# Patient Record
Sex: Female | Born: 1975 | Race: White | Hispanic: No | Smoking: Former smoker
Health system: Southern US, Community
[De-identification: ages and names within clinical notes are randomized; demographics above are authoritative.]

## PROBLEM LIST (undated history)

## (undated) DIAGNOSIS — J45909 Unspecified asthma, uncomplicated: Secondary | ICD-10-CM

## (undated) DIAGNOSIS — R011 Cardiac murmur, unspecified: Secondary | ICD-10-CM

## (undated) DIAGNOSIS — Z87442 Personal history of urinary calculi: Secondary | ICD-10-CM

## (undated) DIAGNOSIS — F32A Depression, unspecified: Secondary | ICD-10-CM

## (undated) DIAGNOSIS — Z8719 Personal history of other diseases of the digestive system: Secondary | ICD-10-CM

## (undated) DIAGNOSIS — R0600 Dyspnea, unspecified: Secondary | ICD-10-CM

## (undated) DIAGNOSIS — M1990 Unspecified osteoarthritis, unspecified site: Secondary | ICD-10-CM

## (undated) DIAGNOSIS — R519 Headache, unspecified: Secondary | ICD-10-CM

## (undated) DIAGNOSIS — C801 Malignant (primary) neoplasm, unspecified: Secondary | ICD-10-CM

## (undated) DIAGNOSIS — I209 Angina pectoris, unspecified: Secondary | ICD-10-CM

## (undated) DIAGNOSIS — D649 Anemia, unspecified: Secondary | ICD-10-CM

## (undated) DIAGNOSIS — K219 Gastro-esophageal reflux disease without esophagitis: Secondary | ICD-10-CM

## (undated) DIAGNOSIS — F4310 Post-traumatic stress disorder, unspecified: Secondary | ICD-10-CM

## (undated) DIAGNOSIS — M797 Fibromyalgia: Secondary | ICD-10-CM

## (undated) DIAGNOSIS — J189 Pneumonia, unspecified organism: Secondary | ICD-10-CM

## (undated) DIAGNOSIS — IMO0002 Reserved for concepts with insufficient information to code with codable children: Secondary | ICD-10-CM

## (undated) DIAGNOSIS — F419 Anxiety disorder, unspecified: Secondary | ICD-10-CM

## (undated) DIAGNOSIS — I82409 Acute embolism and thrombosis of unspecified deep veins of unspecified lower extremity: Secondary | ICD-10-CM

## (undated) DIAGNOSIS — M329 Systemic lupus erythematosus, unspecified: Secondary | ICD-10-CM

## (undated) HISTORY — DX: Anemia, unspecified: D64.9

## (undated) HISTORY — PX: TONSILLECTOMY: SUR1361

## (undated) HISTORY — DX: Malignant (primary) neoplasm, unspecified: C80.1

## (undated) HISTORY — PX: TUBAL LIGATION: SHX77

---

## 2006-06-03 ENCOUNTER — Emergency Department: Payer: Self-pay | Admitting: Internal Medicine

## 2006-06-24 ENCOUNTER — Emergency Department: Payer: Self-pay | Admitting: Emergency Medicine

## 2006-06-29 ENCOUNTER — Emergency Department: Payer: Self-pay | Admitting: Emergency Medicine

## 2006-10-06 ENCOUNTER — Emergency Department: Payer: Self-pay | Admitting: Emergency Medicine

## 2006-10-11 ENCOUNTER — Emergency Department: Payer: Self-pay | Admitting: Emergency Medicine

## 2006-12-19 ENCOUNTER — Observation Stay: Payer: Self-pay | Admitting: Obstetrics and Gynecology

## 2007-01-23 ENCOUNTER — Observation Stay: Payer: Self-pay | Admitting: Obstetrics and Gynecology

## 2007-02-10 ENCOUNTER — Observation Stay: Payer: Self-pay

## 2007-05-24 ENCOUNTER — Emergency Department: Payer: Self-pay | Admitting: Emergency Medicine

## 2008-01-19 ENCOUNTER — Emergency Department: Payer: Self-pay | Admitting: Emergency Medicine

## 2008-03-26 ENCOUNTER — Emergency Department: Payer: Self-pay | Admitting: Emergency Medicine

## 2008-07-04 ENCOUNTER — Emergency Department: Payer: Self-pay | Admitting: Emergency Medicine

## 2009-12-09 ENCOUNTER — Emergency Department: Payer: Self-pay | Admitting: Emergency Medicine

## 2010-08-15 ENCOUNTER — Emergency Department: Payer: Self-pay | Admitting: Emergency Medicine

## 2011-06-24 ENCOUNTER — Emergency Department: Payer: Self-pay | Admitting: Emergency Medicine

## 2011-12-17 ENCOUNTER — Emergency Department: Payer: Self-pay | Admitting: Emergency Medicine

## 2012-01-18 ENCOUNTER — Emergency Department: Payer: Self-pay

## 2012-01-18 LAB — PREGNANCY, URINE: Pregnancy Test, Urine: NEGATIVE m[IU]/mL

## 2012-01-18 LAB — COMPREHENSIVE METABOLIC PANEL
Albumin: 3.6 g/dL (ref 3.4–5.0)
Alkaline Phosphatase: 49 U/L — ABNORMAL LOW (ref 50–136)
Anion Gap: 13 (ref 7–16)
BUN: 8 mg/dL (ref 7–18)
Bilirubin,Total: 0.5 mg/dL (ref 0.2–1.0)
Calcium, Total: 9.1 mg/dL (ref 8.5–10.1)
Chloride: 107 mmol/L (ref 98–107)
Co2: 24 mmol/L (ref 21–32)
Creatinine: 0.94 mg/dL (ref 0.60–1.30)
EGFR (African American): >60
EGFR (Non-African Amer.): >60
Glucose: 78 mg/dL (ref 65–99)
Osmolality: 284 (ref 275–301)
Potassium: 3.9 mmol/L (ref 3.5–5.1)
SGOT(AST): 21 U/L (ref 15–37)
SGPT (ALT): 22 U/L
Sodium: 144 mmol/L (ref 136–145)
Total Protein: 7 g/dL (ref 6.4–8.2)

## 2012-01-18 LAB — URINALYSIS, COMPLETE
Bilirubin,UR: NEGATIVE
Glucose,UR: NEGATIVE mg/dL (ref 0–75)
Ketone: NEGATIVE
Nitrite: NEGATIVE
Ph: 5 (ref 4.5–8.0)
Protein: NEGATIVE
RBC,UR: 12 /HPF (ref 0–5)
Specific Gravity: 1.017 (ref 1.003–1.030)
Squamous Epithelial: 5
WBC UR: 6 /HPF (ref 0–5)

## 2012-01-18 LAB — TROPONIN I: Troponin-I: <0.02 ng/mL

## 2012-01-18 LAB — CBC
HCT: 42.3 % (ref 35.0–47.0)
HGB: 14.1 g/dL (ref 12.0–16.0)
MCH: 29.9 pg (ref 26.0–34.0)
MCHC: 33.4 g/dL (ref 32.0–36.0)
MCV: 90 fL (ref 80–100)
Platelet: 291 10*3/uL (ref 150–440)
RBC: 4.71 10*6/uL (ref 3.80–5.20)
RDW: 13.9 % (ref 11.5–14.5)
WBC: 9.3 10*3/uL (ref 3.6–11.0)

## 2012-01-18 LAB — TSH: Thyroid Stimulating Horm: 0.927 u[IU]/mL

## 2012-02-02 ENCOUNTER — Emergency Department: Payer: Self-pay | Admitting: Emergency Medicine

## 2012-03-26 ENCOUNTER — Emergency Department: Payer: Self-pay | Admitting: *Deleted

## 2012-07-30 ENCOUNTER — Emergency Department: Payer: Self-pay | Admitting: *Deleted

## 2013-10-03 ENCOUNTER — Emergency Department: Payer: Self-pay | Admitting: Internal Medicine

## 2014-03-25 ENCOUNTER — Emergency Department: Payer: Self-pay | Admitting: Emergency Medicine

## 2014-03-25 LAB — COMPREHENSIVE METABOLIC PANEL
Albumin: 3.7 g/dL (ref 3.4–5.0)
Alkaline Phosphatase: 58 U/L
Anion Gap: 10 (ref 7–16)
BUN: 11 mg/dL (ref 7–18)
Bilirubin,Total: 0.9 mg/dL (ref 0.2–1.0)
Calcium, Total: 9 mg/dL (ref 8.5–10.1)
Chloride: 104 mmol/L (ref 98–107)
Co2: 26 mmol/L (ref 21–32)
Creatinine: 1.01 mg/dL (ref 0.60–1.30)
EGFR (African American): >60
EGFR (Non-African Amer.): >60
Glucose: 89 mg/dL (ref 65–99)
Osmolality: 278 (ref 275–301)
Potassium: 3.2 mmol/L — ABNORMAL LOW (ref 3.5–5.1)
SGOT(AST): 13 U/L — ABNORMAL LOW (ref 15–37)
SGPT (ALT): 13 U/L (ref 12–78)
Sodium: 140 mmol/L (ref 136–145)
Total Protein: 7.5 g/dL (ref 6.4–8.2)

## 2014-03-25 LAB — URINALYSIS, COMPLETE
Bacteria: NONE SEEN
Bilirubin,UR: NEGATIVE
Glucose,UR: NEGATIVE mg/dL (ref 0–75)
Nitrite: NEGATIVE
Ph: 6 (ref 4.5–8.0)
Protein: 30
RBC,UR: 149 /HPF (ref 0–5)
Specific Gravity: 1.019 (ref 1.003–1.030)
Squamous Epithelial: 1
WBC UR: 3 /HPF (ref 0–5)

## 2014-03-25 LAB — CBC
HCT: 44.3 % (ref 35.0–47.0)
HGB: 14.7 g/dL (ref 12.0–16.0)
MCV: 92 fL (ref 80–100)
Platelet: 287 10*3/uL (ref 150–440)
RBC: 4.82 10*6/uL (ref 3.80–5.20)
WBC: 14.7 10*3/uL — ABNORMAL HIGH (ref 3.6–11.0)

## 2014-04-08 ENCOUNTER — Emergency Department: Payer: Self-pay | Admitting: Emergency Medicine

## 2014-04-08 LAB — CBC
HCT: 42.8 % (ref 35.0–47.0)
HGB: 14.3 g/dL (ref 12.0–16.0)
MCH: 30.7 pg (ref 26.0–34.0)
MCHC: 33.3 g/dL (ref 32.0–36.0)
MCV: 92 fL (ref 80–100)
Platelet: 330 10*3/uL (ref 150–440)
RBC: 4.65 10*6/uL (ref 3.80–5.20)
RDW: 14 % (ref 11.5–14.5)
WBC: 8.2 10*3/uL (ref 3.6–11.0)

## 2014-04-08 LAB — URINALYSIS, COMPLETE
Bilirubin,UR: NEGATIVE
Glucose,UR: NEGATIVE mg/dL (ref 0–75)
Ketone: NEGATIVE
Nitrite: NEGATIVE
Ph: 5 (ref 4.5–8.0)
Protein: NEGATIVE
RBC,UR: 179 /HPF (ref 0–5)
Specific Gravity: 1.021 (ref 1.003–1.030)
Squamous Epithelial: 21
WBC UR: 13 /HPF (ref 0–5)

## 2014-04-08 LAB — BASIC METABOLIC PANEL
Anion Gap: 4 — ABNORMAL LOW (ref 7–16)
BUN: 9 mg/dL (ref 7–18)
Calcium, Total: 8.9 mg/dL (ref 8.5–10.1)
Chloride: 107 mmol/L (ref 98–107)
Co2: 29 mmol/L (ref 21–32)
Creatinine: 1.14 mg/dL (ref 0.60–1.30)
EGFR (African American): >60
EGFR (Non-African Amer.): >60
Glucose: 105 mg/dL — ABNORMAL HIGH (ref 65–99)
Osmolality: 278 (ref 275–301)
Potassium: 3.4 mmol/L — ABNORMAL LOW (ref 3.5–5.1)
Sodium: 140 mmol/L (ref 136–145)

## 2014-04-08 LAB — PREGNANCY, URINE: Pregnancy Test, Urine: NEGATIVE m[IU]/mL

## 2014-10-05 ENCOUNTER — Emergency Department: Payer: Self-pay | Admitting: Emergency Medicine

## 2015-02-11 ENCOUNTER — Emergency Department
Admit: 2015-02-11 | Disposition: A | Discharge status: Home / Self Care | Payer: Self-pay | Admitting: Emergency Medicine

## 2015-02-11 LAB — COMPREHENSIVE METABOLIC PANEL
Albumin: 3.9 g/dL
Alkaline Phosphatase: 40 U/L
Anion Gap: 7 (ref 7–16)
BUN: 8 mg/dL
Bilirubin,Total: 1 mg/dL
Calcium, Total: 8.9 mg/dL
Chloride: 105 mmol/L
Co2: 27 mmol/L
Creatinine: 0.79 mg/dL
EGFR (African American): >60
EGFR (Non-African Amer.): >60
Glucose: 96 mg/dL
Potassium: 3.3 mmol/L — ABNORMAL LOW
SGOT(AST): 17 U/L
SGPT (ALT): 10 U/L — ABNORMAL LOW
Sodium: 139 mmol/L
Total Protein: 7 g/dL

## 2015-02-11 LAB — URINALYSIS, COMPLETE
Bacteria: NONE SEEN
Bilirubin,UR: NEGATIVE
Blood: NEGATIVE
Glucose,UR: NEGATIVE mg/dL (ref 0–75)
Ketone: NEGATIVE
Leukocyte Esterase: NEGATIVE
Nitrite: NEGATIVE
Ph: 6 (ref 4.5–8.0)
Protein: NEGATIVE
RBC,UR: 1 /HPF (ref 0–5)
Specific Gravity: 1.011 (ref 1.003–1.030)
Squamous Epithelial: 4
WBC UR: 5 /HPF (ref 0–5)

## 2015-02-11 LAB — CBC WITH DIFFERENTIAL/PLATELET
Basophil #: 0.1 10*3/uL (ref 0.0–0.1)
Basophil %: 0.8 %
Eosinophil #: 0.1 10*3/uL (ref 0.0–0.7)
Eosinophil %: 0.7 %
HCT: 41.6 % (ref 35.0–47.0)
HGB: 14 g/dL (ref 12.0–16.0)
Lymphocyte #: 2 10*3/uL (ref 1.0–3.6)
Lymphocyte %: 23 %
MCH: 30.1 pg (ref 26.0–34.0)
MCHC: 33.5 g/dL (ref 32.0–36.0)
MCV: 90 fL (ref 80–100)
Monocyte #: 0.7 x10 3/mm (ref 0.2–0.9)
Monocyte %: 7.6 %
Neutrophil #: 5.8 10*3/uL (ref 1.4–6.5)
Neutrophil %: 67.9 %
Platelet: 310 10*3/uL (ref 150–440)
RBC: 4.63 10*6/uL (ref 3.80–5.20)
RDW: 13.9 % (ref 11.5–14.5)
WBC: 8.6 10*3/uL (ref 3.6–11.0)

## 2015-02-11 LAB — PREGNANCY, URINE: Pregnancy Test, Urine: NEGATIVE m[IU]/mL

## 2015-03-06 ENCOUNTER — Emergency Department
Admit: 2015-03-06 | Disposition: A | Discharge status: Home / Self Care | Payer: Self-pay | Admitting: Emergency Medicine

## 2015-03-06 LAB — CBC WITH DIFFERENTIAL/PLATELET
Basophil #: 0.1 10*3/uL (ref 0.0–0.1)
Basophil %: 0.9 %
Eosinophil #: 0 10*3/uL (ref 0.0–0.7)
Eosinophil %: 0.5 %
HCT: 42.4 % (ref 35.0–47.0)
HGB: 14.1 g/dL (ref 12.0–16.0)
Lymphocyte #: 0.5 10*3/uL — ABNORMAL LOW (ref 1.0–3.6)
Lymphocyte %: 9.3 %
MCH: 29.7 pg (ref 26.0–34.0)
MCHC: 33.1 g/dL (ref 32.0–36.0)
MCV: 90 fL (ref 80–100)
Monocyte #: 0.4 x10 3/mm (ref 0.2–0.9)
Monocyte %: 7.6 %
Neutrophil #: 4.8 10*3/uL (ref 1.4–6.5)
Neutrophil %: 81.7 %
Platelet: 252 10*3/uL (ref 150–440)
RBC: 4.73 10*6/uL (ref 3.80–5.20)
RDW: 14.1 % (ref 11.5–14.5)
WBC: 5.8 10*3/uL (ref 3.6–11.0)

## 2015-03-06 LAB — COMPREHENSIVE METABOLIC PANEL
Albumin: 4.2 g/dL
Alkaline Phosphatase: 46 U/L
Anion Gap: 9 (ref 7–16)
BUN: 5 mg/dL — ABNORMAL LOW
Bilirubin,Total: 0.8 mg/dL
Calcium, Total: 8.8 mg/dL — ABNORMAL LOW
Chloride: 107 mmol/L
Co2: 25 mmol/L
Creatinine: 0.89 mg/dL
EGFR (African American): >60
EGFR (Non-African Amer.): >60
Glucose: 83 mg/dL
Potassium: 3.2 mmol/L — ABNORMAL LOW
SGOT(AST): 15 U/L
SGPT (ALT): 14 U/L
Sodium: 141 mmol/L
Total Protein: 7.4 g/dL

## 2015-03-06 LAB — URINALYSIS, COMPLETE
Bacteria: NONE SEEN
Bilirubin,UR: NEGATIVE
Blood: NEGATIVE
Glucose,UR: NEGATIVE mg/dL (ref 0–75)
Ketone: NEGATIVE
Leukocyte Esterase: NEGATIVE
Nitrite: NEGATIVE
Ph: 7 (ref 4.5–8.0)
Protein: NEGATIVE
Specific Gravity: 1.012 (ref 1.003–1.030)

## 2015-03-06 LAB — ED INFLUENZA
Influenza A By PCR: POSITIVE
Influenza B By PCR: NEGATIVE

## 2015-05-03 ENCOUNTER — Emergency Department: Payer: Self-pay

## 2015-05-03 ENCOUNTER — Encounter: Payer: Self-pay | Admitting: Emergency Medicine

## 2015-05-03 ENCOUNTER — Emergency Department
Admission: EM | Admit: 2015-05-03 | Discharge: 2015-05-04 | Disposition: A | Discharge status: Home / Self Care | Payer: Self-pay | Attending: Emergency Medicine | Admitting: Emergency Medicine

## 2015-05-03 DIAGNOSIS — Y9389 Activity, other specified: Secondary | ICD-10-CM | POA: Insufficient documentation

## 2015-05-03 DIAGNOSIS — S66911A Strain of unspecified muscle, fascia and tendon at wrist and hand level, right hand, initial encounter: Secondary | ICD-10-CM | POA: Insufficient documentation

## 2015-05-03 DIAGNOSIS — S0012XA Contusion of left eyelid and periocular area, initial encounter: Secondary | ICD-10-CM | POA: Insufficient documentation

## 2015-05-03 DIAGNOSIS — W51XXXA Accidental striking against or bumped into by another person, initial encounter: Secondary | ICD-10-CM | POA: Insufficient documentation

## 2015-05-03 DIAGNOSIS — S0083XA Contusion of other part of head, initial encounter: Secondary | ICD-10-CM | POA: Insufficient documentation

## 2015-05-03 DIAGNOSIS — Y998 Other external cause status: Secondary | ICD-10-CM | POA: Insufficient documentation

## 2015-05-03 DIAGNOSIS — S2231XA Fracture of one rib, right side, initial encounter for closed fracture: Secondary | ICD-10-CM | POA: Insufficient documentation

## 2015-05-03 DIAGNOSIS — Y9289 Other specified places as the place of occurrence of the external cause: Secondary | ICD-10-CM | POA: Insufficient documentation

## 2015-05-03 DIAGNOSIS — S6991XA Unspecified injury of right wrist, hand and finger(s), initial encounter: Secondary | ICD-10-CM | POA: Insufficient documentation

## 2015-05-03 HISTORY — DX: Reserved for concepts with insufficient information to code with codable children: IMO0002

## 2015-05-03 HISTORY — DX: Unspecified asthma, uncomplicated: J45.909

## 2015-05-03 HISTORY — DX: Systemic lupus erythematosus, unspecified: M32.9

## 2015-05-03 MED ORDER — OXYCODONE-ACETAMINOPHEN 5-325 MG PO TABS
ORAL_TABLET | ORAL | Status: AC
  Administered 2015-05-03: 1 via ORAL
  Filled 2015-05-03: qty 1

## 2015-05-03 MED ORDER — OXYCODONE-ACETAMINOPHEN 5-325 MG PO TABS
1.0000 | ORAL_TABLET | Freq: Once | ORAL | Status: AC
Start: 2015-05-03 — End: 2015-05-03
  Administered 2015-05-03: 1 via ORAL

## 2015-05-03 NOTE — ED Notes (Signed)
Returned to room.
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
## 2015-05-03 NOTE — ED Notes (Signed)
Patient transported to CT 
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
## 2015-05-04 MED ORDER — OXYCODONE-ACETAMINOPHEN 5-325 MG PO TABS: 1.0000 | ORAL_TABLET | Freq: Four times a day (QID) | ORAL | Status: DC | PRN

## 2015-05-04 NOTE — Discharge Instructions (Signed)
You have nondisplaced rib fractures on the right. Your wrist x-ray and your facial CT are okay . No fractures.  Rib Fracture A rib fracture is a break or crack in one of the bones of the ribs. The ribs are a group of long, curved bones that wrap around your chest and attach to your spine. They protect your lungs and other organs in the chest cavity. A broken or cracked rib is often painful, but most do not cause other problems. Most rib fractures heal on their own over time. However, rib fractures can be more serious if multiple ribs are broken or if broken ribs move out of place and push against other structures. CAUSES   A direct blow to the chest. For example, this could happen during contact sports, a car accident, or a fall against a hard object.  Repetitive movements with high force, such as pitching a baseball or having severe coughing spells. SYMPTOMS   Pain when you breathe in or cough.  Pain when someone presses on the injured area. DIAGNOSIS  Your caregiver will perform a physical exam. Various imaging tests may be ordered to confirm the diagnosis and to look for related injuries. These tests may include a chest X-ray, computed tomography (CT), magnetic resonance imaging (MRI), or a bone scan. TREATMENT  Rib fractures usually heal on their own in 1-3 months. The longer healing period is often associated with a continued cough or other aggravating activities. During the healing period, pain control is very important. Medication is usually given to control pain. Hospitalization or surgery may be needed for more severe injuries, such as those in which multiple ribs are broken or the ribs have moved out of place.  HOME CARE INSTRUCTIONS   Avoid strenuous activity and any activities or movements that cause pain. Be careful during activities and avoid bumping the injured rib.  Gradually increase activity as directed by your caregiver.  Only take over-the-counter or prescription  medications as directed by your caregiver. Do not take other medications without asking your caregiver first.  Apply ice to the injured area for the first 1-2 days after you have been treated or as directed by your caregiver. Applying ice helps to reduce inflammation and pain.  Put ice in a plastic bag.  Place a towel between your skin and the bag.   Leave the ice on for 15-20 minutes at a time, every 2 hours while you are awake.  Perform deep breathing as directed by your caregiver. This will help prevent pneumonia, which is a common complication of a broken rib. Your caregiver may instruct you to:  Take deep breaths several times a day.  Try to cough several times a day, holding a pillow against the injured area.  Use a device called an incentive spirometer to practice deep breathing several times a day.  Drink enough fluids to keep your urine clear or pale yellow. This will help you avoid constipation.   Do not wear a rib belt or binder. These restrict breathing, which can lead to pneumonia.  SEEK IMMEDIATE MEDICAL CARE IF:   You have a fever.   You have difficulty breathing or shortness of breath.   You develop a continual cough, or you cough up thick or bloody sputum.  You feel sick to your stomach (nausea), throw up (vomit), or have abdominal pain.   You have worsening pain not controlled with medications.  MAKE SURE YOU:  Understand these instructions.  Will watch your condition.  Will  get help right away if you are not doing well or get worse. Document Released: 10/31/2005 Document Revised: 07/03/2013 Document Reviewed: 01/02/2013 [facility] Patient Information 2015 [place], [place]. This information is not intended to replace advice given to you by your health care provider. Make sure you discuss any questions you have with your health care provider.

## 2015-05-05 ENCOUNTER — Emergency Department: Payer: Self-pay

## 2015-05-05 ENCOUNTER — Encounter: Payer: Self-pay | Admitting: Emergency Medicine

## 2015-05-05 ENCOUNTER — Emergency Department
Admission: EM | Admit: 2015-05-05 | Discharge: 2015-05-05 | Disposition: A | Discharge status: Home / Self Care | Payer: Self-pay | Attending: Emergency Medicine | Admitting: Emergency Medicine

## 2015-05-05 DIAGNOSIS — Y9289 Other specified places as the place of occurrence of the external cause: Secondary | ICD-10-CM | POA: Insufficient documentation

## 2015-05-05 DIAGNOSIS — S2231XD Fracture of one rib, right side, subsequent encounter for fracture with routine healing: Secondary | ICD-10-CM | POA: Insufficient documentation

## 2015-05-05 DIAGNOSIS — Y9389 Activity, other specified: Secondary | ICD-10-CM | POA: Insufficient documentation

## 2015-05-05 DIAGNOSIS — H1132 Conjunctival hemorrhage, left eye: Secondary | ICD-10-CM | POA: Insufficient documentation

## 2015-05-05 DIAGNOSIS — R52 Pain, unspecified: Secondary | ICD-10-CM

## 2015-05-05 DIAGNOSIS — S20212A Contusion of left front wall of thorax, initial encounter: Secondary | ICD-10-CM | POA: Insufficient documentation

## 2015-05-05 DIAGNOSIS — Y998 Other external cause status: Secondary | ICD-10-CM | POA: Insufficient documentation

## 2015-05-05 DIAGNOSIS — S0012XA Contusion of left eyelid and periocular area, initial encounter: Secondary | ICD-10-CM | POA: Insufficient documentation

## 2015-05-05 MED ORDER — IBUPROFEN 800 MG PO TABS: 800.0000 mg | ORAL_TABLET | Freq: Three times a day (TID) | ORAL | Status: DC | PRN

## 2015-05-05 MED ORDER — KETOROLAC TROMETHAMINE 60 MG/2ML IM SOLN
60.0000 mg | Freq: Once | INTRAMUSCULAR | Status: AC
  Administered 2015-05-05: 60 mg via INTRAMUSCULAR

## 2015-05-05 MED ORDER — OXYCODONE-ACETAMINOPHEN 5-325 MG PO TABS: 1.0000 | ORAL_TABLET | ORAL | Status: DC | PRN

## 2015-05-05 MED ORDER — KETOROLAC TROMETHAMINE 60 MG/2ML IM SOLN
INTRAMUSCULAR | Status: AC
  Administered 2015-05-05: 60 mg via INTRAMUSCULAR
  Filled 2015-05-05: qty 2
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
## 2015-05-05 NOTE — Discharge Instructions (Signed)
Rib Contusion °A rib contusion (bruise) can occur by a blow to the chest or by a fall against a hard object. Usually these will be much better in a couple weeks. If X-rays were taken today and there are no broken bones (fractures), the diagnosis of bruising is made. However, broken ribs may not show up for several days, or may be discovered later on a routine X-ray when signs of healing show up. If this happens to you, it does not mean that something was missed on the X-ray, but simply that it did not show up on the first X-rays. Earlier diagnosis will not usually change the treatment. °HOME CARE INSTRUCTIONS  °· Avoid strenuous activity. Be careful during activities and avoid bumping the injured ribs. Activities that pull on the injured ribs and cause pain should be avoided, if possible. °· For the first day or two, an ice pack used every 20 minutes while awake may be helpful. Put ice in a plastic bag and put a towel between the bag and the skin. °· Eat a normal, well-balanced diet. Drink plenty of fluids to avoid constipation. °· Take deep breaths several times a day to keep lungs free of infection. Try to cough several times a day. Splint the injured area with a pillow while coughing to ease pain. Coughing can help prevent pneumonia. °· Wear a rib belt or binder only if told to do so by your caregiver. If you are wearing a rib belt or binder, you must do the breathing exercises as directed by your caregiver. If not used properly, rib belts or binders restrict breathing which can lead to pneumonia. °· Only take over-the-counter or prescription medicines for pain, discomfort, or fever as directed by your caregiver. °SEEK MEDICAL CARE IF:  °· You or your child has an oral temperature above 102° F (38.9° C). °· Your baby is older than 3 months with a rectal temperature of 100.5° F (38.1° C) or higher for more than 1 day. °· You develop a cough, with thick or bloody sputum. °SEEK IMMEDIATE MEDICAL CARE IF:  °· You  have difficulty breathing. °· You feel sick to your stomach (nausea), have vomiting or belly (abdominal) pain. °· You have worsening pain, not controlled with medications, or there is a change in the location of the pain. °· You develop sweating or radiation of the pain into the arms, jaw or shoulders, or become light headed or faint. °· You or your child has an oral temperature above 102° F (38.9° C), not controlled by medicine. °· Your or your baby is older than 3 months with a rectal temperature of 102° F (38.9° C) or higher. °· Your baby is 3 months old or younger with a rectal temperature of 100.4° F (38° C) or higher. °MAKE SURE YOU:  °· Understand these instructions. °· Will watch your condition. °· Will get help right away if you are not doing well or get worse. °Document Released: 07/26/2001 Document Revised: 02/25/2013 Document Reviewed: 06/18/2008 °ExitCare® Patient Information ©2015 [employer] LLC. This information is not intended to replace advice given to you by your health care provider. Make sure you discuss any questions you have with your health care provider. ° °

## 2015-06-09 ENCOUNTER — Emergency Department
Admission: EM | Admit: 2015-06-09 | Discharge: 2015-06-10 | Disposition: A | Discharge status: Home / Self Care | Payer: Self-pay | Attending: Emergency Medicine | Admitting: Emergency Medicine

## 2015-06-09 ENCOUNTER — Encounter: Payer: Self-pay | Admitting: Emergency Medicine

## 2015-06-09 DIAGNOSIS — Y9289 Other specified places as the place of occurrence of the external cause: Secondary | ICD-10-CM | POA: Insufficient documentation

## 2015-06-09 DIAGNOSIS — Y9389 Activity, other specified: Secondary | ICD-10-CM | POA: Insufficient documentation

## 2015-06-09 DIAGNOSIS — Y998 Other external cause status: Secondary | ICD-10-CM | POA: Insufficient documentation

## 2015-06-09 DIAGNOSIS — W231XXA Caught, crushed, jammed, or pinched between stationary objects, initial encounter: Secondary | ICD-10-CM | POA: Insufficient documentation

## 2015-06-09 DIAGNOSIS — S60221A Contusion of right hand, initial encounter: Secondary | ICD-10-CM | POA: Insufficient documentation
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
## 2015-06-10 ENCOUNTER — Other Ambulatory Visit: Payer: Self-pay

## 2015-06-10 ENCOUNTER — Emergency Department: Payer: Self-pay

## 2015-06-10 MED ORDER — IBUPROFEN 800 MG PO TABS
800.0000 mg | ORAL_TABLET | Freq: Once | ORAL | Status: AC
  Administered 2015-06-10: 800 mg via ORAL

## 2015-06-10 MED ORDER — OXYCODONE-ACETAMINOPHEN 5-325 MG PO TABS
ORAL_TABLET | ORAL | Status: AC
  Administered 2015-06-10: 1 via ORAL
  Filled 2015-06-10: qty 1

## 2015-06-10 MED ORDER — IBUPROFEN 800 MG PO TABS
ORAL_TABLET | ORAL | Status: AC
  Administered 2015-06-10: 800 mg via ORAL
  Filled 2015-06-10: qty 1

## 2015-06-10 MED ORDER — OXYCODONE-ACETAMINOPHEN 5-325 MG PO TABS
1.0000 | ORAL_TABLET | Freq: Once | ORAL | Status: AC
  Administered 2015-06-10: 1 via ORAL

## 2015-06-10 NOTE — Discharge Instructions (Signed)
Contusion °A contusion is a deep bruise. Contusions are the result of an injury that caused bleeding under the skin. The contusion may turn blue, purple, or yellow. Minor injuries will give you a painless contusion, but more severe contusions may stay painful and swollen for a few weeks.  °CAUSES  °A contusion is usually caused by a blow, trauma, or direct force to an area of the body. °SYMPTOMS  °· Swelling and redness of the injured area. °· Bruising of the injured area. °· Tenderness and soreness of the injured area. °· Pain. °DIAGNOSIS  °The diagnosis can be made by taking a history and physical exam. An X-ray, CT scan, or MRI may be needed to determine if there were any associated injuries, such as fractures. °TREATMENT  °Specific treatment will depend on what area of the body was injured. In general, the best treatment for a contusion is resting, icing, elevating, and applying cold compresses to the injured area. Over-the-counter medicines may also be recommended for pain control. Ask your caregiver what the best treatment is for your contusion. °HOME CARE INSTRUCTIONS  °· Put ice on the injured area. °¨ Put ice in a plastic bag. °¨ Place a towel between your skin and the bag. °¨ Leave the ice on for 15-20 minutes, 3-4 times a day, or as directed by your health care provider. °· Only take over-the-counter or prescription medicines for pain, discomfort, or fever as directed by your caregiver. Your caregiver may recommend avoiding anti-inflammatory medicines (aspirin, ibuprofen, and naproxen) for 48 hours because these medicines may increase bruising. °· Rest the injured area. °· If possible, elevate the injured area to reduce swelling. °SEEK IMMEDIATE MEDICAL CARE IF:  °· You have increased bruising or swelling. °· You have pain that is getting worse. °· Your swelling or pain is not relieved with medicines. °MAKE SURE YOU:  °· Understand these instructions. °· Will watch your condition. °· Will get help right  away if you are not doing well or get worse. °Document Released: 08/10/2005 Document Revised: 11/05/2013 Document Reviewed: 09/05/2011 °ExitCare® Patient Information ©2015 Exit[employer], LLC. This information is not intended to replace advice given to you by your health care provider. Make sure you discuss any questions you have with your health care provider. ° °

## 2015-06-10 NOTE — ED Provider Notes (Signed)
[facility] Emergency Department Provider Note  ____________________________________________  Time seen: 12:05 AM  I have reviewed the triage vital signs and the nursing notes.   HISTORY  Chief Complaint Hand Pain      HPI Monica Gonzalez is a 39 y.o. female presents with history of accidentally shutting car door on her right hand today. Patient admits to 7 out of 10 pain at the site with swelling and bruising.     Past Medical History  Diagnosis Date  . Asthma   . Lupus     There are no active problems to display for this patient.   Past Surgical History  Procedure Laterality Date  . Tubal ligation    . Tonsillectomy      Current Outpatient Rx  Name  Route  Sig  Dispense  Refill  . ibuprofen (ADVIL,MOTRIN) 800 MG tablet   Oral   Take 1 tablet (800 mg total) by mouth every 8 (eight) hours as needed.   30 tablet   0   . oxyCODONE-acetaminophen (ROXICET) 5-325 MG per tablet   Oral   Take 1-2 tablets by mouth every 4 (four) hours as needed for severe pain.   15 tablet   0     Allergies Prednisone  No family history on file.  Social History History  Substance Use Topics  . Smoking status: Never Smoker   . Smokeless tobacco: Not on file  . Alcohol Use: Yes    Review of Systems  Constitutional: Negative for fever. Eyes: Negative for visual changes. ENT: Negative for sore throat. Cardiovascular: Negative for chest pain. Respiratory: Negative for shortness of breath. Gastrointestinal: Negative for abdominal pain, vomiting and diarrhea. Genitourinary: Negative for dysuria. Musculoskeletal: Negative for back pain. Positive for right hand pain Skin: Negative for rash. Neurological: Negative for headaches, focal weakness or numbness.   10-point ROS otherwise negative.  ____________________________________________   PHYSICAL EXAM:  VITAL SIGNS: ED Triage Vitals  Enc Vitals Group     BP 06/09/15 2333 110/75 mmHg   Pulse Rate 06/09/15 2333 72     Resp 06/09/15 2333 20     Temp 06/09/15 2333 98 F (36.7 C)     Temp Source 06/09/15 2333 Oral     SpO2 06/09/15 2333 100 %     Weight 06/09/15 2333 147 lb (66.679 kg)     Height 06/09/15 2333 4\' 11"  (1.499 m)     Head Cir --      Peak Flow --      Pain Score 06/09/15 2334 4     Pain Loc --      Pain Edu? --      Excl. in GC? --     Constitutional: Alert and oriented. Well appearing and in no distress. Eyes: Conjunctivae are normal. PERRL. Normal extraocular movements. ENT   Head: Normocephalic and atraumatic.   Nose: No congestion/rhinnorhea.   Mouth/Throat: Mucous membranes are moist.   Neck: No stridor. Cardiovascular: Normal rate, regular rhythm. Normal and symmetric distal pulses are present in all extremities. No murmurs, rubs, or gallops. Respiratory: Normal respiratory effort without tachypnea nor retractions. Breath sounds are clear and equal bilaterally. No wheezes/rales/rhonchi. Gastrointestinal: Soft and nontender. No distention. There is no CVA tenderness. Genitourinary: deferred Musculoskeletal: Positive pain and swelling with ecchymosis to the dorsal aspect of the right hand Neurologic:  Normal speech and language. No gross focal neurologic deficits are appreciated. Speech is normal.  Skin:  Skin is warm, dry and intact. No  rash noted. Psychiatric: Mood and affect are normal. Speech and behavior are normal. Patient exhibits appropriate insight and judgment.     EKG      RADIOLOGY  Right hand x-ray revealed: DG Hand Complete Right (Final result) Result time: 06/10/15 01:09:45   Final result by Rad Results In Interface (06/10/15 01:09:45)   Narrative:   CLINICAL DATA: Pain after injury. Shut hand in car door, now with pain. Pain in the region of the metacarpal phalangeal joint.  EXAM: RIGHT HAND - COMPLETE 3+ VIEW  COMPARISON: Wrist radiographs 05/03/2015  FINDINGS: Comminuted fracture of the distal  ulnar metaphysis. Question early surrounding callus formation. Remote fracture of the ulnar styloid unchanged. Chronic deformity of the distal radius is unchanged. No definite additional fracture of the hand. Artifact noted about the index and middle finger, not repeated per request.  IMPRESSION: 1. Comminuted fracture of the distal ulnar metaphysis. Is question overlies surrounding callus formation, suggesting this may be subacute. 2. No additional acute fracture of the hand.   Electronically Signed By: Rubye Oaks M.D. On: 06/10/2015 01:09        INITIAL IMPRESSION / ASSESSMENT AND PLAN / ED COURSE  Pertinent labs & imaging results that were available during my care of the patient were reviewed by me and considered in my medical decision making (see chart for details).    ____________________________________________   FINAL CLINICAL IMPRESSION(S) / ED DIAGNOSES  Final diagnoses:  Hand contusion, right, initial encounter      Darci Current, MD 06/10/15 [phone]

## 2015-08-15 ENCOUNTER — Encounter: Payer: Self-pay | Admitting: Emergency Medicine

## 2015-08-15 ENCOUNTER — Emergency Department
Admission: EM | Admit: 2015-08-15 | Discharge: 2015-08-15 | Disposition: A | Discharge status: Home / Self Care | Payer: Self-pay | Attending: Emergency Medicine | Admitting: Emergency Medicine

## 2015-08-15 DIAGNOSIS — Z3202 Encounter for pregnancy test, result negative: Secondary | ICD-10-CM | POA: Insufficient documentation

## 2015-08-15 DIAGNOSIS — R51 Headache: Secondary | ICD-10-CM | POA: Insufficient documentation

## 2015-08-15 DIAGNOSIS — R197 Diarrhea, unspecified: Secondary | ICD-10-CM | POA: Insufficient documentation

## 2015-08-15 DIAGNOSIS — R112 Nausea with vomiting, unspecified: Secondary | ICD-10-CM | POA: Insufficient documentation

## 2015-08-15 DIAGNOSIS — H53149 Visual discomfort, unspecified: Secondary | ICD-10-CM | POA: Insufficient documentation

## 2015-08-15 DIAGNOSIS — R519 Headache, unspecified: Secondary | ICD-10-CM

## 2015-08-15 LAB — COMPREHENSIVE METABOLIC PANEL
ALT: 13 U/L — ABNORMAL LOW (ref 14–54)
AST: 20 U/L (ref 15–41)
Albumin: 3.5 g/dL (ref 3.5–5.0)
Alkaline Phosphatase: 38 U/L (ref 38–126)
Anion gap: 3 — ABNORMAL LOW (ref 5–15)
BUN: 8 mg/dL (ref 6–20)
CO2: 28 mmol/L (ref 22–32)
Calcium: 8.4 mg/dL — ABNORMAL LOW (ref 8.9–10.3)
Chloride: 110 mmol/L (ref 101–111)
Creatinine, Ser: 0.94 mg/dL (ref 0.44–1.00)
GFR calc Af Amer: >60 mL/min (ref >60)
GFR calc non Af Amer: >60 mL/min (ref >60)
Glucose, Bld: 92 mg/dL (ref 65–99)
Potassium: 3.8 mmol/L (ref 3.5–5.1)
Sodium: 141 mmol/L (ref 135–145)
Total Bilirubin: 0.7 mg/dL (ref 0.3–1.2)
Total Protein: 6.1 g/dL — ABNORMAL LOW (ref 6.5–8.1)

## 2015-08-15 LAB — URINALYSIS COMPLETE WITH MICROSCOPIC (ARMC ONLY)
Bacteria, UA: NONE SEEN
Bilirubin Urine: NEGATIVE
Glucose, UA: NEGATIVE mg/dL
Ketones, ur: NEGATIVE mg/dL
Leukocytes, UA: NEGATIVE
Nitrite: NEGATIVE
Protein, ur: NEGATIVE mg/dL
Specific Gravity, Urine: 1.023 (ref 1.005–1.030)
pH: 6 (ref 5.0–8.0)

## 2015-08-15 LAB — CBC
HCT: 40.3 % (ref 35.0–47.0)
Hemoglobin: 13.1 g/dL (ref 12.0–16.0)
MCH: 28.8 pg (ref 26.0–34.0)
MCHC: 32.6 g/dL (ref 32.0–36.0)
MCV: 88.3 fL (ref 80.0–100.0)
Platelets: 306 10*3/uL (ref 150–440)
RBC: 4.56 MIL/uL (ref 3.80–5.20)
RDW: 13.7 % (ref 11.5–14.5)
WBC: 7.1 10*3/uL (ref 3.6–11.0)

## 2015-08-15 LAB — LIPASE, BLOOD: Lipase: 26 U/L (ref 22–51)

## 2015-08-15 LAB — PREGNANCY, URINE: Preg Test, Ur: NEGATIVE

## 2015-08-15 MED ORDER — PROCHLORPERAZINE EDISYLATE 5 MG/ML IJ SOLN
10.0000 mg | Freq: Once | INTRAMUSCULAR | Status: AC
  Administered 2015-08-15: 10 mg via INTRAVENOUS
  Filled 2015-08-15: qty 2

## 2015-08-15 MED ORDER — KETOROLAC TROMETHAMINE 30 MG/ML IJ SOLN
30.0000 mg | Freq: Once | INTRAMUSCULAR | Status: AC
  Administered 2015-08-15: 30 mg via INTRAVENOUS
  Filled 2015-08-15: qty 1

## 2015-08-15 MED ORDER — METOCLOPRAMIDE HCL 10 MG PO TABS: 10.0000 mg | ORAL_TABLET | Freq: Four times a day (QID) | ORAL | Status: DC | PRN

## 2015-08-15 MED ORDER — SODIUM CHLORIDE 0.9 % IV BOLUS (SEPSIS)
1000.0000 mL | Freq: Once | INTRAVENOUS | Status: AC
  Administered 2015-08-15: 1000 mL via INTRAVENOUS

## 2015-08-15 MED ORDER — BUTALBITAL-APAP-CAFFEINE 50-325-40 MG PO TABS: 1.0000 | ORAL_TABLET | Freq: Four times a day (QID) | ORAL | Status: AC | PRN

## 2015-08-15 NOTE — ED Notes (Signed)
Pt to ed with c/o headache, vomiting and diarrhea that started about 6 am today.  Pt alert and oriented, reports she has vomited x 7 today, diarrhea x 4 today.  Pt skin warm and dry.  Pt drinking soda at triage.

## 2015-08-15 NOTE — ED Notes (Signed)
Pt c/o Nausea and vomiting with onset of headache with mild sensitivity to light. States headache is unchanged with tylenol and motrin. Constant and throbbing.

## 2015-08-15 NOTE — ED Provider Notes (Signed)
[facility] Emergency Department Provider Note  ____________________________________________  Time seen: Approximately 7 PM  I have reviewed the triage vital signs and the nursing notes.   HISTORY  Chief Complaint Emesis; Diarrhea; and Headache    HPI Monica Gonzalez is a 39 y.o. female with a history of lupus who is presenting today with nausea, vomiting diarrhea and a headache. She says that she has vomited about 7 times and 4 episodes of diarrhea. She denies any blood in her vomit or diarrhea. Says that she is also having a left-sided headache which she describes as throbbing and tight. She describes photophobia with this. Says that the headache started small this morning and is increased to a 5- 6 out of 10.Said she has tried Tylenol as well as ibuprofen earlier today for this without any relief. Says that she has multiple sick contacts at work. Denies any abdominal pain. No pain with urination.   Past Medical History  Diagnosis Date  . Asthma   . Lupus (HCC)     There are no active problems to display for this patient.   Past Surgical History  Procedure Laterality Date  . Tubal ligation    . Tonsillectomy      Current Outpatient Rx  Name  Route  Sig  Dispense  Refill  . ibuprofen (ADVIL,MOTRIN) 800 MG tablet   Oral   Take 1 tablet (800 mg total) by mouth every 8 (eight) hours as needed.   30 tablet   0   . oxyCODONE-acetaminophen (ROXICET) 5-325 MG per tablet   Oral   Take 1-2 tablets by mouth every 4 (four) hours as needed for severe pain.   15 tablet   0     Allergies Prednisone  Family History  Problem Relation Age of Onset  . Diabetes Mother   . Congestive Heart Failure Mother     Social History Social History  Substance Use Topics  . Smoking status: Never Smoker   . Smokeless tobacco: None  . Alcohol Use: Yes    Review of Systems Constitutional: No fever/chills Eyes: No visual changes. ENT: No sore  throat. Cardiovascular: Denies chest pain. Respiratory: Denies shortness of breath. Gastrointestinal: No abdominal pain.   No constipation. Genitourinary: Negative for dysuria. Musculoskeletal: Negative for back pain. Skin: Negative for rash. Neurological: Negative for headaches, focal weakness or numbness.  10-point ROS otherwise negative.  ____________________________________________   PHYSICAL EXAM:  VITAL SIGNS: ED Triage Vitals  Enc Vitals Group     BP 08/15/15 1457 117/58 mmHg     Pulse Rate 08/15/15 1457 66     Resp 08/15/15 1457 20     Temp 08/15/15 1457 98.5 F (36.9 C)     Temp Source 08/15/15 1457 Oral     SpO2 08/15/15 1457 99 %     Weight 08/15/15 1457 146 lb (66.225 kg)     Height 08/15/15 1457  (1.499 m)     Head Cir --      Peak Flow --      Pain Score 08/15/15 1458 5     Pain Loc --      Pain Edu? --      Excl. in GC? --     Constitutional: Alert and oriented. Well appearing and in no acute distress. Eyes: Conjunctivae are normal. PERRL. EOMI. Head: Atraumatic. Nose: No congestion/rhinnorhea. Mouth/Throat: Mucous membranes are moist.  Oropharynx non-erythematous. Neck: No stridor.   Cardiovascular: Normal rate, regular rhythm. Grossly normal heart sounds.  Good peripheral circulation. Respiratory: Normal respiratory effort.  No retractions. Lungs CTAB. Gastrointestinal: Soft and nontender. No distention. No abdominal bruits. No CVA tenderness. Musculoskeletal: No lower extremity tenderness nor edema.  No joint effusions. Neurologic:  Normal speech and language. No gross focal neurologic deficits are appreciated. No gait instability. Skin:  Skin is warm, dry and intact. No rash noted. Psychiatric: Mood and affect are normal. Speech and behavior are normal.  ____________________________________________   LABS (all labs ordered are listed, but only abnormal results are displayed)  Labs Reviewed  COMPREHENSIVE METABOLIC PANEL - Abnormal;  Notable for the following:    Calcium 8.4 (*)    Total Protein 6.1 (*)    ALT 13 (*)    Anion gap 3 (*)    All other components within normal limits  URINALYSIS COMPLETEWITH MICROSCOPIC ([facility] ONLY) - Abnormal; Notable for the following:    Color, Urine YELLOW (*)    APPearance CLEAR (*)    Hgb urine dipstick 2+ (*)    Squamous Epithelial / LPF 0-5 (*)    All other components within normal limits  LIPASE, BLOOD  CBC  PREGNANCY, URINE   ____________________________________________  EKG   ____________________________________________  RADIOLOGY   ____________________________________________   PROCEDURES    ____________________________________________   INITIAL IMPRESSION / ASSESSMENT AND PLAN / ED COURSE  Pertinent labs & imaging results that were available during my care of the patient were reviewed by me and considered in my medical decision making (see chart for details).  ----------------------------------------- 9:10 PM on 08/15/2015 -----------------------------------------  Patient is resting comfortably at this time and says that her headache pain is down to 2 out of 10. She says she is feeling well enough to go home at this time. Has not had any nausea or vomiting during her stay in the emergency department exam room. We'll discharge to home with Reglan as well as Fioricet. I feel that the etiology of her symptoms is viral. Will likely resolve over the next several days. Requesting work note. We'll give working. Unlikely to be intracranial hemorrhage. The patient had gradually increasing symptoms that did not start as a sudden onset, thunderclap quality. Does not of any meningismus on exam. No neurologic deficits. ____________________________________________   FINAL CLINICAL IMPRESSION(S) / ED DIAGNOSES  Acute nausea and vomiting. Acute headache. Initial visit.    Myrna Blazer, MD 08/15/15 2111

## 2015-08-15 NOTE — Discharge Instructions (Signed)
Nausea and Vomiting Nausea means you feel sick to your stomach. Throwing up (vomiting) is a reflex where stomach contents come out of your mouth. HOME CARE   Take medicine as told by your doctor.  Do not force yourself to eat. However, you do need to drink fluids.  If you feel like eating, eat a normal diet as told by your doctor.  Eat rice, wheat, potatoes, bread, lean meats, yogurt, fruits, and vegetables.  Avoid high-fat foods.  Drink enough fluids to keep your pee (urine) clear or pale yellow.  Ask your doctor how to replace body fluid losses (rehydrate). Signs of body fluid loss (dehydration) include:  Feeling very thirsty.  Dry lips and mouth.  Feeling dizzy.  Dark pee.  Peeing less than normal.  Feeling confused.  Fast breathing or heart rate. GET HELP RIGHT AWAY IF:   You have blood in your throw up.  You have black or bloody poop (stool).  You have a bad headache or stiff neck.  You feel confused.  You have bad belly (abdominal) pain.  You have chest pain or trouble breathing.  You do not pee at least once every 8 hours.  You have cold, clammy skin.  You keep throwing up after 24 to 48 hours.  You have a fever. MAKE SURE YOU:   Understand these instructions.  Will watch your condition.  Will get help right away if you are not doing well or get worse. Document Released: 04/18/2008 Document Revised: 01/23/2012 Document Reviewed: 04/01/2011 [facility] Patient Information 2015 [place], [place]. This information is not intended to replace advice given to you by your health care provider. Make sure you discuss any questions you have with your health care provider.  Headaches, Frequently Asked Questions MIGRAINE HEADACHES Q: What is migraine? What causes it? How can I treat it? A: Generally, migraine headaches begin as a dull ache. Then they develop into a constant, throbbing, and pulsating pain. You may experience pain at the temples. You may  experience pain at the front or back of one or both sides of the head. The pain is usually accompanied by a combination of:  Nausea.  Vomiting.  Sensitivity to light and noise. Some people (about 15%) experience an aura (see below) before an attack. The cause of migraine is believed to be chemical reactions in the brain. Treatment for migraine may include over-the-counter or prescription medications. It may also include self-help techniques. These include relaxation training and biofeedback.  Q: What is an aura? A: About 15% of people with migraine get an "aura". This is a sign of neurological symptoms that occur before a migraine headache. You may see wavy or jagged lines, dots, or flashing lights. You might experience tunnel vision or blind spots in one or both eyes. The aura can include visual or auditory hallucinations (something imagined). It may include disruptions in smell (such as strange odors), taste or touch. Other symptoms include:  Numbness.  A "pins and needles" sensation.  Difficulty in recalling or speaking the correct word. These neurological events may last as long as 60 minutes. These symptoms will fade as the headache begins. Q: What is a trigger? A: Certain physical or environmental factors can lead to or "trigger" a migraine. These include:  Foods.  Hormonal changes.  Weather.  Stress. It is important to remember that triggers are different for everyone. To help prevent migraine attacks, you need to figure out which triggers affect you. Keep a headache diary. This is a good way  to track triggers. The diary will help you talk to your healthcare professional about your condition. Q: Does weather affect migraines? A: Bright sunshine, hot, humid conditions, and drastic changes in barometric pressure may lead to, or "trigger," a migraine attack in some people. But studies have shown that weather does not act as a trigger for everyone with migraines. Q: What is the link  between migraine and hormones? A: Hormones start and regulate many of your body's functions. Hormones keep your body in balance within a constantly changing environment. The levels of hormones in your body are unbalanced at times. Examples are during menstruation, pregnancy, or menopause. That can lead to a migraine attack. In fact, about three quarters of all women with migraine report that their attacks are related to the menstrual cycle.  Q: Is there an increased risk of stroke for migraine sufferers? A: The likelihood of a migraine attack causing a stroke is very remote. That is not to say that migraine sufferers cannot have a stroke associated with their migraines. In persons under age 21, the most common associated factor for stroke is migraine headache. But over the course of a person's normal life span, the occurrence of migraine headache may actually be associated with a reduced risk of dying from cerebrovascular disease due to stroke.  Q: What are acute medications for migraine? A: Acute medications are used to treat the pain of the headache after it has started. Examples over-the-counter medications, NSAIDs, ergots, and triptans.  Q: What are the triptans? A: Triptans are the newest class of abortive medications. They are specifically targeted to treat migraine. Triptans are vasoconstrictors. They moderate some chemical reactions in the brain. The triptans work on receptors in your brain. Triptans help to restore the balance of a neurotransmitter called serotonin. Fluctuations in levels of serotonin are thought to be a main cause of migraine.  Q: Are over-the-counter medications for migraine effective? A: Over-the-counter, or "OTC," medications may be effective in relieving mild to moderate pain and associated symptoms of migraine. But you should see your caregiver before beginning any treatment regimen for migraine.  Q: What are preventive medications for migraine? A: Preventive medications  for migraine are sometimes referred to as "prophylactic" treatments. They are used to reduce the frequency, severity, and length of migraine attacks. Examples of preventive medications include antiepileptic medications, antidepressants, beta-blockers, calcium channel blockers, and NSAIDs (nonsteroidal anti-inflammatory drugs). Q: Why are anticonvulsants used to treat migraine? A: During the past few years, there has been an increased interest in antiepileptic drugs for the prevention of migraine. They are sometimes referred to as "anticonvulsants". Both epilepsy and migraine may be caused by similar reactions in the brain.  Q: Why are antidepressants used to treat migraine? A: Antidepressants are typically used to treat people with depression. They may reduce migraine frequency by regulating chemical levels, such as serotonin, in the brain.  Q: What alternative therapies are used to treat migraine? A: The term "alternative therapies" is often used to describe treatments considered outside the scope of conventional Western medicine. Examples of alternative therapy include acupuncture, acupressure, and yoga. Another common alternative treatment is herbal therapy. Some herbs are believed to relieve headache pain. Always discuss alternative therapies with your caregiver before proceeding. Some herbal products contain arsenic and other toxins. TENSION HEADACHES Q: What is a tension-type headache? What causes it? How can I treat it? A: Tension-type headaches occur randomly. They are often the result of temporary stress, anxiety, fatigue, or anger. Symptoms include soreness in  your temples, a tightening band-like sensation around your head (a "vice-like" ache). Symptoms can also include a pulling feeling, pressure sensations, and contracting head and neck muscles. The headache begins in your forehead, temples, or the back of your head and neck. Treatment for tension-type headache may include over-the-counter or  prescription medications. Treatment may also include self-help techniques such as relaxation training and biofeedback. CLUSTER HEADACHES Q: What is a cluster headache? What causes it? How can I treat it? A: Cluster headache gets its name because the attacks come in groups. The pain arrives with little, if any, warning. It is usually on one side of the head. A tearing or bloodshot eye and a runny nose on the same side of the headache may also accompany the pain. Cluster headaches are believed to be caused by chemical reactions in the brain. They have been described as the most severe and intense of any headache type. Treatment for cluster headache includes prescription medication and oxygen. SINUS HEADACHES Q: What is a sinus headache? What causes it? How can I treat it? A: When a cavity in the bones of the face and skull (a sinus) becomes inflamed, the inflammation will cause localized pain. This condition is usually the result of an allergic reaction, a tumor, or an infection. If your headache is caused by a sinus blockage, such as an infection, you will probably have a fever. An x-ray will confirm a sinus blockage. Your caregiver's treatment might include antibiotics for the infection, as well as antihistamines or decongestants.  REBOUND HEADACHES Q: What is a rebound headache? What causes it? How can I treat it? A: A pattern of taking acute headache medications too often can lead to a condition known as "rebound headache." A pattern of taking too much headache medication includes taking it more than 2 days per week or in excessive amounts. That means more than the label or a caregiver advises. With rebound headaches, your medications not only stop relieving pain, they actually begin to cause headaches. Doctors treat rebound headache by tapering the medication that is being overused. Sometimes your caregiver will gradually substitute a different type of treatment or medication. Stopping may be a  challenge. Regularly overusing a medication increases the potential for serious side effects. Consult a caregiver if you regularly use headache medications more than 2 days per week or more than the label advises. ADDITIONAL QUESTIONS AND ANSWERS Q: What is biofeedback? A: Biofeedback is a self-help treatment. Biofeedback uses special equipment to monitor your body's involuntary physical responses. Biofeedback monitors:  Breathing.  Pulse.  Heart rate.  Temperature.  Muscle tension.  Brain activity. Biofeedback helps you refine and perfect your relaxation exercises. You learn to control the physical responses that are related to stress. Once the technique has been mastered, you do not need the equipment any more. Q: Are headaches hereditary? A: Four out of five (80%) of people that suffer report a family history of migraine. Scientists are not sure if this is genetic or a family predisposition. Despite the uncertainty, a child has a 50% chance of having migraine if one parent suffers. The child has a 75% chance if both parents suffer.  Q: Can children get headaches? A: By the time they reach high school, most young people have experienced some type of headache. Many safe and effective approaches or medications can prevent a headache from occurring or stop it after it has begun.  Q: What type of doctor should I see to diagnose and treat my headache?  A: Start with your primary caregiver. Discuss his or her experience and approach to headaches. Discuss methods of classification, diagnosis, and treatment. Your caregiver may decide to recommend you to a headache specialist, depending upon your symptoms or other physical conditions. Having diabetes, allergies, etc., may require a more comprehensive and inclusive approach to your headache. The National Head[employer] Foundation will provide, upon request, a list of [facility] physician members in your state. Document Released: 01/21/2004 Document Revised:  01/23/2012 Document Reviewed: 06/30/2008 [facility] Patient Information 2015 [place], [place]. This information is not intended to replace advice given to you by your health care provider. Make sure you discuss any questions you have with your health care provider.

## 2016-04-12 ENCOUNTER — Emergency Department: Payer: PRIVATE HEALTH INSURANCE

## 2016-04-12 ENCOUNTER — Emergency Department
Admission: EM | Admit: 2016-04-12 | Discharge: 2016-04-12 | Disposition: A | Discharge status: Home / Self Care | Payer: PRIVATE HEALTH INSURANCE | Attending: Emergency Medicine | Admitting: Emergency Medicine

## 2016-04-12 DIAGNOSIS — S0993XA Unspecified injury of face, initial encounter: Secondary | ICD-10-CM | POA: Diagnosis present

## 2016-04-12 DIAGNOSIS — Y939 Activity, unspecified: Secondary | ICD-10-CM | POA: Diagnosis not present

## 2016-04-12 DIAGNOSIS — J45909 Unspecified asthma, uncomplicated: Secondary | ICD-10-CM | POA: Diagnosis not present

## 2016-04-12 DIAGNOSIS — L0201 Cutaneous abscess of face: Secondary | ICD-10-CM | POA: Diagnosis not present

## 2016-04-12 DIAGNOSIS — S0083XA Contusion of other part of head, initial encounter: Secondary | ICD-10-CM | POA: Diagnosis not present

## 2016-04-12 DIAGNOSIS — R220 Localized swelling, mass and lump, head: Secondary | ICD-10-CM | POA: Diagnosis not present

## 2016-04-12 DIAGNOSIS — W010XXA Fall on same level from slipping, tripping and stumbling without subsequent striking against object, initial encounter: Secondary | ICD-10-CM | POA: Insufficient documentation

## 2016-04-12 DIAGNOSIS — Y999 Unspecified external cause status: Secondary | ICD-10-CM | POA: Insufficient documentation

## 2016-04-12 DIAGNOSIS — Y929 Unspecified place or not applicable: Secondary | ICD-10-CM | POA: Diagnosis not present

## 2016-04-12 DIAGNOSIS — W19XXXA Unspecified fall, initial encounter: Secondary | ICD-10-CM

## 2016-04-12 MED ORDER — CLINDAMYCIN HCL 150 MG PO CAPS
300.0000 mg | ORAL_CAPSULE | Freq: Once | ORAL | Status: AC
  Administered 2016-04-12: 300 mg via ORAL
  Filled 2016-04-12: qty 2

## 2016-04-12 MED ORDER — CLINDAMYCIN HCL 300 MG PO CAPS: 300.0000 mg | ORAL_CAPSULE | Freq: Three times a day (TID) | ORAL | Status: DC

## 2016-04-12 MED ORDER — TRAMADOL HCL 50 MG PO TABS: 50.0000 mg | ORAL_TABLET | Freq: Four times a day (QID) | ORAL | Status: DC | PRN

## 2016-04-12 MED ORDER — KETOROLAC TROMETHAMINE 60 MG/2ML IM SOLN
60.0000 mg | Freq: Once | INTRAMUSCULAR | Status: AC
  Administered 2016-04-12: 60 mg via INTRAMUSCULAR
  Filled 2016-04-12: qty 2

## 2016-04-12 NOTE — Discharge Instructions (Signed)
Contusion A contusion is a deep bruise. Contusions are the result of a blunt injury to tissues and muscle fibers under the skin. The injury causes bleeding under the skin. The skin overlying the contusion may turn blue, purple, or yellow. Minor injuries will give you a painless contusion, but more severe contusions may stay painful and swollen for a few weeks.  CAUSES  This condition is usually caused by a blow, trauma, or direct force to an area of the body. SYMPTOMS  Symptoms of this condition include:  Swelling of the injured area.  Pain and tenderness in the injured area.  Discoloration. The area may have redness and then turn blue, purple, or yellow. DIAGNOSIS  This condition is diagnosed based on a physical exam and medical history. An X-ray, CT scan, or MRI may be needed to determine if there are any associated injuries, such as broken bones (fractures). TREATMENT  Specific treatment for this condition depends on what area of the body was injured. In general, the best treatment for a contusion is resting, icing, applying pressure to (compression), and elevating the injured area. This is often called the RICE strategy. Over-the-counter anti-inflammatory medicines may also be recommended for pain control.  HOME CARE INSTRUCTIONS   Rest the injured area.  If directed, apply ice to the injured area:  Put ice in a plastic bag.  Place a towel between your skin and the bag.  Leave the ice on for 20 minutes, 2-3 times per day.  If directed, apply light compression to the injured area using an elastic bandage. Make sure the bandage is not wrapped too tightly. Remove and reapply the bandage as directed by your health care provider.  If possible, raise (elevate) the injured area above the level of your heart while you are sitting or lying down.  Take over-the-counter and prescription medicines only as told by your health care provider. SEEK MEDICAL CARE IF:  Your symptoms do not  improve after several days of treatment.  Your symptoms get worse.  You have difficulty moving the injured area. SEEK IMMEDIATE MEDICAL CARE IF:   You have severe pain.  You have numbness in a hand or foot.  Your hand or foot turns pale or cold.   This information is not intended to replace advice given to you by your health care provider. Make sure you discuss any questions you have with your health care provider.   Document Released: 08/10/2005 Document Revised: 07/22/2015 Document Reviewed: 03/18/2015 Elsevier Interactive Patient Education 2016 [employer]  Abscess An abscess is an infected area that contains a collection of pus and debris.It can occur in almost any part of the body. An abscess is also known as a furuncle or boil. CAUSES  An abscess occurs when tissue gets infected. This can occur from blockage of oil or sweat glands, infection of hair follicles, or a minor injury to the skin. As the body tries to fight the infection, pus collects in the area and creates pressure under the skin. This pressure causes pain. People with weakened immune systems have difficulty fighting infections and get certain abscesses more often.  SYMPTOMS Usually an abscess develops on the skin and becomes a painful mass that is red, warm, and tender. If the abscess forms under the skin, you may feel a moveable soft area under the skin. Some abscesses break open (rupture) on their own, but most will continue to get worse without care. The infection can spread deeper into the body and eventually into  the bloodstream, causing you to feel ill.  DIAGNOSIS  Your caregiver will take your medical history and perform a physical exam. A sample of fluid may also be taken from the abscess to determine what is causing your infection. TREATMENT  Your caregiver may prescribe antibiotic medicines to fight the infection. However, taking antibiotics alone usually does not cure an abscess. Your caregiver may need to  make a small cut (incision) in the abscess to drain the pus. In some cases, gauze is packed into the abscess to reduce pain and to continue draining the area. HOME CARE INSTRUCTIONS   Only take over-the-counter or prescription medicines for pain, discomfort, or fever as directed by your caregiver.  If you were prescribed antibiotics, take them as directed. Finish them even if you start to feel better.  If gauze is used, follow your caregiver's directions for changing the gauze.  To avoid spreading the infection:  Keep your draining abscess covered with a bandage.  Wash your hands well.  Do not share personal care items, towels, or whirlpools with others.  Avoid skin contact with others.  Keep your skin and clothes clean around the abscess.  Keep all follow-up appointments as directed by your caregiver. SEEK MEDICAL CARE IF:   You have increased pain, swelling, redness, fluid drainage, or bleeding.  You have muscle aches, chills, or a general ill feeling.  You have a fever. MAKE SURE YOU:   Understand these instructions.  Will watch your condition.  Will get help right away if you are not doing well or get worse.   This information is not intended to replace advice given to you by your health care provider. Make sure you discuss any questions you have with your health care provider.   Document Released: 08/10/2005 Document Revised: 05/01/2012 Document Reviewed: 01/13/2012 Elsevier Interactive Patient Education 2016 [employer]  Facial or Scalp Contusion A facial or scalp contusion is a deep bruise on the face or head. Injuries to the face and head generally cause a lot of swelling, especially around the eyes. Contusions are the result of an injury that caused bleeding under the skin. The contusion may turn blue, purple, or yellow. Minor injuries will give you a painless contusion, but more severe contusions may stay painful and swollen for a few weeks.  CAUSES  A facial  or scalp contusion is caused by a blunt injury or trauma to the face or head area.  SIGNS AND SYMPTOMS   Swelling of the injured area.   Discoloration of the injured area.   Tenderness, soreness, or pain in the injured area.  DIAGNOSIS  The diagnosis can be made by taking a medical history and doing a physical exam. An X-ray exam, CT scan, or MRI may be needed to determine if there are any associated injuries, such as broken bones (fractures). TREATMENT  Often, the best treatment for a facial or scalp contusion is applying cold compresses to the injured area. Over-the-counter medicines may also be recommended for pain control.  HOME CARE INSTRUCTIONS   Only take over-the-counter or prescription medicines as directed by your health care provider.   Apply ice to the injured area.   Put ice in a plastic bag.   Place a towel between your skin and the bag.   Leave the ice on for 20 minutes, 2-3 times a day.  SEEK MEDICAL CARE IF:  You have bite problems.   You have pain with chewing.   You are concerned about facial defects.  SEEK IMMEDIATE MEDICAL CARE IF:  You have severe pain or a headache that is not relieved by medicine.   You have unusual sleepiness, confusion, or personality changes.   You throw up (vomit).   You have a persistent nosebleed.   You have double vision or blurred vision.   You have fluid drainage from your nose or ear.   You have difficulty walking or using your arms or legs.  MAKE SURE YOU:   Understand these instructions.  Will watch your condition.  Will get help right away if you are not doing well or get worse.   This information is not intended to replace advice given to you by your health care provider. Make sure you discuss any questions you have with your health care provider.   Document Released: 12/08/2004 Document Revised: 11/21/2014 Document Reviewed: 06/13/2013 Elsevier Interactive Patient Education [employer].

## 2016-04-12 NOTE — ED Provider Notes (Signed)
[facility] Emergency Department Provider Note   ____________________________________________  Time seen: Approximately 249 AM  I have reviewed the triage vital signs and the nursing notes.   HISTORY  Chief Complaint Fall and Facial Injury    HPI Monica Gonzalez is a 40 y.o. female who comes to the hospital today with some left-sided facial swelling. The patient reports that she fell 2 days ago and has had some swelling to the left side of her face. She reports it has not been getting better. The patient was wearing flip flops and tripped over them. She's been taking Tylenol and ibuprofen for pain but the pain is also not better. She is having some neck pain and dizzy spells with standing. The patient is been trying to eat trying to drink but she has not been eating and drinking as normal. She reports that she also has a strange taste in her mouth from the left side of her face. She is been putting ice on her face and he are negative but again that has not been helping. The patient's pain is currently a 9 out of 10 in intensity. The patient drove here for evaluation.   Past Medical History  Diagnosis Date  . Asthma   . Lupus (HCC)     There are no active problems to display for this patient.   Past Surgical History  Procedure Laterality Date  . Tubal ligation    . Tonsillectomy      Current Outpatient Rx  Name  Route  Sig  Dispense  Refill  . butalbital-acetaminophen-caffeine (FIORICET) 50-325-40 MG tablet   Oral   Take 1-2 tablets by mouth every 6 (six) hours as needed for headache.   20 tablet   0   . clindamycin (CLEOCIN) 300 MG capsule   Oral   Take 1 capsule (300 mg total) by mouth 3 (three) times daily.   30 capsule   0   . ibuprofen (ADVIL,MOTRIN) 800 MG tablet   Oral   Take 1 tablet (800 mg total) by mouth every 8 (eight) hours as needed.   30 tablet   0   . metoCLOPramide (REGLAN) 10 MG tablet   Oral   Take 1 tablet (10 mg  total) by mouth every 6 (six) hours as needed for nausea or vomiting.   12 tablet   0   . oxyCODONE-acetaminophen (ROXICET) 5-325 MG per tablet   Oral   Take 1-2 tablets by mouth every 4 (four) hours as needed for severe pain.   15 tablet   0   . traMADol (ULTRAM) 50 MG tablet   Oral   Take 1 tablet (50 mg total) by mouth every 6 (six) hours as needed.   12 tablet   0     Allergies Prednisone  Family History  Problem Relation Age of Onset  . Diabetes Mother   . Congestive Heart Failure Mother     Social History Social History  Substance Use Topics  . Smoking status: Never Smoker   . Smokeless tobacco: None  . Alcohol Use: Yes    Review of Systems Constitutional: No fever/chills Eyes: No visual changes. ENT: Left-sided facial swelling Cardiovascular: Denies chest pain. Respiratory: Denies shortness of breath. Gastrointestinal: No abdominal pain.  No nausea, no vomiting.  No diarrhea.  No constipation. Genitourinary: Negative for dysuria. Musculoskeletal: Neck pain Skin: Negative for rash. Neurological: Negative for headaches, focal weakness or numbness.  10-point ROS otherwise negative.  ____________________________________________  PHYSICAL EXAM:  VITAL SIGNS: ED Triage Vitals  Enc Vitals Group     BP 04/12/16 0015 127/71 mmHg     Pulse Rate 04/12/16 0015 72     Resp 04/12/16 0015 20     Temp 04/12/16 0015 98.5 F (36.9 C)     Temp Source 04/12/16 0015 Oral     SpO2 04/12/16 0015 100 %     Weight 04/12/16 0015 170 lb (77.111 kg)     Height 04/12/16 0015  (1.499 m)     Head Cir --      Peak Flow --      Pain Score 04/12/16 0017 9     Pain Loc --      Pain Edu? --      Excl. in GC? --     Constitutional: Alert and oriented. Well appearing and in Moderate distress. Eyes: Conjunctivae are normal. PERRL. EOMI. Head: Bruising around the left eye with some swelling around the patient's left mouth. Nose: No  congestion/rhinnorhea. Mouth/Throat: Soft tissue swelling to the left mouth with a small healing laceration on the internal mucosa and some purulent appearing drainage. Neck: cervical spine tenderness to palpation. Cardiovascular: Normal rate, regular rhythm. Grossly normal heart sounds.  Good peripheral circulation. Respiratory: Normal respiratory effort.  No retractions. Lungs CTAB. Gastrointestinal: Soft and nontender. No distention. Positive bowel sounds Musculoskeletal: No lower extremity tenderness nor edema.   Neurologic:  Normal speech and language. Skin:  Skin is warm, dry and intact.  Psychiatric: Mood and affect are normal.   ____________________________________________   LABS (all labs ordered are listed, but only abnormal results are displayed)  Labs Reviewed - No data to display ____________________________________________  EKG  ED ECG REPORT I, Rebecka Apley, the attending physician, personally viewed and interpreted this ECG.   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  This is a 40 year old female who comes into the hospital today with some left-sided facial swelling  after a fall 2 days ago. The patient did have a knot to the left side of her face. When I looked inside her mouth it does appear she has a laceration that is having some purulent drainage. I did attempt to drain the remaining fluid from the area until no more returned. I'll gave the patient a dose of clindamycin as well as a shot of Toradol. The patient will be discharged to home to follow-up with the acute care clinic. The patient should return with any worsening symptoms, fever, vomiting. ____________________________________________   FINAL CLINICAL IMPRESSION(S) / ED DIAGNOSES  Final diagnoses:  Facial contusion, initial encounter  Facial abscess      NEW MEDICATIONS STARTED DURING THIS VISIT:  Discharge Medication List as of 04/12/2016  4:19 AM    START taking these medications   Details  clindamycin (CLEOCIN) 300 MG capsule Take  1 capsule (300 mg total) by mouth 3 (three) times daily., Starting 04/12/2016, Until Discontinued, Print    traMADol (ULTRAM) 50 MG tablet Take 1 tablet (50 mg total) by mouth every 6 (six) hours as needed., Starting 04/12/2016, Until Discontinued, Print         Note:  This document was prepared using Dragon voice recognition software and may include unintentional dictation errors.    Rebecka ApleyAllison P Webster, MD 04/12/16 [phone]

## 2016-04-12 NOTE — ED Notes (Signed)
Fell on Saturday after tripping on her flip flops. Presents with left sided facial swelling and left arm pain. OTC pain meds not helping.

## 2016-04-12 NOTE — ED Notes (Signed)
MD at bedside at this time.

## 2016-04-13 ENCOUNTER — Emergency Department: Payer: PRIVATE HEALTH INSURANCE

## 2016-04-13 ENCOUNTER — Emergency Department
Admission: EM | Admit: 2016-04-13 | Discharge: 2016-04-13 | Disposition: A | Discharge status: Home / Self Care | Payer: PRIVATE HEALTH INSURANCE | Attending: Emergency Medicine | Admitting: Emergency Medicine

## 2016-04-13 ENCOUNTER — Encounter: Payer: Self-pay | Admitting: Emergency Medicine

## 2016-04-13 DIAGNOSIS — Z79899 Other long term (current) drug therapy: Secondary | ICD-10-CM | POA: Insufficient documentation

## 2016-04-13 DIAGNOSIS — J45909 Unspecified asthma, uncomplicated: Secondary | ICD-10-CM | POA: Diagnosis not present

## 2016-04-13 DIAGNOSIS — K122 Cellulitis and abscess of mouth: Secondary | ICD-10-CM | POA: Insufficient documentation

## 2016-04-13 DIAGNOSIS — M62838 Other muscle spasm: Secondary | ICD-10-CM | POA: Diagnosis not present

## 2016-04-13 DIAGNOSIS — M25512 Pain in left shoulder: Secondary | ICD-10-CM | POA: Diagnosis present

## 2016-04-13 MED ORDER — MAGIC MOUTHWASH: 5.0000 mL | Freq: Four times a day (QID) | ORAL | Status: DC | PRN

## 2016-04-13 MED ORDER — CYCLOBENZAPRINE HCL 10 MG PO TABS: 10.0000 mg | ORAL_TABLET | Freq: Three times a day (TID) | ORAL | Status: DC | PRN

## 2016-04-13 NOTE — ED Notes (Signed)
Pt reports falling on Friday, was seen for left shoulder/ arm pain and infection in mouth. Pt was placed on tramadol and antibiotics; pt reports she can still taste the drainage in her mouth.

## 2016-04-13 NOTE — Discharge Instructions (Signed)
Abscess An abscess is an infected area that contains a collection of pus and debris.It can occur in almost any part of the body. An abscess is also known as a furuncle or boil. CAUSES  An abscess occurs when tissue gets infected. This can occur from blockage of oil or sweat glands, infection of hair follicles, or a minor injury to the skin. As the body tries to fight the infection, pus collects in the area and creates pressure under the skin. This pressure causes pain. People with weakened immune systems have difficulty fighting infections and get certain abscesses more often.  SYMPTOMS Usually an abscess develops on the skin and becomes a painful mass that is red, warm, and tender. If the abscess forms under the skin, you may feel a moveable soft area under the skin. Some abscesses break open (rupture) on their own, but most will continue to get worse without care. The infection can spread deeper into the body and eventually into the bloodstream, causing you to feel ill.  DIAGNOSIS  Your caregiver will take your medical history and perform a physical exam. A sample of fluid may also be taken from the abscess to determine what is causing your infection. TREATMENT  Your caregiver may prescribe antibiotic medicines to fight the infection. However, taking antibiotics alone usually does not cure an abscess. Your caregiver may need to make a small cut (incision) in the abscess to drain the pus. In some cases, gauze is packed into the abscess to reduce pain and to continue draining the area. HOME CARE INSTRUCTIONS   Only take over-the-counter or prescription medicines for pain, discomfort, or fever as directed by your caregiver.  If you were prescribed antibiotics, take them as directed. Finish them even if you start to feel better.  If gauze is used, follow your caregiver's directions for changing the gauze.  To avoid spreading the infection:  Keep your draining abscess covered with a  bandage.  Wash your hands well.  Do not share personal care items, towels, or whirlpools with others.  Avoid skin contact with others.  Keep your skin and clothes clean around the abscess.  Keep all follow-up appointments as directed by your caregiver. SEEK MEDICAL CARE IF:   You have increased pain, swelling, redness, fluid drainage, or bleeding.  You have muscle aches, chills, or a general ill feeling.  You have a fever. MAKE SURE YOU:   Understand these instructions.  Will watch your condition.  Will get help right away if you are not doing well or get worse.   This information is not intended to replace advice given to you by your health care provider. Make sure you discuss any questions you have with your health care provider.   Document Released: 08/10/2005 Document Revised: 05/01/2012 Document Reviewed: 01/13/2012 Elsevier Interactive Patient Education 2016 Elsevier Inc.  Cryotherapy Cryotherapy is when you put ice on your injury. Ice helps lessen pain and puffiness (swelling) after an injury. Ice works the best when you start using it in the first 24 to 48 hours after an injury. HOME CARE  Put a dry or damp towel between the ice pack and your skin.  You may press gently on the ice pack.  Leave the ice on for no more than 10 to 20 minutes at a time.  Check your skin after 5 minutes to make sure your skin is okay.  Rest at least 20 minutes between ice pack uses.  Stop using ice when your skin loses feeling (numbness).  Do not use ice on someone who cannot tell you when it hurts. This includes small children and people with memory problems (dementia). GET HELP RIGHT AWAY IF:  You have white spots on your skin.  Your skin turns blue or pale.  Your skin feels waxy or hard.  Your puffiness gets worse. MAKE SURE YOU:   Understand these instructions.  Will watch your condition.  Will get help right away if you are not doing well or get worse.   This  information is not intended to replace advice given to you by your health care provider. Make sure you discuss any questions you have with your health care provider.   Document Released: 04/18/2008 Document Revised: 01/23/2012 Document Reviewed: 06/23/2011 Elsevier Interactive Patient Education 2016 Elsevier Inc.  Foot LockerHeat Therapy Heat therapy can help ease sore, stiff, injured, and tight muscles and joints. Heat relaxes your muscles, which may help ease your pain. Heat therapy should only be used on old, pre-existing, or long-lasting (chronic) injuries. Do not use heat therapy unless told by your doctor. HOW TO USE HEAT THERAPY There are several different kinds of heat therapy, including:  Moist heat pack.  Warm water bath.  Hot water bottle.  Electric heating pad.  Heated gel pack.  Heated wrap.  Electric heating pad. GENERAL HEAT THERAPY RECOMMENDATIONS   Do not sleep while using heat therapy. Only use heat therapy while you are awake.  Your skin may turn pink while using heat therapy. Do not use heat therapy if your skin turns red.  Do not use heat therapy if you have new pain.  High heat or long exposure to heat can cause burns. Be careful when using heat therapy to avoid burning your skin.  Do not use heat therapy on areas of your skin that are already irritated, such as with a rash or sunburn. GET HELP IF:   You have blisters, redness, swelling (puffiness), or numbness.  You have new pain.  Your pain is worse. MAKE SURE YOU:  Understand these instructions.  Will watch your condition.  Will get help right away if you are not doing well or get worse.   This information is not intended to replace advice given to you by your health care provider. Make sure you discuss any questions you have with your health care provider.   Document Released: 01/23/2012 Document Revised: 11/21/2014 Document Reviewed: 12/24/2013 Elsevier Interactive Patient Education 2016 Elsevier  Inc.  Joint Pain Joint pain, which is also called arthralgia, can be caused by many things. Joint pain often goes away when you follow your health care provider's instructions for relieving pain at home. However, joint pain can also be caused by conditions that require further treatment. Common causes of joint pain include:  Bruising in the area of the joint.  Overuse of the joint.  Wear and tear on the joints that occur with aging (osteoarthritis).  Various other forms of arthritis.  A buildup of a crystal form of uric acid in the joint (gout).  Infections of the joint (septic arthritis) or of the bone (osteomyelitis). Your health care provider may recommend medicine to help with the pain. If your joint pain continues, additional tests may be needed to diagnose your condition. HOME CARE INSTRUCTIONS Watch your condition for any changes. Follow these instructions as directed to lessen the pain that you are feeling.  Take medicines only as directed by your health care provider.  Rest the affected area for as long as your health care  provider says that you should. If directed to do so, raise the painful joint above the level of your heart while you are sitting or lying down.  Do not do things that cause or worsen pain.  If directed, apply ice to the painful area:  Put ice in a plastic bag.  Place a towel between your skin and the bag.  Leave the ice on for 20 minutes, 2-3 times per day.  Wear an elastic bandage, splint, or sling as directed by your health care provider. Loosen the elastic bandage or splint if your fingers or toes become numb and tingle, or if they turn cold and blue.  Begin exercising or stretching the affected area as directed by your health care provider. Ask your health care provider what types of exercise are safe for you.  Keep all follow-up visits as directed by your health care provider. This is important. SEEK MEDICAL CARE IF:  Your pain increases, and  medicine does not help.  Your joint pain does not improve within 3 days.  You have increased bruising or swelling.  You have a fever.  You lose 10 lb (4.5 kg) or more without trying. SEEK IMMEDIATE MEDICAL CARE IF:  You are not able to move the joint.  Your fingers or toes become numb or they turn cold and blue.   This information is not intended to replace advice given to you by your health care provider. Make sure you discuss any questions you have with your health care provider.   Document Released: 10/31/2005 Document Revised: 11/21/2014 Document Reviewed: 08/12/2014 Elsevier Interactive Patient Education 2016 Elsevier Inc.  Muscle Cramps and Spasms Muscle cramps and spasms are when muscles tighten by themselves. They usually get better within minutes. Muscle cramps are painful. They are usually stronger and last longer than muscle spasms. Muscle spasms may or may not be painful. They can last a few seconds or much longer. HOME CARE  Drink enough fluid to keep your pee (urine) clear or pale yellow.  Massage, stretch, and relax the muscle.  Use a warm towel, heating pad, or warm shower water on tight muscles.  Place ice on the muscle if it is tender or in pain.  Put ice in a plastic bag.  Place a towel between your skin and the bag.  Leave the ice on for 15-20 minutes, 03-04 times a day.  Only take medicine as told by your doctor. GET HELP RIGHT AWAY IF:  Your cramps or spasms get worse, happen more often, or do not get better with time. MAKE SURE YOU:  Understand these instructions.  Will watch your condition.  Will get help right away if you are not doing well or get worse.   This information is not intended to replace advice given to you by your health care provider. Make sure you discuss any questions you have with your health care provider.   Document Released: 10/13/2008 Document Revised: 02/25/2013 Document Reviewed: 10/17/2012 Elsevier Interactive  Patient Education [employer]

## 2016-04-13 NOTE — ED Provider Notes (Signed)
[facility] Emergency Department Provider Note  ____________________________________________  Time seen: Approximately 11:15 AM  I have reviewed the triage vital signs and the nursing notes.   HISTORY  Chief Complaint Shoulder Pain and Mouth Lesions    HPI Monica Gonzalez is a 40 y.o. female , NAD, presents to the ED with continued left shoulder pain and oral abscess. She was seen in this emergency department night before last after a fall that had occurred two days prior. At that time, she had negative head, maxillofacial and neck CTs. She was noted to have a laceration inside the left cheek that seems to have been oozing. Attending physician at that time attempted to release the drainage from the area and started the patient on clindamycin. Patient notes that she had left shoulder pain at the time of her initial visit but it was more concerned in regards to her head injury and notes that her left shoulder pain has been increasing over the last 36 hours. Patient has a she still has oozing about the lesion in her mouth but notes the pain has decreased since being on antibiotics.Her pain medication and was given at her last visit is not helping with her shoulder pain.. States it feels slightly tight about the left side of the neck. Has not had full range of motion of the left shoulder over the last 24 hours but has been able to use it without any decrease in grip strength. Denies any numbness, weakness, tingling. Has not had any further falls or injuries since her last visit. Denies any bruising, redness, swelling, skin sores, rash about the left shoulder or neck. Denies fevers, chills, body aches. Has not had any abdominal pain, nausea, vomiting.   Past Medical History  Diagnosis Date  . Asthma   . Lupus (HCC)     There are no active problems to display for this patient.   Past Surgical History  Procedure Laterality Date  . Tubal ligation    . Tonsillectomy       Current Outpatient Rx  Name  Route  Sig  Dispense  Refill  . butalbital-acetaminophen-caffeine (FIORICET) 50-325-40 MG tablet   Oral   Take 1-2 tablets by mouth every 6 (six) hours as needed for headache.   20 tablet   0   . clindamycin (CLEOCIN) 300 MG capsule   Oral   Take 1 capsule (300 mg total) by mouth 3 (three) times daily.   30 capsule   0   . cyclobenzaprine (FLEXERIL) 10 MG tablet   Oral   Take 1 tablet (10 mg total) by mouth 3 (three) times daily as needed for muscle spasms.   21 tablet   0   . ibuprofen (ADVIL,MOTRIN) 800 MG tablet   Oral   Take 1 tablet (800 mg total) by mouth every 8 (eight) hours as needed.   30 tablet   0   . magic mouthwash SOLN   Oral   Take 5 mLs by mouth 4 (four) times daily as needed for mouth pain. Please mix 60mL of each ingredient totaling   180 mL   0   . metoCLOPramide (REGLAN) 10 MG tablet   Oral   Take 1 tablet (10 mg total) by mouth every 6 (six) hours as needed for nausea or vomiting.   12 tablet   0   . oxyCODONE-acetaminophen (ROXICET) 5-325 MG per tablet   Oral   Take 1-2 tablets by mouth every 4 (four) hours as  needed for severe pain.   15 tablet   0   . traMADol (ULTRAM) 50 MG tablet   Oral   Take 1 tablet (50 mg total) by mouth every 6 (six) hours as needed.   12 tablet   0     Allergies Prednisone  Family History  Problem Relation Age of Onset  . Diabetes Mother   . Congestive Heart Failure Mother     Social History Social History  Substance Use Topics  . Smoking status: Never Smoker   . Smokeless tobacco: None  . Alcohol Use: Yes     Review of Systems  Constitutional: No fever/chills, fatigue Eyes: No visual changes.  ENT: Positive oral abscess. No sore throat. Cardiovascular: No chest pain. Respiratory: No cough. No shortness of breath. No wheezing.  Gastrointestinal: No abdominal pain.  No nausea, vomiting.   Musculoskeletal: Positive for left shoulder and neck pain.  Negative for back pain.  Skin: Negative for rash, bruising, redness, swelling. Neurological: Negative for headaches, focal weakness or numbness. No tingling. 10-point ROS otherwise negative.  ____________________________________________   PHYSICAL EXAM:  VITAL SIGNS: ED Triage Vitals  Enc Vitals Group     BP 04/13/16 1050 119/66 mmHg     Pulse Rate 04/13/16 1050 66     Resp 04/13/16 1050 16     Temp 04/13/16 1050 98.1 F (36.7 C)     Temp Source 04/13/16 1050 Oral     SpO2 04/13/16 1050 97 %     Weight 04/13/16 1050 170 lb (77.111 kg)     Height 04/13/16 1050 4\' 11"  (1.499 m)     Head Cir --      Peak Flow --      Pain Score 04/13/16 1050 10     Pain Loc --      Pain Edu? --      Excl. in GC? --      Constitutional: Alert and oriented. Well appearing and in no acute distress. Eyes: Conjunctivae are normal.  Head: Atraumatic. ENT:            Nose: No congestion/rhinnorhea.      Mouth/Throat: 1 cm annular open abscess noted about the left buccal mucosa that is actively draining. 1 mm erythematous ring surrounding the area. Area is easily expressed without significant pain to the patient. Mucous membranes are moist.  Neck: No stridor. No cervical spine tenderness to palpation. Supple with full range of motion. Pain to palpation about the left trapezius with mild muscle spasm noted. Hematological/Lymphatic/Immunilogical: No cervical lymphadenopathy. Cardiovascular: Normal rate, regular rhythm. Normal S1 and S2.  Good peripheral circulation with 2+ pulses noted in bilateral upper extremities. Respiratory: Normal respiratory effort without tachypnea or retractions. Lungs CTAB with breath sounds noted in all lung fields Musculoskeletal: Full range of motion of the right upper extremity. Left shoulder can be abducted to approximately 90 prior to onset of pain. No tenderness to palpation about the left clavicle, glenohumeral joint, scapular area. No AC joint tenderness. No lower  extremity tenderness nor edema.  No joint effusions. Neurologic:  Normal speech and language. No gross focal neurologic deficits are appreciated. Full grip strength of bilateral upper extremities are equal. Skin:  Skin is warm, dry and intact. No rash, bruising, redness, swelling noted. Psychiatric: Mood and affect are normal. Speech and behavior are normal. Patient exhibits appropriate insight and judgement.   ____________________________________________   LABS  None ____________________________________________  EKG  None ____________________________________________  RADIOLOGY I have personally viewed and  evaluated these images (plain radiographs) as part of my medical decision making, as well as reviewing the written report by the radiologist.  Dg Shoulder Left  04/13/2016  CLINICAL DATA:  Left shoulder pain following fall on stairs, initial encounter EXAM: LEFT SHOULDER - 2+ VIEW COMPARISON:  None. FINDINGS: There is no evidence of fracture or dislocation. There is no evidence of arthropathy or other focal bone abnormality. Soft tissues are unremarkable. IMPRESSION: No acute abnormality noted. Electronically Signed   By: Alcide CleverMark  Lukens M.D.   On: 04/13/2016 11:45    ____________________________________________    PROCEDURES  Procedure(s) performed: None    Medications - No data to display   ____________________________________________   INITIAL IMPRESSION / ASSESSMENT AND PLAN / ED COURSE  Pertinent imaging results that were available during my care of the patient were reviewed by me and considered in my medical decision making (see chart for details).  Patient's diagnosis is consistent with left shoulder arthralgia with left trapezial muscle spasm and abscess of buccal space of mouth. Patient will be discharged home with prescriptions for Magic mouthwash and Flexeril to take as directed. Patient is to continue clindamycin as previously directed and take to completion.  [facility]. Patient is given ED precautions to return to the ED for any worsening or new symptoms.      ____________________________________________  FINAL CLINICAL IMPRESSION(S) / ED DIAGNOSES  Final diagnoses:  Shoulder arthralgia, left  Muscle spasm  Abscess of buccal space of mouth      NEW MEDICATIONS STARTED DURING THIS VISIT:  Discharge Medication List as of 04/13/2016 12:12 PM    START taking these medications   Details  cyclobenzaprine (FLEXERIL) 10 MG tablet Take 1 tablet (10 mg total) by mouth 3 (three) times daily as needed for muscle spasms., Starting 04/13/2016, Until Discontinued, Print    magic mouthwash SOLN Take 5 mLs by mouth 4 (four) times daily as needed for mouth pain. Please mix 60mL of each ingredient totaling 180mL, Starting 04/13/2016, Until Discontinued, Print             Hope PigeonJami L Edrick Whitehorn, PA-C 04/13/16 1305  Sharyn CreamerMark Quale, MD 04/13/16 [phone]

## 2016-04-13 NOTE — ED Notes (Signed)
See triage by this RN

## 2016-04-24 IMAGING — CR DG WRIST COMPLETE 3+V*R*
1 series · 4 of 4 positions shown · non-contrast
Comparison: None.

CLINICAL DATA: Fall last night on to right wrist with wrist
deformity. Initial encounter.

EXAM:
RIGHT WRIST - COMPLETE 3+ VIEW

[Series 1: x wrist pa right · 0.14mm/px · 4 of 4 slices shown]
[im 1/4]
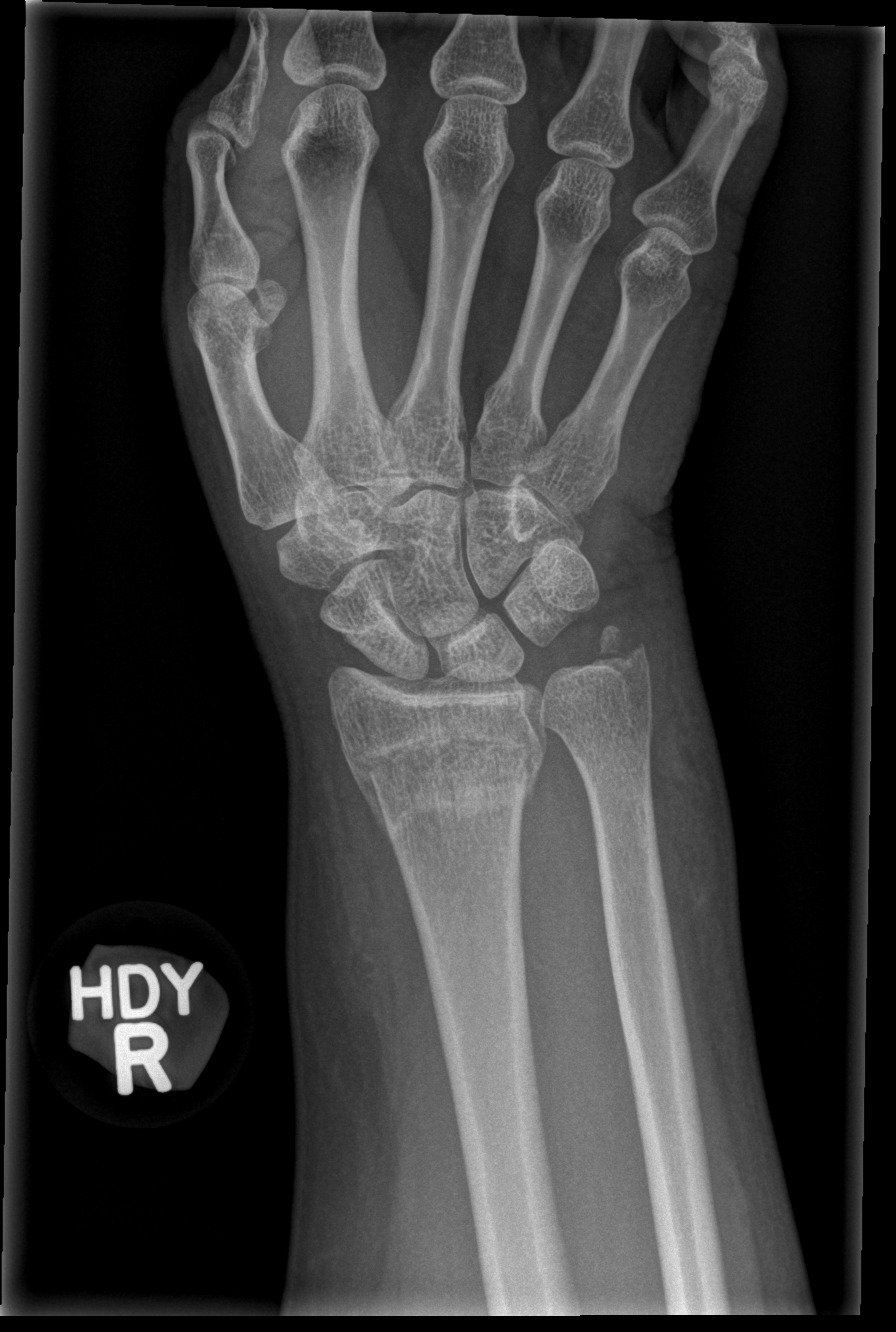
[im 2/4]
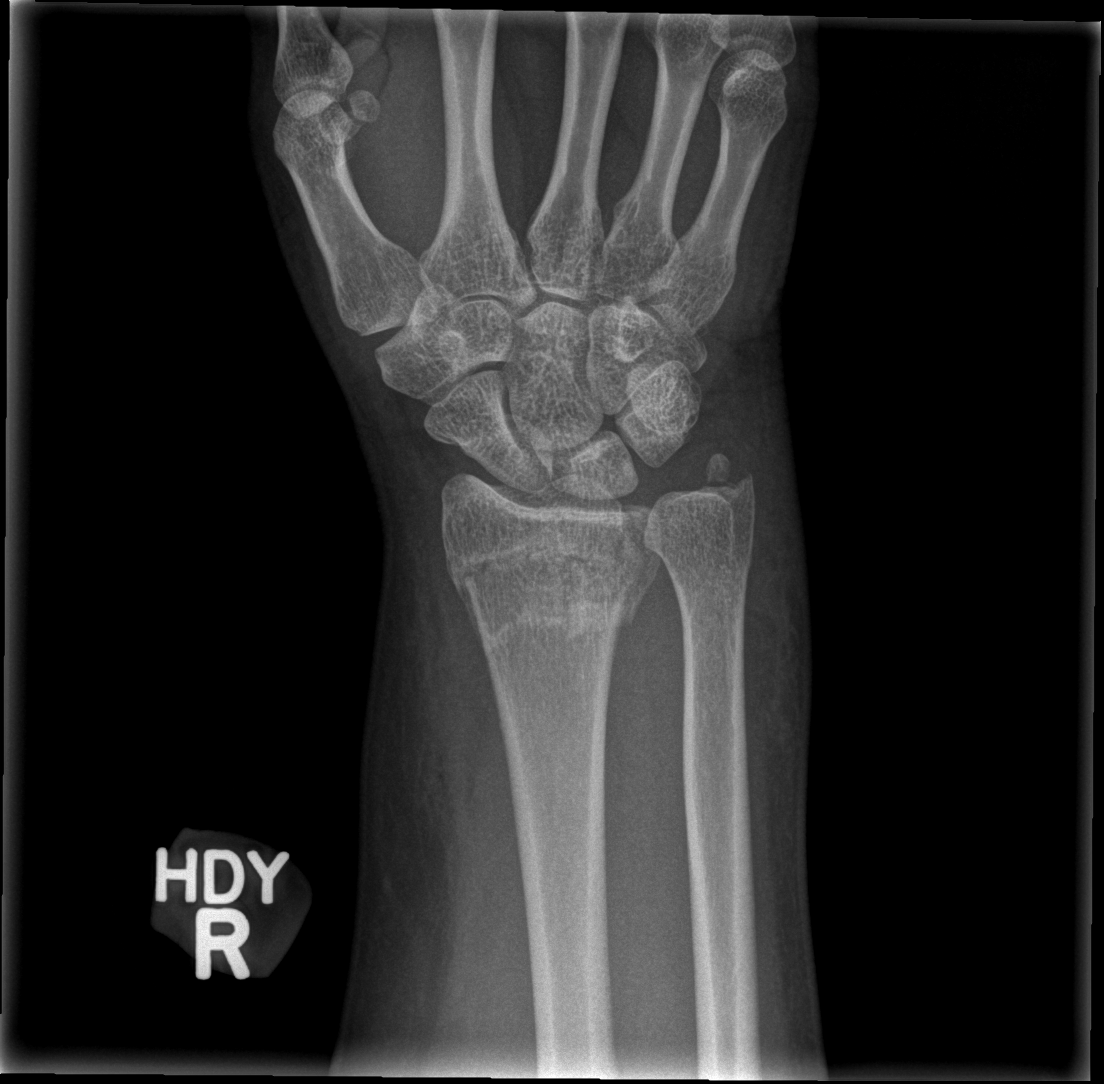
[im 3/4]
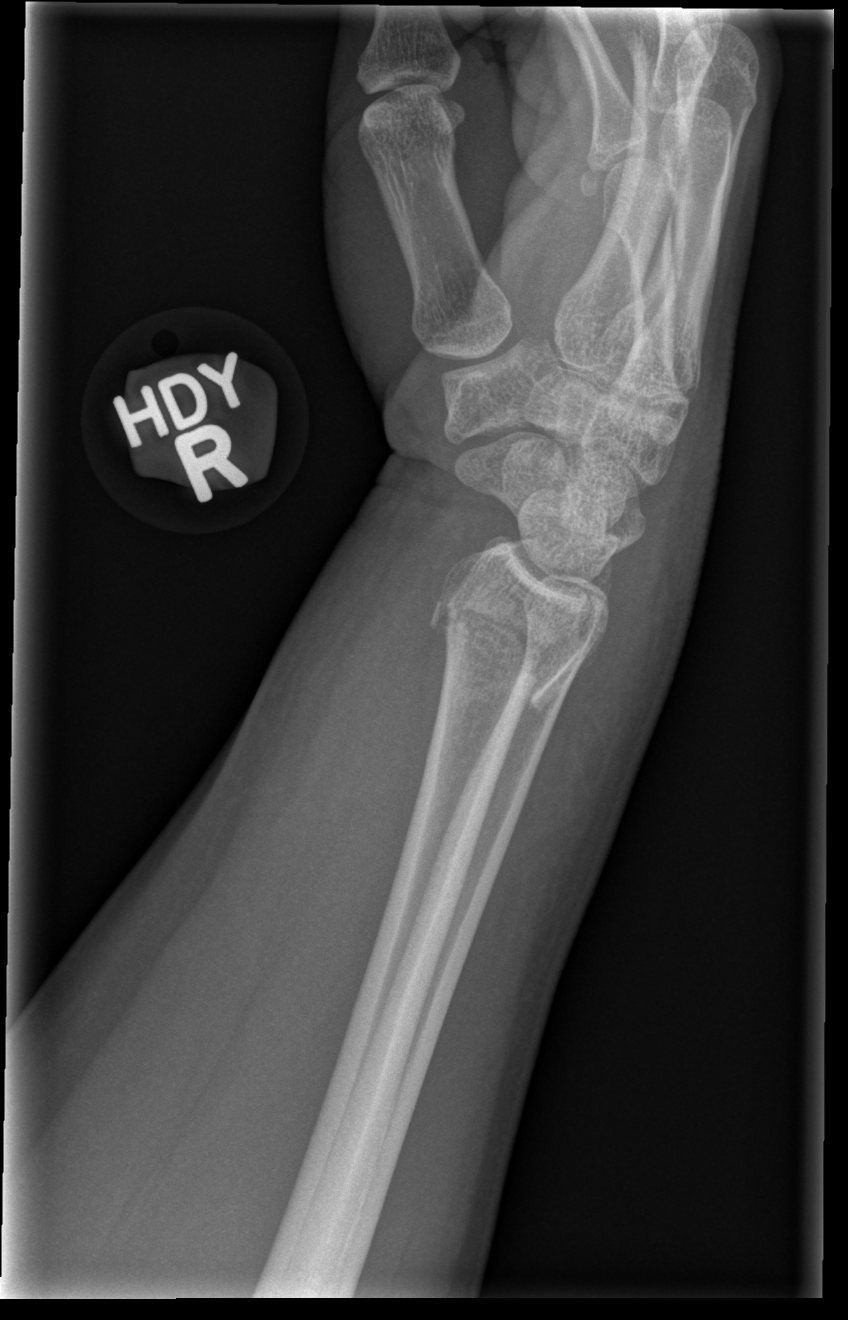
[im 4/4]
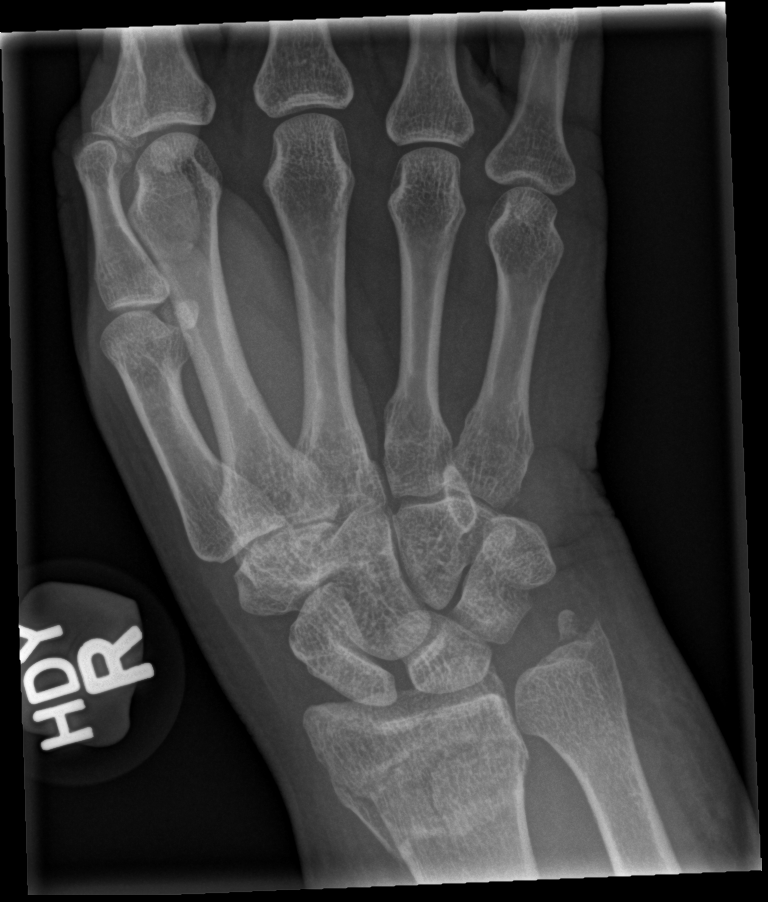

[4 of 4 positions shown; findings below may reference images not displayed]

FINDINGS: Comminuted fracture of the distal radial metaphysis present with
mild dorsal angulation and impaction present. There is associated
ulnar styloid avulsion with minimal displacement. There also is
suggested mild increase in scapholunate distance of 3-4 mm which may
be consistent with ligamentous injury. No carpal bone fracture is
identified. Surrounding soft tissues show soft tissue swelling.
IMPRESSION: Comminuted fracture of the distal radial metaphysis demonstrating
mild dorsal angulation and impaction. Associated ulnar styloid
avulsion. Associated increase in scapholunate distance to 3-4 mm may
be consistent with ligamentous injury.

## 2016-09-22 ENCOUNTER — Emergency Department
Admission: EM | Admit: 2016-09-22 | Discharge: 2016-09-22 | Disposition: A | Discharge status: Home / Self Care | Payer: PRIVATE HEALTH INSURANCE | Attending: Emergency Medicine | Admitting: Emergency Medicine

## 2016-09-22 ENCOUNTER — Emergency Department: Payer: PRIVATE HEALTH INSURANCE

## 2016-09-22 ENCOUNTER — Encounter: Payer: Self-pay | Admitting: Emergency Medicine

## 2016-09-22 DIAGNOSIS — Z79899 Other long term (current) drug therapy: Secondary | ICD-10-CM | POA: Diagnosis not present

## 2016-09-22 DIAGNOSIS — R1030 Lower abdominal pain, unspecified: Secondary | ICD-10-CM | POA: Diagnosis present

## 2016-09-22 DIAGNOSIS — R102 Pelvic and perineal pain: Secondary | ICD-10-CM | POA: Insufficient documentation

## 2016-09-22 DIAGNOSIS — J45909 Unspecified asthma, uncomplicated: Secondary | ICD-10-CM | POA: Diagnosis not present

## 2016-09-22 DIAGNOSIS — R52 Pain, unspecified: Secondary | ICD-10-CM

## 2016-09-22 LAB — BASIC METABOLIC PANEL
Anion gap: 5 (ref 5–15)
BUN: 12 mg/dL (ref 6–20)
CO2: 27 mmol/L (ref 22–32)
Calcium: 8.6 mg/dL — ABNORMAL LOW (ref 8.9–10.3)
Chloride: 107 mmol/L (ref 101–111)
Creatinine, Ser: 0.89 mg/dL (ref 0.44–1.00)
GFR calc Af Amer: >60 mL/min (ref >60)
GFR calc non Af Amer: >60 mL/min (ref >60)
Glucose, Bld: 93 mg/dL (ref 65–99)
Potassium: 3.8 mmol/L (ref 3.5–5.1)
Sodium: 139 mmol/L (ref 135–145)

## 2016-09-22 LAB — CBC WITH DIFFERENTIAL/PLATELET
Basophils Absolute: 0.1 10*3/uL (ref 0–0.1)
Basophils Relative: 1 %
Eosinophils Absolute: 0.3 10*3/uL (ref 0–0.7)
Eosinophils Relative: 3 %
HCT: 41.7 % (ref 35.0–47.0)
Hemoglobin: 13.7 g/dL (ref 12.0–16.0)
Lymphocytes Relative: 31 %
Lymphs Abs: 3 10*3/uL (ref 1.0–3.6)
MCH: 28.7 pg (ref 26.0–34.0)
MCHC: 33 g/dL (ref 32.0–36.0)
MCV: 87.1 fL (ref 80.0–100.0)
Monocytes Absolute: 0.8 10*3/uL (ref 0.2–0.9)
Monocytes Relative: 8 %
Neutro Abs: 5.6 10*3/uL (ref 1.4–6.5)
Neutrophils Relative %: 57 %
Platelets: 329 10*3/uL (ref 150–440)
RBC: 4.78 MIL/uL (ref 3.80–5.20)
RDW: 14.1 % (ref 11.5–14.5)
WBC: 9.8 10*3/uL (ref 3.6–11.0)

## 2016-09-22 LAB — URINALYSIS COMPLETE WITH MICROSCOPIC (ARMC ONLY)
Bacteria, UA: NONE SEEN
Bilirubin Urine: NEGATIVE
Glucose, UA: NEGATIVE mg/dL
Ketones, ur: NEGATIVE mg/dL
Leukocytes, UA: NEGATIVE
Nitrite: NEGATIVE
Protein, ur: NEGATIVE mg/dL
Specific Gravity, Urine: 1.003 — ABNORMAL LOW (ref 1.005–1.030)
WBC, UA: NONE SEEN WBC/hpf (ref 0–5)
pH: 6 (ref 5.0–8.0)

## 2016-09-22 LAB — WET PREP, GENITAL
Clue Cells Wet Prep HPF POC: NONE SEEN
Sperm: NONE SEEN
Trich, Wet Prep: NONE SEEN
WBC, Wet Prep HPF POC: NONE SEEN
Yeast Wet Prep HPF POC: NONE SEEN

## 2016-09-22 LAB — CHLAMYDIA/NGC RT PCR (ARMC ONLY)
Chlamydia Tr: NOT DETECTED
N gonorrhoeae: NOT DETECTED

## 2016-09-22 LAB — POCT PREGNANCY, URINE: Preg Test, Ur: NEGATIVE

## 2016-09-22 MED ORDER — KETOROLAC TROMETHAMINE 30 MG/ML IJ SOLN: 60.0000 mg | Freq: Once | INTRAMUSCULAR | Status: DC

## 2016-09-22 MED ORDER — IBUPROFEN 800 MG PO TABS: 800.0000 mg | ORAL_TABLET | Freq: Three times a day (TID) | ORAL | 0 refills | Status: DC | PRN

## 2016-09-22 MED ORDER — HYDROCODONE-ACETAMINOPHEN 5-325 MG PO TABS: 1.0000 | ORAL_TABLET | Freq: Four times a day (QID) | ORAL | 0 refills | Status: DC | PRN

## 2016-09-22 MED ORDER — KETOROLAC TROMETHAMINE 30 MG/ML IJ SOLN
10.0000 mg | Freq: Once | INTRAMUSCULAR | Status: AC
  Administered 2016-09-22: 9.9 mg via INTRAVENOUS
  Filled 2016-09-22: qty 1

## 2016-09-22 NOTE — ED Notes (Signed)
Discharge instructions reviewed with patient. Patient verbalized understanding. Patient ambulated to lobby without difficulty.   

## 2016-09-22 NOTE — ED Notes (Signed)
Patient transported to Ultrasound 

## 2016-09-22 NOTE — Discharge Instructions (Signed)
1. You may take pain medicines as needed (Motrin/Norco). °2. Return to the ER for worsening symptoms, persistent vomiting, difficulty breathing or other concerns. °

## 2016-09-22 NOTE — ED Provider Notes (Signed)
[facility] Emergency Department Provider Note   ____________________________________________   First MD Initiated Contact with Patient 09/22/16 [phone]     (approximate)  I have reviewed the triage vital signs and the nursing notes.   HISTORY  Chief Complaint Abdominal Pain    HPI Dalyce Renne Rutkowski is a 40 y.o. female who presents to the ED from home with a chief complaint of lower abdominal pain. Patient reports a 5 day history of lower abdominal pain, now left greater than right. Reports an abnormally heavier period last week with passing clots. Last sexual intercourse 2 days ago. Denies associated fever, chills, chest pain, shortness of breath, dysuria, nausea, vomiting, diarrhea. Denies recent travel or trauma. Nothing makes her symptoms better or worse.   Past Medical History:  Diagnosis Date  . Asthma   . Lupus     There are no active problems to display for this patient.   Past Surgical History:  Procedure Laterality Date  . TONSILLECTOMY    . TUBAL LIGATION      Prior to Admission medications   Medication Sig Start Date End Date Taking? Authorizing Provider  clindamycin (CLEOCIN) 300 MG capsule Take 1 capsule (300 mg total) by mouth 3 (three) times daily. 04/12/16   Rebecka Apley, MD  cyclobenzaprine (FLEXERIL) 10 MG tablet Take 1 tablet (10 mg total) by mouth 3 (three) times daily as needed for muscle spasms. 04/13/16   Jami L Hagler, PA-C  HYDROcodone-acetaminophen (NORCO) 5-325 MG tablet Take 1 tablet by mouth every 6 (six) hours as needed for moderate pain. 09/22/16   Irean Hong, MD  ibuprofen (ADVIL,MOTRIN) 800 MG tablet Take 1 tablet (800 mg total) by mouth every 8 (eight) hours as needed for moderate pain. 09/22/16   Irean Hong, MD  magic mouthwash SOLN Take 5 mLs by mouth 4 (four) times daily as needed for mouth pain. Please mix 60mL of each ingredient totaling 04/13/16   Jami L Hagler, PA-C  metoCLOPramide (REGLAN) 10 MG tablet  Take 1 tablet (10 mg total) by mouth every 6 (six) hours as needed for nausea or vomiting. 08/15/15   Myrna Blazer, MD  oxyCODONE-acetaminophen (ROXICET) 5-325 MG per tablet Take 1-2 tablets by mouth every 4 (four) hours as needed for severe pain. 05/05/15   Evangeline Dakin, PA-C  traMADol (ULTRAM) 50 MG tablet Take 1 tablet (50 mg total) by mouth every 6 (six) hours as needed. 04/12/16   Rebecka Apley, MD    Allergies Prednisone  Family History  Problem Relation Age of Onset  . Diabetes Mother   . Congestive Heart Failure Mother     Social History Social History  Substance Use Topics  . Smoking status: Never Smoker  . Smokeless tobacco: Not on file  . Alcohol use Yes    Review of Systems  Constitutional: No fever/chills. Eyes: No visual changes. ENT: No sore throat. Cardiovascular: Denies chest pain. Respiratory: Denies shortness of breath. Gastrointestinal: No abdominal pain.  No nausea, no vomiting.  No diarrhea.  No constipation. Genitourinary: Positive for pelvic pain. Negative for dysuria. Musculoskeletal: Negative for back pain. Skin: Negative for rash. Neurological: Negative for headaches, focal weakness or numbness.  10-point ROS otherwise negative.  ____________________________________________   PHYSICAL EXAM:  VITAL SIGNS: ED Triage Vitals [09/22/16 0028]  Enc Vitals Group     BP 105/60     Pulse Rate 60     Resp 18     Temp 97.7 F (  36.5 C)     Temp Source Oral     SpO2 100 %     Weight 174 lb (78.9 kg)     Height 4\' 11"  (1.499 m)     Head Circumference      Peak Flow      Pain Score 5     Pain Loc      Pain Edu?      Excl. in GC?     Constitutional: Alert and oriented. Well appearing and in no acute distress. Eyes: Conjunctivae are normal. PERRL. EOMI. Head: Atraumatic. Nose: No congestion/rhinnorhea. Mouth/Throat: Mucous membranes are moist.  Oropharynx non-erythematous. Neck: No stridor.   Cardiovascular: Normal rate,  regular rhythm. Grossly normal heart sounds.  Good peripheral circulation. Respiratory: Normal respiratory effort.  No retractions. Lungs CTAB. Gastrointestinal: Soft and mildly tender to palpation left pelvis without rebound or guarding. No distention. No abdominal bruits. No CVA tenderness. Musculoskeletal: No lower extremity tenderness nor edema.  No joint effusions. Neurologic:  Normal speech and language. No gross focal neurologic deficits are appreciated. No gait instability. Skin:  Skin is warm, dry and intact. No rash noted. Psychiatric: Mood and affect are normal. Speech and behavior are normal.  ____________________________________________   LABS (all labs ordered are listed, but only abnormal results are displayed)  Labs Reviewed  BASIC METABOLIC PANEL - Abnormal; Notable for the following:       Result Value   Calcium 8.6 (*)    All other components within normal limits  URINALYSIS COMPLETEWITH MICROSCOPIC ([facility] ONLY) - Abnormal; Notable for the following:    Color, Urine COLORLESS (*)    APPearance CLEAR (*)    Specific Gravity, Urine 1.003 (*)    Hgb urine dipstick 1+ (*)    Squamous Epithelial / LPF 0-5 (*)    All other components within normal limits  CHLAMYDIA/NGC RT PCR ([facility] ONLY)  WET PREP, GENITAL  CBC WITH DIFFERENTIAL/PLATELET  POC URINE PREG, ED  POCT PREGNANCY, URINE   ____________________________________________  EKG  None ____________________________________________  RADIOLOGY  Ultrasound pelvis interpreted per Dr. Andria MeuseStevens: Normal ultrasound appearance of the uterus and ovaries. No evidence  of abnormal ovarian mass or torsion.   ____________________________________________   PROCEDURES  Procedure(s) performed:   Pelvic exam: External exam WNL without rashes, lesions or vesicles. Speculum exam reveal slight discharge. Bimanual exam WNL.  Procedures  Critical Care performed:  No  ____________________________________________   INITIAL IMPRESSION / ASSESSMENT AND PLAN / ED COURSE  Pertinent labs & imaging results that were available during my care of the patient were reviewed by me and considered in my medical decision making (see chart for details).  40 year old female who presents with left pelvic discomfort and heavier period last week. Will obtain screening lab work, urinalysis and proceed with pelvic ultrasound.  Clinical Course as of Sep 22 532  Thu Sep 22, 2016  [id number] Improve after Toradol. Updated patient of results of lab work, urinalysis, swabs and ultrasound. Strict return precautions given. Patient verbalizes understanding and agrees with plan of care.  [JS]    Clinical Course User Index [JS] Irean HongJade J Sung, MD     ____________________________________________   FINAL CLINICAL IMPRESSION(S) / ED DIAGNOSES  Final diagnoses:  Pain  Pelvic pain in female      NEW MEDICATIONS STARTED DURING THIS VISIT:  Discharge Medication List as of 09/22/2016  3:53 AM    START taking these medications   Details  HYDROcodone-acetaminophen (NORCO) 5-325 MG tablet Take 1 tablet by  mouth every 6 (six) hours as needed for moderate pain., Starting Thu 09/22/2016, Print         Note:  This document was prepared using Dragon voice recognition software and may include unintentional dictation errors.    Irean HongJade J Sung, MD 09/22/16 [phone]

## 2016-09-22 NOTE — ED Triage Notes (Signed)
Pt in with co lower abd pain x 5 days denies any dysuria or hx of the same. States had an abnormal period last week states was heavier and was passing clots.

## 2016-12-02 ENCOUNTER — Emergency Department
Admission: EM | Admit: 2016-12-02 | Discharge: 2016-12-02 | Disposition: A | Discharge status: Home / Self Care | Payer: PRIVATE HEALTH INSURANCE | Attending: Emergency Medicine | Admitting: Emergency Medicine

## 2016-12-02 ENCOUNTER — Encounter: Payer: Self-pay | Admitting: Emergency Medicine

## 2016-12-02 DIAGNOSIS — J45909 Unspecified asthma, uncomplicated: Secondary | ICD-10-CM | POA: Insufficient documentation

## 2016-12-02 DIAGNOSIS — K047 Periapical abscess without sinus: Secondary | ICD-10-CM

## 2016-12-02 DIAGNOSIS — Z79899 Other long term (current) drug therapy: Secondary | ICD-10-CM | POA: Insufficient documentation

## 2016-12-02 MED ORDER — KETOROLAC TROMETHAMINE 30 MG/ML IJ SOLN
30.0000 mg | Freq: Once | INTRAMUSCULAR | Status: AC
  Administered 2016-12-02: 30 mg via INTRAMUSCULAR
  Filled 2016-12-02: qty 1

## 2016-12-02 MED ORDER — IBUPROFEN 800 MG PO TABS: 800.0000 mg | ORAL_TABLET | Freq: Three times a day (TID) | ORAL | 0 refills | Status: DC | PRN

## 2016-12-02 MED ORDER — AMOXICILLIN 500 MG PO TABS: 500.0000 mg | ORAL_TABLET | Freq: Three times a day (TID) | ORAL | 0 refills | Status: DC

## 2016-12-02 NOTE — ED Triage Notes (Signed)
Pt states she woke up with right side facial swelling today. Pt with broken tooth and may be abscessed. Denies any resp distress. Vss.

## 2016-12-02 NOTE — ED Provider Notes (Signed)
[facility] Emergency Department Provider Note  ____________________________________________  Time seen: Approximately 3:22 PM  I have reviewed the triage vital signs and the nursing notes.   HISTORY  Chief Complaint Oral Swelling    HPI Monica Gonzalez is a 41 y.o. female , NAD, presents to the emergency family several hour history of right cheek swelling. States that she has intermittent right-sided upper dental pain for several weeks. Had some pain yesterday and woke this morning with swelling. Denies any abnormal redness or warmth has not noted any skin sores. Denies any injury or trauma to the face. Has had no swelling about the lips/tongue/throat. No itching or numbness of hives. Denies chest pain, shortness breath, wheezing, abdominal pain, nausea or vomiting. Has had no visual changes or lightheadedness.   Past Medical History:  Diagnosis Date  . Asthma   . Lupus     There are no active problems to display for this patient.   Past Surgical History:  Procedure Laterality Date  . TONSILLECTOMY    . TUBAL LIGATION      Prior to Admission medications   Medication Sig Start Date End Date Taking? Authorizing Provider  amoxicillin (AMOXIL) 500 MG tablet Take 1 tablet (500 mg total) by mouth 3 (three) times daily with meals. 12/02/16   Laylee Schooley L Jonna Dittrich, PA-C  clindamycin (CLEOCIN) 300 MG capsule Take 1 capsule (300 mg total) by mouth 3 (three) times daily. 04/12/16   Rebecka ApleyAllison P Webster, MD  cyclobenzaprine (FLEXERIL) 10 MG tablet Take 1 tablet (10 mg total) by mouth 3 (three) times daily as needed for muscle spasms. 04/13/16   Aavya Shafer L Alitza Cowman, PA-C  HYDROcodone-acetaminophen (NORCO) 5-325 MG tablet Take 1 tablet by mouth every 6 (six) hours as needed for moderate pain. 09/22/16   Irean HongJade J Sung, MD  ibuprofen (ADVIL,MOTRIN) 800 MG tablet Take 1 tablet (800 mg total) by mouth every 8 (eight) hours as needed (pain). 12/02/16   Krishawn Vanderweele L Zohra Clavel, PA-C  magic mouthwash SOLN  Take 5 mLs by mouth 4 (four) times daily as needed for mouth pain. Please mix 60mL of each ingredient totaling 180mL 04/13/16   Bj Morlock L Judit Awad, PA-C  metoCLOPramide (REGLAN) 10 MG tablet Take 1 tablet (10 mg total) by mouth every 6 (six) hours as needed for nausea or vomiting. 08/15/15   Myrna Blazeravid Matthew Schaevitz, MD  oxyCODONE-acetaminophen (ROXICET) 5-325 MG per tablet Take 1-2 tablets by mouth every 4 (four) hours as needed for severe pain. 05/05/15   Evangeline Dakinharles M Beers, PA-C  traMADol (ULTRAM) 50 MG tablet Take 1 tablet (50 mg total) by mouth every 6 (six) hours as needed. 04/12/16   Rebecka ApleyAllison P Webster, MD    Allergies Prednisone  Family History  Problem Relation Age of Onset  . Diabetes Mother   . Congestive Heart Failure Mother     Social History Social History  Substance Use Topics  . Smoking status: Never Smoker  . Smokeless tobacco: Never Used  . Alcohol use Yes     Review of Systems  Constitutional: No fever/chills Eyes: No visual changes.  ENT: Positive dental pain. No Swelling about the lips/tongue/throat. Cardiovascular: No chest pain. Respiratory:  No shortness of breath. No wheezing.  Gastrointestinal: No abdominal pain.  No nausea, vomiting.   Musculoskeletal: Negative for Neck pain, general myalgias.  Skin: Positive swelling right cheek. Negative for rash, itching, hives, skin sores. Neurological: Negative for headaches, focal weakness or numbness.No tingling. No lightheadedness, dizziness. 10-point ROS otherwise negative.  ____________________________________________  PHYSICAL EXAM:  VITAL SIGNS: ED Triage Vitals  Enc Vitals Group     BP 12/02/16 1337 111/62     Pulse Rate 12/02/16 1337 80     Resp 12/02/16 1337 20     Temp 12/02/16 1337 98.1 F (36.7 C)     Temp Source 12/02/16 1337 Oral     SpO2 12/02/16 1337 98 %     Weight 12/02/16 1338 169 lb (76.7 kg)     Height 12/02/16 1338 4\' 11"  (1.499 m)     Head Circumference --      Peak Flow --      Pain  Score --      Pain Loc --      Pain Edu? --      Excl. in GC? --      Constitutional: Alert and oriented. Well appearing and in no acute distress. Eyes: Conjunctivae are normal.  Head: Mild swelling is noted about the right cheek that correlates with the right upper gum line. ENT:      Nose: No congestion/rhinnorhea.      Mouth/Throat: Mucous membranes are moist. Pharynx without erythema, swelling, exudate. Uvula is midline. Airway is patent. No swelling about the lips/tongue. Mild swelling about the right, posterior upper gum line without erythema or oozing. Tenderness to palpation of the gumline in this area. Poor dentition throughout with significant decay and cavities. Neck: No stridor. Supple with full range of motion. Hematological/Lymphatic/Immunilogical: No cervical lymphadenopathy. Cardiovascular: Good peripheral circulation. Respiratory: Normal respiratory effort without tachypnea or retractions.  Neurologic:  Normal speech and language. No gross focal neurologic deficits are appreciated.  Skin:  Skin is warm, dry and intact. No rash noted. Psychiatric: Mood and affect are normal. Speech and behavior are normal. Patient exhibits appropriate insight and judgement.   ____________________________________________   LABS  None ____________________________________________  EKG  None ____________________________________________  RADIOLOGY  None ____________________________________________    PROCEDURES  Procedure(s) performed: None   Procedures   Medications  ketorolac (TORADOL) 30 MG/ML injection 30 mg (30 mg Intramuscular Given 12/02/16 1541)     ____________________________________________   INITIAL IMPRESSION / ASSESSMENT AND PLAN / ED COURSE  Pertinent labs & imaging results that were available during my care of the patient were reviewed by me and considered in my medical decision making (see chart for details).    Patient's diagnosis is  consistent with Dental abscess. Patient was given an injection of Toradol while in the emergency department and tolerated well without immediate side effects. Patient will be discharged home with prescriptions for amoxicillin and ibuprofen to take as directed. Patient is to follow up with a local dentist in 1 week if symptoms persist past this treatment course. Patient is given ED precautions to return to the ED for any worsening or new symptoms.   ____________________________________________  FINAL CLINICAL IMPRESSION(S) / ED DIAGNOSES  Final diagnoses:  Dental abscess      NEW MEDICATIONS STARTED DURING THIS VISIT:  New Prescriptions   AMOXICILLIN (AMOXIL) 500 MG TABLET    Take 1 tablet (500 mg total) by mouth 3 (three) times daily with meals.   IBUPROFEN (ADVIL,MOTRIN) 800 MG TABLET    Take 1 tablet (800 mg total) by mouth every 8 (eight) hours as needed (pain).         Hope Pigeon, PA-C 12/02/16 1607    Governor Rooks, MD 12/02/16 [phone]

## 2017-01-03 ENCOUNTER — Emergency Department: Payer: Self-pay

## 2017-01-03 ENCOUNTER — Encounter: Payer: Self-pay | Admitting: *Deleted

## 2017-01-03 ENCOUNTER — Emergency Department
Admission: EM | Admit: 2017-01-03 | Discharge: 2017-01-03 | Disposition: A | Discharge status: Home / Self Care | Payer: Self-pay | Attending: Emergency Medicine | Admitting: Emergency Medicine

## 2017-01-03 DIAGNOSIS — S60221A Contusion of right hand, initial encounter: Secondary | ICD-10-CM | POA: Insufficient documentation

## 2017-01-03 DIAGNOSIS — S0083XA Contusion of other part of head, initial encounter: Secondary | ICD-10-CM | POA: Insufficient documentation

## 2017-01-03 DIAGNOSIS — Y999 Unspecified external cause status: Secondary | ICD-10-CM | POA: Insufficient documentation

## 2017-01-03 DIAGNOSIS — J45909 Unspecified asthma, uncomplicated: Secondary | ICD-10-CM | POA: Insufficient documentation

## 2017-01-03 DIAGNOSIS — S60222A Contusion of left hand, initial encounter: Secondary | ICD-10-CM | POA: Insufficient documentation

## 2017-01-03 DIAGNOSIS — Y939 Activity, unspecified: Secondary | ICD-10-CM | POA: Insufficient documentation

## 2017-01-03 DIAGNOSIS — Y929 Unspecified place or not applicable: Secondary | ICD-10-CM | POA: Insufficient documentation

## 2017-01-03 MED ORDER — IBUPROFEN 800 MG PO TABS: 800.0000 mg | ORAL_TABLET | Freq: Three times a day (TID) | ORAL | 0 refills | Status: DC | PRN
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
## 2017-01-03 NOTE — ED Notes (Signed)
Ambulated to room 54
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
## 2017-04-28 ENCOUNTER — Encounter: Payer: Self-pay | Admitting: Emergency Medicine

## 2017-04-28 ENCOUNTER — Emergency Department
Admission: EM | Admit: 2017-04-28 | Discharge: 2017-04-28 | Disposition: A | Discharge status: Home / Self Care | Payer: PRIVATE HEALTH INSURANCE | Attending: Emergency Medicine | Admitting: Emergency Medicine

## 2017-04-28 DIAGNOSIS — J45909 Unspecified asthma, uncomplicated: Secondary | ICD-10-CM | POA: Insufficient documentation

## 2017-04-28 DIAGNOSIS — K029 Dental caries, unspecified: Secondary | ICD-10-CM

## 2017-04-28 DIAGNOSIS — Z79899 Other long term (current) drug therapy: Secondary | ICD-10-CM | POA: Insufficient documentation

## 2017-04-28 DIAGNOSIS — R6884 Jaw pain: Secondary | ICD-10-CM

## 2017-04-28 MED ORDER — IBUPROFEN 800 MG PO TABS: 800.0000 mg | ORAL_TABLET | Freq: Three times a day (TID) | ORAL | 0 refills | Status: DC | PRN

## 2017-04-28 MED ORDER — LIDOCAINE VISCOUS 2 % MT SOLN: 10.0000 mL | OROMUCOSAL | 0 refills | Status: DC | PRN

## 2017-04-28 MED ORDER — AMOXICILLIN 500 MG PO CAPS: 500.0000 mg | ORAL_CAPSULE | Freq: Three times a day (TID) | ORAL | 0 refills | Status: DC

## 2017-04-28 NOTE — ED Provider Notes (Signed)
[facility] Emergency Department Provider Note  ____________________________________________  Time seen: Approximately 3:24 PM  I have reviewed the triage vital signs and the nursing notes.   HISTORY  Chief Complaint Jaw Pain    HPI Monica Gonzalez is a 41 y.o. female that presents to emergency department with low jaw pain that radiates to her right ear and lower right tooth pain for 2 days. She is aware that she has several cavities. She is not having any difficulty opening or closing her mouth. Patient states that she has not seen a dentist in many years. No trauma to jaw. No swelling.She denies fever, shortness of breath, chest pain, nausea, vomiting, abdominal pain.   Past Medical History:  Diagnosis Date  . Asthma   . Lupus     There are no active problems to display for this patient.   Past Surgical History:  Procedure Laterality Date  . TONSILLECTOMY    . TUBAL LIGATION      Prior to Admission medications   Medication Sig Start Date End Date Taking? Authorizing Provider  amoxicillin (AMOXIL) 500 MG capsule Take 1 capsule (500 mg total) by mouth 3 (three) times daily. 04/28/17   Enid DerryWagner, Chaquita Basques, PA-C  clindamycin (CLEOCIN) 300 MG capsule Take 1 capsule (300 mg total) by mouth 3 (three) times daily. 04/12/16   Rebecka ApleyWebster, Allison P, MD  cyclobenzaprine (FLEXERIL) 10 MG tablet Take 1 tablet (10 mg total) by mouth 3 (three) times daily as needed for muscle spasms. 04/13/16   Hagler, Jami L, PA-C  HYDROcodone-acetaminophen (NORCO) 5-325 MG tablet Take 1 tablet by mouth every 6 (six) hours as needed for moderate pain. 09/22/16   Irean HongSung, Jade J, MD  ibuprofen (ADVIL,MOTRIN) 800 MG tablet Take 1 tablet (800 mg total) by mouth every 8 (eight) hours as needed. 04/28/17   Enid DerryWagner, Aaradhya Kysar, PA-C  lidocaine (XYLOCAINE) 2 % solution Use as directed 10 mLs in the mouth or throat as needed for mouth pain. 04/28/17   Enid DerryWagner, Kwanza Cancelliere, PA-C  magic mouthwash SOLN Take 5 mLs by  mouth 4 (four) times daily as needed for mouth pain. Please mix 60mL of each ingredient totaling 180mL 04/13/16   Hagler, Jami L, PA-C  metoCLOPramide (REGLAN) 10 MG tablet Take 1 tablet (10 mg total) by mouth every 6 (six) hours as needed for nausea or vomiting. 08/15/15   Schaevitz, Myra Rudeavid Matthew, MD  oxyCODONE-acetaminophen (ROXICET) 5-325 MG per tablet Take 1-2 tablets by mouth every 4 (four) hours as needed for severe pain. 05/05/15   Beers, Charmayne Sheerharles M, PA-C  traMADol (ULTRAM) 50 MG tablet Take 1 tablet (50 mg total) by mouth every 6 (six) hours as needed. 04/12/16   Rebecka ApleyWebster, Allison P, MD    Allergies Prednisone  Family History  Problem Relation Age of Onset  . Diabetes Mother   . Congestive Heart Failure Mother     Social History Social History  Substance Use Topics  . Smoking status: Never Smoker  . Smokeless tobacco: Never Used  . Alcohol use Yes     Review of Systems  Constitutional: No fever/chills Cardiovascular: No chest pain. Respiratory: No SOB. Gastrointestinal: No abdominal pain.  No nausea, no vomiting.  Musculoskeletal: Positive for jaw pain. Skin: Negative for rash, abrasions, lacerations, ecchymosis. Neurological: Negative for headaches, numbness or tingling   ____________________________________________   PHYSICAL EXAM:  VITAL SIGNS: ED Triage Vitals  Enc Vitals Group     BP 04/28/17 1416 103/66     Pulse Rate 04/28/17 1416 60  Resp 04/28/17 1416 18     Temp 04/28/17 1416 98.2 F (36.8 C)     Temp Source 04/28/17 1416 Oral     SpO2 04/28/17 1416 98 %     Weight 04/28/17 1417 170 lb (77.1 kg)     Height 04/28/17 1417 4\' 11"  (1.499 m)     Head Circumference --      Peak Flow --      Pain Score 04/28/17 1416 7     Pain Loc --      Pain Edu? --      Excl. in GC? --      Constitutional: Alert and oriented. Well appearing and in no acute distress. Eyes: Conjunctivae are normal. PERRL. EOMI. Head: Atraumatic. ENT:      Ears:      Nose: No  congestion/rhinnorhea.      Mouth/Throat: Mucous membranes are moist. Tenderness to palpation over lower right molars with visible dental decay. Poor dentition overall. No swelling. No drainage from mouth. No TMJ pain.  Neck: No stridor.  Cardiovascular: Normal rate, regular rhythm.  Good peripheral circulation. Respiratory: Normal respiratory effort without tachypnea or retractions. Lungs CTAB. Good air entry to the bases with no decreased or absent breath sounds. Musculoskeletal: Full range of motion to all extremities. No gross deformities appreciated. Neurologic:  Normal speech and language. No gross focal neurologic deficits are appreciated.  Skin:  Skin is warm, dry and intact. No rash noted.   ____________________________________________   LABS (all labs ordered are listed, but only abnormal results are displayed)  Labs Reviewed - No data to display ____________________________________________  EKG   ____________________________________________  RADIOLOGY  No results found.  ____________________________________________    PROCEDURES  Procedure(s) performed:    Procedures    Medications - No data to display   ____________________________________________   INITIAL IMPRESSION / ASSESSMENT AND PLAN / ED COURSE  Pertinent labs & imaging results that were available during my care of the patient were reviewed by me and considered in my medical decision making (see chart for details).  Review of the [place] CSRS was performed in accordance of the [facility] prior to dispensing any controlled drugs.   Patient's diagnosis is consistent with dental cavities. Vital signs and exam are reassuring. Patient will be discharged home with prescriptions for amoxicillin and viscous lidocaine. Patient is to follow up with dentist as directed. Patient is given ED precautions to return to the ED for any worsening or new symptoms.   ____________________________________________  FINAL  CLINICAL IMPRESSION(S) / ED DIAGNOSES  Final diagnoses:  Dental cavities  Jaw pain      NEW MEDICATIONS STARTED DURING THIS VISIT:  Discharge Medication List as of 04/28/2017  3:52 PM    START taking these medications   Details  amoxicillin (AMOXIL) 500 MG capsule Take 1 capsule (500 mg total) by mouth 3 (three) times daily., Starting Fri 04/28/2017, Print    lidocaine (XYLOCAINE) 2 % solution Use as directed 10 mLs in the mouth or throat as needed for mouth pain., Starting Fri 04/28/2017, Print            This chart was dictated using voice recognition software/Dragon. Despite best efforts to proofread, errors can occur which can change the meaning. Any change was purely unintentional.    Enid Derry, PA-C 04/28/17 1625    Don Perking, [place], MD 04/28/17 [phone]

## 2017-04-28 NOTE — Discharge Instructions (Signed)
OPTIONS FOR DENTAL FOLLOW UP CARE ° °[place] Department of Health and Human Services - Local Safety Net Dental Clinics °http://www.ncdhhs.gov/dph/oralhealth/services/safetynetclinics.htm °  °[facility] (336-562-3123) ° °[facility] [place] ([phone]) ° °[facility] ([phone] ext [phone]) ° °[facility] ([phone]) ° °[facility] ([phone]) °This clinic caters to the indigent population and is on a lottery system. °Location: °[facility], [address], [place] °Clinic Hours: °Wednesdays from 6pm - 9pm, patients seen by a lottery system. °For dates, call or go to [email or web address].edu/[facility]/patients/Dental-[facility] °Services: °Cleanings, fillings and simple extractions. °Payment Options: °DENTAL WORK IS FREE OF CHARGE. Bring proof of income or support. °Best way to get seen: °Arrive at 5:15 pm - this is a lottery, NOT first come/first serve, so arriving earlier will not increase your chances of being seen. °  °  °[facility] °[phone] °Select option 1 for emergencies °  °Location: °[facility], [address], [place] °Clinic Hours: °No walk-ins accepted - call the day before to schedule an appointment. °Check in times are 9:30 am and 1:30 pm. °Services: °Simple extractions, temporary fillings, pulpectomy/pulp debridement, uncomplicated abscess drainage. °Payment Options: °PAYMENT IS DUE AT THE TIME OF SERVICE.  Fee is usually $100-200, additional surgical procedures (e.g. abscess drainage) may be extra. °Cash, checks, Visa/MasterCard accepted.  Can file Medicaid if patient is covered for dental - patient should call case worker to check. °No discount for [facility] patients. °Best way to get seen: °MUST call the day before and get onto the schedule. Can usually be seen the next 1-2 days. No walk-ins accepted. °  °  °[facility] °[phone] °   °Location: °[facility], [address], [place] °Clinic Hours: °M, W, Th, F 8am or 1:30pm, Tues 9a or 1:30 - first come/first served. °Services: °Simple extractions, temporary fillings, uncomplicated abscess drainage.  You do not need to be an [place] resident. °Payment Options: °PAYMENT IS DUE AT THE TIME OF SERVICE. °Dental insurance, otherwise sliding scale - bring proof of income or support. °Depending on income and treatment needed, cost is usually $50-200. °Best way to get seen: °Arrive early as it is first come/first served. °  °  °[facility] °[phone] °  °Location: °[address] °Clinic Hours: °Mon-Thu 8a-5p °Services: °Most basic dental services including extractions and fillings. °Payment Options: °PAYMENT IS DUE AT THE TIME OF SERVICE. °Sliding scale, up to 50% off - bring proof if income or support. °Medicaid with dental option accepted. °Best way to get seen: °Call to schedule an appointment, can usually be seen within 2 weeks OR they will try to see walk-ins - show up at 8a or 2p (you may have to wait). °  °  °[facility] °[phone] °[place] RESIDENTS ONLY °  °Location: °[facility], [address], [place],  [address] °Clinic Hours: By appointment only. °Monday - Thursday 8am-5pm, Friday 8am-12pm °Services: Cleanings, fillings, extractions. °Payment Options: °PAYMENT IS DUE AT THE TIME OF SERVICE. °Cash, Visa or MasterCard. Sliding scale - $30 minimum per service. °Best way to get seen: °Come in to office, complete packet and make an appointment - need proof of income °or support monies for each household member and proof of [place] residence. °Usually takes about a month to get in. °  °  °[facility] °[phone] °  °Location: °[address]   [place] °Clinic Hours: Walk-in Urgent Care Dental Services are offered Monday-Friday  mornings only. °The numbers of emergencies accepted daily is limited to the number of °providers available. °Maximum 15 - Mondays, Wednesdays & Thursdays °Maximum 10 - Tuesdays & Fridays °Services: °You do not need to be a [place] resident to be seen for a dental emergency. °Emergencies are defined as pain, swelling, abnormal bleeding, or dental trauma. Walkins will receive x-rays if needed. °NOTE: Dental cleaning is not an emergency. °Payment Options: °PAYMENT IS DUE AT THE TIME OF SERVICE. °Minimum co-pay is $40.00 for uninsured patients. °Minimum co-pay is $3.00 for Medicaid with dental coverage. °Dental Insurance is accepted and must be presented at time of visit. °Medicare does not cover dental. °Forms of payment: Cash, credit card, checks. °Best way to get seen: °If not previously registered with the clinic, walk-in dental registration begins at 7:15 am and is on a first come/first serve basis. °If previously registered with the clinic, call to make an appointment. °  °  °[facility] °[phone] °[place] RESIDENTS ONLY °  °Location: °[address], [place], [place] °Clinic Hours: °Mon-Thu 10a-2p °Services: Extractions only! °Payment Options: °FREE (donations accepted) - bring proof of income or support °Best way to get seen: °Call and schedule an appointment OR come at 8am on the 1st Monday of every month (except for holidays) when it is first come/first served. °  °  °[facility] °[phone] °  °Location: °[address], [place] °Clinic Hours: °Friday mornings °Services, Payment Options, Best way to get seen: °Call for info °

## 2017-04-28 NOTE — ED Triage Notes (Signed)
Pt reports right side jaw pain for two weeks.

## 2018-07-17 ENCOUNTER — Encounter: Payer: Self-pay | Admitting: Emergency Medicine

## 2018-07-17 ENCOUNTER — Other Ambulatory Visit: Payer: Self-pay

## 2018-07-17 DIAGNOSIS — R1012 Left upper quadrant pain: Secondary | ICD-10-CM | POA: Diagnosis present

## 2018-07-17 DIAGNOSIS — K219 Gastro-esophageal reflux disease without esophagitis: Secondary | ICD-10-CM | POA: Insufficient documentation

## 2018-07-17 DIAGNOSIS — J45909 Unspecified asthma, uncomplicated: Secondary | ICD-10-CM | POA: Insufficient documentation

## 2018-07-17 LAB — URINALYSIS, COMPLETE (UACMP) WITH MICROSCOPIC
Bacteria, UA: NONE SEEN
Bilirubin Urine: NEGATIVE
Glucose, UA: NEGATIVE mg/dL
Hgb urine dipstick: NEGATIVE
Ketones, ur: NEGATIVE mg/dL
Leukocytes, UA: NEGATIVE
Nitrite: NEGATIVE
Protein, ur: NEGATIVE mg/dL
Specific Gravity, Urine: 1.005 (ref 1.005–1.030)
WBC, UA: NONE SEEN WBC/hpf (ref 0–5)
pH: 6 (ref 5.0–8.0)

## 2018-07-17 LAB — COMPREHENSIVE METABOLIC PANEL
ALT: 12 U/L (ref 0–44)
AST: 21 U/L (ref 15–41)
Albumin: 3.8 g/dL (ref 3.5–5.0)
Alkaline Phosphatase: 45 U/L (ref 38–126)
Anion gap: 6 (ref 5–15)
BUN: 7 mg/dL (ref 6–20)
CO2: 26 mmol/L (ref 22–32)
Calcium: 8.6 mg/dL — ABNORMAL LOW (ref 8.9–10.3)
Chloride: 107 mmol/L (ref 98–111)
Creatinine, Ser: 0.77 mg/dL (ref 0.44–1.00)
GFR calc Af Amer: >60 mL/min (ref >60)
GFR calc non Af Amer: >60 mL/min (ref >60)
Glucose, Bld: 86 mg/dL (ref 70–99)
Potassium: 3.4 mmol/L — ABNORMAL LOW (ref 3.5–5.1)
Sodium: 139 mmol/L (ref 135–145)
Total Bilirubin: 0.8 mg/dL (ref 0.3–1.2)
Total Protein: 6.8 g/dL (ref 6.5–8.1)

## 2018-07-17 LAB — CBC
HCT: 38.1 % (ref 35.0–47.0)
Hemoglobin: 12.3 g/dL (ref 12.0–16.0)
MCH: 26.4 pg (ref 26.0–34.0)
MCHC: 32.4 g/dL (ref 32.0–36.0)
MCV: 81.6 fL (ref 80.0–100.0)
Platelets: 357 10*3/uL (ref 150–440)
RBC: 4.67 MIL/uL (ref 3.80–5.20)
RDW: 15.6 % — ABNORMAL HIGH (ref 11.5–14.5)
WBC: 7.6 10*3/uL (ref 3.6–11.0)

## 2018-07-17 LAB — POCT PREGNANCY, URINE: Preg Test, Ur: NEGATIVE

## 2018-07-17 LAB — TROPONIN I: Troponin I: <0.03 ng/mL (ref <0.03)

## 2018-07-17 LAB — LIPASE, BLOOD: Lipase: 30 U/L (ref 11–51)

## 2018-07-17 NOTE — ED Triage Notes (Signed)
Patient ambulatory to triage with steady gait, without difficulty or distress noted; pt reports left upper abd pain accomp by nausea esp after eating

## 2018-07-18 ENCOUNTER — Emergency Department
Admission: EM | Admit: 2018-07-18 | Discharge: 2018-07-18 | Disposition: A | Discharge status: Home / Self Care | Payer: PRIVATE HEALTH INSURANCE | Attending: Emergency Medicine | Admitting: Emergency Medicine

## 2018-07-18 ENCOUNTER — Emergency Department: Payer: PRIVATE HEALTH INSURANCE

## 2018-07-18 DIAGNOSIS — R1010 Upper abdominal pain, unspecified: Secondary | ICD-10-CM

## 2018-07-18 DIAGNOSIS — K219 Gastro-esophageal reflux disease without esophagitis: Secondary | ICD-10-CM

## 2018-07-18 DIAGNOSIS — R110 Nausea: Secondary | ICD-10-CM

## 2018-07-18 MED ORDER — OMEPRAZOLE MAGNESIUM 20 MG PO TBEC: 20.0000 mg | DELAYED_RELEASE_TABLET | Freq: Every day | ORAL | 1 refills | Status: DC

## 2018-07-18 MED ORDER — ONDANSETRON 4 MG PO TBDP: ORAL_TABLET | ORAL | 0 refills | Status: DC

## 2018-07-18 MED ORDER — SUCRALFATE 1 G PO TABS: 1.0000 g | ORAL_TABLET | Freq: Four times a day (QID) | ORAL | 1 refills | Status: DC | PRN

## 2018-07-18 NOTE — ED Notes (Signed)

## 2018-07-18 NOTE — ED Notes (Signed)
Pt. Returned to tx. room in stable condition with no acute changes since departure from unit for scans. Patient is resting comfortably at this time with no signs of distress present. Equal, unlabored rise and fall of chest noted within normal rate. VS stable. Will continue to monitor.

## 2018-07-18 NOTE — Discharge Instructions (Signed)
We believe your symptoms are a result of GERD (acid reflux).  Please read through the included information and follow up with your regular doctor.  In the meantime, we encourage you to try an over-the-counter medication such as Prilosec OTC.  Give it at least a week at see if your symptoms improve. ° °Return to the Emergency Department with new or worsening symptoms that concern you. °

## 2018-07-18 NOTE — ED Provider Notes (Addendum)
[facility] Emergency Department Provider Note  ____________________________________________   First MD Initiated Contact with Patient 07/18/18 0017     (approximate)  I have reviewed the triage vital signs and the nursing notes.   HISTORY  Chief Complaint Abdominal Pain    HPI Monica Gonzalez is a 42 y.o. female with medical history as listed below who presents for evaluation of at least 4 days of upper abdominal pain that has been constant, waxes and wanes from mild to severe, and gets worse after eating.  She has had nausea but no vomiting.  It is also worse when she lies down flat.  It feels aching and dull but occasionally sharp.  Some burning.  She denies fever/chills, chest pain or shortness of breath, lower abdominal pain, diarrhea, and dysuria.  She has no known history of gallbladder disease.  History of tubal ligation but no other abdominal surgeries.  Past Medical History:  Diagnosis Date  . Asthma   . Lupus (HCC)     There are no active problems to display for this patient.   Past Surgical History:  Procedure Laterality Date  . TONSILLECTOMY    . TUBAL LIGATION      Prior to Admission medications   Medication Sig Start Date End Date Taking? Authorizing Provider  amoxicillin (AMOXIL) 500 MG capsule Take 1 capsule (500 mg total) by mouth 3 (three) times daily. 04/28/17   Enid Derry, PA-C  clindamycin (CLEOCIN) 300 MG capsule Take 1 capsule (300 mg total) by mouth 3 (three) times daily. 04/12/16   Rebecka Apley, MD  cyclobenzaprine (FLEXERIL) 10 MG tablet Take 1 tablet (10 mg total) by mouth 3 (three) times daily as needed for muscle spasms. 04/13/16   Hagler, Jami L, PA-C  HYDROcodone-acetaminophen (NORCO) 5-325 MG tablet Take 1 tablet by mouth every 6 (six) hours as needed for moderate pain. 09/22/16   Irean Hong, MD  ibuprofen (ADVIL,MOTRIN) 800 MG tablet Take 1 tablet (800 mg total) by mouth every 8 (eight) hours as needed. 04/28/17    Enid Derry, PA-C  lidocaine (XYLOCAINE) 2 % solution Use as directed 10 mLs in the mouth or throat as needed for mouth pain. 04/28/17   Enid Derry, PA-C  magic mouthwash SOLN Take 5 mLs by mouth 4 (four) times daily as needed for mouth pain. Please mix 60mL of each ingredient totaling 04/13/16   Hagler, Jami L, PA-C  metoCLOPramide (REGLAN) 10 MG tablet Take 1 tablet (10 mg total) by mouth every 6 (six) hours as needed for nausea or vomiting. 08/15/15   Schaevitz, Myra Rude, MD  omeprazole (PRILOSEC OTC) 20 MG tablet Take 1 tablet (20 mg total) by mouth daily. 07/18/18 07/18/19  Loleta Rose, MD  ondansetron (ZOFRAN ODT) 4 MG disintegrating tablet Allow 1-2 tablets to dissolve in your mouth every 8 hours as needed for nausea/vomiting 07/18/18   Loleta Rose, MD  oxyCODONE-acetaminophen (ROXICET) 5-325 MG per tablet Take 1-2 tablets by mouth every 4 (four) hours as needed for severe pain. 05/05/15   Beers, Charmayne Sheer, PA-C  sucralfate (CARAFATE) 1 g tablet Take 1 tablet (1 g total) by mouth 4 (four) times daily as needed (for abdominal discomfort, nausea, and/or vomiting). 07/18/18   Loleta Rose, MD  traMADol (ULTRAM) 50 MG tablet Take 1 tablet (50 mg total) by mouth every 6 (six) hours as needed. 04/12/16   Rebecka Apley, MD    Allergies Prednisone  Family History  Problem Relation Age of  Onset  . Diabetes Mother   . Congestive Heart Failure Mother     Social History Social History   Tobacco Use  . Smoking status: Never Smoker  . Smokeless tobacco: Never Used  Substance Use Topics  . Alcohol use: Yes  . Drug use: No    Review of Systems Constitutional: No fever/chills Eyes: No visual changes. ENT: No sore throat. Cardiovascular: Denies chest pain. Respiratory: Denies shortness of breath. Gastrointestinal: Upper abdominal pain and nausea as described above with no vomiting and no diarrhea Genitourinary: Negative for dysuria. Musculoskeletal: Negative for neck  pain.  Negative for back pain. Integumentary: Negative for rash. Neurological: Negative for headaches, focal weakness or numbness.   ____________________________________________   PHYSICAL EXAM:  VITAL SIGNS: ED Triage Vitals  Enc Vitals Group     BP 07/17/18 2105 107/77     Pulse Rate 07/17/18 2105 63     Resp 07/17/18 2105 18     Temp 07/17/18 2105 98.3 F (36.8 C)     Temp Source 07/17/18 2105 Oral     SpO2 07/17/18 2105 100 %     Weight 07/17/18 2107 78 kg (172 lb)     Height 07/17/18 2107 1.499 m (4\' 11" )     Head Circumference --      Peak Flow --      Pain Score 07/17/18 2110 5     Pain Loc --      Pain Edu? --      Excl. in GC? --     Constitutional: Alert and oriented.  No acute distress Eyes: Conjunctivae are normal.  Head: Atraumatic. Nose: No congestion/rhinnorhea. Mouth/Throat: Mucous membranes are moist.  Almost completely edentulous Neck: No stridor.  No meningeal signs.   Cardiovascular: Normal rate, regular rhythm. Good peripheral circulation. Grossly normal heart sounds. Respiratory: Normal respiratory effort.  No retractions. Lungs CTAB. Gastrointestinal: Soft and nondistended with tenderness to palpation of the epigastrium and bilateral upper quadrants.  Equivocal Murphy sign. Musculoskeletal: No lower extremity tenderness nor edema. No gross deformities of extremities. Neurologic:  Normal speech and language. No gross focal neurologic deficits are appreciated.  Skin:  Skin is warm, dry and intact. No rash noted. Psychiatric: Mood and affect are normal. Speech and behavior are normal.  ____________________________________________   LABS (all labs ordered are listed, but only abnormal results are displayed)  Labs Reviewed  COMPREHENSIVE METABOLIC PANEL - Abnormal; Notable for the following components:      Result Value   Potassium 3.4 (*)    Calcium 8.6 (*)    All other components within normal limits  CBC - Abnormal; Notable for the following  components:   RDW 15.6 (*)    All other components within normal limits  URINALYSIS, COMPLETE (UACMP) WITH MICROSCOPIC - Abnormal; Notable for the following components:   Color, Urine STRAW (*)    APPearance CLEAR (*)    All other components within normal limits  LIPASE, BLOOD  TROPONIN I  POC URINE PREG, ED  POCT PREGNANCY, URINE   ____________________________________________  EKG  ED ECG REPORT I, Loleta Rose, the attending physician, personally viewed and interpreted this ECG.  Date: 07/317/2019 EKG Time: 21:16 Rate: 62 Rhythm: normal sinus rhythm QRS Axis: normal Intervals: normal ST/T Wave abnormalities: Non-specific ST segment / T-wave changes, but no evidence of acute ischemia. Narrative Interpretation: no evidence of acute ischemia   ____________________________________________  RADIOLOGY   ED MD interpretation: No indication of acute abnormalities on right upper quadrant ultrasound  Official  radiology report(s): US Abdomen Limited Ruq  Result Date: 07/18/2018 CLINICAL DATA:  Epigastric and upper abdominal pain, worse after eating. Nausea. Constant pain for several days. EXAM: ULTRASOUND ABDOMEN LIMITED RIGHT UPPER QUADRANT COMPARISON:  CT abdomen and pelvis 03/25/2014 FINDINGS: Gallbladder: No gallstones or wall thickening visualized. No sonographic Murphy sign noted by sonographer. Common bile duct: Diameter: 5.2 mm, normal Liver: Multiple circumscribed hyperechoic lesions demonstrated in the liver. Largest is in the posterior right lobe and measures about 2.7 cm maximal diameter. Based on appearance, these are most likely hemangiomas. They were not identified on the previous CT but may be obscured on noncontrast image. Portal vein is patent on color Doppler imaging with normal direction of blood flow towards the liver. IMPRESSION: 1. No evidence of cholelithiasis or cholecystitis. 2. Multiple hyperechoic circumscribed lesions in the liver, likely hemangiomas.  Electronically Signed   By: Burman Nieves M.D.   On: 07/18/2018 02:33    ____________________________________________   PROCEDURES  Critical Care performed: No   Procedure(s) performed:   Procedures   ____________________________________________   INITIAL IMPRESSION / ASSESSMENT AND PLAN / ED COURSE  As part of my medical decision making, I reviewed the following data within the electronic MEDICAL RECORD NUMBER Nursing notes reviewed and incorporated, Labs reviewed  and Old chart reviewed    Differential diagnosis includes, but is not limited to, biliary colic, acid reflux/GERD, less likely ulcers.  The patient is well-appearing in no acute distress with completely normal vital signs.  Lab work is within normal limits other than a slightly decreased potassium of 3.4.  Her liver enzymes are normal, normal lipase, no leukocytosis, negative urine pregnancy test, and normal urinalysis.  My strong suspicion is very colic and I will evaluate with an ultrasound.  I explained to her that if the ultrasound is normal, we will treat with Carafate and a PPI and recommend outpatient follow-up.  No indication for CT scan at this time.  She understands and agrees with the plan.  Clinical Course as of Jul 18 440  Wed Jul 18, 2018  0237 No acute abnormalities on U/S, multiple areas that likely represent hepatic hemangiomas.  Normal gallbladder.  Will explain to patient and discharge with treatment for GERD as described previously.a  US ABDOMEN LIMITED RUQ [CF]    Clinical Course User Index [CF] Loleta Rose, MD    ____________________________________________  FINAL CLINICAL IMPRESSION(S) / ED DIAGNOSES  Final diagnoses:  Pain of upper abdomen  Nausea  Gastroesophageal reflux disease, esophagitis presence not specified     MEDICATIONS GIVEN DURING THIS VISIT:  Medications - No data to display   ED Discharge Orders         Ordered    sucralfate (CARAFATE) 1 g tablet  4 times daily  PRN     07/18/18 0239    omeprazole (PRILOSEC OTC) 20 MG tablet  Daily,   Status:  Discontinued     07/18/18 0239    ondansetron (ZOFRAN ODT) 4 MG disintegrating tablet     07/18/18 0239    omeprazole (PRILOSEC OTC) 20 MG tablet  Daily     07/18/18 0239           Note:  This document was prepared using Dragon voice recognition software and may include unintentional dictation errors.    Loleta Rose, MD 07/18/18 4270    Loleta Rose, MD 08/01/18 1100

## 2018-07-18 NOTE — ED Notes (Signed)
Pt states reflux medications without relief, denies hx gallbladder. Pain worse after eating and laying down

## 2018-07-18 NOTE — ED Notes (Signed)
ED Provider at bedside. 

## 2018-07-18 NOTE — ED Notes (Signed)
Patient transported to Ultrasound 

## 2018-08-29 ENCOUNTER — Encounter: Payer: Self-pay | Admitting: Gastroenterology

## 2018-08-29 ENCOUNTER — Ambulatory Visit: Payer: PRIVATE HEALTH INSURANCE | Admitting: Gastroenterology

## 2018-08-29 DIAGNOSIS — R110 Nausea: Secondary | ICD-10-CM

## 2018-12-24 ENCOUNTER — Emergency Department: Payer: No Typology Code available for payment source

## 2018-12-24 ENCOUNTER — Emergency Department
Admission: EM | Admit: 2018-12-24 | Discharge: 2018-12-24 | Disposition: A | Discharge status: Home / Self Care | Payer: No Typology Code available for payment source | Attending: Emergency Medicine | Admitting: Emergency Medicine

## 2018-12-24 ENCOUNTER — Other Ambulatory Visit: Payer: Self-pay

## 2018-12-24 DIAGNOSIS — K2900 Acute gastritis without bleeding: Secondary | ICD-10-CM | POA: Insufficient documentation

## 2018-12-24 DIAGNOSIS — R1013 Epigastric pain: Secondary | ICD-10-CM | POA: Diagnosis present

## 2018-12-24 DIAGNOSIS — J45909 Unspecified asthma, uncomplicated: Secondary | ICD-10-CM | POA: Insufficient documentation

## 2018-12-24 DIAGNOSIS — Z79899 Other long term (current) drug therapy: Secondary | ICD-10-CM | POA: Insufficient documentation

## 2018-12-24 LAB — URINALYSIS, COMPLETE (UACMP) WITH MICROSCOPIC
Bilirubin Urine: NEGATIVE
Glucose, UA: NEGATIVE mg/dL
Hgb urine dipstick: NEGATIVE
Ketones, ur: NEGATIVE mg/dL
Leukocytes, UA: NEGATIVE
Nitrite: NEGATIVE
Protein, ur: 30 mg/dL — AB
Specific Gravity, Urine: 1.033 — ABNORMAL HIGH (ref 1.005–1.030)
pH: 6 (ref 5.0–8.0)

## 2018-12-24 LAB — COMPREHENSIVE METABOLIC PANEL
ALT: 17 U/L (ref 0–44)
AST: 31 U/L (ref 15–41)
Albumin: 3.6 g/dL (ref 3.5–5.0)
Alkaline Phosphatase: 40 U/L (ref 38–126)
Anion gap: 6 (ref 5–15)
BUN: 12 mg/dL (ref 6–20)
CO2: 26 mmol/L (ref 22–32)
Calcium: 8.3 mg/dL — ABNORMAL LOW (ref 8.9–10.3)
Chloride: 107 mmol/L (ref 98–111)
Creatinine, Ser: 0.82 mg/dL (ref 0.44–1.00)
GFR calc Af Amer: >60 mL/min (ref >60)
GFR calc non Af Amer: >60 mL/min (ref >60)
Glucose, Bld: 90 mg/dL (ref 70–99)
Potassium: 4.7 mmol/L (ref 3.5–5.1)
Sodium: 139 mmol/L (ref 135–145)
Total Bilirubin: 0.5 mg/dL (ref 0.3–1.2)
Total Protein: 6.5 g/dL (ref 6.5–8.1)

## 2018-12-24 LAB — CBC WITH DIFFERENTIAL/PLATELET
Abs Immature Granulocytes: 0.03 10*3/uL (ref 0.00–0.07)
Basophils Absolute: 0.1 10*3/uL (ref 0.0–0.1)
Basophils Relative: 1 %
Eosinophils Absolute: 0.3 10*3/uL (ref 0.0–0.5)
Eosinophils Relative: 3 %
HCT: 37 % (ref 36.0–46.0)
Hemoglobin: 10.8 g/dL — ABNORMAL LOW (ref 12.0–15.0)
Immature Granulocytes: 0 %
Lymphocytes Relative: 33 %
Lymphs Abs: 2.8 10*3/uL (ref 0.7–4.0)
MCH: 24 pg — ABNORMAL LOW (ref 26.0–34.0)
MCHC: 29.2 g/dL — ABNORMAL LOW (ref 30.0–36.0)
MCV: 82.2 fL (ref 80.0–100.0)
Monocytes Absolute: 0.7 10*3/uL (ref 0.1–1.0)
Monocytes Relative: 8 %
Neutro Abs: 4.7 10*3/uL (ref 1.7–7.7)
Neutrophils Relative %: 55 %
Platelets: 418 10*3/uL — ABNORMAL HIGH (ref 150–400)
RBC: 4.5 MIL/uL (ref 3.87–5.11)
RDW: 13.8 % (ref 11.5–15.5)
WBC: 8.6 10*3/uL (ref 4.0–10.5)
nRBC: 0 % (ref 0.0–0.2)

## 2018-12-24 LAB — POCT PREGNANCY, URINE: Preg Test, Ur: NEGATIVE

## 2018-12-24 LAB — LIPASE, BLOOD: Lipase: 31 U/L (ref 11–51)

## 2018-12-24 LAB — TROPONIN I: Troponin I: <0.03 ng/mL (ref <0.03)

## 2018-12-24 MED ORDER — LIDOCAINE VISCOUS HCL 2 % MT SOLN
15.0000 mL | Freq: Once | OROMUCOSAL | Status: AC
  Administered 2018-12-24: 15 mL via ORAL
  Filled 2018-12-24: qty 15

## 2018-12-24 MED ORDER — FAMOTIDINE 40 MG PO TABS: 40.0000 mg | ORAL_TABLET | Freq: Every day | ORAL | 0 refills | Status: DC

## 2018-12-24 MED ORDER — ALUM & MAG HYDROXIDE-SIMETH 200-200-20 MG/5ML PO SUSP
30.0000 mL | Freq: Once | ORAL | Status: AC
  Administered 2018-12-24: 30 mL via ORAL
  Filled 2018-12-24: qty 30

## 2018-12-24 MED ORDER — ALUM & MAG HYDROXIDE-SIMETH 400-400-40 MG/5ML PO SUSP: 5.0000 mL | Freq: Four times a day (QID) | ORAL | 0 refills | Status: DC | PRN

## 2018-12-24 MED ORDER — ONDANSETRON HCL 4 MG/2ML IJ SOLN
4.0000 mg | Freq: Once | INTRAMUSCULAR | Status: AC
  Administered 2018-12-24: 4 mg via INTRAVENOUS
  Filled 2018-12-24: qty 2

## 2018-12-24 MED ORDER — SODIUM CHLORIDE 0.9 % IV BOLUS
1000.0000 mL | Freq: Once | INTRAVENOUS | Status: AC
  Administered 2018-12-24: 1000 mL via INTRAVENOUS

## 2018-12-24 MED ORDER — FAMOTIDINE IN NACL 20-0.9 MG/50ML-% IV SOLN
20.0000 mg | Freq: Once | INTRAVENOUS | Status: AC
  Administered 2018-12-24: 20 mg via INTRAVENOUS
  Filled 2018-12-24: qty 50

## 2018-12-24 MED ORDER — ONDANSETRON 4 MG PO TBDP: 4.0000 mg | ORAL_TABLET | Freq: Three times a day (TID) | ORAL | 0 refills | Status: DC | PRN

## 2018-12-24 NOTE — ED Triage Notes (Signed)
Pt in with co epigatric pain since Saturday, hx of hiatal hernia. Has no had any n.v.d.

## 2018-12-24 NOTE — ED Notes (Signed)
Sherilyn Cooter RN called the lab to inform them that the label maker does not work. Lab stated that white chart labels will be to put on the samples.

## 2018-12-24 NOTE — ED Notes (Signed)
Pt went to X-Ray.  

## 2018-12-24 NOTE — ED Provider Notes (Signed)
[facility] Emergency Department Provider Note  ____________________________________________  Time seen: Approximately 9:00 PM  I have reviewed the triage vital signs and the nursing notes.   HISTORY  Chief Complaint Abdominal Cramping   HPI Monica Gonzalez is a 43 y.o. female with a history of hiatal hernia and asthma who presents for evaluation of abdominal pain.  She reports 2 days of dull achy epigastric abdominal pain that is constant and associated with nausea.  The pain is 6 out of 10.  Patient reports that she has been taking a lot of over-the-counter Tylenol and ibuprofen for the last 2 days for dental pain.  She is seeing a Patent examiner.  No fever or chills, no vomiting, no diarrhea or constipation.  Patient has had tubal ligation but no other abdominal surgeries.  She denies chest pain or shortness of breath.  Past Medical History:  Diagnosis Date  . Asthma   . Lupus [facility])      Past Surgical History:  Procedure Laterality Date  . TONSILLECTOMY    . TUBAL LIGATION      Prior to Admission medications   Medication Sig Start Date End Date Taking? Authorizing Provider  alum & mag hydroxide-simeth (MAALOX MAX) 400-400-40 MG/5ML suspension Take 5 mLs by mouth every 6 (six) hours as needed for indigestion. 12/24/18   Nita Sickle, MD  amoxicillin (AMOXIL) 500 MG capsule Take 1 capsule (500 mg total) by mouth 3 (three) times daily. 04/28/17   Enid Derry, PA-C  clindamycin (CLEOCIN) 300 MG capsule Take 1 capsule (300 mg total) by mouth 3 (three) times daily. 04/12/16   Rebecka Apley, MD  cyclobenzaprine (FLEXERIL) 10 MG tablet Take 1 tablet (10 mg total) by mouth 3 (three) times daily as needed for muscle spasms. 04/13/16   Hagler, Jami L, PA-C  famotidine (PEPCID) 40 MG tablet Take 1 tablet (40 mg total) by mouth daily. 12/24/18 12/24/19  Nita Sickle, MD  HYDROcodone-acetaminophen [facility]) 5-325 MG tablet Take 1 tablet by mouth every 6  (six) hours as needed for moderate pain. 09/22/16   Irean Hong, MD  ibuprofen (ADVIL,MOTRIN) 800 MG tablet Take 1 tablet (800 mg total) by mouth every 8 (eight) hours as needed. 04/28/17   Enid Derry, PA-C  lidocaine (XYLOCAINE) 2 % solution Use as directed 10 mLs in the mouth or throat as needed for mouth pain. 04/28/17   Enid Derry, PA-C  magic mouthwash SOLN Take 5 mLs by mouth 4 (four) times daily as needed for mouth pain. Please mix 80mL of each ingredient totaling 04/13/16   Hagler, Jami L, PA-C  metoCLOPramide (REGLAN) 10 MG tablet Take 1 tablet (10 mg total) by mouth every 6 (six) hours as needed for nausea or vomiting. 08/15/15   Schaevitz, Myra Rude, MD  omeprazole (PRILOSEC OTC) 20 MG tablet Take 1 tablet (20 mg total) by mouth daily. 07/18/18 07/18/19  Loleta Rose, MD  ondansetron (ZOFRAN ODT) 4 MG disintegrating tablet Take 1 tablet (4 mg total) by mouth every 8 (eight) hours as needed. 12/24/18   Nita Sickle, MD  oxyCODONE-acetaminophen (ROXICET) 5-325 MG per tablet Take 1-2 tablets by mouth every 4 (four) hours as needed for severe pain. 05/05/15   Beers, Charmayne Sheer, PA-C  sucralfate (CARAFATE) 1 g tablet Take 1 tablet (1 g total) by mouth 4 (four) times daily as needed (for abdominal discomfort, nausea, and/or vomiting). 07/18/18   Loleta Rose, MD  traMADol (ULTRAM) 50 MG tablet Take 1 tablet (50  mg total) by mouth every 6 (six) hours as needed. 04/12/16   Rebecka Apley, MD    Allergies Prednisone  Family History  Problem Relation Age of Onset  . Diabetes Mother   . Congestive Heart Failure Mother     Social History Social History   Tobacco Use  . Smoking status: Never Smoker  . Smokeless tobacco: Never Used  Substance Use Topics  . Alcohol use: Yes  . Drug use: No    Review of Systems  Constitutional: Negative for fever. Eyes: Negative for visual changes. ENT: Negative for sore throat. Neck: No neck pain  Cardiovascular: Negative for chest  pain. Respiratory: Negative for shortness of breath. Gastrointestinal: + epigastric abdominal pain and nausea. No vomiting or diarrhea. Genitourinary: Negative for dysuria. Musculoskeletal: Negative for back pain. Skin: Negative for rash. Neurological: Negative for headaches, weakness or numbness. Psych: No SI or HI  ____________________________________________   PHYSICAL EXAM:  VITAL SIGNS: ED Triage Vitals  Enc Vitals Group     BP 12/24/18 2030 (!) 112/51     Pulse Rate 12/24/18 2030 62     Resp 12/24/18 2030 16     Temp 12/24/18 2030 98 F (36.7 C)     Temp Source 12/24/18 2030 Oral     SpO2 12/24/18 2030 100 %     Weight 12/24/18 2030 172 lb (78 kg)     Height 12/24/18 2030 4\' 11"  (1.499 m)     Head Circumference --      Peak Flow --      Pain Score 12/24/18 2033 6     Pain Loc --      Pain Edu? --      Excl. in GC? --     Constitutional: Alert and oriented. Well appearing and in no apparent distress. HEENT:      Head: Normocephalic and atraumatic.         Eyes: Conjunctivae are normal. Sclera is non-icteric.       Mouth/Throat: Mucous membranes are moist.       Neck: Supple with no signs of meningismus. Cardiovascular: Regular rate and rhythm. No murmurs, gallops, or rubs. 2+ symmetrical distal pulses are present in all extremities. No JVD. Respiratory: Normal respiratory effort. Lungs are clear to auscultation bilaterally. No wheezes, crackles, or rhonchi.  Gastrointestinal: Soft, ttp over the epigastrium,, and non distended with positive bowel sounds. No rebound or guarding. Musculoskeletal: Nontender with normal range of motion in all extremities. No edema, cyanosis, or erythema of extremities. Neurologic: Normal speech and language. Face is symmetric. Moving all extremities. No gross focal neurologic deficits are appreciated. Skin: Skin is warm, dry and intact. No rash noted. Psychiatric: Mood and affect are normal. Speech and behavior are  normal.  ____________________________________________   LABS (all labs ordered are listed, but only abnormal results are displayed)  Labs Reviewed  COMPREHENSIVE METABOLIC PANEL - Abnormal; Notable for the following components:      Result Value   Calcium 8.3 (*)    All other components within normal limits  URINALYSIS, COMPLETE (UACMP) WITH MICROSCOPIC - Abnormal; Notable for the following components:   Color, Urine YELLOW (*)    APPearance CLOUDY (*)    Specific Gravity, Urine 1.033 (*)    Protein, ur 30 (*)    Bacteria, UA RARE (*)    All other components within normal limits  CBC WITH DIFFERENTIAL/PLATELET - Abnormal; Notable for the following components:   Hemoglobin 10.8 (*)    MCH 24.0 (*)  MCHC 29.2 (*)    Platelets 418 (*)    All other components within normal limits  LIPASE, BLOOD  TROPONIN I  POCT PREGNANCY, URINE   ____________________________________________  EKG  ED ECG REPORT I, Nita Sicklearolina Sajid Ruppert, the attending physician, personally viewed and interpreted this ECG.  Normal sinus rhythm, rate of 69, normal intervals, normal axis, no ST elevations or depressions.  Normal EKG. ____________________________________________  RADIOLOGY  I have personally reviewed the images performed during this visit and I agree with the Radiologist's read.   Interpretation by Radiologist:  Dg Abdomen 1 View  Result Date: 12/24/2018 CLINICAL DATA:  Abdominal pain for 2 days EXAM: ABDOMEN - 1 VIEW COMPARISON:  None. FINDINGS: Scattered large and small bowel gas is noted. Mild retained fecal material is seen. No obstructive changes are seen. No free air is noted. No bony abnormality is noted. IMPRESSION: No acute abnormality seen. Electronically Signed   By: Alcide CleverMark  Lukens M.D.   On: 12/24/2018 22:42      ____________________________________________   PROCEDURES  Procedure(s) performed: None Procedures Critical Care performed:   None ____________________________________________   INITIAL IMPRESSION / ASSESSMENT AND PLAN / ED COURSE  43 y.o. female with a history of hiatal hernia and asthma who presents for evaluation of epigastric abdominal pain and nausea.  The symptoms started after patient started taking several daily doses of Motrin and Tylenol for teeth pain.  She is well-appearing and in no distress with normal vital signs, abdomen is soft with mild epigastric tenderness, no right lower quadrant or right upper quadrant tenderness, negative Murphy sign.  Patient was seen in the emergency room for similar symptoms 5 months ago and at that time had a right upper quadrant ultrasound that was negative for cholelithiasis.  At this time her presentation is concerning for gastritis versus peptic ulcer disease versus pancreatitis.  No clinical signs of SBO.  Patient has no right lower quadrant tenderness therefore less likely to be appendicitis.  Will treat with Pepcid and Zofran.  Will check basic labs.    _________________________ 11:13 PM on 12/24/2018 -----------------------------------------  Labs and x-rays with no acute findings.  Patient is improved after Zofran and Pepcid.  Discharge home, recommended staying away from NSAIDs, taking Pepcid at home and follow-up with primary care doctor.  Discussed and return precautions.   As part of my medical decision making, I reviewed the following data within the electronic MEDICAL RECORD NUMBER Nursing notes reviewed and incorporated, Labs reviewed , EKG interpreted , Old chart reviewed, Radiograph reviewed , Notes from prior ED visits and [place] Controlled Substance Database    Pertinent labs & imaging results that were available during my care of the patient were reviewed by me and considered in my medical decision making (see chart for details).    ____________________________________________   FINAL CLINICAL IMPRESSION(S) / ED DIAGNOSES  Final diagnoses:  Acute gastritis  without hemorrhage, unspecified gastritis type      NEW MEDICATIONS STARTED DURING THIS VISIT:  ED Discharge Orders         Ordered    famotidine (PEPCID) 40 MG tablet  Daily     12/24/18 2254    alum & mag hydroxide-simeth (MAALOX MAX) 400-400-40 MG/5ML suspension  Every 6 hours PRN     12/24/18 2254    ondansetron (ZOFRAN ODT) 4 MG disintegrating tablet  Every 8 hours PRN     12/24/18 2254           Note:  This document was prepared using  [employer] engine[employer]oice recognition software and may include unintentional dictation errors.    Don PerkingVeronese, [place], MD 12/24/18 [phone]

## 2019-01-09 ENCOUNTER — Encounter: Payer: Self-pay | Admitting: *Deleted

## 2019-01-09 ENCOUNTER — Emergency Department
Admission: EM | Admit: 2019-01-09 | Discharge: 2019-01-09 | Disposition: A | Discharge status: Home / Self Care | Payer: No Typology Code available for payment source | Attending: Emergency Medicine | Admitting: Emergency Medicine

## 2019-01-09 ENCOUNTER — Other Ambulatory Visit: Payer: Self-pay

## 2019-01-09 DIAGNOSIS — J45909 Unspecified asthma, uncomplicated: Secondary | ICD-10-CM | POA: Insufficient documentation

## 2019-01-09 DIAGNOSIS — J101 Influenza due to other identified influenza virus with other respiratory manifestations: Secondary | ICD-10-CM | POA: Diagnosis not present

## 2019-01-09 DIAGNOSIS — R509 Fever, unspecified: Secondary | ICD-10-CM | POA: Diagnosis present

## 2019-01-09 LAB — CBC
HCT: 36.3 % (ref 36.0–46.0)
Hemoglobin: 10.5 g/dL — ABNORMAL LOW (ref 12.0–15.0)
MCH: 24 pg — ABNORMAL LOW (ref 26.0–34.0)
MCHC: 28.9 g/dL — ABNORMAL LOW (ref 30.0–36.0)
MCV: 82.9 fL (ref 80.0–100.0)
Platelets: 365 10*3/uL (ref 150–400)
RBC: 4.38 MIL/uL (ref 3.87–5.11)
RDW: 14.6 % (ref 11.5–15.5)
WBC: 5.8 10*3/uL (ref 4.0–10.5)
nRBC: 0 % (ref 0.0–0.2)

## 2019-01-09 LAB — COMPREHENSIVE METABOLIC PANEL
ALT: 19 U/L (ref 0–44)
AST: 26 U/L (ref 15–41)
Albumin: 3.7 g/dL (ref 3.5–5.0)
Alkaline Phosphatase: 52 U/L (ref 38–126)
Anion gap: 9 (ref 5–15)
BUN: 8 mg/dL (ref 6–20)
CO2: 23 mmol/L (ref 22–32)
Calcium: 8.7 mg/dL — ABNORMAL LOW (ref 8.9–10.3)
Chloride: 107 mmol/L (ref 98–111)
Creatinine, Ser: 0.96 mg/dL (ref 0.44–1.00)
GFR calc Af Amer: >60 mL/min (ref >60)
GFR calc non Af Amer: >60 mL/min (ref >60)
Glucose, Bld: 103 mg/dL — ABNORMAL HIGH (ref 70–99)
Potassium: 3.8 mmol/L (ref 3.5–5.1)
Sodium: 139 mmol/L (ref 135–145)
Total Bilirubin: 0.3 mg/dL (ref 0.3–1.2)
Total Protein: 7.1 g/dL (ref 6.5–8.1)

## 2019-01-09 LAB — LIPASE, BLOOD: Lipase: 36 U/L (ref 11–51)

## 2019-01-09 MED ORDER — ONDANSETRON 4 MG PO TBDP
4.0000 mg | ORAL_TABLET | Freq: Once | ORAL | Status: AC
  Administered 2019-01-09: 4 mg via ORAL
  Filled 2019-01-09: qty 1

## 2019-01-09 MED ORDER — PROMETHAZINE HCL 25 MG PO TABS: 25.0000 mg | ORAL_TABLET | Freq: Four times a day (QID) | ORAL | 0 refills | Status: DC | PRN

## 2019-01-09 MED ORDER — HYDROCOD POLST-CPM POLST ER 10-8 MG/5ML PO SUER
5.0000 mL | Freq: Once | ORAL | Status: AC
  Administered 2019-01-09: 5 mL via ORAL
  Filled 2019-01-09: qty 5

## 2019-01-09 MED ORDER — HYDROCOD POLST-CPM POLST ER 10-8 MG/5ML PO SUER: 5.0000 mL | Freq: Two times a day (BID) | ORAL | 0 refills | Status: DC

## 2019-01-09 NOTE — ED Triage Notes (Signed)
Pt reports she was seen recently and dx with a gastric ulcer and pt reports she has been getting worse ever since. Pt reports continued abd pain that has not changed but continues to hurt when she eats or drinks. Pt has developed sore throat, nasal drainage and cough with generalized body aches. No fevers or vomiting but pt reports nausea and diarrhea.

## 2019-01-09 NOTE — ED Notes (Signed)
Pt placed in room, VS updated, assessment completed. Awaits MD at this time. Call bell within reach.

## 2019-01-09 NOTE — ED Provider Notes (Signed)
[facility] Emergency Department Provider Note       Time seen: ----------------------------------------- 8:14 PM on 01/09/2019 -----------------------------------------   I have reviewed the triage vital signs and the nursing notes.  HISTORY   Chief Complaint Abdominal Pain    HPI Monica Gonzalez is a 43 y.o. female with a history of asthma, lupus who presents to the ED for fevers, chills, body aches, cough with persistent vomiting and diarrhea.  Patient states she was here recently for possible gastric ulcer.  Patient reports flulike symptoms starting about 3 to 4 days ago.  She was sent here from work for evaluation.  Past Medical History:  Diagnosis Date  . Asthma   . Lupus (HCC)     There are no active problems to display for this patient.   Past Surgical History:  Procedure Laterality Date  . TONSILLECTOMY    . TUBAL LIGATION      Allergies Prednisone  Social History Social History   Tobacco Use  . Smoking status: Never Smoker  . Smokeless tobacco: Never Used  Substance Use Topics  . Alcohol use: Yes  . Drug use: No   Review of Systems Constitutional: Positive for fevers, chills, body aches Cardiovascular: Negative for chest pain. Respiratory: Negative for shortness of breath. Gastrointestinal: positive for vomiting and diarrhea Musculoskeletal: Positive for myalgias Skin: Negative for rash. Neurological: Negative for headaches, focal weakness or numbness.  All systems negative/normal/unremarkable except as stated in the HPI  ____________________________________________   PHYSICAL EXAM:  VITAL SIGNS: ED Triage Vitals  Enc Vitals Group     BP 01/09/19 1725 112/66     Pulse Rate 01/09/19 1725 91     Resp 01/09/19 1725 18     Temp 01/09/19 1725 98.3 F (36.8 C)     Temp Source 01/09/19 1725 Oral     SpO2 01/09/19 1725 99 %     Weight 01/09/19 1726 172 lb (78 kg)     Height 01/09/19 1726 4\' 11"  (1.499 m)     Head  Circumference --      Peak Flow --      Pain Score 01/09/19 1732 6     Pain Loc --      Pain Edu? --      Excl. in GC? --    Constitutional: Alert and oriented. Well appearing and in no distress. Eyes: Conjunctivae are normal. Normal extraocular movements. ENT      Head: Normocephalic and atraumatic.      Nose: No congestion/rhinnorhea.      Mouth/Throat: Mucous membranes are moist.      Neck: No stridor. Cardiovascular: Normal rate, regular rhythm. No murmurs, rubs, or gallops. Respiratory: Normal respiratory effort without tachypnea nor retractions. Breath sounds are clear and equal bilaterally. No wheezes/rales/rhonchi. Gastrointestinal: Soft and nontender. Normal bowel sounds Musculoskeletal: Nontender with normal range of motion in extremities. No lower extremity tenderness nor edema. Neurologic:  Normal speech and language. No gross focal neurologic deficits are appreciated.  Skin:  Skin is warm, dry and intact. No rash noted. Psychiatric: Mood and affect are normal. Speech and behavior are normal.  ____________________________________________  ED COURSE:  As part of my medical decision making, I reviewed the following data within the electronic MEDICAL RECORD NUMBER History obtained from family if available, nursing notes, old chart and ekg, as well as notes from prior ED visits. Patient presented for influenza, we will assess with labs and provide symptomatic treatment   Procedures ____________________________________________   LABS (  pertinent positives/negatives)  Labs Reviewed  COMPREHENSIVE METABOLIC PANEL - Abnormal; Notable for the following components:      Result Value   Glucose, Bld 103 (*)    Calcium 8.7 (*)    All other components within normal limits  CBC - Abnormal; Notable for the following components:   Hemoglobin 10.5 (*)    MCH 24.0 (*)    MCHC 28.9 (*)    All other components within normal limits  LIPASE, BLOOD    ___________________________________________   DIFFERENTIAL DIAGNOSIS   Influenza, dehydration, electrolyte abnormality, GERD, gastric ulcer  FINAL ASSESSMENT AND PLAN  Influenza   Plan: The patient had presented for classic H1 N1 symptoms. Patient's labs were reassuring.  Patient was given cough medicine as well as further antiemetics.  Labs did not indicate significant dehydration.  She will be encouraged to stay out of work for 3-4 more days.  She is cleared for outpatient follow-up.   Ulice Dash, MD    Note: This note was generated in part or whole with voice recognition software. Voice recognition is usually quite accurate but there are transcription errors that can and very often do occur. I apologize for any typographical errors that were not detected and corrected.     Emily Filbert, MD 01/09/19 2024

## 2019-03-10 ENCOUNTER — Other Ambulatory Visit: Payer: Self-pay

## 2019-03-10 ENCOUNTER — Encounter: Payer: Self-pay | Admitting: Emergency Medicine

## 2019-03-10 ENCOUNTER — Emergency Department: Payer: No Typology Code available for payment source

## 2019-03-10 ENCOUNTER — Emergency Department
Admission: EM | Admit: 2019-03-10 | Discharge: 2019-03-10 | Disposition: A | Discharge status: Home / Self Care | Payer: No Typology Code available for payment source | Attending: Emergency Medicine | Admitting: Emergency Medicine

## 2019-03-10 DIAGNOSIS — Z20828 Contact with and (suspected) exposure to other viral communicable diseases: Secondary | ICD-10-CM | POA: Insufficient documentation

## 2019-03-10 DIAGNOSIS — Z79899 Other long term (current) drug therapy: Secondary | ICD-10-CM | POA: Diagnosis not present

## 2019-03-10 DIAGNOSIS — R0602 Shortness of breath: Secondary | ICD-10-CM | POA: Diagnosis present

## 2019-03-10 DIAGNOSIS — Z7189 Other specified counseling: Secondary | ICD-10-CM

## 2019-03-10 DIAGNOSIS — J4521 Mild intermittent asthma with (acute) exacerbation: Secondary | ICD-10-CM

## 2019-03-10 MED ORDER — AMOXICILLIN-POT CLAVULANATE 875-125 MG PO TABS: 1.0000 | ORAL_TABLET | Freq: Two times a day (BID) | ORAL | 0 refills | Status: DC

## 2019-03-10 MED ORDER — ALBUTEROL SULFATE HFA 108 (90 BASE) MCG/ACT IN AERS: 2.0000 | INHALATION_SPRAY | RESPIRATORY_TRACT | 1 refills | Status: DC | PRN

## 2019-03-10 MED ORDER — AMOXICILLIN-POT CLAVULANATE 875-125 MG PO TABS
1.0000 | ORAL_TABLET | Freq: Once | ORAL | Status: AC
  Administered 2019-03-10: 21:00:00 1 via ORAL
  Filled 2019-03-10: qty 1

## 2019-03-10 MED ORDER — BENZONATATE 100 MG PO CAPS: 100.0000 mg | ORAL_CAPSULE | Freq: Three times a day (TID) | ORAL | 0 refills | Status: DC | PRN

## 2019-03-10 NOTE — Discharge Instructions (Signed)
You should remain on self-quarantine at home until your Coronavirus result is available, which should take 2-3 days.  If negative and you are feeling better, you may return to work at that time.      Person Under Monitoring Name: Monica Gonzalez  Location: [address] [place] [address]   Infection Prevention Recommendations for Individuals Confirmed to have, or Being Evaluated for, 2019 Novel Coronavirus (COVID-19) Infection Who Receive Care at Home  Individuals who are confirmed to have, or are being evaluated for, COVID-19 should follow the prevention steps below until a healthcare provider or local or state health department says they can return to normal activities.  Stay home except to get medical care You should restrict activities outside your home, except for getting medical care. Do not go to work, school, or public areas, and do not use public transportation or taxis.  Call ahead before visiting your doctor Before your medical appointment, call the healthcare provider and tell them that you have, or are being evaluated for, COVID-19 infection. This will help the healthcare providers office take steps to keep other people from getting infected. Ask your healthcare provider to call the local or state health department.  Monitor your symptoms Seek prompt medical attention if your illness is worsening (e.g., difficulty breathing). Before going to your medical appointment, call the healthcare provider and tell them that you have, or are being evaluated for, COVID-19 infection. Ask your healthcare provider to call the local or state health department.  Wear a facemask You should wear a facemask that covers your nose and mouth when you are in the same room with other people and when you visit a healthcare provider. People who live with or visit you should also wear a facemask while they are in the same room with you.  Separate yourself from other people in  your home As much as possible, you should stay in a different room from other people in your home. Also, you should use a separate bathroom, if available.  Avoid sharing household items You should not share dishes, drinking glasses, cups, eating utensils, towels, bedding, or other items with other people in your home. After using these items, you should wash them thoroughly with soap and water.  Cover your coughs and sneezes Cover your mouth and nose with a tissue when you cough or sneeze, or you can cough or sneeze into your sleeve. Throw used tissues in a lined trash can, and immediately wash your hands with soap and water for at least 20 seconds or use an alcohol-based hand rub.  Wash your [employer] your hands often and thoroughly with soap and water for at least 20 seconds. You can use an alcohol-based hand sanitizer if soap and water are not available and if your hands are not visibly dirty. Avoid touching your eyes, nose, and mouth with unwashed hands.   Prevention Steps for Caregivers and Household Members of Individuals Confirmed to have, or Being Evaluated for, COVID-19 Infection Being Cared for in the Home  If you live with, or provide care at home for, a person confirmed to have, or being evaluated for, COVID-19 infection please follow these guidelines to prevent infection:  Follow healthcare providers instructions Make sure that you understand and can help the patient follow any healthcare provider instructions for all care.  Provide for the patients basic needs You should help the patient with basic needs in the home and provide support for getting groceries, prescriptions, and  other personal needs.  Monitor the patients symptoms If they are getting sicker, call his or her medical provider and tell them that the patient has, or is being evaluated for, COVID-19 infection. This will help the healthcare providers office take steps to keep other people from getting  infected. Ask the healthcare provider to call the local or state health department.  Limit the number of people who have contact with the patient If possible, have only one caregiver for the patient. Other household members should stay in another home or place of residence. If this is not possible, they should stay in another room, or be separated from the patient as much as possible. Use a separate bathroom, if available. Restrict visitors who do not have an essential need to be in the home.  Keep older adults, very young children, and other sick people away from the patient Keep older adults, very young children, and those who have compromised immune systems or chronic health conditions away from the patient. This includes people with chronic heart, lung, or kidney conditions, diabetes, and cancer.  Ensure good ventilation Make sure that shared spaces in the home have good air flow, such as from an air conditioner or an opened window, weather permitting.  Wash your hands often Wash your hands often and thoroughly with soap and water for at least 20 seconds. You can use an alcohol based hand sanitizer if soap and water are not available and if your hands are not visibly dirty. Avoid touching your eyes, nose, and mouth with unwashed hands. Use disposable paper towels to dry your hands. If not available, use dedicated cloth towels and replace them when they become wet.  Wear a facemask and gloves Wear a disposable facemask at all times in the room and gloves when you touch or have contact with the patients blood, body fluids, and/or secretions or excretions, such as sweat, saliva, sputum, nasal mucus, vomit, urine, or feces.  Ensure the mask fits over your nose and mouth tightly, and do not touch it during use. Throw out disposable facemasks and gloves after using them. Do not reuse. Wash your hands immediately after removing your facemask and gloves. If your personal clothing becomes  contaminated, carefully remove clothing and launder. Wash your hands after handling contaminated clothing. Place all used disposable facemasks, gloves, and other waste in a lined container before disposing them with other household waste. Remove gloves and wash your hands immediately after handling these items.  Do not share dishes, glasses, or other household items with the patient Avoid sharing household items. You should not share dishes, drinking glasses, cups, eating utensils, towels, bedding, or other items with a patient who is confirmed to have, or being evaluated for, COVID-19 infection. After the person uses these items, you should wash them thoroughly with soap and water.  Wash laundry thoroughly Immediately remove and wash clothes or bedding that have blood, body fluids, and/or secretions or excretions, such as sweat, saliva, sputum, nasal mucus, vomit, urine, or feces, on them. Wear gloves when handling laundry from the patient. Read and follow directions on labels of laundry or clothing items and detergent. In general, wash and dry with the warmest temperatures recommended on the label.  Clean all areas the individual has used often Clean all touchable surfaces, such as counters, tabletops, doorknobs, bathroom fixtures, toilets, phones, keyboards, tablets, and bedside tables, every day. Also, clean any surfaces that may have blood, body fluids, and/or secretions or excretions on them. Wear gloves when cleaning  surfaces the patient has come in contact with. Use a diluted bleach solution (e.g., dilute bleach with 1 part bleach and 10 parts water) or a household disinfectant with a label that says EPA-registered for coronaviruses. To make a bleach solution at home, add 1 tablespoon of bleach to 1 quart (4 cups) of water. For a larger supply, add  cup of bleach to 1 gallon (16 cups) of water. Read labels of cleaning products and follow recommendations provided on product labels. Labels  contain instructions for safe and effective use of the cleaning product including precautions you should take when applying the product, such as wearing gloves or eye protection and making sure you have good ventilation during use of the product. Remove gloves and wash hands immediately after cleaning.  Monitor yourself for signs and symptoms of illness Caregivers and household members are considered close contacts, should monitor their health, and will be asked to limit movement outside of the home to the extent possible. Follow the monitoring steps for close contacts listed on the symptom monitoring form.   ? If you have additional questions, contact your local health department or call the epidemiologist on call at [phone] (available 24/7). ? This guidance is subject to change. For the most up-to-date guidance from [facility], please refer to their website: [email or web address]

## 2019-03-10 NOTE — ED Notes (Signed)
Waiting on lab to send swabs at this time.

## 2019-03-10 NOTE — ED Provider Notes (Signed)
[facility] Emergency Department Provider Note  ____________________________________________  Time seen: Approximately 8:45 PM  I have reviewed the triage vital signs and the nursing notes.   HISTORY  Chief Complaint Shortness of Breath    HPI Monica Gonzalez is a 43 y.o. female with a history of asthma and lupus who comes to the ED complaining of gradual onset of shortness of breath for the past week associated with nonproductive cough.  No fevers chills or sweats.  No sore throat rhinorrhea abdominal pain nausea vomiting or diarrhea.  No loss of sense of taste or smell.  No body aches.  She does report pain in bilateral lower ribs with coughing.  Symptoms are waxing and waning, cough is provoked by deep breathing.  Took her albuterol inhaler at home which is several years old, without relief of symptoms.  No recent travel trauma hospitalization or surgery.  No history of DVT or PE.  Not taking any medications including exogenous hormones.  Patient works as a Scientist, clinical (histocompatibility and immunogenetics) at [employer], SNF   Past Medical History:  Diagnosis Date  . Asthma   . Lupus (HCC)      There are no active problems to display for this patient.    Past Surgical History:  Procedure Laterality Date  . TONSILLECTOMY    . TUBAL LIGATION       Prior to Admission medications   Medication Sig Start Date End Date Taking? Authorizing Provider  albuterol (PROVENTIL HFA) 108 (90 Base) MCG/ACT inhaler Inhale 2 puffs into the lungs every 4 (four) hours as needed for wheezing or shortness of breath. 03/10/19   Sharman Cheek, MD  alum & mag hydroxide-simeth (MAALOX MAX) 400-400-40 MG/5ML suspension Take 5 mLs by mouth every 6 (six) hours as needed for indigestion. 12/24/18   Nita Sickle, MD  amoxicillin (AMOXIL) 500 MG capsule Take 1 capsule (500 mg total) by mouth 3 (three) times daily. 04/28/17   Enid Derry, PA-C  amoxicillin-clavulanate (AUGMENTIN) 875-125 MG tablet  Take 1 tablet by mouth 2 (two) times daily. 03/10/19   Sharman Cheek, MD  benzonatate (TESSALON PERLES) 100 MG capsule Take 1 capsule (100 mg total) by mouth 3 (three) times daily as needed for cough. 03/10/19 03/09/20  Sharman Cheek, MD  chlorpheniramine-HYDROcodone [facility]) 10-8 MG/5ML SUER Take 5 mLs by mouth 2 (two) times daily. 01/09/19   Emily Filbert, MD  clindamycin (CLEOCIN) 300 MG capsule Take 1 capsule (300 mg total) by mouth 3 (three) times daily. 04/12/16   Rebecka Apley, MD  cyclobenzaprine (FLEXERIL) 10 MG tablet Take 1 tablet (10 mg total) by mouth 3 (three) times daily as needed for muscle spasms. 04/13/16   Hagler, Jami L, PA-C  famotidine (PEPCID) 40 MG tablet Take 1 tablet (40 mg total) by mouth daily. 12/24/18 12/24/19  Nita Sickle, MD  HYDROcodone-acetaminophen [facility]) 5-325 MG tablet Take 1 tablet by mouth every 6 (six) hours as needed for moderate pain. 09/22/16   Irean Hong, MD  ibuprofen (ADVIL,MOTRIN) 800 MG tablet Take 1 tablet (800 mg total) by mouth every 8 (eight) hours as needed. 04/28/17   Enid Derry, PA-C  lidocaine (XYLOCAINE) 2 % solution Use as directed 10 mLs in the mouth or throat as needed for mouth pain. 04/28/17   Enid Derry, PA-C  magic mouthwash SOLN Take 5 mLs by mouth 4 (four) times daily as needed for mouth pain. Please mix 60mL of each ingredient totaling 04/13/16   Hagler, Jami L,  PA-C  metoCLOPramide (REGLAN) 10 MG tablet Take 1 tablet (10 mg total) by mouth every 6 (six) hours as needed for nausea or vomiting. 08/15/15   Schaevitz, Myra Rude, MD  omeprazole (PRILOSEC OTC) 20 MG tablet Take 1 tablet (20 mg total) by mouth daily. 07/18/18 07/18/19  Loleta Rose, MD  ondansetron (ZOFRAN ODT) 4 MG disintegrating tablet Take 1 tablet (4 mg total) by mouth every 8 (eight) hours as needed. 12/24/18   Nita Sickle, MD  oxyCODONE-acetaminophen (ROXICET) 5-325 MG per tablet Take 1-2 tablets by mouth every 4  (four) hours as needed for severe pain. 05/05/15   Beers, Charmayne Sheer, PA-C  promethazine (PHENERGAN) 25 MG tablet Take 1 tablet (25 mg total) by mouth every 6 (six) hours as needed for nausea or vomiting. 01/09/19   Emily Filbert, MD  sucralfate (CARAFATE) 1 g tablet Take 1 tablet (1 g total) by mouth 4 (four) times daily as needed (for abdominal discomfort, nausea, and/or vomiting). 07/18/18   Loleta Rose, MD  traMADol (ULTRAM) 50 MG tablet Take 1 tablet (50 mg total) by mouth every 6 (six) hours as needed. 04/12/16   Rebecka Apley, MD     Allergies Prednisone   Family History  Problem Relation Age of Onset  . Diabetes Mother   . Congestive Heart Failure Mother     Social History Social History   Tobacco Use  . Smoking status: Never Smoker  . Smokeless tobacco: Never Used  Substance Use Topics  . Alcohol use: Yes  . Drug use: No    Review of Systems  Constitutional:   No fever or chills.  ENT:   No sore throat. No rhinorrhea. Cardiovascular: Positive as above chest pain without syncope. Respiratory: Positive shortness of breath and nonproductive cough. Gastrointestinal:   Negative for abdominal pain, vomiting and diarrhea.  Musculoskeletal:   Negative for focal pain or swelling All other systems reviewed and are negative except as documented above in ROS and HPI.  ____________________________________________   PHYSICAL EXAM:  VITAL SIGNS: ED Triage Vitals  Enc Vitals Group     BP 03/10/19 1950 114/61     Pulse Rate 03/10/19 1950 82     Resp 03/10/19 1950 19     Temp 03/10/19 1950 98.1 F (36.7 C)     Temp Source 03/10/19 1950 Oral     SpO2 03/10/19 1950 100 %     Weight 03/10/19 1947 172 lb (78 kg)     Height 03/10/19 1947  (1.499 m)     Head Circumference --      Peak Flow --      Pain Score 03/10/19 1947 5     Pain Loc --      Pain Edu? --      Excl. in GC? --     Vital signs reviewed, nursing assessments reviewed.   Constitutional:    Alert and oriented. Non-toxic appearance. Eyes:   Conjunctivae are normal. EOMI. PERRL. ENT      Head:   Normocephalic and atraumatic.      Nose:   No congestion/rhinnorhea.       Mouth/Throat:   MMM, no pharyngeal erythema. No peritonsillar mass.       Neck:   No meningismus. Full ROM. Hematological/Lymphatic/Immunilogical:   No cervical lymphadenopathy. Cardiovascular:   RRR. Symmetric bilateral radial and DP pulses.  No murmurs. Cap refill less than 2 seconds. Respiratory:   Normal respiratory effort without tachypnea/retractions. Breath sounds are clear and equal  bilaterally. No wheezes/rales/rhonchi with normal breathing.  FEV1 maneuver provokes a coughing fit and expiratory wheezing. Gastrointestinal:   Soft and nontender. Non distended. There is no CVA tenderness.  No rebound, rigidity, or guarding. Musculoskeletal:   Normal range of motion in all extremities. No joint effusions.  No lower extremity tenderness.  No edema. Neurologic:   Normal speech and language.  Motor grossly intact. No acute focal neurologic deficits are appreciated.  Skin:    Skin is warm, dry and intact. No rash noted.  No petechiae, purpura, or bullae.  ____________________________________________    LABS (pertinent positives/negatives) (all labs ordered are listed, but only abnormal results are displayed) Labs Reviewed  NOVEL CORONAVIRUS, NAA (HOSPITAL ORDER, SEND-OUT TO REF LAB)   ____________________________________________   EKG  Interpreted by me Normal sinus rhythm rate of 82, normal axis intervals QRS ST segments and T waves  ____________________________________________    RADIOLOGY  No results found.  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    CLINICAL IMPRESSION / ASSESSMENT AND PLAN / ED COURSE  Medications ordered in the ED: Medications  amoxicillin-clavulanate (AUGMENTIN) 875-125 MG per tablet 1 tablet (has no administration in time range)    Pertinent labs & imaging results that were available during my care of the patient were reviewed by me and considered in my medical decision making (see chart for details).  Mathis DadBrandy M Urich was evaluated in Emergency Department on 03/10/2019 for the symptoms described in the history of present illness. She was evaluated in the context of the global COVID-19 pandemic, which necessitated consideration that the patient might be at risk for infection with the SARS-CoV-2 virus that causes COVID-19. Institutional protocols and algorithms that pertain to the evaluation of patients at risk for COVID-19 are in a state of rapid change based on information released by regulatory bodies including the CDC and federal and state organizations. These policies and algorithms were followed during the patient's care in the ED.   Patient presents with shortness of breath cough wheezing on exam.  Breath sounds are otherwise symmetric and clear.  Likely uncomplicated asthma exacerbation.  Due to her allergy to prednisone in the past and unclear what steroids might be safe for her we will need to forego these.  Her vital signs are unremarkable, not tachypneic or increased work of breathing or hypoxia so symptomatic management with albuterol, antibiotic coverage with Augmentin, and outpatient coronavirus testing is sufficient without steroids.  Return precautions for any worsening symptoms including shortness of breath severe fatigue or syncope.      ____________________________________________   FINAL CLINICAL IMPRESSION(S) / ED  DIAGNOSES    Final diagnoses:  Mild intermittent asthma with exacerbation  Advice Given About Covid-19 Virus Infection     ED Discharge Orders         Ordered    amoxicillin-clavulanate (AUGMENTIN) 875-125 MG tablet  2 times daily     03/10/19 2042    benzonatate (TESSALON PERLES) 100 MG capsule  3 times daily PRN     03/10/19 2042    albuterol (PROVENTIL HFA) 108 (90 Base) MCG/ACT inhaler  Every 4 hours PRN     03/10/19 2042          Portions of this  note were generated with dragon dictation software. Dictation errors may occur despite best attempts at proofreading.   Sharman Cheek, MD 03/10/19 2049

## 2019-03-10 NOTE — ED Triage Notes (Signed)
Pt arrives POV and ambulatory to triage with c/o SOB x 1 week. Pt states that she has a cough as well that is sometimes productive. Pt has clear lung sounds throughout all fields at this time. Pt also reports using her asthma inhaler without relief at home. Pt is able to talk in complete sentences at this time and is in NAD.

## 2019-03-12 LAB — NOVEL CORONAVIRUS, NAA (HOSP ORDER, SEND-OUT TO REF LAB; TAT 18-24 HRS): SARS-CoV-2, NAA: NOT DETECTED

## 2019-05-20 ENCOUNTER — Emergency Department
Admission: EM | Admit: 2019-05-20 | Discharge: 2019-05-20 | Disposition: A | Discharge status: Home / Self Care | Payer: No Typology Code available for payment source | Attending: Emergency Medicine | Admitting: Emergency Medicine

## 2019-05-20 ENCOUNTER — Emergency Department: Payer: No Typology Code available for payment source

## 2019-05-20 ENCOUNTER — Other Ambulatory Visit: Payer: Self-pay

## 2019-05-20 DIAGNOSIS — Z20828 Contact with and (suspected) exposure to other viral communicable diseases: Secondary | ICD-10-CM | POA: Insufficient documentation

## 2019-05-20 DIAGNOSIS — J45909 Unspecified asthma, uncomplicated: Secondary | ICD-10-CM | POA: Insufficient documentation

## 2019-05-20 DIAGNOSIS — Z79899 Other long term (current) drug therapy: Secondary | ICD-10-CM | POA: Insufficient documentation

## 2019-05-20 DIAGNOSIS — R05 Cough: Secondary | ICD-10-CM | POA: Diagnosis present

## 2019-05-20 DIAGNOSIS — J069 Acute upper respiratory infection, unspecified: Secondary | ICD-10-CM

## 2019-05-20 MED ORDER — BENZONATATE 100 MG PO CAPS: 100.0000 mg | ORAL_CAPSULE | Freq: Three times a day (TID) | ORAL | 0 refills | Status: AC | PRN

## 2019-05-20 MED ORDER — OXYMETAZOLINE HCL 0.05 % NA SOLN: 2.0000 | Freq: Two times a day (BID) | NASAL | 0 refills | Status: AC

## 2019-05-20 NOTE — ED Triage Notes (Signed)
Sore throat, cough, body aches x 1 week. Pt alert and oriented X4, active, cooperative, pt in NAD. RR even and unlabored, color WNL.

## 2019-05-20 NOTE — ED Notes (Signed)
See triage note  Presents with cough low grade temp and sore throat   States sx's started about 3 days ago.. has tried inhalers and allergy meds w/o relief

## 2019-05-20 NOTE — Discharge Instructions (Addendum)
Your COVID test is pending.  These self isolate.  Your chest x-ray is reassuring.  You can take Tylenol for low-grade fever.

## 2019-05-20 NOTE — ED Provider Notes (Signed)
[facility] Emergency Department Provider Note  ____________________________________________  Time seen: Approximately 8:02 PM  I have reviewed the triage vital signs and the nursing notes.   HISTORY  Chief Complaint Sore Throat and Cough    HPI Monica Gonzalez is a 43 y.o. female that presents to the emergency department for evaluation of low-grade fever, nasal congestion, sore throat, nonproductive cough, shortness of breath with coughing, body aches for 3 days.  Patient had some diarrhea on Friday, none since.  Son also has nasal congestion.  Patient works at [employer].  No known contacts with coated 19.  Patient has used her asthma inhalers and allergy medications.   Past Medical History:  Diagnosis Date  . Asthma   . Lupus (HCC)     There are no active problems to display for this patient.   Past Surgical History:  Procedure Laterality Date  . TONSILLECTOMY    . TUBAL LIGATION      Prior to Admission medications   Medication Sig Start Date End Date Taking? Authorizing Provider  albuterol (PROVENTIL HFA) 108 (90 Base) MCG/ACT inhaler Inhale 2 puffs into the lungs every 4 (four) hours as needed for wheezing or shortness of breath. 03/10/19   Sharman CheekStafford, Phillip, MD  alum & mag hydroxide-simeth (MAALOX MAX) 400-400-40 MG/5ML suspension Take 5 mLs by mouth every 6 (six) hours as needed for indigestion. 12/24/18   Nita SickleVeronese, [place], MD  amoxicillin (AMOXIL) 500 MG capsule Take 1 capsule (500 mg total) by mouth 3 (three) times daily. 04/28/17   Enid DerryWagner, Jasiri Hanawalt, PA-C  benzonatate (TESSALON PERLES) 100 MG capsule Take 1 capsule (100 mg total) by mouth 3 (three) times daily as needed. 05/20/19 05/19/20  Enid DerryWagner, Mischele Detter, PA-C  famotidine (PEPCID) 40 MG tablet Take 1 tablet (40 mg total) by mouth daily. 12/24/18 12/24/19  Nita SickleVeronese, [place], MD  ibuprofen (ADVIL,MOTRIN) 800 MG tablet Take 1 tablet (800 mg total) by mouth every 8 (eight) hours as needed. 04/28/17    Enid DerryWagner, Renard Caperton, PA-C  lidocaine (XYLOCAINE) 2 % solution Use as directed 10 mLs in the mouth or throat as needed for mouth pain. 04/28/17   Enid DerryWagner, Tiahna Cure, PA-C  oxymetazoline (AFRIN) 0.05 % nasal spray Place 2 sprays into both nostrils 2 (two) times daily for 3 days. 05/20/19 05/23/19  Enid DerryWagner, Samara Stankowski, PA-C    Allergies Prednisone  Family History  Problem Relation Age of Onset  . Diabetes Mother   . Congestive Heart Failure Mother     Social History Social History   Tobacco Use  . Smoking status: Never Smoker  . Smokeless tobacco: Never Used  Substance Use Topics  . Alcohol use: Yes  . Drug use: No     Review of Systems  Constitutional: Positive for low-grade fever. Eyes: No visual changes. No discharge. ENT: Positive for congestion and rhinorrhea. Cardiovascular: No chest pain. Respiratory: Positive for cough.  Gastrointestinal: No abdominal pain.  No nausea, no vomiting.  Positive for diarrhea x1.  No constipation. Musculoskeletal: Positive for body aches. Skin: Negative for rash, abrasions, lacerations, ecchymosis. Neurological: Negative for headaches.   ____________________________________________   PHYSICAL EXAM:  VITAL SIGNS: ED Triage Vitals  Enc Vitals Group     BP 05/20/19 1739 117/88     Pulse Rate 05/20/19 1739 100     Resp 05/20/19 1739 18     Temp 05/20/19 1739 99.6 F (37.6 C)     Temp Source 05/20/19 1739 Oral     SpO2 05/20/19 1739 99 %  Weight 05/20/19 1740 185 lb (83.9 kg)     Height 05/20/19 1740 4\' 11"  (1.499 m)     Head Circumference --      Peak Flow --      Pain Score 05/20/19 1740 6     Pain Loc --      Pain Edu? --      Excl. in [place]? --      Constitutional: Alert and oriented. Well appearing and in no acute distress. Eyes: Conjunctivae are normal. PERRL. EOMI. No discharge. Head: Atraumatic. ENT: No frontal and maxillary sinus tenderness.      Ears: Tympanic membranes pearly gray with good landmarks. No discharge.      Nose:  Mild congestion/rhinnorhea.      Mouth/Throat: Mucous membranes are moist. Oropharynx non-erythematous. Tonsils absent. No exudates.  Neck: No stridor.   Hematological/Lymphatic/Immunilogical: No cervical lymphadenopathy. Cardiovascular: Normal rate, regular rhythm.  Good peripheral circulation. Respiratory: Normal respiratory effort without tachypnea or retractions. Lungs CTAB. Good air entry to the bases with no decreased or absent breath sounds. Gastrointestinal: Bowel sounds 4 quadrants. Soft and nontender to palpation. No guarding or rigidity. No palpable masses. No distention. Musculoskeletal: Full range of motion to all extremities. No gross deformities appreciated. Neurologic:  Normal speech and language. No gross focal neurologic deficits are appreciated.  Skin:  Skin is warm, dry and intact. No rash noted. Psychiatric: Mood and affect are normal. Speech and behavior are normal. Patient exhibits appropriate insight and judgement.   ____________________________________________   LABS (all labs ordered are listed, but only abnormal results are displayed)  Labs Reviewed  NOVEL CORONAVIRUS, NAA (HOSPITAL ORDER, SEND-OUT TO REF LAB)   ____________________________________________  EKG   ____________________________________________  RADIOLOGY Lexine BatonI, Birgitta Uhlir, personally viewed and evaluated these images (plain radiographs) as part of my medical decision making, as well as reviewing the written report by the radiologist.  Dg Chest Portable 1 View  Result Date: 05/20/2019 CLINICAL DATA:  Sore throat cough and body aches for 1 week. EXAM: PORTABLE CHEST 1 VIEW COMPARISON:  03/10/2019 FINDINGS: Cardiomediastinal silhouette is normal. Mediastinal contours appear intact. There is no evidence of focal airspace consolidation, pleural effusion or pneumothorax. Osseous structures are without acute abnormality. Soft tissues are grossly normal. IMPRESSION: No active disease. Electronically  Signed   By: Ted Mcalpineobrinka  Dimitrova M.D.   On: 05/20/2019 19:16    ____________________________________________    PROCEDURES  Procedure(s) performed:    Procedures    Medications - No data to display   ____________________________________________   INITIAL IMPRESSION / ASSESSMENT AND PLAN / ED COURSE  Pertinent labs & imaging results that were available during my care of the patient were reviewed by me and considered in my medical decision making (see chart for details).  Review of the [place] CSRS was performed in accordance of the [facility] prior to dispensing any controlled drugs.   Patient's diagnosis is consistent with viral URI. Vital signs and exam are reassuring.  Chest x-ray is negative for acute cardiopulmonary processes.  COVID test is pending.  Patient will self isolate.  Patient feels comfortable going home. Patient will be discharged home with prescriptions for Tessalon Perles and Afrin.  Patient is to follow up with primary care and health department as needed or otherwise directed. Patient is given ED precautions to return to the ED for any worsening or new symptoms.  Monica DadBrandy M Frazer was evaluated in Emergency Department on 05/20/2019 for the symptoms described in the history of present illness. She was evaluated  in the context of the global COVID-19 pandemic, which necessitated consideration that the patient might be at risk for infection with the SARS-CoV-2 virus that causes COVID-19. Institutional protocols and algorithms that pertain to the evaluation of patients at risk for COVID-19 are in a state of rapid change based on information released by regulatory bodies including the CDC and federal and state organizations. These policies and algorithms were followed during the patient's care in the ED.   ____________________________________________  FINAL CLINICAL IMPRESSION(S) / ED DIAGNOSES  Final diagnoses:  Viral URI with cough      NEW MEDICATIONS STARTED DURING THIS  VISIT:  ED Discharge Orders         Ordered    benzonatate (TESSALON PERLES) 100 MG capsule  3 times daily PRN     05/20/19 2011    oxymetazoline (AFRIN) 0.05 % nasal spray  2 times daily     05/20/19 2011              This chart was dictated using voice recognition software/Dragon. Despite best efforts to proofread, errors can occur which can change the meaning. Any change was purely unintentional.    Laban Emperor, PA-C 05/20/19 2116    Earleen Newport, MD 05/20/19 2214

## 2019-05-22 LAB — NOVEL CORONAVIRUS, NAA (HOSP ORDER, SEND-OUT TO REF LAB; TAT 18-24 HRS): SARS-CoV-2, NAA: NOT DETECTED

## 2019-06-13 DIAGNOSIS — M329 Systemic lupus erythematosus, unspecified: Secondary | ICD-10-CM | POA: Insufficient documentation

## 2019-06-13 DIAGNOSIS — K449 Diaphragmatic hernia without obstruction or gangrene: Secondary | ICD-10-CM | POA: Insufficient documentation

## 2019-06-13 DIAGNOSIS — J45909 Unspecified asthma, uncomplicated: Secondary | ICD-10-CM | POA: Insufficient documentation

## 2019-06-13 DIAGNOSIS — Z8679 Personal history of other diseases of the circulatory system: Secondary | ICD-10-CM | POA: Insufficient documentation

## 2019-06-27 ENCOUNTER — Other Ambulatory Visit: Payer: Self-pay

## 2019-06-27 DIAGNOSIS — Z20822 Contact with and (suspected) exposure to covid-19: Secondary | ICD-10-CM

## 2019-06-29 LAB — NOVEL CORONAVIRUS, NAA: SARS-CoV-2, NAA: NOT DETECTED

## 2019-08-28 ENCOUNTER — Other Ambulatory Visit: Payer: Self-pay

## 2019-08-28 DIAGNOSIS — Z20822 Contact with and (suspected) exposure to covid-19: Secondary | ICD-10-CM

## 2019-08-29 LAB — NOVEL CORONAVIRUS, NAA: SARS-CoV-2, NAA: NOT DETECTED

## 2019-09-23 ENCOUNTER — Other Ambulatory Visit: Payer: Self-pay

## 2019-09-23 DIAGNOSIS — Z20822 Contact with and (suspected) exposure to covid-19: Secondary | ICD-10-CM

## 2019-09-24 LAB — NOVEL CORONAVIRUS, NAA: SARS-CoV-2, NAA: NOT DETECTED

## 2020-02-18 ENCOUNTER — Emergency Department: Payer: No Typology Code available for payment source

## 2020-02-18 ENCOUNTER — Emergency Department
Admission: EM | Admit: 2020-02-18 | Discharge: 2020-02-18 | Disposition: A | Discharge status: Home / Self Care | Payer: No Typology Code available for payment source | Attending: Student in an Organized Health Care Education/Training Program | Admitting: Student in an Organized Health Care Education/Training Program

## 2020-02-18 ENCOUNTER — Encounter: Payer: Self-pay | Admitting: Emergency Medicine

## 2020-02-18 DIAGNOSIS — S7002XA Contusion of left hip, initial encounter: Secondary | ICD-10-CM | POA: Diagnosis not present

## 2020-02-18 DIAGNOSIS — Y929 Unspecified place or not applicable: Secondary | ICD-10-CM | POA: Insufficient documentation

## 2020-02-18 DIAGNOSIS — Y998 Other external cause status: Secondary | ICD-10-CM | POA: Insufficient documentation

## 2020-02-18 DIAGNOSIS — J45909 Unspecified asthma, uncomplicated: Secondary | ICD-10-CM | POA: Insufficient documentation

## 2020-02-18 DIAGNOSIS — Y9389 Activity, other specified: Secondary | ICD-10-CM | POA: Insufficient documentation

## 2020-02-18 DIAGNOSIS — S8002XA Contusion of left knee, initial encounter: Secondary | ICD-10-CM

## 2020-02-18 DIAGNOSIS — M7062 Trochanteric bursitis, left hip: Secondary | ICD-10-CM

## 2020-02-18 DIAGNOSIS — S8992XA Unspecified injury of left lower leg, initial encounter: Secondary | ICD-10-CM | POA: Diagnosis present

## 2020-02-18 DIAGNOSIS — X58XXXA Exposure to other specified factors, initial encounter: Secondary | ICD-10-CM | POA: Insufficient documentation

## 2020-02-18 DIAGNOSIS — M25562 Pain in left knee: Secondary | ICD-10-CM

## 2020-02-18 LAB — POCT PREGNANCY, URINE: Preg Test, Ur: NEGATIVE

## 2020-02-18 MED ORDER — HYDROCODONE-ACETAMINOPHEN 5-325 MG PO TABS
1.0000 | ORAL_TABLET | Freq: Once | ORAL | Status: AC
  Administered 2020-02-18: 1 via ORAL
  Filled 2020-02-18: qty 1

## 2020-02-18 MED ORDER — TRAMADOL HCL 50 MG PO TABS: 50.0000 mg | ORAL_TABLET | Freq: Four times a day (QID) | ORAL | 0 refills | Status: DC | PRN

## 2020-02-18 MED ORDER — MELOXICAM 15 MG PO TABS: 15.0000 mg | ORAL_TABLET | Freq: Every day | ORAL | 0 refills | Status: DC

## 2020-02-18 MED ORDER — KETOROLAC TROMETHAMINE 30 MG/ML IJ SOLN
30.0000 mg | Freq: Once | INTRAMUSCULAR | Status: AC
  Administered 2020-02-18: 23:00:00 30 mg via INTRAMUSCULAR
  Filled 2020-02-18: qty 1

## 2020-02-18 NOTE — ED Notes (Signed)
Pt ambulatory to room with a limp; states she fell while walking the dog.  Pt reports pain is 6/10; in no acute distress at this time.

## 2020-02-18 NOTE — ED Provider Notes (Signed)
[facility] Emergency Department Provider Note  ____________________________________________  Time seen: Approximately 9:18 PM  I have reviewed the triage vital signs and the nursing notes.   HISTORY  Chief Complaint Fall    HPI Monica Gonzalez is a 44 y.o. female who presents to the emergency department with complaint of left knee and left hip pain.  Patient states for the past several days she has been having problems with her knee and her hip.  She is a Airline pilot, on her feet a lot was having pain behind her patella extending into the lateral and medial aspect of the knee.  Patient has also began to experience some lateral hip pain.  She was unsure whether she had twisted her knee or was suffering from lupus.  Tonight, she was going up the stairs, her dog ran in front of her tripping her causing her to fall landing on her knee and the left side of her hip exacerbating her pain.  Patient presents the emergency department for evaluation to ensure line, no trauma as well as to, no chronic issue that needs to be dealt with.         Past Medical History:  Diagnosis Date  . Asthma   . Lupus (HCC)     There are no problems to display for this patient.   Past Surgical History:  Procedure Laterality Date  . TONSILLECTOMY    . TUBAL LIGATION      Prior to Admission medications   Medication Sig Start Date End Date Taking? Authorizing Provider  albuterol (PROVENTIL HFA) 108 (90 Base) MCG/ACT inhaler Inhale 2 puffs into the lungs every 4 (four) hours as needed for wheezing or shortness of breath. 03/10/19   Sharman Cheek, MD  alum & mag hydroxide-simeth (MAALOX MAX) 400-400-40 MG/5ML suspension Take 5 mLs by mouth every 6 (six) hours as needed for indigestion. 12/24/18   Nita Sickle, MD  amoxicillin (AMOXIL) 500 MG capsule Take 1 capsule (500 mg total) by mouth 3 (three) times daily. 04/28/17   Enid Derry, PA-C  benzonatate (TESSALON PERLES) 100 MG  capsule Take 1 capsule (100 mg total) by mouth 3 (three) times daily as needed. 05/20/19 05/19/20  Enid Derry, PA-C  famotidine (PEPCID) 40 MG tablet Take 1 tablet (40 mg total) by mouth daily. 12/24/18 12/24/19  Nita Sickle, MD  ibuprofen (ADVIL,MOTRIN) 800 MG tablet Take 1 tablet (800 mg total) by mouth every 8 (eight) hours as needed. 04/28/17   Enid Derry, PA-C  lidocaine (XYLOCAINE) 2 % solution Use as directed 10 mLs in the mouth or throat as needed for mouth pain. 04/28/17   Enid Derry, PA-C  meloxicam (MOBIC) 15 MG tablet Take 1 tablet (15 mg total) by mouth daily. 02/18/20   Tarl Cephas, Delorise Royals, PA-C  traMADol (ULTRAM) 50 MG tablet Take 1 tablet (50 mg total) by mouth every 6 (six) hours as needed. 02/18/20   Lakita Sahlin, Delorise Royals, PA-C    Allergies Prednisone  Family History  Problem Relation Age of Onset  . Diabetes Mother   . Congestive Heart Failure Mother     Social History Social History   Tobacco Use  . Smoking status: Never Smoker  . Smokeless tobacco: Never Used  Substance Use Topics  . Alcohol use: Yes  . Drug use: No     Review of Systems  Constitutional: No fever/chills Eyes: No visual changes. No discharge ENT: No upper respiratory complaints. Cardiovascular: no chest pain. Respiratory: no cough. No SOB. Gastrointestinal: No  abdominal pain.  No nausea, no vomiting. Musculoskeletal: Positive for left knee and hip pain Skin: Negative for rash, abrasions, lacerations, ecchymosis. Neurological: Negative for headaches, focal weakness or numbness. 10-point ROS otherwise negative.  ____________________________________________   PHYSICAL EXAM:  VITAL SIGNS: ED Triage Vitals [02/18/20 2043]  Enc Vitals Group     BP (!) 117/59     Pulse Rate 76     Resp 17     Temp 98.7 F (37.1 C)     Temp Source Oral     SpO2 99 %     Weight      Height      Head Circumference      Peak Flow      Pain Score      Pain Loc      Pain Edu?      Excl. in  [place]?      Constitutional: Alert and oriented. Well appearing and in no acute distress. Eyes: Conjunctivae are normal. PERRL. EOMI. Head: Atraumatic  Neck: No stridor.    Cardiovascular: Normal rate, regular rhythm. Normal S1 and S2.  Good peripheral circulation. Respiratory: Normal respiratory effort without tachypnea or retractions. Lungs CTAB. Good air entry to the bases with no decreased or absent breath sounds. Musculoskeletal: Full range of motion to all extremities. No gross deformities appreciated.  Visualization of the left knee and left hip reveals no visible signs of trauma.  No gross edema.  No warmth to palpation.  No ballottement on physical exam.  Patient is tender to palpation of the patella extending in the bilateral joint lines.  No palpable abnormality or deficit.  Examination of the hip reveals no deformity.  No shortening or rotation of the left lower extremity.  Patient is tender to palpation over the trochanter region.  No palpable abnormality.  Examination of the lumbar spine is unremarkable.  Dorsalis pedis pulses sensation intact distally. Neurologic:  Normal speech and language. No gross focal neurologic deficits are appreciated.  Skin:  Skin is warm, dry and intact. No rash noted. Psychiatric: Mood and affect are normal. Speech and behavior are normal. Patient exhibits appropriate insight and judgement.   ____________________________________________   LABS (all labs ordered are listed, but only abnormal results are displayed)  Labs Reviewed  POCT PREGNANCY, URINE   ____________________________________________  EKG   ____________________________________________  RADIOLOGY I personally viewed and evaluated these images as part of my medical decision making, as well as reviewing the written report by the radiologist.  DG Knee Complete 4 Views Left  Result Date: 02/18/2020 CLINICAL DATA:  Tripped while walking dog today leading to fall. Left hip and knee pain.  EXAM: LEFT KNEE - COMPLETE 4+ VIEW COMPARISON:  None. FINDINGS: No evidence of fracture, dislocation, or joint effusion. No evidence of arthropathy or other focal bone abnormality. Soft tissues are unremarkable. IMPRESSION: Negative radiographs of the left knee. Electronically Signed   By: Keith Rake M.D.   On: 02/18/2020 21:41   DG Hip Unilat W or Wo Pelvis 2-3 Views Left  Result Date: 02/18/2020 CLINICAL DATA:  Tripped while walking dog today leading to fall. Left hip and knee pain. EXAM: DG HIP (WITH OR WITHOUT PELVIS) 2-3V LEFT COMPARISON:  None. FINDINGS: The cortical margins of the bony pelvis and left hip are intact. No fracture. Pubic symphysis and sacroiliac joints are congruent. Pubic rami are intact. Both femoral heads are well-seated in the respective acetabula. IMPRESSION: Negative radiographs of the pelvis and left hip. Electronically Signed  By: Narda Rutherford M.D.   On: 02/18/2020 21:40    ____________________________________________    PROCEDURES  Procedure(s) performed:    Procedures    Medications  ketorolac (TORADOL) 30 MG/ML injection 30 mg (has no administration in time range)  HYDROcodone-acetaminophen (NORCO/VICODIN) 5-325 MG per tablet 1 tablet (has no administration in time range)     ____________________________________________   INITIAL IMPRESSION / ASSESSMENT AND PLAN / ED COURSE  Pertinent labs & imaging results that were available during my care of the patient were reviewed by me and considered in my medical decision making (see chart for details).  Review of the [place] CSRS was performed in accordance of the [facility] prior to dispensing any controlled drugs.           Patient's diagnosis is consistent with knee and hip contusion.  Patient presented to emergency department complaining of increased knee and hip pain after a fall tonight.  Patient has been having difficulty with pain behind her kneecap, left lateral hip.  Patient story appeared  consistent with patellofemoral syndrome with bursitis of the left hip.  Patient did sustain a fall landing on this knee and hip.  Imaging was reassuring.  I will place the patient on meloxicam, Ultram for symptom relief.  Patient was given Norco and Toradol here in the emergency department.  Follow-up with orthopedics as needed..  Patient is given ED precautions to return to the ED for any worsening or new symptoms.     ____________________________________________  FINAL CLINICAL IMPRESSION(S) / ED DIAGNOSES  Final diagnoses:  Contusion of left knee, initial encounter  Contusion of left hip, initial encounter  Pain of left patellofemoral joint  Trochanteric bursitis of left hip      NEW MEDICATIONS STARTED DURING THIS VISIT:  ED Discharge Orders         Ordered    meloxicam (MOBIC) 15 MG tablet  Daily     02/18/20 2224    traMADol (ULTRAM) 50 MG tablet  Every 6 hours PRN     02/18/20 2224              This chart was dictated using voice recognition software/Dragon. Despite best efforts to proofread, errors can occur which can change the meaning. Any change was purely unintentional.    Racheal Patches, PA-C 02/18/20 2224    Willy Eddy, MD 02/18/20 2233

## 2020-02-18 NOTE — ED Triage Notes (Signed)
Pt reports she was tripped while walking dog today and is now having left knee and hip pain. Pt able to ambulate but not without pain.

## 2020-05-18 ENCOUNTER — Other Ambulatory Visit: Payer: Self-pay

## 2020-05-18 ENCOUNTER — Emergency Department: Payer: No Typology Code available for payment source

## 2020-05-18 DIAGNOSIS — R110 Nausea: Secondary | ICD-10-CM | POA: Insufficient documentation

## 2020-05-18 DIAGNOSIS — J45909 Unspecified asthma, uncomplicated: Secondary | ICD-10-CM | POA: Insufficient documentation

## 2020-05-18 DIAGNOSIS — Z79899 Other long term (current) drug therapy: Secondary | ICD-10-CM | POA: Insufficient documentation

## 2020-05-18 DIAGNOSIS — R002 Palpitations: Secondary | ICD-10-CM | POA: Insufficient documentation

## 2020-05-18 DIAGNOSIS — R197 Diarrhea, unspecified: Secondary | ICD-10-CM | POA: Insufficient documentation

## 2020-05-18 LAB — BASIC METABOLIC PANEL
Anion gap: 10 (ref 5–15)
BUN: 10 mg/dL (ref 6–20)
CO2: 24 mmol/L (ref 22–32)
Calcium: 9.1 mg/dL (ref 8.9–10.3)
Chloride: 107 mmol/L (ref 98–111)
Creatinine, Ser: 0.98 mg/dL (ref 0.44–1.00)
GFR calc Af Amer: >60 mL/min (ref >60)
GFR calc non Af Amer: >60 mL/min (ref >60)
Glucose, Bld: 96 mg/dL (ref 70–99)
Potassium: 3.3 mmol/L — ABNORMAL LOW (ref 3.5–5.1)
Sodium: 141 mmol/L (ref 135–145)

## 2020-05-18 LAB — POCT PREGNANCY, URINE: Preg Test, Ur: NEGATIVE

## 2020-05-18 LAB — CBC
HCT: 42.3 % (ref 36.0–46.0)
Hemoglobin: 12.1 g/dL (ref 12.0–15.0)
MCH: 21.8 pg — ABNORMAL LOW (ref 26.0–34.0)
MCHC: 28.6 g/dL — ABNORMAL LOW (ref 30.0–36.0)
MCV: 76.4 fL — ABNORMAL LOW (ref 80.0–100.0)
Platelets: 514 10*3/uL — ABNORMAL HIGH (ref 150–400)
RBC: 5.54 MIL/uL — ABNORMAL HIGH (ref 3.87–5.11)
RDW: 15.3 % (ref 11.5–15.5)
WBC: 9.4 10*3/uL (ref 4.0–10.5)
nRBC: 0 % (ref 0.0–0.2)

## 2020-05-18 LAB — TROPONIN I (HIGH SENSITIVITY)
Troponin I (High Sensitivity): 10 ng/L (ref <18)
Troponin I (High Sensitivity): 7 ng/L (ref <18)

## 2020-05-18 NOTE — ED Triage Notes (Signed)
Pt arrives to ED via POV from home with c/o tachycardia, palpitations, and nausea since this morning. Pt reports laying down "all day" to fell better, without effect. Pt denies pain, but reports sensation of "heart beating really fast" and "was clammy this morning when I got up". No emesis; no SHOB. No recent fever or illnesses. Pt is &AO, in NAD; RR even,. Regular, and unlabored.

## 2020-05-19 ENCOUNTER — Emergency Department
Admission: EM | Admit: 2020-05-19 | Discharge: 2020-05-19 | Disposition: A | Discharge status: Home / Self Care | Payer: No Typology Code available for payment source | Attending: Student in an Organized Health Care Education/Training Program | Admitting: Student in an Organized Health Care Education/Training Program

## 2020-05-19 DIAGNOSIS — R197 Diarrhea, unspecified: Secondary | ICD-10-CM

## 2020-05-19 DIAGNOSIS — R002 Palpitations: Secondary | ICD-10-CM

## 2020-05-19 DIAGNOSIS — R110 Nausea: Secondary | ICD-10-CM

## 2020-05-19 LAB — HEPATIC FUNCTION PANEL
ALT: 11 U/L (ref 0–44)
AST: 19 U/L (ref 15–41)
Albumin: 3.9 g/dL (ref 3.5–5.0)
Alkaline Phosphatase: 47 U/L (ref 38–126)
Bilirubin, Direct: 0.2 mg/dL (ref 0.0–0.2)
Indirect Bilirubin: 0.6 mg/dL (ref 0.3–0.9)
Total Bilirubin: 0.8 mg/dL (ref 0.3–1.2)
Total Protein: 7.3 g/dL (ref 6.5–8.1)

## 2020-05-19 LAB — TSH: TSH: 1.091 u[IU]/mL (ref 0.350–4.500)

## 2020-05-19 LAB — LIPASE, BLOOD: Lipase: 27 U/L (ref 11–51)

## 2020-05-19 MED ORDER — ONDANSETRON 4 MG PO TBDP
4.0000 mg | ORAL_TABLET | Freq: Once | ORAL | Status: AC
  Administered 2020-05-19: 4 mg via ORAL
  Filled 2020-05-19: qty 1

## 2020-05-19 MED ORDER — ONDANSETRON 4 MG PO TBDP
4.0000 mg | ORAL_TABLET | Freq: Three times a day (TID) | ORAL | 0 refills | Status: DC | PRN
Start: 2020-05-19 — End: 2020-07-23

## 2020-05-19 NOTE — ED Provider Notes (Signed)
[facility] Emergency Department Provider Note    First MD Initiated Contact with Patient 05/19/20 0021     (approximate)  I have reviewed the triage vital signs and the nursing notes.   HISTORY  Chief Complaint Palpitations and Nausea    HPI Monica Gonzalez is a 44 y.o. female close past medical history presents to the ER for feeling heart palpitations nausea multiple episodes of diarrhea for the past 24 hours.  States diarrhea is brown and soft liquid.  No blood no melena.  Denies any measured fevers.  Denies any chest pain.  No abdominal pain.  Is having some nausea still has not take anything for that.  States that she gets these palpitations when she starts feeling dehydrated.  Denies any recent sick contacts.  Was on antibiotics several weeks ago for a dental infection which got better.    Past Medical History:  Diagnosis Date  . Asthma   . Lupus (HCC)    Family History  Problem Relation Age of Onset  . Diabetes Mother   . Congestive Heart Failure Mother    Past Surgical History:  Procedure Laterality Date  . TONSILLECTOMY    . TUBAL LIGATION     There are no problems to display for this patient.     Prior to Admission medications   Medication Sig Start Date End Date Taking? Authorizing Provider  albuterol (PROVENTIL HFA) 108 (90 Base) MCG/ACT inhaler Inhale 2 puffs into the lungs every 4 (four) hours as needed for wheezing or shortness of breath. 03/10/19   Sharman Cheek, MD  alum & mag hydroxide-simeth (MAALOX MAX) 400-400-40 MG/5ML suspension Take 5 mLs by mouth every 6 (six) hours as needed for indigestion. 12/24/18   Nita Sickle, MD  amoxicillin (AMOXIL) 500 MG capsule Take 1 capsule (500 mg total) by mouth 3 (three) times daily. 04/28/17   Enid Derry, PA-C  benzonatate (TESSALON PERLES) 100 MG capsule Take 1 capsule (100 mg total) by mouth 3 (three) times daily as needed. 05/20/19 05/19/20  Enid Derry, PA-C  famotidine  (PEPCID) 40 MG tablet Take 1 tablet (40 mg total) by mouth daily. 12/24/18 12/24/19  Nita Sickle, MD  ibuprofen (ADVIL,MOTRIN) 800 MG tablet Take 1 tablet (800 mg total) by mouth every 8 (eight) hours as needed. 04/28/17   Enid Derry, PA-C  lidocaine (XYLOCAINE) 2 % solution Use as directed 10 mLs in the mouth or throat as needed for mouth pain. 04/28/17   Enid Derry, PA-C  meloxicam (MOBIC) 15 MG tablet Take 1 tablet (15 mg total) by mouth daily. 02/18/20   Cuthriell, Delorise Royals, PA-C  ondansetron (ZOFRAN ODT) 4 MG disintegrating tablet Take 1 tablet (4 mg total) by mouth every 8 (eight) hours as needed for nausea or vomiting. 05/19/20   Willy Eddy, MD  traMADol (ULTRAM) 50 MG tablet Take 1 tablet (50 mg total) by mouth every 6 (six) hours as needed. 02/18/20   Cuthriell, Delorise Royals, PA-C    Allergies Prednisone    Social History Social History   Tobacco Use  . Smoking status: Never Smoker  . Smokeless tobacco: Never Used  Substance Use Topics  . Alcohol use: Yes  . Drug use: No    Review of Systems Patient denies headaches, rhinorrhea, blurry vision, numbness, shortness of breath, chest pain, edema, cough, abdominal pain, nausea, vomiting, diarrhea, dysuria, fevers, rashes or hallucinations unless otherwise stated above in HPI. ____________________________________________   PHYSICAL EXAM:  VITAL SIGNS: Vitals:   05/19/20  0030 05/19/20 0056  BP: 114/69   Pulse: 78 (!) 56  Resp: 18 13  Temp:    SpO2: 99% 100%    Constitutional: Alert and oriented.  Eyes: Conjunctivae are normal.  Head: Atraumatic. Nose: No congestion/rhinnorhea. Mouth/Throat: Mucous membranes are moist.   Neck: No stridor. Painless ROM.  Cardiovascular: Normal rate, regular rhythm. Grossly normal heart sounds.  Good peripheral circulation. Respiratory: Normal respiratory effort.  No retractions. Lungs CTAB. Gastrointestinal: Soft and nontender. No distention. No abdominal bruits. No CVA  tenderness. Genitourinary:  Musculoskeletal: No lower extremity tenderness nor edema.  No joint effusions. Neurologic:  Normal speech and language. No gross focal neurologic deficits are appreciated. No facial droop Skin:  Skin is warm, dry and intact. No rash noted. Psychiatric: Mood and affect are normal. Speech and behavior are normal.  ____________________________________________   LABS (all labs ordered are listed, but only abnormal results are displayed)  Results for orders placed or performed during the hospital encounter of 05/19/20 (from the past 24 hour(s))  Basic metabolic panel     Status: Abnormal   Collection Time: 05/18/20  7:17 PM  Result Value Ref Range   Sodium 141 135 - 145 mmol/L   Potassium 3.3 (L) 3.5 - 5.1 mmol/L   Chloride 107 98 - 111 mmol/L   CO2 24 22 - 32 mmol/L   Glucose, Bld 96 70 - 99 mg/dL   BUN 10 6 - 20 mg/dL   Creatinine, Ser 4.090.98 0.44 - 1.00 mg/dL   Calcium 9.1 8.9 - 81.110.3 mg/dL   GFR calc non Af Amer >60 >60 mL/min   GFR calc Af Amer >60 >60 mL/min   Anion gap 10 5 - 15  CBC     Status: Abnormal   Collection Time: 05/18/20  7:17 PM  Result Value Ref Range   WBC 9.4 4.0 - 10.5 K/uL   RBC 5.54 (H) 3.87 - 5.11 MIL/uL   Hemoglobin 12.1 12.0 - 15.0 g/dL   HCT 91.442.3 36 - 46 %   MCV 76.4 (L) 80.0 - 100.0 fL   MCH 21.8 (L) 26.0 - 34.0 pg   MCHC 28.6 (L) 30.0 - 36.0 g/dL   RDW 78.215.3 95.611.5 - 21.315.5 %   Platelets 514 (H) 150 - 400 K/uL   nRBC 0.0 0.0 - 0.2 %  Troponin I (High Sensitivity)     Status: None   Collection Time: 05/18/20  7:17 PM  Result Value Ref Range   Troponin I (High Sensitivity) 10 <18 ng/L  TSH     Status: None   Collection Time: 05/18/20  7:17 PM  Result Value Ref Range   TSH 1.091 0.350 - 4.500 uIU/mL  Pregnancy, urine POC     Status: None   Collection Time: 05/18/20  7:27 PM  Result Value Ref Range   Preg Test, Ur NEGATIVE NEGATIVE  Troponin I (High Sensitivity)     Status: None   Collection Time: 05/18/20  9:15 PM    Result Value Ref Range   Troponin I (High Sensitivity) 7 <18 ng/L  Hepatic function panel     Status: None   Collection Time: 05/18/20  9:15 PM  Result Value Ref Range   Total Protein 7.3 6.5 - 8.1 g/dL   Albumin 3.9 3.5 - 5.0 g/dL   AST 19 15 - 41 U/L   ALT 11 0 - 44 U/L   Alkaline Phosphatase 47 38 - 126 U/L   Total Bilirubin 0.8 0.3 - 1.2 mg/dL  Bilirubin, Direct 0.2 0.0 - 0.2 mg/dL   Indirect Bilirubin 0.6 0.3 - 0.9 mg/dL  Lipase, blood     Status: None   Collection Time: 05/18/20  9:15 PM  Result Value Ref Range   Lipase 27 11 - 51 U/L   ____________________________________________  EKG My review and personal interpretation at Time: 19:05   Indication: palpitations  Rate: 75  Rhythm: sinus Axis: normal Other: normal intervals, no stemi, no wpw or brugada ____________________________________________  RADIOLOGY  I personally reviewed all radiographic images ordered to evaluate for the above acute complaints and reviewed radiology reports and findings.  These findings were personally discussed with the patient.  Please see medical record for radiology report.  ____________________________________________   PROCEDURES  Procedure(s) performed:  Procedures    Critical Care performed: no ____________________________________________   INITIAL IMPRESSION / ASSESSMENT AND PLAN / ED COURSE  Pertinent labs & imaging results that were available during my care of the patient were reviewed by me and considered in my medical decision making (see chart for details).   DDX: Enteritis, gastritis, ACS, dysrhythmia, pancreatitis, cholelithiasis, dehydration, electrolyte abnormality  Bren Borys Dennard is a 44 y.o. who presents to the ED with symptoms as described above.  Patient very well-appearing and nontoxic.  Her EKG is nonischemic and no signs of preexcitation syndrome.  Her troponins are also nonischemic.  This does not seem consistent with ACS or acute cardiac pathology.   Discussed need for follow-up for Holter monitor to evaluate for palpitations.  Blood work is reassuring but will add on LFTs as well as lipase and TSH.  Stated that would check stool for infectious studies given her recent antibiotics if she is able to provide stool sample but she feels like the symptoms are waning which is reassuring.  Will give Zofran and p.o. challenge.  Clinical Course as of May 19 120  Tue May 19, 2020  0117 Patient reassessed.  Blood work is reassuring.  She feels significantly improved after Zofran.  She is tolerating p.o.  Not having any worsening or persistent diarrhea therefore I will lower suspicion for C. difficile or infectious colitis.  Discussed signs symptoms which he should return to the ER and discussed outpatient follow-up for possible Holter monitor given her palpitations.  Have discussed with the patient and available family all diagnostics and treatments performed thus far and all questions were answered to the best of my ability. The patient demonstrates understanding and agreement with plan.    [PR]    Clinical Course User Index [PR] Willy Eddy, MD    The patient was evaluated in Emergency Department today for the symptoms described in the history of present illness. He/she was evaluated in the context of the global COVID-19 pandemic, which necessitated consideration that the patient might be at risk for infection with the SARS-CoV-2 virus that causes COVID-19. Institutional protocols and algorithms that pertain to the evaluation of patients at risk for COVID-19 are in a state of rapid change based on information released by regulatory bodies including the CDC and federal and state organizations. These policies and algorithms were followed during the patient's care in the ED.  As part of my medical decision making, I reviewed the following data within the electronic MEDICAL RECORD NUMBER Nursing notes reviewed and incorporated, Labs reviewed, notes from prior  ED visits and [place] Controlled Substance Database   ____________________________________________   FINAL CLINICAL IMPRESSION(S) / ED DIAGNOSES  Final diagnoses:  Nausea  Diarrhea, unspecified type  Palpitations  NEW MEDICATIONS STARTED DURING THIS VISIT:  New Prescriptions   ONDANSETRON (ZOFRAN ODT) 4 MG DISINTEGRATING TABLET    Take 1 tablet (4 mg total) by mouth every 8 (eight) hours as needed for nausea or vomiting.     Note:  This document was prepared using Dragon voice recognition software and may include unintentional dictation errors.    Willy Eddy, MD 05/19/20 [phone]

## 2020-06-16 ENCOUNTER — Emergency Department: Payer: No Typology Code available for payment source

## 2020-06-16 ENCOUNTER — Other Ambulatory Visit: Payer: Self-pay

## 2020-06-16 ENCOUNTER — Encounter: Payer: Self-pay | Admitting: Emergency Medicine

## 2020-06-16 ENCOUNTER — Emergency Department
Admission: EM | Admit: 2020-06-16 | Discharge: 2020-06-16 | Disposition: A | Discharge status: Home / Self Care | Payer: No Typology Code available for payment source | Attending: Emergency Medicine | Admitting: Emergency Medicine

## 2020-06-16 DIAGNOSIS — Z79899 Other long term (current) drug therapy: Secondary | ICD-10-CM | POA: Diagnosis not present

## 2020-06-16 DIAGNOSIS — E876 Hypokalemia: Secondary | ICD-10-CM | POA: Diagnosis not present

## 2020-06-16 DIAGNOSIS — M79662 Pain in left lower leg: Secondary | ICD-10-CM | POA: Insufficient documentation

## 2020-06-16 DIAGNOSIS — N390 Urinary tract infection, site not specified: Secondary | ICD-10-CM | POA: Diagnosis not present

## 2020-06-16 DIAGNOSIS — D649 Anemia, unspecified: Secondary | ICD-10-CM

## 2020-06-16 DIAGNOSIS — J45909 Unspecified asthma, uncomplicated: Secondary | ICD-10-CM | POA: Insufficient documentation

## 2020-06-16 DIAGNOSIS — M79661 Pain in right lower leg: Secondary | ICD-10-CM | POA: Diagnosis not present

## 2020-06-16 DIAGNOSIS — M545 Low back pain, unspecified: Secondary | ICD-10-CM

## 2020-06-16 LAB — URINALYSIS, COMPLETE (UACMP) WITH MICROSCOPIC
Bilirubin Urine: NEGATIVE
Glucose, UA: NEGATIVE mg/dL
Hgb urine dipstick: NEGATIVE
Ketones, ur: NEGATIVE mg/dL
Nitrite: NEGATIVE
Protein, ur: NEGATIVE mg/dL
Specific Gravity, Urine: 1.013 (ref 1.005–1.030)
pH: 6 (ref 5.0–8.0)

## 2020-06-16 LAB — CBC
HCT: 35 % — ABNORMAL LOW (ref 36.0–46.0)
Hemoglobin: 10.1 g/dL — ABNORMAL LOW (ref 12.0–15.0)
MCH: 22.3 pg — ABNORMAL LOW (ref 26.0–34.0)
MCHC: 28.9 g/dL — ABNORMAL LOW (ref 30.0–36.0)
MCV: 77.3 fL — ABNORMAL LOW (ref 80.0–100.0)
Platelets: 387 10*3/uL (ref 150–400)
RBC: 4.53 MIL/uL (ref 3.87–5.11)
RDW: 16 % — ABNORMAL HIGH (ref 11.5–15.5)
WBC: 9.4 10*3/uL (ref 4.0–10.5)
nRBC: 0 % (ref 0.0–0.2)

## 2020-06-16 LAB — BASIC METABOLIC PANEL
Anion gap: 10 (ref 5–15)
BUN: 9 mg/dL (ref 6–20)
CO2: 23 mmol/L (ref 22–32)
Calcium: 8.6 mg/dL — ABNORMAL LOW (ref 8.9–10.3)
Chloride: 106 mmol/L (ref 98–111)
Creatinine, Ser: 0.88 mg/dL (ref 0.44–1.00)
GFR calc Af Amer: >60 mL/min (ref >60)
GFR calc non Af Amer: >60 mL/min (ref >60)
Glucose, Bld: 91 mg/dL (ref 70–99)
Potassium: 3.4 mmol/L — ABNORMAL LOW (ref 3.5–5.1)
Sodium: 139 mmol/L (ref 135–145)

## 2020-06-16 LAB — PREGNANCY, URINE: Preg Test, Ur: NEGATIVE

## 2020-06-16 MED ORDER — CEPHALEXIN 500 MG PO CAPS
500.0000 mg | ORAL_CAPSULE | Freq: Once | ORAL | Status: AC
  Administered 2020-06-16: 500 mg via ORAL
  Filled 2020-06-16: qty 1

## 2020-06-16 MED ORDER — POTASSIUM CHLORIDE CRYS ER 20 MEQ PO TBCR
40.0000 meq | EXTENDED_RELEASE_TABLET | Freq: Once | ORAL | Status: AC
  Administered 2020-06-16: 40 meq via ORAL
  Filled 2020-06-16: qty 2

## 2020-06-16 MED ORDER — CEPHALEXIN 500 MG PO CAPS: 500.0000 mg | ORAL_CAPSULE | Freq: Three times a day (TID) | ORAL | 0 refills | Status: AC

## 2020-06-16 MED ORDER — IBUPROFEN 600 MG PO TABS: 600.0000 mg | ORAL_TABLET | Freq: Four times a day (QID) | ORAL | 0 refills | Status: DC | PRN

## 2020-06-16 MED ORDER — BACLOFEN 5 MG PO TABS
5.0000 mg | ORAL_TABLET | Freq: Three times a day (TID) | ORAL | 0 refills | Status: DC | PRN
Start: 2020-06-16 — End: 2021-03-21

## 2020-06-16 NOTE — ED Provider Notes (Signed)
[facility] Emergency Department Provider Note  ____________________________________________  Time seen: Approximately 8:48 PM  I have reviewed the triage vital signs and the nursing notes.   HISTORY  Chief Complaint Back Pain and Leg Pain    HPI Monica Gonzalez is a 44 y.o. female that presents to the emergency department for evaluation of low back pain and bilateral calf pain for 2 days.  She can feel a "knot "to the left side of her low back.  Pain to her calves is worse with walking.  Occasionally, her feet have felt numb. Patient did lift up a heavy patient at work 3 days ago and thought this may have aggravated her pain.  No specific trauma.  No bowel or bladder dysfunction or saddle anesthesias.  No fever, nausea, vomiting, abdominal pain, urinary symptoms, hematuria.  Past Medical History:  Diagnosis Date  . Asthma   . Lupus (HCC)     There are no problems to display for this patient.   Past Surgical History:  Procedure Laterality Date  . TONSILLECTOMY    . TUBAL LIGATION      Prior to Admission medications   Medication Sig Start Date End Date Taking? Authorizing Provider  albuterol (PROVENTIL HFA) 108 (90 Base) MCG/ACT inhaler Inhale 2 puffs into the lungs every 4 (four) hours as needed for wheezing or shortness of breath. 03/10/19   Sharman Cheek, MD  alum & mag hydroxide-simeth (MAALOX MAX) 400-400-40 MG/5ML suspension Take 5 mLs by mouth every 6 (six) hours as needed for indigestion. 12/24/18   Nita Sickle, MD  amoxicillin (AMOXIL) 500 MG capsule Take 1 capsule (500 mg total) by mouth 3 (three) times daily. 04/28/17   Enid Derry, PA-C  Baclofen 5 MG TABS Take 5 mg by mouth 3 (three) times daily as needed. 06/16/20   Enid Derry, PA-C  cephALEXin (KEFLEX) 500 MG capsule Take 1 capsule (500 mg total) by mouth 3 (three) times daily for 10 days. 06/16/20 06/26/20  Enid Derry, PA-C  famotidine (PEPCID) 40 MG tablet Take 1 tablet (40 mg  total) by mouth daily. 12/24/18 12/24/19  Nita Sickle, MD  ibuprofen (ADVIL) 600 MG tablet Take 1 tablet (600 mg total) by mouth every 6 (six) hours as needed. 06/16/20   Enid Derry, PA-C  lidocaine (XYLOCAINE) 2 % solution Use as directed 10 mLs in the mouth or throat as needed for mouth pain. 04/28/17   Enid Derry, PA-C  meloxicam (MOBIC) 15 MG tablet Take 1 tablet (15 mg total) by mouth daily. 02/18/20   Cuthriell, Delorise Royals, PA-C  ondansetron (ZOFRAN ODT) 4 MG disintegrating tablet Take 1 tablet (4 mg total) by mouth every 8 (eight) hours as needed for nausea or vomiting. 05/19/20   Willy Eddy, MD  traMADol (ULTRAM) 50 MG tablet Take 1 tablet (50 mg total) by mouth every 6 (six) hours as needed. 02/18/20   Cuthriell, Delorise Royals, PA-C    Allergies Prednisone  Family History  Problem Relation Age of Onset  . Diabetes Mother   . Congestive Heart Failure Mother     Social History Social History   Tobacco Use  . Smoking status: Never Smoker  . Smokeless tobacco: Never Used  Substance Use Topics  . Alcohol use: Yes  . Drug use: No     Review of Systems  Constitutional: No fever/chills Gastrointestinal: No abdominal pain.  No nausea, no vomiting.  Genitourinary: No urinary symptoms. Musculoskeletal: Positive for low back pain and calf pain. Skin: Negative for  rash, abrasions, lacerations, ecchymosis. Neurological: Negative for headaches   ____________________________________________   PHYSICAL EXAM:  VITAL SIGNS: ED Triage Vitals  Enc Vitals Group     BP 06/16/20 1743 116/65     Pulse Rate 06/16/20 1743 64     Resp 06/16/20 1743 18     Temp 06/16/20 1743 99.4 F (37.4 C)     Temp Source 06/16/20 1743 Oral     SpO2 06/16/20 1743 100 %     Weight 06/16/20 1744 180 lb (81.6 kg)     Height 06/16/20 1744 4\' 11"  (1.499 m)     Head Circumference --      Peak Flow --      Pain Score 06/16/20 1749 6     Pain Loc --      Pain Edu? --      Excl. in GC? --       Constitutional: Alert and oriented. Well appearing and in no acute distress. Eyes: Conjunctivae are normal. PERRL. EOMI. Head: Atraumatic. ENT:      Ears:      Nose: No congestion/rhinnorhea.      Mouth/Throat: Mucous membranes are moist.  Neck: No stridor.   Cardiovascular: Normal rate, regular rhythm.  Good peripheral circulation. Respiratory: Normal respiratory effort without tachypnea or retractions. Lungs CTAB. Good air entry to the bases with no decreased or absent breath sounds. Gastrointestinal: Bowel sounds 4 quadrants. Soft and nontender to palpation. No guarding or rigidity. No palpable masses. No distention.  Musculoskeletal: Full range of motion to all extremities. No gross deformities appreciated.  Tenderness to palpation throughout lumbar spine and lumbar paraspinal muscles.  Strength equal in lower extremities bilaterally.  Able to perform resisted flexion and extension of bilateral ankles.  Full range of motion of toes.  Mild calf tenderness to palpation. Neurologic:  Normal speech and language. No gross focal neurologic deficits are appreciated.  Skin:  Skin is warm, dry and intact. No rash noted. Psychiatric: Mood and affect are normal. Speech and behavior are normal. Patient exhibits appropriate insight and judgement.   ____________________________________________   LABS (all labs ordered are listed, but only abnormal results are displayed)  Labs Reviewed  URINALYSIS, COMPLETE (UACMP) WITH MICROSCOPIC - Abnormal; Notable for the following components:      Result Value   Color, Urine YELLOW (*)    APPearance CLOUDY (*)    Leukocytes,Ua LARGE (*)    Bacteria, UA RARE (*)    All other components within normal limits  BASIC METABOLIC PANEL - Abnormal; Notable for the following components:   Potassium 3.4 (*)    Calcium 8.6 (*)    All other components within normal limits  CBC - Abnormal; Notable for the following components:   Hemoglobin 10.1 (*)    HCT  35.0 (*)    MCV 77.3 (*)    MCH 22.3 (*)    MCHC 28.9 (*)    RDW 16.0 (*)    All other components within normal limits  URINE CULTURE  PREGNANCY, URINE   ____________________________________________  EKG   ____________________________________________  RADIOLOGY 08/16/20, personally viewed and evaluated these images (plain radiographs) as part of my medical decision making, as well as reviewing the written report by the radiologist.  DG Lumbar Spine 2-3 Views  Result Date: 06/16/2020 CLINICAL DATA:  Back pain EXAM: LUMBAR SPINE - 2-3 VIEW COMPARISON:  None. FINDINGS: There is no evidence of lumbar spine fracture. Alignment is normal. Intervertebral disc spaces are maintained. No significant  facet hypertrophy. IMPRESSION: Negative. Electronically Signed   By: Guadlupe Spanish M.D.   On: 06/16/2020 20:43   US Venous Img Lower Bilateral  Result Date: 06/16/2020 CLINICAL DATA:  Bilateral calf pain EXAM: Bilateral LOWER EXTREMITY VENOUS DOPPLER ULTRASOUND TECHNIQUE: Gray-scale sonography with compression, as well as color and duplex ultrasound, were performed to evaluate the deep venous system(s) from the level of the common femoral vein through the popliteal and proximal calf veins. COMPARISON:  None. FINDINGS: VENOUS Normal compressibility of the common femoral, superficial femoral, and popliteal veins, as well as the visualized calf veins. Visualized portions of profunda femoral vein and great saphenous vein unremarkable. No filling defects to suggest DVT on grayscale or color Doppler imaging. Doppler waveforms show normal direction of venous flow, normal respiratory plasticity and response to augmentation. OTHER None. Limitations: none IMPRESSION: Negative. Electronically Signed   By: Jasmine Pang M.D.   On: 06/16/2020 21:17    ____________________________________________    PROCEDURES  Procedure(s) performed:    Procedures    Medications  potassium chloride SA (KLOR-CON)  CR tablet 40 mEq (40 mEq Oral Given 06/16/20 2229)  cephALEXin (KEFLEX) capsule 500 mg (500 mg Oral Given 06/16/20 2229)     ____________________________________________   INITIAL IMPRESSION / ASSESSMENT AND PLAN / ED COURSE  Pertinent labs & imaging results that were available during my care of the patient were reviewed by me and considered in my medical decision making (see chart for details).  Review of the [place] CSRS was performed in accordance of the [facility] prior to dispensing any controlled drugs.   Patient presented to the emergency department for evaluation of low back pain.  Vital signs and exam are reassuring.  Lab work is consistent with anemia and mild hypokalemia.  She has a history of anemia, and she was given a dose of potassium for potassium of 3.4. Lumbar xray is negative for acute abnormalities. No DVT on ultrasound.  Urinalysis has leukocytes, WBC, and bacteria. She will be covered for urinary tract infection with keflex. Patient will be discharged home with prescriptions for backlofen, keflex, motrin. Patient is to follow up with PCP as directed. Patient is given ED precautions to return to the ED for any worsening or new symptoms.  Monica Gonzalez was evaluated in Emergency Department on 06/16/2020 for the symptoms described in the history of present illness. She was evaluated in the context of the global COVID-19 pandemic, which necessitated consideration that the patient might be at risk for infection with the SARS-CoV-2 virus that causes COVID-19. Institutional protocols and algorithms that pertain to the evaluation of patients at risk for COVID-19 are in a state of rapid change based on information released by regulatory bodies including the CDC and federal and state organizations. These policies and algorithms were followed during the patient's care in the ED.   ____________________________________________  FINAL CLINICAL IMPRESSION(S) / ED DIAGNOSES  Final diagnoses:  Acute  bilateral low back pain, unspecified whether sciatica present  Urinary tract infection without hematuria, site unspecified  Hypokalemia  Anemia, unspecified type      NEW MEDICATIONS STARTED DURING THIS VISIT:  ED Discharge Orders         Ordered    Baclofen 5 MG TABS  3 times daily PRN     Discontinue  Reprint     06/16/20 2210    cephALEXin (KEFLEX) 500 MG capsule  3 times daily     Discontinue  Reprint     06/16/20 2210  ibuprofen (ADVIL) 600 MG tablet  Every 6 hours PRN     Discontinue  Reprint     06/16/20 2210              This chart was dictated using voice recognition software/Dragon. Despite best efforts to proofread, errors can occur which can change the meaning. Any change was purely unintentional.    Enid DerryWagner, Alvenia Treese, PA-C 06/17/20 2306    Jene EveryKinner, Robert, MD 06/19/20 [phone]

## 2020-06-16 NOTE — ED Triage Notes (Signed)
Pt presents to ED via POV with c/o bilateral calf pain worse with ambulation, pt also c/o lower back pain at this time. Pt states pain has been increasing over the last few days.

## 2020-06-17 LAB — URINE CULTURE

## 2020-06-29 DIAGNOSIS — R0789 Other chest pain: Secondary | ICD-10-CM | POA: Insufficient documentation

## 2020-06-29 DIAGNOSIS — Z8249 Family history of ischemic heart disease and other diseases of the circulatory system: Secondary | ICD-10-CM | POA: Insufficient documentation

## 2020-07-13 ENCOUNTER — Other Ambulatory Visit: Payer: Self-pay | Admitting: Obstetrics & Gynecology

## 2020-07-13 DIAGNOSIS — Z1231 Encounter for screening mammogram for malignant neoplasm of breast: Secondary | ICD-10-CM

## 2020-07-13 IMAGING — CR DG ABDOMEN 1V
1 series · 1 of 1 positions shown · non-contrast
Comparison: None.

CLINICAL DATA: Abdominal pain for 2 days

EXAM:
ABDOMEN - 1 VIEW

[dg abd 1 view]
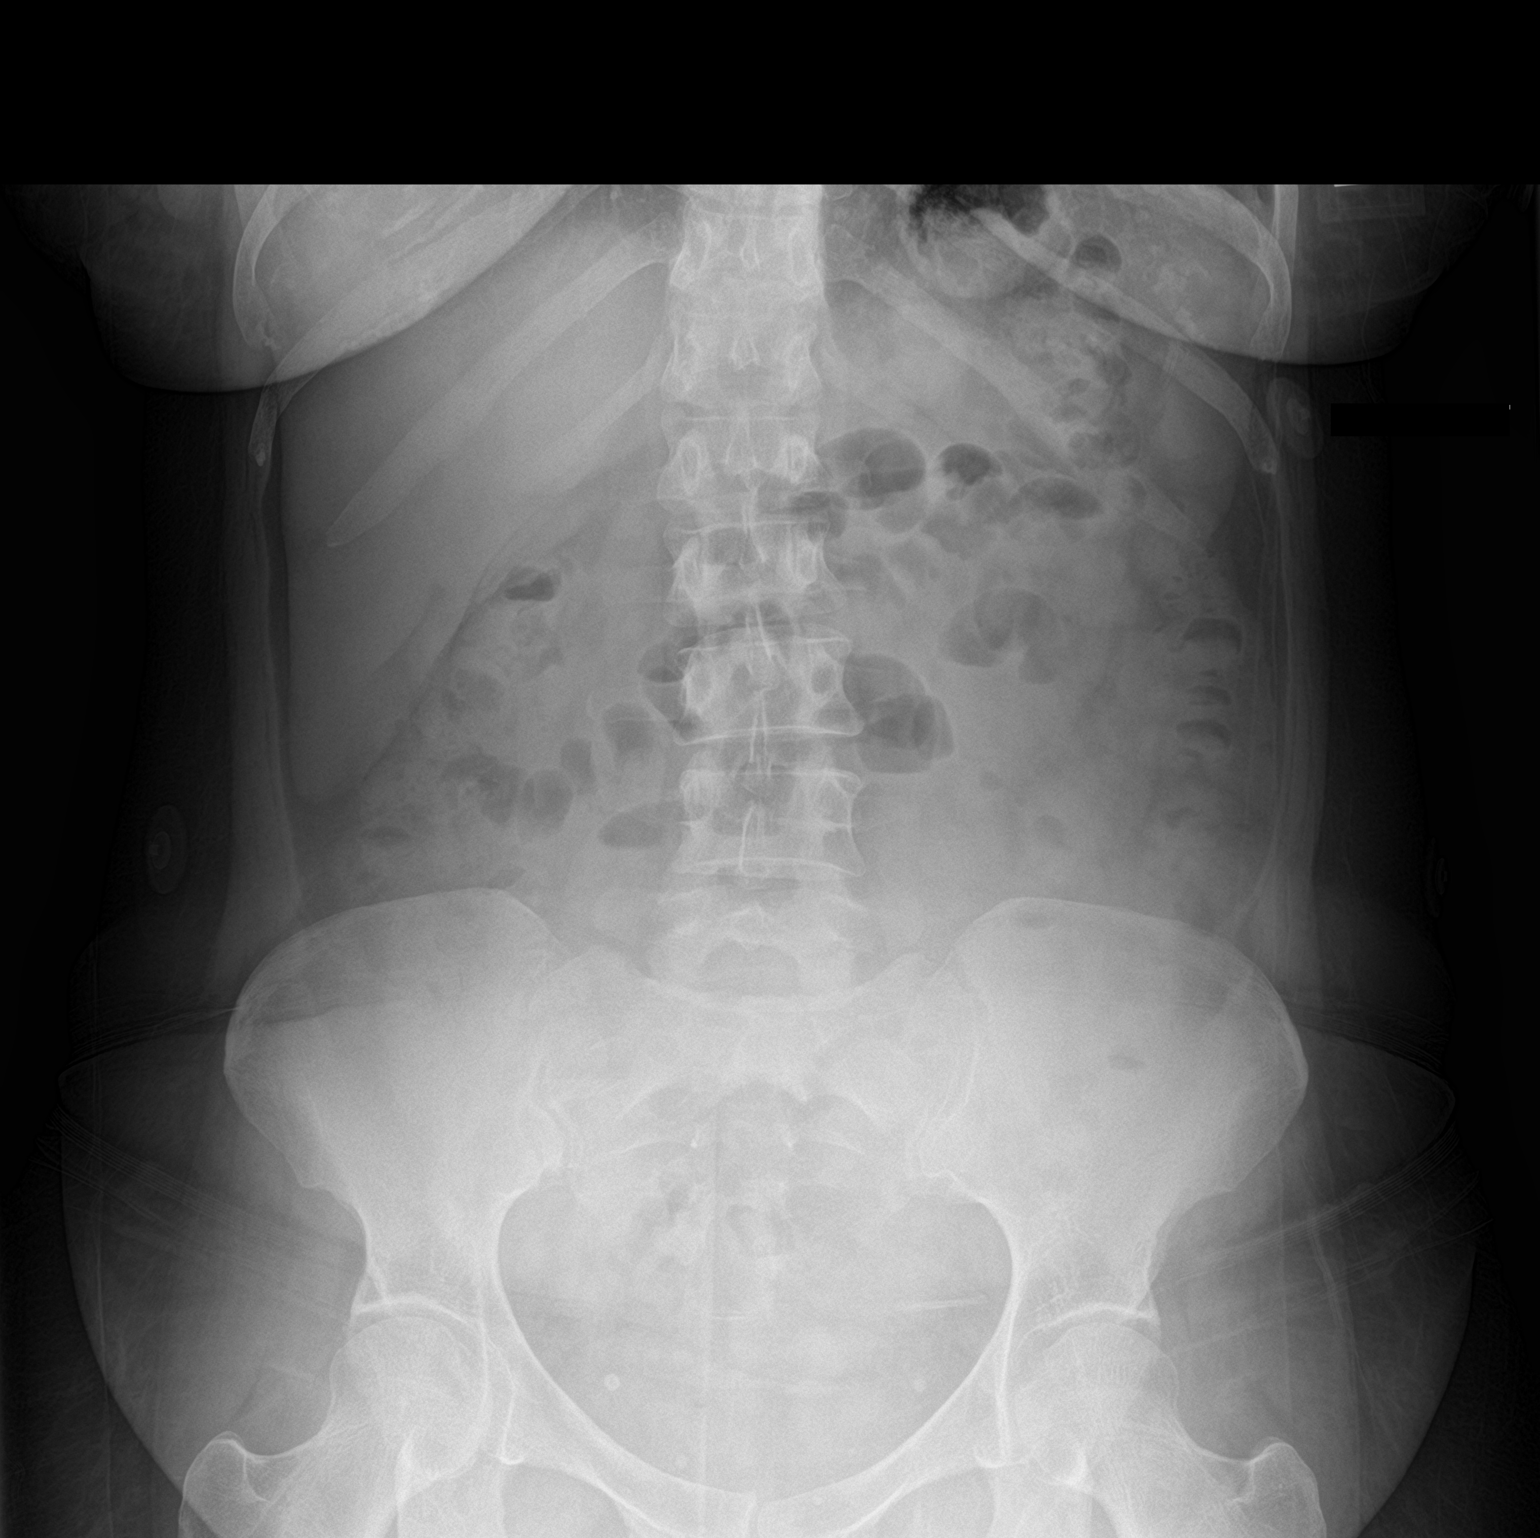

[1 of 1 positions shown; findings below may reference images not displayed]

FINDINGS: Scattered large and small bowel gas is noted. Mild retained fecal
material is seen. No obstructive changes are seen. No free air is
noted. No bony abnormality is noted.
IMPRESSION: No acute abnormality seen.

## 2020-07-15 ENCOUNTER — Other Ambulatory Visit: Payer: Self-pay

## 2020-07-15 ENCOUNTER — Inpatient Hospital Stay: Payer: 59 | Attending: Obstetrics and Gynecology | Admitting: Obstetrics and Gynecology

## 2020-07-15 DIAGNOSIS — D071 Carcinoma in situ of vulva: Secondary | ICD-10-CM | POA: Diagnosis not present

## 2020-07-15 HISTORY — DX: Carcinoma in situ of vulva: D07.1

## 2020-07-15 NOTE — H&P (View-Only) (Signed)
Gynecologic Oncology Consult Visit   Referring Provider: Dr Leeroy Bock Ward  Chief Complaint: VIN3  Subjective:  Monica Gonzalez is a 44 y.o. female who is seen in consultation from Dr. Elesa Massed for VIN III   She was referred to Dr. Elesa Massed from Dr. Sampson Goon for small indurated cystic structure of lower vulva x 2 weeks, painful and raw. On exam, Dr. Elesa Massed noted 1.5 x 1.5 cm superficially ulcerated area extending past labia minor to labia majora in posterior foreschette. Also, on left, area of induration where inferior apex of labia minor ends. No enlarged lymph nodes.   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  Healing well from biopsy.  No history of vulvar warts or other pathology.  Problem List: Patient Active Problem List   Diagnosis Date Noted  . 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 Asthma   . Lupus [facility])     Past Surgical History: Past Surgical History:  Procedure Laterality Date  . TONSILLECTOMY    . TUBAL LIGATION      OB History:  OB History  Gravida Para Term Preterm AB Living  5       2 3   SAB TAB Ectopic Multiple Live Births  1            # Outcome Date GA Lbr Len/2nd Weight Sex Delivery Anes PTL Lv  5 Gravida           4 Gravida           3 Gravida           2 AB           1 SAB             Family History: Family History  Problem Relation Age of Onset  . Diabetes Mother   . Congestive Heart Failure Mother     Social History: Social History   Socioeconomic History  . Marital status: Legally Separated    Spouse name: Not on file  . Number of children: Not on file  . Years of education: Not on file  . Highest education level: Not on file  Occupational History  . Not on file  Tobacco Use  . Smoking status: Never Smoker  . Smokeless tobacco: Never Used  Substance and Sexual  Activity  . Alcohol use: Yes  . Drug use: No  . Sexual activity: Yes  Other Topics Concern  . Not on file  Social History Narrative  . Not on file   Social Determinants of Health   Financial Resource Strain:   . Difficulty of Paying Living Expenses: Not on file  Food Insecurity:   . Worried About in the Last Year: Not on file  . Ran Out of Food in the Last Year: Not on file  Transportation Needs:   . Lack of Transportation (Medical): Not on file  . Lack of Transportation (Non-Medical): Not on file  Physical Activity:   . Days of Exercise per Week: Not on file  . Minutes of Exercise per Session: Not on file  Stress:   . Feeling of Stress : Not on file  Social Connections:   . Frequency of Communication with Friends and Family: Not on file  . Frequency of Social Gatherings with Friends and Family: Not on file  .  Attends Religious Services: Not on file  . Active Member of Clubs or Organizations: Not on file  . Attends Banker Meetings: Not on file  . Marital Status: Not on file  Intimate Partner Violence:   . Fear of Current or Ex-Partner: Not on file  . Emotionally Abused: Not on file  . Physically Abused: Not on file  . Sexually Abused: Not on file    Allergies: Allergies  Allergen Reactions  . Prednisone Swelling    Current Medications: Current Outpatient Medications  Medication Sig Dispense Refill  . albuterol (PROVENTIL HFA) 108 (90 Base) MCG/ACT inhaler Inhale 2 puffs into the lungs every 4 (four) hours as needed for wheezing or shortness of breath. 1 Inhaler 1  . alum & mag hydroxide-simeth (MAALOX MAX) 400-400-40 MG/5ML suspension Take 5 mLs by mouth every 6 (six) hours as needed for indigestion. 355 mL 0  . amoxicillin (AMOXIL) 500 MG capsule Take 1 capsule (500 mg total) by mouth 3 (three) times daily. 30 capsule 0  . Baclofen 5 MG TABS Take 5 mg by mouth 3 (three) times daily as needed. 15 tablet 0  . famotidine (PEPCID) 40  MG tablet Take 1 tablet (40 mg total) by mouth daily. 60 tablet 0  . ibuprofen (ADVIL) 600 MG tablet Take 1 tablet (600 mg total) by mouth every 6 (six) hours as needed. 30 tablet 0  . lidocaine (XYLOCAINE) 2 % solution Use as directed 10 mLs in the mouth or throat as needed for mouth pain. 100 mL 0  . meloxicam (MOBIC) 15 MG tablet Take 1 tablet (15 mg total) by mouth daily. 30 tablet 0  . ondansetron (ZOFRAN ODT) 4 MG disintegrating tablet Take 1 tablet (4 mg total) by mouth every 8 (eight) hours as needed for nausea or vomiting. 20 tablet 0  . traMADol (ULTRAM) 50 MG tablet Take 1 tablet (50 mg total) by mouth every 6 (six) hours as needed. 10 tablet 0   No current facility-administered medications for this visit.    General: negative for fevers, changes in weight or night sweats.  Skin: negative for changes in moles or sores or rash Eyes: negative for changes in vision. Impaired vision HEENT: negative for change in hearing, tinnitus, voice changes Pulmonary: negative for dyspnea, orthopnea, productive cough, wheezing Cardiac: negative for pain. Positive for palpitations Gastrointestinal: negative for nausea, vomiting, constipation, diarrhea, hematemesis, hematochezia Genitourinary/Sexual: negative for dysuria, retention, hematuria, incontinence Musculoskeletal: negative for pain, back pain. Positive for joint pain Hematology: negative for easy bruising, abnormal bleeding Neurologic/Psych: negative for headaches, seizures, paralysis, numbness. Positive for weakness   Objective:  Physical Examination:  BP 108/71   Pulse 71   Temp 97.8 F (36.6 C)   Resp 20   Wt 181 lb 9.6 oz (82.4 kg)   SpO2 100%   BMI 36.68 kg/m     ECOG Performance Status: 0 - Asymptomatic  GENERAL: Patient is a well appearing female in no acute distress HEENT:  PERRL, neck supple with midline trachea. Thyroid without masses.  NODES:  No cervical, supraclavicular, axillary, or inguinal lymphadenopathy  palpated.  LUNGS:  Clear to auscultation bilaterally.  No wheezes or rhonchi. HEART:  Regular rate and rhythm. No murmur appreciated. ABDOMEN:  Soft, nontender.  Positive, normoactive bowel sounds.  MSK:  No focal spinal tenderness to palpation. Full range of motion bilaterally in the upper extremities. EXTREMITIES:  No peripheral edema.   SKIN:  Clear with no obvious rashes or skin changes. No nail dyscrasia.  NEURO:  Nonfocal. Well oriented.  Appropriate affect.  Pelvic: Exam chaperoned by nursing EGBUS: biopsy site at 5:30 near introitus healing well. No gross lesion seen, but some skin tags at introitus. The area where the biopsy was done is a bit indurated. Cervix: no lesions, nontender, mobile Vagina: no lesions, no discharge or bleeding Uterus: normal size, nontender, mobile Adnexa: no palpable masses Rectovaginal: confirmatory  Colposcopy: Examination of the vulva done after soaking with acetic acid.  Small area of acetowhite epithelium remaining at 5:30 on both sides of biopsy site.    Assessment:  Monica Gonzalez is a 44 y.o. 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 No suspicion for invasive cancer, but area a bit indurated post biopsy.   Medical co-morbidities complicating care: Asthma, Lupus.  Plan:   Problem List Items Addressed This Visit      Genitourinary   VIN III (vulvar intraepithelial neoplasia III)     We discussed options for management including wide local excision. The area is a bit to big to biopsy off well in clinic with local anesthesia.  Will plan for WLE in OR on 07/29/20.  The risks of surgery were discussed in detail and she understands these to include infection; wound separation; delayed wound healing; injury to adjacent structures such as vagina, bladder, bowel, urethra, blood vessels, anus and nerves;  failure to remove the entire affected area; recurrence of dysplasia; bleeding which may require blood transfusion; anesthesia  risk; thromboembolic events; possible death; unforeseen complications; possible need for further surgery; medical complications such as heart attack, stroke, pleural effusion and pneumonia.  She voiced a clear understanding.  She had the opportunity to ask questions and written informed consent was obtained today.   The patient's diagnosis, an outline of the further diagnostic and laboratory studies which will be required, the recommendation for surgery, and alternatives were discussed with her and her accompanying family members.  All questions were answered to their satisfaction.  A total of 40 minutes were spent with the patient/family today; 60% was spent in education, counseling and coordination of care for VIN3.    Alinda Dooms, NP  I personally interviewed and examined the patient. Agreed with the above/below plan of care. I have directly contributed to assessment and plan of care of this patient and educated and discussed with patient and family.  Leida Lauth, MD  CC:  Ward, Elenora Fender, MD [address] [place],  [place] [address] [phone]

## 2020-07-15 NOTE — Progress Notes (Signed)
Gynecologic Oncology Consult Visit   Referring Provider: Dr Leeroy Bock Ward  Chief Complaint: VIN3  Subjective:  Monica Gonzalez is a 44 y.o. female who is seen in consultation from Dr. Elesa Massed for VIN III   She was referred to Dr. Elesa Massed from Dr. Sampson Goon for small indurated cystic structure of lower vulva x 2 weeks, painful and raw. On exam, Dr. Elesa Massed noted 1.5 x 1.5 cm superficially ulcerated area extending past labia minor to labia majora in posterior foreschette. Also, on left, area of induration where inferior apex of labia minor ends. No enlarged lymph nodes.   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  Healing well from biopsy.  No history of vulvar warts or other pathology.  Problem List: Patient Active Problem List   Diagnosis Date Noted  . 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 Asthma   . Lupus [facility])     Past Surgical History: Past Surgical History:  Procedure Laterality Date  . TONSILLECTOMY    . TUBAL LIGATION      OB History:  OB History  Gravida Para Term Preterm AB Living  5       2 3   SAB TAB Ectopic Multiple Live Births  1            # Outcome Date GA Lbr Len/2nd Weight Sex Delivery Anes PTL Lv  5 Gravida           4 Gravida           3 Gravida           2 AB           1 SAB             Family History: Family History  Problem Relation Age of Onset  . Diabetes Mother   . Congestive Heart Failure Mother     Social History: Social History   Socioeconomic History  . Marital status: Legally Separated    Spouse name: Not on file  . Number of children: Not on file  . Years of education: Not on file  . Highest education level: Not on file  Occupational History  . Not on file  Tobacco Use  . Smoking status: Never Smoker  . Smokeless tobacco: Never Used  Substance and Sexual  Activity  . Alcohol use: Yes  . Drug use: No  . Sexual activity: Yes  Other Topics Concern  . Not on file  Social History Narrative  . Not on file   Social Determinants of Health   Financial Resource Strain:   . Difficulty of Paying Living Expenses: Not on file  Food Insecurity:   . Worried About in the Last Year: Not on file  . Ran Out of Food in the Last Year: Not on file  Transportation Needs:   . Lack of Transportation (Medical): Not on file  . Lack of Transportation (Non-Medical): Not on file  Physical Activity:   . Days of Exercise per Week: Not on file  . Minutes of Exercise per Session: Not on file  Stress:   . Feeling of Stress : Not on file  Social Connections:   . Frequency of Communication with Friends and Family: Not on file  . Frequency of Social Gatherings with Friends and Family: Not on file  .  Attends Religious Services: Not on file  . Active Member of Clubs or Organizations: Not on file  . Attends Banker Meetings: Not on file  . Marital Status: Not on file  Intimate Partner Violence:   . Fear of Current or Ex-Partner: Not on file  . Emotionally Abused: Not on file  . Physically Abused: Not on file  . Sexually Abused: Not on file    Allergies: Allergies  Allergen Reactions  . Prednisone Swelling    Current Medications: Current Outpatient Medications  Medication Sig Dispense Refill  . albuterol (PROVENTIL HFA) 108 (90 Base) MCG/ACT inhaler Inhale 2 puffs into the lungs every 4 (four) hours as needed for wheezing or shortness of breath. 1 Inhaler 1  . alum & mag hydroxide-simeth (MAALOX MAX) 400-400-40 MG/5ML suspension Take 5 mLs by mouth every 6 (six) hours as needed for indigestion. 355 mL 0  . amoxicillin (AMOXIL) 500 MG capsule Take 1 capsule (500 mg total) by mouth 3 (three) times daily. 30 capsule 0  . Baclofen 5 MG TABS Take 5 mg by mouth 3 (three) times daily as needed. 15 tablet 0  . famotidine (PEPCID) 40  MG tablet Take 1 tablet (40 mg total) by mouth daily. 60 tablet 0  . ibuprofen (ADVIL) 600 MG tablet Take 1 tablet (600 mg total) by mouth every 6 (six) hours as needed. 30 tablet 0  . lidocaine (XYLOCAINE) 2 % solution Use as directed 10 mLs in the mouth or throat as needed for mouth pain. 100 mL 0  . meloxicam (MOBIC) 15 MG tablet Take 1 tablet (15 mg total) by mouth daily. 30 tablet 0  . ondansetron (ZOFRAN ODT) 4 MG disintegrating tablet Take 1 tablet (4 mg total) by mouth every 8 (eight) hours as needed for nausea or vomiting. 20 tablet 0  . traMADol (ULTRAM) 50 MG tablet Take 1 tablet (50 mg total) by mouth every 6 (six) hours as needed. 10 tablet 0   No current facility-administered medications for this visit.    General: negative for fevers, changes in weight or night sweats.  Skin: negative for changes in moles or sores or rash Eyes: negative for changes in vision. Impaired vision HEENT: negative for change in hearing, tinnitus, voice changes Pulmonary: negative for dyspnea, orthopnea, productive cough, wheezing Cardiac: negative for pain. Positive for palpitations Gastrointestinal: negative for nausea, vomiting, constipation, diarrhea, hematemesis, hematochezia Genitourinary/Sexual: negative for dysuria, retention, hematuria, incontinence Musculoskeletal: negative for pain, back pain. Positive for joint pain Hematology: negative for easy bruising, abnormal bleeding Neurologic/Psych: negative for headaches, seizures, paralysis, numbness. Positive for weakness   Objective:  Physical Examination:  BP 108/71   Pulse 71   Temp 97.8 F (36.6 C)   Resp 20   Wt 181 lb 9.6 oz (82.4 kg)   SpO2 100%   BMI 36.68 kg/m     ECOG Performance Status: 0 - Asymptomatic  GENERAL: Patient is a well appearing female in no acute distress HEENT:  PERRL, neck supple with midline trachea. Thyroid without masses.  NODES:  No cervical, supraclavicular, axillary, or inguinal lymphadenopathy  palpated.  LUNGS:  Clear to auscultation bilaterally.  No wheezes or rhonchi. HEART:  Regular rate and rhythm. No murmur appreciated. ABDOMEN:  Soft, nontender.  Positive, normoactive bowel sounds.  MSK:  No focal spinal tenderness to palpation. Full range of motion bilaterally in the upper extremities. EXTREMITIES:  No peripheral edema.   SKIN:  Clear with no obvious rashes or skin changes. No nail dyscrasia.  NEURO:  Nonfocal. Well oriented.  Appropriate affect.  Pelvic: Exam chaperoned by nursing EGBUS: biopsy site at 5:30 near introitus healing well. No gross lesion seen, but some skin tags at introitus. The area where the biopsy was done is a bit indurated. Cervix: no lesions, nontender, mobile Vagina: no lesions, no discharge or bleeding Uterus: normal size, nontender, mobile Adnexa: no palpable masses Rectovaginal: confirmatory  Colposcopy: Examination of the vulva done after soaking with acetic acid.  Small area of acetowhite epithelium remaining at 5:30 on both sides of biopsy site.    Assessment:  Monica Gonzalez is a 44 y.o. 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 No suspicion for invasive cancer, but area a bit indurated post biopsy.   Medical co-morbidities complicating care: Asthma, Lupus.  Plan:   Problem List Items Addressed This Visit      Genitourinary   VIN III (vulvar intraepithelial neoplasia III)     We discussed options for management including wide local excision. The area is a bit to big to biopsy off well in clinic with local anesthesia.  Will plan for WLE in OR on 07/29/20.  The risks of surgery were discussed in detail and she understands these to include infection; wound separation; delayed wound healing; injury to adjacent structures such as vagina, bladder, bowel, urethra, blood vessels, anus and nerves;  failure to remove the entire affected area; recurrence of dysplasia; bleeding which may require blood transfusion; anesthesia  risk; thromboembolic events; possible death; unforeseen complications; possible need for further surgery; medical complications such as heart attack, stroke, pleural effusion and pneumonia.  She voiced a clear understanding.  She had the opportunity to ask questions and written informed consent was obtained today.   The patient's diagnosis, an outline of the further diagnostic and laboratory studies which will be required, the recommendation for surgery, and alternatives were discussed with her and her accompanying family members.  All questions were answered to their satisfaction.  A total of 40 minutes were spent with the patient/family today; 60% was spent in education, counseling and coordination of care for VIN3.    Alinda Dooms, NP  I personally interviewed and examined the patient. Agreed with the above/below plan of care. I have directly contributed to assessment and plan of care of this patient and educated and discussed with patient and family.  Leida Lauth, MD  CC:  Ward, Elenora Fender, MD [address] [place],  [place] [address] [phone]

## 2020-07-15 NOTE — Patient Instructions (Signed)
In Preparation of your upcoming procedure you are having testing for Covid-19 on Monday, 07/27/20, at the [facility]. This is a drive-thru testing site. Please come between 8:00am-1:00pm.  We are asking that you stay at home and avoid visitors from this point until after your procedure is completed to minimize potential for Covid-19 exposure.    Please Wash your hands frequently with soap and water or clean hands with an alcohol-based hand sanitizer often.  Do not touch your eyes, nose, and mouth, especially with unwashed hands.  Should you at any time develop new symptoms of fever, cough, sneezing or runny nose, sore throat, difficulty breathing, or unexplained body aches, we ask that you contact your provider.   It may take up to 48 hours for results of this testing to be made available.   You will not receive notification if test results are negative.  If test results are positive for Covid-19, your provider or his/her representative will notify you by phone, with additional instructions.    GYN Oncology Patients Preparation for Surgery   The following instructions are important to prepare for your surgery. Please follow them carefully   Your surgery is scheduled for 07/29/2020.     You will speak with an anesthesia provider before your surgery. This may be a telephone interview or an in-person appointment. Make sure you are ready to review a complete list of your medications (including vitamins and over-the-counter medications) with the anesthesia team.   Do not eat after midnight the night before your surgery.  You may have clear liquids until 1 hour before your scheduled arrival time.  Clear liquids that you may drink after midnight are water, black tea, black coffee (without sugar, cream, honey, or artificial creamers) or a special drink that has been given to you in the Pre Admission Testing or surgery clinic. Nothing by mouth starting 1 hour prior to arrival at the  hospital. "Nothing by mouth" means no liquids, gum, candy, etc. If you have questions about your upcoming surgery, please contact our office    Vulvectomy, Wide Local Excision, or Laser Ablation: Surgical Information  The vulva is the external female genitalia, outside and around the vagina and pubic bone. It consists of:  The skin on, and in front of, the pubic bone.  The clitoris.  The labia majora (large lips) on the outside of the vagina.  The labia minora (small lips) around the opening of the vagina.  The opening and the skin in and around the vagina.   A vulvectomy or wide local excision of the vulva is the removal of the tissue of the vulva, which sometimes includes removal of the lymph nodes and tissue in the groin areas.  After surgery, a doctor will look at the tissue under a microscope. He or she will check it for abnormal cells.    Laser ablation is a procedure used to treat precancerous skin changes. The top layers of skin are removed with the laser. The wound is then covered with ointment. A scab will form over the area. The wound may take 3 to 6 week s to heal, depending on the size of the area treated. Good wound care may help the scar fade with time.  Most women go home 1 to 4 hours after surgery. You may need to stay overnight if you had lymph nodes removed. You will probably be able to return to your normal routine in 1 or 2 weeks.    Incision Care  after surgery Do not douche or use tampons  Do not have sexual intercourse until your caregiver gives you permission.  After moving your bowels or urinating use a bottle filled with warm water (peri-bottle) to clean the area. Gently wipe from front to back or pat the skin dry. You may use a blow dryer on the cool setting to help dry the skin.  Wear loose cotton underwear. Do not wear tight pants.  A sitz bath will help keep your perineal area clean, reduce swelling, and provide comfort.  You may notice that your urine stream is  at a different angle, and may spray. Using a plastic funnel may help decrease spray  Avoid exercises, such as running or biking for 3 weeks or as directed.  If your doctor has removed lymph nodes from your groin area, there may be an increase in swelling of your legs and feet. You can help prevent swelling by doing the following:    Elevate your legs while sitting or lying down.    Avoid standing in one place for long periods of time.    Avoid salt in your diet. It can cause fluid retention and swelling.    Do not cross your legs, especially when sitting.   Follow-up care is a key part of your treatment and safety. Be sure to make and go to all appointments, and call your doctor if you are having problems. It's also a good idea to know your test results and keep a list of the me dicines you take.

## 2020-07-15 NOTE — Progress Notes (Signed)
Surgery has been scheduled for 07/29/20. Copy of consent faxed to PAT with confirmation of receipt. Original consent sent to medical records for filing.

## 2020-07-16 ENCOUNTER — Telehealth: Payer: Self-pay

## 2020-07-16 NOTE — Telephone Encounter (Signed)
Attempted to call with appointment details for PAT visit. Voicemail full. My Chart message sent.

## 2020-07-23 ENCOUNTER — Encounter
Admission: RE | Admit: 2020-07-23 | Discharge: 2020-07-23 | Disposition: A | Discharge status: Home / Self Care | Payer: 59 | Source: Ambulatory Visit | Attending: Obstetrics and Gynecology | Admitting: Obstetrics and Gynecology

## 2020-07-23 ENCOUNTER — Other Ambulatory Visit: Payer: Self-pay

## 2020-07-23 DIAGNOSIS — R002 Palpitations: Secondary | ICD-10-CM | POA: Insufficient documentation

## 2020-07-23 HISTORY — DX: Cardiac murmur, unspecified: R01.1

## 2020-07-23 HISTORY — DX: Gastro-esophageal reflux disease without esophagitis: K21.9

## 2020-07-23 HISTORY — DX: Personal history of other diseases of the digestive system: Z87.19

## 2020-07-23 NOTE — Patient Instructions (Signed)
Your procedure is scheduled on: 07/29/20 Report to [place]. To find out your arrival time please call [phone] between 1PM - 3PM on 07/28/20.  Remember: Instructions that are not followed completely may result in serious medical risk, up to and including death, or upon the discretion of your surgeon and anesthesiologist your surgery may need to be rescheduled.     _X__ 1. Do not eat food after midnight the night before your procedure.                 No gum chewing or hard candies. You may drink clear liquids up to 2 hours                 before you are scheduled to arrive for your surgery- DO not drink clear                 liquids within 2 hours of the start of your surgery.                 Clear Liquids include:  water, apple juice without pulp, clear carbohydrate                 drink such as Clearfast or Gatorade, Black Coffee or Tea (Do not add                 anything to coffee or tea). Diabetics water only  __X__2.  On the morning of surgery brush your teeth with toothpaste and water, you                 may rinse your mouth with mouthwash if you wish.  Do not swallow any              toothpaste of mouthwash.     _X__ 3.  No Alcohol for 24 hours before or after surgery.   _X__ 4.  Do Not Smoke or use e-cigarettes For 24 Hours Prior to Your Surgery.                 Do not use any chewable tobacco products for at least 6 hours prior to                 surgery.  ____  5.  Bring all medications with you on the day of surgery if instructed.   __X__  6.  Notify your doctor if there is any change in your medical condition      (cold, fever, infections).     Do not wear jewelry, make-up, hairpins, clips or nail polish. Do not wear lotions, powders, or perfumes.  Do not shave 48 hours prior to surgery. Men may shave face and neck. Do not bring valuables to the hospital.    [facility] is not responsible for any belongings or  valuables.  Contacts, dentures/partials or body piercings may not be worn into surgery. Bring a case for your contacts, glasses or hearing aids, a denture cup will be supplied. Leave your suitcase in the car. After surgery it may be brought to your room. For patients admitted to the hospital, discharge time is determined by your treatment team.   Patients discharged the day of surgery will not be allowed to drive home.   Please read over the following fact sheets that you were given:   MRSA Information  __X__ Take these medicines the morning of surgery with A SIP OF WATER:  1. Baclofen 5 MG TABS if needed  2.   3.   4.  5.  6.  ____ Fleet Enema (as directed)   ____ Use CHG Soap/SAGE wipes as directed  __X__ Use inhalers on the day of surgery  ____ Stop metformin/Janumet/Farxiga 2 days prior to surgery    ____ Take 1/2 of usual insulin dose the night before surgery. No insulin the morning          of surgery.   ____ Stop Blood Thinners Coumadin/Plavix/Xarelto/Pleta/Pradaxa/Eliquis/Effient/Aspirin  on   Or contact your Surgeon, Cardiologist or Medical Doctor regarding  ability to stop your blood thinners  __X__ Stop Anti-inflammatories 7 days before surgery such as Advil, Ibuprofen, Motrin,  BC or Goodies Powder, Naprosyn, Naproxen, Aleve, Aspirin    __X__ Stop all herbal supplements, fish oil or vitamin E until after surgery.    ____ Bring C-Pap to the hospital.

## 2020-07-27 ENCOUNTER — Other Ambulatory Visit: Payer: Self-pay

## 2020-07-27 ENCOUNTER — Other Ambulatory Visit
Admission: RE | Admit: 2020-07-27 | Discharge: 2020-07-27 | Disposition: A | Discharge status: Home / Self Care | Payer: 59 | Source: Ambulatory Visit | Attending: Obstetrics and Gynecology | Admitting: Obstetrics and Gynecology

## 2020-07-27 DIAGNOSIS — Z20822 Contact with and (suspected) exposure to covid-19: Secondary | ICD-10-CM | POA: Insufficient documentation

## 2020-07-27 DIAGNOSIS — Z01812 Encounter for preprocedural laboratory examination: Secondary | ICD-10-CM | POA: Diagnosis not present

## 2020-07-27 LAB — COMPREHENSIVE METABOLIC PANEL
ALT: 12 U/L (ref 0–44)
AST: 16 U/L (ref 15–41)
Albumin: 3.7 g/dL (ref 3.5–5.0)
Alkaline Phosphatase: 48 U/L (ref 38–126)
Anion gap: 11 (ref 5–15)
BUN: 9 mg/dL (ref 6–20)
CO2: 23 mmol/L (ref 22–32)
Calcium: 8.8 mg/dL — ABNORMAL LOW (ref 8.9–10.3)
Chloride: 104 mmol/L (ref 98–111)
Creatinine, Ser: 0.89 mg/dL (ref 0.44–1.00)
GFR calc Af Amer: >60 mL/min (ref >60)
GFR calc non Af Amer: >60 mL/min (ref >60)
Glucose, Bld: 83 mg/dL (ref 70–99)
Potassium: 3.4 mmol/L — ABNORMAL LOW (ref 3.5–5.1)
Sodium: 138 mmol/L (ref 135–145)
Total Bilirubin: 0.8 mg/dL (ref 0.3–1.2)
Total Protein: 6.8 g/dL (ref 6.5–8.1)

## 2020-07-27 LAB — CBC WITH DIFFERENTIAL/PLATELET
Abs Immature Granulocytes: 0.03 10*3/uL (ref 0.00–0.07)
Basophils Absolute: 0.1 10*3/uL (ref 0.0–0.1)
Basophils Relative: 1 %
Eosinophils Absolute: 0.1 10*3/uL (ref 0.0–0.5)
Eosinophils Relative: 2 %
HCT: 37.6 % (ref 36.0–46.0)
Hemoglobin: 11.4 g/dL — ABNORMAL LOW (ref 12.0–15.0)
Immature Granulocytes: 0 %
Lymphocytes Relative: 27 %
Lymphs Abs: 2.2 10*3/uL (ref 0.7–4.0)
MCH: 23.7 pg — ABNORMAL LOW (ref 26.0–34.0)
MCHC: 30.3 g/dL (ref 30.0–36.0)
MCV: 78 fL — ABNORMAL LOW (ref 80.0–100.0)
Monocytes Absolute: 0.6 10*3/uL (ref 0.1–1.0)
Monocytes Relative: 8 %
Neutro Abs: 5.2 10*3/uL (ref 1.7–7.7)
Neutrophils Relative %: 62 %
Platelets: 392 10*3/uL (ref 150–400)
RBC: 4.82 MIL/uL (ref 3.87–5.11)
RDW: 18 % — ABNORMAL HIGH (ref 11.5–15.5)
WBC: 8.2 10*3/uL (ref 4.0–10.5)
nRBC: 0 % (ref 0.0–0.2)

## 2020-07-27 LAB — TYPE AND SCREEN
ABO/RH(D): O POS
Antibody Screen: NEGATIVE

## 2020-07-28 LAB — SARS CORONAVIRUS 2 (TAT 6-24 HRS): SARS Coronavirus 2: NEGATIVE

## 2020-07-29 ENCOUNTER — Ambulatory Visit
Admission: RE | Admit: 2020-07-29 | Discharge: 2020-07-29 | Disposition: A | Discharge status: Home / Self Care | Payer: 59 | Attending: Obstetrics and Gynecology | Admitting: Obstetrics and Gynecology

## 2020-07-29 ENCOUNTER — Other Ambulatory Visit: Payer: Self-pay

## 2020-07-29 ENCOUNTER — Encounter
Admission: RE | Disposition: A | Discharge status: Home / Self Care | Payer: Self-pay | Source: Home / Self Care | Attending: Obstetrics and Gynecology

## 2020-07-29 ENCOUNTER — Ambulatory Visit: Payer: 59 | Admitting: Urgent Care

## 2020-07-29 ENCOUNTER — Other Ambulatory Visit: Payer: Self-pay | Admitting: Nurse Practitioner

## 2020-07-29 ENCOUNTER — Encounter: Payer: Self-pay | Admitting: Obstetrics and Gynecology

## 2020-07-29 DIAGNOSIS — D071 Carcinoma in situ of vulva: Secondary | ICD-10-CM | POA: Diagnosis present

## 2020-07-29 HISTORY — PX: VULVECTOMY: SHX1086

## 2020-07-29 LAB — CBC
HCT: 36.7 % (ref 36.0–46.0)
Hemoglobin: 11.3 g/dL — ABNORMAL LOW (ref 12.0–15.0)
MCH: 24.4 pg — ABNORMAL LOW (ref 26.0–34.0)
MCHC: 30.8 g/dL (ref 30.0–36.0)
MCV: 79.1 fL — ABNORMAL LOW (ref 80.0–100.0)
Platelets: 358 10*3/uL (ref 150–400)
RBC: 4.64 MIL/uL (ref 3.87–5.11)
RDW: 18.1 % — ABNORMAL HIGH (ref 11.5–15.5)
WBC: 8.8 10*3/uL (ref 4.0–10.5)
nRBC: 0 % (ref 0.0–0.2)

## 2020-07-29 LAB — ABO/RH: ABO/RH(D): O POS

## 2020-07-29 LAB — POCT PREGNANCY, URINE: Preg Test, Ur: NEGATIVE

## 2020-07-29 SURGERY — WIDE EXCISION VULVECTOMY
Anesthesia: General | Site: Vulva

## 2020-07-29 MED ORDER — FAMOTIDINE 20 MG PO TABS: 20.0000 mg | ORAL_TABLET | Freq: Once | ORAL | Status: AC

## 2020-07-29 MED ORDER — CHLORHEXIDINE GLUCONATE 0.12 % MT SOLN: 15.0000 mL | Freq: Once | OROMUCOSAL | Status: AC

## 2020-07-29 MED ORDER — KETOROLAC TROMETHAMINE 30 MG/ML IJ SOLN
INTRAMUSCULAR | Status: DC | PRN
  Administered 2020-07-29: 30 mg via INTRAVENOUS

## 2020-07-29 MED ORDER — CHLORHEXIDINE GLUCONATE CLOTH 2 % EX PADS
6.0000 | MEDICATED_PAD | Freq: Once | CUTANEOUS | Status: AC
  Administered 2020-07-29: 6 via TOPICAL

## 2020-07-29 MED ORDER — DEXAMETHASONE SODIUM PHOSPHATE 10 MG/ML IJ SOLN
INTRAMUSCULAR | Status: AC
  Filled 2020-07-29: qty 1

## 2020-07-29 MED ORDER — FENTANYL CITRATE (PF) 100 MCG/2ML IJ SOLN
25.0000 ug | INTRAMUSCULAR | Status: DC | PRN
  Administered 2020-07-29 (×2): 25 ug via INTRAVENOUS

## 2020-07-29 MED ORDER — PROMETHAZINE HCL 25 MG/ML IJ SOLN: 6.2500 mg | INTRAMUSCULAR | Status: DC | PRN

## 2020-07-29 MED ORDER — ORAL CARE MOUTH RINSE: 15.0000 mL | Freq: Once | OROMUCOSAL | Status: AC

## 2020-07-29 MED ORDER — FENTANYL CITRATE (PF) 100 MCG/2ML IJ SOLN
INTRAMUSCULAR | Status: AC
  Administered 2020-07-29: 50 ug via INTRAVENOUS
  Filled 2020-07-29: qty 2

## 2020-07-29 MED ORDER — OXYCODONE HCL 5 MG/5ML PO SOLN: 5.0000 mg | Freq: Once | ORAL | Status: AC | PRN

## 2020-07-29 MED ORDER — ONDANSETRON HCL 4 MG/2ML IJ SOLN
INTRAMUSCULAR | Status: DC | PRN
  Administered 2020-07-29: 4 mg via INTRAVENOUS

## 2020-07-29 MED ORDER — MEPERIDINE HCL 50 MG/ML IJ SOLN
INTRAMUSCULAR | Status: AC
  Filled 2020-07-29: qty 1

## 2020-07-29 MED ORDER — LACTATED RINGERS IV SOLN: INTRAVENOUS | Status: DC

## 2020-07-29 MED ORDER — GLYCOPYRROLATE 0.2 MG/ML IJ SOLN
INTRAMUSCULAR | Status: DC | PRN
  Administered 2020-07-29: .2 mg via INTRAVENOUS

## 2020-07-29 MED ORDER — LIDOCAINE HCL (CARDIAC) PF 100 MG/5ML IV SOSY
PREFILLED_SYRINGE | INTRAVENOUS | Status: DC | PRN
  Administered 2020-07-29: 50 mg via INTRAVENOUS

## 2020-07-29 MED ORDER — CHLORHEXIDINE GLUCONATE 0.12 % MT SOLN
OROMUCOSAL | Status: AC
  Administered 2020-07-29: 15 mL via OROMUCOSAL
  Filled 2020-07-29: qty 15

## 2020-07-29 MED ORDER — OXYCODONE HCL 5 MG PO TABS
ORAL_TABLET | ORAL | Status: AC
  Administered 2020-07-29: 5 mg via ORAL
  Filled 2020-07-29: qty 1

## 2020-07-29 MED ORDER — FENTANYL CITRATE (PF) 100 MCG/2ML IJ SOLN
INTRAMUSCULAR | Status: AC
  Filled 2020-07-29: qty 2

## 2020-07-29 MED ORDER — OXYCODONE HCL 5 MG PO TABS: 5.0000 mg | ORAL_TABLET | Freq: Once | ORAL | Status: AC | PRN

## 2020-07-29 MED ORDER — MIDAZOLAM HCL 2 MG/2ML IJ SOLN
INTRAMUSCULAR | Status: DC | PRN
  Administered 2020-07-29: 2 mg via INTRAVENOUS

## 2020-07-29 MED ORDER — FAMOTIDINE 20 MG PO TABS
ORAL_TABLET | ORAL | Status: AC
  Administered 2020-07-29: 20 mg via ORAL
  Filled 2020-07-29: qty 1

## 2020-07-29 MED ORDER — FENTANYL CITRATE (PF) 100 MCG/2ML IJ SOLN
INTRAMUSCULAR | Status: DC | PRN
Start: 2020-07-29 — End: 2020-07-29
  Administered 2020-07-29 (×2): 50 ug via INTRAVENOUS

## 2020-07-29 MED ORDER — OXYCODONE HCL 5 MG PO TABS: 5.0000 mg | ORAL_TABLET | ORAL | 0 refills | Status: DC | PRN

## 2020-07-29 MED ORDER — ONDANSETRON HCL 4 MG/2ML IJ SOLN
INTRAMUSCULAR | Status: AC
  Filled 2020-07-29: qty 2

## 2020-07-29 MED ORDER — GLYCOPYRROLATE 0.2 MG/ML IJ SOLN
INTRAMUSCULAR | Status: AC
  Filled 2020-07-29: qty 1

## 2020-07-29 MED ORDER — LIDOCAINE HCL (PF) 2 % IJ SOLN
INTRAMUSCULAR | Status: AC
  Filled 2020-07-29: qty 5

## 2020-07-29 MED ORDER — LIDOCAINE-EPINEPHRINE 1 %-1:100000 IJ SOLN
INTRAMUSCULAR | Status: AC
  Filled 2020-07-29: qty 1

## 2020-07-29 MED ORDER — PROPOFOL 10 MG/ML IV BOLUS
INTRAVENOUS | Status: DC | PRN
  Administered 2020-07-29: 150 mg via INTRAVENOUS
  Administered 2020-07-29 (×2): 50 mg via INTRAVENOUS

## 2020-07-29 MED ORDER — MEPERIDINE HCL 50 MG/ML IJ SOLN
6.2500 mg | INTRAMUSCULAR | Status: DC | PRN
  Administered 2020-07-29: 12.5 mg via INTRAVENOUS

## 2020-07-29 MED ORDER — PHENYLEPHRINE HCL (PRESSORS) 10 MG/ML IV SOLN
INTRAVENOUS | Status: DC | PRN
  Administered 2020-07-29 (×2): 100 ug via INTRAVENOUS

## 2020-07-29 MED ORDER — KETOROLAC TROMETHAMINE 30 MG/ML IJ SOLN
INTRAMUSCULAR | Status: AC
  Filled 2020-07-29: qty 1

## 2020-07-29 MED ORDER — MIDAZOLAM HCL 2 MG/2ML IJ SOLN
INTRAMUSCULAR | Status: AC
  Filled 2020-07-29: qty 2

## 2020-07-29 SURGICAL SUPPLY — 40 items
BLADE SURG 15 STRL LF DISP TIS (BLADE) ×1 IMPLANT
BLADE SURG 15 STRL SS (BLADE) ×2
BLADE SURG SZ10 CARB STEEL (BLADE) ×2 IMPLANT
CANISTER SUCT 1200ML W/VALVE (MISCELLANEOUS) ×2 IMPLANT
CATH ROBINSON RED A/P 16FR (CATHETERS) ×2 IMPLANT
CNTNR SPEC 2.5X3XGRAD LEK (MISCELLANEOUS) ×1
CONT SPEC 4OZ STER OR WHT (MISCELLANEOUS) ×1
CONT SPEC 4OZ STRL OR WHT (MISCELLANEOUS) ×1
CONTAINER SPEC 2.5X3XGRAD LEK (MISCELLANEOUS) ×1 IMPLANT
COVER WAND RF STERILE (DRAPES) ×2 IMPLANT
DRAPE PERI LITHO V/GYN (MISCELLANEOUS) ×2 IMPLANT
DRAPE UNDER BUTTOCK W/FLU (DRAPES) ×2 IMPLANT
DRSG TELFA 3X8 NADH (GAUZE/BANDAGES/DRESSINGS) ×2 IMPLANT
ELECT CAUTERY BLADE 6.4 (BLADE) ×2 IMPLANT
ELECT CAUTERY NEEDLE TIP 1.0 (MISCELLANEOUS) ×2
ELECT REM PT RETURN 9FT ADLT (ELECTROSURGICAL) ×2
ELECTRODE CAUTERY NEDL TIP 1.0 (MISCELLANEOUS) ×1 IMPLANT
ELECTRODE REM PT RTRN 9FT ADLT (ELECTROSURGICAL) ×1 IMPLANT
GAUZE 4X4 16PLY RFD (DISPOSABLE) ×2 IMPLANT
GLOVE BIO SURGEON STRL SZ8 (GLOVE) ×8 IMPLANT
GLOVE INDICATOR 8.0 STRL GRN (GLOVE) ×8 IMPLANT
GOWN STRL REUS W/ TWL LRG LVL3 (GOWN DISPOSABLE) ×1 IMPLANT
GOWN STRL REUS W/ TWL XL LVL3 (GOWN DISPOSABLE) ×1 IMPLANT
GOWN STRL REUS W/TWL LRG LVL3 (GOWN DISPOSABLE) ×2
GOWN STRL REUS W/TWL XL LVL3 (GOWN DISPOSABLE) ×2
NEEDLE HYPO 22GX1.5 SAFETY (NEEDLE) ×2 IMPLANT
NS IRRIG 500ML POUR BTL (IV SOLUTION) ×2 IMPLANT
PACK BASIN MINOR (MISCELLANEOUS) ×2 IMPLANT
PAD OB MATERNITY 4.3X12.25 (PERSONAL CARE ITEMS) ×2 IMPLANT
PAD PREP 24X41 OB/GYN DISP (PERSONAL CARE ITEMS) ×2 IMPLANT
SOL PREP PVP 2OZ (MISCELLANEOUS) ×2
SOLUTION PREP PVP 2OZ (MISCELLANEOUS) ×1 IMPLANT
SUT MNCRL 4-0 (SUTURE) ×2
SUT MNCRL 4-0 27XMFL (SUTURE) ×1
SUT VIC AB 2-0 SH 27 (SUTURE) ×2
SUT VIC AB 2-0 SH 27XBRD (SUTURE) ×1 IMPLANT
SUT VIC AB 3-0 SH 27 (SUTURE) ×4
SUT VIC AB 3-0 SH 27X BRD (SUTURE) ×2 IMPLANT
SUTURE MNCRL 4-0 27XMF (SUTURE) ×1 IMPLANT
SYR CONTROL 10ML LL (SYRINGE) ×2 IMPLANT

## 2020-07-29 NOTE — Transfer of Care (Signed)
Immediate Anesthesia Transfer of Care Note  Patient: Monica Gonzalez  Procedure(s) Performed: WIDE EXCISION VULVECTOMY (N/A )  Patient Location: PACU  Anesthesia Type:General  Level of Consciousness: drowsy  Airway & Oxygen Therapy: Patient Spontanous Breathing and Patient connected to face mask oxygen  Post-op Assessment: Report given to RN  Post vital signs: stable  Last Vitals:  Vitals Value Taken Time  BP 105/62 07/29/20 1331  Temp 36.2 C 07/29/20 1331  Pulse 84 07/29/20 1334  Resp 11 07/29/20 1334  SpO2 100 % 07/29/20 1334  Vitals shown include unvalidated device data.  Last Pain:  Vitals:   07/29/20 1127  PainSc: 0-No pain         Complications: No complications documented.

## 2020-07-29 NOTE — Anesthesia Postprocedure Evaluation (Signed)
Anesthesia Post Note  Patient: Monica Gonzalez  Procedure(s) Performed: WIDE EXCISION VULVECTOMY (N/A Vulva)  Patient location during evaluation: PACU Anesthesia Type: General Level of consciousness: awake and alert and oriented Pain management: pain level controlled Vital Signs Assessment: post-procedure vital signs reviewed and stable Respiratory status: spontaneous breathing, nonlabored ventilation and respiratory function stable Cardiovascular status: blood pressure returned to baseline and stable Postop Assessment: no signs of nausea or vomiting Anesthetic complications: no   No complications documented.   Last Vitals:  Vitals:   07/29/20 1400 07/29/20 1416  BP: 99/62 99/63  Pulse: 86 79  Resp: 15 14  Temp:    SpO2: 98% 100%    Last Pain:  Vitals:   07/29/20 1416  PainSc: 5                  Knox Cervi

## 2020-07-29 NOTE — Op Note (Signed)
    Operative Note   07/29/2020 1:28 PM  PRE-OP DIAGNOSIS: VIN III    POST-OP DIAGNOSIS: same  SURGEON:     Leida Lauth, MD - Primary  ANESTHESIA: General ET  PROCEDURE: WIDE EXCISION VULVECTOMY   ESTIMATED BLOOD LOSS: 25 ml  TOTAL IV FLUIDS: per anesthesia  SPECIMENS: vulva wide local excision of perineum with stitch at 12 o'clock  COMPLICATIONS: none  DISPOSITION: PACU - hemodynamically stable.  CONDITION: stable  INDICATIONS: VIN3 on vulvar biopsy with ulceration  FINDINGS: small area of ulceration and white epithelium from 5-7 o'clock at posterior vaginal introitus measuring about 3 x 2 cm.  PROCEDURE IN DETAIL: After induction of general anesthesia with endotracheal intubation the patient was prepped and draped in lithotomy position.  Bladder was emptied with straight cath.  The lesion was removed with a few mm margin with scissors and bovie cautery and the specimen marked with a suture at 12 o'clock.  A deep layer of sutures was placed with 2-0 Vicryl to reappropimate the tissues.  A second layer of subdermal sutures was placed with 3-0 Vicryl to reapproximate the skin and then the skin was closed with a running subcuticular suture of 4-0 monocryl.    Counts were correct.  And hemostatsis was excellent.  Patient was awakened and taken to the recovery room in good condition.    No VTE or antibiotic prophylaxis indicated.    Leida Lauth, MD

## 2020-07-29 NOTE — Anesthesia Procedure Notes (Signed)
Procedure Name: LMA Insertion Date/Time: 07/29/2020 12:32 PM Performed by: Jaye Beagle, CRNA Pre-anesthesia Checklist: Patient identified, Emergency Drugs available, Suction available and Patient being monitored Patient Re-evaluated:Patient Re-evaluated prior to induction Oxygen Delivery Method: Circle system utilized Preoxygenation: Pre-oxygenation with 100% oxygen Induction Type: IV induction LMA: LMA inserted LMA Size: 3.5 Number of attempts: 1 Placement Confirmation: positive ETCO2 and breath sounds checked- equal and bilateral Tube secured with: Tape Dental Injury: Teeth and Oropharynx as per pre-operative assessment

## 2020-07-29 NOTE — Anesthesia Preprocedure Evaluation (Addendum)
Anesthesia Evaluation  Patient identified by MRN, date of birth, ID band Patient awake    Reviewed: Allergy & Precautions, NPO status , Patient's Chart, lab work & pertinent test results  History of Anesthesia Complications Negative for: history of anesthetic complications  Airway Mallampati: II  TM Distance: >3 FB Neck ROM: Full    Dental  (+) Poor Dentition, Missing,    Pulmonary asthma (mild intermittent) , neg sleep apnea,    breath sounds clear to auscultation- rhonchi (-) wheezing      Cardiovascular Exercise Tolerance: Good (-) hypertension(-) CAD, (-) Past MI, (-) Cardiac Stents and (-) CABG  Rhythm:Regular Rate:Normal - Systolic murmurs and - Diastolic murmurs    Neuro/Psych neg Seizures negative neurological ROS  negative psych ROS   GI/Hepatic Neg liver ROS, hiatal hernia, GERD  ,  Endo/Other  negative endocrine ROSneg diabetes  Renal/GU negative Renal ROS     Musculoskeletal negative musculoskeletal ROS (+)   Abdominal   Peds  Hematology  (+) anemia ,   Anesthesia Other Findings Past Medical History: No date: Anemia No date: Asthma No date: GERD (gastroesophageal reflux disease) No date: Heart murmur No date: History of hiatal hernia No date: Lupus (HCC)   Reproductive/Obstetrics                             Anesthesia Physical Anesthesia Plan  ASA: II  Anesthesia Plan: General   Post-op Pain Management:    Induction: Intravenous  PONV Risk Score and Plan: 2 and Ondansetron and Midazolam  Airway Management Planned: Oral ETT  Additional Equipment:   Intra-op Plan:   Post-operative Plan: Extubation in OR  Informed Consent: I have reviewed the patients History and Physical, chart, labs and discussed the procedure including the risks, benefits and alternatives for the proposed anesthesia with the patient or authorized representative who has indicated his/her  understanding and acceptance.     Dental advisory given  Plan Discussed with: CRNA and Anesthesiologist  Anesthesia Plan Comments:         Anesthesia Quick Evaluation

## 2020-07-29 NOTE — Discharge Instructions (Signed)
AMBULATORY SURGERY  DISCHARGE INSTRUCTIONS   1) The drugs that you were given will stay in your system until tomorrow so for the next 24 hours you should not:  A) Drive an automobile B) Make any legal decisions C) Drink any alcoholic beverage   2) You may resume regular meals tomorrow.  Today it is better to start with liquids and gradually work up to solid foods.  You may eat anything you prefer, but it is better to start with liquids, then soup and crackers, and gradually work up to solid foods.   3) Please notify your doctor immediately if you have any unusual bleeding, trouble breathing, redness and pain at the surgery site, drainage, fever, or pain not relieved by medication.    4) Additional Instructions:  Call Dr Johnnette Litter if you have a fever, excessive pain, excessive bleeding or drainage from your incision.  Take ibuprofen for pain, take oxycodone for severe pain only.      Please contact your physician with any problems or Same Day Surgery at [phone], Monday through Friday 6 am to 4 pm, or [place] at [facility] number at [phone].

## 2020-07-29 NOTE — Interval H&P Note (Signed)
History and Physical Interval Note:  07/29/2020 11:55 AM  Mathis Dad Bellemare  has presented today for surgery, with the diagnosis of VIN III.  The various methods of treatment have been discussed with the patient and family. After consideration of risks, benefits and other options for treatment, the patient has consented to  Procedure(s): WIDE EXCISION VULVECTOMY (N/A) as a surgical intervention.  The patient's history has been reviewed, patient examined, no change in status, stable for surgery.  I have reviewed the patient's chart and labs.  Questions were answered to the patient's satisfaction.     Leida Lauth

## 2020-07-29 NOTE — Progress Notes (Signed)
Prescription sent on behalf of Dr. Johnnette Litter for patient post WLE. Precautions were provided by Dr. Johnnette Litter including not to drive and to avoid constipation.

## 2020-07-30 ENCOUNTER — Encounter: Payer: Self-pay | Admitting: Obstetrics and Gynecology

## 2020-08-03 LAB — SURGICAL PATHOLOGY

## 2020-08-06 ENCOUNTER — Inpatient Hospital Stay: Admission: RE | Admit: 2020-08-06 | Payer: No Typology Code available for payment source | Source: Ambulatory Visit

## 2020-08-19 ENCOUNTER — Inpatient Hospital Stay: Payer: 59 | Attending: Obstetrics and Gynecology | Admitting: Obstetrics and Gynecology

## 2020-08-19 ENCOUNTER — Other Ambulatory Visit: Payer: Self-pay

## 2020-08-19 VITALS — BP 102/66 | HR 64 | Temp 98.0°F | Resp 20 | Wt 182.6 lb

## 2020-08-19 DIAGNOSIS — D071 Carcinoma in situ of vulva: Secondary | ICD-10-CM

## 2020-08-19 NOTE — Progress Notes (Signed)
Gynecologic Oncology Consult Visit   Referring Provider: Dr Leeroy Bock Ward  Chief Complaint: VIN3  Subjective:  Monica Gonzalez is a 44 y.o. female who is seen in consultation from Dr. Sampson Goon for VIN III   9/15 underwent WLE of posterior vulva and final path showed VIN3 with no invasion and negative margins.  Here for post op check. No complaints.  Gyn History She was referred to Dr. Elesa Massed from Dr. Sampson Goon for small indurated cystic structure of lower vulva x 2 weeks, painful and raw. On exam, Dr. Elesa Massed noted 1.5 x 1.5 cm superficially ulcerated area extending past labia minor to labia majora in posterior foreschette. Also, on left, area of induration where inferior apex of labia minor ends. No enlarged lymph nodes.   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  No history of vulvar warts or other pathology.  Concern for induration under the lesion so decided to excise in OR.   Problem List: Patient Active Problem List   Diagnosis Date Noted  . 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 Anemia   . Asthma   . GERD (gastroesophageal reflux disease)   . Heart murmur   . History of hiatal hernia   . Lupus [facility])     Past Surgical History: Past Surgical History:  Procedure Laterality Date  . TONSILLECTOMY    . TUBAL LIGATION    . VULVECTOMY N/A 07/29/2020   Procedure: WIDE EXCISION VULVECTOMY;  Surgeon: Leida Lauth, MD;  Location: [facility] ORS;  Service: Gynecology;  Laterality: N/A;    OB History:  OB History  Gravida Para Term Preterm AB Living  5       2 3   SAB TAB Ectopic Multiple Live Births  1            # Outcome Date GA Lbr Len/2nd Weight Sex Delivery Anes PTL Lv  5 Gravida           4 Gravida           3 Gravida           2 AB           1 SAB             Family History: Family History   Problem Relation Age of Onset  . Diabetes Mother   . Congestive Heart Failure Mother     Social History: Social History   Socioeconomic History  . Marital status: Legally Separated    Spouse name: Not on file  . Number of children: Not on file  . Years of education: Not on file  . Highest education level: Not on file  Occupational History  . Not on file  Tobacco Use  . Smoking status: Never Smoker  . Smokeless tobacco: Never Used  Vaping Use  . Vaping Use: Never used  Substance and Sexual Activity  . Alcohol use: Yes  . Drug use: No  . Sexual activity: Yes  Other Topics Concern  . Not on file  Social History Narrative  . Not on file   Social Determinants of Health   Financial Resource Strain:   . Difficulty of Paying Living Expenses: Not on file  Food Insecurity:   . Worried About in the Last Year: Not on file  . Ran Out of Food  in the Last Year: Not on file  Transportation Needs:   . Lack of Transportation (Medical): Not on file  . Lack of Transportation (Non-Medical): Not on file  Physical Activity:   . Days of Exercise per Week: Not on file  . Minutes of Exercise per Session: Not on file  Stress:   . Feeling of Stress : Not on file  Social Connections:   . Frequency of Communication with Friends and Family: Not on file  . Frequency of Social Gatherings with Friends and Family: Not on file  . Attends Religious Services: Not on file  . Active Member of Clubs or Organizations: Not on file  . Attends Banker Meetings: Not on file  . Marital Status: Not on file  Intimate Partner Violence:   . Fear of Current or Ex-Partner: Not on file  . Emotionally Abused: Not on file  . Physically Abused: Not on file  . Sexually Abused: Not on file    Allergies: Allergies  Allergen Reactions  . Prednisone Swelling    Current Medications: Current Outpatient Medications  Medication Sig Dispense Refill  . albuterol (PROVENTIL HFA) 108  (90 Base) MCG/ACT inhaler Inhale 2 puffs into the lungs every 4 (four) hours as needed for wheezing or shortness of breath. 1 Inhaler 1  . Ascorbic Acid (VITAMIN C) 500 MG CAPS Take 1 tablet by mouth daily.    . Baclofen 5 MG TABS Take 5 mg by mouth 3 (three) times daily as needed. 15 tablet 0  . ferrous sulfate 325 (65 FE) MG tablet Take 325 mg by mouth daily.     Marland Kitchen ibuprofen (ADVIL) 600 MG tablet Take 1 tablet (600 mg total) by mouth every 6 (six) hours as needed. (Patient taking differently: Take 600 mg by mouth every 6 (six) hours as needed (PAIN.). ) 30 tablet 0  . oxyCODONE (OXY IR/ROXICODONE) 5 MG immediate release tablet Take 1 tablet (5 mg total) by mouth every 4 (four) hours as needed (postoperative pain unrelieved by tylenol). 10 tablet 0   No current facility-administered medications for this visit.   ROS General: negative for fevers, changes in weight or night sweats.     Objective:  Physical Examination:  BP 102/66   Pulse 64   Temp 98 F (36.7 C)   Resp 20   Wt 182 lb 9.6 oz (82.8 kg)   SpO2 100%   BMI 36.88 kg/m     ECOG Performance Status: 0 - Asymptomatic  GENERAL: Patient is a well appearing female in no acute distress HEENT:  PERRL, neck supple with midline trachea. Thyroid without masses.  NODES:  No cervical, supraclavicular, axillary, or inguinal lymphadenopathy palpated.  LUNGS:  Clear to auscultation bilaterally.  No wheezes or rhonchi. HEART:  Regular rate and rhythm. No murmur appreciated. ABDOMEN:  Soft, nontender.  Positive, normoactive bowel sounds.  MSK:  No focal spinal tenderness to palpation. Full range of motion bilaterally in the upper extremities. EXTREMITIES:  No peripheral edema.   SKIN:  Clear with no obvious rashes or skin changes. No nail dyscrasia. NEURO:  Nonfocal. Well oriented.  Appropriate affect.  Pelvic: Exam chaperoned by nursing EGBUS: Surgical site at 5:30 near introitus well healed.  Cervix: no lesions, nontender,  mobile Vagina: no lesions, no discharge or bleeding Uterus: normal size, nontender, mobile Adnexa: no palpable masses Rectovaginal: confirmatory  Assessment:  Monica Gonzalez is a 44 y.o. P56 female diagnosed with vulvar intraepithelial neoplasia (VIN3) s/p WLE with no invasive cancer  and negative margins 9/21. Normal post op exam today.  Medical co-morbidities complicating care: Asthma, Lupus.  Plan:   Problem List Items Addressed This Visit      Genitourinary   VIN III (vulvar intraepithelial neoplasia III) - Primary     Can follow up with Dr Elesa Massed, and we can see her back in the future should the need arise.   Leida Lauth, MD  CC:  Mick Sell, MD [address] [place],  [place] [address] [phone]

## 2020-08-19 NOTE — Patient Instructions (Signed)
Please follow up with Dr. Elesa Massed in approximately 6 months (April 2022) for follow up exam.

## 2020-12-08 ENCOUNTER — Emergency Department
Admission: EM | Admit: 2020-12-08 | Discharge: 2020-12-08 | Disposition: A | Discharge status: Home / Self Care | Payer: 59 | Attending: Emergency Medicine | Admitting: Emergency Medicine

## 2020-12-08 ENCOUNTER — Other Ambulatory Visit: Payer: Self-pay

## 2020-12-08 ENCOUNTER — Emergency Department: Payer: 59

## 2020-12-08 DIAGNOSIS — U071 COVID-19: Secondary | ICD-10-CM | POA: Insufficient documentation

## 2020-12-08 DIAGNOSIS — J45909 Unspecified asthma, uncomplicated: Secondary | ICD-10-CM | POA: Diagnosis not present

## 2020-12-08 DIAGNOSIS — R0602 Shortness of breath: Secondary | ICD-10-CM | POA: Diagnosis present

## 2020-12-08 LAB — CBC
HCT: 43.8 % (ref 36.0–46.0)
Hemoglobin: 13.8 g/dL (ref 12.0–15.0)
MCH: 27.4 pg (ref 26.0–34.0)
MCHC: 31.5 g/dL (ref 30.0–36.0)
MCV: 86.9 fL (ref 80.0–100.0)
Platelets: 389 10*3/uL (ref 150–400)
RBC: 5.04 MIL/uL (ref 3.87–5.11)
RDW: 15.1 % (ref 11.5–15.5)
WBC: 6.8 10*3/uL (ref 4.0–10.5)
nRBC: 0 % (ref 0.0–0.2)

## 2020-12-08 LAB — BASIC METABOLIC PANEL
Anion gap: 10 (ref 5–15)
BUN: 6 mg/dL (ref 6–20)
CO2: 25 mmol/L (ref 22–32)
Calcium: 8.7 mg/dL — ABNORMAL LOW (ref 8.9–10.3)
Chloride: 105 mmol/L (ref 98–111)
Creatinine, Ser: 0.9 mg/dL (ref 0.44–1.00)
GFR, Estimated: >60 mL/min (ref >60)
Glucose, Bld: 87 mg/dL (ref 70–99)
Potassium: 3.7 mmol/L (ref 3.5–5.1)
Sodium: 140 mmol/L (ref 135–145)

## 2020-12-08 MED ORDER — ACETAMINOPHEN 500 MG PO TABS
1000.0000 mg | ORAL_TABLET | Freq: Once | ORAL | Status: AC
  Administered 2020-12-08: 1000 mg via ORAL
  Filled 2020-12-08: qty 2

## 2020-12-08 MED ORDER — DEXAMETHASONE SODIUM PHOSPHATE 10 MG/ML IJ SOLN
8.0000 mg | Freq: Once | INTRAMUSCULAR | Status: AC
  Administered 2020-12-08: 8 mg via INTRAMUSCULAR
  Filled 2020-12-08: qty 1

## 2020-12-08 MED ORDER — GUAIFENESIN-CODEINE 100-10 MG/5ML PO SYRP: 10.0000 mL | ORAL_SOLUTION | Freq: Three times a day (TID) | ORAL | 0 refills | Status: DC | PRN

## 2020-12-08 NOTE — ED Triage Notes (Signed)
Pt comes with c/o increased SOB and tested positive for COVID today. Pt states she has been sick since Friday. Pt states no relief with inhaler. Pt states hx of asthma. Pt states dry cough and pain in left upper rib area

## 2020-12-09 NOTE — ED Provider Notes (Signed)
[facility] Emergency Department Provider Note  ____________________________________________  Time seen: Approximately 12:07 AM  I have reviewed the triage vital signs and the nursing notes.   HISTORY  Chief Complaint COVID+ and Shortness of Breath   HPI Monica Gonzalez is a 45 y.o. female who presents to the emergency department for treatment and evaluation of symptoms associated with COVID. She was diagnosed today. No relief with her albuterol. Symptoms started Friday. Rib pain with cough.   Past Medical History:  Diagnosis Date  . Anemia   . Asthma   . GERD (gastroesophageal reflux disease)   . Heart murmur   . History of hiatal hernia   . Lupus [facility])     Patient Active Problem List   Diagnosis Date Noted  . 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 TONSILLECTOMY    . TUBAL LIGATION    . VULVECTOMY N/A 07/29/2020   Procedure: WIDE EXCISION VULVECTOMY;  Surgeon: Leida Lauth, MD;  Location: [facility] ORS;  Service: Gynecology;  Laterality: N/A;    Prior to Admission medications   Medication Sig Start Date End Date Taking? Authorizing Provider  guaiFENesin-codeine (ROBITUSSIN AC) 100-10 MG/5ML syrup Take 10 mLs by mouth 3 (three) times daily as needed for cough. 12/08/20  Yes Audrina Marten B, FNP  albuterol (PROVENTIL HFA) 108 (90 Base) MCG/ACT inhaler Inhale 2 puffs into the lungs every 4 (four) hours as needed for wheezing or shortness of breath. 03/10/19   Sharman Cheek, MD  Ascorbic Acid (VITAMIN C) 500 MG CAPS Take 1 tablet by mouth daily.    [provider]  Baclofen 5 MG TABS Take 5 mg by mouth 3 (three) times daily as needed. 06/16/20   Enid Derry, PA-C  ferrous sulfate 325 (65 FE) MG tablet Take 325 mg by mouth daily.  07/01/20 07/01/21  [provider]  ibuprofen (ADVIL) 600 MG tablet Take 1 tablet (600 mg total) by mouth every 6 (six) hours as  needed. Patient taking differently: Take 600 mg by mouth every 6 (six) hours as needed (PAIN.).  06/16/20   Enid Derry, PA-C  oxyCODONE (OXY IR/ROXICODONE) 5 MG immediate release tablet Take 1 tablet (5 mg total) by mouth every 4 (four) hours as needed (postoperative pain unrelieved by tylenol). 07/29/20   Alinda Dooms, NP    Allergies Prednisone  Family History  Problem Relation Age of Onset  . Diabetes Mother   . Congestive Heart Failure Mother     Social History Social History   Tobacco Use  . Smoking status: Never Smoker  . Smokeless tobacco: Never Used  Vaping Use  . Vaping Use: Never used  Substance Use Topics  . Alcohol use: Yes  . Drug use: No    Review of Systems Constitutional: Negative for fever/chills. Decreased appetite. ENT: Negative for sore throat. Cardiovascular: Denies chest pain. Respiratory: Negative for shortness of breath. Positive for cough. Negative wheezing.  Gastrointestinal: Negative for nausea,  no vomiting.  no diarrhea.  Musculoskeletal: Positive for body aches Skin: Negative for rash. Neurological: Negative for headaches ____________________________________________   PHYSICAL EXAM:  VITAL SIGNS: ED Triage Vitals  Enc Vitals Group     BP 12/08/20 1630 126/71     Pulse Rate 12/08/20 1630 80     Resp 12/08/20 1630 18     Temp 12/08/20 1630 98.4 F (36.9 C)     Temp src --  SpO2 12/08/20 1630 100 %     Weight 12/08/20 1631 180 lb (81.6 kg)     Height 12/08/20 1631 4\' 11"  (1.499 m)     Head Circumference --      Peak Flow --      Pain Score 12/08/20 1631 5     Pain Loc --      Pain Edu? --      Excl. in GC? --     Constitutional: Alert and oriented. Overall well appearing and in no acute distress. Eyes: Conjunctivae are normal. Ears: Bilateral TM normal Nose: No sinus congestion noted; no rhinnorhea. Mouth/Throat: Mucous membranes are moist.  Oropharynx normal. Tonsils normal. Uvula midline. Neck: No stridor.   Lymphatic: No cervical lymphadenopathy. Cardiovascular: Normal rate, regular rhythm. Good peripheral circulation. Respiratory: Respirations are even and unlabored.  No retractions. Breath sounds clear to auscultation. Gastrointestinal: Soft and nontender.  Musculoskeletal: FROM x 4 extremities.  Neurologic:  Normal speech and language. Skin:  Skin is warm, dry and intact. No rash noted. Psychiatric: Mood and affect are normal. Speech and behavior are normal.  ____________________________________________   LABS (all labs ordered are listed, but only abnormal results are displayed)  Labs Reviewed  BASIC METABOLIC PANEL - Abnormal; Notable for the following components:      Result Value   Calcium 8.7 (*)    All other components within normal limits  CBC   ____________________________________________  EKG  Not indicated. ____________________________________________  RADIOLOGY  Not indicated. ____________________________________________   PROCEDURES  Procedure(s) performed: None  Critical Care performed: No ____________________________________________   INITIAL IMPRESSION / ASSESSMENT AND PLAN / ED COURSE  45 y.o. female who presents to the ER for symptoms as listed in HPI. Plan was initially to give Prednisone, but she states that it causes swelling. She has had Decadron injection in the past without adverse effects, so that will be given prior to discharge. She is to follow up with primary care or return to the ER for symptoms of concern.    Medications  acetaminophen (TYLENOL) tablet 1,000 mg (1,000 mg Oral Given 12/08/20 1900)  dexamethasone (DECADRON) injection 8 mg (8 mg Intramuscular Given 12/08/20 1859)    ED Discharge Orders         Ordered    guaiFENesin-codeine (ROBITUSSIN AC) 100-10 MG/5ML syrup  3 times daily PRN        12/08/20 1852           Pertinent labs & imaging results that were available during my care of the patient were reviewed by me  and considered in my medical decision making (see chart for details).    If controlled substance prescribed during this visit, 12 month history viewed on the [facility] prior to issuing an initial prescription for Schedule II or III opiod. ____________________________________________   FINAL CLINICAL IMPRESSION(S) / ED DIAGNOSES  Final diagnoses:  COVID    Note:  This document was prepared using Dragon voice recognition software and may include unintentional dictation errors.    12/10/20, FNP 12/12/20 1620    12/14/20, MD 12/18/20 [phone]

## 2021-03-21 ENCOUNTER — Other Ambulatory Visit: Payer: Self-pay

## 2021-03-21 ENCOUNTER — Emergency Department: Payer: 59

## 2021-03-21 ENCOUNTER — Emergency Department
Admission: EM | Admit: 2021-03-21 | Discharge: 2021-03-21 | Disposition: A | Discharge status: Home / Self Care | Payer: 59 | Attending: Emergency Medicine | Admitting: Emergency Medicine

## 2021-03-21 DIAGNOSIS — J45909 Unspecified asthma, uncomplicated: Secondary | ICD-10-CM | POA: Insufficient documentation

## 2021-03-21 DIAGNOSIS — M546 Pain in thoracic spine: Secondary | ICD-10-CM | POA: Insufficient documentation

## 2021-03-21 DIAGNOSIS — Z8616 Personal history of COVID-19: Secondary | ICD-10-CM | POA: Insufficient documentation

## 2021-03-21 MED ORDER — CYCLOBENZAPRINE HCL 10 MG PO TABS: 10.0000 mg | ORAL_TABLET | Freq: Three times a day (TID) | ORAL | 0 refills | Status: DC | PRN

## 2021-03-21 MED ORDER — OXYCODONE-ACETAMINOPHEN 5-325 MG PO TABS: 1.0000 | ORAL_TABLET | Freq: Four times a day (QID) | ORAL | 0 refills | Status: AC | PRN

## 2021-03-21 MED ORDER — OXYCODONE-ACETAMINOPHEN 5-325 MG PO TABS
1.0000 | ORAL_TABLET | Freq: Once | ORAL | Status: AC
  Administered 2021-03-21: 1 via ORAL
  Filled 2021-03-21: qty 1

## 2021-03-21 NOTE — Discharge Instructions (Signed)
Please follow up with primary care if not improving over the week.  Return to the ER for symptoms that change or worsen if unable to schedule an appointment. 

## 2021-03-21 NOTE — ED Provider Notes (Signed)
[facility] Emergency Department Provider Note ____________________________________________  Time seen: Approximately 2:38 PM  I have reviewed the triage vital signs and the nursing notes.  HISTORY  Chief Complaint Back Pain   HPI Monica Gonzalez is a 45 y.o. female with a history of Lupus, COVID 19 and other as listed below who presents to the emergency department for evaluation of right side back pain.No relief with OTC medications.   Past Medical History:  Diagnosis Date  . Anemia   . Asthma   . GERD (gastroesophageal reflux disease)   . Heart murmur   . History of hiatal hernia   . Lupus [facility])     Patient Active Problem List   Diagnosis Date Noted  . 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 TONSILLECTOMY    . TUBAL LIGATION    . VULVECTOMY N/A 07/29/2020   Procedure: WIDE EXCISION VULVECTOMY;  Surgeon: Leida Lauth, MD;  Location: [facility] ORS;  Service: Gynecology;  Laterality: N/A;    Prior to Admission medications   Medication Sig Start Date End Date Taking? Authorizing Provider  cyclobenzaprine (FLEXERIL) 10 MG tablet Take 1 tablet (10 mg total) by mouth 3 (three) times daily as needed. 03/21/21  Yes Ashdon Gillson B, FNP  oxyCODONE-acetaminophen (PERCOCET) 5-325 MG tablet Take 1 tablet by mouth every 6 (six) hours as needed. 03/21/21 03/21/22 Yes Antrell Tipler B, FNP  albuterol (PROVENTIL HFA) 108 (90 Base) MCG/ACT inhaler Inhale 2 puffs into the lungs every 4 (four) hours as needed for wheezing or shortness of breath. 03/10/19   Sharman Cheek, MD  Ascorbic Acid (VITAMIN C) 500 MG CAPS Take 1 tablet by mouth daily.    [provider]  ferrous sulfate 325 (65 FE) MG tablet Take 325 mg by mouth daily.  07/01/20 07/01/21  [provider]  guaiFENesin-codeine (ROBITUSSIN AC) 100-10 MG/5ML syrup Take 10 mLs by mouth 3 (three) times daily as needed for cough.  12/08/20   Kevontae Burgoon, Rulon Eisenmenger B, FNP  ibuprofen (ADVIL) 600 MG tablet Take 1 tablet (600 mg total) by mouth every 6 (six) hours as needed. Patient taking differently: Take 600 mg by mouth every 6 (six) hours as needed (PAIN.).  06/16/20   Enid Derry, PA-C    Allergies Prednisone  Family History  Problem Relation Age of Onset  . Diabetes Mother   . Congestive Heart Failure Mother     Social History Social History   Tobacco Use  . Smoking status: Never Smoker  . Smokeless tobacco: Never Used  Vaping Use  . Vaping Use: Never used  Substance Use Topics  . Alcohol use: Yes  . Drug use: No    Review of Systems Constitutional: Well appearing. Respiratory: Negative for dyspnea. Cardiovascular: Negative for change in skin temperature or color. Musculoskeletal:   Negative for chronic steroid use   Negative for trauma in the presence of osteoporosis  Negative for age over 59 and trauma.  Negative for constitutional symptoms, or history of cancer  Negative for pain worse at night. Skin: Negative for rash, lesion, or wound.  Genitourinary: Negative for urinary retention. Rectal: Negative for fecal incontinence or new onset constipation/bowel habit changes. Hematological/Immunilogical: Negative for immunosuppression, IV drug use, or fever Neurological: Negative for burning, tingling, numb, electric, radiating pain in the lower extremities.Marland Kitchen  Negative for saddle anesthesia.                        Negative for focal neurologic deficit, progressive or disabling symptoms             Negative for saddle anesthesia. ____________________________________________   PHYSICAL EXAM:  VITAL SIGNS: ED Triage Vitals  Enc Vitals Group     BP 03/21/21 1412 (!) 101/40     Pulse Rate 03/21/21 1412 64     Resp 03/21/21 1412 16     Temp 03/21/21 1412 98 F (36.7 C)     Temp Source 03/21/21 1412 Oral     SpO2 03/21/21 1412 99 %     Weight 03/21/21 1411 180 lb (81.6 kg)      Height 03/21/21 1411 4\' 11"  (1.499 m)     Head Circumference --      Peak Flow --      Pain Score 03/21/21 1410 8     Pain Loc --      Pain Edu? --      Excl. in GC? --     Constitutional: Alert and oriented. Well appearing and in no acute distress. Eyes: Conjunctivae are clear without discharge or drainage.  Head: Atraumatic. Neck: Full, active range of motion. Respiratory: Respirations even and unlabored. Musculoskeletal: Thoracic tenderness overlying posterior ribs on right side. Full ROM of the back and extremities, Strength 5/5 of the lower extremities as tested. Negative Homan's Sign. Neurologic: Reflexes of the lower extremities are 2+. Negative straight leg raise on the right and left side. Skin: Atraumatic. No lower extremity erythema. Psychiatric: Behavior and affect are normal.  ____________________________________________   LABS (all labs ordered are listed, but only abnormal results are displayed)  Labs Reviewed - No data to display ____________________________________________  RADIOLOGY  Chest x-ray negative for acute concerns. ____________________________________________   PROCEDURES  Procedure(s) performed:  Procedures ____________________________________________   INITIAL IMPRESSION / ASSESSMENT AND PLAN / ED COURSE  Monica Gonzalez is a 45 y.o. female who presents to the emergency department for treatment and evaluation of right side upper back pain.  Symptoms have been present for the past 2 to 3 days.  No relief with over-the-counter medications.  Monica denies ever having had similar pain in the past.  Monica did have COVID-19 with pneumonia in January.  Monica has not had a follow-up chest x-ray to ensure that pneumonia has completely resolved.  Initial plan will be to get a chest x-ray.  Chest x-ray negative for acute concerns. Plan will be to treat as musculoskeletal pain. Monica does have lupus and is awaiting a referral to rheumatology. Monica was advised that if  Monica continues to have pain, Monica needs to follow up with primary care. Monica is to return to the ER for symptoms that change or worsen if unable to schedule an appointment.  Medications  oxyCODONE-acetaminophen (PERCOCET/ROXICET) 5-325 MG per tablet 1 tablet (1 tablet Oral Given 03/21/21 1453)    ED Discharge Orders         Ordered    oxyCODONE-acetaminophen (PERCOCET) 5-325 MG tablet  Every 6 hours PRN        03/21/21 1619    cyclobenzaprine (FLEXERIL) 10 MG tablet  3 times daily PRN        03/21/21 1619           Pertinent labs & imaging results that were available during my care of the patient were reviewed by me  and considered in my medical decision making (see chart for details).  _________________________________________   FINAL CLINICAL IMPRESSION(S) / ED DIAGNOSES  Final diagnoses:  Acute right-sided thoracic back pain     If controlled substance prescribed during this visit, 12 month history viewed on the [facility]CCS[facility] prior to issuing an initial prescription for Schedule II or III opiod.   Chinita Pester, FNP 03/21/21 1622    Merwyn Katos, MD 03/22/21 [phone]

## 2021-03-21 NOTE — ED Triage Notes (Signed)
Pt arrives to ER c/o of R sided back pain. Hx spasms. Denies injury. A&O, ambulatory

## 2021-03-30 ENCOUNTER — Other Ambulatory Visit (HOSPITAL_COMMUNITY): Payer: Self-pay | Admitting: Family Medicine

## 2021-03-30 ENCOUNTER — Other Ambulatory Visit: Payer: Self-pay | Admitting: Family Medicine

## 2021-03-30 DIAGNOSIS — M546 Pain in thoracic spine: Secondary | ICD-10-CM

## 2021-04-03 ENCOUNTER — Ambulatory Visit: Payer: 59

## 2021-04-05 ENCOUNTER — Other Ambulatory Visit: Payer: Self-pay | Admitting: Family Medicine

## 2021-04-05 DIAGNOSIS — M546 Pain in thoracic spine: Secondary | ICD-10-CM

## 2021-07-08 ENCOUNTER — Other Ambulatory Visit: Payer: Self-pay | Admitting: Infectious Diseases

## 2021-07-08 DIAGNOSIS — R61 Generalized hyperhidrosis: Secondary | ICD-10-CM

## 2021-07-08 DIAGNOSIS — R1319 Other dysphagia: Secondary | ICD-10-CM

## 2021-07-16 ENCOUNTER — Ambulatory Visit
Admission: RE | Admit: 2021-07-16 | Discharge: 2021-07-16 | Disposition: A | Discharge status: Home / Self Care | Payer: 59 | Source: Ambulatory Visit | Attending: Infectious Diseases | Admitting: Infectious Diseases

## 2021-07-16 ENCOUNTER — Other Ambulatory Visit: Payer: Self-pay

## 2021-07-16 DIAGNOSIS — R61 Generalized hyperhidrosis: Secondary | ICD-10-CM | POA: Diagnosis present

## 2021-07-16 DIAGNOSIS — R1319 Other dysphagia: Secondary | ICD-10-CM

## 2021-09-19 ENCOUNTER — Other Ambulatory Visit: Payer: 59

## 2021-10-30 ENCOUNTER — Other Ambulatory Visit: Payer: Self-pay

## 2021-10-30 ENCOUNTER — Emergency Department
Admission: EM | Admit: 2021-10-30 | Discharge: 2021-10-30 | Disposition: A | Discharge status: Home / Self Care | Payer: Self-pay | Attending: Emergency Medicine | Admitting: Emergency Medicine

## 2021-10-30 ENCOUNTER — Encounter: Payer: Self-pay | Admitting: Emergency Medicine

## 2021-10-30 DIAGNOSIS — Z20822 Contact with and (suspected) exposure to covid-19: Secondary | ICD-10-CM | POA: Insufficient documentation

## 2021-10-30 DIAGNOSIS — J45909 Unspecified asthma, uncomplicated: Secondary | ICD-10-CM | POA: Insufficient documentation

## 2021-10-30 DIAGNOSIS — J069 Acute upper respiratory infection, unspecified: Secondary | ICD-10-CM | POA: Insufficient documentation

## 2021-10-30 LAB — RESP PANEL BY RT-PCR (FLU A&B, COVID) ARPGX2
Influenza A by PCR: NEGATIVE
Influenza B by PCR: NEGATIVE
SARS Coronavirus 2 by RT PCR: NEGATIVE

## 2021-10-30 MED ORDER — PSEUDOEPH-BROMPHEN-DM 30-2-10 MG/5ML PO SYRP: 5.0000 mL | ORAL_SOLUTION | Freq: Four times a day (QID) | ORAL | 0 refills | Status: DC | PRN

## 2021-10-30 NOTE — Discharge Instructions (Signed)
Follow-up with your primary care provider if any continued problems or concerns.  Increase fluids to stay hydrated.  Tylenol or ibuprofen as needed for body aches, headache or fever.  A prescription for Bromfed-DM was sent to your pharmacy to take as needed for congestion and cough.

## 2021-10-30 NOTE — ED Provider Notes (Signed)
[facility] Emergency Department Provider Note  ____________________________________________   Event Date/Time   First MD Initiated Contact with Patient 10/30/21 1034     (approximate)  I have reviewed the triage vital signs and the nursing notes.   HISTORY  Chief Complaint Sore Throat, Chills, and Nasal Congestion   HPI Monica Gonzalez is a 45 y.o. female presents to the ED with complaint of sore throat, cough, chills and congestion that started yesterday.  Patient denies any known fever and is unaware of any known exposure to COVID or influenza.  She denies any nausea, vomiting or diarrhea.  Rates her pain as 6 out of 10.     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 Authorizing Provider  brompheniramine-pseudoephedrine-DM 30-2-10 MG/5ML syrup Take 5 mLs by mouth 4 (four) times daily as needed. 10/30/21  Yes Tommi Rumps, PA-C  albuterol (PROVENTIL HFA) 108 (90 Base) MCG/ACT inhaler Inhale 2 puffs into the lungs every 4 (four) hours as needed for wheezing or shortness of breath. 03/10/19   Sharman Cheek, MD  Ascorbic Acid (VITAMIN C) 500 MG CAPS Take 1 tablet by mouth daily.    [provider]  cyclobenzaprine (FLEXERIL) 10 MG tablet Take 1 tablet (10 mg total) by mouth 3 (three) times daily as needed. 03/21/21   Triplett, Cari B, FNP  ferrous sulfate 325 (65 FE) MG tablet Take  325 mg by mouth daily.  07/01/20 07/01/21  [provider]  guaiFENesin-codeine (ROBITUSSIN AC) 100-10 MG/5ML syrup Take 10 mLs by mouth 3 (three) times daily as needed for cough. 12/08/20   Triplett, Rulon Eisenmenger B, FNP  ibuprofen (ADVIL) 600 MG tablet Take 1 tablet (600 mg total) by mouth every 6 (six) hours as needed. Patient taking differently: Take 600 mg by mouth every 6 (six) hours as needed (PAIN.).  06/16/20   Enid Derry, PA-C  oxyCODONE-acetaminophen (PERCOCET) 5-325 MG tablet Take 1 tablet by mouth every 6 (six) hours as needed. 03/21/21 03/21/22  Chinita Pester, FNP    Allergies Prednisone  Family History  Problem Relation Age of Onset   Diabetes Mother    Congestive Heart Failure Mother     Social History Social History   Tobacco Use   Smoking status: Never   Smokeless tobacco: Never  Vaping Use   Vaping Use: Never used  Substance Use Topics   Alcohol use: Yes   Drug use: No    Review of Systems Constitutional: No fever/positive chills Eyes: No visual changes. ENT: positive sore throat.  Positive nasal congestion. Cardiovascular: Denies chest pain. Respiratory: Denies shortness of breath.  Positive for cough. Gastrointestinal: No abdominal pain.  No nausea, no vomiting.  No diarrhea.   Genitourinary: Negative for dysuria. Musculoskeletal: Positive for body aches. Skin: Negative for rash. Neurological: Negative for headaches, focal weakness or  numbness.  ____________________________________________   PHYSICAL EXAM:  VITAL SIGNS: ED Triage Vitals  Enc Vitals Group     BP 10/30/21 1037 117/76     Pulse Rate 10/30/21 1037 61     Resp 10/30/21 1037 16     Temp 10/30/21 1037 98.7 F (37.1 C)     Temp Source 10/30/21 1037 Oral     SpO2 10/30/21 1037 100 %     Weight 10/30/21 1017 177 lb (80.3 kg)     Height 10/30/21 1017 4\' 11"  (1.499 m)     Head Circumference --      Peak Flow --      Pain Score 10/30/21 1017 6     Pain Loc --      Pain Edu? --       Excl. in GC? --     Constitutional: Alert and oriented. Well appearing and in no acute distress. Eyes: Conjunctivae are normal.  Head: Atraumatic. Nose: Moderate congestion/rhinnorhea. Mouth/Throat: Mucous membranes are moist.  Oropharynx non-erythematous.  No exudate present. Neck: No stridor.  No cervical lymphadenopathy appreciated. Cardiovascular: Normal rate, regular rhythm. Grossly normal heart sounds.  Good peripheral circulation. Respiratory: Normal respiratory effort.  No retractions. Lungs CTAB. Gastrointestinal: Soft and nontender. No distention.  Musculoskeletal: Able move upper and lower extremities without difficulty normal gait was noted. Neurologic:  Normal speech and language. No gross focal neurologic deficits are appreciated. No gait instability. Skin:  Skin is warm, dry and intact. No rash noted. Psychiatric: Mood and affect are normal. Speech and behavior are normal.  ____________________________________________   LABS (all labs ordered are listed, but only abnormal results are displayed)  Labs Reviewed  RESP PANEL BY RT-PCR (FLU A&B, COVID) [id number]   ____________________________________________  PROCEDURES  Procedure(s) performed (including Critical Care):  Procedures   ____________________________________________   INITIAL IMPRESSION / ASSESSMENT AND PLAN / ED COURSE  As part of my medical decision making, I reviewed the following data within the electronic MEDICAL RECORD NUMBER Notes from prior ED visits and [place] Controlled Substance Database  45 year old female presents to the ED with complaint of cough, congestion, sore throat and chills.  Patient states that she is taken the COVID-vaccine and is not aware of any known COVID exposure.  COVID and influenza were negative and patient was made aware.  She will continue with fluids, Tylenol, ibuprofen and a prescription for Bromfed-DM was sent to the pharmacy.  She will follow-up with her PCP if any continued  problems.    ____________________________________________   FINAL CLINICAL IMPRESSION(S) / ED DIAGNOSES  Final diagnoses:  Viral URI with cough     ED Discharge Orders          Ordered    brompheniramine-pseudoephedrine-DM 30-2-10 MG/5ML syrup  4 times daily PRN        10/30/21 1203             Note:  This document was prepared using Dragon voice recognition software and may include unintentional dictation errors.    11/01/21, PA-C 10/30/21 1420    11/01/21, MD 10/30/21 [phone]

## 2021-10-30 NOTE — ED Triage Notes (Signed)
Pt reports yesterday started with sore throat, chills, cough and congestion. Pt denies recent exposures or fevers.

## 2021-11-20 ENCOUNTER — Emergency Department: Payer: PRIVATE HEALTH INSURANCE

## 2021-11-20 ENCOUNTER — Emergency Department
Admission: EM | Admit: 2021-11-20 | Discharge: 2021-11-20 | Disposition: A | Discharge status: Home / Self Care | Payer: PRIVATE HEALTH INSURANCE | Attending: Emergency Medicine | Admitting: Emergency Medicine

## 2021-11-20 ENCOUNTER — Other Ambulatory Visit: Payer: Self-pay

## 2021-11-20 DIAGNOSIS — L02415 Cutaneous abscess of right lower limb: Secondary | ICD-10-CM | POA: Insufficient documentation

## 2021-11-20 DIAGNOSIS — L0390 Cellulitis, unspecified: Secondary | ICD-10-CM

## 2021-11-20 DIAGNOSIS — B999 Unspecified infectious disease: Secondary | ICD-10-CM

## 2021-11-20 DIAGNOSIS — L03115 Cellulitis of right lower limb: Secondary | ICD-10-CM | POA: Insufficient documentation

## 2021-11-20 DIAGNOSIS — M79604 Pain in right leg: Secondary | ICD-10-CM | POA: Diagnosis present

## 2021-11-20 DIAGNOSIS — L03119 Cellulitis of unspecified part of limb: Secondary | ICD-10-CM

## 2021-11-20 LAB — BASIC METABOLIC PANEL
Anion gap: 6 (ref 5–15)
BUN: 8 mg/dL (ref 6–20)
CO2: 26 mmol/L (ref 22–32)
Calcium: 9.5 mg/dL (ref 8.9–10.3)
Chloride: 105 mmol/L (ref 98–111)
Creatinine, Ser: 0.86 mg/dL (ref 0.44–1.00)
GFR, Estimated: >60 mL/min (ref >60)
Glucose, Bld: 94 mg/dL (ref 70–99)
Potassium: 4 mmol/L (ref 3.5–5.1)
Sodium: 137 mmol/L (ref 135–145)

## 2021-11-20 LAB — CBC
HCT: 42.6 % (ref 36.0–46.0)
Hemoglobin: 13.7 g/dL (ref 12.0–15.0)
MCH: 29 pg (ref 26.0–34.0)
MCHC: 32.2 g/dL (ref 30.0–36.0)
MCV: 90.1 fL (ref 80.0–100.0)
Platelets: 341 10*3/uL (ref 150–400)
RBC: 4.73 MIL/uL (ref 3.87–5.11)
RDW: 14.2 % (ref 11.5–15.5)
WBC: 11 10*3/uL — ABNORMAL HIGH (ref 4.0–10.5)
nRBC: 0 % (ref 0.0–0.2)

## 2021-11-20 MED ORDER — ONDANSETRON HCL 4 MG/2ML IJ SOLN
4.0000 mg | Freq: Once | INTRAMUSCULAR | Status: AC
  Administered 2021-11-20: 4 mg via INTRAVENOUS
  Filled 2021-11-20: qty 2

## 2021-11-20 MED ORDER — CLINDAMYCIN HCL 300 MG PO CAPS: 300.0000 mg | ORAL_CAPSULE | Freq: Four times a day (QID) | ORAL | 0 refills | Status: AC

## 2021-11-20 MED ORDER — MORPHINE SULFATE (PF) 4 MG/ML IV SOLN
4.0000 mg | Freq: Once | INTRAVENOUS | Status: AC
  Administered 2021-11-20: 4 mg via INTRAVENOUS
  Filled 2021-11-20: qty 1

## 2021-11-20 MED ORDER — SODIUM CHLORIDE 0.9 % IV BOLUS
1000.0000 mL | Freq: Once | INTRAVENOUS | Status: AC
  Administered 2021-11-20: 1000 mL via INTRAVENOUS

## 2021-11-20 MED ORDER — CLINDAMYCIN PHOSPHATE 900 MG/50ML IV SOLN
900.0000 mg | Freq: Once | INTRAVENOUS | Status: AC
  Administered 2021-11-20: 900 mg via INTRAVENOUS
  Filled 2021-11-20: qty 50

## 2021-11-20 MED ORDER — HYDROCODONE-ACETAMINOPHEN 5-325 MG PO TABS: 1.0000 | ORAL_TABLET | ORAL | 0 refills | Status: DC | PRN

## 2021-11-20 MED ORDER — OXYCODONE-ACETAMINOPHEN 5-325 MG PO TABS
1.0000 | ORAL_TABLET | Freq: Once | ORAL | Status: AC
  Administered 2021-11-20: 1 via ORAL
  Filled 2021-11-20: qty 1

## 2021-11-20 NOTE — ED Provider Notes (Signed)
[facility] Provider Note  Patient Contact: 7:24 PM (approximate)   History   Abscess   HPI  Monica Gonzalez is a 46 y.o. female who presents to the emergency department for evaluation of 2 possible abscesses to the right thigh.  Patient states that she went to bed 1 night, did not see any lesions, woke up with 2 what appeared to be bug bites.  Patient had been placed on doxycycline as these were erythematous and painful by her primary care provider.  The superior lesion appears to be worsening despite antibiotic usage.  No fevers or chills.  Patient is concerned as her mother had a similar finding and required surgical debridement of an abscess.  Again no systemic complaints of fevers, chills, nausea or vomiting.     Physical Exam   Triage Vital Signs: ED Triage Vitals [11/20/21 1709]  Enc Vitals Group     BP      Pulse      Resp      Temp      Temp src      SpO2      Weight      Height      Head Circumference      Peak Flow      Pain Score 10     Pain Loc      Pain Edu?      Excl. in GC?     Most recent vital signs: Vitals:   11/20/21 2018  BP: 132/64  Pulse: 73  Resp: 17  SpO2: 98%     General: Alert and in no acute distress.   Cardiovascular:  Good peripheral perfusion Respiratory: Normal respiratory effort without tachypnea or retractions. Lungs CTAB.  Musculoskeletal: Visualization of the right thigh reveals 2 erythematous lesions, one measures approximately 1.5 cm in diameter.  The other area which is superior measures approximately 8 cm in total diameter there is a central lesion that measures approximately 1-1/2 cm in diameter.  No drainage at this time.  Very tender to palpation.  Area is indurated but not fluctuant. Neurologic:  No gross focal neurologic deficits are appreciated.  Skin:   No rash noted Other:   ED Results / Procedures / Treatments   Labs (all labs ordered are listed, but only abnormal results are  displayed) Labs Reviewed  CBC - Abnormal; Notable for the following components:      Result Value   WBC 11.0 (*)    All other components within normal limits  BASIC METABOLIC PANEL     EKG     RADIOLOGY  I personally viewed and evaluated these images as part of my medical decision making, as well as reviewing the written report by the radiologist.  ED Provider Interpretation: Soft tissue edema noted on x-ray but no evidence of osteomyelitis.  Ultrasound revealed small area of of heterogenous fluid collection measuring 1.7 x 0.7 cm  [place] RT LOWER EXTREM LTD SOFT TISSUE NON VASCULAR  Result Date: 11/20/2021 CLINICAL DATA:  Worsening right 5 cellulitis. Technologist notes state two thigh abscess. EXAM: ULTRASOUND RIGHT LOWER EXTREMITY LIMITED TECHNIQUE: Ultrasound examination of the lower extremity soft tissues was performed in the area of clinical concern. COMPARISON:  None. FINDINGS: Targeted sonographic evaluation labeled area 1 medial superior thigh. Heterogeneous hypoechoic collection in the subcutaneous tissues measures 1.7 x 0.9 x 0.7 cm. There is peripheral vascularity. Targeted sonographic evaluation labeled area 2 medial distal thigh demonstrates a subcentimeter 8 x 4 x 6  mm hypoechoic focus in the skin surface with internal vascularity. No sonographic findings of radiopaque foreign body or soft tissue gas. IMPRESSION: 1. Heterogeneous collection in the medial superior thyroid measuring 1.7 x 0.9 x 0.7 cm consistent with abscess. 2. Small subcentimeter hypoechoic skin lesion in the medial distal thigh may represent an additional abscess. Electronically Signed   By: Narda Rutherford M.D.   On: 11/20/2021 20:18   DG FEMUR, MIN 2 VIEWS RIGHT  Result Date: 11/20/2021 CLINICAL DATA:  Right thigh abscess. EXAM: RIGHT FEMUR 2 VIEWS COMPARISON:  None. FINDINGS: There is no evidence of fracture or other focal bone lesions. Soft tissue swelling and stranding in the medial right thigh. No  subcutaneous emphysema. IMPRESSION: Soft tissue inflammation in the medial right thigh. No soft tissue gas or acute osseous abnormality. Electronically Signed   By: Obie Dredge M.D.   On: 11/20/2021 18:07    PROCEDURES:  Critical Care performed: No  Procedures   MEDICATIONS ORDERED IN ED: Medications  oxyCODONE-acetaminophen (PERCOCET/ROXICET) 5-325 MG per tablet 1 tablet (has no administration in time range)  sodium chloride 0.9 % bolus 1,000 mL (1,000 mLs Intravenous New Bag/Given 11/20/21 2015)  morphine 4 MG/ML injection 4 mg (4 mg Intravenous Given 11/20/21 2012)  ondansetron (ZOFRAN) injection 4 mg (4 mg Intravenous Given 11/20/21 2010)  clindamycin (CLEOCIN) IVPB 900 mg (900 mg Intravenous New Bag/Given 11/20/21 2015)     IMPRESSION / MDM / ASSESSMENT AND PLAN / ED COURSE  I reviewed the triage vital signs and the nursing notes.                              Differential diagnosis includes, but is not limited to, abscess, cellulitis, insect bite   Patient's diagnosis is consistent with cellulitis and abscess of the right leg.  Patient presents emergency department with 2 erythematous lesions to the right thigh.  No history of recurrent skin infections.  Concern for abscess.  Both areas were indurated but not fluctuant.  Patient was concerned as her mother had a similar presentation that required incision and drainage in the OR.  Labs were reassuring.  Patient did have an ultrasound soft tissue of the right lower extremity which revealed a small heterogenous abscess measuring 1.7 x 0.9 x 8.7 cm.  This is roughly 1-1/2 to 2 cm below the surface of the skin.  We discussed incision and drainage, needle drainage versus aggressive treatment antibiotic.  This time patient would like to treat with antibiotics given the small size of this heterogenous abscess.  I feel this is an appropriate plan.  I have marked the edges of erythema/cellulitis with indelible marker and have instructed the patient  to return if she has worsening pain, systemic complaints or expansion outside the marked margins at this time.  Patient is agreeable with this plan.  Patient will be placed on clindamycin outpatient as she received IV clindamycin here..  While there was concern initially if patient did have an abscess in the muscle and that she would require admission, with reassuring labs, extremely small abscess in the subcutaneous tissue, exam I do not feel that the patient will require admission for IV antibiotics or surgical procedure.  Patient may follow-up with primary care as needed or return to the emergency department if needed.  Patient is given ED precautions to return to the ED for any worsening or new symptoms.        FINAL CLINICAL IMPRESSION(S) /  ED DIAGNOSES   Final diagnoses:  Cellulitis  Cellulitis and abscess of leg     Rx / DC Orders   ED Discharge Orders          Ordered    clindamycin (CLEOCIN) 300 MG capsule  4 times daily        11/20/21 2202    HYDROcodone-acetaminophen (NORCO/VICODIN) 5-325 MG tablet  Every 4 hours PRN        11/20/21 2202             Note:  This document was prepared using Dragon voice recognition software and may include unintentional dictation errors.   Lanette HampshireCuthriell, Annikah Lovins D, PA-C 11/20/21 2202    Sharman CheekStafford, Phillip, MD 11/20/21 2241

## 2021-11-20 NOTE — ED Notes (Signed)
Pt in with 2 abscesses to R upper leg. Pt states upper abscess popped open some yesterday and seeped a little bit of yellow drainage. Site hot, red, and raised. Pt denies fever but states has been "waking up soaked" since noticing the abscesses.

## 2021-11-20 NOTE — ED Provider Triage Note (Signed)
°  Emergency Medicine Provider Triage Evaluation Note  Monica Gonzalez , a 46 y.o.female,  was evaluated in triage.  Pt complains of abscess.  Patient states that she has had 2 abscesses develop on her right upper leg.  Was recently seen for this and prescribed doxycycline.  She is currently on day 3 of doxycycline, but does not feel that antibiotics are working.  Reports waking up sweating.  Denies chest pain, shortness of breath, abdominal pain, back pain, nausea/vomiting, headache, or urinary symptoms   Review of Systems  Positive: Right thigh pain, night sweats Negative: Denies fever, chest pain, vomiting  Physical Exam  There were no vitals filed for this visit. Gen:   Awake, no distress   Resp:  Normal effort  MSK:   Moves extremities without difficulty  Other:  2 erythematous lesions appreciated on the anterior aspect of the right thigh.  Medical Decision Making  Given the patient's initial medical screening exam, the following diagnostic evaluation has been ordered. The patient will be placed in the appropriate treatment space, once one is available, to complete the evaluation and treatment. I have discussed the plan of care with the patient and I have advised the patient that an ED physician or mid-level practitioner will reevaluate their condition after the test results have been received, as the results may give them additional insight into the type of treatment they may need.    Diagnostics: Labs, femur x-ray  Treatments: none immediately   Varney Daily, [place] 11/20/21 1731

## 2021-11-20 NOTE — ED Notes (Signed)
Pt placed on monitor; call bell within reach; stretcher locked low; rail up; given warm blanket.

## 2021-11-20 NOTE — ED Notes (Signed)
Pt stated her vitals including temp were taken during triage but this RN doesn't see a temp chart. Will check orally.

## 2021-11-20 NOTE — ED Triage Notes (Signed)
Patient c/o 2 abscesses to right upper leg. Patient reports she is taking doxycycline, with no relief of symptoms.

## 2021-11-25 DIAGNOSIS — L03115 Cellulitis of right lower limb: Secondary | ICD-10-CM | POA: Insufficient documentation

## 2021-12-26 ENCOUNTER — Emergency Department
Admission: EM | Admit: 2021-12-26 | Discharge: 2021-12-26 | Disposition: A | Discharge status: Home / Self Care | Payer: Self-pay | Attending: Emergency Medicine | Admitting: Emergency Medicine

## 2021-12-26 ENCOUNTER — Emergency Department: Payer: Self-pay

## 2021-12-26 ENCOUNTER — Other Ambulatory Visit: Payer: Self-pay

## 2021-12-26 DIAGNOSIS — M549 Dorsalgia, unspecified: Secondary | ICD-10-CM | POA: Insufficient documentation

## 2021-12-26 DIAGNOSIS — R1012 Left upper quadrant pain: Secondary | ICD-10-CM | POA: Insufficient documentation

## 2021-12-26 DIAGNOSIS — K5900 Constipation, unspecified: Secondary | ICD-10-CM | POA: Insufficient documentation

## 2021-12-26 DIAGNOSIS — M25512 Pain in left shoulder: Secondary | ICD-10-CM | POA: Insufficient documentation

## 2021-12-26 LAB — URINALYSIS, COMPLETE (UACMP) WITH MICROSCOPIC
Bilirubin Urine: NEGATIVE
Glucose, UA: NEGATIVE mg/dL
Hgb urine dipstick: NEGATIVE
Ketones, ur: NEGATIVE mg/dL
Nitrite: NEGATIVE
Protein, ur: NEGATIVE mg/dL
Specific Gravity, Urine: 1.017 (ref 1.005–1.030)
Squamous Epithelial / HPF: >50 — ABNORMAL HIGH (ref 0–5)
pH: 7 (ref 5.0–8.0)

## 2021-12-26 LAB — CBC WITH DIFFERENTIAL/PLATELET
Abs Immature Granulocytes: 0.03 10*3/uL (ref 0.00–0.07)
Basophils Absolute: 0.1 10*3/uL (ref 0.0–0.1)
Basophils Relative: 1 %
Eosinophils Absolute: 0.1 10*3/uL (ref 0.0–0.5)
Eosinophils Relative: 2 %
HCT: 41.1 % (ref 36.0–46.0)
Hemoglobin: 12.9 g/dL (ref 12.0–15.0)
Immature Granulocytes: 0 %
Lymphocytes Relative: 29 %
Lymphs Abs: 2.2 10*3/uL (ref 0.7–4.0)
MCH: 29.3 pg (ref 26.0–34.0)
MCHC: 31.4 g/dL (ref 30.0–36.0)
MCV: 93.4 fL (ref 80.0–100.0)
Monocytes Absolute: 0.7 10*3/uL (ref 0.1–1.0)
Monocytes Relative: 9 %
Neutro Abs: 4.5 10*3/uL (ref 1.7–7.7)
Neutrophils Relative %: 59 %
Platelets: 317 10*3/uL (ref 150–400)
RBC: 4.4 MIL/uL (ref 3.87–5.11)
RDW: 14.4 % (ref 11.5–15.5)
WBC: 7.6 10*3/uL (ref 4.0–10.5)
nRBC: 0 % (ref 0.0–0.2)

## 2021-12-26 LAB — COMPREHENSIVE METABOLIC PANEL
ALT: 14 U/L (ref 0–44)
AST: 20 U/L (ref 15–41)
Albumin: 3.2 g/dL — ABNORMAL LOW (ref 3.5–5.0)
Alkaline Phosphatase: 51 U/L (ref 38–126)
Anion gap: 5 (ref 5–15)
BUN: 6 mg/dL (ref 6–20)
CO2: 28 mmol/L (ref 22–32)
Calcium: 8.6 mg/dL — ABNORMAL LOW (ref 8.9–10.3)
Chloride: 105 mmol/L (ref 98–111)
Creatinine, Ser: 0.85 mg/dL (ref 0.44–1.00)
GFR, Estimated: >60 mL/min (ref >60)
Glucose, Bld: 93 mg/dL (ref 70–99)
Potassium: 4 mmol/L (ref 3.5–5.1)
Sodium: 138 mmol/L (ref 135–145)
Total Bilirubin: 0.7 mg/dL (ref 0.3–1.2)
Total Protein: 6.1 g/dL — ABNORMAL LOW (ref 6.5–8.1)

## 2021-12-26 LAB — LIPASE, BLOOD: Lipase: 29 U/L (ref 11–51)

## 2021-12-26 MED ORDER — POLYETHYLENE GLYCOL 3350 17 G PO PACK: 17.0000 g | PACK | Freq: Every day | ORAL | 0 refills | Status: DC

## 2021-12-26 MED ORDER — DOCUSATE SODIUM 100 MG PO CAPS: 100.0000 mg | ORAL_CAPSULE | Freq: Two times a day (BID) | ORAL | 2 refills | Status: AC

## 2021-12-26 MED ORDER — IOHEXOL 300 MG/ML  SOLN
100.0000 mL | Freq: Once | INTRAMUSCULAR | Status: AC | PRN
  Administered 2021-12-26: 100 mL via INTRAVENOUS
  Filled 2021-12-26: qty 100

## 2021-12-26 MED ORDER — KETOROLAC TROMETHAMINE 30 MG/ML IJ SOLN
30.0000 mg | Freq: Once | INTRAMUSCULAR | Status: AC
  Administered 2021-12-26: 30 mg via INTRAVENOUS
  Filled 2021-12-26: qty 1

## 2021-12-26 NOTE — ED Triage Notes (Signed)
Pt to ED for left arm pain and left mid back pain that started 2 days ago. Denies injuries. States thinks pulled muscle.  Denies urinary sx. Worsening pain with movement.

## 2021-12-26 NOTE — ED Provider Notes (Signed)
[facility] Provider Note    Event Date/Time   First MD Initiated Contact with Patient 12/26/21 1435     (approximate)   History   Arm Pain and Back Pain   HPI  Monica Gonzalez is a 46 y.o. female with a history of GERD, lupus who presents with complaints of left upper quadrant pain.  Patient reports the pain started 1 to 2 days ago.  She has been using a heat pad with little improvement.  She does report some constipation intermittently.  Also has pain in her left shoulder but attributes that to a pinched nerve.  Denies chest pain, no shortness of breath.  No fevers or chills reported.  No vomiting     Physical Exam   Triage Vital Signs: ED Triage Vitals [12/26/21 1427]  Enc Vitals Group     BP 105/66     Pulse Rate 64     Resp 20     Temp 98.1 F (36.7 C)     Temp Source Oral     SpO2 95 %     Weight 85.7 kg (189 lb)     Height 1.499 m (4\' 11" )     Head Circumference      Peak Flow      Pain Score 6     Pain Loc      Pain Edu?      Excl. in GC?     Most recent vital signs: Vitals:   12/26/21 1427 12/26/21 1525  BP: 105/66 (!) 108/54  Pulse: 64 (!) 50  Resp: 20 18  Temp: 98.1 F (36.7 C)   SpO2: 95% 99%     General: Awake, no distress.  CV:  Good peripheral perfusion.  Resp:  Normal effort.  Abd:  No distention.  Mild tenderness left upper quadrant, no CVA tenderness Other:     ED Results / Procedures / Treatments   Labs (all labs ordered are listed, but only abnormal results are displayed) Labs Reviewed  URINALYSIS, COMPLETE (UACMP) WITH MICROSCOPIC - Abnormal; Notable for the following components:      Result Value   Color, Urine YELLOW (*)    APPearance CLOUDY (*)    Leukocytes,Ua MODERATE (*)    Bacteria, UA RARE (*)    Squamous Epithelial / LPF >50 (*)    All other components within normal limits  COMPREHENSIVE METABOLIC PANEL - Abnormal; Notable for the following components:   Calcium 8.6 (*)    Total Protein  6.1 (*)    Albumin 3.2 (*)    All other components within normal limits  CBC WITH DIFFERENTIAL/PLATELET  LIPASE, BLOOD     EKG  ED ECG REPORT I, 02/23/22, the attending physician, personally viewed and interpreted this ECG.  Date: 12/26/2021 EKG Time:  Rate:  Rhythm: normal sinus rhythm QRS Axis: normal Intervals: normal ST/T Wave abnormalities: normal Narrative Interpretation: no evidence of acute ischemia    RADIOLOGY CT abdomen pelvis reviewed by me, no acute abnormalities    PROCEDURES:  Critical Care performed:   Procedures   MEDICATIONS ORDERED IN ED: Medications  ketorolac (TORADOL) 30 MG/ML injection 30 mg (30 mg Intravenous Given 12/26/21 1522)  iohexol (OMNIPAQUE) 300 MG/ML solution 100 mL (100 mLs Intravenous Contrast Given 12/26/21 1601)     IMPRESSION / MDM / ASSESSMENT AND PLAN / ED COURSE  I reviewed the triage vital signs and the nursing notes.  Patient presents with left upper quad abdominal pain as  detailed above, doubt GERD/gastritis given location, suspicious for constipation related pain, less likely diverticulitis.  Will give IV Toradol, check labs, obtain CT abdomen pelvis to rule out diverticulitis   CBC CMP lipase are reassuring, urinalysis is unremarkable.  CT abdomen pelvis without acute abnormality  Suspicion for constipation, will start the patient on MiraLAX and Colace outpatient follow-up as needed, return precautions discussed         FINAL CLINICAL IMPRESSION(S) / ED DIAGNOSES   Final diagnoses:  Left upper quadrant abdominal pain     Rx / DC Orders   ED Discharge Orders          Ordered    polyethylene glycol (MIRALAX) 17 g packet  Daily        12/26/21 1637    docusate sodium (COLACE) 100 MG capsule  2 times daily        12/26/21 1637             Note:  This document was prepared using Dragon voice recognition software and may include unintentional dictation errors.   Jene Every,  MD 12/26/21 [phone]

## 2022-04-15 ENCOUNTER — Other Ambulatory Visit: Payer: Self-pay

## 2022-04-15 ENCOUNTER — Encounter: Payer: Self-pay | Admitting: Emergency Medicine

## 2022-04-15 ENCOUNTER — Emergency Department: Payer: BC Managed Care – PPO

## 2022-04-15 ENCOUNTER — Emergency Department
Admission: EM | Admit: 2022-04-15 | Discharge: 2022-04-15 | Disposition: A | Discharge status: Home / Self Care | Payer: BC Managed Care – PPO | Attending: Emergency Medicine | Admitting: Emergency Medicine

## 2022-04-15 DIAGNOSIS — R9389 Abnormal findings on diagnostic imaging of other specified body structures: Secondary | ICD-10-CM | POA: Insufficient documentation

## 2022-04-15 DIAGNOSIS — N939 Abnormal uterine and vaginal bleeding, unspecified: Secondary | ICD-10-CM | POA: Diagnosis present

## 2022-04-15 DIAGNOSIS — R102 Pelvic and perineal pain: Secondary | ICD-10-CM

## 2022-04-15 DIAGNOSIS — N8003 Adenomyosis of the uterus: Secondary | ICD-10-CM | POA: Diagnosis not present

## 2022-04-15 HISTORY — DX: Fibromyalgia: M79.7

## 2022-04-15 LAB — CBC WITH DIFFERENTIAL/PLATELET
Abs Immature Granulocytes: 0.03 10*3/uL (ref 0.00–0.07)
Basophils Absolute: 0.1 10*3/uL (ref 0.0–0.1)
Basophils Relative: 1 %
Eosinophils Absolute: 0.1 10*3/uL (ref 0.0–0.5)
Eosinophils Relative: 2 %
HCT: 44.6 % (ref 36.0–46.0)
Hemoglobin: 13.8 g/dL (ref 12.0–15.0)
Immature Granulocytes: 1 %
Lymphocytes Relative: 26 %
Lymphs Abs: 1.7 10*3/uL (ref 0.7–4.0)
MCH: 29.1 pg (ref 26.0–34.0)
MCHC: 30.9 g/dL (ref 30.0–36.0)
MCV: 93.9 fL (ref 80.0–100.0)
Monocytes Absolute: 0.4 10*3/uL (ref 0.1–1.0)
Monocytes Relative: 6 %
Neutro Abs: 4.2 10*3/uL (ref 1.7–7.7)
Neutrophils Relative %: 64 %
Platelets: 326 10*3/uL (ref 150–400)
RBC: 4.75 MIL/uL (ref 3.87–5.11)
RDW: 13.5 % (ref 11.5–15.5)
WBC: 6.5 10*3/uL (ref 4.0–10.5)
nRBC: 0 % (ref 0.0–0.2)

## 2022-04-15 LAB — URINALYSIS, ROUTINE W REFLEX MICROSCOPIC
Bacteria, UA: NONE SEEN
Bilirubin Urine: NEGATIVE
Glucose, UA: NEGATIVE mg/dL
Ketones, ur: NEGATIVE mg/dL
Leukocytes,Ua: NEGATIVE
Nitrite: NEGATIVE
Protein, ur: NEGATIVE mg/dL
Specific Gravity, Urine: 1.003 — ABNORMAL LOW (ref 1.005–1.030)
pH: 6 (ref 5.0–8.0)

## 2022-04-15 LAB — BASIC METABOLIC PANEL
Anion gap: 6 (ref 5–15)
BUN: 7 mg/dL (ref 6–20)
CO2: 25 mmol/L (ref 22–32)
Calcium: 8.5 mg/dL — ABNORMAL LOW (ref 8.9–10.3)
Chloride: 109 mmol/L (ref 98–111)
Creatinine, Ser: 0.78 mg/dL (ref 0.44–1.00)
GFR, Estimated: >60 mL/min (ref >60)
Glucose, Bld: 94 mg/dL (ref 70–99)
Potassium: 3.6 mmol/L (ref 3.5–5.1)
Sodium: 140 mmol/L (ref 135–145)

## 2022-04-15 LAB — POC URINE PREG, ED: Preg Test, Ur: NEGATIVE

## 2022-04-15 MED ORDER — KETOROLAC TROMETHAMINE 30 MG/ML IJ SOLN
30.0000 mg | Freq: Once | INTRAMUSCULAR | Status: AC
Start: 2022-04-15 — End: 2022-04-15
  Administered 2022-04-15: 30 mg via INTRAMUSCULAR
  Filled 2022-04-15: qty 1

## 2022-04-15 NOTE — ED Provider Notes (Signed)
[facility] Provider Note    Event Date/Time   First MD Initiated Contact with Patient 04/15/22 1334     (approximate)   History   Vaginal Bleeding and Abdominal Cramping   HPI  Monica Gonzalez is a 46 y.o. female who presents to the ED for evaluation of Vaginal Bleeding and Abdominal Cramping   I reviewed OB/GYN clinic visit from 2021.  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  Patient reports a remote history of ovarian cysts when she was younger, but nothing recently.  Patient presents to the ED for evaluation of right-sided pelvic pain and heavy vaginal bleeding over the past 3 to 4 days.  She reports having a "normal" menstrual period a couple weeks ago and this is her "second period this month."  Denies any fevers or other novel discharge beyond the bleeding.  Denies any upper abdominal pain, emesis, dysuria or diarrhea.  Reports having an OB/GYN appointment scheduled for 1 week from today, next Friday.  Due to an episode of heavy bleeding last night, she reached out to her PCP and the nurse told her that she needed to get checked out in the ED before her OB visit next week.  Physical Exam   Triage Vital Signs: ED Triage Vitals  Enc Vitals Group     BP 04/15/22 1246 111/82     Pulse Rate 04/15/22 1246 63     Resp 04/15/22 1246 16     Temp 04/15/22 1246 98.7 F (37.1 C)     Temp Source 04/15/22 1246 Oral     SpO2 04/15/22 1246 95 %     Weight 04/15/22 1244 192 lb (87.1 kg)     Height 04/15/22 1244 4\' 11"  (1.499 m)     Head Circumference --      Peak Flow --      Pain Score 04/15/22 1244 6     Pain Loc --      Pain Edu? --      Excl. in GC? --     Most recent vital signs: Vitals:   04/15/22 1554 04/15/22 1603  BP: 111/75 110/80  Pulse: (!) 55 60  Resp: 18 16  Temp:  98.8 F (37.1 C)  SpO2: 97% 98%    General: Awake, no distress.  Pleasant and conversational.  Well-appearing.  Obese. CV:  Good peripheral  perfusion.  Resp:  Normal effort.  Abd:  No distention.  Deep RLQ abdominal tenderness without guarding or peritoneal features.  No pain at McBurney's point particular.  Upper abdomen is benign.  Benign LLQ.  Lesser tenderness to suprapubic abdomen. MSK:  No deformity noted.  Neuro:  No focal deficits appreciated. Other:     ED Results / Procedures / Treatments   Labs (all labs ordered are listed, but only abnormal results are displayed) Labs Reviewed  BASIC METABOLIC PANEL - Abnormal; Notable for the following components:      Result Value   Calcium 8.5 (*)    All other components within normal limits  URINALYSIS, ROUTINE W REFLEX MICROSCOPIC - Abnormal; Notable for the following components:   Color, Urine STRAW (*)    APPearance CLEAR (*)    Specific Gravity, Urine 1.003 (*)    Hgb urine dipstick LARGE (*)    All other components within normal limits  CBC WITH DIFFERENTIAL/PLATELET  POC URINE PREG, ED    EKG   RADIOLOGY Pelvic ultrasound interpreted by me  with area of endometrial stripe increased thickness  Official radiology report(s): US PELVIC COMPLETE W TRANSVAGINAL AND TORSION R/O  Result Date: 04/15/2022 CLINICAL DATA:  Pain and vaginal bleeding for 3 days. EXAM: TRANSABDOMINAL AND TRANSVAGINAL ULTRASOUND OF PELVIS DOPPLER ULTRASOUND OF OVARIES TECHNIQUE: Both transabdominal and transvaginal ultrasound examinations of the pelvis were performed. Transabdominal technique was performed for global imaging of the pelvis including uterus, ovaries, adnexal regions, and pelvic cul-de-sac. It was necessary to proceed with endovaginal exam following the transabdominal exam to visualize the uterus and endometrium. Color and duplex Doppler ultrasound was utilized to evaluate blood flow to the ovaries. COMPARISON:  Pelvic sonogram from September 22, 2016. FINDINGS: Uterus Measurements: 6.6 x 5.0 x 5.0 cm = volume: 86.92 mL. No fibroids or other mass visualized. Endometrium Thickness:  Measured at 3.3 but potentially up to 9 mm. Indistinct endometrium with limited assessment based on uterine position. Cystic areas likely within the myometrium adjacent to the endometrium without vascular flow. Largest measuring 0.8 x 0.5 x 0.5 cm Right ovary Measurements: 2.1 x 1.2 x 2.4 cm = volume: 2.95 mL. Normal appearance/no adnexal mass. Left ovary Measurements: 3.4 x 2.1 x 2.3 cm = volume: 8.6 mL. Normal appearance/no adnexal mass. Pulsed Doppler evaluation of both ovaries demonstrates normal low-resistance arterial and venous waveforms. Other findings No abnormal free fluid. IMPRESSION: Cystic changes in the myometrium adjacent to the endometrium. Endometrium which is indistinct measured at 3 mm but potentially up to 9 mm in greatest thickness. Given heterogeneity of the myometrium would consider the possibility of adenomyosis. Given indistinct appearance of the myometrium/endometrial interface and the setting of vaginal bleeding would consider gyn referral with consideration for endometrial biopsy particularly of bleeding is unresponsive to medical therapy. No sonographic evidence of ovarian torsion with normal grayscale appearance of the ovary showing low resistance flow on spectral Doppler. Electronically Signed   By: Donzetta Kohut M.D.   On: 04/15/2022 15:42    PROCEDURES and INTERVENTIONS:  Procedures  Medications  ketorolac (TORADOL) 30 MG/ML injection 30 mg (30 mg Intramuscular Given 04/15/22 1411)     IMPRESSION / MDM / ASSESSMENT AND PLAN / ED COURSE  I reviewed the triage vital signs and the nursing notes.  Differential diagnosis includes, but is not limited to, ovarian torsion, cyst, hemorrhagic cyst, PID, appendicitis, endometrial cancer  {Patient presents with symptoms of an acute illness or injury that is potentially life-threatening.  46 year old female presents to the ED with RLQ/right pelvic pain and vaginal bleeding.  She is not pregnant and ultrasound demonstrates an area  of thickened endometrium.  Questionable adenomyosis and malignancy is a possibility.  No significant bleeding while in the ED.  Her hemoglobin is normal, metabolic panel is normal and urine has no infectious features.  She looks systemically well and I see no barriers to outpatient management with OB/GYN follow-up.  We will discharge with return precautions.  Clinical Course as of 04/15/22 1707  Fri Apr 15, 2022  1559 Reassessed.  Patient ports feeling okay.  We discussed ultrasound results.  We discussed adenomyosis.  We discussed she may need an endometrial biopsy.  We discussed the importance of following up with OB next week, management with NSAIDs at home and return precautions for the ED. [DS]    Clinical Course User Index [DS] Delton Prairie, MD     FINAL CLINICAL IMPRESSION(S) / ED DIAGNOSES   Final diagnoses:  Vaginal bleeding  Adenomyosis of uterus  Thickened endometrium     Rx / DC Orders  ED Discharge Orders     None        Note:  This document was prepared using Dragon voice recognition software and may include unintentional dictation errors.   Delton PrairieSmith, Leigha Olberding, MD 04/15/22 [phone]

## 2022-04-15 NOTE — ED Triage Notes (Signed)
Pt to ED via POV stating that she started her menstrual cycle for a second time this month and that she is having heavy vaginal bleeding and is passing clots. Pt states that she is also having abdominal cramping. Pt is in NAD.

## 2022-04-15 NOTE — Discharge Instructions (Signed)
As we discussed, there is a possibility for something called adenomyosis contributing to your bleeding.  It was difficult to tell from a single ultrasound.  You may need eventually an endometrial biopsy.   Please follow-up with your OB/GYN next week to discuss these results and any next steps that they would recommend.  Please take Tylenol and ibuprofen/Advil for your pain.  It is safe to take them together, or to alternate them every few hours.  Take up to 1000mg  of Tylenol at a time, up to 4 times per day.  Do not take more than 4000 mg of Tylenol in 24 hours.  For ibuprofen, take 400-600 mg, 3 - 4 times per day.

## 2022-04-15 NOTE — ED Notes (Signed)
See triage note. Says this period started 3 days ago with different pain than usual--on right side and then she has been passing clots.

## 2022-05-25 ENCOUNTER — Encounter: Payer: Self-pay | Admitting: Emergency Medicine

## 2022-05-25 ENCOUNTER — Emergency Department
Admission: EM | Admit: 2022-05-25 | Discharge: 2022-05-25 | Disposition: A | Discharge status: Home / Self Care | Payer: BC Managed Care – PPO | Attending: Emergency Medicine | Admitting: Emergency Medicine

## 2022-05-25 ENCOUNTER — Emergency Department: Payer: BC Managed Care – PPO

## 2022-05-25 DIAGNOSIS — M545 Low back pain, unspecified: Secondary | ICD-10-CM | POA: Insufficient documentation

## 2022-05-25 MED ORDER — METHOCARBAMOL 500 MG PO TABS
1000.0000 mg | ORAL_TABLET | Freq: Once | ORAL | Status: AC
  Administered 2022-05-25: 1000 mg via ORAL
  Filled 2022-05-25: qty 2

## 2022-05-25 MED ORDER — ORPHENADRINE CITRATE 30 MG/ML IJ SOLN: 60.0000 mg | Freq: Once | INTRAMUSCULAR | Status: DC

## 2022-05-25 MED ORDER — KETOROLAC TROMETHAMINE 10 MG PO TABS: 10.0000 mg | ORAL_TABLET | Freq: Four times a day (QID) | ORAL | 0 refills | Status: DC | PRN

## 2022-05-25 MED ORDER — KETOROLAC TROMETHAMINE 30 MG/ML IJ SOLN
30.0000 mg | Freq: Once | INTRAMUSCULAR | Status: AC
Start: 2022-05-25 — End: 2022-05-25
  Administered 2022-05-25: 30 mg via INTRAMUSCULAR
  Filled 2022-05-25: qty 1

## 2022-05-25 MED ORDER — METHOCARBAMOL 500 MG PO TABS: 500.0000 mg | ORAL_TABLET | Freq: Four times a day (QID) | ORAL | 0 refills | Status: DC

## 2022-05-25 MED ORDER — ACETAMINOPHEN 325 MG PO TABS
650.0000 mg | ORAL_TABLET | Freq: Once | ORAL | Status: AC
Start: 2022-05-25 — End: 2022-05-25
  Administered 2022-05-25: 650 mg via ORAL
  Filled 2022-05-25: qty 2

## 2022-05-25 NOTE — ED Triage Notes (Signed)
Pt presents via POV with complaints of lower back pain that started 3 days ago. She notes taking her prescribed medications without improvement to her sx. 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  Denies falls, injury, loss of control of bowels, or urinary sx.
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
## 2022-06-11 ENCOUNTER — Encounter (HOSPITAL_COMMUNITY): Payer: Self-pay | Admitting: Urgent Care

## 2022-07-11 ENCOUNTER — Inpatient Hospital Stay: Admission: RE | Admit: 2022-07-11 | Payer: PRIVATE HEALTH INSURANCE | Source: Ambulatory Visit

## 2022-07-23 ENCOUNTER — Emergency Department
Admission: EM | Admit: 2022-07-23 | Discharge: 2022-07-23 | Disposition: A | Discharge status: Home / Self Care | Payer: BLUE CROSS/BLUE SHIELD | Attending: Emergency Medicine | Admitting: Emergency Medicine

## 2022-07-23 ENCOUNTER — Other Ambulatory Visit: Payer: Self-pay

## 2022-07-23 ENCOUNTER — Emergency Department: Payer: BLUE CROSS/BLUE SHIELD

## 2022-07-23 DIAGNOSIS — J45909 Unspecified asthma, uncomplicated: Secondary | ICD-10-CM | POA: Diagnosis not present

## 2022-07-23 DIAGNOSIS — W010XXA Fall on same level from slipping, tripping and stumbling without subsequent striking against object, initial encounter: Secondary | ICD-10-CM | POA: Insufficient documentation

## 2022-07-23 DIAGNOSIS — S4991XA Unspecified injury of right shoulder and upper arm, initial encounter: Secondary | ICD-10-CM | POA: Diagnosis present

## 2022-07-23 DIAGNOSIS — M25511 Pain in right shoulder: Secondary | ICD-10-CM

## 2022-07-23 MED ORDER — OXYCODONE-ACETAMINOPHEN 5-325 MG PO TABS
1.0000 | ORAL_TABLET | Freq: Once | ORAL | Status: AC
  Administered 2022-07-23: 1 via ORAL
  Filled 2022-07-23: qty 1

## 2022-07-23 NOTE — ED Triage Notes (Signed)
Pt states she was chasing her grandson and had mechanical slip and fall and landed on her right arm on Thursday and c/o continued soreness with pressure/ use. Pt has full ROM, CMS intact, no bruising or obvious swelling noted. No relief w/ OTC medication. Pt AOX4, in NAD, denies blood thinners, denies other injury/pain.

## 2022-07-23 NOTE — Discharge Instructions (Signed)
Your x-ray did not show any broken bone.  You may have sprained your shoulder.  You can wear the shoulder immobilizer as needed but you should only do this for a maximum of 3 to 5 days as you want to be moving her shoulder around so does not get frozen.  Please follow-up with your primary care doctor.

## 2022-07-23 NOTE — ED Provider Notes (Signed)
[facility] Provider Note    Event Date/Time   First MD Initiated Contact with Patient 07/23/22 1725     (approximate)   History   Arm Injury   HPI  Monica Gonzalez is a 46 y.o. female with past medical history of fibromyalgia GERD, lupus who presents with right arm pain.  2 days ago patient was running after her grandson when she tripped and fell.  Her elbow was held in flexion and she fell directly onto the forearm.  She endorses pain in the upper arm and shoulder region.  She is able to range it but limited.  Has been taking ibuprofen and gabapentin for pain.  She thinks she may have hit her head but denies headache or neck pain.     Past Medical History:  Diagnosis Date   Anemia    Asthma    Fibromyalgia    GERD (gastroesophageal reflux disease)    Heart murmur    History of hiatal hernia    Lupus (HCC)     Patient Active Problem List   Diagnosis Date Noted   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     Physical Exam  Triage Vital Signs: ED Triage Vitals  Enc Vitals Group     BP 07/23/22 1711 106/76     Pulse Rate 07/23/22 1711 60     Resp 07/23/22 1711 17     Temp 07/23/22 1711 98.3 F (36.8 C)     Temp Source 07/23/22 1711 Oral     SpO2 07/23/22 1711 98 %     Weight 07/23/22 1712 195 lb (88.5 kg)     Height 07/23/22 1712 4\' 11"  (1.499 m)     Head Circumference --      Peak Flow --      Pain Score 07/23/22 1712 7     Pain Loc --      Pain Edu? --      Excl. in GC? --     Most recent vital signs: Vitals:   07/23/22 1711  BP: 106/76  Pulse: 60  Resp: 17  Temp: 98.3 F (36.8 C)  SpO2: 98%     General: Awake, no distress.  CV:  Good peripheral perfusion.  Resp:  Normal effort.  Abd:  No distention.  Neuro:             Awake, Alert, Oriented x 3  Other:  Mild tenderness to palpation over the right anterior shoulder and AC joint, limited flexion secondary to pain, able to abduct, no obvious skin changes  or deformity of the shoulder Mild tenderness to palpation of the right upper arm without deformity No focal bony tenderness of the elbow forearm wrist Superficial abrasions over the right lateral forearm   ED Results / Procedures / Treatments  Labs (all labs ordered are listed, but only abnormal results are displayed) Labs Reviewed - No data to display   EKG     RADIOLOGY   X-ray of the right shoulder reviewed and interpreted by myself is negative for fracture dislocation PROCEDURES:  Critical Care performed: No  Procedures   MEDICATIONS ORDERED IN ED: Medications  oxyCODONE-acetaminophen (PERCOCET/ROXICET) 5-325 MG per tablet 1 tablet (1 tablet Oral Given 07/23/22 1745)     IMPRESSION / MDM / ASSESSMENT AND PLAN / ED COURSE  I reviewed the triage vital signs and the nursing notes.  Patient's presentation is most consistent with acute complicated illness / injury requiring diagnostic workup.  Differential diagnosis includes, but is not limited to, [facility] separation, shoulder sprain, fracture, contusion  The patient is a 46 year old female who presents with pain of the right upper arm and shoulder after a fall.  This happened 2 days ago she was running after her grandson when she tripped and fell falling directly on the forearm which was held in flexion.  Her pain is primarily in the upper arm and shoulder region.  On exam she has some abrasions of the forearm but is not really tender in that region.  She can range the wrist and elbow which are nontender.  She has some pain over the [facility] joint and some limited flexion.  Will obtain x-ray of the shoulder includes the humerus.  X-rays negative.  Patient does have significant discomfort in the shoulder.  Will place in a shoulder immobilizer just for several days.  I recommend that she not keep the shoulder immobilized for more than several days to avoid frozen shoulder.  Primary care follow-up.       FINAL CLINICAL IMPRESSION(S) / ED DIAGNOSES   Final diagnoses:  Pain in joint of right shoulder     Rx / DC Orders   ED Discharge Orders     None        Note:  This document was prepared using Dragon voice recognition software and may include unintentional dictation errors.   Georga Hacking, MD 07/23/22 [phone]

## 2022-07-26 ENCOUNTER — Ambulatory Visit: Payer: BLUE CROSS/BLUE SHIELD | Admitting: Orthopedic Surgery

## 2022-08-24 NOTE — Progress Notes (Deleted)
Referring Physician:  Rada Hay, MD [address] [place],  [place] [address]  Primary Physician:  Leonel Ramsay, MD  History of Present Illness: 08/24/2022 Monica Gonzalez has a history of FM, asthma, and lupus.   History of chronic LBP and she has seen ortho at the [facility] on 04/22/22. She was sent to PT. Was seen in ED on 05/25/22. She was sent home with toradol and robaxin.   She has allergy to prednisone.      Duration: *** Location: *** Quality: *** Severity: ***  Precipitating: aggravated by *** Modifying factors: made better by *** Weakness: none Timing: *** Bowel/Bladder Dysfunction: none  Conservative measures:  Physical therapy: ***  Multimodal medical therapy including regular antiinflammatories: toradol, robaxin  Injections: *** epidural steroid injections  Past Surgery: ***  Monica Gonzalez has ***no symptoms of cervical myelopathy.  The symptoms are causing a significant impact on the patient's life.   Review of Systems:  A 10 point review of systems is negative, except for the pertinent positives and negatives detailed in the HPI.  Past Medical History: Past Medical History:  Diagnosis Date   Anemia    Asthma    Fibromyalgia    GERD (gastroesophageal reflux disease)    Heart murmur    History of hiatal hernia    Lupus ([place])     Past Surgical History: Past Surgical History:  Procedure Laterality Date   TONSILLECTOMY     TUBAL LIGATION     VULVECTOMY N/A 07/29/2020   Procedure: WIDE EXCISION VULVECTOMY;  Surgeon: Mellody Drown, MD;  Location: [facility] ORS;  Service: Gynecology;  Laterality: N/A;    Allergies: Allergies as of 08/26/2022 - Review Complete 07/23/2022  Allergen Reaction Noted   Prednisone Anaphylaxis 05/03/2015    Medications: Outpatient Encounter Medications as of 08/26/2022  Medication Sig   albuterol (PROVENTIL HFA) 108 (90 Base) MCG/ACT inhaler Inhale 2 puffs into the lungs every 4 (four)  hours as needed for wheezing or shortness of breath.   Ascorbic Acid (VITAMIN C) 500 MG CAPS Take 1 tablet by mouth daily.   brompheniramine-pseudoephedrine-DM 30-2-10 MG/5ML syrup Take 5 mLs by mouth 4 (four) times daily as needed.   cyclobenzaprine (FLEXERIL) 10 MG tablet Take 1 tablet (10 mg total) by mouth 3 (three) times daily as needed.   docusate sodium (COLACE) 100 MG capsule Take 1 capsule (100 mg total) by mouth 2 (two) times daily.   ferrous sulfate 325 (65 FE) MG tablet Take 325 mg by mouth daily.    guaiFENesin-codeine (ROBITUSSIN AC) 100-10 MG/5ML syrup Take 10 mLs by mouth 3 (three) times daily as needed for cough.   HYDROcodone-acetaminophen (NORCO/VICODIN) 5-325 MG tablet Take 1 tablet by mouth every 4 (four) hours as needed for moderate pain.   ibuprofen (ADVIL) 600 MG tablet Take 1 tablet (600 mg total) by mouth every 6 (six) hours as needed. (Patient taking differently: Take 600 mg by mouth every 6 (six) hours as needed (PAIN.). )   ketorolac (TORADOL) 10 MG tablet Take 1 tablet (10 mg total) by mouth every 6 (six) hours as needed.   methocarbamol (ROBAXIN) 500 MG tablet Take 1 tablet (500 mg total) by mouth 4 (four) times daily.   polyethylene glycol (MIRALAX) 17 g packet Take 17 g by mouth daily.   No facility-administered encounter medications on file as of 08/26/2022.    Social History: Social History   Tobacco Use   Smoking status: Never   Smokeless tobacco:  Never  Vaping Use   Vaping Use: Never used  Substance Use Topics   Alcohol use: Not Currently   Drug use: No    Family Medical History: Family History  Problem Relation Age of Onset   Diabetes Mother    Congestive Heart Failure Mother     Physical Examination: There were no vitals filed for this visit.  General: Patient is well developed, well nourished, calm, collected, and in no apparent distress. Attention to examination is appropriate.  Respiratory: Patient is breathing without any  difficulty.   NEUROLOGICAL:     Awake, alert, oriented to person, place, and time.  Speech is clear and fluent. Fund of knowledge is appropriate.   Cranial Nerves: Pupils equal round and reactive to light.  Facial tone is symmetric.  Facial sensation is symmetric.  ROM of spine:  *** ROM of cervical spine *** pain *** ROM of lumbar spine *** pain  No abnormal lesions on exposed skin.   Strength: Side Biceps Triceps Deltoid Interossei Grip Wrist Ext. Wrist Flex.  R 5 5 5 5 5 5 5   L 5 5 5 5 5 5 5    Side Iliopsoas Quads Hamstring PF DF EHL  R 5 5 5 5 5 5   L 5 5 5 5 5 5    Reflexes are ***2+ and symmetric at the biceps, triceps, brachioradialis, patella and achilles.   Hoffman's is absent.  Clonus is not present.   Bilateral upper and lower extremity sensation is intact to light touch.    No evidence of dysmetria noted.  Gait is normal.   ***No difficulty with tandem gait.    Medical Decision Making  Imaging: Lumbar spine xrays 05/25/22:  FINDINGS: Normal lumbar lordosis.   No evidence of fracture or dislocation. Vertebral body heights and intervertebral disc spaces are maintained.   Visualized bony pelvis appears intact.   IMPRESSION: Negative.     Electronically Signed   By: Julian Hy M.D.   On: 05/25/2022 22:49  I have personally reviewed the images and agree with the above interpretation.  Assessment and Plan: Monica Gonzalez is a pleasant 46 y.o. female with ***  Above treatment options discussed with patient and following plan made:   - Order for physical therapy for *** spine ***. - Continue on current medications including ***. Reviewed proper dosing along with risks and benefits. Take and NSAIDs with food.      I spent a total of *** minutes in face-to-face and non-face-to-face activities related to this patient's care today.  Thank you for involving me in the care of this patient.   Geronimo Boot PA-C Dept. of Neurosurgery

## 2022-08-26 ENCOUNTER — Ambulatory Visit: Payer: BLUE CROSS/BLUE SHIELD | Admitting: Orthopedic Surgery

## 2022-10-14 ENCOUNTER — Other Ambulatory Visit: Payer: Self-pay | Admitting: Family Medicine

## 2022-10-14 ENCOUNTER — Ambulatory Visit
Admission: RE | Admit: 2022-10-14 | Discharge: 2022-10-14 | Disposition: A | Discharge status: Home / Self Care | Payer: BLUE CROSS/BLUE SHIELD | Source: Ambulatory Visit | Attending: Family Medicine | Admitting: Family Medicine

## 2022-10-14 DIAGNOSIS — R1011 Right upper quadrant pain: Secondary | ICD-10-CM

## 2022-10-14 DIAGNOSIS — R112 Nausea with vomiting, unspecified: Secondary | ICD-10-CM | POA: Diagnosis present

## 2022-10-14 DIAGNOSIS — K449 Diaphragmatic hernia without obstruction or gangrene: Secondary | ICD-10-CM | POA: Insufficient documentation

## 2022-10-14 DIAGNOSIS — Z419 Encounter for procedure for purposes other than remedying health state, unspecified: Secondary | ICD-10-CM | POA: Diagnosis not present

## 2022-10-20 ENCOUNTER — Other Ambulatory Visit: Payer: Self-pay | Admitting: Family Medicine

## 2022-10-20 DIAGNOSIS — K769 Liver disease, unspecified: Secondary | ICD-10-CM

## 2022-11-06 ENCOUNTER — Other Ambulatory Visit: Payer: Self-pay

## 2022-11-06 ENCOUNTER — Emergency Department
Admission: EM | Admit: 2022-11-06 | Discharge: 2022-11-06 | Disposition: A | Discharge status: Home / Self Care | Payer: BLUE CROSS/BLUE SHIELD | Attending: Emergency Medicine | Admitting: Emergency Medicine

## 2022-11-06 ENCOUNTER — Emergency Department: Payer: BLUE CROSS/BLUE SHIELD

## 2022-11-06 DIAGNOSIS — J189 Pneumonia, unspecified organism: Secondary | ICD-10-CM | POA: Diagnosis not present

## 2022-11-06 DIAGNOSIS — J45909 Unspecified asthma, uncomplicated: Secondary | ICD-10-CM | POA: Diagnosis not present

## 2022-11-06 DIAGNOSIS — Z20822 Contact with and (suspected) exposure to covid-19: Secondary | ICD-10-CM | POA: Diagnosis not present

## 2022-11-06 DIAGNOSIS — J101 Influenza due to other identified influenza virus with other respiratory manifestations: Secondary | ICD-10-CM | POA: Diagnosis not present

## 2022-11-06 DIAGNOSIS — R059 Cough, unspecified: Secondary | ICD-10-CM | POA: Diagnosis present

## 2022-11-06 LAB — TROPONIN I (HIGH SENSITIVITY): Troponin I (High Sensitivity): <2 ng/L (ref <18)

## 2022-11-06 LAB — BASIC METABOLIC PANEL
Anion gap: 6 (ref 5–15)
BUN: 6 mg/dL (ref 6–20)
CO2: 24 mmol/L (ref 22–32)
Calcium: 8.7 mg/dL — ABNORMAL LOW (ref 8.9–10.3)
Chloride: 111 mmol/L (ref 98–111)
Creatinine, Ser: 1.01 mg/dL — ABNORMAL HIGH (ref 0.44–1.00)
GFR, Estimated: >60 mL/min (ref >60)
Glucose, Bld: 92 mg/dL (ref 70–99)
Potassium: 4.3 mmol/L (ref 3.5–5.1)
Sodium: 141 mmol/L (ref 135–145)

## 2022-11-06 LAB — RESP PANEL BY RT-PCR (RSV, FLU A&B, COVID)  RVPGX2
Influenza A by PCR: POSITIVE — AB
Influenza B by PCR: NEGATIVE
Resp Syncytial Virus by PCR: NEGATIVE
SARS Coronavirus 2 by RT PCR: NEGATIVE

## 2022-11-06 LAB — CBC
HCT: 43.9 % (ref 36.0–46.0)
Hemoglobin: 13.5 g/dL (ref 12.0–15.0)
MCH: 29.1 pg (ref 26.0–34.0)
MCHC: 30.8 g/dL (ref 30.0–36.0)
MCV: 94.6 fL (ref 80.0–100.0)
Platelets: 296 10*3/uL (ref 150–400)
RBC: 4.64 MIL/uL (ref 3.87–5.11)
RDW: 14.4 % (ref 11.5–15.5)
WBC: 8.1 10*3/uL (ref 4.0–10.5)
nRBC: 0 % (ref 0.0–0.2)

## 2022-11-06 MED ORDER — CEFTRIAXONE SODIUM 1 G IJ SOLR
1.0000 g | Freq: Once | INTRAMUSCULAR | Status: AC
  Administered 2022-11-06: 1 g via INTRAMUSCULAR
  Filled 2022-11-06: qty 10

## 2022-11-06 MED ORDER — AMOXICILLIN-POT CLAVULANATE 875-125 MG PO TABS: 1.0000 | ORAL_TABLET | Freq: Two times a day (BID) | ORAL | 0 refills | Status: DC

## 2022-11-06 MED ORDER — IPRATROPIUM-ALBUTEROL 0.5-2.5 (3) MG/3ML IN SOLN
3.0000 mL | Freq: Once | RESPIRATORY_TRACT | Status: AC
  Administered 2022-11-06: 3 mL via RESPIRATORY_TRACT
  Filled 2022-11-06: qty 3

## 2022-11-06 MED ORDER — BENZONATATE 100 MG PO CAPS: 100.0000 mg | ORAL_CAPSULE | Freq: Three times a day (TID) | ORAL | 0 refills | Status: DC | PRN

## 2022-11-06 MED ORDER — LIDOCAINE HCL (PF) 1 % IJ SOLN
5.0000 mL | Freq: Once | INTRAMUSCULAR | Status: DC
  Filled 2022-11-06: qty 5

## 2022-11-06 MED ORDER — ACETAMINOPHEN 325 MG PO TABS
650.0000 mg | ORAL_TABLET | Freq: Once | ORAL | Status: AC
  Administered 2022-11-06: 650 mg via ORAL
  Filled 2022-11-06: qty 2

## 2022-11-06 MED ORDER — IPRATROPIUM-ALBUTEROL 0.5-2.5 (3) MG/3ML IN SOLN: 3.0000 mL | RESPIRATORY_TRACT | 0 refills | Status: DC | PRN

## 2022-11-06 NOTE — ED Notes (Signed)
PER SUSAN PA - NO NEED TO REPEAT TROPONIN, DO NOT DO POC URINE PREGNANCY, PT HAS HAD TUBAL LIGATION

## 2022-11-06 NOTE — ED Notes (Signed)
Per Darl Pikes PA - DO NOT REPEAT TROP

## 2022-11-06 NOTE — Discharge Instructions (Signed)
Follow-up with your regular doctor if not improving 3 days.  Return emergency department worsening.  Take medications as prescribed 

## 2022-11-06 NOTE — ED Triage Notes (Signed)
Pt arrival POV to ED. Started Monday with chills and SOB. Pt t went to doctors office Friday was tested and all Flu, RSV, Covid tests were negative. Pt has been using inhaler every 4 hours but has not helped. Walking around causes her to get weaker and more SOB when ambulatory. Pt allergic to prednisone.

## 2022-11-06 NOTE — ED Notes (Signed)
pATIENT LEAVES WITH STABLE VITALS AND STEADY GAIT. VERBALIZES UNDERSTANDING OF ALL DISCHARGE INSTRUCTIONS AND HOW TO TAKE NEW PRESCRIPTIONS. ALL BELONGINGS TAKEN WITH PATIENT AT DISCHARGE.

## 2022-11-06 NOTE — ED Provider Notes (Signed)
[facility] Provider Note    Event Date/Time   First MD Initiated Contact with Patient 11/06/22 1811     (approximate)   History   Shortness of Breath and Cough   HPI  Monica Gonzalez is a 46 y.o. female with history of lupus and asthma presents emergency department complaining of chills that started on Monday along with some shortness of breath.  States very tired when she walks.  She went to the doctor on Friday and had a negative flu/COVID test.  States she has been using her inhaler every 4 hours but is not helping.  Cannot take prednisone because it makes her swell.  She states "I think I have pneumonia"      Physical Exam   Triage Vital Signs: ED Triage Vitals  Enc Vitals Group     BP 11/06/22 1504 115/66     Pulse Rate 11/06/22 1504 73     Resp 11/06/22 1504 20     Temp 11/06/22 1504 98.4 F (36.9 C)     Temp Source 11/06/22 1504 Oral     SpO2 11/06/22 1504 95 %     Weight 11/06/22 1504 191 lb (86.6 kg)     Height 11/06/22 1504 4\' 11"  (1.499 m)     Head Circumference --      Peak Flow --      Pain Score 11/06/22 1500 10     Pain Loc --      Pain Edu? --      Excl. in GC? --     Most recent vital signs: Vitals:   11/06/22 1504  BP: 115/66  Pulse: 73  Resp: 20  Temp: 98.4 F (36.9 C)  SpO2: 95%     General: Awake, no distress.   CV:  Good peripheral perfusion. regular rate and  rhythm Resp:  Normal effort. Lungs decreased air movement bilaterally, cough is harsh and dry Abd:  No distention.   Other:      ED Results / Procedures / Treatments   Labs (all labs ordered are listed, but only abnormal results are displayed) Labs Reviewed  RESP PANEL BY RT-PCR (RSV, FLU A&B, COVID)  RVPGX2 - Abnormal; Notable for the following components:      Result Value   Influenza A by PCR POSITIVE (*)    All other components within normal limits  BASIC METABOLIC PANEL - Abnormal; Notable for the following components:   Creatinine, Ser  1.01 (*)    Calcium 8.7 (*)    All other components within normal limits  CBC  POC URINE PREG, ED  TROPONIN I (HIGH SENSITIVITY)  TROPONIN I (HIGH SENSITIVITY)     EKG  EKG   RADIOLOGY Chest x-ray    PROCEDURES:   Procedures   MEDICATIONS ORDERED IN ED: Medications  cefTRIAXone (ROCEPHIN) injection 1 g (has no administration in time range)  lidocaine (PF) (XYLOCAINE) 1 % injection 5 mL (has no administration in time range)  ipratropium-albuterol (DUONEB) 0.5-2.5 (3) MG/3ML nebulizer solution 3 mL (has no administration in time range)  acetaminophen (TYLENOL) tablet 650 mg (has no administration in time range)     IMPRESSION / MDM / ASSESSMENT AND PLAN / ED COURSE  I reviewed the triage vital signs and the nursing notes.                              Differential diagnosis includes, but is not  limited to, COVID, influenza, RSV, CAP, dehydration, MI  Patient's presentation is most consistent with acute complicated illness / injury requiring diagnostic workup.   Patient's labs are reassuring, troponin is normal, respiratory panel is positive for influenza A  Chest x-ray independently reviewed and interpreted by me as having pneumonia in the left lower lung  EKG shows normal sinus rhythm, see physician read  Did explain all findings to the patient.  She was given a DuoNeb while here in the ED, Rocephin 1 g IM as she has influenza and pneumonia along with her lupus I would like to give her a Headstart on the pneumonia.  She can get a prescription filled tomorrow at [facility] on [address].  Will call in antibiotic, DuoNeb nebulizer Nebules and give her prescription for nebulizer DME.  Patient is in agreement with this treatment plan at this time.    The patient was discharged in stable condition.      FINAL CLINICAL IMPRESSION(S) / ED DIAGNOSES   Final diagnoses:  Influenza A  Community acquired pneumonia of left lower lobe of lung     Rx / DC Orders   ED  Discharge Orders          Ordered    amoxicillin-clavulanate (AUGMENTIN) 875-125 MG tablet  2 times daily        11/06/22 1838    ipratropium-albuterol (DUONEB) 0.5-2.5 (3) MG/3ML SOLN  Every 4 hours PRN        11/06/22 1838    benzonatate (TESSALON PERLES) 100 MG capsule  3 times daily PRN        11/06/22 1838    For home use only DME Nebulizer machine        11/06/22 1838             Note:  This document was prepared using Dragon voice recognition software and may include unintentional dictation errors.    Faythe Ghee, PA-C 11/06/22 2143    Minna Antis, MD 11/06/22 2203

## 2022-11-08 ENCOUNTER — Emergency Department
Admission: EM | Admit: 2022-11-08 | Discharge: 2022-11-08 | Disposition: A | Discharge status: Home / Self Care | Payer: BLUE CROSS/BLUE SHIELD | Attending: Student in an Organized Health Care Education/Training Program | Admitting: Student in an Organized Health Care Education/Training Program

## 2022-11-08 ENCOUNTER — Emergency Department: Payer: BLUE CROSS/BLUE SHIELD

## 2022-11-08 ENCOUNTER — Other Ambulatory Visit: Payer: Self-pay

## 2022-11-08 DIAGNOSIS — J181 Lobar pneumonia, unspecified organism: Secondary | ICD-10-CM | POA: Insufficient documentation

## 2022-11-08 DIAGNOSIS — J189 Pneumonia, unspecified organism: Secondary | ICD-10-CM | POA: Diagnosis not present

## 2022-11-08 DIAGNOSIS — J111 Influenza due to unidentified influenza virus with other respiratory manifestations: Secondary | ICD-10-CM

## 2022-11-08 DIAGNOSIS — J101 Influenza due to other identified influenza virus with other respiratory manifestations: Secondary | ICD-10-CM | POA: Insufficient documentation

## 2022-11-08 DIAGNOSIS — J45909 Unspecified asthma, uncomplicated: Secondary | ICD-10-CM | POA: Insufficient documentation

## 2022-11-08 DIAGNOSIS — R059 Cough, unspecified: Secondary | ICD-10-CM | POA: Diagnosis present

## 2022-11-08 MED ORDER — ONDANSETRON 4 MG PO TBDP: 4.0000 mg | ORAL_TABLET | Freq: Three times a day (TID) | ORAL | 0 refills | Status: DC | PRN

## 2022-11-08 MED ORDER — HYDROCOD POLI-CHLORPHE POLI ER 10-8 MG/5ML PO SUER: 5.0000 mL | Freq: Two times a day (BID) | ORAL | 0 refills | Status: DC | PRN

## 2022-11-08 MED ORDER — ONDANSETRON 4 MG PO TBDP
4.0000 mg | ORAL_TABLET | Freq: Once | ORAL | Status: AC
  Administered 2022-11-08: 4 mg via ORAL
  Filled 2022-11-08: qty 1

## 2022-11-08 NOTE — Discharge Instructions (Signed)
Take the prescription meds as directed. Follow-up with your PCP for continued symptoms.

## 2022-11-08 NOTE — ED Triage Notes (Signed)
Reports was here on Sunday dx with flu and pneumonia.  Reports still hurting in ribs and back.  Reports not getting relief from inhaler or antibiotics.

## 2022-11-08 NOTE — ED Provider Triage Note (Signed)
Emergency Medicine Provider Triage Evaluation Note  Monica Gonzalez , a 46 y.o. female  was evaluated in triage.  Pt complains of cough and pain when coughing. Has fevers. Reports that she was diagnosed with flu and pneumonia on 12/24. Discharged on augmentin and nebulizer and cough medicine. Reports symptoms are not improved.  Review of Systems  Positive: Cough, SOB,  Negative: N/v/d  Physical Exam  BP 121/76 (BP Location: Left Arm)   Temp 98.6 F (37 C) (Oral)   Resp 18   Ht 4\' 11"  (1.499 m)   Wt 86.6 kg   LMP 04/13/2022   SpO2 96%   BMI 38.58 kg/m  Gen:   Awake, no distress   Resp:  Normal effort  MSK:   Moves extremities without difficulty Other:    Medical Decision Making  Medically screening exam initiated at 3:31 PM.  Appropriate orders placed.  Monica Gonzalez was informed that the remainder of the evaluation will be completed by another provider, this initial triage assessment does not replace that evaluation, and the importance of remaining in the ED until their evaluation is complete.     Mathis Dad, PA-C 11/08/22 1531

## 2022-11-14 DIAGNOSIS — Z419 Encounter for procedure for purposes other than remedying health state, unspecified: Secondary | ICD-10-CM | POA: Diagnosis not present

## 2022-11-15 ENCOUNTER — Other Ambulatory Visit: Payer: PRIVATE HEALTH INSURANCE

## 2022-11-16 ENCOUNTER — Other Ambulatory Visit: Payer: BLUE CROSS/BLUE SHIELD

## 2022-11-19 NOTE — ED Provider Notes (Signed)
[facility] Emergency Department Provider Note     Event Date/Time   First MD Initiated Contact with Patient 11/08/22 1649     (approximate)   History   Shortness of Breath   HPI  Monica Gonzalez is a 47 y.o. female, with a history of asthma, lupus, GERD, and fibromyalgia, presents with complaints of cough and pain when coughing, as well as fevers. Reports that she was diagnosed with flu and pneumonia on 12/24. Discharged on augmentin and nebulizer and cough medicine. Reports symptoms are not improved.        Physical Exam   Triage Vital Signs: ED Triage Vitals  Enc Vitals Group     BP 11/08/22 1531 121/76     Pulse Rate 11/08/22 1534 71     Resp 11/08/22 1531 18     Temp 11/08/22 1531 98.6 F (37 C)     Temp Source 11/08/22 1531 Oral     SpO2 11/08/22 1531 96 %     Weight 11/08/22 1531 191 lb (86.6 kg)     Height 11/08/22 1531 4\' 11"  (1.499 m)     Head Circumference --      Peak Flow --      Pain Score 11/08/22 1531 8     Pain Loc --      Pain Edu? --      Excl. in GC? --     Most recent vital signs: Vitals:   11/08/22 1531 11/08/22 1534  BP: 121/76   Pulse:  71  Resp: 18   Temp: 98.6 F (37 C)   SpO2: 96%     General Awake, no distress. NAD HEENT NCAT. PERRL. EOMI. No rhinorrhea. Mucous membranes are moist.  CV:  Good peripheral perfusion.  RESP:  Normal effort.  ABD:  No distention.    ED Results / Procedures / Treatments   Labs (all labs ordered are listed, but only abnormal results are displayed) Labs Reviewed - No data to display   EKG   RADIOLOGY  I personally viewed and evaluated these images as part of my medical decision making, as well as reviewing the written report by the radiologist.  ED Provider Interpretation: no acute findings  CXR  IMPRESSION: Minimal left lingular subsegmental atelectasis.   PROCEDURES:  Critical Care performed: No  Procedures   MEDICATIONS ORDERED IN  ED: Medications  ondansetron (ZOFRAN-ODT) disintegrating tablet 4 mg (4 mg Oral Given 11/08/22 1733)     IMPRESSION / MDM / ASSESSMENT AND PLAN / ED COURSE  I reviewed the triage vital signs and the nursing notes.                              Differential diagnosis includes, but is not limited to, COVID, influenza, bronchitis, viral URI  Patient's presentation is most consistent with acute complicated illness / injury requiring diagnostic workup.  Patient's diagnosis is consistent with ongoing symptoms related to her recent diagnosis of COVID and influenza.  Patient with no red flags on exam, presents in no acute distress.  No signs of tachycardia, tachypnea, or dehydration.  No consolidation noted on chest x-ray based on my interpretation.  Patient symptoms likely represent ongoing bronchospasm due to recent upper respiratory infections.  Patient will be discharged home with prescriptions for Zofran and Tussionex. Patient is to follow up with her primary provider or local urgent care as needed or otherwise directed. Patient is given  ED precautions to return to the ED for any worsening or new symptoms.  FINAL CLINICAL IMPRESSION(S) / ED DIAGNOSES   Final diagnoses:  Community acquired pneumonia of left upper lobe of lung  Influenza     Rx / DC Orders   ED Discharge Orders          Ordered    ondansetron (ZOFRAN-ODT) 4 MG disintegrating tablet  Every 8 hours PRN        11/08/22 1728    chlorpheniramine-HYDROcodone (TUSSIONEX) 10-8 MG/5ML  Every 12 hours PRN        11/08/22 1728             Note:  This document was prepared using Dragon voice recognition software and may include unintentional dictation errors.    Melvenia Needles, PA-C 11/19/22 1750    Merlyn Lot, MD 11/19/22 1931

## 2022-11-27 ENCOUNTER — Emergency Department
Admission: EM | Admit: 2022-11-27 | Discharge: 2022-11-27 | Disposition: A | Discharge status: Home / Self Care | Payer: 59 | Attending: Emergency Medicine | Admitting: Emergency Medicine

## 2022-11-27 ENCOUNTER — Other Ambulatory Visit: Payer: Self-pay

## 2022-11-27 ENCOUNTER — Emergency Department: Payer: 59

## 2022-11-27 ENCOUNTER — Encounter: Payer: Self-pay | Admitting: Emergency Medicine

## 2022-11-27 DIAGNOSIS — R109 Unspecified abdominal pain: Secondary | ICD-10-CM | POA: Diagnosis not present

## 2022-11-27 DIAGNOSIS — M25512 Pain in left shoulder: Secondary | ICD-10-CM | POA: Diagnosis not present

## 2022-11-27 DIAGNOSIS — Z20822 Contact with and (suspected) exposure to covid-19: Secondary | ICD-10-CM | POA: Diagnosis not present

## 2022-11-27 DIAGNOSIS — R0602 Shortness of breath: Secondary | ICD-10-CM | POA: Diagnosis present

## 2022-11-27 DIAGNOSIS — J40 Bronchitis, not specified as acute or chronic: Secondary | ICD-10-CM | POA: Insufficient documentation

## 2022-11-27 LAB — RESP PANEL BY RT-PCR (RSV, FLU A&B, COVID)  RVPGX2
Influenza A by PCR: NEGATIVE
Influenza B by PCR: NEGATIVE
Resp Syncytial Virus by PCR: NEGATIVE
SARS Coronavirus 2 by RT PCR: NEGATIVE

## 2022-11-27 LAB — CBC WITH DIFFERENTIAL/PLATELET
Abs Immature Granulocytes: 0.03 10*3/uL (ref 0.00–0.07)
Basophils Absolute: 0.1 10*3/uL (ref 0.0–0.1)
Basophils Relative: 2 %
Eosinophils Absolute: 0.2 10*3/uL (ref 0.0–0.5)
Eosinophils Relative: 2 %
HCT: 40.8 % (ref 36.0–46.0)
Hemoglobin: 12.8 g/dL (ref 12.0–15.0)
Immature Granulocytes: 0 %
Lymphocytes Relative: 36 %
Lymphs Abs: 2.7 10*3/uL (ref 0.7–4.0)
MCH: 29.3 pg (ref 26.0–34.0)
MCHC: 31.4 g/dL (ref 30.0–36.0)
MCV: 93.4 fL (ref 80.0–100.0)
Monocytes Absolute: 0.6 10*3/uL (ref 0.1–1.0)
Monocytes Relative: 9 %
Neutro Abs: 3.7 10*3/uL (ref 1.7–7.7)
Neutrophils Relative %: 51 %
Platelets: 344 10*3/uL (ref 150–400)
RBC: 4.37 MIL/uL (ref 3.87–5.11)
RDW: 14.3 % (ref 11.5–15.5)
WBC: 7.4 10*3/uL (ref 4.0–10.5)
nRBC: 0 % (ref 0.0–0.2)

## 2022-11-27 LAB — TROPONIN I (HIGH SENSITIVITY)
Troponin I (High Sensitivity): <2 ng/L (ref <18)
Troponin I (High Sensitivity): <2 ng/L (ref <18)

## 2022-11-27 LAB — COMPREHENSIVE METABOLIC PANEL
ALT: 24 U/L (ref 0–44)
AST: 28 U/L (ref 15–41)
Albumin: 3.4 g/dL — ABNORMAL LOW (ref 3.5–5.0)
Alkaline Phosphatase: 72 U/L (ref 38–126)
Anion gap: 7 (ref 5–15)
BUN: 8 mg/dL (ref 6–20)
CO2: 24 mmol/L (ref 22–32)
Calcium: 8.4 mg/dL — ABNORMAL LOW (ref 8.9–10.3)
Chloride: 106 mmol/L (ref 98–111)
Creatinine, Ser: 0.9 mg/dL (ref 0.44–1.00)
GFR, Estimated: >60 mL/min (ref >60)
Glucose, Bld: 83 mg/dL (ref 70–99)
Potassium: 3.7 mmol/L (ref 3.5–5.1)
Sodium: 137 mmol/L (ref 135–145)
Total Bilirubin: 1 mg/dL (ref 0.3–1.2)
Total Protein: 6.5 g/dL (ref 6.5–8.1)

## 2022-11-27 LAB — MONONUCLEOSIS SCREEN: Mono Screen: NEGATIVE

## 2022-11-27 LAB — POC URINE PREG, ED: Preg Test, Ur: NEGATIVE

## 2022-11-27 MED ORDER — IOHEXOL 350 MG/ML SOLN
75.0000 mL | Freq: Once | INTRAVENOUS | Status: AC | PRN
  Administered 2022-11-27: 75 mL via INTRAVENOUS

## 2022-11-27 MED ORDER — ALBUTEROL SULFATE HFA 108 (90 BASE) MCG/ACT IN AERS: 2.0000 | INHALATION_SPRAY | Freq: Four times a day (QID) | RESPIRATORY_TRACT | 2 refills | Status: DC | PRN

## 2022-11-27 MED ORDER — PSEUDOEPH-BROMPHEN-DM 30-2-10 MG/5ML PO SYRP: 10.0000 mL | ORAL_SOLUTION | Freq: Four times a day (QID) | ORAL | 0 refills | Status: DC | PRN

## 2022-11-27 MED ORDER — METHYLPREDNISOLONE 32 MG PO TABS: 32.0000 mg | ORAL_TABLET | Freq: Two times a day (BID) | ORAL | 0 refills | Status: AC

## 2022-11-27 MED ORDER — DEXAMETHASONE SODIUM PHOSPHATE 10 MG/ML IJ SOLN
10.0000 mg | Freq: Once | INTRAMUSCULAR | Status: AC
  Administered 2022-11-27: 10 mg via INTRAVENOUS
  Filled 2022-11-27: qty 1

## 2022-11-27 MED ORDER — BENZONATATE 100 MG PO CAPS: 100.0000 mg | ORAL_CAPSULE | Freq: Three times a day (TID) | ORAL | 0 refills | Status: DC | PRN

## 2022-11-27 NOTE — ED Triage Notes (Signed)
Pt was seen here 2.5 weeks ago and diagnosed with flu and pneumonia. Pt has completed all meds and still has SOB, dry hacking cough. Pt states that she is no better. Pt also c/o left neck pain with tingling in hand worse than usual and left flank pain.

## 2022-11-27 NOTE — ED Provider Notes (Signed)
[facility] Provider Note  Patient Contact: 7:03 PM (approximate)   History   Shortness of Breath and Cough   HPI  Monica Gonzalez is a 47 y.o. female who presents the emergency complaining cough, shortness of breath, pleuritic pain, pain in her left shoulder to her left flank.  Patient states that roughly a month ago she was diagnosed with a viral illness, seem to be worsening and return to her primary care was diagnosed with bronchitis.  Patient states that another week she was still worsening and was seen in the emergency department diagnosed with flu and pneumonia.  Patient states that after this she has been another 10 days before coming back to be reassessed for no improvement was diagnosed with bronchitis again.  Patient states that despite multiple rounds of steroids, antibiotics, symptom control medication she is not better.  She is now experiencing pain along the left back from her neck down through her flank.  She thinks that this is likely secondary to coughing.  She does have some pleuritic pain.  No history of bleeding or clotting disorders.  No hemoptysis.  Patient denies any abdominal symptoms to include nausea, vomiting, Donnell pain, urinary symptoms.     Physical Exam   Triage Vital Signs: ED Triage Vitals  Enc Vitals Group     BP 11/27/22 1759 (!) 96/47     Pulse Rate 11/27/22 1759 67     Resp 11/27/22 1759 18     Temp 11/27/22 1759 97.9 F (36.6 C)     Temp Source 11/27/22 1759 Oral     SpO2 11/27/22 1759 99 %     Weight 11/27/22 1800 187 lb (84.8 kg)     Height 11/27/22 1800 4\' 11"  (1.499 m)     Head Circumference --      Peak Flow --      Pain Score 11/27/22 1800 8     Pain Loc --      Pain Edu? --      Excl. in GC? --     Most recent vital signs: Vitals:   11/27/22 1759 11/27/22 2115  BP: (!) 96/47 103/69  Pulse: 67 (!) 55  Resp: 18 18  Temp: 97.9 F (36.6 C) 98 F (36.7 C)  SpO2: 99% 96%     General: Alert and in no  acute distress. Eyes:  PERRL. EOMI. ENT:      Ears:       Nose: No congestion/rhinnorhea.      Mouth/Throat: Mucous membranes are moist. Neck: No stridor. No cervical spine tenderness to palpation.  Cardiovascular:  Good peripheral perfusion Respiratory: Normal respiratory effort without tachypnea or retractions. Lungs with coarse breath sounds bilaterally, some mild expiratory wheezing.  No appreciable rales or rhonchi.2116 air entry to the bases with no decreased or absent breath sounds Gastrointestinal: Bowel sounds 4 quadrants. Soft and nontender to palpation. No guarding or rigidity. No palpable masses. No distention. No CVA tenderness. Musculoskeletal: Full range of motion to all extremities.  Neurologic:  No gross focal neurologic deficits are appreciated.  Skin:   No rash noted Other:   ED Results / Procedures / Treatments   Labs (all labs ordered are listed, but only abnormal results are displayed) Labs Reviewed  COMPREHENSIVE METABOLIC PANEL - Abnormal; Notable for the following components:      Result Value   Calcium 8.4 (*)    Albumin 3.4 (*)    All other components within normal limits  RESP PANEL BY RT-PCR (RSV, FLU A&B, COVID)  RVPGX2  CBC WITH DIFFERENTIAL/PLATELET  MONONUCLEOSIS SCREEN  URINALYSIS, ROUTINE W REFLEX MICROSCOPIC  POC URINE PREG, ED  TROPONIN I (HIGH SENSITIVITY)  TROPONIN I (HIGH SENSITIVITY)     EKG  ED ECG REPORT I, Charline Bills Zuriel Yeaman,  personally viewed and interpreted this ECG.   Date: 11/27/2022  EKG Time: 1957 hrs.  Rate: 56 bpm  Rhythm: unchanged from previous tracings, sinus bradycardia  Axis: Normal axis  Intervals:none  ST&T Change: No ST elevation or depression noted  Sinus bradycardia.  No STEMI.  No ischemic changes.  No significant change from previous EKG from 11/06/2022    RADIOLOGY  I personally viewed, evaluated, and interpreted these images as part of my medical decision making, as well as reviewing the  written report by the radiologist.  ED Provider Interpretation: No acute consolidations in the lung.  No evidence of PE.  CT Angio Chest PE W and/or Wo Contrast  Result Date: 11/27/2022 CLINICAL DATA:  Pulmonary embolism (PE) suspected, high prob EXAM: CT ANGIOGRAPHY CHEST WITH CONTRAST TECHNIQUE: Multidetector CT imaging of the chest was performed using the standard protocol during bolus administration of intravenous contrast. Multiplanar CT image reconstructions and MIPs were obtained to evaluate the vascular anatomy. RADIATION DOSE REDUCTION: This exam was performed according to the departmental dose-optimization program which includes automated exposure control, adjustment of the mA and/or kV according to patient size and/or use of iterative reconstruction technique. CONTRAST:  26mL OMNIPAQUE IOHEXOL 350 MG/ML SOLN COMPARISON:  November 08, 2022 FINDINGS: Cardiovascular: Satisfactory opacification of the pulmonary arteries to the segmental level. No evidence of pulmonary embolism. Normal heart size. No pericardial effusion. Thoracic aorta is normal in course and caliber. Mediastinum/Nodes: The visualized thyroid is unremarkable. No axillary or mediastinal adenopathy. Lungs/Pleura: No pleural effusion or pneumothorax. Trachea is patent. No focal consolidation or suspicious pulmonary mass. Upper Abdomen: Tiny hiatal hernia.  Hepatic steatosis. Musculoskeletal: No chest wall abnormality. No acute or significant osseous findings. Review of the MIP images confirms the above findings. IMPRESSION: 1. No CT evidence of pulmonary embolism. 2. Hepatic steatosis. 3. Tiny hiatal hernia. Electronically Signed   By: Valentino Saxon M.D.   On: 11/27/2022 20:46    PROCEDURES:  Critical Care performed: No  Procedures   MEDICATIONS ORDERED IN ED: Medications  iohexol (OMNIPAQUE) 350 MG/ML injection 75 mL (75 mLs Intravenous Contrast Given 11/27/22 2034)  dexamethasone (DECADRON) injection 10 mg (10 mg  Intravenous Given 11/27/22 2239)     IMPRESSION / MDM / ASSESSMENT AND PLAN / ED COURSE  I reviewed the triage vital signs and the nursing notes.                                 Differential diagnosis includes, but is not limited to, bronchitis, pneumonia, viral illness, PE   Patient's presentation is most consistent with acute presentation with potential threat to life or bodily function.   Patient's diagnosis is consistent with bronchitis.  Patient presents emergency department with ongoing coughing.  Patient had viral illness that turned into bronchitis, then she had the flu, then pneumonia and is still had some ongoing coughing.  Given the length of symptoms patient was evaluated with labs, imaging.  There is no evidence of consolidation consistent with pneumonia.  Patient's symptoms were consistent with bronchitis and given reassuring workup I suspect same.  At this time patient will be treated with steroids,  albuterol, cough medications.  Patient is allergic to prednisone but is done well with Medrol in the past.  She is given dexamethasone here, Medrol at home.  Return precautions discussed with the patient.  Follow-up with primary care as needed..  Patient is given ED precautions to return to the ED for any worsening or new symptoms.     FINAL CLINICAL IMPRESSION(S) / ED DIAGNOSES   Final diagnoses:  Bronchitis     Rx / DC Orders   ED Discharge Orders          Ordered    albuterol (VENTOLIN HFA) 108 (90 Base) MCG/ACT inhaler  Every 6 hours PRN        11/27/22 2237    methylPREDNISolone (MEDROL) 32 MG tablet  2 times daily        11/27/22 2237    benzonatate (TESSALON PERLES) 100 MG capsule  3 times daily PRN        11/27/22 2237    brompheniramine-pseudoephedrine-DM 30-2-10 MG/5ML syrup  4 times daily PRN        11/27/22 2237             Note:  This document was prepared using Dragon voice recognition software and may include unintentional dictation errors.    Brynda Peon 11/27/22 2241    Blake Divine, MD 11/28/22 1520

## 2022-11-27 NOTE — ED Notes (Signed)
Patient transported to CT 

## 2022-11-29 ENCOUNTER — Ambulatory Visit
Admission: RE | Admit: 2022-11-29 | Discharge: 2022-11-29 | Disposition: A | Discharge status: Home / Self Care | Payer: 59 | Source: Ambulatory Visit | Attending: Family Medicine | Admitting: Family Medicine

## 2022-11-29 DIAGNOSIS — K769 Liver disease, unspecified: Secondary | ICD-10-CM

## 2022-11-29 MED ORDER — GADOPICLENOL 0.5 MMOL/ML IV SOLN
10.0000 mL | Freq: Once | INTRAVENOUS | Status: AC | PRN
  Administered 2022-11-29: 10 mL via INTRAVENOUS

## 2022-12-15 DIAGNOSIS — Z419 Encounter for procedure for purposes other than remedying health state, unspecified: Secondary | ICD-10-CM | POA: Diagnosis not present

## 2022-12-29 DIAGNOSIS — S52531S Colles' fracture of right radius, sequela: Secondary | ICD-10-CM | POA: Insufficient documentation

## 2022-12-29 DIAGNOSIS — S52539A Colles' fracture of unspecified radius, initial encounter for closed fracture: Secondary | ICD-10-CM | POA: Insufficient documentation

## 2023-01-12 ENCOUNTER — Other Ambulatory Visit: Payer: Self-pay | Admitting: Orthopedic Surgery

## 2023-01-12 DIAGNOSIS — M4802 Spinal stenosis, cervical region: Secondary | ICD-10-CM

## 2023-01-16 ENCOUNTER — Ambulatory Visit
Admission: RE | Admit: 2023-01-16 | Discharge: 2023-01-16 | Disposition: A | Discharge status: Home / Self Care | Payer: 59 | Source: Ambulatory Visit | Attending: Orthopedic Surgery | Admitting: Orthopedic Surgery

## 2023-01-16 DIAGNOSIS — M4802 Spinal stenosis, cervical region: Secondary | ICD-10-CM

## 2023-01-31 ENCOUNTER — Inpatient Hospital Stay: Admission: RE | Admit: 2023-01-31 | Payer: BLUE CROSS/BLUE SHIELD | Source: Ambulatory Visit

## 2023-02-07 DIAGNOSIS — M797 Fibromyalgia: Secondary | ICD-10-CM | POA: Insufficient documentation

## 2023-02-10 ENCOUNTER — Ambulatory Visit: Admit: 2023-02-10 | Payer: BLUE CROSS/BLUE SHIELD | Admitting: Obstetrics and Gynecology

## 2023-02-10 SURGERY — HYSTERECTOMY, VAGINAL
Anesthesia: General

## 2023-02-16 ENCOUNTER — Encounter: Payer: Self-pay | Admitting: *Deleted

## 2023-02-17 ENCOUNTER — Encounter: Payer: Self-pay | Admitting: *Deleted

## 2023-02-17 ENCOUNTER — Encounter
Admission: RE | Disposition: A | Discharge status: Home / Self Care | Payer: Self-pay | Source: Home / Self Care | Attending: Gastroenterology

## 2023-02-17 ENCOUNTER — Ambulatory Visit: Payer: 59 | Admitting: Certified Registered Nurse Anesthetist

## 2023-02-17 ENCOUNTER — Ambulatory Visit
Admission: RE | Admit: 2023-02-17 | Discharge: 2023-02-17 | Disposition: A | Discharge status: Home / Self Care | Payer: 59 | Attending: Gastroenterology | Admitting: Gastroenterology

## 2023-02-17 DIAGNOSIS — J45909 Unspecified asthma, uncomplicated: Secondary | ICD-10-CM | POA: Insufficient documentation

## 2023-02-17 DIAGNOSIS — Z1211 Encounter for screening for malignant neoplasm of colon: Secondary | ICD-10-CM | POA: Diagnosis present

## 2023-02-17 DIAGNOSIS — K2100 Gastro-esophageal reflux disease with esophagitis, without bleeding: Secondary | ICD-10-CM | POA: Diagnosis not present

## 2023-02-17 DIAGNOSIS — K2950 Unspecified chronic gastritis without bleeding: Secondary | ICD-10-CM | POA: Diagnosis not present

## 2023-02-17 DIAGNOSIS — M797 Fibromyalgia: Secondary | ICD-10-CM | POA: Insufficient documentation

## 2023-02-17 DIAGNOSIS — M329 Systemic lupus erythematosus, unspecified: Secondary | ICD-10-CM | POA: Diagnosis not present

## 2023-02-17 DIAGNOSIS — K449 Diaphragmatic hernia without obstruction or gangrene: Secondary | ICD-10-CM | POA: Diagnosis not present

## 2023-02-17 DIAGNOSIS — K5730 Diverticulosis of large intestine without perforation or abscess without bleeding: Secondary | ICD-10-CM | POA: Diagnosis not present

## 2023-02-17 DIAGNOSIS — K640 First degree hemorrhoids: Secondary | ICD-10-CM | POA: Diagnosis not present

## 2023-02-17 HISTORY — PX: COLONOSCOPY WITH PROPOFOL: SHX5780

## 2023-02-17 HISTORY — PX: ESOPHAGOGASTRODUODENOSCOPY (EGD) WITH PROPOFOL: SHX5813

## 2023-02-17 LAB — POCT PREGNANCY, URINE: Preg Test, Ur: NEGATIVE

## 2023-02-17 SURGERY — COLONOSCOPY WITH PROPOFOL
Anesthesia: General

## 2023-02-17 MED ORDER — DEXMEDETOMIDINE HCL IN NACL 200 MCG/50ML IV SOLN
INTRAVENOUS | Status: DC | PRN
  Administered 2023-02-17: 8 ug via INTRAVENOUS
  Administered 2023-02-17: 4 ug via INTRAVENOUS

## 2023-02-17 MED ORDER — DEXMEDETOMIDINE HCL IN NACL 80 MCG/20ML IV SOLN
INTRAVENOUS | Status: AC
  Filled 2023-02-17: qty 20

## 2023-02-17 MED ORDER — LIDOCAINE HCL (CARDIAC) PF 100 MG/5ML IV SOSY
PREFILLED_SYRINGE | INTRAVENOUS | Status: DC | PRN
  Administered 2023-02-17: 50 mg via INTRAVENOUS

## 2023-02-17 MED ORDER — SODIUM CHLORIDE 0.9 % IV SOLN
INTRAVENOUS | Status: DC
  Administered 2023-02-17: 1000 mL via INTRAVENOUS

## 2023-02-17 MED ORDER — PROPOFOL 10 MG/ML IV BOLUS
INTRAVENOUS | Status: DC | PRN
  Administered 2023-02-17: 80 mg via INTRAVENOUS

## 2023-02-17 MED ORDER — PROPOFOL 500 MG/50ML IV EMUL
INTRAVENOUS | Status: DC | PRN
  Administered 2023-02-17: 120 ug/kg/min via INTRAVENOUS

## 2023-02-17 NOTE — Op Note (Signed)
[facility] Gastroenterology Patient Name: Monica BienenstockBrandy Picado Procedure Date: 02/17/2023 2:22 PM MRN: [id number] Account #: [id number] Date of Birth: 07/26/1976 Admit Type: Outpatient Age: 47 [facility] 3 Gender: Female Note Status: Finalized Instrument Name: Upper Endoscope [id number] Procedure:             Upper GI endoscopy Indications:           Dyspepsia Providers:             Eather Colasameron Yurani Fettes MD, MD Referring MD:          Clydie Braunavid Fitzgerald (Referring MD) Medicines:             Monitored Anesthesia Care Complications:         No immediate complications. Estimated blood loss:                         Minimal. Procedure:             Pre-Anesthesia Assessment:                        - Prior to the procedure, a History and Physical was                         performed, and patient medications and allergies were                         reviewed. The patient is competent. The risks and                         benefits of the procedure and the sedation options and                         risks were discussed with the patient. All questions                         were answered and informed consent was obtained.                         Patient identification and proposed procedure were                         verified by the physician, the nurse, the                         anesthesiologist, the anesthetist and the technician                         in the endoscopy suite. Mental Status Examination:                         alert and oriented. Airway Examination: normal                         oropharyngeal airway and neck mobility. Respiratory                         Examination: clear to auscultation. CV Examination:  normal. Prophylactic Antibiotics: The patient does not                         require prophylactic antibiotics. Prior                         Anticoagulants: The patient has taken no anticoagulant                         or  antiplatelet agents. ASA Grade Assessment: II - A                         patient with mild systemic disease. After reviewing                         the risks and benefits, the patient was deemed in                         satisfactory condition to undergo the procedure. The                         anesthesia plan was to use monitored anesthesia care                         (MAC). Immediately prior to administration of                         medications, the patient was re-assessed for adequacy                         to receive sedatives. The heart rate, respiratory                         rate, oxygen saturations, blood pressure, adequacy of                         pulmonary ventilation, and response to care were                         monitored throughout the procedure. The physical                         status of the patient was re-assessed after the                         procedure.                        After obtaining informed consent, the endoscope was                         passed under direct vision. Throughout the procedure,                         the patient's blood pressure, pulse, and oxygen                         saturations were monitored continuously. The Endoscope  was introduced through the mouth, and advanced to the                         second part of duodenum. The upper GI endoscopy was                         accomplished without difficulty. The patient tolerated                         the procedure well. Findings:      A small hiatal hernia was present.      LA Grade B (one or more mucosal breaks greater than 5 mm, not extending       between the tops of two mucosal folds) esophagitis with no bleeding was       found.      The entire examined stomach was normal. Biopsies were taken with a cold       forceps for Helicobacter pylori testing. Estimated blood loss was       minimal.      The examined duodenum was  normal. Impression:            - Small hiatal hernia.                        - LA Grade B reflux esophagitis with no bleeding.                        - Normal stomach. Biopsied.                        - Normal examined duodenum. Recommendation:        - Await pathology results.                        - Perform a colonoscopy today. Procedure Code(s):     --- Professional ---                        [phone], Esophagogastroduodenoscopy, flexible,                         transoral; with biopsy, single or multiple Diagnosis Code(s):     --- Professional ---                        K44.9, Diaphragmatic hernia without obstruction or                         gangrene                        K21.00, Gastro-esophageal reflux disease with                         esophagitis, without bleeding                        R10.13, Epigastric pain CPT copyright 2022 American Medical Association. All rights reserved. The codes documented in this report are preliminary and upon coder review may  be revised to meet current compliance requirements. Eather Colasameron Kesean Serviss MD, MD 02/17/2023 2:55:10 PM Number of Addenda: 0 Note Initiated On:  02/17/2023 2:22 PM Estimated Blood Loss:  Estimated blood loss was minimal.      [facility]

## 2023-02-17 NOTE — Transfer of Care (Signed)
Immediate Anesthesia Transfer of Care Note  Patient: Monica Gonzalez  Procedure(s) Performed: COLONOSCOPY WITH PROPOFOL ESOPHAGOGASTRODUODENOSCOPY (EGD) WITH PROPOFOL  Patient Location: PACU and Endoscopy Unit  Anesthesia Type:General  Level of Consciousness: drowsy and patient cooperative  Airway & Oxygen Therapy: Patient Spontanous Breathing  Post-op Assessment: Report given to RN and Post -op Vital signs reviewed and stable  Post vital signs: Reviewed and stable  Last Vitals:  Vitals Value Taken Time  BP 102/69 02/17/23 1452  Temp 36.8 C 02/17/23 1450  Pulse 75 02/17/23 1453  Resp 17 02/17/23 1453  SpO2 97 % 02/17/23 1453  Vitals shown include unvalidated device data.  Last Pain:  Vitals:   02/17/23 1450  TempSrc: Temporal  PainSc: 0-No pain         Complications: No notable events documented.

## 2023-02-17 NOTE — H&P (Signed)
Outpatient short stay form Pre-procedure 02/17/2023  Monica Bill, MD  Primary Physician: Monica Sell, MD  Reason for visit:  Dyspepsia/Colon cancer screening  History of present illness:    47 y/o lady with history of fibromyalgia and lupus here for EGD/Colonoscopy for dyspepsia and colon cancer screening. No first degree relatives with GI malignancies. No significant abdominal surgeries. No blood thinners.    Current Facility-Administered Medications:    0.9 %  sodium chloride infusion, , Intravenous, Continuous, Monica Gonzalez, Monica Muskrat, MD  Medications Prior to Admission  Medication Sig Dispense Refill Last Dose   albuterol (VENTOLIN HFA) 108 (90 Base) MCG/ACT inhaler Inhale 2 puffs into the lungs every 6 (six) hours as needed for wheezing or shortness of breath. 8 g 2 Past Week   Ascorbic Acid (VITAMIN C) 500 MG CAPS Take 1 tablet by mouth daily.   Past Month   benzonatate (TESSALON PERLES) 100 MG capsule Take 1 capsule (100 mg total) by mouth 3 (three) times daily as needed for cough. 30 capsule 0 Past Month   brompheniramine-pseudoephedrine-DM 30-2-10 MG/5ML syrup Take 10 mLs by mouth 4 (four) times daily as needed. 200 mL 0 Past Month   chlorpheniramine-HYDROcodone (TUSSIONEX) 10-8 MG/5ML Take 5 mLs by mouth every 12 (twelve) hours as needed for cough. 70 mL 0 Past Month   citalopram (CELEXA) 20 MG tablet Take 20 mg by mouth daily.   02/16/2023   cyclobenzaprine (FLEXERIL) 10 MG tablet Take 1 tablet (10 mg total) by mouth 3 (three) times daily as needed. 30 tablet 0 Past Week   famotidine (PEPCID) 40 MG tablet Take 40 mg by mouth daily.   02/16/2023   felodipine (PLENDIL) 2.5 MG 24 hr tablet Take 2.5 mg by mouth daily.   02/16/2023   ferrous sulfate 325 (65 FE) MG tablet Take 325 mg by mouth daily.    Past Week   gabapentin (NEURONTIN) 300 MG capsule Take 300 mg by mouth 3 (three) times daily.   02/16/2023   HYDROcodone-acetaminophen (NORCO/VICODIN) 5-325 MG tablet Take 1 tablet  by mouth every 6 (six) hours as needed for moderate pain.   Past Week   ibuprofen (ADVIL) 600 MG tablet Take 1 tablet (600 mg total) by mouth every 6 (six) hours as needed. (Patient taking differently: Take 600 mg by mouth every 6 (six) hours as needed (PAIN.).) 30 tablet 0 Past Week   ipratropium-albuterol (DUONEB) 0.5-2.5 (3) MG/3ML SOLN Take 3 mLs by nebulization every 4 (four) hours as needed. 360 mL 0 Past Month   ketorolac (TORADOL) 10 MG tablet Take 1 tablet (10 mg total) by mouth every 6 (six) hours as needed. 20 tablet 0 Past Month   ondansetron (ZOFRAN-ODT) 4 MG disintegrating tablet Take 1 tablet (4 mg total) by mouth every 8 (eight) hours as needed for nausea or vomiting. 15 tablet 0 Past Month   pantoprazole (PROTONIX) 40 MG tablet Take 40 mg by mouth 2 (two) times daily.   02/16/2023   polyethylene glycol (MIRALAX) 17 g packet Take 17 g by mouth daily. 14 each 0 02/16/2023   amoxicillin-clavulanate (AUGMENTIN) 875-125 MG tablet Take 1 tablet by mouth 2 (two) times daily. (Patient not taking: Reported on 02/17/2023) 20 tablet 0 Completed Course   methocarbamol (ROBAXIN) 500 MG tablet Take 1 tablet (500 mg total) by mouth 4 (four) times daily. (Patient not taking: Reported on 02/17/2023) 20 tablet 0 Not Taking     Allergies  Allergen Reactions   Prednisone Anaphylaxis  Past Medical History:  Diagnosis Date   Anemia    Asthma    Fibromyalgia    GERD (gastroesophageal reflux disease)    Heart murmur    History of hiatal hernia    Lupus     Review of systems:  Otherwise negative.    Physical Exam  Gen: Alert, oriented. Appears stated age.  HEENT: PERRLA. Lungs: No respiratory distress CV: RRR Abd: soft, benign, no masses Ext: No edema    Planned procedures: Proceed with EGD/colonoscopy. The patient understands the nature of the planned procedure, indications, risks, alternatives and potential complications including but not limited to bleeding, infection, perforation,  damage to internal organs and possible oversedation/side effects from anesthesia. The patient agrees and gives consent to proceed.  Please refer to procedure notes for findings, recommendations and patient disposition/instructions.     Monica Billameron Monica [name], MD [facility]

## 2023-02-17 NOTE — Anesthesia Preprocedure Evaluation (Signed)
Anesthesia Evaluation  Patient identified by MRN, date of birth, ID band Patient awake    Reviewed: Allergy & Precautions, NPO status , Patient's Chart, lab work & pertinent test results  Airway Mallampati: III  TM Distance: >3 FB Neck ROM: full    Dental  (+) Partial Upper, Poor Dentition   Pulmonary neg pulmonary ROS, asthma    Pulmonary exam normal        Cardiovascular Exercise Tolerance: Good negative cardio ROS Normal cardiovascular exam Rhythm:Regular Rate:Normal     Neuro/Psych negative neurological ROS  negative psych ROS   GI/Hepatic negative GI ROS, Neg liver ROS, hiatal hernia,GERD  Medicated,,  Endo/Other  negative endocrine ROS    Renal/GU negative Renal ROS  negative genitourinary   Musculoskeletal   Abdominal  (+) + obese  Peds negative pediatric ROS (+)  Hematology negative hematology ROS (+) Blood dyscrasia, anemia   Anesthesia Other Findings Past Medical History: No date: Anemia No date: Asthma No date: Fibromyalgia No date: GERD (gastroesophageal reflux disease) No date: Heart murmur No date: History of hiatal hernia No date: Lupus  Past Surgical History: No date: TONSILLECTOMY No date: TUBAL LIGATION 07/29/2020: VULVECTOMY; N/A     Comment:  Procedure: WIDE EXCISION VULVECTOMY;  Surgeon: Leida Lauth, MD;  Location: [facility] ORS;  Service: Gynecology;                Laterality: N/A;     Reproductive/Obstetrics negative OB ROS                             Anesthesia Physical Anesthesia Plan  ASA: 2  Anesthesia Plan: General   Post-op Pain Management:    Induction: Intravenous  PONV Risk Score and Plan: Propofol infusion and TIVA  Airway Management Planned: Natural Airway  Additional Equipment:   Intra-op Plan:   Post-operative Plan:   Informed Consent: I have reviewed the patients History and Physical, chart, labs and discussed  the procedure including the risks, benefits and alternatives for the proposed anesthesia with the patient or authorized representative who has indicated his/her understanding and acceptance.     Dental Advisory Given  Plan Discussed with: CRNA and Surgeon  Anesthesia Plan Comments:        Anesthesia Quick Evaluation

## 2023-02-17 NOTE — Op Note (Signed)
[facility] Gastroenterology Patient Name: Monica Gonzalez Procedure Date: 02/17/2023 2:21 PM MRN: [id number] Account #: [id number] Date of Birth: 08/03/1976 Admit Type: Outpatient Age: [age] Room: [facility] ENDO ROOM 3 Gender: Female Note Status: Finalized Instrument Name: Prentice DockerColonoscope [id number] Procedure:             Colonoscopy Indications:           Screening for colorectal malignant neoplasm Providers:             Eather Colasameron Nyaja [place] MD, MD Referring MD:          Clydie Braunavid Fitzgerald (Referring MD) Medicines:             Monitored Anesthesia Care Complications:         No immediate complications. Procedure:             Pre-Anesthesia Assessment:                        - Prior to the procedure, a History and Physical was                         performed, and patient medications and allergies were                         reviewed. The patient is competent. The risks and                         benefits of the procedure and the sedation options and                         risks were discussed with the patient. All questions                         were answered and informed consent was obtained.                         Patient identification and proposed procedure were                         verified by the physician, the nurse, the                         anesthesiologist, the anesthetist and the technician                         in the endoscopy suite. Mental Status Examination:                         alert and oriented. Airway Examination: normal                         oropharyngeal airway and neck mobility. Respiratory                         Examination: clear to auscultation. CV Examination:                         normal. Prophylactic Antibiotics: The patient does not  require prophylactic antibiotics. Prior                         Anticoagulants: The patient has taken no anticoagulant                         or antiplatelet agents. ASA Grade  Assessment: III - A                         patient with severe systemic disease. After reviewing                         the risks and benefits, the patient was deemed in                         satisfactory condition to undergo the procedure. The                         anesthesia plan was to use monitored anesthesia care                         (MAC). Immediately prior to administration of                         medications, the patient was re-assessed for adequacy                         to receive sedatives. The heart rate, respiratory                         rate, oxygen saturations, blood pressure, adequacy of                         pulmonary ventilation, and response to care were                         monitored throughout the procedure. The physical                         status of the patient was re-assessed after the                         procedure.                        After obtaining informed consent, the colonoscope was                         passed under direct vision. Throughout the procedure,                         the patient's blood pressure, pulse, and oxygen                         saturations were monitored continuously. The                         Colonoscope was introduced through the anus and  advanced to the the terminal ileum. The colonoscopy                         was performed without difficulty. The patient                         tolerated the procedure well. The quality of the bowel                         preparation was good. The terminal ileum, ileocecal                         valve, appendiceal orifice, and rectum were                         photographed. Findings:      The perianal and digital rectal examinations were normal.      The terminal ileum appeared normal.      A few small-mouthed diverticula were found in the sigmoid colon.      Internal hemorrhoids were found during endoscopy. The hemorrhoids were        Grade I (internal hemorrhoids that do not prolapse).      The exam was otherwise normal throughout the examined colon. Impression:            - The examined portion of the ileum was normal.                        - Diverticulosis in the sigmoid colon.                        - Internal hemorrhoids.                        - No specimens collected. Recommendation:        - Discharge patient to home.                        - Resume previous diet.                        - Continue present medications.                        - Repeat colonoscopy in 10 years for screening                         purposes.                        - Return to referring physician as previously                         scheduled. Procedure Code(s):     --- Professional ---                        [id number], Colorectal cancer screening; colonoscopy on                         individual not meeting criteria for high risk Diagnosis Code(s):     --- Professional ---  Z12.11, Encounter for screening for malignant neoplasm                         of colon                        K64.0, First degree hemorrhoids                        K57.30, Diverticulosis of large intestine without                         perforation or abscess without bleeding CPT copyright 2022 American Medical Association. All rights reserved. The codes documented in this report are preliminary and upon coder review may  be revised to meet current compliance requirements. Eather Colas MD, MD 02/17/2023 2:59:34 PM Number of Addenda: 0 Note Initiated On: 02/17/2023 2:21 PM Scope Withdrawal Time: 0 hours 8 minutes 37 seconds  Total Procedure Duration: 0 hours 11 minutes 21 seconds  Estimated Blood Loss:  Estimated blood loss: none.      [facility]

## 2023-02-17 NOTE — Interval H&P Note (Signed)
History and Physical Interval Note:  02/17/2023 2:16 PM  Monica Gonzalez  has presented today for surgery, with the diagnosis of GERD Colon Cancer Screening.  The various methods of treatment have been discussed with the patient and family. After consideration of risks, benefits and other options for treatment, the patient has consented to  Procedure(s): COLONOSCOPY WITH PROPOFOL (N/A) ESOPHAGOGASTRODUODENOSCOPY (EGD) WITH PROPOFOL (N/A) as a surgical intervention.  The patient's history has been reviewed, patient examined, no change in status, stable for surgery.  I have reviewed the patient's chart and labs.  Questions were answered to the patient's satisfaction.     Regis Bill  Ok to proceed with EGD/Colonoscopy

## 2023-02-17 NOTE — Anesthesia Postprocedure Evaluation (Signed)
Anesthesia Post Note  Patient: Monica Gonzalez  Procedure(s) Performed: COLONOSCOPY WITH PROPOFOL ESOPHAGOGASTRODUODENOSCOPY (EGD) WITH PROPOFOL  Patient location during evaluation: PACU Anesthesia Type: General Level of consciousness: awake and alert, oriented and patient cooperative Pain management: pain level controlled Vital Signs Assessment: post-procedure vital signs reviewed and stable Respiratory status: spontaneous breathing, nonlabored ventilation and respiratory function stable Cardiovascular status: blood pressure returned to baseline and stable Postop Assessment: adequate PO intake Anesthetic complications: no   No notable events documented.   Last Vitals:  Vitals:   02/17/23 1403 02/17/23 1450  BP: 122/70 102/69  Pulse: 64   Resp: 18 20  Temp: (!) 35.6 C 36.8 C  SpO2: 100% 98%    Last Pain:  Vitals:   02/17/23 1450  TempSrc: Temporal  PainSc: 0-No pain                 Reed Breech

## 2023-02-20 ENCOUNTER — Encounter: Payer: Self-pay | Admitting: Gastroenterology

## 2023-02-21 LAB — SURGICAL PATHOLOGY

## 2023-03-16 ENCOUNTER — Other Ambulatory Visit: Payer: Self-pay

## 2023-03-16 ENCOUNTER — Inpatient Hospital Stay
Admission: RE | Admit: 2023-03-16 | Discharge: 2023-03-16 | Disposition: A | Discharge status: Home / Self Care | Payer: Self-pay | Source: Ambulatory Visit | Attending: Orthopedic Surgery | Admitting: Orthopedic Surgery

## 2023-03-16 DIAGNOSIS — Z049 Encounter for examination and observation for unspecified reason: Secondary | ICD-10-CM

## 2023-03-21 ENCOUNTER — Encounter: Payer: Self-pay | Admitting: Gastroenterology

## 2023-03-24 NOTE — Progress Notes (Deleted)
Referring Physician:  Kennedy Bucker, MD [address] 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   Rheumatology referred her to pain management for FM.   Has seen PMR Monica Gonzalez) for neck and left arm pain. She has anaphylactic reaction to prednisone, so Dr. Mariah Gonzalez did not recommend cervical ESI. She ordered PT for her cervical spine.       Duration: *** Location: *** Quality: *** Severity: ***  Precipitating: aggravated by *** Modifying factors: made better by *** Weakness: none Timing: *** Bowel/Bladder Dysfunction: none  Quit smoking in 2018.   Conservative measures:  Physical therapy: ordered by Dr. Mariah Gonzalez on 02/01/23***  Multimodal medical therapy including regular antiinflammatories: celebrex, oxycodone, neurontin, motrin  Injections: *** epidural steroid injections  Past Surgery: ***  Monica Gonzalez has ***no symptoms of cervical myelopathy.  The symptoms are causing a significant impact on the patient's life.   Review of Systems:  A 10 point review of systems is negative, except for the pertinent positives and negatives detailed in the HPI.  Past Medical History: Past Medical History:  Diagnosis Date   Anemia    Asthma    Fibromyalgia    GERD (gastroesophageal reflux disease)    Heart murmur    History of hiatal hernia    Lupus (HCC)     Past Surgical History: Past Surgical History:  Procedure Laterality Date   COLONOSCOPY WITH PROPOFOL N/A 02/17/2023   Procedure: COLONOSCOPY WITH PROPOFOL;  Surgeon: Regis Bill, MD;  Location: [facility] ENDOSCOPY;  Service: Endoscopy;  Laterality: N/A;   ESOPHAGOGASTRODUODENOSCOPY (EGD) WITH PROPOFOL N/A 02/17/2023   Procedure: ESOPHAGOGASTRODUODENOSCOPY (EGD) WITH PROPOFOL;  Surgeon: Regis Bill, MD;  Location: [facility] ENDOSCOPY;   Service: Endoscopy;  Laterality: N/A;   TONSILLECTOMY     TUBAL LIGATION     VULVECTOMY N/A 07/29/2020   Procedure: WIDE EXCISION VULVECTOMY;  Surgeon: Leida Lauth, MD;  Location: [facility] ORS;  Service: Gynecology;  Laterality: N/A;    Allergies: Allergies as of 03/29/2023 - Review Complete 02/17/2023  Allergen Reaction Noted   Prednisone Anaphylaxis 05/03/2015    Medications: Outpatient Encounter Medications as of 03/29/2023  Medication Sig   albuterol (VENTOLIN HFA) 108 (90 Base) MCG/ACT inhaler Inhale 2 puffs into the lungs every 6 (six) hours as needed for wheezing or shortness of breath.   amoxicillin-clavulanate (AUGMENTIN) 875-125 MG tablet Take 1 tablet by mouth 2 (two) times daily. (Patient not taking: Reported on 02/17/2023)   Ascorbic Acid (VITAMIN C) 500 MG CAPS Take 1 tablet by mouth daily.   benzonatate (TESSALON PERLES) 100 MG capsule Take 1 capsule (100 mg total) by mouth 3 (three) times daily as needed for cough.   brompheniramine-pseudoephedrine-DM 30-2-10 MG/5ML syrup Take 10 mLs by mouth 4 (four) times daily as needed.   chlorpheniramine-HYDROcodone (TUSSIONEX) 10-8 MG/5ML Take 5 mLs by mouth every 12 (twelve) hours as needed for cough.   citalopram (CELEXA) 20 MG tablet Take 20 mg by mouth daily.   cyclobenzaprine (FLEXERIL) 10 MG tablet Take 1 tablet (10 mg total) by mouth 3 (three) times daily as needed.   famotidine (PEPCID) 40 MG tablet Take 40 mg by mouth daily.   felodipine (PLENDIL) 2.5 MG 24 hr tablet Take 2.5 mg by mouth daily.   ferrous sulfate 325 (65 FE) MG tablet Take 325 mg by mouth  daily.    gabapentin (NEURONTIN) 300 MG capsule Take 300 mg by mouth 3 (three) times daily.   HYDROcodone-acetaminophen (NORCO/VICODIN) 5-325 MG tablet Take 1 tablet by mouth every 6 (six) hours as needed for moderate pain.   ibuprofen (ADVIL) 600 MG tablet Take 1 tablet (600 mg total) by mouth every 6 (six) hours as needed. (Patient taking differently: Take 600 mg by mouth  every 6 (six) hours as needed (PAIN.).)   ipratropium-albuterol (DUONEB) 0.5-2.5 (3) MG/3ML SOLN Take 3 mLs by nebulization every 4 (four) hours as needed.   ketorolac (TORADOL) 10 MG tablet Take 1 tablet (10 mg total) by mouth every 6 (six) hours as needed.   methocarbamol (ROBAXIN) 500 MG tablet Take 1 tablet (500 mg total) by mouth 4 (four) times daily. (Patient not taking: Reported on 02/17/2023)   ondansetron (ZOFRAN-ODT) 4 MG disintegrating tablet Take 1 tablet (4 mg total) by mouth every 8 (eight) hours as needed for nausea or vomiting.   pantoprazole (PROTONIX) 40 MG tablet Take 40 mg by mouth 2 (two) times daily.   polyethylene glycol (MIRALAX) 17 g packet Take 17 g by mouth daily.   No facility-administered encounter medications on file as of 03/29/2023.    Social History: Social History   Tobacco Use   Smoking status: Never   Smokeless tobacco: Never  Vaping Use   Vaping Use: Never used  Substance Use Topics   Alcohol use: Not Currently   Drug use: No    Family Medical History: Family History  Problem Relation Age of Onset   Diabetes Mother    Congestive Heart Failure Mother     Physical Examination: There were no vitals filed for this visit.  General: Patient is well developed, well nourished, calm, collected, and in no apparent distress. Attention to examination is appropriate.  Respiratory: Patient is breathing without any difficulty.   NEUROLOGICAL:     Awake, alert, oriented to person, place, and time.  Speech is clear and fluent. Fund of knowledge is appropriate.   Cranial Nerves: Pupils equal round and reactive to light.  Facial tone is symmetric.    *** ROM of cervical spine *** pain *** posterior cervical tenderness. *** tenderness in bilateral trapezial region.   *** ROM of lumbar spine *** pain *** posterior lumbar tenderness.   No abnormal lesions on exposed skin.   Strength: Side Biceps Triceps Deltoid Interossei Grip Wrist Ext. Wrist Flex.   R 5 5 5 5 5 5 5   L 5 5 5 5 5 5 5    Side Iliopsoas Quads Hamstring PF DF EHL  R 5 5 5 5 5 5   L 5 5 5 5 5 5    Reflexes are ***2+ and symmetric at the biceps, triceps, brachioradialis, patella and achilles.   Hoffman's is absent.  Clonus is not present.   Bilateral upper and lower extremity sensation is intact to light touch.     Gait is normal.   ***No difficulty with tandem gait.    Medical Decision Making  Imaging: MRI of cervical spine dated 01/16/23:  FINDINGS: Alignment: Straightening of the normal cervical lordosis with new trace retrolisthesis at C4-C5 and C5-C6.   Vertebrae: No fracture, evidence of discitis, or bone lesion.   Cord: Normal signal and morphology.   Posterior Fossa, vertebral arteries, paraspinal tissues: Negative.   Disc levels:   C2-C3:  Negative.   C3-C4: Tiny shallow broad-based central disc protrusion with minimal endplate spurring. No stenosis.   C4-C5: Small broad-based posterior  disc osteophyte complex and moderate bilateral uncovertebral hypertrophy. Mild spinal canal stenosis. Severe left and moderate right neuroforaminal stenosis.   C5-C6: Small broad-based posterior disc osteophyte complex eccentric to the left. Moderate left and mild right uncovertebral hypertrophy. Mild-to-moderate spinal canal stenosis. Severe left and moderate right neuroforaminal stenosis.   C6-C7:  Negative.   C7-T1:  Negative disc.  Mild right facet arthropathy.  No stenosis.   IMPRESSION: 1. Multilevel degenerative changes of the cervical spine as described above, worst at C4-C5 and C5-C6, where there is severe left neuroforaminal stenosis.     Electronically Signed   By: Obie Dredge M.D.   On: 01/16/2023 11:44  I have personally reviewed the images and agree with the above interpretation.   Cervical xrays dated 12/30/22:  Mild/moderate cervical spondylosis and DDD C4-C6.   Radiology report not available for above xrays.   Assessment and  Plan: Ms. Dejongh is a pleasant 47 y.o. female has ***  Treatment options discussed with patient and following plan made:   - Order for physical therapy for *** spine ***. Patient to call to schedule appointment. *** - Continue current medications including ***. Reviewed dosing and side effects.  - Prescription for ***. Reviewed dosing and side effects. Take with food.  - Prescription for *** to take prn muscle spasms. Reviewed dosing and side effects. Discussed this can cause drowsiness.  - MRI of *** to further evaluate *** radiculopathy. No improvement time or medications (***).  - Referral to PMR at [facility] to discuss possible *** injections.  - Will schedule phone visit to review MRI results once I get them back.   I spent a total of *** minutes in face-to-face and non-face-to-face activities related to this patient's care today including review of outside records, review of imaging, review of symptoms, physical exam, discussion of differential diagnosis, discussion of treatment options, and documentation.   Thank you for involving me in the care of this patient.   Drake Leach PA-C Dept. of Neurosurgery

## 2023-03-29 ENCOUNTER — Ambulatory Visit: Payer: BLUE CROSS/BLUE SHIELD | Admitting: Orthopedic Surgery

## 2023-04-12 ENCOUNTER — Encounter: Payer: Self-pay | Admitting: Sports Medicine

## 2023-04-12 ENCOUNTER — Other Ambulatory Visit: Payer: Self-pay | Admitting: Infectious Diseases

## 2023-04-12 DIAGNOSIS — R519 Headache, unspecified: Secondary | ICD-10-CM

## 2023-04-12 NOTE — Progress Notes (Deleted)
Referring Physician:  Kennedy Bucker, MD [address] [facility] [place],  [place] [address]  Primary Physician:  Mick Sell, MD  History of Present Illness: 04/12/2023*** Ms. 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   Rheumatology referred her to pain management for FM.   Has seen PMR Mariah Milling) for neck and left arm pain. She has anaphylactic reaction to prednisone, so Dr. Mariah Milling did not recommend cervical ESI. She ordered PT for her cervical spine.       Duration: *** Location: *** Quality: *** Severity: ***  Precipitating: aggravated by *** Modifying factors: made better by *** Weakness: none Timing: *** Bowel/Bladder Dysfunction: none  Quit smoking in 2018.   Conservative measures:  Physical therapy: ordered by Dr. Mariah Milling on 02/01/23***  Multimodal medical therapy including regular antiinflammatories: celebrex, oxycodone, neurontin, motrin  Injections: *** epidural steroid injections  Past Surgery: ***  Evangelyne M Ebeling has ***no symptoms of cervical myelopathy.  The symptoms are causing a significant impact on the patient's life.   Review of Systems:  A 10 point review of systems is negative, except for the pertinent positives and negatives detailed in the HPI.  Past Medical History: Past Medical History:  Diagnosis Date   Anemia    Asthma    Fibromyalgia    GERD (gastroesophageal reflux disease)    Heart murmur    History of hiatal hernia    Lupus (HCC)     Past Surgical History: Past Surgical History:  Procedure Laterality Date   COLONOSCOPY WITH PROPOFOL N/A 02/17/2023   Procedure: COLONOSCOPY WITH PROPOFOL;  Surgeon: Regis Bill, MD;  Location: [facility] ENDOSCOPY;  Service: Endoscopy;  Laterality: N/A;   ESOPHAGOGASTRODUODENOSCOPY (EGD) WITH PROPOFOL N/A 02/17/2023   Procedure: ESOPHAGOGASTRODUODENOSCOPY (EGD) WITH PROPOFOL;  Surgeon: Regis Bill, MD;  Location: [facility] ENDOSCOPY;   Service: Endoscopy;  Laterality: N/A;   TONSILLECTOMY     TUBAL LIGATION     VULVECTOMY N/A 07/29/2020   Procedure: WIDE EXCISION VULVECTOMY;  Surgeon: Leida Lauth, MD;  Location: [facility] ORS;  Service: Gynecology;  Laterality: N/A;    Allergies: Allergies as of 04/24/2023 - Review Complete 02/17/2023  Allergen Reaction Noted   Prednisone Anaphylaxis 05/03/2015    Medications: Outpatient Encounter Medications as of 04/24/2023  Medication Sig   albuterol (VENTOLIN HFA) 108 (90 Base) MCG/ACT inhaler Inhale 2 puffs into the lungs every 6 (six) hours as needed for wheezing or shortness of breath.   amoxicillin-clavulanate (AUGMENTIN) 875-125 MG tablet Take 1 tablet by mouth 2 (two) times daily. (Patient not taking: Reported on 02/17/2023)   Ascorbic Acid (VITAMIN C) 500 MG CAPS Take 1 tablet by mouth daily.   benzonatate (TESSALON PERLES) 100 MG capsule Take 1 capsule (100 mg total) by mouth 3 (three) times daily as needed for cough.   brompheniramine-pseudoephedrine-DM 30-2-10 MG/5ML syrup Take 10 mLs by mouth 4 (four) times daily as needed.   chlorpheniramine-HYDROcodone (TUSSIONEX) 10-8 MG/5ML Take 5 mLs by mouth every 12 (twelve) hours as needed for cough.   citalopram (CELEXA) 20 MG tablet Take 20 mg by mouth daily.   cyclobenzaprine (FLEXERIL) 10 MG tablet Take 1 tablet (10 mg total) by mouth 3 (three) times daily as needed.   famotidine (PEPCID) 40 MG tablet Take 40 mg by mouth daily.   felodipine (PLENDIL) 2.5 MG 24 hr tablet Take 2.5 mg by mouth daily.   ferrous sulfate 325 (65 FE) MG tablet Take 325 mg by mouth  daily.    gabapentin (NEURONTIN) 300 MG capsule Take 300 mg by mouth 3 (three) times daily.   HYDROcodone-acetaminophen (NORCO/VICODIN) 5-325 MG tablet Take 1 tablet by mouth every 6 (six) hours as needed for moderate pain.   ibuprofen (ADVIL) 600 MG tablet Take 1 tablet (600 mg total) by mouth every 6 (six) hours as needed. (Patient taking differently: Take 600 mg by mouth  every 6 (six) hours as needed (PAIN.).)   ipratropium-albuterol (DUONEB) 0.5-2.5 (3) MG/3ML SOLN Take 3 mLs by nebulization every 4 (four) hours as needed.   ketorolac (TORADOL) 10 MG tablet Take 1 tablet (10 mg total) by mouth every 6 (six) hours as needed.   methocarbamol (ROBAXIN) 500 MG tablet Take 1 tablet (500 mg total) by mouth 4 (four) times daily. (Patient not taking: Reported on 02/17/2023)   ondansetron (ZOFRAN-ODT) 4 MG disintegrating tablet Take 1 tablet (4 mg total) by mouth every 8 (eight) hours as needed for nausea or vomiting.   pantoprazole (PROTONIX) 40 MG tablet Take 40 mg by mouth 2 (two) times daily.   polyethylene glycol (MIRALAX) 17 g packet Take 17 g by mouth daily.   No facility-administered encounter medications on file as of 04/24/2023.    Social History: Social History   Tobacco Use   Smoking status: Never   Smokeless tobacco: Never  Vaping Use   Vaping Use: Never used  Substance Use Topics   Alcohol use: Not Currently   Drug use: No    Family Medical History: Family History  Problem Relation Age of Onset   Diabetes Mother    Congestive Heart Failure Mother     Physical Examination: There were no vitals filed for this visit.  General: Patient is well developed, well nourished, calm, collected, and in no apparent distress. Attention to examination is appropriate.  Respiratory: Patient is breathing without any difficulty.   NEUROLOGICAL:     Awake, alert, oriented to person, place, and time.  Speech is clear and fluent. Fund of knowledge is appropriate.   Cranial Nerves: Pupils equal round and reactive to light.  Facial tone is symmetric.    *** ROM of cervical spine *** pain *** posterior cervical tenderness. *** tenderness in bilateral trapezial region.   *** ROM of lumbar spine *** pain *** posterior lumbar tenderness.   No abnormal lesions on exposed skin.   Strength: Side Biceps Triceps Deltoid Interossei Grip Wrist Ext. Wrist Flex.   R 5 5 5 5 5 5 5   L 5 5 5 5 5 5 5    Side Iliopsoas Quads Hamstring PF DF EHL  R 5 5 5 5 5 5   L 5 5 5 5 5 5    Reflexes are ***2+ and symmetric at the biceps, triceps, brachioradialis, patella and achilles.   Hoffman's is absent.  Clonus is not present.   Bilateral upper and lower extremity sensation is intact to light touch.     Gait is normal.   ***No difficulty with tandem gait.    Medical Decision Making  Imaging: MRI of cervical spine dated 01/16/23:  FINDINGS: Alignment: Straightening of the normal cervical lordosis with new trace retrolisthesis at C4-C5 and C5-C6.   Vertebrae: No fracture, evidence of discitis, or bone lesion.   Cord: Normal signal and morphology.   Posterior Fossa, vertebral arteries, paraspinal tissues: Negative.   Disc levels:   C2-C3:  Negative.   C3-C4: Tiny shallow broad-based central disc protrusion with minimal endplate spurring. No stenosis.   C4-C5: Small broad-based posterior  disc osteophyte complex and moderate bilateral uncovertebral hypertrophy. Mild spinal canal stenosis. Severe left and moderate right neuroforaminal stenosis.   C5-C6: Small broad-based posterior disc osteophyte complex eccentric to the left. Moderate left and mild right uncovertebral hypertrophy. Mild-to-moderate spinal canal stenosis. Severe left and moderate right neuroforaminal stenosis.   C6-C7:  Negative.   C7-T1:  Negative disc.  Mild right facet arthropathy.  No stenosis.   IMPRESSION: 1. Multilevel degenerative changes of the cervical spine as described above, worst at C4-C5 and C5-C6, where there is severe left neuroforaminal stenosis.     Electronically Signed   By: Obie Dredge M.D.   On: 01/16/2023 11:44  I have personally reviewed the images and agree with the above interpretation.   Cervical xrays dated 12/30/22:  Mild/moderate cervical spondylosis and DDD C4-C6.   Radiology report not available for above xrays.   Assessment and  Plan: Ms. Gisi is a pleasant 47 y.o. female has ***  Treatment options discussed with patient and following plan made:   - Order for physical therapy for *** spine ***. Patient to call to schedule appointment. *** - Continue current medications including ***. Reviewed dosing and side effects.  - Prescription for ***. Reviewed dosing and side effects. Take with food.  - Prescription for *** to take prn muscle spasms. Reviewed dosing and side effects. Discussed this can cause drowsiness.  - MRI of *** to further evaluate *** radiculopathy. No improvement time or medications (***).  - Referral to [facility] at [facility] to discuss possible *** injections.  - Will schedule phone visit to review MRI results once I get them back.   I spent a total of *** minutes in face-to-face and non-face-to-face activities related to this patient's care today including review of outside records, review of imaging, review of symptoms, physical exam, discussion of differential diagnosis, discussion of treatment options, and documentation.   Thank you for involving me in the care of this patient.   Drake Leach PA-C Dept. of Neurosurgery

## 2023-04-18 ENCOUNTER — Ambulatory Visit
Admission: RE | Admit: 2023-04-18 | Discharge: 2023-04-18 | Disposition: A | Discharge status: Home / Self Care | Payer: 59 | Source: Ambulatory Visit | Attending: Infectious Diseases | Admitting: Infectious Diseases

## 2023-04-18 DIAGNOSIS — R519 Headache, unspecified: Secondary | ICD-10-CM

## 2023-04-24 ENCOUNTER — Ambulatory Visit: Payer: BLUE CROSS/BLUE SHIELD | Admitting: Orthopedic Surgery

## 2023-05-21 DIAGNOSIS — G894 Chronic pain syndrome: Secondary | ICD-10-CM

## 2023-05-21 DIAGNOSIS — Z79899 Other long term (current) drug therapy: Secondary | ICD-10-CM | POA: Insufficient documentation

## 2023-05-21 DIAGNOSIS — Z789 Other specified health status: Secondary | ICD-10-CM | POA: Insufficient documentation

## 2023-05-21 DIAGNOSIS — M899 Disorder of bone, unspecified: Secondary | ICD-10-CM | POA: Insufficient documentation

## 2023-05-21 HISTORY — DX: Chronic pain syndrome: G89.4

## 2023-05-21 NOTE — Progress Notes (Unsigned)
Patient: Monica Gonzalez  Service Category: E/M  Provider: Oswaldo Done, MD  DOB: September 12, 1976  DOS: 05/22/2023  Referring Provider: Defoor, Allen Derry  MRN: [id number]  Setting: Ambulatory outpatient  PCP: Mick Sell, MD  Type: New Patient  Specialty: Interventional Pain Management    Location: Office  Delivery: Face-to-face     Primary Reason(s) for Visit: Encounter for initial evaluation of one or more chronic problems (new to examiner) potentially causing chronic pain, and posing a threat to normal musculoskeletal function. (Level of risk: High) CC: No chief complaint on file.  HPI  Monica Gonzalez is a 47 y.o. year old, female patient, who comes for the first time to our practice referred by Defoor, Dionne Ano, PA-C for our initial evaluation of her chronic pain. She has VIN III (vulvar intraepithelial neoplasia III); Asthma; Atypical chest pain; Cellulitis of right lower extremity; Colles' fracture; Family history of premature CAD; Fibromyalgia; Hiatal hernia; History of heart murmur in childhood; Lupus (systemic lupus erythematosus) (HCC); Palpitations; Chronic pain syndrome; Pharmacologic therapy; Disorder of skeletal system; and Problems influencing health status on their problem list. Today she comes in for evaluation of her No chief complaint on file.  Pain Assessment: Location:     Radiating:   Onset:   Duration:   Quality:   Severity:  /10 (subjective, self-reported pain score)  Effect on ADL:   Timing:   Modifying factors:   BP:    HR:    Onset and Duration: {Hx; Onset and Duration:210120511} Cause of pain: {Hx; Cause:210120521} Severity: {Pain Severity:210120502} Timing: {Symptoms; Timing:210120501} Aggravating Factors: {Causes; Aggravating pain factors:210120507} Alleviating Factors: {Causes; Alleviating Factors:210120500} Associated Problems: {Hx; Associated problems:210120515} Quality of Pain: {Hx; Symptom quality or Descriptor:210120531} Previous Examinations  or Tests: {Hx; Previous examinations or test:210120529} Previous Treatments: {Hx; Previous Treatment:210120503}  Monica Gonzalez is being evaluated for possible interventional pain management therapies for the treatment of her chronic pain.   ***  Monica Gonzalez has been informed that this initial visit was an evaluation only.  On the follow up appointment I will go over the results, including ordered tests and available interventional therapies. At that time she will have the opportunity to decide whether to proceed with offered therapies or not. In the event that Monica Gonzalez prefers avoiding interventional options, this will conclude our involvement in the case.  Medication management recommendations may be provided upon request.  Historic Controlled Substance Pharmacotherapy Review  PMP and historical list of controlled substances: ***  Most recently prescribed opioid analgesics:   *** MME/day: *** mg/day  Historical Monitoring: The patient  reports no history of drug use. List of prior UDS Testing: No results found for: "MDMA", "COCAINSCRNUR", "PCPSCRNUR", "PCPQUANT", "CANNABQUANT", "THCU", "ETH", "CBDTHCR", "D8THCCBX", "D9THCCBX" Historical Background Evaluation: [place] PMP: PDMP reviewed during this encounter. Review of the past 50-months conducted.             PMP NARX Score Report:  Narcotic: 190 Sedative: 221 Stimulant: 000  Department of public safety, offender search: Engineer, mining Information) Non-contributory Risk Assessment Profile: Aberrant behavior: None observed or detected today Risk factors for fatal opioid overdose: None identified today PMP NARX Overdose Risk Score: 310 Fatal overdose hazard ratio (HR): Calculation deferred Non-fatal overdose hazard ratio (HR): Calculation deferred Risk of opioid abuse or dependence: 0.7-3.0% with doses ? 36 MME/day and 6.1-26% with doses ? 120 MME/day. Substance use disorder (SUD) risk level: See below Personal History of Substance Abuse (SUD-Substance  use disorder):  Alcohol:    Illegal Drugs:  Rx Drugs:    ORT Risk Level calculation:    ORT Scoring interpretation table:  Score <3 = Low Risk for SUD  Score between 4-7 = Moderate Risk for SUD  Score >8 = High Risk for Opioid Abuse   PHQ-2 Depression Scale:  Total score:    PHQ-2 Scoring interpretation table: (Score and probability of major depressive disorder)  Score 0 = No depression  Score 1 = 15.4% Probability  Score 2 = 21.1% Probability  Score 3 = 38.4% Probability  Score 4 = 45.5% Probability  Score 5 = 56.4% Probability  Score 6 = 78.6% Probability   PHQ-9 Depression Scale:  Total score:    PHQ-9 Scoring interpretation table:  Score 0-4 = No depression  Score 5-9 = Mild depression  Score 10-14 = Moderate depression  Score 15-19 = Moderately severe depression  Score 20-27 = Severe depression (2.4 times higher risk of SUD and 2.89 times higher risk of overuse)   Pharmacologic Plan: As per protocol, I have not taken over any controlled substance management, pending the results of ordered tests and/or consults.            Initial impression: Pending review of available data and ordered tests.  Meds   Current Outpatient Medications:    albuterol (PROVENTIL) (2.5 MG/3ML) 0.083% nebulizer solution, Inhale into the lungs., Disp: , Rfl:    albuterol (VENTOLIN HFA) 108 (90 Base) MCG/ACT inhaler, Inhale into the lungs., Disp: , Rfl:    celecoxib (CELEBREX) 200 MG capsule, Take by mouth., Disp: , Rfl:    clonazePAM (KLONOPIN) 0.5 MG tablet, Take by mouth., Disp: , Rfl:    famotidine (PEPCID) 40 MG tablet, Take 1 tablet by mouth at bedtime., Disp: , Rfl:    Fluticasone-Umeclidin-Vilant (TRELEGY ELLIPTA) 200-62.5-25 MCG/ACT AEPB, Inhale into the lungs., Disp: , Rfl:    gabapentin (NEURONTIN) 300 MG capsule, Take 2 capsules by mouth 2 (two) times daily., Disp: , Rfl:    ibuprofen (ADVIL) 800 MG tablet, Take by mouth., Disp: , Rfl:    methylPREDNISolone (MEDROL DOSEPAK) 4 MG  TBPK tablet, Take 4 mg by mouth daily., Disp: , Rfl:    pantoprazole (PROTONIX) 40 MG tablet, Take by mouth., Disp: , Rfl:    polyethylene glycol powder (GLYCOLAX/MIRALAX) 17 GM/SCOOP powder, Take by mouth., Disp: , Rfl:    polyethylene glycol-electrolytes (NULYTELY) 420 g solution, See admin instructions., Disp: , Rfl:    tranexamic acid (LYSTEDA) 650 MG TABS tablet, Take by mouth., Disp: , Rfl:    valACYclovir (VALTREX) 1000 MG tablet, Take two tabs twice daily for one day., Disp: , Rfl:    albuterol (VENTOLIN HFA) 108 (90 Base) MCG/ACT inhaler, Inhale 2 puffs into the lungs every 6 (six) hours as needed for wheezing or shortness of breath., Disp: 8 g, Rfl: 2   amoxicillin-clavulanate (AUGMENTIN) 875-125 MG tablet, Take 1 tablet by mouth 2 (two) times daily. (Patient not taking: Reported on 02/17/2023), Disp: 20 tablet, Rfl: 0   Ascorbic Acid (VITAMIN C) 500 MG CAPS, Take 1 tablet by mouth daily., Disp: , Rfl:    benzonatate (TESSALON PERLES) 100 MG capsule, Take 1 capsule (100 mg total) by mouth 3 (three) times daily as needed for cough., Disp: 30 capsule, Rfl: 0   brompheniramine-pseudoephedrine-DM 30-2-10 MG/5ML syrup, Take 10 mLs by mouth 4 (four) times daily as needed., Disp: 200 mL, Rfl: 0   chlorpheniramine-HYDROcodone (TUSSIONEX) 10-8 MG/5ML, Take 5 mLs by mouth every 12 (twelve) hours as needed for cough., Disp: 70  mL, Rfl: 0   citalopram (CELEXA) 20 MG tablet, Take 20 mg by mouth daily., Disp: , Rfl:    cyclobenzaprine (FLEXERIL) 10 MG tablet, Take 1 tablet (10 mg total) by mouth 3 (three) times daily as needed., Disp: 30 tablet, Rfl: 0   famotidine (PEPCID) 40 MG tablet, Take 40 mg by mouth daily., Disp: , Rfl:    felodipine (PLENDIL) 2.5 MG 24 hr tablet, Take 2.5 mg by mouth daily., Disp: , Rfl:    ferrous sulfate 325 (65 FE) MG tablet, Take 325 mg by mouth daily. , Disp: , Rfl:    gabapentin (NEURONTIN) 300 MG capsule, Take 300 mg by mouth 3 (three) times daily., Disp: , Rfl:     HYDROcodone-acetaminophen (NORCO/VICODIN) 5-325 MG tablet, Take 1 tablet by mouth every 6 (six) hours as needed for moderate pain., Disp: , Rfl:    ibuprofen (ADVIL) 600 MG tablet, Take 1 tablet (600 mg total) by mouth every 6 (six) hours as needed. (Patient taking differently: Take 600 mg by mouth every 6 (six) hours as needed (PAIN.).), Disp: 30 tablet, Rfl: 0   ipratropium-albuterol (DUONEB) 0.5-2.5 (3) MG/3ML SOLN, Take 3 mLs by nebulization every 4 (four) hours as needed., Disp: 360 mL, Rfl: 0   ketorolac (TORADOL) 10 MG tablet, Take 1 tablet (10 mg total) by mouth every 6 (six) hours as needed., Disp: 20 tablet, Rfl: 0   methocarbamol (ROBAXIN) 500 MG tablet, Take 1 tablet (500 mg total) by mouth 4 (four) times daily. (Patient not taking: Reported on 02/17/2023), Disp: 20 tablet, Rfl: 0   ondansetron (ZOFRAN-ODT) 4 MG disintegrating tablet, Take 1 tablet (4 mg total) by mouth every 8 (eight) hours as needed for nausea or vomiting., Disp: 15 tablet, Rfl: 0   pantoprazole (PROTONIX) 40 MG tablet, Take 40 mg by mouth 2 (two) times daily., Disp: , Rfl:    polyethylene glycol (MIRALAX) 17 g packet, Take 17 g by mouth daily., Disp: 14 each, Rfl: 0  Imaging Review  Cervical Imaging: Cervical MR wo contrast: Results for orders placed during the hospital encounter of 01/16/23 MR CERVICAL SPINE WO CONTRAST  Narrative CLINICAL DATA:  Neck pain radiating into the left shoulder for the past 6 months. Left arm numbness and weakness. No prior surgery.  EXAM: MRI CERVICAL SPINE WITHOUT CONTRAST  TECHNIQUE: Multiplanar, multisequence MR imaging of the cervical spine was performed. No intravenous contrast was administered.  COMPARISON:  CT cervical spine dated Apr 12, 2016.  FINDINGS: Alignment: Straightening of the normal cervical lordosis with new trace retrolisthesis at C4-C5 and C5-C6.  Vertebrae: No fracture, evidence of discitis, or bone lesion.  Cord: Normal signal and  morphology.  Posterior Fossa, vertebral arteries, paraspinal tissues: Negative.  Disc levels:  C2-C3:  Negative.  C3-C4: Tiny shallow broad-based central disc protrusion with minimal endplate spurring. No stenosis.  C4-C5: Small broad-based posterior disc osteophyte complex and moderate bilateral uncovertebral hypertrophy. Mild spinal canal stenosis. Severe left and moderate right neuroforaminal stenosis.  C5-C6: Small broad-based posterior disc osteophyte complex eccentric to the left. Moderate left and mild right uncovertebral hypertrophy. Mild-to-moderate spinal canal stenosis. Severe left and moderate right neuroforaminal stenosis.  C6-C7:  Negative.  C7-T1:  Negative disc.  Mild right facet arthropathy.  No stenosis.  IMPRESSION: 1. Multilevel degenerative changes of the cervical spine as described above, worst at C4-C5 and C5-C6, where there is severe left neuroforaminal stenosis.   Electronically Signed By: Obie Dredge M.D. On: 01/16/2023 11:44  Cervical CT wo contrast: Results  for orders placed during the hospital encounter of 04/12/16 CT Cervical Spine Wo Contrast  Narrative CLINICAL DATA:  Initial evaluation for recent fall, acute left-sided facial swelling with left arm pain.  EXAM: CT HEAD WITHOUT CONTRAST  CT MAXILLOFACIAL WITHOUT CONTRAST  CT CERVICAL SPINE WITHOUT CONTRAST  TECHNIQUE: Multidetector CT imaging of the head, cervical spine, and maxillofacial structures were performed using the standard protocol without intravenous contrast. Multiplanar CT image reconstructions of the cervical spine and maxillofacial structures were also generated.  COMPARISON:  None.  FINDINGS: CT HEAD FINDINGS  There is no acute intracranial hemorrhage or infarct. No mass lesion or midline shift. Gray-white matter differentiation is well maintained. Ventricles are normal in size without evidence of hydrocephalus. CSF containing spaces are within normal  limits. No extra-axial fluid collection.  The calvarium is intact.  Orbital soft tissues are within normal limits.  The paranasal sinuses and mastoid air cells are well pneumatized and free of fluid.  Scalp soft tissues are unremarkable.  CT MAXILLOFACIAL FINDINGS  Left facial contusion present. No other significant soft tissue swelling within the face.  Globes intact. No retro-orbital hematoma or other pathology. Bony orbits intact without evidence orbital floor fracture.  Zygomatic arches intact. No acute maxillary fracture. Pterygoid plates intact. No acute nasal bone fracture. Nasal septum midline and intact.  Mandible intact. Mandibular condyles normally situated. No acute abnormality about the dentition. Few scattered dental caries noted  Paranasal sinuses are clear.  CT CERVICAL SPINE FINDINGS  Reversal of the normal cervical lordosis, which may related to positioning or muscular spasm. Vertebral body heights are preserved. Normal C1-2 articulations are intact. No prevertebral soft tissue swelling. No acute fracture or listhesis. Osseous excrescence arising from the left posterior arch of C1 noted, likely chronic.  Mild degenerative spondylolysis at C4-5 and C5-6.  Visualized soft tissues of the neck are within normal limits. Visualized lung apices are clear without evidence of apical pneumothorax.  IMPRESSION: CT HEAD:  Negative head CT.  No acute intracranial process identified.  CT MAXILLOFACIAL:  1. No acute maxillofacial fracture. 2. Left facial contusion.  CT CERVICAL SPINE:  1. No acute traumatic injury within the cervical spine. 2. Mild degenerative spondylolysis at C4-5 and C5-6.   Electronically Signed By: Rise Mu M.D. On: 04/12/2016 01:10  Shoulder Imaging: Shoulder-L DG: Results for orders placed during the hospital encounter of 04/13/16 DG Shoulder Left  Narrative CLINICAL DATA:  Left shoulder pain following fall on  stairs, initial encounter  EXAM: LEFT SHOULDER - 2+ VIEW  COMPARISON:  None.  FINDINGS: There is no evidence of fracture or dislocation. There is no evidence of arthropathy or other focal bone abnormality. Soft tissues are unremarkable.  IMPRESSION: No acute abnormality noted.   Electronically Signed By: Alcide Clever M.D. On: 04/13/2016 11:45  Lumbosacral Imaging: Lumbar DG 2-3 views: Results for orders placed during the hospital encounter of 06/16/20 DG Lumbar Spine 2-3 Views  Narrative CLINICAL DATA:  Back pain  EXAM: LUMBAR SPINE - 2-3 VIEW  COMPARISON:  None.  FINDINGS: There is no evidence of lumbar spine fracture. Alignment is normal. Intervertebral disc spaces are maintained. No significant facet hypertrophy.  IMPRESSION: Negative.   Electronically Signed By: Guadlupe Spanish M.D. On: 06/16/2020 20:43  Lumbar DG (Complete) 4+V: Results for orders placed during the hospital encounter of 05/25/22 DG Lumbar Spine Complete  Narrative CLINICAL DATA:  Low back pain x3 days  EXAM: LUMBAR SPINE - COMPLETE 4+ VIEW  COMPARISON:  CT abdomen/pelvis dated 12/26/2021  FINDINGS: Normal lumbar lordosis.  No evidence of fracture or dislocation. Vertebral body heights and intervertebral disc spaces are maintained.  Visualized bony pelvis appears intact.  IMPRESSION: Negative.   Electronically Signed By: Charline Bills M.D. On: 05/25/2022 22:49  Hip Imaging: Hip-L DG 2-3 views: Results for orders placed during the hospital encounter of 02/18/20 DG Hip Unilat W or Wo Pelvis 2-3 Views Left  Narrative CLINICAL DATA:  Tripped while walking dog today leading to fall. Left hip and knee pain.  EXAM: DG HIP (WITH OR WITHOUT PELVIS) 2-3V LEFT  COMPARISON:  None.  FINDINGS: The cortical margins of the bony pelvis and left hip are intact. No fracture. Pubic symphysis and sacroiliac joints are congruent. Pubic rami are intact. Both femoral heads are  well-seated in the respective acetabula.  IMPRESSION: Negative radiographs of the pelvis and left hip.   Electronically Signed By: Narda Rutherford M.D. On: 02/18/2020 21:40  Knee Imaging: Knee-L DG 4 views: Results for orders placed during the hospital encounter of 02/18/20 DG Knee Complete 4 Views Left  Narrative CLINICAL DATA:  Tripped while walking dog today leading to fall. Left hip and knee pain.  EXAM: LEFT KNEE - COMPLETE 4+ VIEW  COMPARISON:  None.  FINDINGS: No evidence of fracture, dislocation, or joint effusion. No evidence of arthropathy or other focal bone abnormality. Soft tissues are unremarkable.  IMPRESSION: Negative radiographs of the left knee.   Electronically Signed By: Narda Rutherford M.D. On: 02/18/2020 21:41  Wrist Imaging: Wrist-R DG Complete: Results for orders placed during the hospital encounter of 05/03/15 DG Wrist Complete Right  Narrative CLINICAL DATA:  Acute onset of right-sided wrist pain, after fall. Initial encounter.  EXAM: RIGHT WRIST - COMPLETE 3+ VIEW  COMPARISON:  Right wrist radiographs performed 10/05/2014  FINDINGS: There is chronic deformity involving the distal radius, reflecting a healed fracture. No new fractures are seen. A chronically displaced ulnar styloid fragment is noted. Positive ulnar variance is noted.  The carpal rows appear grossly intact, and demonstrate normal alignment. Mild soft tissue swelling is noted about the wrist.  IMPRESSION: No evidence of acute fracture or dislocation. Chronic deformity involving the distal radius, reflecting a healed fracture. Chronically displaced ulnar styloid fragment again noted. Positive ulnar variance seen.   Electronically Signed By: Roanna Raider M.D. On: 05/04/2015 00:52  Hand Imaging: Hand-R DG Complete: Results for orders placed during the hospital encounter of 06/09/15 DG Hand Complete Right  Narrative CLINICAL DATA:  Pain after injury.  Shut hand in car door, now with pain. Pain in the region of the metacarpal phalangeal joint.  EXAM: RIGHT HAND - COMPLETE 3+ VIEW  COMPARISON:  Wrist radiographs 05/03/2015  FINDINGS: Comminuted fracture of the distal ulnar metaphysis. Question early surrounding callus formation. Remote fracture of the ulnar styloid unchanged. Chronic deformity of the distal radius is unchanged. No definite additional fracture of the hand. Artifact noted about the index and middle finger, not repeated per request.  IMPRESSION: 1. Comminuted fracture of the distal ulnar metaphysis. Is question overlies surrounding callus formation, suggesting this may be subacute. 2. No additional acute fracture of the hand.   Electronically Signed By: Rubye Oaks M.D. On: 06/10/2015 01:09  Complexity Note: Imaging results reviewed.                         ROS  Cardiovascular: {Hx; Cardiovascular History:210120525} Pulmonary or Respiratory: {Hx; Pumonary and/or Respiratory History:210120523} Neurological: {Hx; Neurological:210120504} Psychological-Psychiatric: {Hx; Psychological-Psychiatric History:210120512}  Gastrointestinal: {Hx; Gastrointestinal:210120527} Genitourinary: {Hx; Genitourinary:210120506} Hematological: {Hx; Hematological:210120510} Endocrine: {Hx; Endocrine history:210120509} Rheumatologic: {Hx; Rheumatological:210120530} Musculoskeletal: {Hx; Musculoskeletal:210120528} Work History: {Hx; Work history:210120514}  Allergies  Monica Gonzalez is allergic to prednisone.  Laboratory Chemistry Profile   Renal Lab Results  Component Value Date   BUN 8 11/27/2022   CREATININE 0.90 11/27/2022   GFRAA >60 07/27/2020   GFRNONAA >60 11/27/2022   PROTEINUR NEGATIVE 04/15/2022     Electrolytes Lab Results  Component Value Date   NA 137 11/27/2022   K 3.7 11/27/2022   CL 106 11/27/2022   CALCIUM 8.4 (L) 11/27/2022     Hepatic Lab Results  Component Value Date   AST 28 11/27/2022    ALT 24 11/27/2022   ALBUMIN 3.4 (L) 11/27/2022   ALKPHOS 72 11/27/2022   LIPASE 29 12/26/2021     ID Lab Results  Component Value Date   SARSCOV2NAA NEGATIVE 11/27/2022   PREGTESTUR NEGATIVE 02/17/2023     Bone No results found for: "VD25OH", "VD125OH2TOT", "ZO1096EA5", "WU9811BJ4", "25OHVITD1", "25OHVITD2", "25OHVITD3", "TESTOFREE", "TESTOSTERONE"   Endocrine Lab Results  Component Value Date   GLUCOSE 83 11/27/2022   GLUCOSEU NEGATIVE 04/15/2022   TSH 1.091 05/18/2020     Neuropathy No results found for: "VITAMINB12", "FOLATE", "HGBA1C", "HIV"   CNS No results found for: "COLORCSF", "APPEARCSF", "RBCCOUNTCSF", "WBCCSF", "POLYSCSF", "LYMPHSCSF", "EOSCSF", "PROTEINCSF", "GLUCCSF", "JCVIRUS", "CSFOLI", "IGGCSF", "LABACHR", "ACETBL"   Inflammation (CRP: Acute  ESR: Chronic) No results found for: "CRP", "ESRSEDRATE", "LATICACIDVEN"   Rheumatology No results found for: "RF", "ANA", "LABURIC", "URICUR", "LYMEIGGIGMAB", "LYMEABIGMQN", "HLAB27"   Coagulation Lab Results  Component Value Date   PLT 344 11/27/2022     Cardiovascular Lab Results  Component Value Date   TROPONINI <0.03 12/24/2018   HGB 12.8 11/27/2022   HCT 40.8 11/27/2022     Screening Lab Results  Component Value Date   SARSCOV2NAA NEGATIVE 11/27/2022   COVIDSOURCE NASOPHARYNGEAL 05/20/2019   PREGTESTUR NEGATIVE 02/17/2023     Cancer No results found for: "CEA", "CA125", "LABCA2"   Allergens No results found for: "ALMOND", "APPLE", "ASPARAGUS", "AVOCADO", "BANANA", "BARLEY", "BASIL", "BAYLEAF", "GREENBEAN", "LIMABEAN", "WHITEBEAN", "BEEFIGE", "REDBEET", "BLUEBERRY", "BROCCOLI", "CABBAGE", "MELON", "CARROT", "CASEIN", "CASHEWNUT", "CAULIFLOWER", "CELERY"     Note: Lab results reviewed.  PFSH  Drug: Monica Gonzalez  reports no history of drug use. Alcohol:  reports that she does not currently use alcohol. Tobacco:  reports that she has never smoked. She has never used smokeless tobacco. Medical:  has a  past medical history of Anemia, Asthma, Fibromyalgia, GERD (gastroesophageal reflux disease), Heart murmur, History of hiatal hernia, and Lupus (HCC). Family: family history includes Congestive Heart Failure in her mother; Diabetes in her mother.  Past Surgical History:  Procedure Laterality Date   COLONOSCOPY WITH PROPOFOL N/A 02/17/2023   Procedure: COLONOSCOPY WITH PROPOFOL;  Surgeon: Regis Bill, MD;  Location: [facility];  Service: Endoscopy;  Laterality: N/A;   ESOPHAGOGASTRODUODENOSCOPY (EGD) WITH PROPOFOL N/A 02/17/2023   Procedure: ESOPHAGOGASTRODUODENOSCOPY (EGD) WITH PROPOFOL;  Surgeon: Regis Bill, MD;  Location: [facility];  Service: Endoscopy;  Laterality: N/A;   TONSILLECTOMY     TUBAL LIGATION     VULVECTOMY N/A 07/29/2020   Procedure: WIDE EXCISION VULVECTOMY;  Surgeon: Leida Lauth, MD;  Location: [facility];  Service: Gynecology;  Laterality: N/A;   Active Ambulatory Problems    Diagnosis Date Noted   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   Asthma 06/13/2019   Atypical chest pain 06/29/2020   Cellulitis of right lower extremity  11/25/2021   Colles' fracture 12/29/2022   Family history of premature CAD 06/29/2020   Fibromyalgia 02/07/2023   Hiatal hernia 06/13/2019   History of heart murmur in childhood 06/13/2019   Lupus (systemic lupus erythematosus) (HCC) 06/13/2019   Palpitations 07/23/2020   Chronic pain syndrome 05/21/2023   Pharmacologic therapy 05/21/2023   Disorder of skeletal system 05/21/2023   Problems influencing health status 05/21/2023   Resolved Ambulatory Problems    Diagnosis Date Noted   No Resolved Ambulatory Problems   Past Medical History:  Diagnosis Date   Anemia    GERD (gastroesophageal reflux disease)    Heart murmur    History of hiatal hernia    Lupus (HCC)    Constitutional Exam  General appearance: Well nourished, well developed, and well hydrated. In no apparent acute distress There  were no vitals filed for this visit. BMI Assessment: Estimated body mass index is 38.78 kg/m as calculated from the following:   Height as of 02/17/23: 4\' 11"  (1.499 m).   Weight as of 02/17/23: 192 lb (87.1 kg).  BMI interpretation table: BMI level Category Range association with higher incidence of chronic pain  <18 kg/m2 Underweight   18.5-24.9 kg/m2 Ideal body weight   25-29.9 kg/m2 Overweight Increased incidence by 20%  30-34.9 kg/m2 Obese (Class I) Increased incidence by 68%  35-39.9 kg/m2 Severe obesity (Class II) Increased incidence by 136%  >40 kg/m2 Extreme obesity (Class III) Increased incidence by 254%   Patient's current BMI Ideal Body weight  There is no height or weight on file to calculate BMI. Patient weight not recorded   BMI Readings from Last 4 Encounters:  02/17/23 38.78 kg/m  11/27/22 37.77 kg/m  11/08/22 38.58 kg/m  11/06/22 38.58 kg/m   Wt Readings from Last 4 Encounters:  02/17/23 192 lb (87.1 kg)  11/27/22 187 lb (84.8 kg)  11/08/22 191 lb (86.6 kg)  11/06/22 191 lb (86.6 kg)    Psych/Mental status: Alert, oriented x 3 (person, place, & time)       Eyes: PERLA Respiratory: No evidence of acute respiratory distress  Assessment  Primary Diagnosis & Pertinent Problem List: The primary encounter diagnosis was Chronic pain syndrome. Diagnoses of Pharmacologic therapy, Disorder of skeletal system, and Problems influencing health status were also pertinent to this visit.  Visit Diagnosis (New problems to examiner): 1. Chronic pain syndrome   2. Pharmacologic therapy   3. Disorder of skeletal system   4. Problems influencing health status    Plan of Care (Initial workup plan)  Note: Monica Gonzalez was reminded that as per protocol, today's visit has been an evaluation only. We have not taken over the patient's controlled substance management.  Problem-specific plan: No problem-specific Assessment & Plan notes found for this encounter.  Lab Orders  No  laboratory test(s) ordered today   Imaging Orders  No imaging studies ordered today   Referral Orders  No referral(s) requested today   Procedure Orders    No procedure(s) ordered today   Pharmacotherapy (current): Medications ordered:  No orders of the defined types were placed in this encounter.  Medications administered during this visit: Monica Gonzalez had no medications administered during this visit.   Analgesic Pharmacotherapy:  Opioid Analgesics: For patients currently taking or requesting to take opioid analgesics, in accordance with [facility] Guidelines, we will assess their risks and indications for the use of these substances. After completing our evaluation, we may offer recommendations, but we no longer take patients  for medication management. The prescribing physician will ultimately decide, based on his/her training and level of comfort whether to adopt any of the recommendations, including whether or not to prescribe such medicines.  Membrane stabilizer: To be determined at a later time  Muscle relaxant: To be determined at a later time  NSAID: To be determined at a later time  Other analgesic(s): To be determined at a later time   Interventional management options: Monica Gonzalez was informed that there is no guarantee that she would be a candidate for interventional therapies. The decision will be based on the results of diagnostic studies, as well as Monica Gonzalez's risk profile.  Procedure(s) under consideration:  Pending results of ordered studies      Interventional Therapies  Risk Factors  Considerations  Medical Comorbidities:     Planned  Pending:      Under consideration:   Pending   Completed:   None at this time   Therapeutic  Palliative (PRN) options:   None established   Completed by other providers:   None reported       Provider-requested follow-up: No follow-ups on file.  Future Appointments  Date Time Provider  Department Center  05/22/2023  2:00 PM Delano Metz, MD [facility] None    Duration of encounter: *** minutes.  Total time on encounter, as per AMA guidelines included both the face-to-face and non-face-to-face time personally spent by the physician and/or other qualified health care professional(s) on the day of the encounter (includes time in activities that require the physician or other qualified health care professional and does not include time in activities normally performed by clinical staff). Physician's time may include the following activities when performed: Preparing to see the patient (e.g., pre-charting review of records, searching for previously ordered imaging, lab work, and nerve conduction tests) Review of prior analgesic pharmacotherapies. Reviewing PMP Interpreting ordered tests (e.g., lab work, imaging, nerve conduction tests) Performing post-procedure evaluations, including interpretation of diagnostic procedures Obtaining and/or reviewing separately obtained history Performing a medically appropriate examination and/or evaluation Counseling and educating the patient/family/caregiver Ordering medications, tests, or procedures Referring and communicating with other health care professionals (when not separately reported) Documenting clinical information in the electronic or other health record Independently interpreting results (not separately reported) and communicating results to the patient/ family/caregiver Care coordination (not separately reported)  Note by: Oswaldo Done, MD (TTS technology used. I apologize for any typographical errors that were not detected and corrected.) Date: 05/22/2023; Time: 2:05 PM

## 2023-05-21 NOTE — Patient Instructions (Signed)
____________________________________________________________________________________________  New Patients  Welcome to [facility].   Initial Visit The first or initial visit consists of an evaluation only.   Interventional pain management.  We offer therapies other than opioid controlled substances to manage chronic pain. These include, but are not limited to, diagnostic, therapeutic, and palliative specialized injection therapies (i.e.: Epidural Steroids, Facet Blocks, etc.). We specialize in a variety of nerve blocks as well as radiofrequency treatments. We offer pain implant evaluations and trials, as well as follow up management. In addition we also provide a variety joint injections, including Viscosupplementation (AKA: Gel Therapy).  Prescription Pain Medication. We specialize in alternatives to opioids. We can provide evaluations and recommendations for/of pharmacologic therapies based on CDC Guidelines.  We no longer take patients for long-term medication management. We will not be taking over your pain medications.  ____________________________________________________________________________________________    ____________________________________________________________________________________________  Patient Information update  To: All of our patients.  Re: Name change.  It has been made official that our current name, "[facility]"   will soon be changed to "[facility]".   The purpose of this change is to eliminate any confusion created by the concept of our practice being a "Medication Management Pain Clinic". In the past this has led to the misconception that we treat pain primarily by the use of prescription medications.  Nothing can be farther from the truth.   Understanding PAIN MANAGEMENT: To  further understand what our practice does, you first have to understand that "Pain Management" is a subspecialty that requires additional training once a physician has completed their specialty training, which can be in either Anesthesia, Neurology, Psychiatry, or Physical Medicine and Rehabilitation (PMR). Each one of these contributes to the final approach taken by each physician to the management of their patient's pain. To be a "Pain Management Specialist" you must have first completed one of the specialty trainings below.  Anesthesiologists - trained in clinical pharmacology and interventional techniques such as nerve blockade and regional as well as central neuroanatomy. They are trained to block pain before, during, and after surgical interventions.  Neurologists - trained in the diagnosis and pharmacological treatment of complex neurological conditions, such as Multiple Sclerosis, Parkinson's, spinal cord injuries, and other systemic conditions that may be associated with symptoms that may include but are not limited to pain. They tend to rely primarily on the treatment of chronic pain using prescription medications.  Psychiatrist - trained in conditions affecting the psychosocial wellbeing of patients including but not limited to depression, anxiety, schizophrenia, personality disorders, addiction, and other substance use disorders that may be associated with chronic pain. They tend to rely primarily on the treatment of chronic pain using prescription medications.   Physical Medicine and Rehabilitation (PMR) physicians, also known as physiatrists - trained to treat a wide variety of medical conditions affecting the brain, spinal cord, nerves, bones, joints, ligaments, muscles, and tendons. Their training is primarily aimed at treating patients that have suffered injuries that have caused severe physical impairment. Their training is primarily aimed at the physical therapy and rehabilitation of those  patients. They may also work alongside orthopedic surgeons or neurosurgeons using their expertise in assisting surgical patients to recover after their surgeries.  INTERVENTIONAL PAIN MANAGEMENT is sub-subspecialty of Pain Management.  Our physicians are Board-certified in Anesthesia, Pain Management, and Interventional Pain Management.  This meaning that not only have they been trained   and Board-certified in their specialty of Anesthesia, and subspecialty of Pain Management, but they have also received further training in the sub-subspecialty of Interventional Pain Management, in order to become Board-certified as INTERVENTIONAL PAIN MANAGEMENT SPECIALIST.    Mission: Our goal is to use our skills in  INTERVENTIONAL PAIN MANAGEMENT as alternatives to the chronic use of prescription opioid medications for the treatment of pain. To make this more clear, we have changed our name to reflect what we do and offer. We will continue to offer medication management assessment and recommendations, but we will not be taking over any patient's medication management.  ____________________________________________________________________________________________     

## 2023-05-22 ENCOUNTER — Encounter: Payer: Self-pay | Admitting: Pain Medicine

## 2023-05-22 ENCOUNTER — Ambulatory Visit: Payer: 59 | Attending: Pain Medicine | Admitting: Pain Medicine

## 2023-05-22 VITALS — BP 124/79 | HR 66 | Temp 97.2°F | Resp 16 | Ht 59.0 in | Wt 192.0 lb

## 2023-05-22 DIAGNOSIS — G8929 Other chronic pain: Secondary | ICD-10-CM | POA: Insufficient documentation

## 2023-05-22 DIAGNOSIS — S6291XS Unspecified fracture of right wrist and hand, sequela: Secondary | ICD-10-CM | POA: Diagnosis present

## 2023-05-22 DIAGNOSIS — R937 Abnormal findings on diagnostic imaging of other parts of musculoskeletal system: Secondary | ICD-10-CM | POA: Diagnosis present

## 2023-05-22 DIAGNOSIS — M47812 Spondylosis without myelopathy or radiculopathy, cervical region: Secondary | ICD-10-CM | POA: Diagnosis present

## 2023-05-22 DIAGNOSIS — M545 Low back pain, unspecified: Secondary | ICD-10-CM | POA: Insufficient documentation

## 2023-05-22 DIAGNOSIS — M5030 Other cervical disc degeneration, unspecified cervical region: Secondary | ICD-10-CM

## 2023-05-22 DIAGNOSIS — M79672 Pain in left foot: Secondary | ICD-10-CM | POA: Diagnosis not present

## 2023-05-22 DIAGNOSIS — S52531S Colles' fracture of right radius, sequela: Secondary | ICD-10-CM | POA: Diagnosis present

## 2023-05-22 DIAGNOSIS — M25512 Pain in left shoulder: Secondary | ICD-10-CM | POA: Diagnosis not present

## 2023-05-22 DIAGNOSIS — Z789 Other specified health status: Secondary | ICD-10-CM | POA: Diagnosis present

## 2023-05-22 DIAGNOSIS — M542 Cervicalgia: Secondary | ICD-10-CM | POA: Insufficient documentation

## 2023-05-22 DIAGNOSIS — M4310 Spondylolisthesis, site unspecified: Secondary | ICD-10-CM | POA: Diagnosis present

## 2023-05-22 DIAGNOSIS — M4802 Spinal stenosis, cervical region: Secondary | ICD-10-CM | POA: Diagnosis present

## 2023-05-22 DIAGNOSIS — M899 Disorder of bone, unspecified: Secondary | ICD-10-CM | POA: Diagnosis present

## 2023-05-22 DIAGNOSIS — G894 Chronic pain syndrome: Secondary | ICD-10-CM | POA: Insufficient documentation

## 2023-05-22 DIAGNOSIS — M79671 Pain in right foot: Secondary | ICD-10-CM | POA: Insufficient documentation

## 2023-05-22 DIAGNOSIS — M4312 Spondylolisthesis, cervical region: Secondary | ICD-10-CM

## 2023-05-22 DIAGNOSIS — Z79899 Other long term (current) drug therapy: Secondary | ICD-10-CM | POA: Diagnosis present

## 2023-05-22 DIAGNOSIS — G4486 Cervicogenic headache: Secondary | ICD-10-CM | POA: Diagnosis not present

## 2023-05-22 HISTORY — DX: Other cervical disc degeneration, unspecified cervical region: M50.30

## 2023-05-22 NOTE — Progress Notes (Signed)
Safety precautions to be maintained throughout the outpatient stay will include: orient to surroundings, keep bed in low position, maintain call bell within reach at all times, provide assistance with transfer out of bed and ambulation.  

## 2023-05-24 ENCOUNTER — Ambulatory Visit
Admission: RE | Admit: 2023-05-24 | Discharge: 2023-05-24 | Disposition: A | Discharge status: Home / Self Care | Payer: BLUE CROSS/BLUE SHIELD | Source: Ambulatory Visit | Attending: Pain Medicine | Admitting: Pain Medicine

## 2023-05-24 ENCOUNTER — Other Ambulatory Visit
Admission: RE | Admit: 2023-05-24 | Discharge: 2023-05-24 | Disposition: A | Discharge status: Home / Self Care | Payer: BLUE CROSS/BLUE SHIELD | Source: Home / Self Care | Attending: Pain Medicine | Admitting: Pain Medicine

## 2023-05-24 ENCOUNTER — Ambulatory Visit
Admission: RE | Admit: 2023-05-24 | Discharge: 2023-05-24 | Disposition: A | Discharge status: Home / Self Care | Payer: BLUE CROSS/BLUE SHIELD | Attending: Pain Medicine | Admitting: Pain Medicine

## 2023-05-24 DIAGNOSIS — G4486 Cervicogenic headache: Secondary | ICD-10-CM | POA: Insufficient documentation

## 2023-05-24 DIAGNOSIS — M79672 Pain in left foot: Secondary | ICD-10-CM | POA: Diagnosis not present

## 2023-05-24 DIAGNOSIS — M899 Disorder of bone, unspecified: Secondary | ICD-10-CM | POA: Insufficient documentation

## 2023-05-24 DIAGNOSIS — M545 Low back pain, unspecified: Secondary | ICD-10-CM | POA: Insufficient documentation

## 2023-05-24 DIAGNOSIS — Z789 Other specified health status: Secondary | ICD-10-CM | POA: Diagnosis not present

## 2023-05-24 DIAGNOSIS — Z79899 Other long term (current) drug therapy: Secondary | ICD-10-CM | POA: Diagnosis not present

## 2023-05-24 DIAGNOSIS — G8929 Other chronic pain: Secondary | ICD-10-CM | POA: Insufficient documentation

## 2023-05-24 DIAGNOSIS — E559 Vitamin D deficiency, unspecified: Secondary | ICD-10-CM | POA: Diagnosis not present

## 2023-05-24 DIAGNOSIS — M542 Cervicalgia: Secondary | ICD-10-CM | POA: Insufficient documentation

## 2023-05-24 DIAGNOSIS — M25512 Pain in left shoulder: Secondary | ICD-10-CM | POA: Diagnosis present

## 2023-05-24 DIAGNOSIS — M79671 Pain in right foot: Secondary | ICD-10-CM | POA: Diagnosis not present

## 2023-05-24 LAB — MAGNESIUM: Magnesium: 2 mg/dL (ref 1.7–2.4)

## 2023-05-24 LAB — COMPLIANCE DRUG ANALYSIS, UR

## 2023-05-24 LAB — SEDIMENTATION RATE: Sed Rate: 6 mm/hr (ref 0–20)

## 2023-05-24 LAB — VITAMIN B12: Vitamin B-12: 227 pg/mL (ref 180–914)

## 2023-05-24 LAB — C-REACTIVE PROTEIN: CRP: 0.7 mg/dL (ref <1.0)

## 2023-05-29 ENCOUNTER — Encounter: Payer: Self-pay | Admitting: Pain Medicine

## 2023-05-29 LAB — 25-HYDROXY VITAMIN D LCMS D2+D3
25-Hydroxy, Vitamin D-2: <1 ng/mL
25-Hydroxy, Vitamin D-3: 26 ng/mL
25-Hydroxy, Vitamin D: 26 ng/mL — ABNORMAL LOW

## 2023-06-06 NOTE — Progress Notes (Unsigned)
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    albuterol (VENTOLIN HFA) 108 (90 Base) MCG/ACT inhaler, Inhale 2 puffs into the lungs every 6 (six) hours as needed for wheezing or shortness of breath., Disp: 8 g, Rfl: 2   Ascorbic Acid (VITAMIN C) 500 MG CAPS, Take 1 tablet by mouth daily., Disp: , Rfl:    celecoxib (CELEBREX) 200 MG capsule, Take by mouth., Disp: , Rfl:    citalopram (CELEXA) 20  MG tablet, Take 20 mg by mouth daily., Disp: , Rfl:    clonazePAM (KLONOPIN) 0.5 MG tablet, Take by mouth., Disp: , Rfl:    cyclobenzaprine (FLEXERIL) 10 MG tablet, Take 1 tablet (10 mg total) by mouth 3 (three) times daily as needed., Disp: 30 tablet, Rfl: 0   famotidine (PEPCID) 40 MG tablet, Take 40 mg by mouth  daily., Disp: , Rfl:    felodipine (PLENDIL) 2.5 MG 24 hr tablet, Take 2.5 mg by mouth daily., Disp: , Rfl:    ferrous sulfate 325 (65 FE) MG tablet, Take 325 mg by mouth daily. , Disp: , Rfl:    Fluticasone-Umeclidin-Vilant (TRELEGY ELLIPTA) 200-62.5-25 MCG/ACT AEPB, Inhale into the lungs., Disp: , Rfl:    gabapentin (NEURONTIN) 300 MG capsule, Take 2 capsules by mouth 2 (two) times daily., Disp: , Rfl:    ibuprofen (ADVIL) 800 MG tablet, Take by mouth., Disp: , Rfl:    ipratropium-albuterol (DUONEB) 0.5-2.5 (3) MG/3ML SOLN, Take 3 mLs by nebulization every 4 (four) hours as needed., Disp: 360 mL, Rfl: 0   pantoprazole (PROTONIX) 40 MG tablet, Take by mouth., Disp: , Rfl:    polyethylene glycol (MIRALAX) 17 g packet, Take 17 g by mouth daily., Disp: 14 each, Rfl: 0   tranexamic acid (LYSTEDA) 650 MG TABS tablet, Take by mouth., Disp: , Rfl:    valACYclovir (VALTREX) 1000 MG tablet, Take two tabs twice daily for one day., Disp: , Rfl:   ROS  Constitutional: Denies any fever or chills Gastrointestinal: No reported hemesis, hematochezia, vomiting, or acute GI distress Musculoskeletal: Denies any acute onset joint swelling, redness, loss of ROM, or weakness Neurological: No reported episodes of acute onset apraxia, aphasia, dysarthria, agnosia, amnesia, paralysis, loss of coordination, or loss of consciousness  Allergies  Monica Gonzalez is allergic to prednisone.  PFSH  Drug: Monica Gonzalez  reports no history of drug use. Alcohol:  reports that she does not currently use alcohol. Tobacco:  reports that she has never smoked. She has never used smokeless tobacco. Medical:  has a past medical history of Anemia, Asthma, Fibromyalgia, GERD (gastroesophageal reflux disease), Heart murmur, History of hiatal hernia, and Lupus (HCC). Surgical: Monica Gonzalez  has a past surgical history that includes Tubal ligation; Tonsillectomy; Vulvectomy (N/A, 07/29/2020); Colonoscopy with propofol (N/A, 02/17/2023); and  Esophagogastroduodenoscopy (egd) with propofol (N/A, 02/17/2023). Family: family history includes Congestive Heart Failure in her mother; Diabetes in her mother.  Constitutional Exam  General appearance: Well nourished, well developed, and well hydrated. In no apparent acute distress There were no vitals filed for this visit. BMI Assessment: Estimated body mass index is 38.78 kg/m as calculated from the following:   Height as of 05/22/23: 4\' 11"  (1.499 m).   Weight as of 05/22/23: 192 lb (87.1 kg).  BMI interpretation table: BMI level Category Range association with higher incidence of chronic pain  <18 kg/m2 Underweight   18.5-24.9 kg/m2 Ideal body weight   25-29.9 kg/m2 Overweight Increased incidence by 20%  30-34.9 kg/m2 Obese (Class I) Increased incidence by 68%  35-39.9 kg/m2 Severe obesity (Class II) Increased incidence by 136%  >40 kg/m2 Extreme obesity (Class III) Increased incidence by 254%   Patient's current BMI Ideal Body weight  There is no height or weight on file to calculate BMI. Patient weight not recorded   BMI Readings from Last 4 Encounters:  05/22/23 38.78 kg/m  02/17/23 38.78 kg/m  11/27/22 37.77 kg/m  11/08/22 38.58 kg/m   Wt Readings from Last 4 Encounters:  05/22/23 192 lb (87.1 kg)  02/17/23 192 lb (87.1 kg)  11/27/22 187 lb (84.8 kg)  11/08/22 191 lb (86.6 kg)    Psych/Mental status: Alert, oriented x 3 (person, place, & time)       Eyes: PERLA Respiratory: No evidence of acute respiratory distress  Assessment & Plan  Primary Diagnosis & Pertinent Problem List: The primary encounter diagnosis was Chronic neck pain (1ry area of Pain) (Bilateral) (L>R). 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  Visit Diagnosis: 1. Chronic neck pain (1ry area of Pain) (Bilateral) (L>R)   2. Chronic shoulder pain (2ry area of Pain) (Left)   3. Chronic low back pain (3ry area of Pain) (Bilateral) (R>L) w/o sciatica   4. Cervicogenic headache (4th area of Pain) (Bilateral)   5. Chronic feet pain (5th area of Pain) (Bilateral)   6. Cervical facet hypertrophy   7. 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 Grade 1 Retrolisthesis of C4/C5 & C5/C6   9. DDD (degenerative disc disease), cervical   10. Abnormal MRI, cervical spine (01/16/2023)    Problems updated and reviewed during this visit: No problems updated.  Plan of Care  Pharmacotherapy (Medications Ordered): No orders of the defined types were placed in this encounter.  Procedure Orders    No procedure(s) ordered today   Lab Orders  No laboratory test(s) ordered today   Imaging Orders  No imaging studies ordered today   Referral Orders  No referral(s) requested today    Pharmacological management:  Opioid Analgesics: I will not be prescribing any opioids at this time Membrane stabilizer: I will not be prescribing any at this time Muscle relaxant: I will not be prescribing any at this time NSAID: I will not be prescribing any at this time Other analgesic(s): I will not be prescribing any at this time      Interventional Therapies  Risk Factors  Considerations  Medical Comorbidities:     Planned  Pending:   Diagnostic left cervical ESI #1    Under consideration:   Diagnostic left cervical ESI #1    Completed:   None at this time   Therapeutic  Palliative (PRN) options:   None established   Completed by other providers:   None reported       Provider-requested follow-up: No follow-ups on file. Recent Visits Date Type  Provider Dept  05/22/23 Office Visit Delano Metz, MD [facility]  Showing recent visits within past 90 days and meeting all other requirements Future Appointments Date Type Provider Dept  06/07/23 Appointment Delano Metz, MD [facility]  Showing future appointments within next 90 days and meeting all other requirements   Primary Care Physician: Mick Sell, MD  Duration of encounter: *** minutes.  Total time on encounter, as per AMA guidelines included both the face-to-face and non-face-to-face time personally spent by the physician and/or other qualified health care professional(s) on the day of the encounter (includes time in activities that require the physician or other qualified health care professional and does not include time in activities normally performed by clinical staff). Physician's time may include  the following activities when performed: Preparing to see the patient (e.g., pre-charting review of records, searching for previously ordered imaging, lab work, and nerve conduction tests) Review of prior analgesic pharmacotherapies. Reviewing PMP Interpreting ordered tests (e.g., lab work, imaging, nerve conduction tests) Performing post-procedure evaluations, including interpretation of diagnostic procedures Obtaining and/or reviewing separately obtained history Performing a medically appropriate examination and/or evaluation Counseling and educating the patient/family/caregiver Ordering medications, tests, or procedures Referring and communicating with other health care professionals (when not separately reported) Documenting clinical information in the electronic or other health record Independently interpreting results (not separately reported) and communicating results to the patient/ family/caregiver Care coordination (not separately reported)  Note by: Oswaldo Done, MD (TTS technology used. I apologize for any typographical  errors that were not detected and corrected.) Date: 06/07/2023; Time: 6:47 PM

## 2023-06-07 ENCOUNTER — Encounter: Payer: Self-pay | Admitting: Pain Medicine

## 2023-06-07 ENCOUNTER — Ambulatory Visit: Payer: 59 | Attending: Pain Medicine | Admitting: Pain Medicine

## 2023-06-07 VITALS — BP 107/64 | HR 57 | Temp 97.2°F | Ht 59.0 in | Wt 192.0 lb

## 2023-06-07 DIAGNOSIS — M79602 Pain in left arm: Secondary | ICD-10-CM | POA: Insufficient documentation

## 2023-06-07 DIAGNOSIS — M4722 Other spondylosis with radiculopathy, cervical region: Secondary | ICD-10-CM

## 2023-06-07 DIAGNOSIS — M25512 Pain in left shoulder: Secondary | ICD-10-CM | POA: Diagnosis not present

## 2023-06-07 DIAGNOSIS — G4486 Cervicogenic headache: Secondary | ICD-10-CM | POA: Insufficient documentation

## 2023-06-07 DIAGNOSIS — R937 Abnormal findings on diagnostic imaging of other parts of musculoskeletal system: Secondary | ICD-10-CM | POA: Diagnosis present

## 2023-06-07 DIAGNOSIS — M4802 Spinal stenosis, cervical region: Secondary | ICD-10-CM | POA: Diagnosis present

## 2023-06-07 DIAGNOSIS — M5030 Other cervical disc degeneration, unspecified cervical region: Secondary | ICD-10-CM | POA: Diagnosis present

## 2023-06-07 DIAGNOSIS — M542 Cervicalgia: Secondary | ICD-10-CM | POA: Diagnosis not present

## 2023-06-07 DIAGNOSIS — M79671 Pain in right foot: Secondary | ICD-10-CM | POA: Diagnosis present

## 2023-06-07 DIAGNOSIS — M4310 Spondylolisthesis, site unspecified: Secondary | ICD-10-CM | POA: Insufficient documentation

## 2023-06-07 DIAGNOSIS — M79672 Pain in left foot: Secondary | ICD-10-CM | POA: Insufficient documentation

## 2023-06-07 DIAGNOSIS — M545 Low back pain, unspecified: Secondary | ICD-10-CM | POA: Insufficient documentation

## 2023-06-07 DIAGNOSIS — M5412 Radiculopathy, cervical region: Secondary | ICD-10-CM | POA: Diagnosis present

## 2023-06-07 DIAGNOSIS — M4312 Spondylolisthesis, cervical region: Secondary | ICD-10-CM

## 2023-06-07 DIAGNOSIS — M47812 Spondylosis without myelopathy or radiculopathy, cervical region: Secondary | ICD-10-CM | POA: Diagnosis present

## 2023-06-07 DIAGNOSIS — G8929 Other chronic pain: Secondary | ICD-10-CM | POA: Diagnosis present

## 2023-06-07 NOTE — Patient Instructions (Signed)

## 2023-06-10 ENCOUNTER — Other Ambulatory Visit: Payer: Self-pay

## 2023-06-10 ENCOUNTER — Emergency Department
Admission: EM | Admit: 2023-06-10 | Discharge: 2023-06-10 | Disposition: A | Discharge status: Home / Self Care | Payer: 59 | Attending: Emergency Medicine | Admitting: Emergency Medicine

## 2023-06-10 ENCOUNTER — Emergency Department: Payer: 59

## 2023-06-10 DIAGNOSIS — M542 Cervicalgia: Secondary | ICD-10-CM | POA: Diagnosis not present

## 2023-06-10 DIAGNOSIS — M25512 Pain in left shoulder: Secondary | ICD-10-CM | POA: Diagnosis not present

## 2023-06-10 DIAGNOSIS — J45909 Unspecified asthma, uncomplicated: Secondary | ICD-10-CM | POA: Diagnosis not present

## 2023-06-10 DIAGNOSIS — Y92410 Unspecified street and highway as the place of occurrence of the external cause: Secondary | ICD-10-CM | POA: Insufficient documentation

## 2023-06-10 DIAGNOSIS — M545 Low back pain, unspecified: Secondary | ICD-10-CM | POA: Insufficient documentation

## 2023-06-10 LAB — POC URINE PREG, ED: Preg Test, Ur: NEGATIVE

## 2023-06-10 MED ORDER — CYCLOBENZAPRINE HCL 10 MG PO TABS
5.0000 mg | ORAL_TABLET | Freq: Once | ORAL | Status: AC
  Administered 2023-06-10: 5 mg via ORAL
  Filled 2023-06-10: qty 1

## 2023-06-10 MED ORDER — HYDROCODONE-ACETAMINOPHEN 5-325 MG PO TABS: 1.0000 | ORAL_TABLET | Freq: Four times a day (QID) | ORAL | 0 refills | Status: AC | PRN

## 2023-06-10 MED ORDER — OXYCODONE-ACETAMINOPHEN 5-325 MG PO TABS
1.0000 | ORAL_TABLET | Freq: Once | ORAL | Status: AC
  Administered 2023-06-10: 1 via ORAL
  Filled 2023-06-10: qty 1

## 2023-06-10 MED ORDER — CYCLOBENZAPRINE HCL 5 MG PO TABS: 5.0000 mg | ORAL_TABLET | Freq: Three times a day (TID) | ORAL | 0 refills | Status: DC | PRN
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
## 2023-06-10 NOTE — ED Notes (Signed)
Pt verbalizes understanding of discharge instructions. Opportunity for questioning and answers were provided. Pt discharged from ED to home with friend.    

## 2023-06-10 NOTE — ED Notes (Signed)
Patient transported to CT 

## 2023-06-21 ENCOUNTER — Encounter: Payer: Self-pay | Admitting: Pain Medicine

## 2023-06-22 ENCOUNTER — Emergency Department: Payer: BLUE CROSS/BLUE SHIELD

## 2023-06-22 ENCOUNTER — Other Ambulatory Visit: Payer: Self-pay

## 2023-06-22 ENCOUNTER — Emergency Department
Admission: EM | Admit: 2023-06-22 | Discharge: 2023-06-22 | Disposition: A | Discharge status: Home / Self Care | Payer: BLUE CROSS/BLUE SHIELD | Attending: Student in an Organized Health Care Education/Training Program | Admitting: Student in an Organized Health Care Education/Training Program

## 2023-06-22 ENCOUNTER — Ambulatory Visit: Payer: 59 | Admitting: Pain Medicine

## 2023-06-22 DIAGNOSIS — Z1152 Encounter for screening for COVID-19: Secondary | ICD-10-CM | POA: Insufficient documentation

## 2023-06-22 DIAGNOSIS — J40 Bronchitis, not specified as acute or chronic: Secondary | ICD-10-CM | POA: Insufficient documentation

## 2023-06-22 DIAGNOSIS — R0789 Other chest pain: Secondary | ICD-10-CM | POA: Diagnosis present

## 2023-06-22 LAB — CBC
HCT: 43.1 % (ref 36.0–46.0)
Hemoglobin: 13.4 g/dL (ref 12.0–15.0)
MCH: 29.5 pg (ref 26.0–34.0)
MCHC: 31.1 g/dL (ref 30.0–36.0)
MCV: 94.7 fL (ref 80.0–100.0)
Platelets: 305 10*3/uL (ref 150–400)
RBC: 4.55 MIL/uL (ref 3.87–5.11)
RDW: 13.4 % (ref 11.5–15.5)
WBC: 5.6 10*3/uL (ref 4.0–10.5)
nRBC: 0 % (ref 0.0–0.2)

## 2023-06-22 LAB — BASIC METABOLIC PANEL
Anion gap: 7 (ref 5–15)
BUN: 11 mg/dL (ref 6–20)
CO2: 23 mmol/L (ref 22–32)
Calcium: 8.3 mg/dL — ABNORMAL LOW (ref 8.9–10.3)
Chloride: 110 mmol/L (ref 98–111)
Creatinine, Ser: 0.82 mg/dL (ref 0.44–1.00)
GFR, Estimated: >60 mL/min (ref >60)
Glucose, Bld: 90 mg/dL (ref 70–99)
Potassium: 3.7 mmol/L (ref 3.5–5.1)
Sodium: 140 mmol/L (ref 135–145)

## 2023-06-22 LAB — SARS CORONAVIRUS 2 BY RT PCR: SARS Coronavirus 2 by RT PCR: NEGATIVE

## 2023-06-22 LAB — TROPONIN I (HIGH SENSITIVITY): Troponin I (High Sensitivity): 2 ng/L (ref <18)

## 2023-06-22 MED ORDER — ALBUTEROL SULFATE HFA 108 (90 BASE) MCG/ACT IN AERS: 2.0000 | INHALATION_SPRAY | Freq: Four times a day (QID) | RESPIRATORY_TRACT | 2 refills | Status: DC | PRN

## 2023-06-22 MED ORDER — AZITHROMYCIN 250 MG PO TABS: ORAL_TABLET | ORAL | 0 refills | Status: AC

## 2023-06-22 MED ORDER — METHYLPREDNISOLONE 4 MG PO TBPK: ORAL_TABLET | ORAL | 1 refills | Status: DC

## 2023-06-22 MED ORDER — HYDROCOD POLI-CHLORPHE POLI ER 10-8 MG/5ML PO SUER: 5.0000 mL | Freq: Every evening | ORAL | 0 refills | Status: DC | PRN
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
## 2023-06-28 ENCOUNTER — Emergency Department: Payer: BLUE CROSS/BLUE SHIELD

## 2023-06-28 ENCOUNTER — Other Ambulatory Visit: Payer: Self-pay

## 2023-06-28 ENCOUNTER — Emergency Department
Admission: EM | Admit: 2023-06-28 | Discharge: 2023-06-28 | Disposition: A | Discharge status: Home / Self Care | Payer: BLUE CROSS/BLUE SHIELD | Attending: Emergency Medicine | Admitting: Emergency Medicine

## 2023-06-28 ENCOUNTER — Encounter: Payer: Self-pay | Admitting: Emergency Medicine

## 2023-06-28 DIAGNOSIS — J40 Bronchitis, not specified as acute or chronic: Secondary | ICD-10-CM | POA: Insufficient documentation

## 2023-06-28 DIAGNOSIS — R0781 Pleurodynia: Secondary | ICD-10-CM | POA: Diagnosis present

## 2023-06-28 DIAGNOSIS — R0789 Other chest pain: Secondary | ICD-10-CM

## 2023-06-28 LAB — CBC
HCT: 45.5 % (ref 36.0–46.0)
Hemoglobin: 14 g/dL (ref 12.0–15.0)
MCH: 29.5 pg (ref 26.0–34.0)
MCHC: 30.8 g/dL (ref 30.0–36.0)
MCV: 95.8 fL (ref 80.0–100.0)
Platelets: 309 10*3/uL (ref 150–400)
RBC: 4.75 MIL/uL (ref 3.87–5.11)
RDW: 13.1 % (ref 11.5–15.5)
WBC: 9.7 10*3/uL (ref 4.0–10.5)
nRBC: 0 % (ref 0.0–0.2)

## 2023-06-28 LAB — BASIC METABOLIC PANEL
Anion gap: 11 (ref 5–15)
BUN: 9 mg/dL (ref 6–20)
CO2: 20 mmol/L — ABNORMAL LOW (ref 22–32)
Calcium: 8.6 mg/dL — ABNORMAL LOW (ref 8.9–10.3)
Chloride: 108 mmol/L (ref 98–111)
Creatinine, Ser: 0.84 mg/dL (ref 0.44–1.00)
GFR, Estimated: >60 mL/min (ref >60)
Glucose, Bld: 84 mg/dL (ref 70–99)
Potassium: 3.7 mmol/L (ref 3.5–5.1)
Sodium: 139 mmol/L (ref 135–145)

## 2023-06-28 LAB — TROPONIN I (HIGH SENSITIVITY): Troponin I (High Sensitivity): 2 ng/L (ref <18)

## 2023-06-28 LAB — POC URINE PREG, ED: Preg Test, Ur: NEGATIVE

## 2023-06-28 MED ORDER — BENZONATATE 100 MG PO CAPS: ORAL_CAPSULE | ORAL | 0 refills | Status: DC

## 2023-06-28 MED ORDER — LIDOCAINE 5 % EX PTCH
1.0000 | MEDICATED_PATCH | Freq: Once | CUTANEOUS | Status: DC
  Administered 2023-06-28: 1 via TRANSDERMAL
  Filled 2023-06-28: qty 1

## 2023-06-28 MED ORDER — IPRATROPIUM-ALBUTEROL 0.5-2.5 (3) MG/3ML IN SOLN
3.0000 mL | Freq: Once | RESPIRATORY_TRACT | Status: AC
  Administered 2023-06-28: 3 mL via RESPIRATORY_TRACT
  Filled 2023-06-28: qty 3

## 2023-06-28 MED ORDER — PSEUDOEPH-BROMPHEN-DM 30-2-10 MG/5ML PO SYRP: 5.0000 mL | ORAL_SOLUTION | Freq: Four times a day (QID) | ORAL | 0 refills | Status: DC | PRN

## 2023-06-28 MED ORDER — LIDOCAINE 5 % EX PTCH: 1.0000 | MEDICATED_PATCH | Freq: Two times a day (BID) | CUTANEOUS | 0 refills | Status: AC | PRN

## 2023-06-28 NOTE — ED Triage Notes (Signed)
Pt via POV from [facility]. States seen here for SOB last Thursday and was dx with bronchitis. States that prescription medication has not been working. States she still having SOB and pain near her L side rib. Endorses dry cough. Pt is A&OX4 and NAD

## 2023-06-28 NOTE — Discharge Instructions (Signed)
Your exam, labs, EKG, and chest x-ray are normal and reassuring at this time.  Low risk and clinical concern for PE/DVT based on your presentation.  Take the prescription meds as directed.  Follow-up with your primary provider or return to the ED if needed.

## 2023-06-28 NOTE — ED Provider Notes (Signed)
[facility] Emergency Department Provider Note     Event Date/Time   First MD Initiated Contact with Patient 06/28/23 1659     (approximate)   History   Shortness of Breath   HPI  Monica Gonzalez is a 47 y.o. female with a history of asthma, lupus, GERD, and fibromyalgia, presents to the ED, from [facility].  Patient was evaluated here in the ED for shortness of breath a week ago, diagnosed with bronchitis.  She denies any significant benefit with the previously prescribed medication.  She would endorse ongoing intermittent shortness of breath and left-sided rib pain.  She would also endorse a dry, nonproductive cough.  Patient denies any nausea, vomiting, hemoptysis, FCS, or NVD.  Physical Exam   Triage Vital Signs: ED Triage Vitals  Encounter Vitals Group     BP 06/28/23 1523 129/77     Systolic BP Percentile --      Diastolic BP Percentile --      Pulse Rate 06/28/23 1523 66     Resp 06/28/23 1523 18     Temp 06/28/23 1523 98 F (36.7 C)     Temp src --      SpO2 06/28/23 1523 100 %     Weight 06/28/23 1522 192 lb (87.1 kg)     Height 06/28/23 1522 4\' 11"  (1.499 m)     Head Circumference --      Peak Flow --      Pain Score 06/28/23 1521 8     Pain Loc --      Pain Education --      Exclude from Growth Chart --     Most recent vital signs: Vitals:   06/28/23 1523  BP: 129/77  Pulse: 66  Resp: 18  Temp: 98 F (36.7 C)  SpO2: 100%    General Awake, no distress. NAD HEENT NCAT. PERRL. EOMI. No rhinorrhea. Mucous membranes are moist.  CV:  Good peripheral perfusion. RRR RESP:  Normal effort. CTA ABD:  No distention.    ED Results / Procedures / Treatments   Labs (all labs ordered are listed, but only abnormal results are displayed) Labs Reviewed  BASIC METABOLIC PANEL - Abnormal; Notable for the following components:      Result Value   CO2 20 (*)    Calcium 8.6 (*)    All other components within normal limits  POC URINE PREG,  ED - Normal  CBC  TROPONIN I (HIGH SENSITIVITY)   EKG  Sinus bradycardia at 59 bpm Normal axes No STEMI  RADIOLOGY  I personally viewed and evaluated these images as part of my medical decision making, as well as reviewing the written report by the radiologist.  ED Provider Interpretation: No acute findings  CXR  IMPRESSION: 1. Low lung volumes. 2. No acute cardiopulmonary disease   PROCEDURES:  Critical Care performed: No  Procedures   MEDICATIONS ORDERED IN ED: Medications  ipratropium-albuterol (DUONEB) 0.5-2.5 (3) MG/3ML nebulizer solution 3 mL (3 mLs Nebulization Given by Other 06/28/23 1729)     IMPRESSION / MDM / ASSESSMENT AND PLAN / ED COURSE  I reviewed the triage vital signs and the nursing notes.                              Differential diagnosis includes, but is not limited to, viral syndrome, bronchitis including COPD exacerbation, pneumonia, reactive airway disease including asthma, CHF including exacerbation  with or without pulmonary/interstitial edema, pneumothorax, ACS, thoracic trauma, and pulmonary embolism.  Patient's presentation is most consistent with acute complicated illness / injury requiring diagnostic workup.  Patient's diagnosis is consistent with bronchitis with chest wall pain.  Patient presents in no acute distress without tachycardia, tachypnea, or hypoxia.  Patient with mild intermittent cough and no reports of intermittent fevers, hemoptysis, lower extremity edema, or tachypnea.  Chest x-ray without evidence of acute intrathoracic process based on my interpretation.  EKG without evidence of malignant arrhythmia.  Labs otherwise normal reassuring including negative troponin.  Patient with a negative Wells score is also PERC negative with a low risk or concern for PE/DVT on this presentation.  Patient reports improvement of her symptoms after DuoNeb provided in the ED.  Patient will be discharged home with prescriptions for Tessalon  Perles, Bromfed syrup, and Lidoderm patches. Patient is to follow up with her primary provider as needed or otherwise directed. Patient is given ED precautions to return to the ED for any worsening or new symptoms.  FINAL CLINICAL IMPRESSION(S) / ED DIAGNOSES   Final diagnoses:  Bronchitis  Chest wall pain     Rx / DC Orders   ED Discharge Orders          Ordered    lidocaine (LIDODERM) 5 %  Every 12 hours PRN        06/28/23 1829    benzonatate (TESSALON PERLES) 100 MG capsule        06/28/23 1829    brompheniramine-pseudoephedrine-DM 30-2-10 MG/5ML syrup  4 times daily PRN        06/28/23 1829             Note:  This document was prepared using Dragon voice recognition software and may include unintentional dictation errors.    Lissa Hoard, PA-C 07/01/23 2343    Janith Lima, MD 07/02/23 [phone]

## 2023-07-03 ENCOUNTER — Encounter: Payer: Self-pay | Admitting: Pain Medicine

## 2023-07-03 NOTE — Patient Instructions (Signed)

## 2023-07-03 NOTE — Progress Notes (Unsigned)
PROVIDER NOTE: Interpretation of information contained herein should be left to medically-trained personnel. Specific patient instructions are provided elsewhere under "Patient Instructions" section of medical record. This document was created in part using STT-dictation technology, any transcriptional errors that may result from this process are unintentional.  Patient: Monica Gonzalez Type: Established DOB: [birth year] MRN: [id number] PCP: Mick Sell, MD  Service: Procedure DOS: 07/04/2023 Setting: Ambulatory Location: Ambulatory outpatient facility Delivery: Face-to-face Provider: Oswaldo Done, MD Specialty: Interventional Pain Management Specialty designation: 09 Location: Outpatient facility Ref. Prov.: Mick Sell, MD       Interventional Therapy   Procedure: Cervical Epidural Steroid injection (CESI) (Interlaminar) #1  Laterality: Left  Level: C7-T1 Imaging: Fluoroscopy-assisted DOS: 07/04/2023  Performed by: Delano Metz, MD Anesthesia: Local anesthesia (1-2% Lidocaine) Anxiolysis: IV Versed         Sedation:                           Purpose: Diagnostic/Therapeutic Indications: Cervicalgia, cervical radicular pain, degenerative disc disease, severe enough to impact quality of life or function. 1. Chronic neck pain (1ry area of Pain) (Bilateral) (L>R)   2. Chronic shoulder pain (2ry area of Pain) (Left)   3. Chronic upper extremity pain (Left)   4. DDD (degenerative disc disease), cervical   5. Grade 1 Retrolisthesis of C4/C5 & C5/C6   6. 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 Cervicogenic headache (4th area of Pain) (Bilateral)    NAS-11 score:   Pre-procedure:  /10   Post-procedure:  /10      Position  Prep  Materials:  Location setting: Procedure suite Position: Prone, on modified reverse trendelenburg to facilitate breathing, with head in head-cradle. Pillows  positioned under chest (below chin-level) with cervical spine flexed. Safety Precautions: Patient was assessed for positional comfort and pressure points before starting the procedure. Prepping solution: DuraPrep (Iodine Povacrylex [0.7% available iodine] and Isopropyl Alcohol, 74% w/w) Prep Area: Entire  cervicothoracic region Approach: percutaneous, paramedial Intended target: Posterior cervical epidural space Materials Procedure:  Tray: Epidural Needle(s): Epidural (Tuohy) Qty: 1 Length: (90mm) 3.5-inch Gauge: 17G   H&P (Pre-op Assessment):  Monica Gonzalez is a 47 y.o. (year old), female patient, seen today for interventional treatment. She  has a past surgical history that includes Tubal ligation; Tonsillectomy; Vulvectomy (N/A, 07/29/2020); Colonoscopy with propofol (N/A, 02/17/2023); and Esophagogastroduodenoscopy (egd) with propofol (N/A, 02/17/2023). Monica Gonzalez has a current medication list which includes the following prescription(s): albuterol, albuterol, albuterol, vitamin c, benzonatate, brompheniramine-pseudoephedrine-dm, celecoxib, citalopram, clonazepam, cyclobenzaprine, famotidine, felodipine, ferrous sulfate, trelegy ellipta, gabapentin, ibuprofen, ipratropium-albuterol, lidocaine, methylprednisolone, pantoprazole, polyethylene glycol, tranexamic acid, and valacyclovir. Her primarily concern today is the No chief complaint on file.  Initial Vital Signs:  Pulse/HCG Rate:    Temp:   Resp:   BP:   SpO2:    BMI: Estimated body mass index is 38.78 kg/m as calculated from the following:   Height as of 06/28/23: 4\' 11"  (1.499 m).   Weight as of 06/28/23: 192 lb (87.1 kg).  Risk Assessment: Allergies: Reviewed. She is allergic to prednisone.  Allergy Precautions: None required Coagulopathies: Reviewed. None identified.  Blood-thinner therapy: None at this time Active Infection(s): Reviewed. None identified. Monica Gonzalez is afebrile  Site Confirmation: Monica Gonzalez was asked to confirm the  procedure and laterality before marking the site Procedure checklist: Completed Consent: Before the procedure and under the influence of  no sedative(s), amnesic(s), or anxiolytics, the patient was informed of the treatment options, risks and possible complications. To fulfill our ethical and legal obligations, as recommended by the American Medical Association's Code of Ethics, I have informed the patient of my clinical impression; the nature and purpose of the treatment or procedure; the risks, benefits, and possible complications of the intervention; the alternatives, including doing nothing; the risk(s) and benefit(s) of the alternative treatment(s) or procedure(s); and the risk(s) and benefit(s) of doing nothing. The patient was provided information about the general risks and possible complications associated with the procedure. These may include, but are not limited to: failure to achieve desired goals, infection, bleeding, organ or nerve damage, allergic reactions, paralysis, and death. In addition, the patient was informed of those risks and complications associated to Spine-related procedures, such as failure to decrease pain; infection (i.e.: Meningitis, epidural or intraspinal abscess); bleeding (i.e.: epidural hematoma, subarachnoid hemorrhage, or any other type of intraspinal or peri-dural bleeding); organ or nerve damage (i.e.: Any type of peripheral nerve, nerve root, or spinal cord injury) with subsequent damage to sensory, motor, and/or autonomic systems, resulting in permanent pain, numbness, and/or weakness of one or several areas of the body; allergic reactions; (i.e.: anaphylactic reaction); and/or death. Furthermore, the patient was informed of those risks and complications associated with the medications. These include, but are not limited to: allergic reactions (i.e.: anaphylactic or anaphylactoid reaction(s)); adrenal axis suppression; blood sugar elevation that in diabetics may result  in ketoacidosis or comma; water retention that in patients with history of congestive heart failure may result in shortness of breath, pulmonary edema, and decompensation with resultant heart failure; weight gain; swelling or edema; medication-induced neural toxicity; particulate matter embolism and blood vessel occlusion with resultant organ, and/or nervous system infarction; and/or aseptic necrosis of one or more joints. Finally, the patient was informed that Medicine is not an exact science; therefore, there is also the possibility of unforeseen or unpredictable risks and/or possible complications that may result in a catastrophic outcome. The patient indicated having understood very clearly. We have given the patient no guarantees and we have made no promises. Enough time was given to the patient to ask questions, all of which were answered to the patient's satisfaction. Ms. Sharif has indicated that she wanted to continue with the procedure. Attestation: I, the ordering provider, attest that I have discussed with the patient the benefits, risks, side-effects, alternatives, likelihood of achieving goals, and potential problems during recovery for the procedure that I have provided informed consent. Date  Time: {CHL [facility]IN TIME CHOICES:[id number]}   Pre-Procedure Preparation:  Monitoring: As per clinic protocol. Respiration, ETCO2, SpO2, BP, heart rate and rhythm monitor placed and checked for adequate function Safety Precautions: Patient was assessed for positional comfort and pressure points before starting the procedure. Time-out: I initiated and conducted the "Time-out" before starting the procedure, as per protocol. The patient was asked to participate by confirming the accuracy of the "Time Out" information. Verification of the correct person, site, and procedure were performed and confirmed by me, the nursing staff, and the patient. "Time-out" conducted as per Joint Commission's Universal Protocol  (UP.01.01.01). Time:   Start Time:   hrs.  Description  Narrative of Procedure:          Rationale (medical necessity): procedure needed and proper for the diagnosis and/or treatment of the patient's medical symptoms and needs. Start Time:   hrs. Safety Precautions: Aspiration looking for blood return was conducted prior to all injections. At  no point did we inject any substances, as a needle was being advanced. No attempts were made at seeking any paresthesias. Safe injection practices and needle disposal techniques used. Medications properly checked for expiration dates. SDV (single dose vial) medications used. Description of procedure: Protocol guidelines were followed. The patient was assisted into a comfortable position. The target area was identified and the area prepped in the usual manner. Skin & deeper tissues infiltrated with local anesthetic. Appropriate amount of time allowed to pass for local anesthetics to take effect. Using fluoroscopic guidance, the epidural needle was introduced through the skin, ipsilateral to the reported pain, and advanced to the target area. Posterior laminar os was contacted and the needle walked caudad, until the lamina was cleared. The ligamentum flavum was engaged and the epidural space identified using "loss-of-resistance technique" with 2-3 ml of PF-NaCl (0.9% NSS), in a 5cc dedicated LOR syringe. (See "Imaging guidance" below for use of contrast details.) Once proper needle placement was secured, and negative aspiration confirmed, the solution was injected in intermittent fashion, asking for systemic symptoms every 0.5cc. The needles were then removed and the area cleansed, making sure to leave some of the prepping solution back to take advantage of its long term bactericidal properties.  There were no vitals filed for this visit.   End Time:   hrs.  Imaging Guidance (Spinal):          Type of Imaging Technique: Fluoroscopy Guidance  (Spinal) Indication(s): Assistance in needle guidance and placement for procedures requiring needle placement in or near specific anatomical locations not easily accessible without such assistance. Exposure Time: Please see nurses notes. Contrast: Before injecting any contrast, we confirmed that the patient did not have an allergy to iodine, shellfish, or radiological contrast. Once satisfactory needle placement was completed at the desired level, radiological contrast was injected. Contrast injected under live fluoroscopy. No contrast complications. See chart for type and volume of contrast used. Fluoroscopic Guidance: I was personally present during the use of fluoroscopy. "Tunnel Vision Technique" used to obtain the best possible view of the target area. Parallax error corrected before commencing the procedure. "Direction-depth-direction" technique used to introduce the needle under continuous pulsed fluoroscopy. Once target was reached, antero-posterior, oblique, and lateral fluoroscopic projection used confirm needle placement in all planes. Images permanently stored in EMR. Interpretation: I personally interpreted the imaging intraoperatively. Adequate needle placement confirmed in multiple planes. Appropriate spread of contrast into desired area was observed. No evidence of afferent or efferent intravascular uptake. No intrathecal or subarachnoid spread observed. Permanent images saved into the patient's record.  Post-operative Assessment:  Post-procedure Vital Signs:  Pulse/HCG Rate:    Temp:   Resp:   BP:   SpO2:    EBL: None  Complications: No immediate post-treatment complications observed by team, or reported by patient.  Note: The patient tolerated the entire procedure well. A repeat set of vitals were taken after the procedure and the patient was kept under observation following institutional policy, for this type of procedure. Post-procedural neurological assessment was performed,  showing return to baseline, prior to discharge. The patient was provided with post-procedure discharge instructions, including a section on how to identify potential problems. Should any problems arise concerning this procedure, the patient was given instructions to immediately contact us, at any time, without hesitation. In any case, we plan to contact the patient by telephone for a follow-up status report regarding this interventional procedure.  Comments:  No additional relevant information.  Plan of Care (POC)  Orders:  No orders of the defined types were placed in this encounter.  Chronic Opioid Analgesic:  No chronic opioid analgesics therapy prescribed by our practice. None MME/day: 0 mg/day   Medications ordered for procedure: No orders of the defined types were placed in this encounter.  Medications administered: Naesha M. Moylan had no medications administered during this visit.  See the medical record for exact dosing, route, and time of administration.  Follow-up plan:   No follow-ups on file.       Interventional Therapies  Risk Factors  Considerations  Medical Comorbidities:  Allergy: Prednisone (swelling and SOB)    Planned  Pending:   Diagnostic left cervical ESI #1    Under consideration:   Diagnostic left cervical ESI #1    Completed:   None at this time   Therapeutic  Palliative (PRN) options:   None established   Completed by other providers:   None reported        Recent Visits Date Type Provider Dept  06/07/23 Office Visit Delano Metz, MD [facility]  05/22/23 Office Visit Delano Metz, MD [facility]  Showing recent visits within past 90 days and meeting all other requirements Future Appointments Date Type Provider Dept  07/04/23 Appointment Delano Metz, MD [facility]  Showing future appointments within next 90 days and meeting all other requirements  Disposition: Discharge home   Discharge (Date  Time): 07/04/2023;   hrs.   Primary Care Physician: Mick Sell, MD Location: [facility] Note by: Oswaldo Done, MD (TTS technology used. I apologize for any typographical errors that were not detected and corrected.) Date: 07/04/2023; Time: 5:49 AM  Disclaimer:  Medicine is not an Visual merchandiser. The only guarantee in medicine is that nothing is guaranteed. It is important to note that the decision to proceed with this intervention was based on the information collected from the patient. The Data and conclusions were drawn from the patient's questionnaire, the interview, and the physical examination. Because the information was provided in large part by the patient, it cannot be guaranteed that it has not been purposely or unconsciously manipulated. Every effort has been made to obtain as much relevant data as possible for this evaluation. It is important to note that the conclusions that lead to this procedure are derived in large part from the available data. Always take into account that the treatment will also be dependent on availability of resources and existing treatment guidelines, considered by other Pain Management Practitioners as being common knowledge and practice, at the time of the intervention. For Medico-Legal purposes, it is also important to point out that variation in procedural techniques and pharmacological choices are the acceptable norm. The indications, contraindications, technique, and results of the above procedure should only be interpreted and judged by a Board-Certified Interventional Pain Specialist with extensive familiarity and expertise in the same exact procedure and technique.

## 2023-07-04 ENCOUNTER — Ambulatory Visit (HOSPITAL_BASED_OUTPATIENT_CLINIC_OR_DEPARTMENT_OTHER): Payer: 59 | Admitting: Pain Medicine

## 2023-07-04 DIAGNOSIS — M5030 Other cervical disc degeneration, unspecified cervical region: Secondary | ICD-10-CM

## 2023-07-04 DIAGNOSIS — M4310 Spondylolisthesis, site unspecified: Secondary | ICD-10-CM

## 2023-07-04 DIAGNOSIS — Z91199 Patient's noncompliance with other medical treatment and regimen due to unspecified reason: Secondary | ICD-10-CM

## 2023-07-04 DIAGNOSIS — Z5189 Encounter for other specified aftercare: Secondary | ICD-10-CM

## 2023-07-04 DIAGNOSIS — T380X5S Adverse effect of glucocorticoids and synthetic analogues, sequela: Secondary | ICD-10-CM | POA: Insufficient documentation

## 2023-07-04 DIAGNOSIS — M4802 Spinal stenosis, cervical region: Secondary | ICD-10-CM

## 2023-07-04 DIAGNOSIS — G8929 Other chronic pain: Secondary | ICD-10-CM

## 2023-07-04 DIAGNOSIS — G4486 Cervicogenic headache: Secondary | ICD-10-CM

## 2023-07-16 IMAGING — CT CT ABD-PELV W/ CM
2 of 5 series · 15 of 46 positions shown, 17 images · IV contrast (APPLIED)
Comparison: 03/25/2014

CLINICAL DATA: Abdominal pain in the left upper quadrant.

EXAM:
CT ABDOMEN AND PELVIS WITH CONTRAST
TECHNIQUE: Multidetector CT imaging of the abdomen and pelvis was performed
using the standard protocol following bolus administration of
intravenous contrast.

[Series 2: routine abd/pel with · axial · 0.81mm/px · z∈[-863,-413]mm · 12 of 102 slices shown, 14 images]
[im 6/102  soft-tissue]
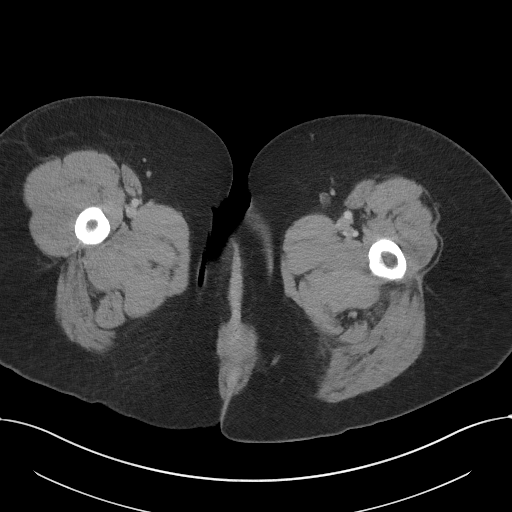
[im 6/102  bone]
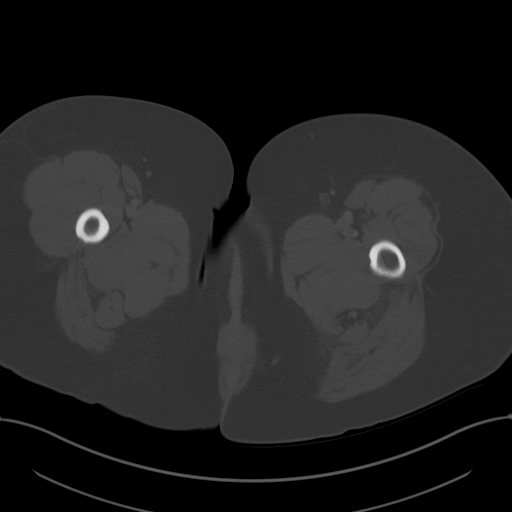
[im 17/102  soft-tissue]
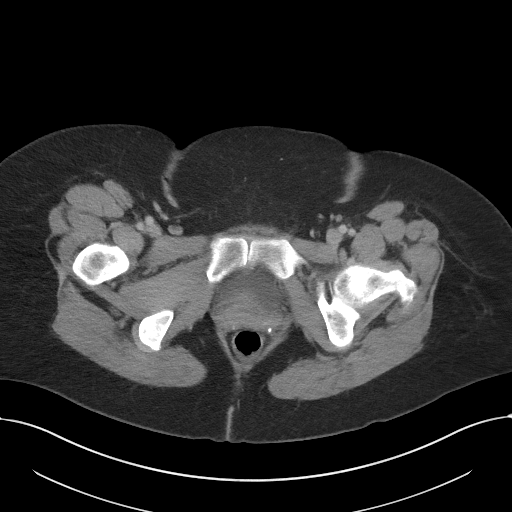
[im 23/102  soft-tissue]
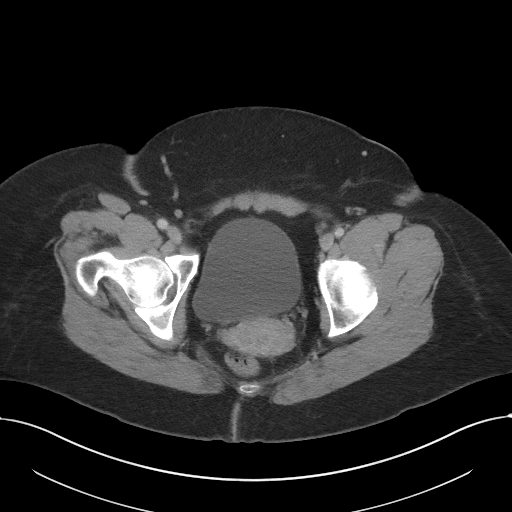
[im 29/102  soft-tissue]
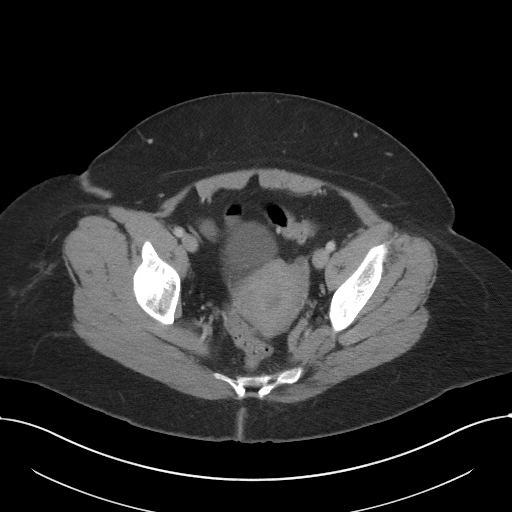
[im 40/102  soft-tissue]
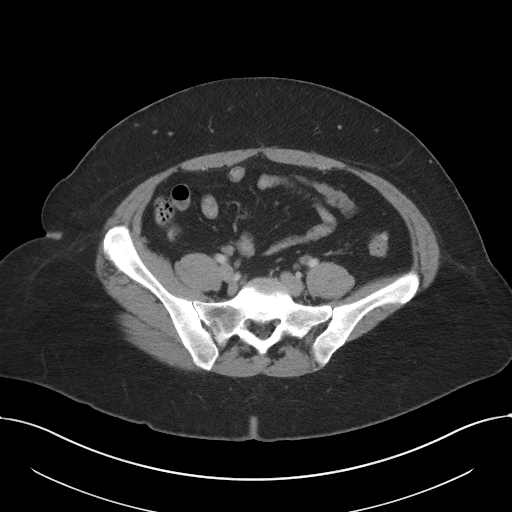
[im 45/102  soft-tissue]
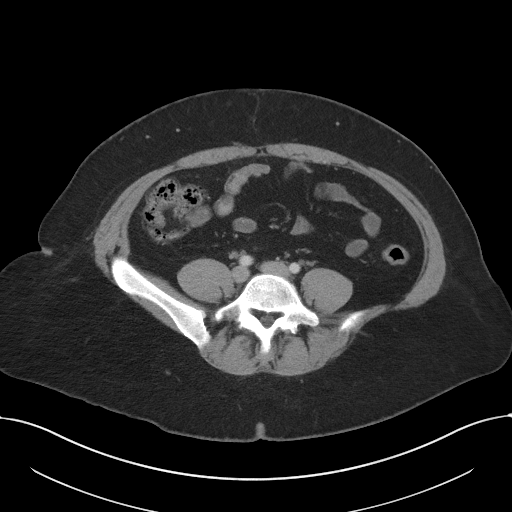
[im 57/102  soft-tissue]
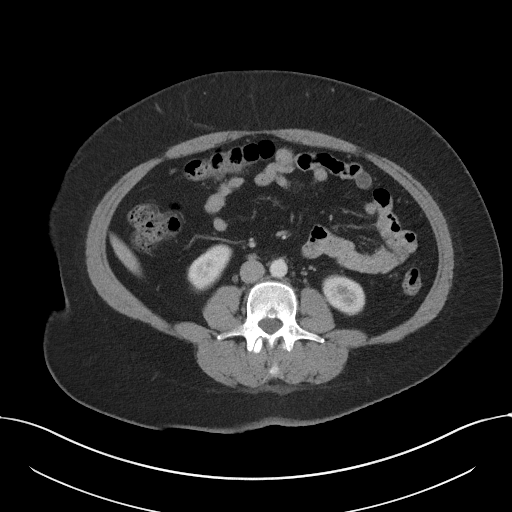
[im 62/102  soft-tissue]
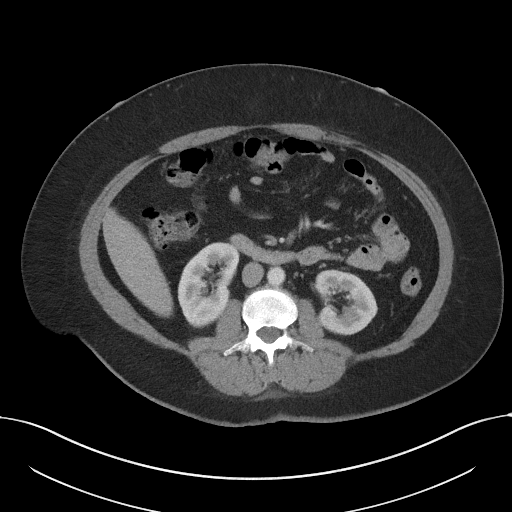
[im 73/102  soft-tissue]
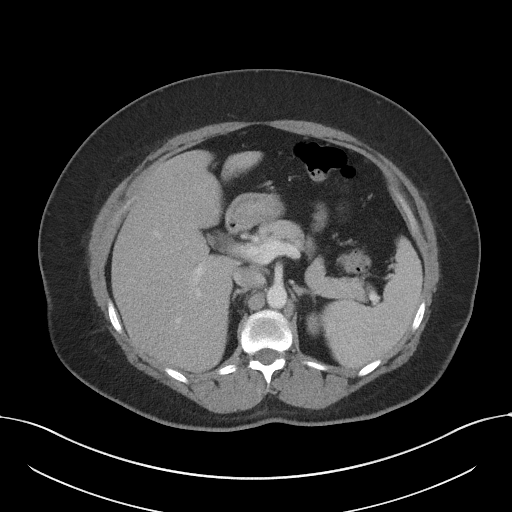
[im 73/102  bone]
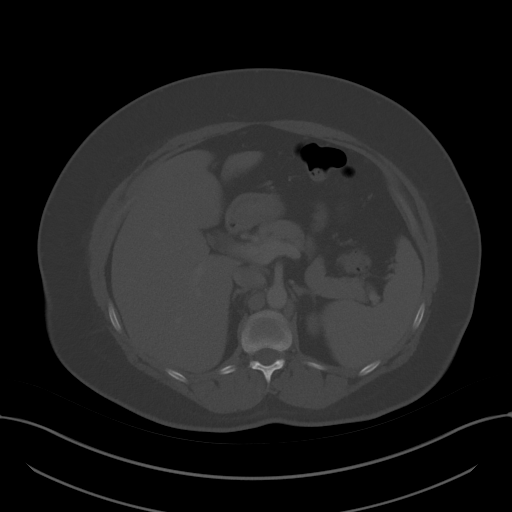
[im 79/102  soft-tissue]
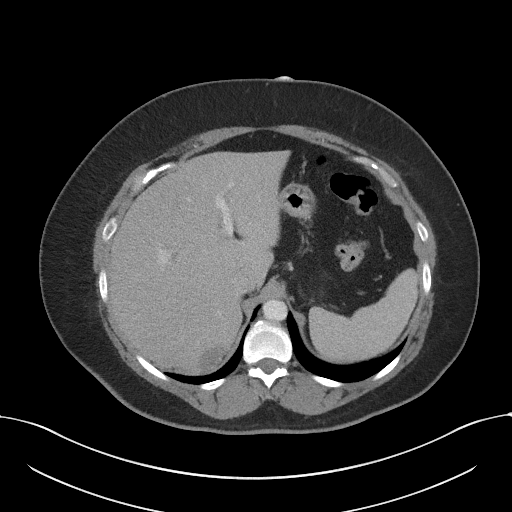
[im 85/102  soft-tissue]
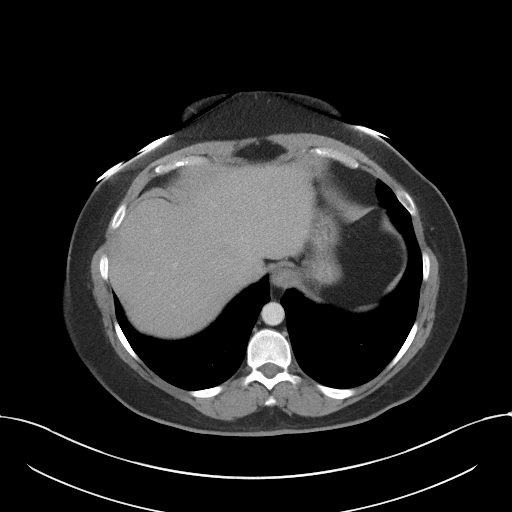
[im 96/102  soft-tissue]
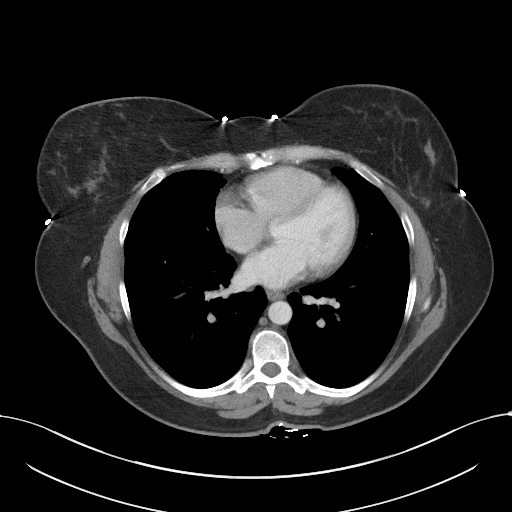

[Series 5: coronal st · coronal · 0.80mm/px · 3 of 104 slices shown]
[im 35/104  soft-tissue]
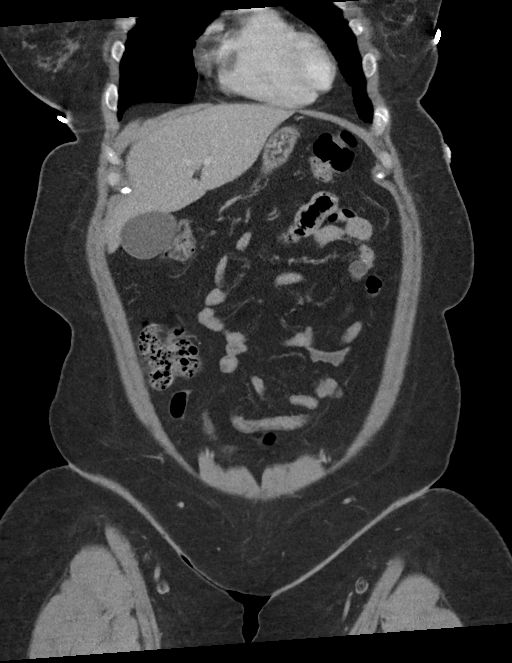
[im 46/104  soft-tissue]
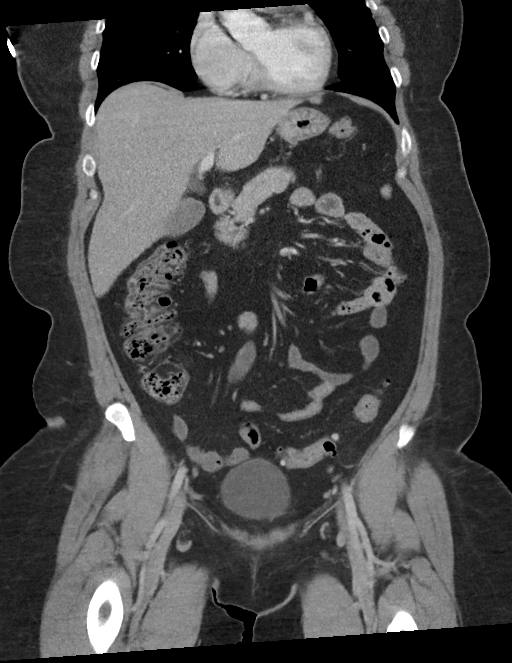
[im 58/104  soft-tissue]
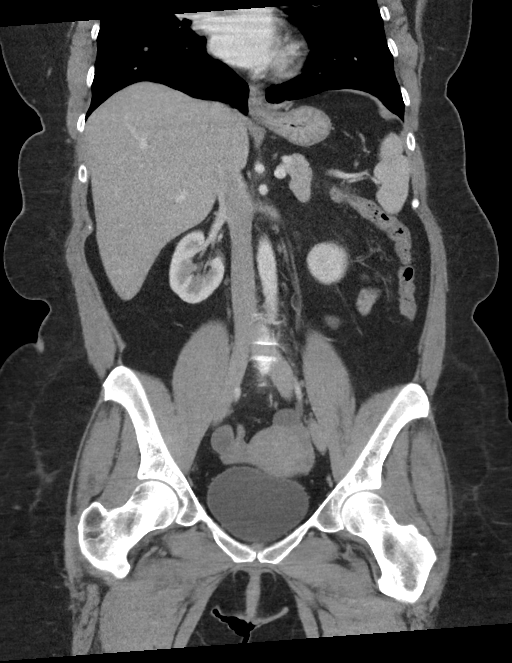

[15 of 46 positions shown; findings below may reference images not displayed]

RADIATION DOSE REDUCTION: This exam was performed according to the
departmental dose-optimization program which includes automated
exposure control, adjustment of the mA and/or kV according to
patient size and/or use of iterative reconstruction technique.

CONTRAST:  100mL OMNIPAQUE IOHEXOL 300 MG/ML  SOLN
FINDINGS: Lower chest: Unremarkable.

Hepatobiliary: 3 cm subcapsular lesion in the posterior right liver
is stable in the long interval since prior study. This appears to
have some subtle peripheral nodular enhancement on image [DATE].
Lesion probably represents cavernous hemangioma. Liver otherwise
unremarkable. Adenoma mitosis noted gallbladder fundus. No
intrahepatic or extrahepatic biliary dilation.

Pancreas: No focal mass lesion. No dilatation of the main duct. No
intraparenchymal cyst. No peripancreatic edema.

Spleen: No splenomegaly. No focal mass lesion.

Adrenals/Urinary Tract: No adrenal nodule or mass. Kidneys
unremarkable. No evidence for hydroureter. The urinary bladder
appears normal for the degree of distention.

Stomach/Bowel: Stomach is unremarkable. No gastric wall thickening.
No evidence of outlet obstruction. Duodenum is normally positioned
as is the ligament of Treitz. No small bowel wall thickening. No
small bowel dilatation. The terminal ileum is normal. The appendix
is normal. No gross colonic mass. No colonic wall thickening.
Diverticular changes are noted in the left colon without evidence of
diverticulitis.

Vascular/Lymphatic: No abdominal aortic aneurysm. No abdominal
aortic atherosclerotic calcification. Insert no abdominal
lymphadenopathy No pelvic sidewall lymphadenopathy.

Reproductive: The uterus is unremarkable.  There is no adnexal mass.

Other: No intraperitoneal free fluid.

Musculoskeletal: No worrisome lytic or sclerotic osseous
abnormality.
IMPRESSION: 1. No acute findings in the abdomen or pelvis. Specifically, no
findings to explain the patient's history of left upper quadrant
pain.
2. 3 cm subcapsular lesion in the posterior right liver is stable in
the long interval since prior study consistent with benign etiology
and probably represents cavernous hemangioma.
3. Left colonic diverticulosis without diverticulitis.

## 2023-07-19 ENCOUNTER — Telehealth: Payer: Self-pay

## 2023-07-19 NOTE — Telephone Encounter (Signed)
Insurance Treatment Denial Note  Date order was entered:  Order entered by: Delano Metz, MD Requested treatment: Cesi Reason for denial: Documentation of pertinent physical exam is missing. Recommended for approval:  see below   The letter states the doctors exam does not show pain, numbness, tingling, and or weakness from the spine, arms or legs in a pattern related to the level of the spine to be treated.

## 2023-07-20 NOTE — Progress Notes (Unsigned)
PROVIDER NOTE: Information contained herein reflects review and annotations entered in association with encounter. Interpretation of such information and data should be left to medically-trained personnel. Information provided to patient can be located elsewhere in the medical record under "Patient Instructions". Document created using STT-dictation technology, any transcriptional errors that may result from process are unintentional.    Patient: Monica Gonzalez  Service Category: E/M  Provider: Oswaldo Done, MD  DOB: [birth year]  DOS: 07/24/2023  Referring Provider: Mick Sell, MD  MRN: [id number]  Specialty: Interventional Pain Management  PCP: Monica Sell, MD  Type: Established Patient  Setting: Ambulatory outpatient    Location: Office  Delivery: Face-to-face     HPI  [name], a 47 y.o. year old female, is here today because of her Cervical spinal stenosis [M48.02]. [name]'s primary complain today is No chief complaint on file.  Pertinent problems: 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 Pain Assessment: Severity of   is reported as a  /10. Location:    / .  Onset:  . Quality:  . Timing:  . Modifying factor(s):  Marland Kitchen Vitals:  vitals were not taken for this visit.  BMI: Estimated body mass index is 38.78 kg/m as calculated from the following:   Height as of 06/28/23: 4\' 11"  (1.499 m).   Weight as of 06/28/23: 192 lb (87.1 kg). Last encounter: 06/07/2023. Last procedure: 07/04/2023.  Reason for encounter: evaluation for possible interventional PM therapy/treatment. ***  Pharmacotherapy Assessment  Analgesic: No chronic opioid analgesics therapy prescribed by our practice. None MME/day: 0 mg/day   Monitoring: [place] PMP: PDMP reviewed during this encounter.       Pharmacotherapy: No side-effects or adverse reactions reported. Compliance: No problems identified. Effectiveness: Clinically acceptable.  No notes on file  No results found for: "CBDTHCR" No results found for: "D8THCCBX" No results found for: "D9THCCBX"  UDS:  Summary  Date Value Ref Range Status  05/22/2023 Note  Final    Comment:    ==================================================================== Compliance Drug Analysis, Ur ==================================================================== Test                             Result       Flag       Units  Drug Present and Declared for Prescription Verification   7-aminoclonazepam              168          EXPECTED   ng/mg creat    7-aminoclonazepam is an expected metabolite of clonazepam. Source of    clonazepam is a scheduled prescription medication.    Citalopram                     PRESENT      EXPECTED  Desmethylcitalopram            PRESENT      EXPECTED    Desmethylcitalopram is an expected metabolite of citalopram or the    enantiomeric form, escitalopram.  Drug Present not Declared for Prescription Verification   Naproxen                       PRESENT      UNEXPECTED  Drug Absent but Declared for Prescription Verification   Hydrocodone                    Not Detected UNEXPECTED ng/mg creat    Ephedrine/Pseudoephedrine      Not Detected UNEXPECTED   Gabapentin                     Not Detected UNEXPECTED   Cyclobenzaprine                Not Detected UNEXPECTED   Methocarbamol                  Not Detected UNEXPECTED   Acetaminophen                  Not Detected UNEXPECTED    Acetaminophen, as indicated in the declared medication list, is not    always detected even when used as directed.    Ibuprofen                      Not Detected UNEXPECTED    Ibuprofen, as indicated in the declared medication list, is not    always detected even when used as directed.    Brompheniramine                Not Detected UNEXPECTED   Chlorpheniramine               Not Detected UNEXPECTED ==================================================================== Test                      Result    Flag   Units      Ref Range   Creatinine              40               mg/dL      >=16 ==================================================================== Declared Medications:  The flagging and interpretation on this report are based on the  following declared medications.  Unexpected results may arise from  inaccuracies in the declared medications.   **Note: The testing scope of this panel includes these medications:   Brompheniramine  Chlorpheniramine (Tussionex)  Citalopram (Celexa)  Clonazepam (Klonopin)  Cyclobenzaprine (Flexeril)  Gabapentin (Neurontin)  Hydrocodone (Tussionex)  Hydrocodone (Norco)  Methocarbamol (Robaxin)  Pseudoephedrine   **Note: The testing scope of this panel does not include small to  moderate amounts of these reported medications:   Acetaminophen (Norco)  Ibuprofen (Advil)   **Note: The testing scope of this panel does not include the  following reported medications:   Albuterol (Ventolin HFA)  Albuterol (Duoneb)  Amoxicillin (Augmentin)  Benzonatate (Tessalon)  Celecoxib (Celebrex)  Clavulanate (Augmentin)  Famotidine (Pepcid)  Felodipine (Plendil)   Fluticasone (Trelegy)  Ipratropium (Duoneb)  Iron  Ketorolac (Toradol)  Methylprednisolone (Medrol)  Ondansetron (Zofran)  Pantoprazole (Protonix)  Polyethylene Glycol (NuLYTELY)  Tranexamic Acid (Lysteda)  Umeclidinium (Trelegy)  Valacyclovir (Valtrex)  Vilanterol (Trelegy)  Vitamin C ==================================================================== For clinical consultation, please call [phone]. ====================================================================       ROS  Constitutional: Denies any fever or chills Gastrointestinal: No reported hemesis, hematochezia, vomiting, or acute GI distress Musculoskeletal: Denies any acute onset joint swelling, redness, loss of ROM, or weakness Neurological: No reported episodes of acute onset apraxia, aphasia, dysarthria, agnosia, amnesia, paralysis, loss of coordination, or loss of consciousness  Medication Review  Fluticasone-Umeclidin-Vilant, Vitamin C, albuterol, benzonatate, brompheniramine-pseudoephedrine-DM, celecoxib, citalopram, clonazePAM, cyclobenzaprine, famotidine, felodipine, ferrous sulfate, gabapentin, ibuprofen, ipratropium-albuterol, methylPREDNISolone, pantoprazole, polyethylene glycol, tranexamic acid, and valACYclovir  History Review  Allergy: Ms. Fretz is allergic to prednisone. Drug: Ms. Gardinier  reports no history of drug use. Alcohol:  reports that she does not currently use alcohol. Tobacco:  reports that she has never smoked. She has never used smokeless tobacco. Social: Ms. Minette  reports that she has never smoked. She has never used smokeless tobacco. She reports that she does not currently use alcohol. She reports that she does not use drugs. Medical:  has a past medical history of Anemia, Asthma, Fibromyalgia, GERD (gastroesophageal reflux disease), Heart murmur, History of hiatal hernia, and Lupus (HCC). Surgical: Ms. Traina  has a past surgical history that includes Tubal ligation; Tonsillectomy;  Vulvectomy (N/A, 07/29/2020); Colonoscopy with propofol (N/A, 02/17/2023); and Esophagogastroduodenoscopy (egd) with propofol (N/A, 02/17/2023). Family: family history includes Congestive Heart Failure in her mother; Diabetes in her mother.  Laboratory Chemistry Profile   Renal Lab Results  Component Value Date   BUN 9 06/28/2023   CREATININE 0.84 06/28/2023   GFRAA >60 07/27/2020   GFRNONAA >60 06/28/2023    Hepatic Lab Results  Component Value Date   AST 28 11/27/2022   ALT 24 11/27/2022   ALBUMIN 3.4 (L) 11/27/2022   ALKPHOS 72 11/27/2022   LIPASE 29 12/26/2021    Electrolytes Lab Results  Component Value Date   NA 139 06/28/2023   K 3.7 06/28/2023   CL 108 06/28/2023   CALCIUM 8.6 (L) 06/28/2023   MG 2.0 05/24/2023    Bone Lab Results  Component Value Date   25OHVITD1 26 (L) 05/24/2023   25OHVITD2 <1.0 05/24/2023   25OHVITD3 26 05/24/2023    Inflammation (CRP: Acute Phase) (ESR: Chronic Phase) Lab Results  Component Value Date   CRP 0.7 05/24/2023   ESRSEDRATE 6 05/24/2023         Note: Above Lab results reviewed.  Recent Imaging Review  DG Chest 2 View CLINICAL DATA:  Chest pain. Recent diagnosis of bronchitis. Persistent shortness of breath despite treatment.  EXAM: CHEST - 2 VIEW  COMPARISON:  Two-view chest x-ray 06/22/2023.  FINDINGS: The heart size is within normal limits. Lung volumes are low. No focal airspace disease is present. No edema or effusion is present. The visualized soft tissues and bony thorax are unremarkable.  IMPRESSION: 1. Low lung volumes. 2. No acute cardiopulmonary disease.  Electronically Signed   By: Marin Roberts M.D.   On: 06/28/2023 16:20 Note: Reviewed        Physical Exam  General appearance: Well nourished, well developed, and well hydrated. In no apparent acute distress Mental status: Alert, oriented x 3 (person, place, & time)       Respiratory: No evidence of acute respiratory distress Eyes:  PERLA Vitals: There were no vitals taken for this visit. BMI: Estimated body mass index is 38.78 kg/m as calculated from the following:   Height as of 06/28/23: 4\' 11"  (1.499 m).   Weight as of 06/28/23: 192 lb (87.1 kg). Ideal: Patient weight not recorded  Assessment   Diagnosis Status  1. 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, C5-6)   2. 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 Cervical sensory radiculopathy (Left)   4. Cervicogenic headache (4th area of Pain) (Bilateral)   5. Chronic neck pain (1ry area of Pain) (Bilateral) (L>R)   6. Chronic shoulder pain (2ry area of Pain) (Left)   7. Chronic upper extremity pain (Left)   8. DDD (degenerative disc disease), cervical   9. Grade 1 Retrolisthesis of C4/C5 & C5/C6   10. History of Prednisone Allergy    Controlled Controlled Controlled   Updated Problems: No problems updated.  Plan of Care  Problem-specific:  No problem-specific Assessment & Plan notes found for this encounter.  Ms. Elycia Rothschild Bruemmer has a current medication list which includes the following long-term medication(s): albuterol, albuterol, albuterol, citalopram, clonazepam, felodipine, ferrous sulfate, gabapentin, ipratropium-albuterol, and pantoprazole.  Pharmacotherapy (Medications Ordered): No orders of the defined types were placed in this encounter.  Orders:  No orders of the defined types were placed in this encounter.  Follow-up plan:   No follow-ups on file.      Interventional Therapies  Risk Factors  Considerations  Medical Comorbidities:  Allergy: Prednisone (swelling and SOB)    Planned  Pending:   Diagnostic left cervical ESI #1    Under consideration:   Diagnostic left cervical ESI #1    Completed:   None at this time   Therapeutic  Palliative (PRN) options:   None established   Completed by other providers:   None reported        Recent Visits Date Type Provider Dept  06/07/23 Office Visit Delano Metz, MD [facility]  05/22/23 Office Visit Delano Metz, MD [facility]  Showing recent visits within past 90 days and meeting all other requirements Future Appointments Date Type Provider Dept  07/24/23 Appointment Delano Metz, MD [facility]  Showing future appointments within next 90 days and meeting all other requirements  I discussed the assessment and treatment plan with the patient. The patient was provided an opportunity to ask questions and all were answered. The patient agreed with the plan and demonstrated an understanding of the instructions.  Patient advised to call back or seek an in-person evaluation if the symptoms or condition worsens.  Duration of encounter: *** minutes.  Total time on encounter, as per AMA guidelines included both the face-to-face and non-face-to-face time personally spent by the physician and/or other qualified health care professional(s) on the day of the encounter (includes time in activities that require the physician or other qualified health care professional and does not include time in activities normally performed by clinical staff). Physician's time may include the following activities when performed: Preparing to see the patient (e.g., pre-charting review of records, searching for previously ordered imaging, lab work, and nerve conduction tests) Review of prior analgesic pharmacotherapies. Reviewing PMP Interpreting ordered tests (e.g., lab work, imaging, nerve conduction tests) Performing post-procedure evaluations, including interpretation of diagnostic procedures Obtaining and/or reviewing separately obtained history Performing a medically appropriate examination and/or evaluation Counseling and educating the patient/family/caregiver Ordering medications, tests, or procedures Referring and communicating with other health care professionals (when not separately reported) Documenting clinical  information in the electronic or other health record Independently interpreting results (not separately reported) and communicating results to the patient/ family/caregiver Care coordination (not separately reported)  Note by: Oswaldo Done, MD Date: 07/24/2023; Time: 2:25 PM

## 2023-07-24 ENCOUNTER — Ambulatory Visit: Payer: 59 | Attending: Pain Medicine | Admitting: Pain Medicine

## 2023-07-24 ENCOUNTER — Encounter: Payer: Self-pay | Admitting: Pain Medicine

## 2023-07-24 VITALS — BP 103/56 | HR 7 | Temp 97.6°F | Resp 16 | Ht 59.0 in | Wt 189.0 lb

## 2023-07-24 DIAGNOSIS — M5030 Other cervical disc degeneration, unspecified cervical region: Secondary | ICD-10-CM | POA: Diagnosis present

## 2023-07-24 DIAGNOSIS — M5382 Other specified dorsopathies, cervical region: Secondary | ICD-10-CM | POA: Diagnosis present

## 2023-07-24 DIAGNOSIS — M79602 Pain in left arm: Secondary | ICD-10-CM

## 2023-07-24 DIAGNOSIS — G8929 Other chronic pain: Secondary | ICD-10-CM | POA: Diagnosis present

## 2023-07-24 DIAGNOSIS — G4486 Cervicogenic headache: Secondary | ICD-10-CM | POA: Diagnosis present

## 2023-07-24 DIAGNOSIS — M4802 Spinal stenosis, cervical region: Secondary | ICD-10-CM

## 2023-07-24 DIAGNOSIS — M5412 Radiculopathy, cervical region: Secondary | ICD-10-CM

## 2023-07-24 DIAGNOSIS — M542 Cervicalgia: Secondary | ICD-10-CM | POA: Diagnosis present

## 2023-07-24 DIAGNOSIS — M25512 Pain in left shoulder: Secondary | ICD-10-CM | POA: Diagnosis present

## 2023-07-24 DIAGNOSIS — T380X5S Adverse effect of glucocorticoids and synthetic analogues, sequela: Secondary | ICD-10-CM | POA: Diagnosis present

## 2023-07-24 DIAGNOSIS — R937 Abnormal findings on diagnostic imaging of other parts of musculoskeletal system: Secondary | ICD-10-CM

## 2023-07-24 DIAGNOSIS — M4310 Spondylolisthesis, site unspecified: Secondary | ICD-10-CM | POA: Diagnosis present

## 2023-07-24 DIAGNOSIS — M4312 Spondylolisthesis, cervical region: Secondary | ICD-10-CM

## 2023-07-24 NOTE — Patient Instructions (Signed)
______________________________________________________________________    OTC Supplements: The following over-the-counter (OTC) supplements may be of some benefits when used in moderation in some chronic pain conditions. Note: Always consult with your primary care provider and/or pharmacist before taking any OTC medications to make sure there are no "drug-to-drug" interactions with the medications you currently take. Ask your physician which may be beneficial for your particular condition.  Supplement Possible benefit May be of benefit in treatment of   Turmeric/curcumin* anti-inflammatory Joint and muscle aches and pain associated with arthritis and inflammation  Glucosamine/chondroitin (triple strength)* may slow loss of articular cartilage Osteoarthritis  Vitamin D-3* may suppress release of chemicals associated with inflammation Joint and muscle aches and pain associated with arthritis and inflammation  Moringa* anti-inflammatory with mild analgesic effects Joint and muscle aches and pain associated with arthritis and inflammation  Melatonin* Helps reset sleep cycle. Insomnia. May also be helpful in neurodegenerative disorders  Vitamin B-12* may help keep nerves and blood cells healthy as well as maintaining function of nervous system Neuropathies. Nerve pain (Burning pain)  Alpha-Lipoic-Acid (ALA)* antioxidant that may help with nerve health, pain, and blocking the activation of some inflammatory chemicals Diabetic neuropathy and metabolic syndrome       (*Always use manufacturer's recommended dosage.)  ______________________________________________________________________      ______________________________________________________________________    Procedure instructions  Stop blood-thinners  Do not eat or drink fluids (other than water) for 6 hours before your procedure  No water for 2 hours before your procedure  Take your blood pressure medicine with a sip of water  Arrive 30  minutes before your appointment  If sedation is planned, bring suitable driver. Pennie Banter, Benedetto Goad, & public transportation are NOT APPROVED)  Carefully read the "Preparing for your procedure" detailed instructions  If you have questions call us at [phone]  ______________________________________________________________________      ______________________________________________________________________    Preparing for your procedure  Appointments: If you think you may not be able to keep your appointment, call 24-48 hours in advance to cancel. We need time to make it available to others.  During your procedure appointment there will be: No Prescription Refills. No disability issues to discussed. No medication changes or discussions.  Instructions: Food intake: Avoid eating anything solid for at least 8 hours prior to your procedure. Clear liquid intake: You may take clear liquids such as water up to 2 hours prior to your procedure. (No carbonated drinks. No soda.) Transportation: Unless otherwise stated by your physician, bring a driver. (Driver cannot be a Market researcher, Pharmacist, community, or any other form of public transportation.) Morning Medicines: Except for blood thinners, take all of your other morning medications with a sip of water. Make sure to take your heart and blood pressure medicines. If your blood pressure's lower number is above 100, the case will be rescheduled. Blood thinners: Make sure to stop your blood thinners as instructed.  If you take a blood thinner, but were not instructed to stop it, call our office [phone] and ask to talk to a nurse. Not stopping a blood thinner prior to certain procedures could lead to serious complications. Diabetics on insulin: Notify the staff so that you can be scheduled 1st case in the morning. If your diabetes requires high dose insulin, take only  of your normal insulin dose the morning of the procedure and notify the staff that you have done  so. Preventing infections: Shower with an antibacterial soap the morning of your procedure.  Build-up your immune system: Take 1000  mg of Vitamin C with every meal (3 times a day) the day prior to your procedure. Antibiotics: Inform the nursing staff if you are taking any antibiotics or if you have any conditions that may require antibiotics prior to procedures. (Example: recent joint implants)   Pregnancy: If you are pregnant make sure to notify the nursing staff. Not doing so may result in injury to the fetus, including death.  Sickness: If you have a cold, fever, or any active infections, call and cancel or reschedule your procedure. Receiving steroids while having an infection may result in complications. Arrival: You must be in the facility at least 30 minutes prior to your scheduled procedure. Tardiness: Your scheduled time is also the cutoff time. If you do not arrive at least 15 minutes prior to your procedure, you will be rescheduled.  Children: Do not bring any children with you. Make arrangements to keep them home. Dress appropriately: There is always a possibility that your clothing may get soiled. Avoid long dresses. Valuables: Do not bring any jewelry or valuables.  Reasons to call and reschedule or cancel your procedure: (Following these recommendations will minimize the risk of a serious complication.) Surgeries: Avoid having procedures within 2 weeks of any surgery. (Avoid for 2 weeks before or after any surgery). Flu Shots: Avoid having procedures within 2 weeks of a flu shots or . (Avoid for 2 weeks before or after immunizations). Barium: Avoid having a procedure within 7-10 days after having had a radiological study involving the use of radiological contrast. (Myelograms, Barium swallow or enema study). Heart attacks: Avoid any elective procedures or surgeries for the initial 6 months after a "Myocardial Infarction" (Heart Attack). Blood thinners: It is imperative that you stop  these medications before procedures. Let us know if you if you take any blood thinner.  Infection: Avoid procedures during or within two weeks of an infection (including chest colds or gastrointestinal problems). Symptoms associated with infections include: Localized redness, fever, chills, night sweats or profuse sweating, burning sensation when voiding, cough, congestion, stuffiness, runny nose, sore throat, diarrhea, nausea, vomiting, cold or Flu symptoms, recent or current infections. It is specially important if the infection is over the area that we intend to treat. Heart and lung problems: Symptoms that may suggest an active cardiopulmonary problem include: cough, chest pain, breathing difficulties or shortness of breath, dizziness, ankle swelling, uncontrolled high or unusually low blood pressure, and/or palpitations. If you are experiencing any of these symptoms, cancel your procedure and contact your primary care physician for an evaluation.  Remember:  Regular Business hours are:  Monday to Thursday 8:00 AM to 4:00 PM  Provider's Schedule: Delano Metz, MD:  Procedure days: Tuesday and Thursday 7:30 AM to 4:00 PM  Edward Jolly, MD:  Procedure days: Monday and Wednesday 7:30 AM to 4:00 PM Last  Updated: 07/04/2023 ______________________________________________________________________      ______________________________________________________________________    General Risks and Possible Complications  Patient Responsibilities: It is important that you read this as it is part of your informed consent. It is our duty to inform you of the risks and possible complications associated with treatments offered to you. It is your responsibility as a patient to read this and to ask questions about anything that is not clear or that you believe was not covered in this document.  Patient's Rights: You have the right to refuse treatment. You also have the right to change your mind, even  after initially having agreed to have the treatment done.  However, under this last option, if you wait until the last second to change your mind, you may be charged for the materials used up to that point.  Introduction: Medicine is not an Visual merchandiser. Everything in Medicine, including the lack of treatment(s), carries the potential for danger, harm, or loss (which is by definition: Risk). In Medicine, a complication is a secondary problem, condition, or disease that can aggravate an already existing one. All treatments carry the risk of possible complications. The fact that a side effects or complications occurs, does not imply that the treatment was conducted incorrectly. It must be clearly understood that these can happen even when everything is done following the highest safety standards.  No treatment: You can choose not to proceed with the proposed treatment alternative. The "PRO(s)" would include: avoiding the risk of complications associated with the therapy. The "CON(s)" would include: not getting any of the treatment benefits. These benefits fall under one of three categories: diagnostic; therapeutic; and/or palliative. Diagnostic benefits include: getting information which can ultimately lead to improvement of the disease or symptom(s). Therapeutic benefits are those associated with the successful treatment of the disease. Finally, palliative benefits are those related to the decrease of the primary symptoms, without necessarily curing the condition (example: decreasing the pain from a flare-up of a chronic condition, such as incurable terminal cancer).  General Risks and Complications: These are associated to most interventional treatments. They can occur alone, or in combination. They fall under one of the following six (6) categories: no benefit or worsening of symptoms; bleeding; infection; nerve damage; allergic reactions; and/or death. No benefits or worsening of symptoms: In Medicine there  are no guarantees, only probabilities. No healthcare provider can ever guarantee that a medical treatment will work, they can only state the probability that it may. Furthermore, there is always the possibility that the condition may worsen, either directly, or indirectly, as a consequence of the treatment. Bleeding: This is more common if the patient is taking a blood thinner, either prescription or over the counter (example: Goody Powders, Fish oil, Aspirin, Garlic, etc.), or if suffering a condition associated with impaired coagulation (example: Hemophilia, cirrhosis of the liver, low platelet counts, etc.). However, even if you do not have one on these, it can still happen. If you have any of these conditions, or take one of these drugs, make sure to notify your treating physician. Infection: This is more common in patients with a compromised immune system, either due to disease (example: diabetes, cancer, human immunodeficiency virus [HIV], etc.), or due to medications or treatments (example: therapies used to treat cancer and rheumatological diseases). However, even if you do not have one on these, it can still happen. If you have any of these conditions, or take one of these drugs, make sure to notify your treating physician. Nerve Damage: This is more common when the treatment is an invasive one, but it can also happen with the use of medications, such as those used in the treatment of cancer. The damage can occur to small secondary nerves, or to large primary ones, such as those in the spinal cord and brain. This damage may be temporary or permanent and it may lead to impairments that can range from temporary numbness to permanent paralysis and/or brain death. Allergic Reactions: Any time a substance or material comes in contact with our body, there is the possibility of an allergic reaction. These can range from a mild skin rash (contact dermatitis) to a severe systemic  reaction (anaphylactic  reaction), which can result in death. Death: In general, any medical intervention can result in death, most of the time due to an unforeseen complication. ______________________________________________________________________

## 2023-07-24 NOTE — Progress Notes (Signed)
Safety precautions to be maintained throughout the outpatient stay will include: orient to surroundings, keep bed in low position, maintain call bell within reach at all times, provide assistance with transfer out of bed and ambulation.  

## 2023-07-28 ENCOUNTER — Other Ambulatory Visit: Payer: Self-pay | Admitting: Infectious Diseases

## 2023-07-28 DIAGNOSIS — R109 Unspecified abdominal pain: Secondary | ICD-10-CM

## 2023-08-02 ENCOUNTER — Ambulatory Visit
Admission: RE | Admit: 2023-08-02 | Discharge: 2023-08-02 | Disposition: A | Discharge status: Home / Self Care | Payer: BLUE CROSS/BLUE SHIELD | Source: Ambulatory Visit | Attending: Infectious Diseases | Admitting: Infectious Diseases

## 2023-08-02 DIAGNOSIS — R109 Unspecified abdominal pain: Secondary | ICD-10-CM | POA: Diagnosis present

## 2023-08-03 ENCOUNTER — Ambulatory Visit: Payer: 59 | Attending: Pain Medicine | Admitting: Pain Medicine

## 2023-08-03 DIAGNOSIS — Z91199 Patient's noncompliance with other medical treatment and regimen due to unspecified reason: Secondary | ICD-10-CM

## 2023-08-03 DIAGNOSIS — Z5189 Encounter for other specified aftercare: Secondary | ICD-10-CM

## 2023-08-03 NOTE — Patient Instructions (Signed)

## 2023-08-03 NOTE — Progress Notes (Signed)
(  08/03/2023) NO SHOW to procedure encounter. Called on day of procedure indicating she was sick and could not make it. This is the second time she does not show up to procedure, first was on (07/04/2023) and she did not call to cancel. I will not be scheduling her again. These appointments could have been used by other patients currently on waiting lists.

## 2023-09-07 ENCOUNTER — Encounter (INDEPENDENT_AMBULATORY_CARE_PROVIDER_SITE_OTHER): Payer: Self-pay

## 2023-09-08 ENCOUNTER — Other Ambulatory Visit: Payer: Self-pay | Admitting: Medical Genetics

## 2023-09-08 DIAGNOSIS — Z006 Encounter for examination for normal comparison and control in clinical research program: Secondary | ICD-10-CM

## 2023-09-09 ENCOUNTER — Other Ambulatory Visit: Payer: 59

## 2023-09-12 ENCOUNTER — Other Ambulatory Visit: Payer: 59 | Attending: Medical Genetics

## 2023-10-20 ENCOUNTER — Other Ambulatory Visit: Payer: Self-pay

## 2023-10-20 ENCOUNTER — Emergency Department
Admission: EM | Admit: 2023-10-20 | Discharge: 2023-10-20 | Disposition: A | Discharge status: Home / Self Care | Payer: 59 | Attending: Emergency Medicine | Admitting: Emergency Medicine

## 2023-10-20 ENCOUNTER — Emergency Department: Payer: 59

## 2023-10-20 DIAGNOSIS — M25511 Pain in right shoulder: Secondary | ICD-10-CM | POA: Insufficient documentation

## 2023-10-20 DIAGNOSIS — M546 Pain in thoracic spine: Secondary | ICD-10-CM | POA: Diagnosis present

## 2023-10-20 DIAGNOSIS — M62830 Muscle spasm of back: Secondary | ICD-10-CM | POA: Insufficient documentation

## 2023-10-20 MED ORDER — DICLOFENAC SODIUM 75 MG PO TBEC
75.0000 mg | DELAYED_RELEASE_TABLET | Freq: Once | ORAL | Status: AC
  Administered 2023-10-20: 75 mg via ORAL
  Filled 2023-10-20: qty 1

## 2023-10-20 MED ORDER — LIDOCAINE 5 % EX PTCH
1.0000 | MEDICATED_PATCH | Freq: Once | CUTANEOUS | Status: DC
  Administered 2023-10-20: 1 via TRANSDERMAL
  Filled 2023-10-20: qty 1

## 2023-10-20 MED ORDER — LIDOCAINE 5 % EX PTCH: 1.0000 | MEDICATED_PATCH | Freq: Two times a day (BID) | CUTANEOUS | 0 refills | Status: AC | PRN

## 2023-10-20 MED ORDER — DICLOFENAC SODIUM 75 MG PO TBEC: 75.0000 mg | DELAYED_RELEASE_TABLET | Freq: Two times a day (BID) | ORAL | 0 refills | Status: AC

## 2023-10-20 NOTE — ED Provider Notes (Signed)
[facility] Emergency Department Provider Note     Event Date/Time   First MD Initiated Contact with Patient 10/20/23 1833     (approximate)   History   Shoulder Pain   HPI  Monica Gonzalez is a 47 y.o. female with a history of lupus, fibromyalgia, cervical DDD, and Raynaud's, presents to the ED for evaluation of 2 days of scapulothoracic shoulder pain.  She will report nontraumatic onset when she apparently was lying in bed and range the shoulder where she felt a pop.  She localizes pain to the posterior shoulder region denied any rotator cuff deficit, grip changes, distal paresthesias.  She denies any history of chronic or ongoing shoulder arthropathies.  Physical Exam   Triage Vital Signs: ED Triage Vitals  Encounter Vitals Group     BP 10/20/23 1651 131/77     Systolic BP Percentile --      Diastolic BP Percentile --      Pulse Rate 10/20/23 1651 68     Resp 10/20/23 1651 16     Temp 10/20/23 1651 98.1 F (36.7 C)     Temp Source 10/20/23 1651 Oral     SpO2 10/20/23 1651 98 %     Weight 10/20/23 1651 180 lb (81.6 kg)     Height 10/20/23 1651 4\' 11"  (1.499 m)     Head Circumference --      Peak Flow --      Pain Score 10/20/23 1654 6     Pain Loc --      Pain Education --      Exclude from Growth Chart --     Most recent vital signs: Vitals:   10/20/23 1651 10/20/23 1653  BP: 131/77 131/77  Pulse: 68 74  Resp: 16 20  Temp: 98.1 F (36.7 C) 98.1 F (36.7 C)  SpO2: 98% 100%    General Awake, no distress. NAD HEENT NCAT. PERRL. EOMI. No rhinorrhea. Mucous membranes are moist.  CV:  Good peripheral perfusion.  RESP:  Normal effort.  ABD:  No distention.  MSK:  Right shoulder without obvious deformity, dislocation, or sulcus sign.  Full active range of motion on exam.  No rotator cuff deficit appreciated.  No evidence of internal derangement noted.  Patient localized tenderness to the posterior scapulothoracic muscle region.  No  winging scapula forward dyskinetic movement noted.  Normal composite fist distally. NEURO: Cranial nerves II to XII grossly intact.   ED Results / Procedures / Treatments   Labs (all labs ordered are listed, but only abnormal results are displayed) Labs Reviewed - No data to display   EKG   RADIOLOGY  I personally viewed and evaluated these images as part of my medical decision making, as well as reviewing the written report by the radiologist.  ED Provider Interpretation: No acute findings  DG Shoulder Right  Result Date: 10/20/2023 CLINICAL DATA:  Posterior right shoulder pain following popping sensation 3 days ago, initial encounter EXAM: RIGHT SHOULDER - 2+ VIEW COMPARISON:  07/23/2022 FINDINGS: Mild degenerative changes of the acromioclavicular joint are seen. No acute fracture or dislocation is noted. No soft tissue abnormality is seen. IMPRESSION: No acute abnormality noted. Electronically Signed   By: Alcide Clever M.D.   On: 10/20/2023 18:57     PROCEDURES:  Critical Care performed: No  Procedures   MEDICATIONS ORDERED IN ED: Medications  lidocaine (LIDODERM) 5 % 1 patch (has no administration in time range)  diclofenac (VOLTAREN) EC  tablet 75 mg (has no administration in time range)     IMPRESSION / MDM / ASSESSMENT AND PLAN / ED COURSE  I reviewed the triage vital signs and the nursing notes.                              Differential diagnosis includes, but is not limited to, shoulder strain, rotator cuff tendinitis, muscle strain, tendinitis  Patient's presentation is most consistent with acute complicated illness / injury requiring diagnostic workup.  Patient's diagnosis is consistent with muscle strain of the scapulothoracic region.  No evidence of rotator cuff deficit or shoulder arthropathy on exam.  No radiologic evidence of any acute fracture or dislocation based on my interpretation.  Patient will be discharged home with prescriptions for Lidoderm  patches and diclofenac to take and lieu of her previously prescribed ibuprofen and Celebrex. Patient is to follow up with her PCP as discussed, as needed or otherwise directed. Patient is given ED precautions to return to the ED for any worsening or new symptoms.   FINAL CLINICAL IMPRESSION(S) / ED DIAGNOSES   Final diagnoses:  Spasm of thoracic back muscle     Rx / DC Orders   ED Discharge Orders          Ordered    diclofenac (VOLTAREN) 75 MG EC tablet  2 times daily        10/20/23 1858    lidocaine (LIDODERM) 5 %  Every 12 hours PRN        10/20/23 1858             Note:  This document was prepared using Dragon voice recognition software and may include unintentional dictation errors.    Lissa Hoard, PA-C 10/20/23 1905    Concha Se, MD 10/20/23 2028

## 2023-10-20 NOTE — ED Triage Notes (Signed)
Pt to ED via POV from home. Pt reports 2 days ago was laying in bed and felt right shoulder pop. Pt reports right shoulder pain since. Pt states pain 6/10.

## 2023-10-20 NOTE — Discharge Instructions (Addendum)
Your exam and x-ray are normal between the time.  No signs of a serious shoulder fracture or dislocation.  Your symptoms are consistent with a shoulder muscle strain.  Take the diclofenac in place of your prescription ibuprofen and Celebrex.  Continue with your prescription muscle relaxant he is lidocaine patches as directed.  Apply moist heat to help reduce symptoms.  Follow with primary provider for ongoing concerns.

## 2023-11-15 DIAGNOSIS — C50411 Malignant neoplasm of upper-outer quadrant of right female breast: Secondary | ICD-10-CM

## 2023-11-15 HISTORY — DX: Malignant neoplasm of upper-outer quadrant of right female breast: C50.411

## 2024-02-19 ENCOUNTER — Emergency Department
Admission: EM | Admit: 2024-02-19 | Discharge: 2024-02-19 | Disposition: A | Discharge status: Home / Self Care | Attending: Emergency Medicine | Admitting: Emergency Medicine

## 2024-02-19 DIAGNOSIS — R42 Dizziness and giddiness: Secondary | ICD-10-CM | POA: Insufficient documentation

## 2024-02-19 LAB — COMPREHENSIVE METABOLIC PANEL WITH GFR
ALT: 24 U/L (ref 0–44)
AST: 28 U/L (ref 15–41)
Albumin: 3.2 g/dL — ABNORMAL LOW (ref 3.5–5.0)
Alkaline Phosphatase: 53 U/L (ref 38–126)
Anion gap: 7 (ref 5–15)
BUN: 7 mg/dL (ref 6–20)
CO2: 23 mmol/L (ref 22–32)
Calcium: 8.7 mg/dL — ABNORMAL LOW (ref 8.9–10.3)
Chloride: 111 mmol/L (ref 98–111)
Creatinine, Ser: 0.8 mg/dL (ref 0.44–1.00)
GFR, Estimated: >60 mL/min (ref >60)
Glucose, Bld: 102 mg/dL — ABNORMAL HIGH (ref 70–99)
Potassium: 3.8 mmol/L (ref 3.5–5.1)
Sodium: 141 mmol/L (ref 135–145)
Total Bilirubin: 0.9 mg/dL (ref 0.0–1.2)
Total Protein: 6.2 g/dL — ABNORMAL LOW (ref 6.5–8.1)

## 2024-02-19 LAB — CBC
HCT: 41.8 % (ref 36.0–46.0)
Hemoglobin: 13.8 g/dL (ref 12.0–15.0)
MCH: 29.6 pg (ref 26.0–34.0)
MCHC: 33 g/dL (ref 30.0–36.0)
MCV: 89.7 fL (ref 80.0–100.0)
Platelets: 269 10*3/uL (ref 150–400)
RBC: 4.66 MIL/uL (ref 3.87–5.11)
RDW: 13.2 % (ref 11.5–15.5)
WBC: 6.3 10*3/uL (ref 4.0–10.5)
nRBC: 0 % (ref 0.0–0.2)

## 2024-02-19 NOTE — Discharge Instructions (Signed)
 As we discussed please discontinue your blood pressure medication/calcium channel blocker.  Please follow-up with your PCP or rheumatologist regarding the medication change.  Drink plenty of fluids.  Return to the emergency department for any symptom personally concerning to yourself otherwise follow-up with your doctor in the next several days for recheck.

## 2024-02-19 NOTE — ED Provider Notes (Signed)
 [facility] Provider Note    Event Date/Time   First MD Initiated Contact with Patient 02/19/24 1515     (approximate)  History   Chief Complaint: Dizziness  HPI  Monica Gonzalez is a 48 y.o. female with a past medical history of anemia, gastric reflux, lupus who presents to the emergency department for 2 days of dizziness/low blood pressure.  According to the patient for the past 2 days she has been feeling intermittently dizzy especially when she first stands up.  Patient states she has checked her blood pressure multiple times of last couple days and is usually between 95 and 105.  Patient states her blood pressure typically runs closer to 120.  Patient states she is on felodipine for her lupus but I do not see any other blood pressure medications listed.  Patient denies any chest pain or shortness of breath.  No recent illnesses fever cough congestion nausea vomiting or diarrhea.  Physical Exam   Triage Vital Signs: ED Triage Vitals [02/19/24 1211]  Encounter Vitals Group     BP 118/84     Systolic BP Percentile      Diastolic BP Percentile      Pulse Rate 64     Resp 18     Temp 98.6 F (37 C)     Temp Source Oral     SpO2 97 %     Weight 180 lb (81.6 kg)     Height 4\' 11"  (1.499 m)     Head Circumference      Peak Flow      Pain Score 0     Pain Loc      Pain Education      Exclude from Growth Chart     Most recent vital signs: Vitals:   02/19/24 1211  BP: 118/84  Pulse: 64  Resp: 18  Temp: 98.6 F (37 C)  SpO2: 97%    General: Awake, no distress.  CV:  Good peripheral perfusion.  Regular rate and rhythm  Resp:  Normal effort.  Equal breath sounds bilaterally.  Abd:  No distention.  Soft, nontender.  No rebound or guarding.  ED Results / Procedures / Treatments   EKG  EKG viewed and interpreted by myself shows a normal sinus rhythm at 69 bpm with a narrow QRS, normal axis, normal intervals, no concerning ST  changes.  MEDICATIONS ORDERED IN ED: Medications - No data to display   IMPRESSION / MDM / ASSESSMENT AND PLAN / ED COURSE  I reviewed the triage vital signs and the nursing notes.  Patient's presentation is most consistent with acute presentation with potential threat to life or bodily function.  Patient presents emergency department for intermittent dizziness and blood pressures around 100 systolic at home.  Overall patient appears well she states she did not take her blood pressure medication this morning and her blood pressure is currently 118/84, pulse rate of 64 reassuring vital signs, reassuring physical exam.  CBC is normal and chemistry shows no concerning finding with normal renal function.  I discussed with the patient increase her fluids at home to discontinue her felodipine and to discuss this with her doctor.  Patient denies any other concerning findings on physical exam or history.  Will have the patient follow-up with her doctor.  Patient agreeable to plan of care.  FINAL CLINICAL IMPRESSION(S) / ED DIAGNOSES  Lightheaded Note:  This document was prepared using Dragon voice recognition software and may include unintentional  dictation errors.   Minna Antis, MD 02/19/24 [phone]

## 2024-02-19 NOTE — ED Triage Notes (Signed)
 Pt presents to the ED POV from home. Pt reports hypotension at home. States that when sitting up her BP was 103 systolic, but when lying down it was 88 systolic. Pt states that she felt lightheaded on Saturday. Reports dizziness when lying down. Sitting BP at time of triage 118/84. HR 64.

## 2024-05-10 NOTE — Progress Notes (Signed)
 Chief Complaint  Patient presents with  . Shoulder Pain  . Knee Pain  . Back Pain    Patient is agreeable to [employer] scribe.   History of Present Illness Monica Gonzalez is a 48 year old female with lupus and degenerative disc disease who presents with worsening joint pain and dizziness.  She experiences significant joint pain, particularly in her ankles, knees, and neck. The pain is severe, with a constant level of 7 out of 10, and worsens after working, making it difficult to get out of bed the following day. Her usual medications, gabapentin and Celebrex, are no longer effective, and other treatments like heating pads and hot baths have also stopped providing relief. She describes a sensation of having no cartilage in her joints and reports that touching her joints causes pain.  She experiences dizziness, particularly when bending down or standing for extended periods, leading to episodes where she feels off balance and needs to catch herself to avoid falling. She has noted low blood pressure readings and was recently taken off nifedipine due to this issue. Despite this change, she continues to experience low blood pressure and associated symptoms.  She reports a new issue with a hard mass at the rectum, which she initially thought was a hemorrhoid. The mass has been present for about a month and seems to be enlarging. She has a history of internal hemorrhoids found during a colonoscopy last year. She describes the mass as hard rather than mushy and notes discomfort when sitting or taking a bath.  She has a history of bowel issues, including a hiatal hernia and diverticulitis, and experiences fluctuating bowel movements. She reports episodes of diarrhea that occur shortly after eating, requiring her to stay close to a bathroom.  She has been experiencing symptoms of Raynaud's syndrome since childhood, with episodes of her extremities turning white in cold conditions and difficulty warming up.  She also reports being anemic and has noticed increased fatigue and weakness recently.  Current medications include gabapentin and Celexa 20 mg for anxiety, depression, and PTSD.    ROS  Review of systems is unremarkable for any active cardiac, respiratory, GI, GU, hematologic, neurologic, dermatologic, HEENT, or psychiatric symptoms except as noted above.  No fevers, chills, or constitutional symptoms.   Current Outpatient Medications  Medication Sig Dispense Refill  . albuterol  (PROVENTIL ) 2.5 mg /3 mL (0.083 %) nebulizer solution Take 3 mLs (2.5 mg total) by nebulization every 6 (six) hours as needed for Wheezing 75 mL 12  . albuterol  90 mcg/actuation inhaler Inhale 2 inhalations into the lungs every 6 (six) hours as needed for Wheezing 1 each 1  . busPIRone (BUSPAR) 5 MG tablet Take 1 tablet (5 mg total) by mouth 2 (two) times daily 60 tablet 11  . calcium carbonate (TUMS ORAL) Take 2 each by mouth every 4 (four) hours as needed    . celecoxib (CELEBREX) 200 MG capsule Take 1 capsule (200 mg total) by mouth 2 (two) times daily 60 capsule 3  . clonazePAM (KLONOPIN) 0.5 MG tablet Take 1 tablet (0.5 mg total) by mouth at bedtime as needed 20 tablet 2  . cyclobenzaprine  (FLEXERIL ) 5 MG tablet Take 1 tablet by mouth three times daily as needed 30 tablet 0  . gabapentin (NEURONTIN) 300 MG capsule Take 2 capsules (600 mg total) by mouth 2 (two) times daily 360 capsule 3  . ibuprofen  (MOTRIN ) 800 MG tablet Take 1 tablet (800 mg total) by mouth every 8 (eight) hours as  needed for up to 30 days for Pain 60 tablet 0  . ipratropium-albuteroL  (DUO-NEB) nebulizer solution Inhale 3 mLs into the lungs    . mag hydrox/aluminum hyd/simeth (MAALOX MAXIMUM STRENGTH ORAL) Take 30 mLs by mouth every 3 (three) hours as needed    . polyethylene glycol (MIRALAX ) powder Take by mouth    . promethazine -dextromethorphan (PROMETHAZINE -DM) 6.25-15 mg/5 mL syrup Take 5 mLs by mouth every 6 (six) hours as needed for  Cough 118 mL 1  . SUMAtriptan (IMITREX) 50 MG tablet Take 1 tablet (50 mg total) by mouth as directed for Migraine May take a second dose after 2 hours if needed. 9 tablet 0  . tranexamic acid (LYSTEDA) 650 mg tablet Take 2 tablets (1,300 mg total) by mouth 3 (three) times daily Take for a maximum of 5 days during monthly menstruation. 30 tablet 3  . traZODone (DESYREL) 50 MG tablet Take 1 tablet (50 mg total) by mouth at bedtime 30 tablet 11  . DULoxetine (CYMBALTA) 30 MG DR capsule Take 1 capsule (30 mg total) by mouth once daily 30 capsule 11  . fluticasone-umeclidinium-vilanterol (TRELEGY ELLIPTA) 200-62.5-25 mcg inhaler Inhale 1 Puff into the lungs once daily (Patient not taking: Reported on 05/10/2024) 60 each 1  . linaCLOtide (LINZESS) 145 mcg capsule Take 1 capsule (145 mcg total) by mouth once daily Take 30 mins before food (Patient not taking: Reported on 05/10/2024) 30 capsule 3  . sulfamethoxazole-trimethoprim (BACTRIM DS) 800-160 mg tablet Take 1 tablet (160 mg of trimethoprim total) by mouth every 12 (twelve) hours for 7 days 14 tablet 0   No current facility-administered medications for this visit.    Allergies as of 05/10/2024 - Reviewed 05/10/2024  Allergen Reaction Noted  . Prednisone Swelling and Anaphylaxis 05/03/2015    Patient Active Problem List  Diagnosis  . Asthma (HHS-HCC)  . Other forms of systemic lupus erythematosus (CMS/HHS-HCC)  . Hiatal hernia  . Family history of premature CAD  . Atypical chest pain  . Palpitations  . Cellulitis of right lower extremity  . Colles' fracture  . Fibromyalgia    Past Medical History:  Diagnosis Date  . Acute stomach ulcer   . Anemia   . Anxiety   . Asthma (HHS-HCC)   . Bronchitis, chronic (CMS/HHS-HCC)   . GERD (gastroesophageal reflux disease)   . Heart murmur   . Hiatal hernia   . History of cancer   . History of pneumonia   . Kidney stones   . Lupus   . Vulvar intraepithelial neoplasia (VIN) grade 3      Past Surgical History:  Procedure Laterality Date  . Colon @ [facility]  02/17/2023   Colonoscopy was normal, repeat in 10 years/CTL  . EGD @ [facility]  02/17/2023   Gastritis/esophagitis/Repeat PRN/CTL  . 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    . TONSILLECTOMY    . TUBAL LIGATION      Vitals:   05/10/24 1432  BP: 114/68  Pulse: 64  SpO2: 98%  Weight: 89.3 kg (196 lb 12.8 oz)  PainSc:   6  PainLoc: Back   Body mass index is 39.75 kg/m.  Exam BP 114/68   Pulse 64   Wt 89.3 kg (196 lb 12.8 oz)   SpO2 98%   BMI 39.75 kg/m   General. Well appearing; NAD; VS reviewed     Eyes. Sclera and conjunctiva clear; Vision grossly intact; extraocular movements intact Oropharynx. No suspicious lesions Neck. Supple. No  swelling, masses, thyroid  normal size, no masses palpated.   Lungs. Respirations unlabored; clear to auscultation bilaterally Cardiovascular. Heart regular rate and rhythm without murmurs, gallops, or rubs Rectal: External rectal exam reveal in the 12 o'clock position of the anus 1 cm x 1-1/2 cm firm/slightly fluctuant Skin. Normal color and turgor Neurologic. Alert and oriented x3; CN 2-12 grossly intact; no focal deficits  Assessment & Plan  Joint Pain Worsening joint pain in ankles, knees, and neck, possibly related to lupus flare-up or fibromyalgia. Current treatment ineffective. Cymbalta proposed for fibromyalgia, chronic pain, anxiety, depression, and PTSD.  No rash present today. - Switch from Celexa to Cymbalta 30 mg. - Continue gabapentin. - Check labs: blood counts, kidney, liver, thyroid  function.  Dizziness and Weakness Dizziness, weakness, and hypotension possibly related to medication changes or underlying condition. Cardiac issues considered due to family history.  Follow-up with cardiology. - Refer to cardiology for Holter monitor evaluation. - Check labs for underlying causes.  Anal nodule Hard anal nodule/mass with pus, possibly  a fistula or abscess. Requires surgical evaluation. - Prescribe Bactrim twice a day for 7 days. - Refer to surgeon for evaluation. - Advise warm soapy water baths and warm compresses.  Raynaud's Phenomenon Raynaud's phenomenon with color changes and difficulty warming up. May be flaring up.  Not on felodipine at this time due to episodes of dizziness. - Consider further evaluation if symptoms worsens.    F/U: Patient to follow-up with general surgeon and cardiology.  Give us  an update in a few weeks on Cymbalta.  Labs pending.  JASON HESTLE WHITAKER, PA  This note has been created using automated tools and reviewed for accuracy by JASON HESTLE WHITAKER.   Note: This dictation was prepared with Dragon dictation along with smaller phrase technology. Any transcriptional errors that result from this process are unintentional.

## 2024-05-15 ENCOUNTER — Ambulatory Visit: Payer: Self-pay | Admitting: Surgery

## 2024-05-15 MED ORDER — ACETAMINOPHEN 500 MG PO TABS
1000.0000 mg | ORAL_TABLET | ORAL | Status: AC
  Administered 2024-05-16: 1000 mg via ORAL

## 2024-05-15 MED ORDER — CEFAZOLIN SODIUM-DEXTROSE 2-4 GM/100ML-% IV SOLN
2.0000 g | INTRAVENOUS | Status: AC
  Administered 2024-05-16: 2 g via INTRAVENOUS

## 2024-05-15 MED ORDER — CHLORHEXIDINE GLUCONATE CLOTH 2 % EX PADS: 6.0000 | MEDICATED_PAD | Freq: Once | CUTANEOUS | Status: DC

## 2024-05-15 NOTE — H&P (Signed)
 Subjective:  CC: Perianal abscess [K61.0]   HPI:  referrred by Monica Gonzalez, * for above.   History of Present Illness  Lump present for few days now, worsening discharge and pain and swelling.   Past Medical History:  has a past medical history of Acute stomach ulcer, Anemia, Anxiety, Asthma (HHS-HCC), Bronchitis, chronic (CMS/HHS-HCC), GERD (gastroesophageal reflux disease), Heart murmur, Hiatal hernia, History of cancer, History of pneumonia, Kidney stones, Lupus, and Vulvar intraepithelial neoplasia (VIN) grade 3.  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  Family History: family history includes Asthma in her mother; Atrial fibrillation (Abnormal heart rhythm sometimes requiring treatment with blood thinners) in her maternal grandmother; Autoimmune disease in her maternal grandmother; COPD in her mother; Cancer in her paternal grandfather; Clotting disorder in her maternal grandmother; Coronary Artery Disease (Blocked arteries around heart) in her father and mother; Diabetes type II in her mother; Heart failure in her maternal grandmother; High blood pressure (Hypertension) in her maternal grandmother; Kidney failure in her mother; Lupus in her maternal grandmother; Myocardial Infarction (Heart attack) in her mother; No Known Problems in her half-brother and half-sister; Prostate cancer in her maternal grandfather.  Social History:  reports that she quit smoking about 22 years ago. Her smoking use included cigarettes. She has never used smokeless tobacco. She reports current alcohol use. She reports that she does not use drugs.  Current Medications: has a current medication list which includes the following prescription(s): buspirone, calcium carbonate, celecoxib, clonazepam, cyclobenzaprine , duloxetine, trelegy  ellipta, ibuprofen , ipratropium-albuterol , linaclotide, mag hydrox/aluminum hyd/simeth, polyethylene glycol, promethazine -dextromethorphan, sulfamethoxazole-trimethoprim, sumatriptan, tranexamic acid, trazodone, albuterol , albuterol  mdi (proventil , ventolin , proair ) hfa, and gabapentin.  Allergies:  Allergies as of 05/15/2024 - Reviewed 05/15/2024  Allergen Reaction Noted   Prednisone Swelling and Anaphylaxis 05/03/2015    ROS:  A 15 point review of systems was performed and pertinent positives and negatives noted in HPI  Objective:     BP 119/83   Pulse 66   Ht 149.9 cm (4' 11)   Wt 88.5 kg (195 lb)   BMI 39.39 kg/m   Constitutional :  No distress, cooperative, alert  Lymphatics/Throat:  Supple with no lymphadenopathy  Respiratory:  Clear to auscultation bilaterally  Cardiovascular:  Regular rate and rhythm  Gastrointestinal: Soft, non-tender, non-distended, no organomegaly.  Musculoskeletal: Steady gait and movement  Skin: Cool and moist  Psychiatric: Normal affect, non-agitated, not confused  Rectal: Firm nodular like area on right lateral aspect with pinpoint purulent drainage, likely abscess, but firm.      LABS:  N/a   RADS: N/a  Assessment:     Perianal abscess [K61.0]  Plan:    1. Perianal abscess [K61.0] Discussed risks/benefits/alternatives to surgery.  Alternatives include the options of observation, medical management.  Benefits include symptomatic relief.  I discussed  in detail and the complications related to the operation and the anesthesia, including bleeding, infection, recurrence, rpoor/delayed wound healing, chronic pain, and additional procedures to address said risks. The risks of general anesthetic, if used, includes MI, CVA, sudden death or even reaction to anesthetic medications also discussed.    We also discussed typical post operative recovery which includes weeks to potentially months of anal pain, drainage, occasional bleeding, and sense of  fecal urgency.    ED return precautions given for sudden increase in pain, bleeding, with possible accompanying  fever, nausea, and/or vomiting.  The patient understands the risks, any and all questions were answered to the patient's satisfaction.  2. Patient has elected to proceed with surgical treatment. Procedure will be scheduled. EUA, possible seton placement.  Left lateral positioning.  labs/images/medications/previous chart entries reviewed personally and relevant changes/updates noted above.

## 2024-05-15 NOTE — H&P (View-Only) (Signed)
 Subjective:  CC: Perianal abscess [K61.0]   HPI:  referrred by Monica Gonzalez, * for above.   History of Present Illness  Lump present for few days now, worsening discharge and pain and swelling.   Past Medical History:  has a past medical history of Acute stomach ulcer, Anemia, Anxiety, Asthma (HHS-HCC), Bronchitis, chronic (CMS/HHS-HCC), GERD (gastroesophageal reflux disease), Heart murmur, Hiatal hernia, History of cancer, History of pneumonia, Kidney stones, Lupus, and Vulvar intraepithelial neoplasia (VIN) grade 3.  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  Family History: family history includes Asthma in her mother; Atrial fibrillation (Abnormal heart rhythm sometimes requiring treatment with blood thinners) in her maternal grandmother; Autoimmune disease in her maternal grandmother; COPD in her mother; Cancer in her paternal grandfather; Clotting disorder in her maternal grandmother; Coronary Artery Disease (Blocked arteries around heart) in her father and mother; Diabetes type II in her mother; Heart failure in her maternal grandmother; High blood pressure (Hypertension) in her maternal grandmother; Kidney failure in her mother; Lupus in her maternal grandmother; Myocardial Infarction (Heart attack) in her mother; No Known Problems in her half-brother and half-sister; Prostate cancer in her maternal grandfather.  Social History:  reports that she quit smoking about 22 years ago. Her smoking use included cigarettes. She has never used smokeless tobacco. She reports current alcohol use. She reports that she does not use drugs.  Current Medications: has a current medication list which includes the following prescription(s): buspirone, calcium carbonate, celecoxib, clonazepam, cyclobenzaprine , duloxetine, trelegy  ellipta, ibuprofen , ipratropium-albuterol , linaclotide, mag hydrox/aluminum hyd/simeth, polyethylene glycol, promethazine -dextromethorphan, sulfamethoxazole-trimethoprim, sumatriptan, tranexamic acid, trazodone, albuterol , albuterol  mdi (proventil , ventolin , proair ) hfa, and gabapentin.  Allergies:  Allergies as of 05/15/2024 - Reviewed 05/15/2024  Allergen Reaction Noted   Prednisone Swelling and Anaphylaxis 05/03/2015    ROS:  A 15 point review of systems was performed and pertinent positives and negatives noted in HPI  Objective:     BP 119/83   Pulse 66   Ht 149.9 cm (4' 11)   Wt 88.5 kg (195 lb)   BMI 39.39 kg/m   Constitutional :  No distress, cooperative, alert  Lymphatics/Throat:  Supple with no lymphadenopathy  Respiratory:  Clear to auscultation bilaterally  Cardiovascular:  Regular rate and rhythm  Gastrointestinal: Soft, non-tender, non-distended, no organomegaly.  Musculoskeletal: Steady gait and movement  Skin: Cool and moist  Psychiatric: Normal affect, non-agitated, not confused  Rectal: Firm nodular like area on right lateral aspect with pinpoint purulent drainage, likely abscess, but firm.      LABS:  N/a   RADS: N/a  Assessment:     Perianal abscess [K61.0]  Plan:    1. Perianal abscess [K61.0] Discussed risks/benefits/alternatives to surgery.  Alternatives include the options of observation, medical management.  Benefits include symptomatic relief.  I discussed  in detail and the complications related to the operation and the anesthesia, including bleeding, infection, recurrence, rpoor/delayed wound healing, chronic pain, and additional procedures to address said risks. The risks of general anesthetic, if used, includes MI, CVA, sudden death or even reaction to anesthetic medications also discussed.    We also discussed typical post operative recovery which includes weeks to potentially months of anal pain, drainage, occasional bleeding, and sense of  fecal urgency.    ED return precautions given for sudden increase in pain, bleeding, with possible accompanying  fever, nausea, and/or vomiting.  The patient understands the risks, any and all questions were answered to the patient's satisfaction.  2. Patient has elected to proceed with surgical treatment. Procedure will be scheduled. EUA, possible seton placement.  Left lateral positioning.  labs/images/medications/previous chart entries reviewed personally and relevant changes/updates noted above.

## 2024-05-16 ENCOUNTER — Encounter: Payer: Self-pay | Admitting: Surgery

## 2024-05-16 ENCOUNTER — Ambulatory Visit
Admission: RE | Admit: 2024-05-16 | Discharge: 2024-05-16 | Disposition: A | Discharge status: Home / Self Care | Attending: Surgery | Admitting: Surgery

## 2024-05-16 ENCOUNTER — Encounter
Admission: RE | Disposition: A | Discharge status: Home / Self Care | Payer: Self-pay | Source: Home / Self Care | Attending: Surgery

## 2024-05-16 ENCOUNTER — Other Ambulatory Visit: Payer: Self-pay

## 2024-05-16 ENCOUNTER — Ambulatory Visit: Admitting: Certified Registered"

## 2024-05-16 DIAGNOSIS — R200 Anesthesia of skin: Secondary | ICD-10-CM | POA: Insufficient documentation

## 2024-05-16 DIAGNOSIS — E669 Obesity, unspecified: Secondary | ICD-10-CM | POA: Insufficient documentation

## 2024-05-16 DIAGNOSIS — Z7951 Long term (current) use of inhaled steroids: Secondary | ICD-10-CM | POA: Diagnosis not present

## 2024-05-16 DIAGNOSIS — K449 Diaphragmatic hernia without obstruction or gangrene: Secondary | ICD-10-CM | POA: Insufficient documentation

## 2024-05-16 DIAGNOSIS — K219 Gastro-esophageal reflux disease without esophagitis: Secondary | ICD-10-CM | POA: Insufficient documentation

## 2024-05-16 DIAGNOSIS — D649 Anemia, unspecified: Secondary | ICD-10-CM | POA: Diagnosis not present

## 2024-05-16 DIAGNOSIS — J45909 Unspecified asthma, uncomplicated: Secondary | ICD-10-CM | POA: Insufficient documentation

## 2024-05-16 DIAGNOSIS — Z79899 Other long term (current) drug therapy: Secondary | ICD-10-CM | POA: Insufficient documentation

## 2024-05-16 DIAGNOSIS — M797 Fibromyalgia: Secondary | ICD-10-CM | POA: Diagnosis not present

## 2024-05-16 DIAGNOSIS — F419 Anxiety disorder, unspecified: Secondary | ICD-10-CM | POA: Diagnosis not present

## 2024-05-16 DIAGNOSIS — Z01818 Encounter for other preprocedural examination: Secondary | ICD-10-CM

## 2024-05-16 DIAGNOSIS — Z87891 Personal history of nicotine dependence: Secondary | ICD-10-CM | POA: Insufficient documentation

## 2024-05-16 DIAGNOSIS — Z6838 Body mass index (BMI) 38.0-38.9, adult: Secondary | ICD-10-CM | POA: Insufficient documentation

## 2024-05-16 DIAGNOSIS — C210 Malignant neoplasm of anus, unspecified: Secondary | ICD-10-CM | POA: Diagnosis present

## 2024-05-16 HISTORY — PX: RECTAL EXAM UNDER ANESTHESIA: SHX6399

## 2024-05-16 LAB — POCT PREGNANCY, URINE: Preg Test, Ur: NEGATIVE

## 2024-05-16 SURGERY — EXAM UNDER ANESTHESIA, RECTUM
Anesthesia: General | Site: Anus

## 2024-05-16 MED ORDER — FENTANYL CITRATE (PF) 100 MCG/2ML IJ SOLN
INTRAMUSCULAR | Status: AC
  Filled 2024-05-16: qty 2

## 2024-05-16 MED ORDER — OXYCODONE HCL 5 MG PO TABS
5.0000 mg | ORAL_TABLET | Freq: Once | ORAL | Status: AC | PRN
  Administered 2024-05-16: 5 mg via ORAL

## 2024-05-16 MED ORDER — SUCCINYLCHOLINE CHLORIDE 200 MG/10ML IV SOSY
PREFILLED_SYRINGE | INTRAVENOUS | Status: DC | PRN
  Administered 2024-05-16 (×2): 100 mg via INTRAVENOUS

## 2024-05-16 MED ORDER — ACETAMINOPHEN 500 MG PO TABS
ORAL_TABLET | ORAL | Status: AC
  Filled 2024-05-16: qty 2

## 2024-05-16 MED ORDER — ACETAMINOPHEN 10 MG/ML IV SOLN: 1000.0000 mg | Freq: Once | INTRAVENOUS | Status: DC | PRN

## 2024-05-16 MED ORDER — TRAMADOL HCL 50 MG PO TABS: 50.0000 mg | ORAL_TABLET | Freq: Three times a day (TID) | ORAL | 0 refills | Status: DC | PRN

## 2024-05-16 MED ORDER — LACTATED RINGERS IV SOLN: INTRAVENOUS | Status: DC

## 2024-05-16 MED ORDER — CEFAZOLIN SODIUM-DEXTROSE 2-4 GM/100ML-% IV SOLN
INTRAVENOUS | Status: AC
  Filled 2024-05-16: qty 100

## 2024-05-16 MED ORDER — OXYCODONE HCL 5 MG PO TABS
ORAL_TABLET | ORAL | Status: AC
  Filled 2024-05-16: qty 1

## 2024-05-16 MED ORDER — FENTANYL CITRATE (PF) 100 MCG/2ML IJ SOLN: 25.0000 ug | INTRAMUSCULAR | Status: DC | PRN

## 2024-05-16 MED ORDER — DEXAMETHASONE SODIUM PHOSPHATE 10 MG/ML IJ SOLN
INTRAMUSCULAR | Status: DC | PRN
  Administered 2024-05-16: 10 mg via INTRAVENOUS

## 2024-05-16 MED ORDER — HEMOSTATIC AGENTS (NO CHARGE) OPTIME
TOPICAL | Status: DC | PRN
  Administered 2024-05-16: 1 via TOPICAL

## 2024-05-16 MED ORDER — LIDOCAINE HCL (CARDIAC) PF 100 MG/5ML IV SOSY
PREFILLED_SYRINGE | INTRAVENOUS | Status: DC | PRN
  Administered 2024-05-16: 100 mg via INTRAVENOUS

## 2024-05-16 MED ORDER — CHLORHEXIDINE GLUCONATE 0.12 % MT SOLN
15.0000 mL | Freq: Once | OROMUCOSAL | Status: AC
  Administered 2024-05-16: 15 mL via OROMUCOSAL

## 2024-05-16 MED ORDER — PROPOFOL 10 MG/ML IV BOLUS
INTRAVENOUS | Status: DC | PRN
  Administered 2024-05-16: 50 mg via INTRAVENOUS
  Administered 2024-05-16: 200 mg via INTRAVENOUS

## 2024-05-16 MED ORDER — MIDAZOLAM HCL 2 MG/2ML IJ SOLN
INTRAMUSCULAR | Status: AC
  Filled 2024-05-16: qty 2

## 2024-05-16 MED ORDER — OXYCODONE HCL 5 MG/5ML PO SOLN: 5.0000 mg | Freq: Once | ORAL | Status: AC | PRN

## 2024-05-16 MED ORDER — ONDANSETRON HCL 4 MG/2ML IJ SOLN
INTRAMUSCULAR | Status: DC | PRN
  Administered 2024-05-16: 4 mg via INTRAVENOUS

## 2024-05-16 MED ORDER — BUPIVACAINE-EPINEPHRINE (PF) 0.5% -1:200000 IJ SOLN
INTRAMUSCULAR | Status: DC | PRN
Start: 2024-05-16 — End: 2024-05-16
  Administered 2024-05-16: 10 mL via PERINEURAL

## 2024-05-16 MED ORDER — ALBUTEROL SULFATE HFA 108 (90 BASE) MCG/ACT IN AERS
INHALATION_SPRAY | RESPIRATORY_TRACT | Status: DC | PRN
  Administered 2024-05-16: 4 via RESPIRATORY_TRACT

## 2024-05-16 MED ORDER — ACETAMINOPHEN 325 MG PO TABS: 650.0000 mg | ORAL_TABLET | Freq: Three times a day (TID) | ORAL | 0 refills | Status: AC | PRN

## 2024-05-16 MED ORDER — CHLORHEXIDINE GLUCONATE 0.12 % MT SOLN
OROMUCOSAL | Status: AC
  Filled 2024-05-16: qty 15

## 2024-05-16 MED ORDER — FENTANYL CITRATE (PF) 100 MCG/2ML IJ SOLN
INTRAMUSCULAR | Status: DC | PRN
  Administered 2024-05-16: 100 ug via INTRAVENOUS

## 2024-05-16 MED ORDER — BUPIVACAINE LIPOSOME 1.3 % IJ SUSP
INTRAMUSCULAR | Status: AC
  Filled 2024-05-16: qty 20

## 2024-05-16 MED ORDER — PROPOFOL 10 MG/ML IV BOLUS
INTRAVENOUS | Status: AC
Start: 2024-05-16 — End: 2024-05-16
  Filled 2024-05-16: qty 20

## 2024-05-16 MED ORDER — ORAL CARE MOUTH RINSE: 15.0000 mL | Freq: Once | OROMUCOSAL | Status: AC

## 2024-05-16 MED ORDER — DROPERIDOL 2.5 MG/ML IJ SOLN: 0.6250 mg | Freq: Once | INTRAMUSCULAR | Status: DC | PRN

## 2024-05-16 MED ORDER — DOCUSATE SODIUM 100 MG PO CAPS: 100.0000 mg | ORAL_CAPSULE | Freq: Two times a day (BID) | ORAL | 0 refills | Status: AC | PRN

## 2024-05-16 MED ORDER — MIDAZOLAM HCL 2 MG/2ML IJ SOLN
INTRAMUSCULAR | Status: DC | PRN
  Administered 2024-05-16: 2 mg via INTRAVENOUS

## 2024-05-16 SURGICAL SUPPLY — 31 items
BLADE SURG 15 STRL LF DISP TIS (BLADE) ×1 IMPLANT
BRIEF MESH DISP 2XL (UNDERPADS AND DIAPERS) ×1 IMPLANT
CNTNR URN SCR LID CUP LEK RST (MISCELLANEOUS) IMPLANT
DRAPE PERI LITHO V/GYN (MISCELLANEOUS) ×1 IMPLANT
DRSG GAUZE FLUFF 36X18 (GAUZE/BANDAGES/DRESSINGS) ×1 IMPLANT
ELECTRODE REM PT RTRN 9FT ADLT (ELECTROSURGICAL) ×1 IMPLANT
GAUZE 4X4 16PLY ~~LOC~~+RFID DBL (SPONGE) IMPLANT
GAUZE SPONGE 4X4 12PLY STRL (GAUZE/BANDAGES/DRESSINGS) IMPLANT
GLOVE BIOGEL PI IND STRL 7.0 (GLOVE) ×1 IMPLANT
GLOVE SURG SYN 6.5 ES PF (GLOVE) ×2 IMPLANT
GLOVE SURG SYN 6.5 PF PI (GLOVE) ×3 IMPLANT
GOWN STRL REUS W/ TWL LRG LVL3 (GOWN DISPOSABLE) ×3 IMPLANT
HEMOSTAT SURGICEL 2X14 (HEMOSTASIS) IMPLANT
KIT TURNOVER CYSTO (KITS) ×1 IMPLANT
LABEL OR SOLS (LABEL) ×1 IMPLANT
MANIFOLD NEPTUNE II (INSTRUMENTS) ×1 IMPLANT
NDL HYPO 22X1.5 SAFETY MO (MISCELLANEOUS) ×1 IMPLANT
NDL SAFETY ECLIPSE 18X1.5 (NEEDLE) ×1 IMPLANT
NEEDLE HYPO 22X1.5 SAFETY MO (MISCELLANEOUS) IMPLANT
NS IRRIG 500ML POUR BTL (IV SOLUTION) ×1 IMPLANT
PACK BASIN MINOR ARMC (MISCELLANEOUS) ×1 IMPLANT
PAD PREP OB/GYN DISP 24X41 (PERSONAL CARE ITEMS) ×1 IMPLANT
SOLUTION PREP PVP 2OZ (MISCELLANEOUS) ×1 IMPLANT
SURGILUBE 2OZ TUBE FLIPTOP (MISCELLANEOUS) ×1 IMPLANT
SUT SILK 0 SH 30 (SUTURE) IMPLANT
SUT VIC AB 3-0 SH 27X BRD (SUTURE) IMPLANT
SWAB CULTURE AMIES ANAERIB BLU (MISCELLANEOUS) IMPLANT
SYR 10ML LL (SYRINGE) ×2 IMPLANT
TOWEL OR 17X26 4PK STRL BLUE (TOWEL DISPOSABLE) IMPLANT
TRAP FLUID SMOKE EVACUATOR (MISCELLANEOUS) ×1 IMPLANT
WATER STERILE IRR 500ML POUR (IV SOLUTION) ×1 IMPLANT

## 2024-05-16 NOTE — Transfer of Care (Signed)
 Immediate Anesthesia Transfer of Care Note  Patient: Monica Gonzalez  Procedure(s) Performed: ERASMO UNDER ANESTHESIA, RECTUM, INCISIONAL BIOPSY OF ANAL MASS (Anus)  Patient Location: PACU  Anesthesia Type:General  Level of Consciousness: drowsy  Airway & Oxygen Therapy: Patient Spontanous Breathing and Patient connected to face mask oxygen  Post-op Assessment: Report given to RN  Post vital signs: stable  Last Vitals:  Vitals Value Taken Time  BP 146/82 05/16/24 17:52  Temp 36.4 C 05/16/24 17:52  Pulse 89 05/16/24 17:58  Resp 16 05/16/24 17:58  SpO2 96 % 05/16/24 17:58  Vitals shown include unfiled device data.  Last Pain:  Vitals:   05/16/24 1752  TempSrc:   PainSc: 0-No pain         Complications: No notable events documented.

## 2024-05-16 NOTE — Discharge Instructions (Addendum)
 Removal, Care After This sheet gives you information about how to care for yourself after your procedure. Your health care provider may also give you more specific instructions. If you have problems or questions, contact your health care provider. What can I expect after the procedure? After the procedure, it is common to have: Soreness. Bruising. Itching. Follow these instructions at home: site care Follow instructions from your health care provider about how to take care of your site. Make sure you: There is a special gauze that will look black and sticky over wound.  This can be disposed after first time using bathroom Keep area covered with gauze and/or sanitary pads, change as needed. Expect intermittent bleeding discharge from area for next couple weeks If bleeding severe, apply pressure to area for If bleeding does not stop, call office OK TO SHOWER IN 48HRS  Check your site every day for signs of infection. Check for: Redness, swelling, or pain. Fluid or blood. Warmth. Pus or a bad smell.  General instructions Rest and then return to your normal activities as told by your health care provider.  tylenol  and advil  as needed for discomfort.  Please alternate between the two every four hours as needed for pain.    Use narcotics, if prescribed, only when tylenol  and motrin  is not enough to control pain.  325-650mg  every 8hrs to max of 3000mg /24hrs (including the 325mg  in every norco dose) for the tylenol .    Advil  up to 800mg  per dose every 8hrs as needed for pain.   Keep all follow-up visits as told by your health care provider. This is important. Contact a health care provider if: You have redness, swelling, or pain around your site. You have fluid or blood coming from your site. Your site feels warm to the touch. You have pus or a bad smell coming from your site. You have a fever. Your sutures, skin glue, or adhesive strips loosen or come off sooner than expected. Get  help right away if: You have bleeding that does not stop with pressure or a dressing. Summary After the procedure, it is common to have some soreness, bruising, and itching at the site. Follow instructions from your health care provider about how to take care of your site. Check your site every day for signs of infection. Contact a health care provider if you have redness, swelling, or pain around your site, or your site feels warm to the touch. Keep all follow-up visits as told by your health care provider. This is important. This information is not intended to replace advice given to you by your health care provider. Make sure you discuss any questions you have with your health care provider. Document Released: 11/27/2015 Document Revised: 04/30/2018 Document Reviewed: 04/30/2018 Elsevier Interactive Patient Education  [employer].

## 2024-05-16 NOTE — Anesthesia Postprocedure Evaluation (Signed)
 Anesthesia Post Note  Patient: Megin Consalvo Cecchi  Procedure(s) Performed: ERASMO UNDER ANESTHESIA, RECTUM, INCISIONAL BIOPSY OF ANAL MASS (Anus)  Patient location during evaluation: PACU Anesthesia Type: General Level of consciousness: awake and alert, oriented and patient cooperative Pain management: pain level controlled Vital Signs Assessment: post-procedure vital signs reviewed and stable Respiratory status: spontaneous breathing, nonlabored ventilation and respiratory function stable Cardiovascular status: blood pressure returned to baseline and stable Postop Assessment: adequate PO intake Anesthetic complications: no   No notable events documented.   Last Vitals:  Vitals:   05/16/24 1800 05/16/24 1814  BP: (!) 125/90   Pulse: 84 78  Resp: 15 14  Temp:    SpO2: 98% 95%    Last Pain:  Vitals:   05/16/24 1752  TempSrc:   PainSc: 0-No pain                 Alfonso Ruths

## 2024-05-16 NOTE — Interval H&P Note (Signed)
 History and Physical Interval Note:  05/16/2024 4:49 PM  Monica Gonzalez  has presented today for surgery, with the diagnosis of K61.0   Perianal abscess.  The various methods of treatment have been discussed with the patient and family. After consideration of risks, benefits and other options for treatment, the patient has consented to  Procedure(s): EXAM UNDER ANESTHESIA, RECTUM (N/A) as a surgical intervention.  The patient's history has been reviewed, patient examined, no change in status, stable for surgery.  I have reviewed the patient's chart and labs.  Questions were answered to the patient's satisfaction.     [name]

## 2024-05-16 NOTE — Anesthesia Procedure Notes (Signed)
 Procedure Name: Intubation Date/Time: 05/16/2024 5:09 PM  Performed by: Rosine Shona Jansky, CRNAPre-anesthesia Checklist: Patient identified, Emergency Drugs available, Suction available and Patient being monitored Patient Re-evaluated:Patient Re-evaluated prior to induction Oxygen Delivery Method: Circle system utilized Preoxygenation: Pre-oxygenation with 100% oxygen Induction Type: IV induction Ventilation: Mask ventilation without difficulty Laryngoscope Size: McGrath and 3 Grade View: Grade I Tube type: Oral Tube size: 6.5 mm Number of attempts: 1 Airway Equipment and Method: Stylet and Oral airway Placement Confirmation: ETT inserted through vocal cords under direct vision, positive ETCO2 and breath sounds checked- equal and bilateral Secured at: 21 cm Tube secured with: Tape Dental Injury: Teeth and Oropharynx as per pre-operative assessment  Comments: Easy mask.  Grade 1 view.  Difficulty passing ETT due to large amt breast tissue, small mouth, and jagged teeth.  #7 tube passed over bougie under direct visualization. Cuff noted to be torn-- immediatley removed, pt oxygenated and #6.5 ETT passed over bougie under grade 1 view.

## 2024-05-16 NOTE — Op Note (Signed)
 Pre-Op Dx: Perianal abscess Post-Op Dx: Anal mass Anesthesia: GETA EBL: 10 mL Complications:  none apparent Specimen: Anal mass tissue, anal mass culture swabs Procedure: Exam under anesthesia, incisional biopsy of anal mass Surgeon: Tye  Indications for procedure: See H&P  Description of Procedure:  Consent obtained, time out performed.  Patient placed in left lateral position.  Area sterilized and draped in usual position.  Timeout performed.  External examination notes previously noted firm abscess-like tissue with at the anal verge with no purulent drainage today.  Pinpoint openings on the surface still noted.  Adjacent to this is what looks like more typical swollen hemorrhoidal tissue in the right anterior  Aspect.  The firm abscess-like tissue extends from the posterior midline towards the left lateral aspect.  Digital rectal exam confirms the same consistency like tissue within the rectal vault.  Extensive bleeding noted after gentle DRE.  Area now is more concerning for anal neoplasm rather than a isolated abscess.  Decision made to proceed with incisional biopsy and drainage of any possible purulent pockets.  Local infused around the area and an incisional biopsy performed at the midline aspect of the mass.  Removal of the overlying epidermis and probing of the area noted no pockets of purulence.  The tissue underneath the initially normal-looking epidermis was extremely friable but firm, consistent with likely neoplasm.  Multiple tissue samples taken from this area.  Hemostasis achieved afterwards with electrocautery, and Surgicel placement and manual pressure.  Once hemostasis confirmed, 4 x 4 fluff gauze applied to area and secured in place using mesh undergarments.  Pt tolerated procedure well, and transferred to PACU in stable condition. Sponge and instrument count correct at end of procedure.

## 2024-05-16 NOTE — Anesthesia Preprocedure Evaluation (Addendum)
 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 States she has a referral for a cardiology visit. Denies chest pain or shortness of breath.   Past Medical History: No date: Anemia No date: Asthma No date: Fibromyalgia No date: GERD (gastroesophageal reflux disease) No date: Heart murmur No date: History of hiatal hernia No date: Lupus  Past Surgical History: No date: TONSILLECTOMY No date: TUBAL LIGATION 07/29/2020: VULVECTOMY; N/A     Comment:  Procedure: WIDE EXCISION VULVECTOMY;  Surgeon: Mancil Barter, MD;  Location: [facility] ORS;  Service: Gynecology;                Laterality: N/A;     Reproductive/Obstetrics negative OB ROS                              Anesthesia Physical Anesthesia Plan  ASA: 2  Anesthesia Plan: General   Post-op Pain Management: Toradol  IV (intra-op)* and Ofirmev  IV (intra-op)*   Induction:  Intravenous  PONV Risk Score and Plan: Dexamethasone , Ondansetron  and Midazolam   Airway Management Planned: LMA  Additional Equipment:   Intra-op Plan:   Post-operative Plan: Extubation in OR  Informed Consent: I have reviewed the patients History and Physical, chart, labs and discussed the procedure including the risks, benefits and alternatives for the proposed anesthesia with the patient or authorized representative who has indicated his/her understanding and acceptance.     Dental Advisory Given  Plan Discussed with: CRNA and Surgeon  Anesthesia Plan Comments:         Anesthesia Quick Evaluation

## 2024-05-17 ENCOUNTER — Encounter: Payer: Self-pay | Admitting: Surgery

## 2024-05-21 LAB — SURGICAL PATHOLOGY

## 2024-05-21 LAB — AEROBIC/ANAEROBIC CULTURE W GRAM STAIN (SURGICAL/DEEP WOUND)
Culture: NO GROWTH
Gram Stain: NONE SEEN

## 2024-05-29 ENCOUNTER — Inpatient Hospital Stay

## 2024-05-29 ENCOUNTER — Encounter: Payer: Self-pay | Admitting: Oncology

## 2024-05-29 ENCOUNTER — Inpatient Hospital Stay: Attending: Oncology | Admitting: Oncology

## 2024-05-29 VITALS — BP 96/59 | HR 76 | Temp 97.7°F | Resp 18 | Ht 59.0 in | Wt 191.7 lb

## 2024-05-29 DIAGNOSIS — J45909 Unspecified asthma, uncomplicated: Secondary | ICD-10-CM | POA: Insufficient documentation

## 2024-05-29 DIAGNOSIS — C210 Malignant neoplasm of anus, unspecified: Secondary | ICD-10-CM | POA: Diagnosis present

## 2024-05-29 DIAGNOSIS — K449 Diaphragmatic hernia without obstruction or gangrene: Secondary | ICD-10-CM | POA: Diagnosis not present

## 2024-05-29 DIAGNOSIS — K219 Gastro-esophageal reflux disease without esophagitis: Secondary | ICD-10-CM | POA: Insufficient documentation

## 2024-05-29 DIAGNOSIS — Z791 Long term (current) use of non-steroidal anti-inflammatories (NSAID): Secondary | ICD-10-CM | POA: Insufficient documentation

## 2024-05-29 DIAGNOSIS — N63 Unspecified lump in unspecified breast: Secondary | ICD-10-CM | POA: Diagnosis not present

## 2024-05-29 DIAGNOSIS — B372 Candidiasis of skin and nail: Secondary | ICD-10-CM | POA: Diagnosis not present

## 2024-05-29 DIAGNOSIS — D6862 Lupus anticoagulant syndrome: Secondary | ICD-10-CM | POA: Diagnosis not present

## 2024-05-29 DIAGNOSIS — Z79899 Other long term (current) drug therapy: Secondary | ICD-10-CM | POA: Insufficient documentation

## 2024-05-29 DIAGNOSIS — M797 Fibromyalgia: Secondary | ICD-10-CM | POA: Insufficient documentation

## 2024-05-29 DIAGNOSIS — R011 Cardiac murmur, unspecified: Secondary | ICD-10-CM | POA: Diagnosis not present

## 2024-05-29 DIAGNOSIS — D649 Anemia, unspecified: Secondary | ICD-10-CM | POA: Diagnosis not present

## 2024-05-29 DIAGNOSIS — R590 Localized enlarged lymph nodes: Secondary | ICD-10-CM | POA: Insufficient documentation

## 2024-05-29 HISTORY — DX: Candidiasis of skin and nail: B37.2

## 2024-05-29 LAB — COMPREHENSIVE METABOLIC PANEL WITH GFR
ALT: 20 U/L (ref 0–44)
AST: 26 U/L (ref 15–41)
Albumin: 3.7 g/dL (ref 3.5–5.0)
Alkaline Phosphatase: 73 U/L (ref 38–126)
Anion gap: 7 (ref 5–15)
BUN: 10 mg/dL (ref 6–20)
CO2: 23 mmol/L (ref 22–32)
Calcium: 8.6 mg/dL — ABNORMAL LOW (ref 8.9–10.3)
Chloride: 105 mmol/L (ref 98–111)
Creatinine, Ser: 0.71 mg/dL (ref 0.44–1.00)
GFR, Estimated: >60 mL/min (ref >60)
Glucose, Bld: 90 mg/dL (ref 70–99)
Potassium: 3.9 mmol/L (ref 3.5–5.1)
Sodium: 135 mmol/L (ref 135–145)
Total Bilirubin: 0.6 mg/dL (ref 0.0–1.2)
Total Protein: 6.9 g/dL (ref 6.5–8.1)

## 2024-05-29 LAB — HIV ANTIBODY (ROUTINE TESTING W REFLEX): HIV Screen 4th Generation wRfx: NONREACTIVE

## 2024-05-29 MED ORDER — NYSTATIN 100000 UNIT/GM EX POWD: 1.0000 | Freq: Two times a day (BID) | CUTANEOUS | 0 refills | Status: DC

## 2024-05-29 NOTE — Progress Notes (Addendum)
 Hematology/Oncology Consult Note [phone] [phone]     REFERRING PROVIDER: Tye Millet, DO    CHIEF COMPLAINTS/PURPOSE OF CONSULTATION:  Anal squamous cell carcinoma  ASSESSMENT & PLAN:   Anal squamous cell carcinoma (HCC) Physical findings and pathology results were reviewed and discussed with patient. Check HIV. Refer to gyn for pelvic exam and pap smear Obtain staging CT chest abdomen pelvis w contrast.  Recommend concurrent chemotherapy 5-FU and mitomycin C with Radiation.  Will arrange chemotherapy class, port placement once CT results. .   Addendum - CT scan showed pathologically enlarged left inguinal and borderline enlarged right inguinal lymph node.  Recommend biopsy of bilateral inguinal lymph nodes.   Skin candidiasis Recommend nystatin  powder topical 2-3 times daily.  Breast mass in female Gynecology has ordered mammogram for evaluation.   Orders Placed This Encounter  Procedures   CT CHEST ABDOMEN PELVIS W CONTRAST    Standing Status:   Future    Expected Date:   06/05/2024    Expiration Date:   05/29/2025    If indicated for the ordered procedure, I authorize the administration of contrast media per Radiology protocol:   Yes    Does the patient have a contrast media/X-ray dye allergy?:   No    Preferred imaging location?:   [facility]    If indicated for the ordered procedure, I authorize the administration of oral contrast media per Radiology protocol:   Yes   HIV Antibody (routine testing w rflx)    Standing Status:   Future    Number of Occurrences:   1    Expected Date:   05/29/2024    Expiration Date:   05/29/2025   Comprehensive metabolic panel with GFR    Standing Status:   Future    Number of Occurrences:   1    Expected Date:   05/29/2024    Expiration Date:   05/29/2025   Follow-up to be determined. All questions were answered. The patient knows to call the clinic with any problems, questions or  concerns.  [name], MD, PhD [facility] Hematology Oncology 05/29/2024    HISTORY OF PRESENTING ILLNESS:  Monica Gonzalez 48 y.o. female presents to establish care for anal squamous cell carcinoma I have reviewed her chart and materials related to her cancer extensively and collaborated history with the patient. Summary of oncologic history is as follows: Oncology History  Anal squamous cell carcinoma (HCC)  05/29/2024 Initial Diagnosis   Anal squamous cell carcinoma   Patient has noticed a hard mass at the anal area which she initially thought was a hemorrhoid.  The mass has been present for more than a month and appears to be enlarging. Patient was referred to establish care with surgeon Dr. Tye  05/16/2024, incisional biopsy of anal mass showed  1. Anus, biopsy, mass :       - INVASIVE MODERATELY DIFFERENTIATED SQUAMOUS CELL CARCINOMA  Patient has a history of VIN 3 status post vulvectomy   05/29/2024 Cancer Staging   Staging form: Anus, AJCC V9 - Clinical stage from 05/29/2024: Stage IIA (cT2, cN0, cM0) - Signed by Cap Zelphia, MD on 05/29/2024 Stage prefix: Initial diagnosis   Patient presented to establish care.  MEDICAL HISTORY:  Past Medical History:  Diagnosis Date   Anemia    Asthma    Cancer (HCC)    Fibromyalgia    GERD (gastroesophageal reflux disease)    Heart murmur    History of hiatal hernia  Lupus     SURGICAL HISTORY: Past Surgical History:  Procedure Laterality Date   COLONOSCOPY WITH PROPOFOL  N/A 02/17/2023   Procedure: COLONOSCOPY WITH PROPOFOL ;  Surgeon: Maryruth Ole DASEN, MD;  Location: [facility];  Service: Endoscopy;  Laterality: N/A;   ESOPHAGOGASTRODUODENOSCOPY (EGD) WITH PROPOFOL  N/A 02/17/2023   Procedure: ESOPHAGOGASTRODUODENOSCOPY (EGD) WITH PROPOFOL ;  Surgeon: Maryruth Ole DASEN, MD;  Location: [facility] END[facility];  Service: Endoscopy;  Laterality: N/A;   RECTAL EXAM UNDER ANESTHESIA N/A 05/16/2024   Procedure: EXAM UNDER ANESTHESIA, RECTUM,  INCISIONAL BIOPSY OF ANAL MASS;  Surgeon: Tye Millet, DO;  Location: [facility] ORS;  Service: General;  Laterality: N/A;   TONSILLECTOMY     TUBAL LIGATION     VULVECTOMY N/A 07/29/2020   Procedure: WIDE EXCISION VULVECTOMY;  Surgeon: Mancil Barter, MD;  Location: [facility] ORS;  Service: Gynecology;  Laterality: N/A;    SOCIAL HISTORY: Social History   Socioeconomic History   Marital status: Legally Separated    Spouse name: Not on file   Number of children: 3   Years of education: Not on file   Highest education level: Not on file  Occupational History   Not on file  Tobacco Use   Smoking status: Never   Smokeless tobacco: Never  Vaping Use   Vaping status: Never Used  Substance and Sexual Activity   Alcohol use: Not Currently   Drug use: No   Sexual activity: Yes  Other Topics Concern   Not on file  Social History Narrative   Not on file   Social Drivers of Health   Financial Resource Strain: Medium Risk (05/15/2024)   Received from [facility]   Overall Financial Resource Strain (CARDIA)    Difficulty of Paying Living Expenses: Somewhat hard  Food Insecurity: No Food Insecurity (05/29/2024)   Hunger Vital Sign    Worried About Running Out of Food in the Last Year: Never true    Ran Out of Food in the Last Year: Never true  Transportation Needs: No Transportation Needs (05/29/2024)   PRAPARE - Administrator, Civil Service (Medical): No    Lack of Transportation (Non-Medical): No  Physical Activity: Not on file  Stress: Not on file  Social Connections: Not on file  Intimate Partner Violence: Not At Risk (05/29/2024)   Humiliation, Afraid, Rape, and Kick questionnaire    Fear of Current or Ex-Partner: No    Emotionally Abused: No    Physically Abused: No    Sexually Abused: No    FAMILY HISTORY: Family History  Problem Relation Age of Onset   Diabetes Mother    Congestive Heart Failure Mother    Lupus Maternal Grandmother      ALLERGIES:  is allergic to prednisone.  MEDICATIONS:  Current Outpatient Medications  Medication Sig Dispense Refill   acetaminophen  (TYLENOL ) 325 MG tablet Take 2 tablets (650 mg total) by mouth every 8 (eight) hours as needed for mild pain (pain score 1-3). 40 tablet 0   albuterol  (PROVENTIL ) (2.5 MG/3ML) 0.083% nebulizer solution Take 2.5 mg by nebulization every 6 (six) hours as needed for shortness of breath or wheezing.     albuterol  (VENTOLIN  HFA) 108 (90 Base) MCG/ACT inhaler Inhale 2 puffs into the lungs every 6 (six) hours as needed for wheezing or shortness of breath. 8 g 2   busPIRone (BUSPAR) 5 MG tablet Take 5 mg by mouth 2 (two) times daily.     celecoxib (CELEBREX) 200 MG capsule Take 200 mg by  mouth 2 (two) times daily.     clonazePAM (KLONOPIN) 0.5 MG tablet Take 0.5 mg by mouth at bedtime as needed (anxiety/sleep).     cyclobenzaprine  (FLEXERIL ) 5 MG tablet Take 1 tablet (5 mg total) by mouth 3 (three) times daily as needed for muscle spasms. 30 tablet 0   DULoxetine (CYMBALTA) 30 MG capsule Take 30 mg by mouth at bedtime.     ferrous sulfate 325 (65 FE) MG tablet Take 325 mg by mouth daily.      gabapentin (NEURONTIN) 300 MG capsule Take 600 mg by mouth 2 (two) times daily.     ibuprofen  (ADVIL ) 800 MG tablet Take 800 mg by mouth every 8 (eight) hours as needed.     nystatin  (MYCOSTATIN /NYSTOP ) powder Apply 1 Application topically 2 (two) times daily. 20 g 0   pantoprazole (PROTONIX) 40 MG tablet Take by mouth.     SUMAtriptan (IMITREX) 50 MG tablet Take 50 mg by mouth every 2 (two) hours as needed for migraine.     traMADol  (ULTRAM ) 50 MG tablet Take 1 tablet (50 mg total) by mouth every 8 (eight) hours as needed. 6 tablet 0   traZODone (DESYREL) 50 MG tablet Take 50 mg by mouth at bedtime.     No current facility-administered medications for this visit.    Review of Systems  Constitutional:  Negative for appetite change, chills, fatigue and fever.  HENT:    Negative for hearing loss and voice change.   Eyes:  Negative for eye problems.  Respiratory:  Negative for chest tightness and cough.   Cardiovascular:  Negative for chest pain.  Gastrointestinal:  Negative for abdominal distention, abdominal pain and blood in stool.       Anal mass with drainage  Endocrine: Negative for hot flashes.  Genitourinary:  Negative for difficulty urinating and frequency.   Musculoskeletal:  Negative for arthralgias.  Skin:  Negative for itching and rash.  Neurological:  Negative for extremity weakness.  Hematological:  Negative for adenopathy.  Psychiatric/Behavioral:  Negative for confusion.      PHYSICAL EXAMINATION: ECOG PERFORMANCE STATUS: 0 - Asymptomatic  Vitals:   05/29/24 1116  BP: (!) 96/59  Pulse: 76  Resp: 18  Temp: 97.7 F (36.5 C)  SpO2: 100%   Filed Weights   05/29/24 1116  Weight: 191 lb 11.2 oz (87 kg)    Physical Exam Constitutional:      General: She is not in acute distress.    Appearance: She is not diaphoretic.  HENT:     Head: Normocephalic and atraumatic.     Nose: Nose normal.     Mouth/Throat:     Pharynx: No oropharyngeal exudate.  Eyes:     General: No scleral icterus.    Pupils: Pupils are equal, round, and reactive to light.  Cardiovascular:     Rate and Rhythm: Normal rate and regular rhythm.     Heart sounds: No murmur heard. Pulmonary:     Effort: Pulmonary effort is normal. No respiratory distress.     Breath sounds: No rales.  Chest:     Chest wall: No tenderness.  Abdominal:     General: There is no distension.     Palpations: Abdomen is soft.     Tenderness: There is no abdominal tenderness.  Genitourinary:    Comments: Firm anal mass 4-5cm Musculoskeletal:        General: Normal range of motion.     Cervical back: Normal range of motion and neck  supple.  Skin:    General: Skin is warm and dry.     Findings: No erythema.     Comments: Erythematous, well-demarcated plaques of inguinal  creases  Neurological:     Mental Status: She is alert and oriented to person, place, and time.     Cranial Nerves: No cranial nerve deficit.     Motor: No abnormal muscle tone.     Coordination: Coordination normal.  Psychiatric:        Mood and Affect: Affect normal.   05/29/24     LABORATORY DATA:  I have reviewed the data as listed    Latest Ref Rng & Units 02/19/2024   12:16 PM 06/28/2023    3:29 PM 06/22/2023    2:13 PM  CBC  WBC 4.0 - 10.5 K/uL 6.3  9.7  5.6   Hemoglobin 12.0 - 15.0 g/dL 86.1  85.9  86.5   Hematocrit 36.0 - 46.0 % 41.8  45.5  43.1   Platelets 150 - 400 K/uL 269  309  305       Latest Ref Rng & Units 05/29/2024   11:37 AM 02/19/2024   12:16 PM 06/28/2023    3:29 PM  CMP  Glucose 70 - 99 mg/dL 90  897  84   BUN 6 - 20 mg/dL 10  7  9    Creatinine 0.44 - 1.00 mg/dL 9.28  9.19  9.15   Sodium 135 - 145 mmol/L 135  141  139   Potassium 3.5 - 5.1 mmol/L 3.9  3.8  3.7   Chloride 98 - 111 mmol/L 105  111  108   CO2 22 - 32 mmol/L 23  23  20    Calcium 8.9 - 10.3 mg/dL 8.6  8.7  8.6   Total Protein 6.5 - 8.1 g/dL 6.9  6.2    Total Bilirubin 0.0 - 1.2 mg/dL 0.6  0.9    Alkaline Phos 38 - 126 U/L 73  53    AST 15 - 41 U/L 26  28    ALT 0 - 44 U/L 20  24       RADIOGRAPHIC STUDIES: I have personally reviewed the radiological images as listed and agreed with the findings in the report. No results found.

## 2024-05-29 NOTE — Progress Notes (Signed)
START ON PATHWAY REGIMEN - Anal Carcinoma     One cycle, concurrent with RT:     Fluorouracil      Mitomycin   **Always confirm dose/schedule in your pharmacy ordering system**  Patient Characteristics: Anal Canal Tumors, Newly Diagnosed - Locoregional Disease (Clinical Staging) Therapeutic Status: Newly Diagnosed - Locoregional Disease (Clinical Staging) AJCC T Category: cT2 AJCC N Category: cN0 AJCC M Category: cM0 AJCC 9 Stage Grouping: IIA Check here if patient was staged using an edition other than AJCC Staging 9th Edition: false Intent of Therapy: Curative Intent, Discussed with Patient

## 2024-05-29 NOTE — Assessment & Plan Note (Addendum)
 Physical findings and pathology results were reviewed and discussed with patient. Check HIV. Refer to gyn for pelvic exam and pap smear Obtain staging CT chest abdomen pelvis w contrast.  Recommend concurrent chemotherapy 5-FU and mitomycin C with Radiation.  Will arrange chemotherapy class, port placement once CT results. .   Addendum - CT scan showed pathologically enlarged left inguinal and borderline enlarged right inguinal lymph node.  Recommend biopsy of bilateral inguinal lymph nodes.

## 2024-05-29 NOTE — Assessment & Plan Note (Signed)
 Recommend nystatin  powder topical 2-3 times daily.

## 2024-05-29 NOTE — Progress Notes (Signed)
 Referral sent to Dr. Lovetta to see back for pelvic exam and updated pap smear.

## 2024-05-30 ENCOUNTER — Other Ambulatory Visit: Payer: Self-pay

## 2024-06-05 ENCOUNTER — Ambulatory Visit
Admission: RE | Admit: 2024-06-05 | Discharge: 2024-06-05 | Disposition: A | Discharge status: Home / Self Care | Source: Ambulatory Visit | Attending: Oncology | Admitting: Oncology

## 2024-06-05 DIAGNOSIS — C210 Malignant neoplasm of anus, unspecified: Secondary | ICD-10-CM | POA: Diagnosis present

## 2024-06-05 MED ORDER — IOHEXOL 300 MG/ML  SOLN
100.0000 mL | Freq: Once | INTRAMUSCULAR | Status: AC | PRN
Start: 2024-06-05 — End: 2024-06-05
  Administered 2024-06-05: 100 mL via INTRAVENOUS

## 2024-06-06 ENCOUNTER — Encounter: Payer: Self-pay | Admitting: Oncology

## 2024-06-07 ENCOUNTER — Other Ambulatory Visit: Payer: Self-pay

## 2024-06-07 ENCOUNTER — Telehealth: Payer: Self-pay

## 2024-06-07 ENCOUNTER — Emergency Department

## 2024-06-07 ENCOUNTER — Emergency Department: Admission: EM | Admit: 2024-06-07 | Discharge: 2024-06-07 | Disposition: A | Discharge status: Home / Self Care

## 2024-06-07 DIAGNOSIS — M75102 Unspecified rotator cuff tear or rupture of left shoulder, not specified as traumatic: Secondary | ICD-10-CM | POA: Insufficient documentation

## 2024-06-07 DIAGNOSIS — C20 Malignant neoplasm of rectum: Secondary | ICD-10-CM | POA: Diagnosis not present

## 2024-06-07 DIAGNOSIS — M25512 Pain in left shoulder: Secondary | ICD-10-CM | POA: Diagnosis present

## 2024-06-07 DIAGNOSIS — J45909 Unspecified asthma, uncomplicated: Secondary | ICD-10-CM | POA: Insufficient documentation

## 2024-06-07 DIAGNOSIS — M12812 Other specific arthropathies, not elsewhere classified, left shoulder: Secondary | ICD-10-CM

## 2024-06-07 MED ORDER — LIDOCAINE 5 % EX PTCH
1.0000 | MEDICATED_PATCH | CUTANEOUS | Status: DC
  Administered 2024-06-07: 1 via TRANSDERMAL
  Filled 2024-06-07: qty 1

## 2024-06-07 MED ORDER — CYCLOBENZAPRINE HCL 10 MG PO TABS: 10.0000 mg | ORAL_TABLET | Freq: Three times a day (TID) | ORAL | 0 refills | Status: AC | PRN

## 2024-06-07 MED ORDER — KETOROLAC TROMETHAMINE 30 MG/ML IJ SOLN
30.0000 mg | Freq: Once | INTRAMUSCULAR | Status: AC
  Administered 2024-06-07: 30 mg via INTRAMUSCULAR
  Filled 2024-06-07: qty 1

## 2024-06-07 MED ORDER — CYCLOBENZAPRINE HCL 10 MG PO TABS
10.0000 mg | ORAL_TABLET | Freq: Once | ORAL | Status: AC
  Administered 2024-06-07: 10 mg via ORAL
  Filled 2024-06-07: qty 1

## 2024-06-07 MED ORDER — NAPROXEN 500 MG PO TBEC: 500.0000 mg | DELAYED_RELEASE_TABLET | Freq: Two times a day (BID) | ORAL | 0 refills | Status: DC

## 2024-06-07 MED ORDER — LIDOCAINE 5 % EX PTCH: 1.0000 | MEDICATED_PATCH | CUTANEOUS | 0 refills | Status: AC

## 2024-06-07 NOTE — Discharge Instructions (Addendum)
 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  Please take Flexeril  1 tablet by mouth every 8 hours.  Please avoid driving while taking Flexeril .  Please apply warm patch of lidocaine  onto the skin on your left shoulder every 12 hours.  You can take naproxen 1 tablet by mouth every 12 hours after main meals.  Please do not take Celebrex while taking naproxen.  Please drink plenty fluids.  You can apply warm compresses in your left shoulder before applying the lidocaine  patch.  Please call Dr. Lorelle (orthopedics)  to make an appointment for a follow-up and further studies.  Please come back to ED or go to your PCP if you have new symptoms or symptoms worsen

## 2024-06-07 NOTE — ED Triage Notes (Signed)
 Pt to ED via POV from home. Pt reports yesterday started to have left shoulder pain. Pt reports with movement sharp pain to the top of shoulder joint. Pt reports no relief with muscle relaxer or OTC pain meds. Pt with hx of fibromyalgia.

## 2024-06-07 NOTE — ED Notes (Signed)
 Applied shoulder immobilizer to the pt left shoulder at this time. Pt stated it was comfortable and no discomfort felt at this time.

## 2024-06-07 NOTE — Telephone Encounter (Signed)
 Patient reports FMLA/STD disability request paperwork has been faxed to the office from [employer].  The paperwork has not been received so I left a message with [employer] rep to fax to [phone] Att Powell Sharps and also provided my [facility] fax number.  [employer] rep representative:  Marko Furnace  Phone: [phone] ext [phone] Email:[email or web address]

## 2024-06-07 NOTE — ED Provider Notes (Signed)
 [facility] Provider Note    Event Date/Time   First MD Initiated Contact with Patient 06/07/24 1517     (approximate)   History   Shoulder Pain    HPI  Monica Gonzalez is a 48 y.o. female    with a past medical history of anal squamous cell carcinoma, fibromyalgia, dizziness, spasm of thoracic back muscle, cervical radiculopathy, bronchitis, low back pain, spinal stenosis of cervical region, who presents to the ED complaining of left shoulder pain. According to the patient, symptoms started 24 hours ago with left shoulder pain, mostly in the deltoid area.  Patient states she is unable to wash her hair due to left shoulder pain.  Patient was recently diagnosed with rectal cancer.  Patient is preparing to have chemo and radiotherapy.  Patient is taking at home gabapentin 300 mg every 12 hours, ibuprofen  as needed, and  Celebrex without resolution of her symptoms.  Patient states having history of left shoulder pain a few years ago.  She was seen in [facility].  Patient denies chest pain, shortness of breath, headaches, abdominal pain.  Due to her GI pathology patient states having decreased appetite, and daily diarrhea without blood in the stool.  Patient is allergic to prednisone, developing generalized edema.     Patient Active Problem List   Diagnosis Date Noted  . 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  . Skin candidiasis 05/29/2024  . Painful cervical range of motion 07/24/2023  . Impaired range of motion of cervical spine 07/24/2023  . History of Prednisone Allergy 07/04/2023    Class: History of  . 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, C5-6) 06/07/2023  . Cervical sensory radiculopathy (Left) 06/07/2023  . Chronic upper extremity pain (Left) 06/07/2023  . Shoulder radicular pain (Left) 06/07/2023  . Chronic neck pain (1ry area of Pain) (Bilateral) (L>R) 05/22/2023  . Chronic shoulder pain (2ry area of Pain) (Left) 05/22/2023  . Chronic low back pain  (3ry area of Pain) (Bilateral) (R>L) w/o sciatica 05/22/2023  . Cervicogenic headache (4th area of Pain) (Bilateral) 05/22/2023  . Chronic feet pain (5th area of Pain) (Bilateral) 05/22/2023  . Abnormal MRI, cervical spine (01/16/2023) 05/22/2023  . DDD (degenerative disc disease), cervical 05/22/2023  . 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,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) 05/22/2023  . Cervical facet hypertrophy 05/22/2023  . Grade 1 Retrolisthesis of C4/C5 & C5/C6 05/22/2023  . Hand fracture, sequela (Right) (06/09/2015) 05/22/2023  . Chronic pain syndrome 05/21/2023  . Pharmacologic therapy 05/21/2023  . Disorder of skeletal system 05/21/2023  . Problems influencing health status 05/21/2023  . Fibromyalgia 02/07/2023  . Colles' fracture of radius, sequela (Right) 12/29/2022  . Palpitations 07/23/2020  . 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 Atypical chest pain 06/29/2020  . Family history of premature CAD 06/29/2020  . Asthma 06/13/2019  . Hiatal hernia 06/13/2019  . History of heart murmur in childhood 06/13/2019  . Lupus (systemic lupus erythematosus) (HCC) 06/13/2019     ROS: Patient currently denies any vision changes, tinnitus, difficulty speaking, facial droop, sore throat, chest pain, shortness of breath, abdominal pain, nausea/vomiting/, dysuria, or weakness/numbness/paresthesias in any extremity   Physical Exam   Triage Vital Signs: ED Triage Vitals [06/07/24 1408]  Encounter Vitals Group     BP 113/79     Girls Systolic BP Percentile      Girls Diastolic BP Percentile      Boys Systolic BP Percentile      Boys Diastolic BP Percentile  Pulse Rate 83     Resp 18     Temp 97.9 F (36.6 C)     Temp Source Oral     SpO2 100 %     Weight      Height      Head Circumference      Peak Flow      Pain Score 8     Pain Loc      Pain Education      Exclude from Growth Chart     Most recent vital signs: Vitals:   06/07/24 1408  BP: 113/79  Pulse:  83  Resp: 18  Temp: 97.9 F (36.6 C)  SpO2: 100%     Physical Exam Vitals and nursing note reviewed.  During triage vital signs were normal  Constitutional:      General: Awake and alert. No acute distress.  Patient is laying down on her right side due to rectal pain.     Appearance: Normal appearance. The patient is normal weight.      Able to speak in complete sentences without cough or dyspnea  HENT:     Head: Normocephalic and atraumatic.     Mouth: Mucous membranes are moist.  Eyes:     General: PERRL. Normal EOMs          Conjunctiva/sclera: Conjunctivae normal.  Nose No congestion/rhinorrhea  CV:                  Good peripheral perfusion.  Regular rate and rhythm  Resp:               Normal effort.  Equal breath sounds bilaterally.  Abd:                 No distention.  Soft, nontender.  No rebound or guarding.  Musculoskeletal:        General: No swelling. Normal range of motion.  Left shoulder: Skin is intact, no ecchymosis no hematomas.  Empty can positive, full ROM limited by pain.  Sensation is intact.  Strength 5/5.  Pulse is positive.  Skin:    General: Skin is warm and dry.     Capillary Refill: Capillary refill takes less than 2 seconds.     Findings: No rash.  Neurological:     Mental Status: The patient is awake and alert. MAE spontaneously. No gross focal neurologic deficits are appreciated.  Psychiatric Mood and affect are normal. Speech and behavior are normal.  ED Results / Procedures / Treatments   Labs (all labs ordered are listed, but only abnormal results are displayed) Labs Reviewed - No data to display   EKG    RADIOLOGY I independently reviewed and interpreted imaging and agree with radiologists findings.      PROCEDURES:  Critical Care performed:   Procedures   MEDICATIONS ORDERED IN ED: Medications  lidocaine  (LIDODERM ) 5 % 1 patch (1 patch Transdermal Patch Applied 06/07/24 1609)  ketorolac  (TORADOL ) 30 MG/ML injection 30  mg (30 mg Intramuscular Given 06/07/24 1612)  cyclobenzaprine  (FLEXERIL ) tablet 10 mg (10 mg Oral Given 06/07/24 1608)   Clinical Course as of 06/07/24 1642  Fri Jun 07, 2024  1522 DG Shoulder Left Negative. [AE]  1641 Reassessed the patient after getting Toradol , Flexeril , lidocaine  patch and a sling.  Explained to the patient the need to referral to orthopedics.  Patient is ready for discharge.  Patient is agreeable with the plan. [AE]    Clinical Course User Index [AE]  Janit Kast, PA-C    IMPRESSION / MDM / ASSESSMENT AND PLAN / ED COURSE  I reviewed the triage vital signs and the nursing notes.  Differential diagnosis includes, but is not limited to, rotator cuff tear, cervical radiculopathy, unlikely fracture or dislocation due to absence of trauma.   Patient's presentation is most consistent with acute complicated illness / injury requiring diagnostic workup.    Monica Gonzalez is a 48 y.o., female who presents today with acute onset of left shoulder pain in the last 24 hours, patient is taking ibuprofen , gabapentin, Flexeril  without improvement.  At physical exam left shoulder,  empty can sign positive, full ROM limited by pain.  X-ray was ordered on triage, results are negative for fracture or dislocation.  Plan is to give Toradol , lidocaine  patch, Flexeril  to decrease the pain and apply sling.  Patient is not driving today.  I did explain the patient the need to be referred to orthopedics for further studies. Patient's diagnosis is consistent with left shoulder rotator cuff arthropathy. I independently reviewed and interpreted imaging and agree with radiologists findings.  I did not order labs.  Physical exam is reassuring. I did review the patient's allergies and medications. Patient is allergic to prednisone.  The patient is in stable and satisfactory condition for discharge home.  Patient will be discharged home with prescriptions for lidocaine  patch, Flexeril , naproxen .  I did  advise the patient not to drive while taking Flexeril .  Patient is to follow up with orthopedics for further studies as needed or otherwise directed. Patient is given ED precautions to return to the ED for any worsening or new symptoms. Discussed plan of care with patient, answered all of patient's questions, Patient agreeable to plan of care. Advised patient to take medications according to the instructions on the label. Discussed possible side effects of new medications. Patient verbalized understanding.  FINAL CLINICAL IMPRESSION(S) / ED DIAGNOSES   Final diagnoses:  Rotator cuff arthropathy of left shoulder     Rx / DC Orders   ED Discharge Orders          Ordered    lidocaine  (LIDODERM ) 5 %  Every 24 hours        06/07/24 1619    cyclobenzaprine  (FLEXERIL ) 10 MG tablet  3 times daily PRN        06/07/24 1619    naproxen  (EC NAPROSYN ) 500 MG EC tablet  2 times daily with meals        06/07/24 1619             Note:  This document was prepared using Dragon voice recognition software and may include unintentional dictation errors.   Janit Kast, PA-C 06/07/24 1642    Clarine Ozell LABOR, MD 06/08/24 1102

## 2024-06-10 ENCOUNTER — Encounter: Payer: Self-pay | Admitting: Oncology

## 2024-06-10 ENCOUNTER — Other Ambulatory Visit: Payer: Self-pay

## 2024-06-10 DIAGNOSIS — C210 Malignant neoplasm of anus, unspecified: Secondary | ICD-10-CM

## 2024-06-10 NOTE — Telephone Encounter (Signed)
 FMLA paperwork complete pending Dr. Babara signature.

## 2024-06-11 ENCOUNTER — Telehealth: Payer: Self-pay

## 2024-06-11 ENCOUNTER — Encounter: Payer: Self-pay | Admitting: Oncology

## 2024-06-11 NOTE — Telephone Encounter (Signed)
New phone note created.

## 2024-06-11 NOTE — Telephone Encounter (Signed)
 Per Dr. Babara, this could be from decreased rectal tone from the cancer

## 2024-06-11 NOTE — Telephone Encounter (Addendum)
 Please advise.  Patient sent this message via MyChart but sent on a 06/07/24 encounter:  Question please advise when I use the bathroom and go to bed it like I am leakage fecus is this normal

## 2024-06-12 NOTE — Progress Notes (Signed)
 Chief Complaint:    Monica Gonzalez is a 48 y.o. female here for Rf Dr Tonia and breast exam .consult from Dr Babara for gyn exam  History of Present Illness Monica Gonzalez is a 48 year old female who presents for a gynecologic evaluation after a recent diagnosis of anal cancer.  A recent CT scan of the chest and abdomen revealed a right-sided upper breast mass. She has no breast pain or discharge. There is no family history of breast cancer.  The CT scan also showed a left-sided ovarian cystic lesion measuring 2.6 centimeters. Additionally, there was a possible enlarged left inguinal lymph node.   Past Medical History:  has a past medical history of Acute stomach ulcer, Anemia, Anxiety, Asthma (HHS-HCC), Bronchitis, chronic (CMS/HHS-HCC), GERD (gastroesophageal reflux disease), Heart murmur, Hiatal hernia, History of cancer, History of pneumonia, Kidney stones, Lupus, and Vulvar intraepithelial neoplasia (VIN) grade 3.  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 Family History: family history includes Asthma in her mother; Atrial fibrillation (Abnormal heart rhythm sometimes requiring treatment with blood thinners) in her maternal grandmother; Autoimmune disease in her maternal grandmother; COPD in her mother; Cancer in her paternal grandfather; Clotting disorder in her maternal grandmother; Coronary Artery Disease (Blocked arteries around heart) in her father and mother; Diabetes type II in her mother; Heart failure in her maternal grandmother; High blood pressure (Hypertension) in her maternal grandmother; Kidney failure in her mother; Lupus in her maternal grandmother; Myocardial Infarction (Heart attack) in her mother; No Known Problems in her half-brother and half-sister; Prostate  cancer in her maternal grandfather. Social History:  reports that she quit smoking about 22 years ago. Her smoking use included cigarettes. She has never used smokeless tobacco. She reports current alcohol use. She reports that she does not use drugs. OB/GYN History:  OB History     Gravida  6   Para  3   Term  3   Preterm      AB  3   Living  3      SAB      IAB      Ectopic      Molar      Multiple      Live Births  3          Allergies: is allergic to prednisone. Medications:  Current Outpatient Medications:  .  busPIRone (BUSPAR) 5 MG tablet, Take 1 tablet (5 mg total) by mouth 2 (two) times daily, Disp: 60 tablet, Rfl: 11 .  calcium carbonate (TUMS ORAL), Take 2 each by mouth every 4 (four) hours as needed, Disp: , Rfl:  .  celecoxib (CELEBREX) 200 MG capsule, Take 1 capsule by mouth twice daily, Disp: 60 capsule, Rfl: 0 .  clonazePAM (KLONOPIN) 0.5 MG tablet, Take 1 tablet (0.5 mg total) by mouth at bedtime as needed, Disp: 20 tablet, Rfl: 2 .  cyclobenzaprine  (FLEXERIL ) 5 MG tablet, Take 1 tablet by mouth three times daily as needed, Disp: 30 tablet, Rfl: 0 .  DULoxetine (CYMBALTA) 30 MG DR capsule, Take 1 capsule (30 mg total) by mouth once daily, Disp: 30 capsule, Rfl: 11 .  ipratropium-albuteroL  (DUO-NEB) nebulizer solution, Inhale 3 mLs into the lungs, Disp: , Rfl:  .  mag hydrox/aluminum hyd/simeth (MAALOX MAXIMUM STRENGTH ORAL), Take 30 mLs by  mouth every 3 (three) hours as needed, Disp: , Rfl:  .  polyethylene glycol (MIRALAX ) powder, Take by mouth, Disp: , Rfl:  .  SUMAtriptan (IMITREX) 50 MG tablet, Take 1 tablet (50 mg total) by mouth as directed for Migraine May take a second dose after 2 hours if needed., Disp: 9 tablet, Rfl: 0 .  tranexamic acid (LYSTEDA) 650 mg tablet, Take 2 tablets (1,300 mg total) by mouth 3 (three) times daily Take for a maximum of 5 days during monthly menstruation., Disp: 30 tablet, Rfl: 3 .  traZODone (DESYREL) 50 MG  tablet, Take 1 tablet (50 mg total) by mouth at bedtime, Disp: 30 tablet, Rfl: 11 .  albuterol  (PROVENTIL ) 2.5 mg /3 mL (0.083 %) nebulizer solution, Take 3 mLs (2.5 mg total) by nebulization every 6 (six) hours as needed for Wheezing, Disp: 75 mL, Rfl: 12 .  albuterol  90 mcg/actuation inhaler, Inhale 2 inhalations into the lungs every 6 (six) hours as needed for Wheezing, Disp: 1 each, Rfl: 1 .  fluticasone-umeclidinium-vilanterol (TRELEGY ELLIPTA) 200-62.5-25 mcg inhaler, Inhale 1 Puff into the lungs once daily (Patient not taking: Reported on 06/12/2024), Disp: 60 each, Rfl: 1 .  gabapentin (NEURONTIN) 300 MG capsule, Take 2 capsules (600 mg total) by mouth 2 (two) times daily, Disp: 360 capsule, Rfl: 3 .  linaCLOtide (LINZESS) 145 mcg capsule, Take 1 capsule (145 mcg total) by mouth once daily Take 30 mins before food (Patient not taking: Reported on 06/12/2024), Disp: 30 capsule, Rfl: 3 .  promethazine -dextromethorphan (PROMETHAZINE -DM) 6.25-15 mg/5 mL syrup, Take 5 mLs by mouth every 6 (six) hours as needed for Cough (Patient not taking: Reported on 06/12/2024), Disp: 118 mL, Rfl: 1  Review of Systems: General:   No fatigue or weight loss Eyes:   No vision changes Ears:   No hearing difficulty Respiratory:                No cough or shortness of breath Pulmonary:   No asthma or shortness of breath Cardiovascular:        No chest pain, palpitations, dyspnea on exertion Gastrointestinal:          No abdominal bloating, chronic diarrhea, constipations, masses, pain or hematochezia Genitourinary:  No hematuria, dysuria, abnormal vaginal discharge, pelvic pain, Menometrorrhagia Lymphatic:  No swollen lymph nodes Musculoskeletal: No muscle weakness Neurologic:  No extremity weakness, syncope, seizure disorder Psychiatric:  No history of depression, delusions or suicidal/homicidal ideation    Exam:   Vitals:   06/12/24 1605  BP: 107/74  Pulse: 79    Body mass index is 39.18 kg/m.  WDWN  white/  female in NAD   Lungs: CTA  CV : RRR without murmur   Breast: exam done in sitting and lying position : No dimpling or retraction, no dominant mass, no spontaneous discharge, no axillary adenopathy Neck:  no thyromegaly Abdomen: soft , no mass, normal active bowel sounds,  non-tender, no rebound tenderness Pelvic: tanner stage 5 ,  External genitalia: vulva /labia no lesions No adenopathy palpated left inguinal  Urethra: no prolapse Vagina: normal physiologic d/c Cervix: no lesions, no cervical motion tenderness   Uterus: normal size shape and contour, non-tender Adnexa: no mass,  non-tender   Rectovaginal:  Pelvic exam done to collect PAP Chaperone present  PHQ 2/9 last 3 flowsheet values     08/07/2019 07/19/2022  PHQ-9 Depression Screening   (OBSOLETE) Little interest or pleasure in doing things 0 0  (OBSOLETE) Feeling down, depressed, or hopeless (or  irritable for Teens only)? 0 0  (OBSOLETE) Total Score = 0 0      Depression Severity and Treatment Recommendations:  0-4= None  5-9= Mild / Treatment: Support, educate to call if worse; return in one month  10-14= Moderate / Treatment: Support, watchful waiting; Antidepressant or Psychotherapy  15-19= Moderately severe / Treatment: Antidepressant OR Psychotherapy  >= 20 = Major depression, severe / Antidepressant AND Psychotherapy  Impression:   The primary encounter diagnosis was Encounter for gynecological examination with abnormal finding. Diagnoses of Cyst of left ovary, Breast mass in female, Anal cancer (CMS/HHS-HCC), Routine cervical smear, and Pelvic pain in female were also pertinent to this visit.    Plan:   Assessment & Plan Anal cancer Recently diagnosed, referred for gynecologic evaluation.  Right breast mass Right upper breast mass identified on recent CT scan, absent on prior scans. No symptoms or family history of breast cancer.schedule diagnostic breast mammogram +/- u/s   Left ovarian  cyst Left ovarian cystic lesion 2.6 cm on CT scan, further characterization needed.schedule TVUS here for further eval   Possible left inguinal lymphadenopathy Possible left inguinal lymph node enlargement on CT scan, requires further evaluation.   Pap done   Orders Placed This Encounter  Procedures  . Mammo diagnostic digital bilateral    Standing Status:   Future    Expected Date:   06/12/2024    Expiration Date:   07/13/2025    Reason for Exam: (Free Text):   right side upper breast mass seen on CT scan    Is the patient pregnant?:   No    Preferred Location::   Other - comment below    Release to patient:   Immediate  . Mammo US  breast limited left    Standing Status:   Future    Expected Date:   06/12/2024    Expiration Date:   07/13/2025    Reason for Exam: (Free Text):   right side upper breast mass seen on CT scan    Is the patient pregnant?:   No    Preferred Location::   Other - comment below    Release to patient:   Immediate  . Mammo US  breast limited right    Standing Status:   Future    Expected Date:   06/12/2024    Expiration Date:   07/13/2025    Reason for Exam: (Free Text):   right side upper breast mass seen on CT scan    Is the patient pregnant?:   No    Preferred Location::   Other - comment below    Release to patient:   Immediate  . IGP, Aptima HPV, rfx 16/18,45 - [facility]    Source cervix Recent dx of anal cancer    [facility] Specimen source for cytology test::   Cervical    Release to patient:   Immediate    No follow-ups on file.  DEBBY CLARYCE DINSMORE, MD   This note has been created using automated tools and reviewed for accuracy by [facility].

## 2024-06-13 ENCOUNTER — Ambulatory Visit: Payer: Self-pay | Admitting: Oncology

## 2024-06-13 ENCOUNTER — Other Ambulatory Visit: Payer: Self-pay | Admitting: Oncology

## 2024-06-13 ENCOUNTER — Other Ambulatory Visit: Payer: Self-pay | Admitting: Obstetrics and Gynecology

## 2024-06-13 DIAGNOSIS — N63 Unspecified lump in unspecified breast: Secondary | ICD-10-CM | POA: Insufficient documentation

## 2024-06-13 DIAGNOSIS — R9389 Abnormal findings on diagnostic imaging of other specified body structures: Secondary | ICD-10-CM

## 2024-06-13 NOTE — Assessment & Plan Note (Signed)
 Gynecology has ordered mammogram for evaluation.

## 2024-06-14 ENCOUNTER — Other Ambulatory Visit: Payer: Self-pay

## 2024-06-14 DIAGNOSIS — R9389 Abnormal findings on diagnostic imaging of other specified body structures: Secondary | ICD-10-CM

## 2024-06-14 NOTE — Telephone Encounter (Addendum)
 Pt sent message earlier regarding CT results. Replied to Mychart message with CT results and MD recommendation.   Request for bx sent to IR  Request for port placement sent to Dr. Estelita office.   Gyn also ordered a mammo and US  of the breast nodule scheduled on 8/4.

## 2024-06-14 NOTE — Progress Notes (Signed)
 Port placement date pending.

## 2024-06-14 NOTE — Telephone Encounter (Signed)
-----   Message from [name] sent at 06/13/2024  9:44 PM EDT ----- Please let patient know that her CT showed enlarged left inguinal node and borderline enlarged right side node. Recommend core needle biopsy of both left and right inguinal nodes ASAP. Thanks.  ----- Message ----- From: Interface, Rad Results In Sent: 06/12/2024  10:51 AM EDT To: [name], MD

## 2024-06-14 NOTE — Progress Notes (Signed)
 Johann Sieving, MD sent to [facility] Clarita RAMAN Also US  bx RIGHT  inguinal LN Per Dr Babara PLENTY recommends biopsy all suspicious lymph nodes. that may make a difference in the radiation field.  Thx DDH       Previous Messages    ----- Message ----- From: Johann Sieving, MD Sent: 06/14/2024  10:18 AM EDT To: Clarita RAMAN Carlie Subject: RE: US  Biopsy                                  PROCEDURE / BIOPSY REVIEW Date: 06/14/24  Requested Biopsy site: L inguinal LAN Reason for request: r/o met Imaging review: Best seen on CT 06/05/24 2:113  Decision: Approved Imaging modality to perform: Ultrasound Schedule with: No sedation / Local anesthetic Schedule for: Any VIR  Additional comments:   Please contact me with questions, concerns, or if issue pertaining to this request arise.  Dayne Sieving Johann, MD [facility]

## 2024-06-15 ENCOUNTER — Other Ambulatory Visit: Payer: Self-pay

## 2024-06-16 ENCOUNTER — Other Ambulatory Visit: Payer: Self-pay

## 2024-06-17 ENCOUNTER — Ambulatory Visit
Admission: RE | Admit: 2024-06-17 | Discharge: 2024-06-17 | Disposition: A | Discharge status: Home / Self Care | Source: Ambulatory Visit | Attending: Obstetrics and Gynecology | Admitting: Obstetrics and Gynecology

## 2024-06-17 ENCOUNTER — Encounter: Payer: Self-pay | Admitting: Obstetrics and Gynecology

## 2024-06-17 ENCOUNTER — Other Ambulatory Visit: Payer: Self-pay | Admitting: Obstetrics and Gynecology

## 2024-06-17 DIAGNOSIS — N63 Unspecified lump in unspecified breast: Secondary | ICD-10-CM | POA: Diagnosis present

## 2024-06-18 ENCOUNTER — Ambulatory Visit
Admission: RE | Admit: 2024-06-18 | Discharge: 2024-06-18 | Disposition: A | Discharge status: Home / Self Care | Source: Ambulatory Visit | Attending: Obstetrics and Gynecology | Admitting: Obstetrics and Gynecology

## 2024-06-18 ENCOUNTER — Inpatient Hospital Stay
Admission: RE | Admit: 2024-06-18 | Discharge: 2024-06-18 | Discharge status: Home / Self Care | Source: Ambulatory Visit | Attending: Obstetrics and Gynecology | Admitting: Obstetrics and Gynecology

## 2024-06-18 DIAGNOSIS — R590 Localized enlarged lymph nodes: Secondary | ICD-10-CM | POA: Diagnosis present

## 2024-06-18 DIAGNOSIS — C50811 Malignant neoplasm of overlapping sites of right female breast: Secondary | ICD-10-CM | POA: Insufficient documentation

## 2024-06-18 DIAGNOSIS — N63 Unspecified lump in unspecified breast: Secondary | ICD-10-CM

## 2024-06-18 DIAGNOSIS — N6310 Unspecified lump in the right breast, unspecified quadrant: Secondary | ICD-10-CM | POA: Insufficient documentation

## 2024-06-18 DIAGNOSIS — R9389 Abnormal findings on diagnostic imaging of other specified body structures: Secondary | ICD-10-CM | POA: Insufficient documentation

## 2024-06-18 HISTORY — PX: BREAST BIOPSY: SHX20

## 2024-06-18 MED ORDER — LIDOCAINE 1 % OPTIME INJ - NO CHARGE
2.0000 mL | Freq: Once | INTRAMUSCULAR | Status: AC
  Administered 2024-06-18: 2 mL
  Filled 2024-06-18: qty 2

## 2024-06-18 MED ORDER — LIDOCAINE-EPINEPHRINE 1 %-1:100000 IJ SOLN
8.0000 mL | Freq: Once | INTRAMUSCULAR | Status: AC
  Administered 2024-06-18: 8 mL
  Filled 2024-06-18: qty 8

## 2024-06-18 MED ORDER — LIDOCAINE HCL 1 % IJ SOLN
2.0000 mL | Freq: Once | INTRAMUSCULAR | Status: AC
  Administered 2024-06-18: 2 mL
  Filled 2024-06-18: qty 2

## 2024-06-18 NOTE — Progress Notes (Signed)
 Patient for US  guided core LT & RT inguinal LN biopsies on Wed 06/19/24, I called and LVM for the patient on the phone and gave pre-procedure instructions. VM made the patient aware to be here at 12:30p and check in at the [facility] registration desk. Called 06/18/24

## 2024-06-18 NOTE — Progress Notes (Signed)
 Port placement scheduled 06/27/24

## 2024-06-19 ENCOUNTER — Encounter: Payer: Self-pay | Admitting: Oncology

## 2024-06-19 ENCOUNTER — Encounter: Payer: Self-pay | Admitting: Radiation Oncology

## 2024-06-19 ENCOUNTER — Inpatient Hospital Stay: Attending: Oncology

## 2024-06-19 ENCOUNTER — Other Ambulatory Visit: Payer: Self-pay

## 2024-06-19 ENCOUNTER — Other Ambulatory Visit: Payer: Self-pay | Admitting: *Deleted

## 2024-06-19 ENCOUNTER — Ambulatory Visit
Admission: RE | Admit: 2024-06-19 | Discharge: 2024-06-19 | Disposition: A | Discharge status: Home / Self Care | Source: Ambulatory Visit | Attending: Radiation Oncology | Admitting: Radiation Oncology

## 2024-06-19 ENCOUNTER — Ambulatory Visit
Admission: RE | Admit: 2024-06-19 | Discharge: 2024-06-19 | Disposition: A | Discharge status: Home / Self Care | Source: Ambulatory Visit | Attending: Oncology | Admitting: Oncology

## 2024-06-19 VITALS — BP 120/87 | HR 67 | Temp 97.2°F | Resp 16 | Wt 194.0 lb

## 2024-06-19 DIAGNOSIS — C50911 Malignant neoplasm of unspecified site of right female breast: Secondary | ICD-10-CM | POA: Insufficient documentation

## 2024-06-19 DIAGNOSIS — Z79899 Other long term (current) drug therapy: Secondary | ICD-10-CM | POA: Diagnosis not present

## 2024-06-19 DIAGNOSIS — C210 Malignant neoplasm of anus, unspecified: Secondary | ICD-10-CM

## 2024-06-19 DIAGNOSIS — Z791 Long term (current) use of non-steroidal anti-inflammatories (NSAID): Secondary | ICD-10-CM | POA: Insufficient documentation

## 2024-06-19 DIAGNOSIS — R011 Cardiac murmur, unspecified: Secondary | ICD-10-CM | POA: Insufficient documentation

## 2024-06-19 DIAGNOSIS — M3210 Systemic lupus erythematosus, organ or system involvement unspecified: Secondary | ICD-10-CM | POA: Insufficient documentation

## 2024-06-19 DIAGNOSIS — D1803 Hemangioma of intra-abdominal structures: Secondary | ICD-10-CM | POA: Insufficient documentation

## 2024-06-19 DIAGNOSIS — K760 Fatty (change of) liver, not elsewhere classified: Secondary | ICD-10-CM | POA: Insufficient documentation

## 2024-06-19 DIAGNOSIS — M797 Fibromyalgia: Secondary | ICD-10-CM | POA: Insufficient documentation

## 2024-06-19 DIAGNOSIS — K449 Diaphragmatic hernia without obstruction or gangrene: Secondary | ICD-10-CM | POA: Insufficient documentation

## 2024-06-19 DIAGNOSIS — Z1721 Progesterone receptor positive status: Secondary | ICD-10-CM | POA: Insufficient documentation

## 2024-06-19 DIAGNOSIS — Z923 Personal history of irradiation: Secondary | ICD-10-CM | POA: Diagnosis not present

## 2024-06-19 DIAGNOSIS — R590 Localized enlarged lymph nodes: Secondary | ICD-10-CM | POA: Diagnosis not present

## 2024-06-19 DIAGNOSIS — M329 Systemic lupus erythematosus, unspecified: Secondary | ICD-10-CM | POA: Insufficient documentation

## 2024-06-19 DIAGNOSIS — K219 Gastro-esophageal reflux disease without esophagitis: Secondary | ICD-10-CM | POA: Insufficient documentation

## 2024-06-19 DIAGNOSIS — Z9221 Personal history of antineoplastic chemotherapy: Secondary | ICD-10-CM | POA: Diagnosis not present

## 2024-06-19 DIAGNOSIS — C50811 Malignant neoplasm of overlapping sites of right female breast: Secondary | ICD-10-CM | POA: Diagnosis not present

## 2024-06-19 DIAGNOSIS — C50411 Malignant neoplasm of upper-outer quadrant of right female breast: Secondary | ICD-10-CM | POA: Diagnosis not present

## 2024-06-19 DIAGNOSIS — Z1732 Human epidermal growth factor receptor 2 negative status: Secondary | ICD-10-CM | POA: Insufficient documentation

## 2024-06-19 DIAGNOSIS — C775 Secondary and unspecified malignant neoplasm of intrapelvic lymph nodes: Secondary | ICD-10-CM | POA: Insufficient documentation

## 2024-06-19 DIAGNOSIS — B372 Candidiasis of skin and nail: Secondary | ICD-10-CM | POA: Insufficient documentation

## 2024-06-19 DIAGNOSIS — Z171 Estrogen receptor negative status [ER-]: Secondary | ICD-10-CM | POA: Insufficient documentation

## 2024-06-19 DIAGNOSIS — R9389 Abnormal findings on diagnostic imaging of other specified body structures: Secondary | ICD-10-CM

## 2024-06-19 DIAGNOSIS — Z5986 Financial insecurity: Secondary | ICD-10-CM | POA: Insufficient documentation

## 2024-06-19 LAB — SURGICAL PATHOLOGY

## 2024-06-19 MED ORDER — LIDOCAINE HCL (PF) 1 % IJ SOLN
10.0000 mL | Freq: Once | INTRAMUSCULAR | Status: AC
  Administered 2024-06-19: 10 mL via INTRADERMAL

## 2024-06-19 NOTE — Consult Note (Signed)
 NEW PATIENT EVALUATION  Name: Monica Gonzalez  MRN: [id number]  Date:   06/19/2024     DOB: 11-12-1976   This 48 y.o. 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   PREVIOUS INVESTIGATIONS:  CT scan reviewed Clinical notes reviewed Pathology report reviewed  HPI: Patient is a 48 year old female who presented with a several month history of swelling and pain in her anal region.  She was seen by surgery and underwent an anal biopsy which was positive for invasive moderately differentiated squamous cell carcinoma.  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  CT scan of chest abdomen pelvis showed thickening area of the anal canal and pathologically enlarged left inguinal node.  She had borderline right sided inguinal adenopathy.  Of note there was a right sided upper breast mass noted and she is undergoing today biopsy of her inguinal nodes as well as referral to surgeon for evaluation of her breast mass.  She did have ultrasound-guided biopsy yesterday of her right breast mass showing invasive ductal carcinoma overall grade 3.  She is seen today for radiation oncology evaluation.  She still somewhat sore from her breast biopsy.  Her bowel movements tend towards diarrhea she states for the past month they are fairly watery.  No increased lower urinary tract symptoms.  PLANNED TREATMENT REGIMEN: Concurrent chemoradiation therapy for anal cancer  PAST MEDICAL HISTORY:  has a past medical history of Anemia, Asthma, Cancer (HCC), Fibromyalgia, GERD (gastroesophageal reflux disease), Heart murmur, History of hiatal hernia, and Lupus.    PAST SURGICAL HISTORY:  Past Surgical History:  Procedure Laterality  Date   BREAST BIOPSY Right 06/18/2024   US  RT BREAST BX W LOC DEV 1ST LESION IMG BX SPEC US  GUIDE 06/18/2024 [facility]   BREAST BIOPSY Right 06/18/2024   US  RT BREAST BX W LOC DEV EA ADD LESION IMG BX SPEC US  GUIDE 06/18/2024 [facility]   BREAST BIOPSY Right 06/18/2024   US  RT BREAST BX W LOC DEV EA ADD LESION IMG BX SPEC US  GUIDE 06/18/2024 [facility]   COLONOSCOPY WITH PROPOFOL  N/A 02/17/2023   Procedure: COLONOSCOPY WITH PROPOFOL ;  Surgeon: Maryruth Ole DASEN, MD;  Location: [facility];  Service: Endoscopy;  Laterality: N/A;   ESOPHAGOGASTRODUODENOSCOPY (EGD) WITH PROPOFOL  N/A 02/17/2023   Procedure: ESOPHAGOGASTRODUODENOSCOPY (EGD) WITH PROPOFOL ;  Surgeon: Maryruth Ole DASEN, MD;  Location: [facility];  Service: Endoscopy;  Laterality: N/A;   RECTAL EXAM UNDER ANESTHESIA N/A 05/16/2024   Procedure: EXAM UNDER ANESTHESIA, RECTUM, INCISIONAL BIOPSY OF ANAL MASS;  Surgeon: Tye Millet, DO;  Location: [facility];  Service: General;  Laterality: N/A;   TONSILLECTOMY     TUBAL LIGATION     VULVECTOMY N/A 07/29/2020   Procedure: WIDE EXCISION VULVECTOMY;  Surgeon: Mancil Barter, MD;  Location: [facility];  Service: Gynecology;  Laterality: N/A;    FAMILY HISTORY: family history includes Congestive Heart Failure in her mother; Diabetes in her mother; Lupus in her maternal grandmother.  SOCIAL HISTORY:  reports that she has never smoked. She has never used smokeless tobacco. She reports that she does not currently use alcohol. She reports that she does not use drugs.  ALLERGIES: Prednisone  MEDICATIONS:  Current Outpatient Medications  Medication Sig  Dispense Refill   albuterol  (PROVENTIL ) (2.5 MG/3ML) 0.083% nebulizer solution Take 2.5 mg by nebulization every 6 (six) hours as needed for shortness of breath or wheezing.     albuterol  (VENTOLIN  HFA) 108 (90 Base) MCG/ACT inhaler Inhale 2 puffs into the lungs every 6 (six) hours as needed for wheezing or shortness of breath. 8 g 2    busPIRone (BUSPAR) 5 MG tablet Take 5 mg by mouth 2 (two) times daily.     celecoxib (CELEBREX) 200 MG capsule Take 200 mg by mouth 2 (two) times daily.     clonazePAM (KLONOPIN) 0.5 MG tablet Take 0.5 mg by mouth at bedtime as needed (anxiety/sleep).     DULoxetine (CYMBALTA) 30 MG capsule Take 30 mg by mouth at bedtime.     ferrous sulfate 325 (65 FE) MG tablet Take 325 mg by mouth daily.      gabapentin (NEURONTIN) 300 MG capsule Take 600 mg by mouth 2 (two) times daily.     ibuprofen  (ADVIL ) 800 MG tablet Take 800 mg by mouth every 8 (eight) hours as needed.     lidocaine  (LIDODERM ) 5 % 1 patch daily.     naproxen  (EC NAPROSYN ) 500 MG EC tablet Take 1 tablet (500 mg total) by mouth 2 (two) times daily with a meal. 60 tablet 0   nystatin  (MYCOSTATIN /NYSTOP ) powder Apply 1 Application topically 2 (two) times daily. 20 g 0   pantoprazole (PROTONIX) 40 MG tablet Take by mouth.     SUMAtriptan (IMITREX) 50 MG tablet Take 50 mg by mouth every 2 (two) hours as needed for migraine.     traMADol  (ULTRAM ) 50 MG tablet Take 1 tablet (50 mg total) by mouth every 8 (eight) hours as needed. 6 tablet 0   traZODone (DESYREL) 50 MG tablet Take 50 mg by mouth at bedtime.     No current facility-administered medications for this encounter.    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 Patient denies any weight loss, fatigue, weakness, fever, chills or night sweats. Patient denies any loss of vision, blurred vision. Patient denies any ringing  of the ears or hearing loss. No irregular heartbeat. Patient denies heart murmur or history of fainting. Patient denies any chest pain or pain radiating to her upper extremities. Patient denies any shortness of breath, difficulty breathing at night, cough or hemoptysis. Patient denies any swelling in the lower legs.  Patient denies any nausea vomiting, vomiting of blood, or coffee ground material in the vomitus. Patient denies any stomach pain. Patient states has had normal bowel movements no significant constipation or diarrhea. Patient denies any dysuria, hematuria or significant nocturia. Patient denies any problems walking, swelling in the joints or loss of balance. Patient denies any skin changes, loss of hair or loss of weight. Patient denies any excessive worrying or anxiety or significant depression. Patient denies any problems with insomnia. Patient denies excessive thirst, polyuria, polydipsia. Patient denies any swollen glands, patient denies easy bruising or easy bleeding. Patient denies any recent infections, allergies or URI. Patient s visual fields have not changed significantly in recent time.   PHYSICAL EXAM: BP 120/87   Pulse 67   Temp (!) 97.2 F (36.2 C)   Resp 16   Wt 194 lb (88 kg)   LMP 05/29/2024 (Approximate)   BMI 39.18 kg/m  Patient has a firm  exophytic mass in the anal region consistent with known anal carcinoma.  She does have some shotty adenopathy in the left inguinal region.  I do palpate on breast exam small area of firmness consistent with breast cancer.  No other dominant breast masses are noted no axillary or supraclavicular adenopathy is appreciated.  Well-developed well-nourished patient in NAD. HEENT reveals PERLA, EOMI, discs not visualized.  Oral cavity is clear. No oral mucosal lesions are identified. Neck is clear without evidence of cervical or supraclavicular adenopathy. Lungs are clear to A&P. Cardiac examination is essentially unremarkable with regular rate and rhythm without murmur rub or thrill. Abdomen is benign with no organomegaly or masses noted. Motor sensory and DTR levels are equal and symmetric in the upper and lower extremities. Cranial nerves II through XII are grossly intact. Proprioception is intact. No peripheral adenopathy or edema is identified. No  motor or sensory levels are noted. Crude visual fields are within normal range.  LABORATORY DATA: Pathology reports reviewed    RADIOLOGY RESULTS: CT scan of chest abdomen pelvis reviewed PET CT scan ordered   IMPRESSION: Probably locally advanced squamous cell carcinoma the anus as well as right sided breast cancer and 48 year old female  PLAN: At this time I have ordered a PET CT scan to help with further staging of her disease as well as identification of possible adenopathy for treatment planning purposes.  She also is currently being referred for surgery possible lumpectomy and postoperative radiation therapy for her right sided breast cancer.  Risks and benefits of treatment for anal cancer were reviewed with the patient increased diarrhea possible increased lower Neri tract symptoms skin reaction fatigue alteration of blood counts all reviewed in detail with the patient.  Once I have the PET CT scan we will schedule her for CT simulation.  Also pending are her biopsies of her inguinal nodes which again will help me decide on my treatment fields and dosing strategy for her anal cancer.  Patient comprehends my recommendations well.  I would like to take this opportunity to thank you for allowing me to participate in the care of your patient.SABRA Marcey Penton, MD

## 2024-06-19 NOTE — Procedures (Signed)
 Interventional Radiology Procedure Note  Procedure: US  CORE BX LEFT INGUINAL ADENOPATHY    Complications: None  Estimated Blood Loss:  MIN  Findings: 28 G CORES IN FORMALIN    M. TREVOR Olumide Dolinger, MD

## 2024-06-20 ENCOUNTER — Other Ambulatory Visit: Payer: Self-pay

## 2024-06-20 ENCOUNTER — Encounter: Payer: Self-pay | Admitting: *Deleted

## 2024-06-20 ENCOUNTER — Telehealth: Payer: Self-pay

## 2024-06-20 LAB — SURGICAL PATHOLOGY

## 2024-06-20 NOTE — Telephone Encounter (Signed)
 FMLA completed and faxed (please see 06/11/24 phone note for reference) for [facility].

## 2024-06-20 NOTE — Progress Notes (Signed)
 Received referral for newly diagnosed breast cancer from [facility].  Navigation initiated.  She is already established with Dr. Babara and will see her on 8/13 to discuss the new breast cancer diagnosis.  She will also see Dr. Tye on Monday 8/11 to discuss her surgical options.

## 2024-06-21 ENCOUNTER — Encounter: Payer: Self-pay | Admitting: Urgent Care

## 2024-06-24 ENCOUNTER — Other Ambulatory Visit: Payer: Self-pay | Admitting: *Deleted

## 2024-06-24 ENCOUNTER — Inpatient Hospital Stay
Admission: RE | Admit: 2024-06-24 | Discharge: 2024-06-24 | Disposition: A | Discharge status: Home / Self Care | Source: Ambulatory Visit

## 2024-06-24 NOTE — Pre-Procedure Instructions (Signed)
 Per Dr Tye port placement surgery scheduled for 06/27/24 postponed. No new date at this time.

## 2024-06-24 NOTE — Progress Notes (Signed)
 Subjective:   CC: Malignant neoplasm of upper-outer quadrant of right female breast, unspecified estrogen receptor status (CMS/HHS-HCC) [C50.411] HPI:  referred by [name], MD for evaluation of above. Change was noted on CT for anal CA mets workup.  Subsequent mammo bx results noted below.  Pt has not noticed the lump before.  Never had mammo recommended to her either.  History of Present Illness  G5P3.  Menarch around 10.  First born at 80, did not breastfeed.  LMP. No FH of breast related issues   Past Medical History:  has a past medical history of Acute stomach ulcer, Anemia, Anxiety, Asthma (HHS-HCC), Bronchitis, chronic (CMS/HHS-HCC), GERD (gastroesophageal reflux disease), Heart murmur, Hiatal hernia, History of cancer, History of pneumonia, Kidney stones, Lupus, and Vulvar intraepithelial neoplasia (VIN) grade 3.  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  Family History: family history includes Asthma in her mother; Atrial fibrillation (Abnormal heart rhythm sometimes requiring treatment with blood thinners) in her maternal grandmother; Autoimmune disease in her maternal grandmother; COPD in her mother; Cancer in her paternal grandfather; Clotting disorder in her maternal grandmother; Coronary Artery Disease (Blocked arteries around heart) in her father and mother; Diabetes type II in her mother; Heart failure in her maternal grandmother; High blood pressure (Hypertension) in her maternal grandmother; Kidney failure in her mother; Lupus in her maternal grandmother; Myocardial Infarction (Heart attack) in her mother; No Known Problems in her half-brother and half-sister; Prostate cancer in her maternal grandfather.  Social History:  reports that she quit smoking about  22 years ago. Her smoking use included cigarettes. She has never used smokeless tobacco. She reports current alcohol use. She reports that she does not use drugs.  Current Medications: has a current medication list which includes the following prescription(s): buspirone, calcium carbonate, celecoxib, clonazepam, cyclobenzaprine , duloxetine, hydrocodone -acetaminophen , ipratropium-albuterol , lidocaine , linaclotide, mag hydrox/aluminum hyd/simeth, polyethylene glycol, promethazine -dextromethorphan, sumatriptan, tranexamic acid, trazodone, albuterol , albuterol  mdi (proventil , ventolin , proair ) hfa, and gabapentin.  Allergies:  Allergies as of 06/24/2024 - Reviewed 06/24/2024  Allergen Reaction Noted  . Prednisone Swelling and Anaphylaxis 05/03/2015    ROS:  A 15 point review of systems was performed and was negative except as noted in HPI   Objective:     BP 109/75   Pulse 62   Ht 149.9 cm (4' 11)   Wt 88 kg (194 lb)   BMI 39.18 kg/m   Constitutional :  No distress, cooperative, alert  Lymphatics/Throat:  Supple with no lymphadenopathy  Respiratory:  Clear to auscultation bilaterally  Cardiovascular:  Regular rate and rhythm  Gastrointestinal: Soft, non-tender, non-distended, no organomegaly.  Musculoskeletal: Steady gait and movement  Skin: Cool and moist  Psychiatric: Normal affect, non-agitated, not confused  Breast: Normal appearance and no palpable abnormality in left breast and axilla.  Post bx changes noted in right with bruising, subtly palpable lump in area of concern. No right axillary lymphadenopathy. Chaperone present for exam.      LABS:  SURGICAL PATHOLOGY SURGICAL PATHOLOGY [facility] [address] [place], [place] [address] Telephone [phone] or [phone] Fax [phone]  REPORT OF SURGICAL PATHOLOGY   Accession #: [id number] Patient Name: Monica Gonzalez, Monica Gonzalez Visit # : [id number]  MRN: [id number] Physician: Vanice Ozell Revel DOB/Age [birth year] (Age: 22) Gender: F Collected Date:  06/19/2024 Received Date: 06/19/2024  FINAL DIAGNOSIS       1. Lymph node, biopsy, left inguinal adenopathy :      -  METASTATIC MODERATELY DIFFERENTIATED SQUAMOUS CELL CARCINOMA.      NOTE: DR. SHARMA HAS PEER REVIEWED THE CASE AND AGREES WITH THE INTERPRETATION.       DATE SIGNED OUT: 06/20/2024 ELECTRONIC SIGNATURE : Legolvan Do, Mark, Pathologist, Electronic Signature  MICROSCOPIC DESCRIPTION  CASE COMMENTS STAINS USED IN DIAGNOSIS: H&E    CLINICAL HISTORY  SPECIMEN(S) OBTAINED 1. Lymph node, biopsy, Left Inguinal Adenopathy  SPECIMEN COMMENTS: SPECIMEN CLINICAL INFORMATION: 1. MET DZ.  Anal    Gross Description 1. Received in formalin are multiple fragments of pink-tan soft tissue aggregating to 1.9 x 0.1 x 0.1 cm submitted entirely in 1 block. mb 06-19-24        Report signed out from the following location(s) [place].  [facility] [address] [place], [place] [address] CLIA #: [id number]  [facility] [address] [place], [place] [address] CLIA #: [id number]    SURGICAL PATHOLOGY SURGICAL PATHOLOGY [facility] [address] [place], [place] [address] Telephone [phone] or [phone] Fax [phone]  REPORT OF SURGICAL PATHOLOGY   Accession #: [id number] Patient Name: Monica Gonzalez, Monica Gonzalez Visit # : [id number]  MRN: [id number] Physician: Correne Krabbe DOB/Age [birth year] (Age: 35) Gender: F Collected Date: 06/18/2024 Received Date: 06/18/2024  FINAL DIAGNOSIS       1. Breast, right, needle core biopsy, 12:00 12cmfn (heart clip) :       INVASIVE DUCTAL CARCINOMA      TUBULE FORMATION: SCORE 3      NUCLEAR PLEOMORPHISM: SCORE 3      MITOTIC COUNT: SCORE 2      TOTAL SCORE: 8      OVERALL GRADE: 3      LYMPHOVASCULAR INVASION: NOT IDENTIFIED      CANCER LENGTH: 1.0 CM      CALCIFICATIONS: NOT IDENTIFIED       OTHER FINDINGS: NONE      SEE NOTE       2. Breast, right, needle core biopsy, 12:00 8cmfn (coil clip) :      INVASIVE DUCTAL CARCINOMA      DUCTAL CARCINOMA IN SITU, SOLID, HIGH NUCLEAR GRADE WITH NECROSIS      TUBULE FORMATION: SCORE 3      NUCLEAR PLEOMORPHISM: SCORE 3      MITOTIC COUNT: SCORE 2      TOTAL SCORE: 8      OVERALL GRADE: 3      LYMPHOVASCULAR INVASION: NOT IDENTIFIED      CANCER LENGTH: 0.3 CM      CALCIFICATIONS: NOT IDENTIFIED      OTHER FINDINGS: NONE      SEE NOTE       3. Breast, right, needle core biopsy, 9:00 12cmfn (venus) :      BENIGN BREAST TISSUE WITH DENSE STROMAL FIBROSIS.      NEGATIVE FOR ATYPIA OR MALIGNANCY.       Diagnosis Note :      DR. Wolford REVIEWED THE CASE AND CONCURS WITH THE INTERPRETATION.  A BREAST      PROGNOSTIC PROFILE (ER AND PR) IS PENDING AND WILL BE REPORTED IN AN ADDENDUM.      Rock Hover WAS NOTIFIED ON 06/19/2024.      ELECTRONIC SIGNATURE : Pepper Dutton Md, Pathologist, Electronic Signature  MICROSCOPIC DESCRIPTION  CASE COMMENTS  STAINS USED IN DIAGNOSIS: H&E-2 H&E-3 H&E-4 H&E *RECUT 1 SLIDE H&E-2 H&E-3 H&E-4 H&E H&E-2 H&E-3 H&E-4 H&E Stains used in diagnosis 1 Her2 by IHC, 1 ER-ACIS, 1 KI-67-ACIS, 1 PR-ACIS IHC scores are reported using ASCO/CAP scoring criteria.  An IHC Score of 0 or 1+  is NEGATIVE for HER2, 3+ is POSITIVE for HER2, and 2+ is EQUIVOCAL. Equivocal results are reflexed to either FISH or IHC testing. Specimens are fixed in 10% Neutral Buffered Formalin for at least 6 hours and up to 72 hours. These tests have not be validated on decalcified tissue.  Results should be interpreted with caution given the possibility of false negative results on decalcified specimens. Antibody Clone for HER2 is 4B5 (PATHWAY). Some of these immunohistochemical stains may have been developed and the performance characteristics determined by [facility].  Some may not have been cleared or  approved by the U.S. Food and Drug Administration.  The FDA has determined that such clearance or approval is not necessary.  This test is used for clinical purposes.  It should not be regarded as investigational or for research.  This laboratory is certified under the Clinical Laboratory Improvement Amendments of 1988 (CLIA-88) as qualified to perform high complexity clinical laboratory testing. Estrogen receptor (6F11), immunohistochemical stains are performed on formalin fixed, paraffin embedded tissue using a 3,3-diaminobenzidine (DAB) chromogen and Leica Bond Autostainer System.  The staining intensity of the nucleus is scored manually and is reported as the percentage of tumor cell nuclei demonstrating specific nuclear staining.Specimens are fixed in 10% Neutral Buffered Formalin for at least 6 hours and up to 72 hours.  These tests have not be validated on decalcified tissue.  Results should be interpreted with caution given the possibility of false negative results on decalcified specimens. Ki-67 (MM1), immunohistochemical stains are performed on formalin fixed, paraffin embedded tissue using a 3,3-diaminobenzidine (DAB) chromogen and Leica Bond Autostainer System.  The staining intensity of the nucleus is scored manually and is reported as the percentage of tumor cell nuclei demonstrating specific nuclear staining.Specimens are fixed in 10% Neutral Buffered Formalin for at least 6 hours and up to 72 hours. These tests have not be validated on decalcified tissue.  Results should be interpreted with caution given the possibility of false negative results on decalcified specimens. PR progesterone receptor (16), immunohistochemical stains are performed on formalin fixed, paraffin embedded tissue using a 3,3-diaminobenzidine (DAB) chromogen and Leica Bond Autostainer System.  The staining intensity of the nucleus is scored manually and is reported as the percentage of tumor  cell nuclei demonstrating specific nuclear staining.Specimens are fixed in 10% Neutral Buffered Formalin for at least 6 hours and up to 72 hours. These tests have not be validated on decalcified tissue.  Results should be interpreted with caution given the possibility of false negative results on decalcified specimens.  ADDENDUM 1A) Breast, right, needle core biopsy, 12:00 12cmfn (heart clip) PROGNOSTIC INDICATORS  Results: IMMUNOHISTOCHEMICAL AND MORPHOMETRIC ANALYSIS PERFORMED MANUALLY 240The tumor cells are NEGATIVE for Her2 (0). Estrogen Receptor:  0%, NEGATIVE Progesterone Receptor:  0%, NEGATIVE Proliferation Marker Ki67:  95% COMMENT:  The negative hormone receptor study(ies) in this case has no internal positive control.  REFERENCE RANGE ESTROGEN RECEPTOR NEGATIVE     0% POSITIVE       =>1% REFERENCE RANGE PROGESTERONE RECEPTOR NEGATIVE     0% POSITIVE        =>1% All controls stained appropriately Belvie Rolla Rush, Pathologist, Electronic Signature ( Signed [phone])  CLINICAL HISTORY  SPECIMEN(S) OBTAINED 1. Breast, right, needle core biopsy, 12:00 12cmfn (heart Clip) 2. Breast, right, needle core biopsy, 12:00 8cmfn (coil Clip) 3. Breast, right, needle core biopsy, 9:00 12cmfn (venus)  SPECIMEN COMMENTS: 1. TIF: 14:18pm, CIT: less than 30 sec 2. TIF: 14:21pm, CIT: less than 30 sec 3. TIF: 14:37pm, CIT: Less than 30 sec SPECIMEN CLINICAL INFORMATION: 1. Suspicioius right breast mass with calcs (site 1) sonographically indentified areas in right breast (site2 and 3)    Gross Description 1. Received in formalin labeled with the patient's name right breast 12 o'clock, 12 cm from nipple, and consists of multiple core and core fragments of tan yellow fibroadipose tissue, ranging from 0.2 cm to 1.5 x 0.2 x 0.2 cm.The specimen is entirely submitted in one cassette.      TIF 2:18 p.m. on 06/18/24. CIT less than 30 seconds 2. Received in formalin labeled with  the patient's name and right breast 12 o'clock, 8 cm from nipple and consists of multiple core and core fragments of tan pink fibroadipose tissue, ranging from 0.1 cm to 1.2 x 0.2 x 0.2 cm.The specimen is entirely submitted in one cassette.      TIF 2:21 p.m. on 06/18/24.  CIT less than 30 seconds 3. Received in formalin labeled with the patient's name and right breast 9 o'clock, 12 cm from nipple and consists of multiple core and core fragment of tan yellow fibroadipose tissue, ranging from 0.1 cm to 1.3 x 0.2 x 0.2 cm.The specimen is entirely submitted in one cassette.      TIF 2:37 p.m. on 06/18/24.  CIT less than 30 seconds.  (KL:gt, 06/19/24)        Report signed out from the following location(s) [place]. [facility] [address] [place], [place] [address] CLIA #: [id number]  [facility] [address] [place], [place] [address] CLIA #: [id number]    RADS: ADDENDUM REPORT: 06/19/2024 14:32   ADDENDUM: PATHOLOGY revealed: Site 1. 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   Pathology results are CONCORDANT with imaging findings, per Corean Salter M.D.   PATHOLOGY revealed: Site 2. Breast, right, needle core biopsy, 12:00; 8 cmfn (coil clip) : INVASIVE DUCTAL CARCINOMA - DUCTAL CARCINOMA IN SITU, SOLID, HIGH NUCLEAR GRADE WITH NECROSIS. OVERALL GRADE: 3 -   LYMPHOVASCULAR INVASION: NOT IDENTIFIED. CANCER LENGTH: 0.3 CM. CALCIFICATIONS: NOT IDENTIFIED   Pathology results are CONCORDANT with imaging findings, per Corean Salter M.D.   PATHOLOGY revealed: Site 3. Breast, right, needle core biopsy, 9:00; 12 cmfn (venus) : BENIGN BREAST TISSUE WITH DENSE STROMAL FIBROSIS. NEGATIVE FOR ATYPIA OR MALIGNANCY.   Pathology results are CONCORDANT with imaging findings,  per Corean Salter M.D.   Pathology results and recommendations below were discussed with patient by telephone on 06/19/2024 by Rock Hover RN. Patient reported biopsy site within normal limits with slight tenderness at the site. Post biopsy care instructions were reviewed, questions were answered and my direct phone number was provided to patient. Patient was instructed to call [facility] if any concerns or questions arise related to the biopsy.   RECOMMENDATIONS: 1. Surgical and oncological consultation. Request for surgical and oncological consultation relayed to Shasta Ada RN at [facility] by Rock Hover RN on 06/19/2024.   2. Recommend pretreatment bilateral breast MRI with and without contrast  to assess extent of breast disease, given the patient's breast density and age.   3. The 2 sites of biopsy-proven malignancy (COIL and heart clip) are approximately 4.7 cm apart.   Pathology results reported by Rock Hover RN on 06/19/2024.     Electronically Signed   By: Corean Salter M.D.   On: 06/19/2024 14:32  CLINICAL DATA:  Patient with recently diagnosed anal cancer. On staging examination, a mass was noted in the RIGHT upper breast.   EXAM: DIGITAL DIAGNOSTIC BILATERAL MAMMOGRAM WITH TOMOSYNTHESIS AND CAD; ULTRASOUND RIGHT BREAST LIMITED   TECHNIQUE: Bilateral digital diagnostic mammography and breast tomosynthesis was performed. The images were evaluated with computer-aided detection. ; Targeted ultrasound examination of the right breast was performed   COMPARISON:  None available.   ACR Breast Density Category c: The breasts are heterogeneously dense, which may obscure small masses.   FINDINGS: Diagnostic images of the RIGHT breast demonstrate an irregular mass with associated pleomorphic calcifications in the upper breast at middle depth. Mass is estimated to span 17 mm. Associated calcifications are estimated to span  approximately 17 mm.   A superficial oval mass noted in the RIGHT upper outer breast on true lateral slice 57 with possible internal fat density. No suspicious mass, distortion, or microcalcifications are identified to suggest presence of malignancy in the LEFT breast.   On physical exam, there is a hard mass in the upper breast.   Targeted ultrasound was performed of the upper breast. There is an irregular hypoechoic mass at 12 o'clock 12 cm from the nipple with indistinct margins. It demonstrates multiple associated echogenic foci and posterior acoustic shadowing. It spans approximately 15 x 11 x 12 mm and corresponds to the site of mammographic concern.   Targeted ultrasound was performed of the RIGHT axilla. No suspicious axillary lymph nodes are visualized.   Targeted ultrasound was performed of the RIGHT upper outer breast. There is incidental sonographic note of an oval hypoechoic mass with relatively circumscribed margins at 12 o'clock 8 cm from the nipple. This measures 7 x 4 by 6 mm. It is located approximately 3.5 cm from the margin of the dominant mass.   There is incidental sonographic note of a hypoechoic non mass area at 9 o'clock 12 cm from the nipple. This measures approximately 6 x 3 x 5 mm.   At 10 o'clock 10 cm from the nipple, there is a superficial oval anechoic mass with posterior acoustic shadowing and suggestion of a mild adjacent echogenic rind. This measures 3 by 4 x 3 mm. BB was placed at the site and repeat spot tangential imaging was performed. This demonstrates a small amount of internal fat density (spot tangential slice 34) at this site. This corresponds to the superficial mass noted mammographically and is favored to reflect benign fat necrosis.   IMPRESSION: 1. There is a suspicious 17 mm mass at the site of CT and mammographic concern in the RIGHT breast. It demonstrates associated pleomorphic calcifications. Recommend ultrasound-guided  biopsy for definitive characterization. 2. There is incidental sonographic note of a 7 mm mass in the RIGHT breast at 12 o'clock and a 6 mm non mass area at 9 o'clock. Recommend ultrasound-guided biopsy of these 2 areas for definitive characterization given suspicious appearance of the dominant mass. 3. No suspicious RIGHT axillary adenopathy. 4. No mammographic evidence of malignancy in the LEFT breast.   RECOMMENDATION: RIGHT breast ultrasound-guided biopsy x3   I have discussed the findings and recommendations with the patient. The biopsy  procedure was discussed with the patient and questions were answered. Patient expressed their understanding of the biopsy recommendation. Patient will be scheduled for biopsy at her earliest convenience by the schedulers. Ordering provider will be notified. If applicable, a reminder letter will be sent to the patient regarding the next appointment.   BI-RADS CATEGORY  5: Highly suggestive of malignancy.     Electronically Signed   By: Corean Salter M.D.   On: 06/17/2024 13:52  CLINICAL DATA:  Anal squamous cell carcinoma.  * Tracking Code: BO *   EXAM: CT CHEST, ABDOMEN, AND PELVIS WITH CONTRAST   TECHNIQUE: Multidetector CT imaging of the chest, abdomen and pelvis was performed following the standard protocol during bolus administration of intravenous contrast.   RADIATION DOSE REDUCTION: This exam was performed according to the departmental dose-optimization program which includes automated exposure control, adjustment of the mA and/or kV according to patient size and/or use of iterative reconstruction technique.   CONTRAST:  OMNIPAQUE  IOHEXOL  300 MG/ML  SOLN   COMPARISON:  CTA chest 11/27/2022. Abdominal ultrasound limited 08/02/2023. MRI abdomen 11/29/2022.   FINDINGS: CT CHEST FINDINGS   Cardiovascular: No significant vascular findings. Normal heart size. No pericardial effusion.   Mediastinum/Nodes: No  enlarged mediastinal, hilar, or axillary lymph nodes. Thyroid  gland, trachea, and esophagus demonstrate no significant findings.   Lungs/Pleura: No dominant lung nodule. Lungs are clear. No pleural effusion or pneumothorax.   Musculoskeletal: Mild scattered degenerative changes identified. Of note there is a nodular focus along the upper aspect of the right breast on series 2, image 13 measuring 14 mm. This was not clearly seen on previous CT scan. Please correlate with dedicated mammographic evaluation and possible ultrasound.   CT ABDOMEN PELVIS FINDINGS   Hepatobiliary: Fatty liver infiltration. There is a low-attenuation lesion identified posteriorly in segment 7. On prior MRI this was felt to be a hemangioma measured at that time 2.2 x 1.7 cm. Today 2.4 by 1.5 cm, not significantly changed. No other space-occupying liver lesion. Gallbladder is present. Patent portal vein.   Pancreas: Unremarkable. No pancreatic ductal dilatation or surrounding inflammatory changes.   Spleen: Normal in size without focal abnormality.   Adrenals/Urinary Tract: Adrenal glands are unremarkable. Kidneys are normal, without renal calculi, focal lesion, or hydronephrosis. Bladder is unremarkable.   Stomach/Bowel: Slight nodular thickening in the area of the anal region. Please correlate with history of neoplasm. Otherwise large bowel has a normal course and caliber with scattered stool. Oral contrast was administered. Normal appendix. The stomach and small bowel are nondilated.   Vascular/Lymphatic: Normal caliber aorta and IVC.   Abnormal lymph node identified in the left inguinal region. Lesion measures 19 by 17 mm on series 2, image 113. Other nodes in this location are small. There is a prominent right inguinal node on series 2, image 113 as well measuring 14 by 9 mm. Borderline size. No abnormal nodal enlargement otherwise along the retroperitoneum, pelvis or elsewhere.   Reproductive:  Left-sided ovarian cystic lesion identified measuring 2.6 cm. Uterus is present.   Other: No free air or free fluid.   Musculoskeletal: Mild curvature of the spine. Scattered degenerative changes.   IMPRESSION: Slight thickening in the area of the anal canal. Please correlate for history of neoplasm. There is a pathologic enlarged left inguinal node. There is a borderline right-sided node.   No additional areas of abnormal nodal enlargement or other aggressive appearing mass lesion at this time.   Fatty liver infiltration with known segment 7  hepatic hemangioma.   Of note there is a right-sided upper breast mass which was not clearly seen on prior CT scan. Please correlate for any prior study or if needed diagnostic mammographic evaluation and possible ultrasound when appropriate.     Electronically Signed   By: Ranell Bring M.D.   On: 06/12/2024 10:49     Assessment:   Malignant neoplasm of upper-outer quadrant of right female breast, unspecified estrogen receptor status (CMS/HHS-HCC) [C50.411]  Plan:     1. Malignant neoplasm of upper-outer quadrant of right female breast, unspecified estrogen receptor status (CMS/HHS-HCC) [C50.411]  Discussed the risk of surgery including recurrence, chronic pain, post-op infxn, poor/delayed wound healing, poor cosmesis, seroma, hematoma formation, and possible re-operation to address said risks. The risks of general anesthetic, if used, includes MI, CVA, sudden death or even reaction to anesthetic medications also discussed.  Typical post-op recovery time and possbility of activity restrictions were also discussed.  Alternatives include continued observation.  Benefits include possible symptom relief, pathologic evaluation, and/or curative excision.   The patient verbalized understanding and all questions were answered to the patient's satisfaction.  2. Right breast cancer- will likely proceed with breast lumpectomy and sentinel lymph  node biopsy, assuming additional imaging studies for breast is negative.  Will likely place port at same time of breast surgery, as long as rest of team is ok to delay port placement that was originally scheduled for this week.  Anal cancer- still recommend port placement to start chemotherapy as originally recommended.  Ana or our office will keep you updated with additional recommendations labs/images/medications/previous chart entries reviewed personally and relevant changes/updates noted above.

## 2024-06-24 NOTE — Patient Instructions (Signed)
 Your procedure is scheduled on: Report to the Registration Desk on the 1st floor of the [facility]. To find out your arrival time, please call [phone] between 1PM - 3PM on: If your arrival time is 6:00 am, do not arrive before that time as the [facility] entrance doors do not open until 6:00 am.  REMEMBER: Instructions that are not followed completely may result in serious medical risk, up to and including death; or upon the discretion of your surgeon and anesthesiologist your surgery may need to be rescheduled.  Do not eat food after midnight the night before surgery.  No gum chewing or hard candies.  You may however, drink CLEAR liquids up to 2 hours before you are scheduled to arrive for your surgery. Do not drink anything within 2 hours of your scheduled arrival time.  Clear liquids include: - water   - apple juice without pulp - gatorade (not RED colors) - black coffee or tea (Do NOT add milk or creamers to the coffee or tea) Do NOT drink anything that is not on this list.  **Type 1 and Type 2 diabetics should only drink water .**  In addition, your doctor has ordered for you to drink the provided:  Ensure Pre-Surgery Clear Carbohydrate Drink  Gatorade G2 Drinking this carbohydrate drink up to two hours before surgery helps to reduce insulin resistance and improve patient outcomes. Please complete drinking 2 hours before scheduled arrival time.  One week prior to surgery: Stop Anti-inflammatories (NSAIDS) such as Advil, Aleve, Ibuprofen, Motrin, Naproxen, Naprosyn and Aspirin based products such as Excedrin, Goody's Powder, BC Powder. Stop ANY OVER THE COUNTER supplements until after surgery.  You may however, continue to take Tylenol  if needed for pain up until the day of surgery.  **Follow guidelines for insulin and diabetes medications.**  **Follow recommendations regarding stopping blood thinners.**  Continue taking all of your other prescription medications up  until the day of surgery.  ON THE DAY OF SURGERY ONLY TAKE THESE MEDICATIONS WITH SIPS OF WATER :    Use inhalers on the day of surgery and bring to the hospital.  Fleets enema or bowel prep as directed.  No Alcohol for 24 hours before or after surgery.  No Smoking including e-cigarettes for 24 hours before surgery.  No chewable tobacco products for at least 6 hours before surgery.  No nicotine patches on the day of surgery.  Do not use any recreational drugs for at least a week (preferably 2 weeks) before your surgery.  Please be advised that the combination of cocaine and anesthesia may have negative outcomes, up to and including death. If you test positive for cocaine, your surgery will be cancelled.  On the morning of surgery brush your teeth with toothpaste and water , you may rinse your mouth with mouthwash if you wish. Do not swallow any toothpaste or mouthwash.  Use CHG Soap or wipes as directed on instruction sheet.  Do not wear jewelry, make-up, hairpins, clips or nail polish.  For welded (permanent) jewelry: bracelets, anklets, waist bands, etc.  Please have this removed prior to surgery.  If it is not removed, there is a chance that hospital personnel will need to cut it off on the day of surgery.  Do not wear lotions, powders, or perfumes.   Do not shave body hair from the neck down 48 hours before surgery.  Contact lenses, hearing aids and dentures may not be worn into surgery.  Do not bring valuables to the hospital. [facility]  is not responsible for any missing/lost belongings or valuables.   Total Shoulder Arthroplasty:  use Benzoyl Peroxide 5% Gel as directed on instruction sheet.  Bring your C-PAP to the hospital in case you may have to spend the night.   Notify your doctor if there is any change in your medical condition (cold, fever, infection).  Wear comfortable clothing (specific to your surgery type) to the hospital.  After surgery, you can help  prevent lung complications by doing breathing exercises.  Take deep breaths and cough every 1-2 hours. Your doctor may order a device called an Incentive Spirometer to help you take deep breaths. When coughing or sneezing, hold a pillow firmly against your incision with both hands. This is called "splinting." Doing this helps protect your incision. It also decreases belly discomfort.  If you are being admitted to the hospital overnight, leave your suitcase in the car. After surgery it may be brought to your room.  In case of increased patient census, it may be necessary for you, the patient, to continue your postoperative care in the Same Day Surgery department.  If you are being discharged the day of surgery, you will not be allowed to drive home. You will need a responsible individual to drive you home and stay with you for 24 hours after surgery.   If you are taking public transportation, you will need to have a responsible individual with you.  Please call the Pre-admissions Testing Dept. at [phone] if you have any questions about these instructions.  Surgery Visitation Policy:  Patients having surgery or a procedure may have two visitors.  Children under the age of 34 must have an adult with them who is not the patient.  Inpatient Visitation:    Visiting hours are 7 a.m. to 8 p.m. Up to four visitors are allowed at one time in a patient room. The visitors may rotate out with other people during the day.  One visitor age 6 or older may stay with the patient overnight and must be in the room by 8 p.m.   Merchandiser, retail to address health-related social needs:  [email or web address]     Preparing for Surgery with CHLORHEXIDINE GLUCONATE (CHG) Soap  Chlorhexidine Gluconate (CHG) Soap  o An antiseptic cleaner that kills germs and bonds with the skin to continue killing germs even after washing  o Used for showering the night before surgery and  morning of surgery  Before surgery, you can play an important role by reducing the number of germs on your skin.  CHG (Chlorhexidine gluconate) soap is an antiseptic cleanser which kills germs and bonds with the skin to continue killing germs even after washing.  Please do not use if you have an allergy to CHG or antibacterial soaps. If your skin becomes reddened/irritated stop using the CHG.  1. Shower the NIGHT BEFORE SURGERY and the MORNING OF SURGERY with CHG soap.  2. If you choose to wash your hair, wash your hair first as usual with your normal shampoo.  3. After shampooing, rinse your hair and body thoroughly to remove the shampoo.  4. Use CHG as you would any other liquid soap. You can apply CHG directly to the skin and wash gently with a scrungie or a clean washcloth.  5. Apply the CHG soap to your body only from the neck down. Do not use on open wounds or open sores. Avoid contact with your eyes, ears, mouth, and genitals (private parts). Wash face and genitals (private  parts) with your normal soap.  6. Wash thoroughly, paying special attention to the area where your surgery will be performed.  7. Thoroughly rinse your body with warm water .  8. Do not shower/wash with your normal soap after using and rinsing off the CHG soap.  9. Pat yourself dry with a clean towel.  10. Wear clean pajamas to bed the night before surgery.  12. Place clean sheets on your bed the night of your first shower and do not sleep with pets.  13. Shower again with the CHG soap on the day of surgery prior to arriving at the hospital.  14. Do not apply any deodorants/lotions/powders.  15. Please wear clean clothes to the hospital.

## 2024-06-26 ENCOUNTER — Encounter: Payer: Self-pay | Admitting: *Deleted

## 2024-06-26 ENCOUNTER — Inpatient Hospital Stay (HOSPITAL_BASED_OUTPATIENT_CLINIC_OR_DEPARTMENT_OTHER): Admitting: Oncology

## 2024-06-26 ENCOUNTER — Encounter: Payer: Self-pay | Admitting: Oncology

## 2024-06-26 VITALS — BP 121/77 | HR 64 | Temp 96.0°F | Resp 18 | Wt 192.4 lb

## 2024-06-26 DIAGNOSIS — Z5986 Financial insecurity: Secondary | ICD-10-CM | POA: Insufficient documentation

## 2024-06-26 DIAGNOSIS — C50911 Malignant neoplasm of unspecified site of right female breast: Secondary | ICD-10-CM | POA: Diagnosis not present

## 2024-06-26 DIAGNOSIS — D1803 Hemangioma of intra-abdominal structures: Secondary | ICD-10-CM | POA: Diagnosis not present

## 2024-06-26 DIAGNOSIS — C210 Malignant neoplasm of anus, unspecified: Secondary | ICD-10-CM

## 2024-06-26 DIAGNOSIS — R590 Localized enlarged lymph nodes: Secondary | ICD-10-CM | POA: Diagnosis not present

## 2024-06-26 DIAGNOSIS — M797 Fibromyalgia: Secondary | ICD-10-CM | POA: Diagnosis not present

## 2024-06-26 DIAGNOSIS — K219 Gastro-esophageal reflux disease without esophagitis: Secondary | ICD-10-CM | POA: Insufficient documentation

## 2024-06-26 DIAGNOSIS — C50919 Malignant neoplasm of unspecified site of unspecified female breast: Secondary | ICD-10-CM | POA: Diagnosis not present

## 2024-06-26 DIAGNOSIS — Z1721 Progesterone receptor positive status: Secondary | ICD-10-CM | POA: Diagnosis not present

## 2024-06-26 DIAGNOSIS — C775 Secondary and unspecified malignant neoplasm of intrapelvic lymph nodes: Secondary | ICD-10-CM | POA: Diagnosis not present

## 2024-06-26 DIAGNOSIS — Z79899 Other long term (current) drug therapy: Secondary | ICD-10-CM | POA: Diagnosis not present

## 2024-06-26 DIAGNOSIS — Z791 Long term (current) use of non-steroidal anti-inflammatories (NSAID): Secondary | ICD-10-CM | POA: Insufficient documentation

## 2024-06-26 DIAGNOSIS — R011 Cardiac murmur, unspecified: Secondary | ICD-10-CM | POA: Diagnosis not present

## 2024-06-26 DIAGNOSIS — K449 Diaphragmatic hernia without obstruction or gangrene: Secondary | ICD-10-CM | POA: Diagnosis not present

## 2024-06-26 DIAGNOSIS — Z171 Estrogen receptor negative status [ER-]: Secondary | ICD-10-CM | POA: Insufficient documentation

## 2024-06-26 DIAGNOSIS — Z1732 Human epidermal growth factor receptor 2 negative status: Secondary | ICD-10-CM | POA: Diagnosis not present

## 2024-06-26 DIAGNOSIS — M3210 Systemic lupus erythematosus, organ or system involvement unspecified: Secondary | ICD-10-CM | POA: Insufficient documentation

## 2024-06-26 DIAGNOSIS — B372 Candidiasis of skin and nail: Secondary | ICD-10-CM | POA: Insufficient documentation

## 2024-06-26 DIAGNOSIS — K760 Fatty (change of) liver, not elsewhere classified: Secondary | ICD-10-CM | POA: Diagnosis not present

## 2024-06-26 NOTE — Progress Notes (Signed)
 Accompanied patient medical oncology appointment.   Reviewed Breast Cancer treatment handbook.   Care plan summary given to patient.   Reviewed outreach programs and cancer center services.

## 2024-06-26 NOTE — Assessment & Plan Note (Signed)
 Recommend nystatin  powder topical 2-3 times daily.

## 2024-06-26 NOTE — Assessment & Plan Note (Signed)
 Mammogram imaging findings, biopsy pathology results were reviewed and discussed previously. 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  Patient's case is complicated as evidenced by 2 cancer primaries.  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  Anal squamous cell carcinoma treatments takes priority. I recommend patient to get upfront right lumpectomy with SLNB.  Then proceed with concurrent chemotherapy and radiation treatment for Stage III anal SCC. She may also receive adjuvant breast radiation.  Once she finished treatment for anal cancer, will proceed with adjuvant chemotherapy for TNBC.   Plan is subjected to change if PET showed distant metastatic disease.

## 2024-06-26 NOTE — Progress Notes (Signed)
 Hematology/Oncology Progress note [phone] [phone]        REFERRING PROVIDER: Epifanio Alm SQUIBB, MD    CHIEF COMPLAINTS/PURPOSE OF CONSULTATION:  Anal squamous cell carcinoma  ASSESSMENT & PLAN:   Anal squamous cell carcinoma (HCC) CT imaging findings inguinal lymph node biopsy pathology results were reviewed and discussed with patient. Left inguinal nodal involvement upgrade staging to stage III, cT3 N1a.  Per IR, right inguinal lymph node is not enlarged. Pending PET scan evaluation. Recommend concurrent chemotherapy 5-FU and mitomycin C with Radiation.  Now she has a second primary breast cancer, see below. Case will be presented on tumor board.    Invasive carcinoma of breast [facility]) Mammogram imaging findings, biopsy pathology results were reviewed and discussed previously. 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  Patient's case is complicated as evidenced by 2 cancer primaries.  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  Anal squamous cell carcinoma treatments takes priority. I recommend patient to get upfront right lumpectomy with SLNB.  Then proceed with concurrent chemotherapy and radiation treatment for Stage III anal SCC. She may also receive adjuvant breast radiation.  Once she finished treatment for anal cancer, will proceed with adjuvant chemotherapy for TNBC.   Plan is subjected to change if PET showed distant metastatic disease.    Skin candidiasis Recommend nystatin  powder topical 2-3 times daily.    No orders of the defined types were placed in this encounter.  Follow-up to be determined. Care plan was discussed with Dr. Tye All questions were answered. The patient knows to call the clinic with any problems, questions or concerns.  [name], MD, PhD [facility] Hematology Oncology 06/26/2024    HISTORY OF PRESENTING ILLNESS:   Monica Gonzalez 48 y.o. female presents to establish care for anal squamous cell carcinoma I have reviewed her chart and materials related to her cancer extensively and collaborated history with the patient. Summary of oncologic history is as follows: Oncology History  Anal squamous cell carcinoma (HCC)  05/29/2024 Initial Diagnosis   Anal squamous cell carcinoma   Patient has noticed a hard mass at the anal area which she initially thought was a hemorrhoid.  The mass has been present for more than a month and appears to be enlarging. Patient was referred to establish care with surgeon Dr. Tye  05/16/2024, incisional biopsy of anal mass showed 1. Anus, biopsy, mass :       - INVASIVE MODERATELY DIFFERENTIATED SQUAMOUS CELL CARCINOMA  06/19/2024, left inguinal adenopathy biopsy showed Metastatic moderately differentiated squamous cell carcinoma.  '    05/29/2024 Cancer Staging   Staging form: Anus, AJCC V9 - Clinical stage from 05/29/2024: Stage IIIA (cT3, cN1a, cM0) - Signed by Cap Zelphia, MD on 06/26/2024 Stage prefix: Initial diagnosis   05/30/2024 -  Chemotherapy   Patient is on Treatment Plan : ANUS Mitomycin D1,28 + 5FU D1-4, 28-31 q32d     06/05/2024 Imaging   CT chest abdomen pelvis with contrast  Slight thickening in the area of the anal canal. Please correlate for history of neoplasm. There is a pathologic enlarged left inguinal node. There is a borderline right-sided node.   No additional areas of abnormal nodal enlargement or other aggressive appearing mass lesion at this time.   Fatty liver infiltration with known segment 7 hepatic hemangioma.   Of note there is a right-sided upper breast mass which was not clearly seen  on prior CT scan. Please correlate for any prior study or if needed diagnostic mammographic evaluation and possible ultrasound when appropriate.     06/12/2024 Imaging   CT chest abdomen pelvis with contrast showed Slight thickening in the area of the  anal canal. Please correlate for history of neoplasm. There is a pathologic enlarged left inguinal node. There is a borderline right-sided node.   No additional areas of abnormal nodal enlargement or other aggressive appearing mass lesion at this time.   Fatty liver infiltration with known segment 7 hepatic hemangioma.   Of note there is a right-sided upper breast mass which was not clearly seen on prior CT scan. Please correlate for any prior study or if needed diagnostic mammographic evaluation and possible ultrasound when appropriate.   Invasive carcinoma of breast (HCC)  06/12/2024 Imaging   CT chest abdomen pelvis with contrast showed Slight thickening in the area of the anal canal. Please correlate for history of neoplasm. There is a pathologic enlarged left inguinal node. There is a borderline right-sided node.   No additional areas of abnormal nodal enlargement or other aggressive appearing mass lesion at this time.   Fatty liver infiltration with known segment 7 hepatic hemangioma.   Of note there is a right-sided upper breast mass which was not clearly seen on prior CT scan. Please correlate for any prior study or if needed diagnostic mammographic evaluation and possible ultrasound when appropriate.   06/17/2024 Mammogram   1. There is a suspicious 17 mm mass at the site of CT and mammographic concern in the RIGHT breast. It demonstrates associated pleomorphic calcifications. Recommend ultrasound-guided biopsy for definitive characterization. 2. There is incidental sonographic note of a 7 mm mass in the RIGHT breast at 12 o'clock and a 6 mm non mass area at 9 o'clock. Recommend ultrasound-guided biopsy of these 2 areas for definitive characterization given suspicious appearance of the dominant mass. 3. No suspicious RIGHT axillary adenopathy. 4. No mammographic evidence of malignancy in the LEFT breast.   06/18/2024 Cancer Staging   Staging form: Breast, AJCC 8th  Edition - Clinical stage from 06/18/2024: Stage IB (cT1b, cN0, cM0, G3, ER-, PR-, HER2-) - Signed by Babara Call, MD on 06/26/2024 Stage prefix: Initial diagnosis Histologic grading system: 3 grade system   06/18/2024 Initial Diagnosis   Invasive carcinoma of breast [facility])  Patient with right breast biopsy.  Pathology showed 1. Breast, right, needle core biopsy, 12:00 12cmfn (heart clip) :      INVASIVE DUCTAL CARCINOMA      TUBULE FORMATION: SCORE 3      NUCLEAR PLEOMORPHISM: SCORE 3      MITOTIC COUNT: SCORE 2      TOTAL SCORE: 8      OVERALL GRADE: 3      LYMPHOVASCULAR INVASION: NOT IDENTIFIED      CANCER LENGTH: 1.0 CM      CALCIFICATIONS: NOT IDENTIFIED      OTHER FINDINGS: NONE      ER negative, PR negative, HER2 negative (IHC 0) Ki-67 95%.       2. Breast, right, needle core biopsy, 12:00 8cmfn (coil clip) :      INVASIVE DUCTAL CARCINOMA      DUCTAL CARCINOMA IN SITU, SOLID, HIGH NUCLEAR GRADE WITH NECROSIS      TUBULE FORMATION: SCORE 3      NUCLEAR PLEOMORPHISM: SCORE 3      MITOTIC COUNT: SCORE 2      TOTAL SCORE: 8  OVERALL GRADE: 3      LYMPHOVASCULAR INVASION: NOT IDENTIFIED      CANCER LENGTH: 0.3 CM      CALCIFICATIONS: NOT IDENTIFIED      OTHER FINDINGS: NONE            3. Breast, right, needle core biopsy, 9:00 12cmfn (venus) :      BENIGN BREAST TISSUE WITH DENSE STROMAL FIBROSIS.      NEGATIVE FOR ATYPIA OR MALIGNANCY.     Patient has a history of VIN 3 status post vulvectomy  Today patient presents to discuss CT scan results, breast images and biopsy pathology results. + Rectal discomfort/drainage  MEDICAL HISTORY:  Past Medical History:  Diagnosis Date   Anemia    Asthma    Cancer (HCC)    Fibromyalgia    GERD (gastroesophageal reflux disease)    Heart murmur    History of hiatal hernia    Lupus     SURGICAL HISTORY: Past Surgical History:  Procedure Laterality Date   BREAST BIOPSY Right 06/18/2024   US  RT BREAST BX W LOC DEV 1ST LESION  IMG BX SPEC US  GUIDE 06/18/2024 [facility]   BREAST BIOPSY Right 06/18/2024   US  RT BREAST BX W LOC DEV EA ADD LESION IMG BX SPEC US  GUIDE 06/18/2024 [facility]   BREAST BIOPSY Right 06/18/2024   US  RT BREAST BX W LOC DEV EA ADD LESION IMG BX SPEC US  GUIDE 06/18/2024 [facility]   COLONOSCOPY WITH PROPOFOL  N/A 02/17/2023   Procedure: COLONOSCOPY WITH PROPOFOL ;  Surgeon: Maryruth Ole DASEN, MD;  Location: [facility];  Service: Endoscopy;  Laterality: N/A;   ESOPHAGOGASTRODUODENOSCOPY (EGD) WITH PROPOFOL  N/A 02/17/2023   Procedure: ESOPHAGOGASTRODUODENOSCOPY (EGD) WITH PROPOFOL ;  Surgeon: Maryruth Ole DASEN, MD;  Location: [facility] ENDOSCOPY;  Service: Endoscopy;  Laterality: N/A;   RECTAL EXAM UNDER ANESTHESIA N/A 05/16/2024   Procedure: EXAM UNDER ANESTHESIA, RECTUM, INCISIONAL BIOPSY OF ANAL MASS;  Surgeon: Tye Millet, DO;  Location: [facility] OR[facility];  Service: General;  Laterality: N/A;   TONSILLECTOMY     TUBAL LIGATION     VULVECTOMY N/A 07/29/2020   Procedure: WIDE EXCISION VULVECTOMY;  Surgeon: Mancil Barter, MD;  Location: [facility] ORS;  Service: Gynecology;  Laterality: N/A;    SOCIAL HISTORY: Social History   Socioeconomic History   Marital status: Legally Separated    Spouse name: Not on file   Number of children: 3   Years of education: Not on file   Highest education level: Not on file  Occupational History   Not on file  Tobacco Use   Smoking status: Never   Smokeless tobacco: Never  Vaping Use   Vaping status: Never Used  Substance and Sexual Activity   Alcohol use: Not Currently   Drug use: No   Sexual activity: Yes  Other Topics Concern   Not on file  Social History Narrative   Not on file   Social Drivers of Health   Financial Resource Strain: Medium Risk (05/15/2024)   Received from [facility]   Overall Financial Resource Strain (CARDIA)    Difficulty of Paying Living Expenses: Somewhat hard  Food Insecurity: No Food Insecurity (05/29/2024)    Hunger Vital Sign    Worried About Running Out of Food in the Last Year: Never true    Ran Out of Food in the Last Year: Never true  Transportation Needs: No Transportation Needs (05/29/2024)   PRAPARE - Administrator, Civil Service (Medical): No  Lack of Transportation (Non-Medical): No  Physical Activity: Not on file  Stress: Not on file  Social Connections: Not on file  Intimate Partner Violence: Not At Risk (05/29/2024)   Humiliation, Afraid, Rape, and Kick questionnaire    Fear of Current or Ex-Partner: No    Emotionally Abused: No    Physically Abused: No    Sexually Abused: No    FAMILY HISTORY: Family History  Problem Relation Age of Onset   Diabetes Mother    Congestive Heart Failure Mother    Lupus Maternal Grandmother     ALLERGIES:  is allergic to prednisone.  MEDICATIONS:  Current Outpatient Medications  Medication Sig Dispense Refill   albuterol  (PROVENTIL ) (2.5 MG/3ML) 0.083% nebulizer solution Take 2.5 mg by nebulization every 6 (six) hours as needed for shortness of breath or wheezing.     albuterol  (VENTOLIN  HFA) 108 (90 Base) MCG/ACT inhaler Inhale 2 puffs into the lungs every 6 (six) hours as needed for wheezing or shortness of breath. 8 g 2   busPIRone (BUSPAR) 5 MG tablet Take 5 mg by mouth 2 (two) times daily.     celecoxib (CELEBREX) 200 MG capsule Take 200 mg by mouth 2 (two) times daily.     clonazePAM (KLONOPIN) 0.5 MG tablet Take 0.5 mg by mouth at bedtime as needed (anxiety/sleep).     DULoxetine (CYMBALTA) 30 MG capsule Take 30 mg by mouth at bedtime.     ferrous sulfate 325 (65 FE) MG tablet Take 325 mg by mouth daily.      gabapentin (NEURONTIN) 300 MG capsule Take 600 mg by mouth 2 (two) times daily.     ibuprofen  (ADVIL ) 800 MG tablet Take 800 mg by mouth every 8 (eight) hours as needed.     lidocaine  (LIDODERM ) 5 % 1 patch daily.     naproxen  (EC NAPROSYN ) 500 MG EC tablet Take 1 tablet (500 mg total) by mouth 2 (two) times  daily with a meal. 60 tablet 0   nystatin  (MYCOSTATIN /NYSTOP ) powder Apply 1 Application topically 2 (two) times daily. 20 g 0   pantoprazole (PROTONIX) 40 MG tablet Take by mouth.     SUMAtriptan (IMITREX) 50 MG tablet Take 50 mg by mouth every 2 (two) hours as needed for migraine.     traMADol  (ULTRAM ) 50 MG tablet Take 1 tablet (50 mg total) by mouth every 8 (eight) hours as needed. 6 tablet 0   traZODone (DESYREL) 50 MG tablet Take 50 mg by mouth at bedtime.     No current facility-administered medications for this visit.    Review of Systems  Constitutional:  Negative for appetite change, chills, fatigue and fever.  HENT:   Negative for hearing loss and voice change.   Eyes:  Negative for eye problems.  Respiratory:  Negative for chest tightness and cough.   Cardiovascular:  Negative for chest pain.  Gastrointestinal:  Negative for abdominal distention, abdominal pain and blood in stool.       Anal mass with drainage  Endocrine: Negative for hot flashes.  Genitourinary:  Negative for difficulty urinating and frequency.   Musculoskeletal:  Negative for arthralgias.  Skin:  Negative for itching and rash.  Neurological:  Negative for extremity weakness.  Hematological:  Negative for adenopathy.  Psychiatric/Behavioral:  Negative for confusion.      PHYSICAL EXAMINATION: ECOG PERFORMANCE STATUS: 0 - Asymptomatic  Vitals:   06/26/24 1336  BP: 121/77  Pulse: 64  Resp: 18  Temp: (!) 96 F (35.6 C)  SpO2: 100%   Filed Weights   06/26/24 1336  Weight: 192 lb 6.4 oz (87.3 kg)    Physical Exam Constitutional:      General: She is not in acute distress.    Appearance: She is not diaphoretic.  HENT:     Head: Normocephalic and atraumatic.     Nose: Nose normal.     Mouth/Throat:     Pharynx: No oropharyngeal exudate.  Eyes:     General: No scleral icterus.    Pupils: Pupils are equal, round, and reactive to light.  Cardiovascular:     Rate and Rhythm: Normal rate and  regular rhythm.     Heart sounds: No murmur heard. Pulmonary:     Effort: Pulmonary effort is normal. No respiratory distress.     Breath sounds: No rales.  Chest:     Chest wall: No tenderness.  Abdominal:     General: There is no distension.     Palpations: Abdomen is soft.     Tenderness: There is no abdominal tenderness.  Genitourinary:    Comments: Firm anal mass 4-5cm Musculoskeletal:        General: Normal range of motion.     Cervical back: Normal range of motion and neck supple.  Skin:    General: Skin is warm and dry.     Findings: No erythema.  Neurological:     Mental Status: She is alert and oriented to person, place, and time.     Cranial Nerves: No cranial nerve deficit.     Motor: No abnormal muscle tone.     Coordination: Coordination normal.  Psychiatric:        Mood and Affect: Affect normal.   05/29/24      Bilateral breast examination was performed. Right breast status post biopsy with focal tissue swelling and bruising.  No palpable breast mass in bilateral breasts.  No palpable lymphadenopathy in bilateral axilla  LABORATORY DATA:  I have reviewed the data as listed    Latest Ref Rng & Units 02/19/2024   12:16 PM 06/28/2023    3:29 PM 06/22/2023    2:13 PM  CBC  WBC 4.0 - 10.5 K/uL 6.3  9.7  5.6   Hemoglobin 12.0 - 15.0 g/dL 86.1  85.9  86.5   Hematocrit 36.0 - 46.0 % 41.8  45.5  43.1   Platelets 150 - 400 K/uL 269  309  305       Latest Ref Rng & Units 05/29/2024   11:37 AM 02/19/2024   12:16 PM 06/28/2023    3:29 PM  CMP  Glucose 70 - 99 mg/dL 90  897  84   BUN 6 - 20 mg/dL 10  7  9    Creatinine 0.44 - 1.00 mg/dL 9.28  9.19  9.15   Sodium 135 - 145 mmol/L 135  141  139   Potassium 3.5 - 5.1 mmol/L 3.9  3.8  3.7   Chloride 98 - 111 mmol/L 105  111  108   CO2 22 - 32 mmol/L 23  23  20    Calcium 8.9 - 10.3 mg/dL 8.6  8.7  8.6   Total Protein 6.5 - 8.1 g/dL 6.9  6.2    Total Bilirubin 0.0 - 1.2 mg/dL 0.6  0.9    Alkaline Phos 38 - 126 U/L 73   53    AST 15 - 41 U/L 26  28    ALT 0 - 44 U/L 20  24  RADIOGRAPHIC STUDIES: I have personally reviewed the radiological images as listed and agreed with the findings in the report. US  RT BREAST BX W LOC DEV 1ST LESION IMG BX SPEC US  GUIDE Addendum Date: 06/19/2024 ADDENDUM REPORT: 06/19/2024 14:32 ADDENDUM: PATHOLOGY revealed: Site 1. 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 Pathology results are CONCORDANT with imaging findings, per Corean Salter M.D. PATHOLOGY revealed: Site 2. Breast, right, needle core biopsy, 12:00; 8 cmfn (coil clip) : INVASIVE DUCTAL CARCINOMA - DUCTAL CARCINOMA IN SITU, SOLID, HIGH NUCLEAR GRADE WITH NECROSIS. OVERALL GRADE: 3 - LYMPHOVASCULAR INVASION: NOT IDENTIFIED. CANCER LENGTH: 0.3 CM. CALCIFICATIONS: NOT IDENTIFIED Pathology results are CONCORDANT with imaging findings, per Corean Salter M.D. PATHOLOGY revealed: Site 3. Breast, right, needle core biopsy, 9:00; 12 cmfn (venus) : BENIGN BREAST TISSUE WITH DENSE STROMAL FIBROSIS. NEGATIVE FOR ATYPIA OR MALIGNANCY. Pathology results are CONCORDANT with imaging findings, per Corean Salter M.D. Pathology results and recommendations below were discussed with patient by telephone on 06/19/2024 by Rock Hover RN. Patient reported biopsy site within normal limits with slight tenderness at the site. Post biopsy care instructions were reviewed, questions were answered and my direct phone number was provided to patient. Patient was instructed to call [facility] if any concerns or questions arise related to the biopsy. RECOMMENDATIONS: 1. Surgical and oncological consultation. Request for surgical and oncological consultation relayed to Shasta Ada RN at [facility] by Rock Hover RN on 06/19/2024. 2. Recommend pretreatment bilateral  breast MRI with and without contrast to assess extent of breast disease, given the patient's breast density and age. 3. The 2 sites of biopsy-proven malignancy (COIL and heart clip) are approximately 4.7 cm apart. Pathology results reported by Rock Hover RN on 06/19/2024. Electronically Signed   By: Corean Salter M.D.   On: 06/19/2024 14:32   Result Date: 06/19/2024 CLINICAL DATA:  Suspicious RIGHT breast mass. Two additional sonographically identified areas in the RIGHT breast EXAM: ULTRASOUND GUIDED RIGHT BREAST CORE NEEDLE BIOPSY x 3 sites COMPARISON:  Previous exam(s). PROCEDURE: I met with the patient and we discussed the procedure of ultrasound-guided biopsy, including benefits and alternatives. We discussed the high likelihood of a successful procedure. We discussed the risks of the procedure, including infection, bleeding, tissue injury, clip migration, and inadequate sampling. Informed written consent was given. The usual time-out protocol was performed immediately prior to the procedure. Site 1: 12 o'clock 12 cm suspicious mass with calcifications Lesion quadrant: Upper outer quadrant Using sterile technique and 1% lidocaine  and 1% lidocaine  with epinephrine  as local anesthetic, under direct ultrasound visualization, a 14 gauge spring-loaded device was used to perform biopsy of a mass at 12 o'clock 12 cm from the nipple using a inferolateral approach. At the conclusion of the procedure a heart shaped tissue marker clip was deployed into the biopsy cavity. Follow up 2 view mammogram was performed and dictated separately. Site 2: 12 o'clock 8 cm from nipple sonographically identified mass Lesion quadrant: Upper outer quadrant Using sterile technique and 1% lidocaine  and 1% lidocaine  with epinephrine  as local anesthetic, under direct ultrasound visualization, a 14 gauge spring-loaded device was used to perform biopsy of a mass at 12 o'clock 8 cm from the nipple using a lateral approach. At the conclusion  of the procedure a COIL shaped tissue marker clip was deployed into the biopsy cavity. Follow up 2 view  mammogram was performed and dictated separately. Site 3: Sonographically identified non mass area at 9 o'clock 12 cm from the nipple Lesion quadrant: Upper outer quadrant Using sterile technique and 1% lidocaine  and 1% lidocaine  with epinephrine  as local anesthetic, under direct ultrasound visualization, a 14 gauge spring-loaded device was used to perform biopsy of a non mass area at 9 o'clock using a lateral approach. At the conclusion of the procedure a venus shaped tissue marker clip was deployed into the biopsy cavity. Follow up 2 view mammogram was performed and dictated separately. IMPRESSION: 1. Ultrasound guided biopsy of a suspicious RIGHT breast mass at 12 o'clock 12 cm from the nipple. No apparent complications. 2. Ultrasound-guided biopsy of 2 additional sonographically identified areas. No apparent complications. Electronically Signed: By: Corean Salter M.D. On: 06/18/2024 15:05   US  RT BREAST BX W LOC DEV EA ADD LESION IMG BX SPEC US  GUIDE Addendum Date: 06/19/2024 ADDENDUM REPORT: 06/19/2024 14:32 ADDENDUM: PATHOLOGY revealed: Site 1. 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 Pathology results are CONCORDANT with imaging findings, per Corean Salter M.D. PATHOLOGY revealed: Site 2. Breast, right, needle core biopsy, 12:00; 8 cmfn (coil clip) : INVASIVE DUCTAL CARCINOMA - DUCTAL CARCINOMA IN SITU, SOLID, HIGH NUCLEAR GRADE WITH NECROSIS. OVERALL GRADE: 3 - LYMPHOVASCULAR INVASION: NOT IDENTIFIED. CANCER LENGTH: 0.3 CM. CALCIFICATIONS: NOT IDENTIFIED Pathology results are CONCORDANT with imaging findings, per Corean Salter M.D. PATHOLOGY revealed: Site 3. Breast, right, needle core biopsy, 9:00; 12 cmfn (venus) : BENIGN  BREAST TISSUE WITH DENSE STROMAL FIBROSIS. NEGATIVE FOR ATYPIA OR MALIGNANCY. Pathology results are CONCORDANT with imaging findings, per Corean Salter M.D. Pathology results and recommendations below were discussed with patient by telephone on 06/19/2024 by Rock Hover RN. Patient reported biopsy site within normal limits with slight tenderness at the site. Post biopsy care instructions were reviewed, questions were answered and my direct phone number was provided to patient. Patient was instructed to call [facility] if any concerns or questions arise related to the biopsy. RECOMMENDATIONS: 1. Surgical and oncological consultation. Request for surgical and oncological consultation relayed to Shasta Ada RN at [facility] by Rock Hover RN on 06/19/2024. 2. Recommend pretreatment bilateral breast MRI with and without contrast to assess extent of breast disease, given the patient's breast density and age. 3. The 2 sites of biopsy-proven malignancy (COIL and heart clip) are approximately 4.7 cm apart. Pathology results reported by Rock Hover RN on 06/19/2024. Electronically Signed   By: Corean Salter M.D.   On: 06/19/2024 14:32   Result Date: 06/19/2024 CLINICAL DATA:  Suspicious RIGHT breast mass. Two additional sonographically identified areas in the RIGHT breast EXAM: ULTRASOUND GUIDED RIGHT BREAST CORE NEEDLE BIOPSY x 3 sites COMPARISON:  Previous exam(s). PROCEDURE: I met with the patient and we discussed the procedure of ultrasound-guided biopsy, including benefits and alternatives. We discussed the high likelihood of a successful procedure. We discussed the risks of the procedure, including infection, bleeding, tissue injury, clip migration, and inadequate sampling. Informed written consent was given. The usual time-out protocol was performed immediately prior to the procedure. Site 1: 12 o'clock 12 cm suspicious mass with calcifications Lesion quadrant: Upper outer quadrant  Using sterile technique and 1% lidocaine  and 1% lidocaine  with epinephrine  as local anesthetic, under direct ultrasound visualization, a 14 gauge spring-loaded device was used to perform biopsy of a  mass at 12 o'clock 12 cm from the nipple using a inferolateral approach. At the conclusion of the procedure a heart shaped tissue marker clip was deployed into the biopsy cavity. Follow up 2 view mammogram was performed and dictated separately. Site 2: 12 o'clock 8 cm from nipple sonographically identified mass Lesion quadrant: Upper outer quadrant Using sterile technique and 1% lidocaine  and 1% lidocaine  with epinephrine  as local anesthetic, under direct ultrasound visualization, a 14 gauge spring-loaded device was used to perform biopsy of a mass at 12 o'clock 8 cm from the nipple using a lateral approach. At the conclusion of the procedure a COIL shaped tissue marker clip was deployed into the biopsy cavity. Follow up 2 view mammogram was performed and dictated separately. Site 3: Sonographically identified non mass area at 9 o'clock 12 cm from the nipple Lesion quadrant: Upper outer quadrant Using sterile technique and 1% lidocaine  and 1% lidocaine  with epinephrine  as local anesthetic, under direct ultrasound visualization, a 14 gauge spring-loaded device was used to perform biopsy of a non mass area at 9 o'clock using a lateral approach. At the conclusion of the procedure a venus shaped tissue marker clip was deployed into the biopsy cavity. Follow up 2 view mammogram was performed and dictated separately. IMPRESSION: 1. Ultrasound guided biopsy of a suspicious RIGHT breast mass at 12 o'clock 12 cm from the nipple. No apparent complications. 2. Ultrasound-guided biopsy of 2 additional sonographically identified areas. No apparent complications. Electronically Signed: By: Corean Salter M.D. On: 06/18/2024 15:05   US  RT BREAST BX W LOC DEV EA ADD LESION IMG BX SPEC US  GUIDE Addendum Date: 06/19/2024 ADDENDUM  REPORT: 06/19/2024 14:32 ADDENDUM: PATHOLOGY revealed: Site 1. 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 Pathology results are CONCORDANT with imaging findings, per Corean Salter M.D. PATHOLOGY revealed: Site 2. Breast, right, needle core biopsy, 12:00; 8 cmfn (coil clip) : INVASIVE DUCTAL CARCINOMA - DUCTAL CARCINOMA IN SITU, SOLID, HIGH NUCLEAR GRADE WITH NECROSIS. OVERALL GRADE: 3 - LYMPHOVASCULAR INVASION: NOT IDENTIFIED. CANCER LENGTH: 0.3 CM. CALCIFICATIONS: NOT IDENTIFIED Pathology results are CONCORDANT with imaging findings, per Corean Salter M.D. PATHOLOGY revealed: Site 3. Breast, right, needle core biopsy, 9:00; 12 cmfn (venus) : BENIGN BREAST TISSUE WITH DENSE STROMAL FIBROSIS. NEGATIVE FOR ATYPIA OR MALIGNANCY. Pathology results are CONCORDANT with imaging findings, per Corean Salter M.D. Pathology results and recommendations below were discussed with patient by telephone on 06/19/2024 by Rock Hover RN. Patient reported biopsy site within normal limits with slight tenderness at the site. Post biopsy care instructions were reviewed, questions were answered and my direct phone number was provided to patient. Patient was instructed to call [facility] if any concerns or questions arise related to the biopsy. RECOMMENDATIONS: 1. Surgical and oncological consultation. Request for surgical and oncological consultation relayed to Shasta Ada RN at [facility] by Rock Hover RN on 06/19/2024. 2. Recommend pretreatment bilateral breast MRI with and without contrast to assess extent of breast disease, given the patient's breast density and age. 3. The 2 sites of biopsy-proven malignancy (COIL and heart clip) are approximately 4.7 cm apart. Pathology results reported by Rock Hover RN on 06/19/2024. Electronically  Signed   By: Corean Salter M.D.   On: 06/19/2024 14:32   Result Date: 06/19/2024 CLINICAL DATA:  Suspicious RIGHT breast mass. Two additional sonographically identified areas in the  RIGHT breast EXAM: ULTRASOUND GUIDED RIGHT BREAST CORE NEEDLE BIOPSY x 3 sites COMPARISON:  Previous exam(s). PROCEDURE: I met with the patient and we discussed the procedure of ultrasound-guided biopsy, including benefits and alternatives. We discussed the high likelihood of a successful procedure. We discussed the risks of the procedure, including infection, bleeding, tissue injury, clip migration, and inadequate sampling. Informed written consent was given. The usual time-out protocol was performed immediately prior to the procedure. Site 1: 12 o'clock 12 cm suspicious mass with calcifications Lesion quadrant: Upper outer quadrant Using sterile technique and 1% lidocaine  and 1% lidocaine  with epinephrine  as local anesthetic, under direct ultrasound visualization, a 14 gauge spring-loaded device was used to perform biopsy of a mass at 12 o'clock 12 cm from the nipple using a inferolateral approach. At the conclusion of the procedure a heart shaped tissue marker clip was deployed into the biopsy cavity. Follow up 2 view mammogram was performed and dictated separately. Site 2: 12 o'clock 8 cm from nipple sonographically identified mass Lesion quadrant: Upper outer quadrant Using sterile technique and 1% lidocaine  and 1% lidocaine  with epinephrine  as local anesthetic, under direct ultrasound visualization, a 14 gauge spring-loaded device was used to perform biopsy of a mass at 12 o'clock 8 cm from the nipple using a lateral approach. At the conclusion of the procedure a COIL shaped tissue marker clip was deployed into the biopsy cavity. Follow up 2 view mammogram was performed and dictated separately. Site 3: Sonographically identified non mass area at 9 o'clock 12 cm from the nipple Lesion quadrant: Upper outer quadrant Using  sterile technique and 1% lidocaine  and 1% lidocaine  with epinephrine  as local anesthetic, under direct ultrasound visualization, a 14 gauge spring-loaded device was used to perform biopsy of a non mass area at 9 o'clock using a lateral approach. At the conclusion of the procedure a venus shaped tissue marker clip was deployed into the biopsy cavity. Follow up 2 view mammogram was performed and dictated separately. IMPRESSION: 1. Ultrasound guided biopsy of a suspicious RIGHT breast mass at 12 o'clock 12 cm from the nipple. No apparent complications. 2. Ultrasound-guided biopsy of 2 additional sonographically identified areas. No apparent complications. Electronically Signed: By: Corean Salter M.D. On: 06/18/2024 15:05   US  CORE BIOPSY (LYMPH NODES) Result Date: 06/19/2024 INDICATION: Anal squamous cell carcinoma, abnormal left inguinal adenopathy EXAM: ULTRASOUND GUIDED CORE BIOPSY OF LEFT INGUINAL ADENOPATHY MEDICATIONS: 1% LIDOCAINE  LOCAL ANESTHESIA/SEDATION: None. FLUOROSCOPY: Fluoroscopy Time: None. COMPLICATIONS: None immediate. PROCEDURE: The procedure, risks, benefits, and alternatives were explained to the patient. Questions regarding the procedure were encouraged and answered. The patient understands and consents to the procedure. Previous imaging reviewed. Preliminary ultrasound performed. By CT, there was concern for bilateral inguinal adenopathy. Imaging of the right inguinal area demonstrates nonenlarged benign superficial lymph nodes with preserved architecture. No abnormal right inguinal adenopathy. Imaging of the left inguinal area confirms abnormal enlarged hypoechoic left inguinal adenopathy with loss of normal architecture. This correlates with the abnormal adenopathy by CT and was marked for biopsy. Left inguinal adenopathy 18 gauge core biopsy: Under sterile conditions and local anesthesia, the 18 gauge core biopsy was advanced to the left inguinal adenopathy. Several 18 gauge core  biopsies obtained. These were placed in formalin. Postprocedure imaging demonstrates no hemorrhage or hematoma. Patient tolerated biopsy well. FINDINGS: Imaging confirms needle placement in the left inguinal abnormal adenopathy for 18 gauge core biopsy IMPRESSION: Successful ultrasound left inguinal adenopathy 18 gauge core biopsies These results were called by telephone at the  time of interpretation on 06/19/2024 at 2:29 pm to provider [facility] , who verbally acknowledged these results. Electronically Signed   By: CHRISTELLA.  Shick M.D.   On: 06/19/2024 14:30   MM CLIP PLACEMENT RIGHT Result Date: 06/18/2024 CLINICAL DATA:  Status post ultrasound-guided biopsy EXAM: 3D DIAGNOSTIC RIGHT MAMMOGRAM POST ULTRASOUND BIOPSY COMPARISON:  Previous exam(s). ACR Breast Density Category c: The breasts are heterogeneously dense, which may obscure small masses. FINDINGS: 3D Mammographic images were obtained following ultrasound guided biopsy of mass at 12 o'clock 12 cm from the nipple. The heart biopsy marking clip is in expected position at the site of biopsy. This is at the site of suspicious mass with associated calcifications 3D Mammographic images were obtained following ultrasound guided biopsy of a mass at 12 o'clock 8 cm from the nipple. The COIL biopsy marking clip is in expected position at the site of biopsy. 3D Mammographic images were obtained following ultrasound guided biopsy of a non mass area at 9 o'clock 12 cm from the nipple. The venus biopsy marking clip is in expected position at the site of biopsy. IMPRESSION: 1. Appropriate positioning of the heart shaped biopsy marking clip at the site of biopsy in the upper breast. 2. Appropriate positioning of the COIL shaped biopsy marking clip at the site of biopsy in the upper breast. 3. Appropriate positioning of the venus shaped biopsy marking clip at the site of biopsy in the upper outer breast. Final Assessment: Post Procedure Mammograms for Marker Placement  Electronically Signed   By: Corean Salter M.D.   On: 06/18/2024 15:02   MM 3D DIAGNOSTIC MAMMOGRAM BILATERAL BREAST Result Date: 06/17/2024 CLINICAL DATA:  Patient with recently diagnosed anal cancer. On staging examination, a mass was noted in the RIGHT upper breast. EXAM: DIGITAL DIAGNOSTIC BILATERAL MAMMOGRAM WITH TOMOSYNTHESIS AND CAD; ULTRASOUND RIGHT BREAST LIMITED TECHNIQUE: Bilateral digital diagnostic mammography and breast tomosynthesis was performed. The images were evaluated with computer-aided detection. ; Targeted ultrasound examination of the right breast was performed COMPARISON:  None available. ACR Breast Density Category c: The breasts are heterogeneously dense, which may obscure small masses. FINDINGS: Diagnostic images of the RIGHT breast demonstrate an irregular mass with associated pleomorphic calcifications in the upper breast at middle depth. Mass is estimated to span 17 mm. Associated calcifications are estimated to span approximately 17 mm. A superficial oval mass noted in the RIGHT upper outer breast on true lateral slice 57 with possible internal fat density. No suspicious mass, distortion, or microcalcifications are identified to suggest presence of malignancy in the LEFT breast. On physical exam, there is a hard mass in the upper breast. Targeted ultrasound was performed of the upper breast. There is an irregular hypoechoic mass at 12 o'clock 12 cm from the nipple with indistinct margins. It demonstrates multiple associated echogenic foci and posterior acoustic shadowing. It spans approximately 15 x 11 x 12 mm and corresponds to the site of mammographic concern. Targeted ultrasound was performed of the RIGHT axilla. No suspicious axillary lymph nodes are visualized. Targeted ultrasound was performed of the RIGHT upper outer breast. There is incidental sonographic note of an oval hypoechoic mass with relatively circumscribed margins at 12 o'clock 8 cm from the nipple. This  measures 7 x 4 by 6 mm. It is located approximately 3.5 cm from the margin of the dominant mass. There is incidental sonographic note of a hypoechoic non mass area at 9 o'clock 12 cm from the nipple. This measures approximately 6 x 3 x 5 mm.  At 10 o'clock 10 cm from the nipple, there is a superficial oval anechoic mass with posterior acoustic shadowing and suggestion of a mild adjacent echogenic rind. This measures 3 by 4 x 3 mm. BB was placed at the site and repeat spot tangential imaging was performed. This demonstrates a small amount of internal fat density (spot tangential slice 34) at this site. This corresponds to the superficial mass noted mammographically and is favored to reflect benign fat necrosis. IMPRESSION: 1. There is a suspicious 17 mm mass at the site of CT and mammographic concern in the RIGHT breast. It demonstrates associated pleomorphic calcifications. Recommend ultrasound-guided biopsy for definitive characterization. 2. There is incidental sonographic note of a 7 mm mass in the RIGHT breast at 12 o'clock and a 6 mm non mass area at 9 o'clock. Recommend ultrasound-guided biopsy of these 2 areas for definitive characterization given suspicious appearance of the dominant mass. 3. No suspicious RIGHT axillary adenopathy. 4. No mammographic evidence of malignancy in the LEFT breast. RECOMMENDATION: RIGHT breast ultrasound-guided biopsy x3 I have discussed the findings and recommendations with the patient. The biopsy procedure was discussed with the patient and questions were answered. Patient expressed their understanding of the biopsy recommendation. Patient will be scheduled for biopsy at her earliest convenience by the schedulers. Ordering provider will be notified. If applicable, a reminder letter will be sent to the patient regarding the next appointment. BI-RADS CATEGORY  5: Highly suggestive of malignancy. Electronically Signed   By: Corean Salter M.D.   On: 06/17/2024 13:52   US   LIMITED ULTRASOUND INCLUDING AXILLA RIGHT BREAST Result Date: 06/17/2024 CLINICAL DATA:  Patient with recently diagnosed anal cancer. On staging examination, a mass was noted in the RIGHT upper breast. EXAM: DIGITAL DIAGNOSTIC BILATERAL MAMMOGRAM WITH TOMOSYNTHESIS AND CAD; ULTRASOUND RIGHT BREAST LIMITED TECHNIQUE: Bilateral digital diagnostic mammography and breast tomosynthesis was performed. The images were evaluated with computer-aided detection. ; Targeted ultrasound examination of the right breast was performed COMPARISON:  None available. ACR Breast Density Category c: The breasts are heterogeneously dense, which may obscure small masses. FINDINGS: Diagnostic images of the RIGHT breast demonstrate an irregular mass with associated pleomorphic calcifications in the upper breast at middle depth. Mass is estimated to span 17 mm. Associated calcifications are estimated to span approximately 17 mm. A superficial oval mass noted in the RIGHT upper outer breast on true lateral slice 57 with possible internal fat density. No suspicious mass, distortion, or microcalcifications are identified to suggest presence of malignancy in the LEFT breast. On physical exam, there is a hard mass in the upper breast. Targeted ultrasound was performed of the upper breast. There is an irregular hypoechoic mass at 12 o'clock 12 cm from the nipple with indistinct margins. It demonstrates multiple associated echogenic foci and posterior acoustic shadowing. It spans approximately 15 x 11 x 12 mm and corresponds to the site of mammographic concern. Targeted ultrasound was performed of the RIGHT axilla. No suspicious axillary lymph nodes are visualized. Targeted ultrasound was performed of the RIGHT upper outer breast. There is incidental sonographic note of an oval hypoechoic mass with relatively circumscribed margins at 12 o'clock 8 cm from the nipple. This measures 7 x 4 by 6 mm. It is located approximately 3.5 cm from the margin of  the dominant mass. There is incidental sonographic note of a hypoechoic non mass area at 9 o'clock 12 cm from the nipple. This measures approximately 6 x 3 x 5 mm. At 10 o'clock 10 cm from  the nipple, there is a superficial oval anechoic mass with posterior acoustic shadowing and suggestion of a mild adjacent echogenic rind. This measures 3 by 4 x 3 mm. BB was placed at the site and repeat spot tangential imaging was performed. This demonstrates a small amount of internal fat density (spot tangential slice 34) at this site. This corresponds to the superficial mass noted mammographically and is favored to reflect benign fat necrosis. IMPRESSION: 1. There is a suspicious 17 mm mass at the site of CT and mammographic concern in the RIGHT breast. It demonstrates associated pleomorphic calcifications. Recommend ultrasound-guided biopsy for definitive characterization. 2. There is incidental sonographic note of a 7 mm mass in the RIGHT breast at 12 o'clock and a 6 mm non mass area at 9 o'clock. Recommend ultrasound-guided biopsy of these 2 areas for definitive characterization given suspicious appearance of the dominant mass. 3. No suspicious RIGHT axillary adenopathy. 4. No mammographic evidence of malignancy in the LEFT breast. RECOMMENDATION: RIGHT breast ultrasound-guided biopsy x3 I have discussed the findings and recommendations with the patient. The biopsy procedure was discussed with the patient and questions were answered. Patient expressed their understanding of the biopsy recommendation. Patient will be scheduled for biopsy at her earliest convenience by the schedulers. Ordering provider will be notified. If applicable, a reminder letter will be sent to the patient regarding the next appointment. BI-RADS CATEGORY  5: Highly suggestive of malignancy. Electronically Signed   By: Corean Salter M.D.   On: 06/17/2024 13:52   CT CHEST ABDOMEN PELVIS W CONTRAST Result Date: 06/12/2024 CLINICAL DATA:  Anal  squamous cell carcinoma.  * Tracking Code: BO * EXAM: CT CHEST, ABDOMEN, AND PELVIS WITH CONTRAST TECHNIQUE: Multidetector CT imaging of the chest, abdomen and pelvis was performed following the standard protocol during bolus administration of intravenous contrast. RADIATION DOSE REDUCTION: This exam was performed according to the departmental dose-optimization program which includes automated exposure control, adjustment of the mA and/or kV according to patient size and/or use of iterative reconstruction technique. CONTRAST:  OMNIPAQUE  IOHEXOL  300 MG/ML  SOLN COMPARISON:  CTA chest 11/27/2022. Abdominal ultrasound limited 08/02/2023. MRI abdomen 11/29/2022. FINDINGS: CT CHEST FINDINGS Cardiovascular: No significant vascular findings. Normal heart size. No pericardial effusion. Mediastinum/Nodes: No enlarged mediastinal, hilar, or axillary lymph nodes. Thyroid  gland, trachea, and esophagus demonstrate no significant findings. Lungs/Pleura: No dominant lung nodule. Lungs are clear. No pleural effusion or pneumothorax. Musculoskeletal: Mild scattered degenerative changes identified. Of note there is a nodular focus along the upper aspect of the right breast on series 2, image 13 measuring 14 mm. This was not clearly seen on previous CT scan. Please correlate with dedicated mammographic evaluation and possible ultrasound. CT ABDOMEN PELVIS FINDINGS Hepatobiliary: Fatty liver infiltration. There is a low-attenuation lesion identified posteriorly in segment 7. On prior MRI this was felt to be a hemangioma measured at that time 2.2 x 1.7 cm. Today 2.4 by 1.5 cm, not significantly changed. No other space-occupying liver lesion. Gallbladder is present. Patent portal vein. Pancreas: Unremarkable. No pancreatic ductal dilatation or surrounding inflammatory changes. Spleen: Normal in size without focal abnormality. Adrenals/Urinary Tract: Adrenal glands are unremarkable. Kidneys are normal, without renal calculi, focal  lesion, or hydronephrosis. Bladder is unremarkable. Stomach/Bowel: Slight nodular thickening in the area of the anal region. Please correlate with history of neoplasm. Otherwise large bowel has a normal course and caliber with scattered stool. Oral contrast was administered. Normal appendix. The stomach and small bowel are nondilated. Vascular/Lymphatic: Normal caliber aorta and  IVC. Abnormal lymph node identified in the left inguinal region. Lesion measures 19 by 17 mm on series 2, image 113. Other nodes in this location are small. There is a prominent right inguinal node on series 2, image 113 as well measuring 14 by 9 mm. Borderline size. No abnormal nodal enlargement otherwise along the retroperitoneum, pelvis or elsewhere. Reproductive: Left-sided ovarian cystic lesion identified measuring 2.6 cm. Uterus is present. Other: No free air or free fluid. Musculoskeletal: Mild curvature of the spine. Scattered degenerative changes. IMPRESSION: Slight thickening in the area of the anal canal. Please correlate for history of neoplasm. There is a pathologic enlarged left inguinal node. There is a borderline right-sided node. No additional areas of abnormal nodal enlargement or other aggressive appearing mass lesion at this time. Fatty liver infiltration with known segment 7 hepatic hemangioma. Of note there is a right-sided upper breast mass which was not clearly seen on prior CT scan. Please correlate for any prior study or if needed diagnostic mammographic evaluation and possible ultrasound when appropriate. Electronically Signed   By: Ranell Bring M.D.   On: 06/12/2024 10:49   DG Shoulder Left Result Date: 06/07/2024 CLINICAL DATA:  Left shoulder pain. EXAM: LEFT SHOULDER - 2+ VIEW COMPARISON:  Shoulder radiograph dated 06/10/2023. FINDINGS: There is no evidence of fracture or dislocation. There is no evidence of arthropathy or other focal bone abnormality. Soft tissues are unremarkable. IMPRESSION: Negative.  Electronically Signed   By: Vanetta Chou M.D.   On: 06/07/2024 14:50

## 2024-06-26 NOTE — Assessment & Plan Note (Addendum)
 CT imaging findings inguinal lymph node biopsy pathology results were reviewed and discussed with patient. Left inguinal nodal involvement upgrade staging to stage III, cT3 N1a.  Per IR, right inguinal lymph node is not enlarged. Pending PET scan evaluation. Recommend concurrent chemotherapy 5-FU and mitomycin C with Radiation.  Now she has a second primary breast cancer, see below. Case will be presented on tumor board.

## 2024-06-27 ENCOUNTER — Telehealth: Payer: Self-pay

## 2024-06-27 ENCOUNTER — Encounter: Payer: Self-pay | Admitting: *Deleted

## 2024-06-27 ENCOUNTER — Inpatient Hospital Stay

## 2024-06-27 ENCOUNTER — Encounter: Admission: RE | Payer: Self-pay | Source: Home / Self Care

## 2024-06-27 ENCOUNTER — Ambulatory Visit: Admission: RE | Admit: 2024-06-27 | Source: Home / Self Care | Admitting: Surgery

## 2024-06-27 ENCOUNTER — Ambulatory Visit: Payer: Self-pay | Admitting: Surgery

## 2024-06-27 DIAGNOSIS — C50919 Malignant neoplasm of unspecified site of unspecified female breast: Secondary | ICD-10-CM

## 2024-06-27 SURGERY — INSERTION, TUNNELED CENTRAL VENOUS DEVICE, WITH PORT
Anesthesia: General | Site: Chest

## 2024-06-27 NOTE — Telephone Encounter (Signed)
 Clinical Social Work was referred by Shasta Ada, the RN Navigator, for assessment of psychosocial needs.  CSW attempted to contact patient by phone.  Left voicemail with contact information and request for return call.

## 2024-06-27 NOTE — Progress Notes (Signed)
 Updated Monica Gonzalez on plan for CT simulation to be about 1 1/2 weeks after surgery.   She is also interested in genetic testing, referral has been placed.

## 2024-06-27 NOTE — H&P (View-Only) (Signed)
 Subjective:    CC: Malignant neoplasm of upper-outer quadrant of right female breast, unspecified estrogen receptor status (CMS/HHS-HCC) [C50.411] HPI:  referred by [name], MD for evaluation of above. Change was noted on CT for anal CA mets workup.  Subsequent mammo bx results noted below.  Pt has not noticed the lump before.  Never had mammo recommended to her either.   History of Present Illness   G5P3.  Menarch around 10.  First born at 83, did not breastfeed.  LMP. No FH of breast related issues     Past Medical History:  has a past medical history of Acute stomach ulcer, Anemia, Anxiety, Asthma (HHS-HCC), Bronchitis, chronic (CMS/HHS-HCC), GERD (gastroesophageal reflux disease), Heart murmur, Hiatal hernia, History of cancer, History of pneumonia, Kidney stones, Lupus, and Vulvar intraepithelial neoplasia (VIN) grade 3.   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   Family History: family history includes Asthma in her mother; Atrial fibrillation (Abnormal heart rhythm sometimes requiring treatment with blood thinners) in her maternal grandmother; Autoimmune disease in her maternal grandmother; COPD in her mother; Cancer in her paternal grandfather; Clotting disorder in her maternal grandmother; Coronary Artery Disease (Blocked arteries around heart) in her father and mother; Diabetes type II in her mother; Heart failure in her maternal grandmother; High blood pressure (Hypertension) in her maternal grandmother; Kidney failure in her mother; Lupus in her maternal grandmother; Myocardial Infarction (Heart attack) in her mother; No Known Problems in her half-brother and half-sister; Prostate cancer in her maternal grandfather.   Social History:  reports that she quit  smoking about 22 years ago. Her smoking use included cigarettes. She has never used smokeless tobacco. She reports current alcohol use. She reports that she does not use drugs.   Current Medications: has a current medication list which includes the following prescription(s): buspirone, calcium carbonate, celecoxib, clonazepam, cyclobenzaprine , duloxetine, hydrocodone -acetaminophen , ipratropium-albuterol , lidocaine , linaclotide, mag hydrox/aluminum hyd/simeth, polyethylene glycol, promethazine -dextromethorphan, sumatriptan, tranexamic acid, trazodone, albuterol , albuterol  mdi (proventil , ventolin , proair ) hfa, and gabapentin.   Allergies:       Allergies as of 06/24/2024 - Reviewed 06/24/2024  Allergen Reaction Noted   Prednisone Swelling and Anaphylaxis 05/03/2015      ROS:  A 15 point review of systems was performed and was negative except as noted in HPI   Objective:    Objective BP 109/75   Pulse 62   Ht 149.9 cm (4' 11)   Wt 88 kg (194 lb)   BMI 39.18 kg/m    Constitutional :  No distress, cooperative, alert  Lymphatics/Throat:  Supple with no lymphadenopathy  Respiratory:  Clear to auscultation bilaterally  Cardiovascular:  Regular rate and rhythm  Gastrointestinal: Soft, non-tender, non-distended, no organomegaly.  Musculoskeletal: Steady gait and movement  Skin: Cool and moist  Psychiatric: Normal affect, non-agitated, not confused  Breast: Normal appearance and no palpable abnormality in left breast and axilla.  Post bx changes noted in right with bruising, subtly palpable lump in area of concern. No right axillary lymphadenopathy. Chaperone present for exam.      LABS:  SURGICAL PATHOLOGY SURGICAL PATHOLOGY [facility] [address], [address] [place], [place] [address] Telephone [phone] or [phone] Fax [phone]  REPORT OF SURGICAL PATHOLOGY   Accession #: [id number] Patient Name: Monica Gonzalez, Monica Gonzalez Visit # :  [id number]  MRN:  [id number] Physician: Vanice Ozell Revel DOB/Age 06/02/1976 (Age: 48) Gender: F Collected Date: 06/19/2024 Received Date: 06/19/2024  FINAL DIAGNOSIS       1. Lymph node, biopsy, left inguinal adenopathy :      -  METASTATIC MODERATELY DIFFERENTIATED SQUAMOUS CELL CARCINOMA.      NOTE: DR. SHARMA HAS PEER REVIEWED THE CASE AND AGREES WITH THE INTERPRETATION.       DATE SIGNED OUT: 06/20/2024 ELECTRONIC SIGNATURE : Legolvan Do, Mark, Pathologist, Electronic Signature  MICROSCOPIC DESCRIPTION  CASE COMMENTS STAINS USED IN DIAGNOSIS: H&E    CLINICAL HISTORY  SPECIMEN(S) OBTAINED 1. Lymph node, biopsy, Left Inguinal Adenopathy  SPECIMEN COMMENTS: SPECIMEN CLINICAL INFORMATION: 1. MET DZ.  Anal    Gross Description 1. Received in formalin are multiple fragments of pink-tan soft tissue aggregating to 1.9 x 0.1 x 0.1 cm submitted entirely in 1 block. mb 06-19-24        Report signed out from the following location(s) [facility] [address] [place], [place] [address] CLIA #: [id number]  [facility] [address] [place], [place] [address] CLIA #: [id number]    SURGICAL PATHOLOGY SURGICAL PATHOLOGY [facility] [address] [place], [place] [address] Telephone [phone] or [phone] Fax [phone]  REPORT OF SURGICAL PATHOLOGY   Accession #: [id number] Patient Name: Monica Gonzalez, Monica Gonzalez Visit # : [id number]  MRN: [id number] Physician: Correne Krabbe DOB/Age Apr 03, 1976 (Age: 48) Gender: F Collected Date: 06/18/2024 Received Date: 06/18/2024  FINAL DIAGNOSIS       1. Breast, right, needle core biopsy, 12:00 12cmfn (heart clip) :       INVASIVE DUCTAL CARCINOMA      TUBULE FORMATION: SCORE 3      NUCLEAR PLEOMORPHISM: SCORE 3      MITOTIC COUNT: SCORE 2      TOTAL SCORE: 8      OVERALL GRADE: 3      LYMPHOVASCULAR INVASION: NOT IDENTIFIED      CANCER LENGTH: 1.0 CM       CALCIFICATIONS: NOT IDENTIFIED      OTHER FINDINGS: NONE      SEE NOTE       2. Breast, right, needle core biopsy, 12:00 8cmfn (coil clip) :      INVASIVE DUCTAL CARCINOMA      DUCTAL CARCINOMA IN SITU, SOLID, HIGH NUCLEAR GRADE WITH NECROSIS      TUBULE FORMATION: SCORE 3      NUCLEAR PLEOMORPHISM: SCORE 3      MITOTIC COUNT: SCORE 2      TOTAL SCORE: 8      OVERALL GRADE: 3      LYMPHOVASCULAR INVASION: NOT IDENTIFIED      CANCER LENGTH: 0.3 CM      CALCIFICATIONS: NOT IDENTIFIED      OTHER FINDINGS: NONE      SEE NOTE       3. Breast, right, needle core biopsy, 9:00 12cmfn (venus) :      BENIGN BREAST TISSUE WITH DENSE STROMAL FIBROSIS.      NEGATIVE FOR ATYPIA OR MALIGNANCY.       Diagnosis Note :      DR. Wolford REVIEWED THE CASE AND CONCURS WITH THE INTERPRETATION.  A BREAST      PROGNOSTIC PROFILE (ER AND PR) IS PENDING AND WILL BE REPORTED IN AN ADDENDUM.      Rock Hover WAS NOTIFIED ON 06/19/2024.  ELECTRONIC SIGNATURE : Pepper Dutton Md, Pathologist, Electronic Signature  MICROSCOPIC DESCRIPTION  CASE COMMENTS STAINS USED IN DIAGNOSIS: H&E-2 H&E-3 H&E-4 H&E *RECUT 1 SLIDE H&E-2 H&E-3 H&E-4 H&E H&E-2 H&E-3 H&E-4 H&E Stains used in diagnosis 1 Her2 by IHC, 1 ER-ACIS, 1 KI-67-ACIS, 1 PR-ACIS IHC scores are reported using ASCO/CAP scoring criteria.  An IHC Score of 0 or 1+  is NEGATIVE for HER2, 3+ is POSITIVE for HER2, and 2+ is EQUIVOCAL. Equivocal results are reflexed to either FISH or IHC testing. Specimens are fixed in 10% Neutral Buffered Formalin for at least 6 hours and up to 72 hours. These tests have not be validated on decalcified tissue.  Results should be interpreted with caution given the possibility of false negative results on decalcified specimens. Antibody Clone for HER2 is 4B5 (PATHWAY). Some of these immunohistochemical stains may have been developed and the performance characteristics determined by [facility].  Some  may not have been cleared or approved by the U.S. Food and Drug Administration.  The FDA has determined that such clearance or approval is not necessary.  This test is used for clinical purposes.  It should not be regarded as investigational or for research.  This laboratory is certified under the Clinical Laboratory Improvement Amendments of 1988 (CLIA-88) as qualified to perform high complexity clinical laboratory testing. Estrogen receptor (6F11), immunohistochemical stains are performed on formalin fixed, paraffin embedded tissue using a 3,3-diaminobenzidine (DAB) chromogen and Leica Bond Autostainer System.  The staining intensity of the nucleus is scored manually and is reported as the percentage of tumor cell nuclei demonstrating specific nuclear staining.Specimens are fixed in 10% Neutral Buffered Formalin for at least 6 hours and up to 72 hours.  These tests have not be validated on decalcified tissue.  Results should be interpreted with caution given the possibility of false negative results on decalcified specimens. Ki-67 (MM1), immunohistochemical stains are performed on formalin fixed, paraffin embedded tissue using a 3,3-diaminobenzidine (DAB) chromogen and Leica Bond Autostainer System.  The staining intensity of the nucleus is scored manually and is reported as the percentage of tumor cell nuclei demonstrating specific nuclear staining.Specimens are fixed in 10% Neutral Buffered Formalin for at least 6 hours and up to 72 hours. These tests have not be validated on decalcified tissue.  Results should be interpreted with caution given the possibility of false negative results on decalcified specimens. PR progesterone receptor (16), immunohistochemical stains are performed on formalin fixed, paraffin embedded tissue using a 3,3-diaminobenzidine (DAB) chromogen and Leica Bond Autostainer System.  The staining intensity of the nucleus is scored manually and is reported as the  percentage of tumor cell nuclei demonstrating specific nuclear staining.Specimens are fixed in 10% Neutral Buffered Formalin for at least 6 hours and up to 72 hours. These tests have not be validated on decalcified tissue.  Results should be interpreted with caution given the possibility of false negative results on decalcified specimens.  ADDENDUM 1A) Breast, right, needle core biopsy, 12:00 12cmfn (heart clip) PROGNOSTIC INDICATORS  Results: IMMUNOHISTOCHEMICAL AND MORPHOMETRIC ANALYSIS PERFORMED MANUALLY 240The tumor cells are NEGATIVE for Her2 (0). Estrogen Receptor:  0%, NEGATIVE Progesterone Receptor:  0%, NEGATIVE Proliferation Marker Ki67:  95% COMMENT:  The negative hormone receptor study(ies) in this case has no internal positive control.  REFERENCE RANGE ESTROGEN RECEPTOR NEGATIVE     0% POSITIVE       =>1% REFERENCE RANGE PROGESTERONE RECEPTOR NEGATIVE     0% POSITIVE        =>1% All  controls stained appropriately Belvie Come, Norleen, Pathologist, Electronic Signature [id number] [id number])   CLINICAL HISTORY  SPECIMEN(S) OBTAINED 1. Breast, right, needle core biopsy, 12:00 12cmfn (heart Clip) 2. Breast, right, needle core biopsy, 12:00 8cmfn (coil Clip) 3. Breast, right, needle core biopsy, 9:00 12cmfn (venus)  SPECIMEN COMMENTS: 1. TIF: 14:18pm, CIT: less than 30 sec 2. TIF: 14:21pm, CIT: less than 30 sec 3. TIF: 14:37pm, CIT: Less than 30 sec SPECIMEN CLINICAL INFORMATION: 1. Suspicioius right breast mass with calcs (site 1) sonographically indentified areas in right breast (site2 and 3)    Gross Description 1. Received in formalin labeled with the patient's name right breast 12 o'clock, 12 cm from nipple, and consists of multiple core and core fragments of tan yellow fibroadipose tissue, ranging from 0.2 cm to 1.5 x 0.2 x 0.2 cm.The specimen is entirely submitted in one cassette.      TIF 2:18 p.m. on 06/18/24. CIT less than 30 seconds 2. Received in  formalin labeled with the patient's name and right breast 12 o'clock, 8 cm from nipple and consists of multiple core and core fragments of tan pink fibroadipose tissue, ranging from 0.1 cm to 1.2 x 0.2 x 0.2 cm.The specimen is entirely submitted in one cassette.      TIF 2:21 p.m. on 06/18/24.  CIT less than 30 seconds 3. Received in formalin labeled with the patient's name and right breast 9 o'clock, 12 cm from nipple and consists of multiple core and core fragment of tan yellow fibroadipose tissue, ranging from 0.1 cm to 1.3 x 0.2 x 0.2 cm.The specimen is entirely submitted in one cassette.      TIF 2:37 p.m. on 06/18/24.  CIT less than 30 seconds.  (KL:gt, 06/19/24)        Report signed out from the following location(s) [place] [address] [place], [place] [address] CLIA #: [id number]  [facility] [address] [place], [place] [address] CLIA #: [id number]      RADS: ADDENDUM REPORT: 06/19/2024 14:32   ADDENDUM: PATHOLOGY revealed: Site 1. 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   Pathology results are CONCORDANT with imaging findings, per Corean Salter M.D.   PATHOLOGY revealed: Site 2. Breast, right, needle core biopsy, 12:00; 8 cmfn (coil clip) : INVASIVE DUCTAL CARCINOMA - DUCTAL CARCINOMA IN SITU, SOLID, HIGH NUCLEAR GRADE WITH NECROSIS. OVERALL GRADE: 3 -   LYMPHOVASCULAR INVASION: NOT IDENTIFIED. CANCER LENGTH: 0.3 CM. CALCIFICATIONS: NOT IDENTIFIED   Pathology results are CONCORDANT with imaging findings, per Corean Salter M.D.   PATHOLOGY revealed: Site 3. Breast, right, needle core biopsy, 9:00; 12 cmfn (venus) : BENIGN BREAST TISSUE WITH DENSE STROMAL FIBROSIS. NEGATIVE FOR ATYPIA OR MALIGNANCY.   Pathology results are CONCORDANT with  imaging findings, per Corean Salter M.D.   Pathology results and recommendations below were discussed with patient by telephone on 06/19/2024 by Rock Hover RN. Patient reported biopsy site within normal limits with slight tenderness at the site. Post biopsy care instructions were reviewed, questions were answered and my direct phone number was provided to patient. Patient was instructed to call [facility] if any concerns or questions arise related to the biopsy.   RECOMMENDATIONS: 1. Surgical and oncological consultation. Request for surgical and oncological consultation relayed to Shasta Ada RN at [facility]  by Rock Hover RN on 06/19/2024.   2. Recommend pretreatment bilateral breast MRI with and without contrast to assess extent of breast disease, given the patient's breast density and age.   3. The 2 sites of biopsy-proven malignancy (COIL and heart clip) are approximately 4.7 cm apart.   Pathology results reported by Rock Hover RN on 06/19/2024.     Electronically Signed   By: Corean Salter M.D.   On: 06/19/2024 14:32   CLINICAL DATA:  Patient with recently diagnosed anal cancer. On staging examination, a mass was noted in the RIGHT upper breast.   EXAM: DIGITAL DIAGNOSTIC BILATERAL MAMMOGRAM WITH TOMOSYNTHESIS AND CAD; ULTRASOUND RIGHT BREAST LIMITED   TECHNIQUE: Bilateral digital diagnostic mammography and breast tomosynthesis was performed. The images were evaluated with computer-aided detection. ; Targeted ultrasound examination of the right breast was performed   COMPARISON:  None available.   ACR Breast Density Category c: The breasts are heterogeneously dense, which may obscure small masses.   FINDINGS: Diagnostic images of the RIGHT breast demonstrate an irregular mass with associated pleomorphic calcifications in the upper breast at middle depth. Mass is estimated to span 17 mm. Associated calcifications are estimated  to span approximately 17 mm.   A superficial oval mass noted in the RIGHT upper outer breast on true lateral slice 57 with possible internal fat density. No suspicious mass, distortion, or microcalcifications are identified to suggest presence of malignancy in the LEFT breast.   On physical exam, there is a hard mass in the upper breast.   Targeted ultrasound was performed of the upper breast. There is an irregular hypoechoic mass at 12 o'clock 12 cm from the nipple with indistinct margins. It demonstrates multiple associated echogenic foci and posterior acoustic shadowing. It spans approximately 15 x 11 x 12 mm and corresponds to the site of mammographic concern.   Targeted ultrasound was performed of the RIGHT axilla. No suspicious axillary lymph nodes are visualized.   Targeted ultrasound was performed of the RIGHT upper outer breast. There is incidental sonographic note of an oval hypoechoic mass with relatively circumscribed margins at 12 o'clock 8 cm from the nipple. This measures 7 x 4 by 6 mm. It is located approximately 3.5 cm from the margin of the dominant mass.   There is incidental sonographic note of a hypoechoic non mass area at 9 o'clock 12 cm from the nipple. This measures approximately 6 x 3 x 5 mm.   At 10 o'clock 10 cm from the nipple, there is a superficial oval anechoic mass with posterior acoustic shadowing and suggestion of a mild adjacent echogenic rind. This measures 3 by 4 x 3 mm. BB was placed at the site and repeat spot tangential imaging was performed. This demonstrates a small amount of internal fat density (spot tangential slice 34) at this site. This corresponds to the superficial mass noted mammographically and is favored to reflect benign fat necrosis.   IMPRESSION: 1. There is a suspicious 17 mm mass at the site of CT and mammographic concern in the RIGHT breast. It demonstrates associated pleomorphic calcifications. Recommend  ultrasound-guided biopsy for definitive characterization. 2. There is incidental sonographic note of a 7 mm mass in the RIGHT breast at 12 o'clock and a 6 mm non mass area at 9 o'clock. Recommend ultrasound-guided biopsy of these 2 areas for definitive characterization given suspicious appearance of the dominant mass. 3. No suspicious RIGHT axillary adenopathy. 4. No mammographic evidence of malignancy in the LEFT breast.   RECOMMENDATION:  RIGHT breast ultrasound-guided biopsy x3   I have discussed the findings and recommendations with the patient. The biopsy procedure was discussed with the patient and questions were answered. Patient expressed their understanding of the biopsy recommendation. Patient will be scheduled for biopsy at her earliest convenience by the schedulers. Ordering provider will be notified. If applicable, a reminder letter will be sent to the patient regarding the next appointment.   BI-RADS CATEGORY  5: Highly suggestive of malignancy.     Electronically Signed   By: Corean Salter M.D.   On: 06/17/2024 13:52   CLINICAL DATA:  Anal squamous cell carcinoma.  * Tracking Code: BO *   EXAM: CT CHEST, ABDOMEN, AND PELVIS WITH CONTRAST   TECHNIQUE: Multidetector CT imaging of the chest, abdomen and pelvis was performed following the standard protocol during bolus administration of intravenous contrast.   RADIATION DOSE REDUCTION: This exam was performed according to the departmental dose-optimization program which includes automated exposure control, adjustment of the mA and/or kV according to patient size and/or use of iterative reconstruction technique.   CONTRAST:  OMNIPAQUE  IOHEXOL  300 MG/ML  SOLN   COMPARISON:  CTA chest 11/27/2022. Abdominal ultrasound limited 08/02/2023. MRI abdomen 11/29/2022.   FINDINGS: CT CHEST FINDINGS   Cardiovascular: No significant vascular findings. Normal heart size. No pericardial effusion.    Mediastinum/Nodes: No enlarged mediastinal, hilar, or axillary lymph nodes. Thyroid  gland, trachea, and esophagus demonstrate no significant findings.   Lungs/Pleura: No dominant lung nodule. Lungs are clear. No pleural effusion or pneumothorax.   Musculoskeletal: Mild scattered degenerative changes identified. Of note there is a nodular focus along the upper aspect of the right breast on series 2, image 13 measuring 14 mm. This was not clearly seen on previous CT scan. Please correlate with dedicated mammographic evaluation and possible ultrasound.   CT ABDOMEN PELVIS FINDINGS   Hepatobiliary: Fatty liver infiltration. There is a low-attenuation lesion identified posteriorly in segment 7. On prior MRI this was felt to be a hemangioma measured at that time 2.2 x 1.7 cm. Today 2.4 by 1.5 cm, not significantly changed. No other space-occupying liver lesion. Gallbladder is present. Patent portal vein.   Pancreas: Unremarkable. No pancreatic ductal dilatation or surrounding inflammatory changes.   Spleen: Normal in size without focal abnormality.   Adrenals/Urinary Tract: Adrenal glands are unremarkable. Kidneys are normal, without renal calculi, focal lesion, or hydronephrosis. Bladder is unremarkable.   Stomach/Bowel: Slight nodular thickening in the area of the anal region. Please correlate with history of neoplasm. Otherwise large bowel has a normal course and caliber with scattered stool. Oral contrast was administered. Normal appendix. The stomach and small bowel are nondilated.   Vascular/Lymphatic: Normal caliber aorta and IVC.   Abnormal lymph node identified in the left inguinal region. Lesion measures 19 by 17 mm on series 2, image 113. Other nodes in this location are small. There is a prominent right inguinal node on series 2, image 113 as well measuring 14 by 9 mm. Borderline size. No abnormal nodal enlargement otherwise along the retroperitoneum, pelvis or  elsewhere.   Reproductive: Left-sided ovarian cystic lesion identified measuring 2.6 cm. Uterus is present.   Other: No free air or free fluid.   Musculoskeletal: Mild curvature of the spine. Scattered degenerative changes.   IMPRESSION: Slight thickening in the area of the anal canal. Please correlate for history of neoplasm. There is a pathologic enlarged left inguinal node. There is a borderline right-sided node.   No additional areas of abnormal  nodal enlargement or other aggressive appearing mass lesion at this time.   Fatty liver infiltration with known segment 7 hepatic hemangioma.   Of note there is a right-sided upper breast mass which was not clearly seen on prior CT scan. Please correlate for any prior study or if needed diagnostic mammographic evaluation and possible ultrasound when appropriate.     Electronically Signed   By: Ranell Bring M.D.   On: 06/12/2024 10:49       Assessment:    Malignant neoplasm of upper-outer quadrant of right female breast, unspecified estrogen receptor status (CMS/HHS-HCC) [C50.411]   Plan:    Plan 1. Malignant neoplasm of upper-outer quadrant of right female breast, unspecified estrogen receptor status (CMS/HHS-HCC) [C50.411]  Discussed the risk of surgery including recurrence, chronic pain, post-op infxn, poor/delayed wound healing, poor cosmesis, seroma, hematoma formation, and possible re-operation to address said risks. The risks of general anesthetic, if used, includes MI, CVA, sudden death or even reaction to anesthetic medications also discussed.  Typical post-op recovery time and possbility of activity restrictions were also discussed.  Alternatives include continued observation.  Benefits include possible symptom relief, pathologic evaluation, and/or curative excision.    The patient verbalized understanding and all questions were answered to the patient's satisfaction.   2. Right breast cancer- will likely proceed with  breast lumpectomy and sentinel lymph node biopsy, assuming additional imaging studies for breast is negative.  Will likely place port at same time of breast surgery, as long as rest of team is ok to delay port placement that was originally scheduled for this week.   Anal cancer- still recommend port placement to start chemotherapy as originally recommended.   Ana or our office will keep you updated with additional recommendations labs/images/medications/previous chart entries reviewed personally and relevant changes/updates noted above.

## 2024-06-27 NOTE — H&P (Signed)
 Subjective:    CC: Malignant neoplasm of upper-outer quadrant of right female breast, unspecified estrogen receptor status (CMS/HHS-HCC) [C50.411] HPI:  referred by [name], MD for evaluation of above. Change was noted on CT for anal CA mets workup.  Subsequent mammo bx results noted below.  Pt has not noticed the lump before.  Never had mammo recommended to her either.   History of Present Illness   G5P3.  Menarch around 10.  First born at 83, did not breastfeed.  LMP. No FH of breast related issues     Past Medical History:  has a past medical history of Acute stomach ulcer, Anemia, Anxiety, Asthma (HHS-HCC), Bronchitis, chronic (CMS/HHS-HCC), GERD (gastroesophageal reflux disease), Heart murmur, Hiatal hernia, History of cancer, History of pneumonia, Kidney stones, Lupus, and Vulvar intraepithelial neoplasia (VIN) grade 3.   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   Family History: family history includes Asthma in her mother; Atrial fibrillation (Abnormal heart rhythm sometimes requiring treatment with blood thinners) in her maternal grandmother; Autoimmune disease in her maternal grandmother; COPD in her mother; Cancer in her paternal grandfather; Clotting disorder in her maternal grandmother; Coronary Artery Disease (Blocked arteries around heart) in her father and mother; Diabetes type II in her mother; Heart failure in her maternal grandmother; High blood pressure (Hypertension) in her maternal grandmother; Kidney failure in her mother; Lupus in her maternal grandmother; Myocardial Infarction (Heart attack) in her mother; No Known Problems in her half-brother and half-sister; Prostate cancer in her maternal grandfather.   Social History:  reports that she quit  smoking about 22 years ago. Her smoking use included cigarettes. She has never used smokeless tobacco. She reports current alcohol use. She reports that she does not use drugs.   Current Medications: has a current medication list which includes the following prescription(s): buspirone, calcium carbonate, celecoxib, clonazepam, cyclobenzaprine , duloxetine, hydrocodone -acetaminophen , ipratropium-albuterol , lidocaine , linaclotide, mag hydrox/aluminum hyd/simeth, polyethylene glycol, promethazine -dextromethorphan, sumatriptan, tranexamic acid, trazodone, albuterol , albuterol  mdi (proventil , ventolin , proair ) hfa, and gabapentin.   Allergies:       Allergies as of 06/24/2024 - Reviewed 06/24/2024  Allergen Reaction Noted   Prednisone Swelling and Anaphylaxis 05/03/2015      ROS:  A 15 point review of systems was performed and was negative except as noted in HPI   Objective:    Objective BP 109/75   Pulse 62   Ht 149.9 cm (4' 11)   Wt 88 kg (194 lb)   BMI 39.18 kg/m    Constitutional :  No distress, cooperative, alert  Lymphatics/Throat:  Supple with no lymphadenopathy  Respiratory:  Clear to auscultation bilaterally  Cardiovascular:  Regular rate and rhythm  Gastrointestinal: Soft, non-tender, non-distended, no organomegaly.  Musculoskeletal: Steady gait and movement  Skin: Cool and moist  Psychiatric: Normal affect, non-agitated, not confused  Breast: Normal appearance and no palpable abnormality in left breast and axilla.  Post bx changes noted in right with bruising, subtly palpable lump in area of concern. No right axillary lymphadenopathy. Chaperone present for exam.      LABS:  SURGICAL PATHOLOGY SURGICAL PATHOLOGY [facility] [address] [place], [place] [address] Telephone [phone] or [phone] Fax [phone]  REPORT OF SURGICAL PATHOLOGY   Accession #: [id number] Patient Name: Monica Gonzalez, Monica Gonzalez Visit # :  [id number]  MRN:  [id number] Physician: Vanice Ozell Revel DOB/Age Jul 30, 1976 (Age: 72) Gender: F Collected Date: 06/19/2024 Received Date: 06/19/2024  FINAL DIAGNOSIS       1. Lymph node, biopsy, left inguinal adenopathy :      -  METASTATIC MODERATELY DIFFERENTIATED SQUAMOUS CELL CARCINOMA.      NOTE: DR. SHARMA HAS PEER REVIEWED THE CASE AND AGREES WITH THE INTERPRETATION.       DATE SIGNED OUT: 06/20/2024 ELECTRONIC SIGNATURE : Legolvan Do, Mark, Pathologist, Electronic Signature  MICROSCOPIC DESCRIPTION  CASE COMMENTS STAINS USED IN DIAGNOSIS: H&E    CLINICAL HISTORY  SPECIMEN(S) OBTAINED 1. Lymph node, biopsy, Left Inguinal Adenopathy  SPECIMEN COMMENTS: SPECIMEN CLINICAL INFORMATION: 1. MET DZ.  Anal    Gross Description 1. Received in formalin are multiple fragments of pink-tan soft tissue aggregating to 1.9 x 0.1 x 0.1 cm submitted entirely in 1 block. mb 06-19-24        Report signed out from the following location(s) [place] [address] [place], [place] [address] CLIA #: [id number]  [facility] [address] [place], [place] [address] CLIA #: [id number]    SURGICAL PATHOLOGY SURGICAL PATHOLOGY [facility] [address], [address] [place], [place] [address] Telephone [phone] or [phone] Fax [phone]  REPORT OF SURGICAL PATHOLOGY   Accession #: [id number] Patient Name: Monica Gonzalez, Monica Gonzalez Visit # : [id number]  MRN: [id number] Physician: Correne Krabbe DOB/Age [birth year] (Age: 48) Gender: F Collected Date: 06/18/2024 Received Date: 06/18/2024  FINAL DIAGNOSIS       1. Breast, right, needle core biopsy, 12:00 12cmfn (heart clip) :       INVASIVE DUCTAL CARCINOMA      TUBULE FORMATION: SCORE 3      NUCLEAR PLEOMORPHISM: SCORE 3      MITOTIC COUNT: SCORE 2      TOTAL SCORE: 8      OVERALL GRADE: 3      LYMPHOVASCULAR INVASION: NOT IDENTIFIED      CANCER LENGTH: 1.0 CM       CALCIFICATIONS: NOT IDENTIFIED      OTHER FINDINGS: NONE      SEE NOTE       2. Breast, right, needle core biopsy, 12:00 8cmfn (coil clip) :      INVASIVE DUCTAL CARCINOMA      DUCTAL CARCINOMA IN SITU, SOLID, HIGH NUCLEAR GRADE WITH NECROSIS      TUBULE FORMATION: SCORE 3      NUCLEAR PLEOMORPHISM: SCORE 3      MITOTIC COUNT: SCORE 2      TOTAL SCORE: 8      OVERALL GRADE: 3      LYMPHOVASCULAR INVASION: NOT IDENTIFIED      CANCER LENGTH: 0.3 CM      CALCIFICATIONS: NOT IDENTIFIED      OTHER FINDINGS: NONE      SEE NOTE       3. Breast, right, needle core biopsy, 9:00 12cmfn (venus) :      BENIGN BREAST TISSUE WITH DENSE STROMAL FIBROSIS.      NEGATIVE FOR ATYPIA OR MALIGNANCY.       Diagnosis Note :      DR. Wolford REVIEWED THE CASE AND CONCURS WITH THE INTERPRETATION.  A BREAST      PROGNOSTIC PROFILE (ER AND PR) IS PENDING AND WILL BE REPORTED IN AN ADDENDUM.      Rock Hover WAS NOTIFIED ON 06/19/2024.  ELECTRONIC SIGNATURE : Pepper Dutton Md, Pathologist, Electronic Signature  MICROSCOPIC DESCRIPTION  CASE COMMENTS STAINS USED IN DIAGNOSIS: H&E-2 H&E-3 H&E-4 H&E *RECUT 1 SLIDE H&E-2 H&E-3 H&E-4 H&E H&E-2 H&E-3 H&E-4 H&E Stains used in diagnosis 1 Her2 by IHC, 1 ER-ACIS, 1 KI-67-ACIS, 1 PR-ACIS IHC scores are reported using ASCO/CAP scoring criteria.  An IHC Score of 0 or 1+  is NEGATIVE for HER2, 3+ is POSITIVE for HER2, and 2+ is EQUIVOCAL. Equivocal results are reflexed to either FISH or IHC testing. Specimens are fixed in 10% Neutral Buffered Formalin for at least 6 hours and up to 72 hours. These tests have not be validated on decalcified tissue.  Results should be interpreted with caution given the possibility of false negative results on decalcified specimens. Antibody Clone for HER2 is 4B5 (PATHWAY). Some of these immunohistochemical stains may have been developed and the performance characteristics determined by [facility].  Some  may not have been cleared or approved by the U.S. Food and Drug Administration.  The FDA has determined that such clearance or approval is not necessary.  This test is used for clinical purposes.  It should not be regarded as investigational or for research.  This laboratory is certified under the Clinical Laboratory Improvement Amendments of 1988 (CLIA-88) as qualified to perform high complexity clinical laboratory testing. Estrogen receptor (6F11), immunohistochemical stains are performed on formalin fixed, paraffin embedded tissue using a 3,3-diaminobenzidine (DAB) chromogen and Leica Bond Autostainer System.  The staining intensity of the nucleus is scored manually and is reported as the percentage of tumor cell nuclei demonstrating specific nuclear staining.Specimens are fixed in 10% Neutral Buffered Formalin for at least 6 hours and up to 72 hours.  These tests have not be validated on decalcified tissue.  Results should be interpreted with caution given the possibility of false negative results on decalcified specimens. Ki-67 (MM1), immunohistochemical stains are performed on formalin fixed, paraffin embedded tissue using a 3,3-diaminobenzidine (DAB) chromogen and Leica Bond Autostainer System.  The staining intensity of the nucleus is scored manually and is reported as the percentage of tumor cell nuclei demonstrating specific nuclear staining.Specimens are fixed in 10% Neutral Buffered Formalin for at least 6 hours and up to 72 hours. These tests have not be validated on decalcified tissue.  Results should be interpreted with caution given the possibility of false negative results on decalcified specimens. PR progesterone receptor (16), immunohistochemical stains are performed on formalin fixed, paraffin embedded tissue using a 3,3-diaminobenzidine (DAB) chromogen and Leica Bond Autostainer System.  The staining intensity of the nucleus is scored manually and is reported as the  percentage of tumor cell nuclei demonstrating specific nuclear staining.Specimens are fixed in 10% Neutral Buffered Formalin for at least 6 hours and up to 72 hours. These tests have not be validated on decalcified tissue.  Results should be interpreted with caution given the possibility of false negative results on decalcified specimens.  ADDENDUM 1A) Breast, right, needle core biopsy, 12:00 12cmfn (heart clip) PROGNOSTIC INDICATORS  Results: IMMUNOHISTOCHEMICAL AND MORPHOMETRIC ANALYSIS PERFORMED MANUALLY 240The tumor cells are NEGATIVE for Her2 (0). Estrogen Receptor:  0%, NEGATIVE Progesterone Receptor:  0%, NEGATIVE Proliferation Marker Ki67:  95% COMMENT:  The negative hormone receptor study(ies) in this case has no internal positive control.  REFERENCE RANGE ESTROGEN RECEPTOR NEGATIVE     0% POSITIVE       =>1% REFERENCE RANGE PROGESTERONE RECEPTOR NEGATIVE     0% POSITIVE        =>1% All  controls stained appropriately Belvie Come, Norleen, Pathologist, Electronic Signature [phone] [phone])   CLINICAL HISTORY  SPECIMEN(S) OBTAINED 1. Breast, right, needle core biopsy, 12:00 12cmfn (heart Clip) 2. Breast, right, needle core biopsy, 12:00 8cmfn (coil Clip) 3. Breast, right, needle core biopsy, 9:00 12cmfn (venus)  SPECIMEN COMMENTS: 1. TIF: 14:18pm, CIT: less than 30 sec 2. TIF: 14:21pm, CIT: less than 30 sec 3. TIF: 14:37pm, CIT: Less than 30 sec SPECIMEN CLINICAL INFORMATION: 1. Suspicioius right breast mass with calcs (site 1) sonographically indentified areas in right breast (site2 and 3)    Gross Description 1. Received in formalin labeled with the patient's name right breast 12 o'clock, 12 cm from nipple, and consists of multiple core and core fragments of tan yellow fibroadipose tissue, ranging from 0.2 cm to 1.5 x 0.2 x 0.2 cm.The specimen is entirely submitted in one cassette.      TIF 2:18 p.m. on 06/18/24. CIT less than 30 seconds 2. Received in  formalin labeled with the patient's name and right breast 12 o'clock, 8 cm from nipple and consists of multiple core and core fragments of tan pink fibroadipose tissue, ranging from 0.1 cm to 1.2 x 0.2 x 0.2 cm.The specimen is entirely submitted in one cassette.      TIF 2:21 p.m. on 06/18/24.  CIT less than 30 seconds 3. Received in formalin labeled with the patient's name and right breast 9 o'clock, 12 cm from nipple and consists of multiple core and core fragment of tan yellow fibroadipose tissue, ranging from 0.1 cm to 1.3 x 0.2 x 0.2 cm.The specimen is entirely submitted in one cassette.      TIF 2:37 p.m. on 06/18/24.  CIT less than 30 seconds.  (KL:gt, 06/19/24)        Report signed out from the following location(s) [place]. [facility] [address] [place], [place] [address] CLIA #: [id number]  [facility] [address] [place], [place] [address] CLIA #: [id number]      RADS: ADDENDUM REPORT: 06/19/2024 14:32   ADDENDUM: PATHOLOGY revealed: Site 1. 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   Pathology results are CONCORDANT with imaging findings, per Corean Salter M.D.   PATHOLOGY revealed: Site 2. Breast, right, needle core biopsy, 12:00; 8 cmfn (coil clip) : INVASIVE DUCTAL CARCINOMA - DUCTAL CARCINOMA IN SITU, SOLID, HIGH NUCLEAR GRADE WITH NECROSIS. OVERALL GRADE: 3 -   LYMPHOVASCULAR INVASION: NOT IDENTIFIED. CANCER LENGTH: 0.3 CM. CALCIFICATIONS: NOT IDENTIFIED   Pathology results are CONCORDANT with imaging findings, per Corean Salter M.D.   PATHOLOGY revealed: Site 3. Breast, right, needle core biopsy, 9:00; 12 cmfn (venus) : BENIGN BREAST TISSUE WITH DENSE STROMAL FIBROSIS. NEGATIVE FOR ATYPIA OR MALIGNANCY.   Pathology results are CONCORDANT with  imaging findings, per Corean Salter M.D.   Pathology results and recommendations below were discussed with patient by telephone on 06/19/2024 by Rock Hover RN. Patient reported biopsy site within normal limits with slight tenderness at the site. Post biopsy care instructions were reviewed, questions were answered and my direct phone number was provided to patient. Patient was instructed to call [facility] if any concerns or questions arise related to the biopsy.   RECOMMENDATIONS: 1. Surgical and oncological consultation. Request for surgical and oncological consultation relayed to Shasta Ada RN at [facility]  by Rock Hover RN on 06/19/2024.   2. Recommend pretreatment bilateral breast MRI with and without contrast to assess extent of breast disease, given the patient's breast density and age.   3. The 2 sites of biopsy-proven malignancy (COIL and heart clip) are approximately 4.7 cm apart.   Pathology results reported by Rock Hover RN on 06/19/2024.     Electronically Signed   By: Corean Salter M.D.   On: 06/19/2024 14:32   CLINICAL DATA:  Patient with recently diagnosed anal cancer. On staging examination, a mass was noted in the RIGHT upper breast.   EXAM: DIGITAL DIAGNOSTIC BILATERAL MAMMOGRAM WITH TOMOSYNTHESIS AND CAD; ULTRASOUND RIGHT BREAST LIMITED   TECHNIQUE: Bilateral digital diagnostic mammography and breast tomosynthesis was performed. The images were evaluated with computer-aided detection. ; Targeted ultrasound examination of the right breast was performed   COMPARISON:  None available.   ACR Breast Density Category c: The breasts are heterogeneously dense, which may obscure small masses.   FINDINGS: Diagnostic images of the RIGHT breast demonstrate an irregular mass with associated pleomorphic calcifications in the upper breast at middle depth. Mass is estimated to span 17 mm. Associated calcifications are estimated  to span approximately 17 mm.   A superficial oval mass noted in the RIGHT upper outer breast on true lateral slice 57 with possible internal fat density. No suspicious mass, distortion, or microcalcifications are identified to suggest presence of malignancy in the LEFT breast.   On physical exam, there is a hard mass in the upper breast.   Targeted ultrasound was performed of the upper breast. There is an irregular hypoechoic mass at 12 o'clock 12 cm from the nipple with indistinct margins. It demonstrates multiple associated echogenic foci and posterior acoustic shadowing. It spans approximately 15 x 11 x 12 mm and corresponds to the site of mammographic concern.   Targeted ultrasound was performed of the RIGHT axilla. No suspicious axillary lymph nodes are visualized.   Targeted ultrasound was performed of the RIGHT upper outer breast. There is incidental sonographic note of an oval hypoechoic mass with relatively circumscribed margins at 12 o'clock 8 cm from the nipple. This measures 7 x 4 by 6 mm. It is located approximately 3.5 cm from the margin of the dominant mass.   There is incidental sonographic note of a hypoechoic non mass area at 9 o'clock 12 cm from the nipple. This measures approximately 6 x 3 x 5 mm.   At 10 o'clock 10 cm from the nipple, there is a superficial oval anechoic mass with posterior acoustic shadowing and suggestion of a mild adjacent echogenic rind. This measures 3 by 4 x 3 mm. BB was placed at the site and repeat spot tangential imaging was performed. This demonstrates a small amount of internal fat density (spot tangential slice 34) at this site. This corresponds to the superficial mass noted mammographically and is favored to reflect benign fat necrosis.   IMPRESSION: 1. There is a suspicious 17 mm mass at the site of CT and mammographic concern in the RIGHT breast. It demonstrates associated pleomorphic calcifications. Recommend  ultrasound-guided biopsy for definitive characterization. 2. There is incidental sonographic note of a 7 mm mass in the RIGHT breast at 12 o'clock and a 6 mm non mass area at 9 o'clock. Recommend ultrasound-guided biopsy of these 2 areas for definitive characterization given suspicious appearance of the dominant mass. 3. No suspicious RIGHT axillary adenopathy. 4. No mammographic evidence of malignancy in the LEFT breast.   RECOMMENDATION:  RIGHT breast ultrasound-guided biopsy x3   I have discussed the findings and recommendations with the patient. The biopsy procedure was discussed with the patient and questions were answered. Patient expressed their understanding of the biopsy recommendation. Patient will be scheduled for biopsy at her earliest convenience by the schedulers. Ordering provider will be notified. If applicable, a reminder letter will be sent to the patient regarding the next appointment.   BI-RADS CATEGORY  5: Highly suggestive of malignancy.     Electronically Signed   By: Corean Salter M.D.   On: 06/17/2024 13:52   CLINICAL DATA:  Anal squamous cell carcinoma.  * Tracking Code: BO *   EXAM: CT CHEST, ABDOMEN, AND PELVIS WITH CONTRAST   TECHNIQUE: Multidetector CT imaging of the chest, abdomen and pelvis was performed following the standard protocol during bolus administration of intravenous contrast.   RADIATION DOSE REDUCTION: This exam was performed according to the departmental dose-optimization program which includes automated exposure control, adjustment of the mA and/or kV according to patient size and/or use of iterative reconstruction technique.   CONTRAST:  OMNIPAQUE  IOHEXOL  300 MG/ML  SOLN   COMPARISON:  CTA chest 11/27/2022. Abdominal ultrasound limited 08/02/2023. MRI abdomen 11/29/2022.   FINDINGS: CT CHEST FINDINGS   Cardiovascular: No significant vascular findings. Normal heart size. No pericardial effusion.    Mediastinum/Nodes: No enlarged mediastinal, hilar, or axillary lymph nodes. Thyroid  gland, trachea, and esophagus demonstrate no significant findings.   Lungs/Pleura: No dominant lung nodule. Lungs are clear. No pleural effusion or pneumothorax.   Musculoskeletal: Mild scattered degenerative changes identified. Of note there is a nodular focus along the upper aspect of the right breast on series 2, image 13 measuring 14 mm. This was not clearly seen on previous CT scan. Please correlate with dedicated mammographic evaluation and possible ultrasound.   CT ABDOMEN PELVIS FINDINGS   Hepatobiliary: Fatty liver infiltration. There is a low-attenuation lesion identified posteriorly in segment 7. On prior MRI this was felt to be a hemangioma measured at that time 2.2 x 1.7 cm. Today 2.4 by 1.5 cm, not significantly changed. No other space-occupying liver lesion. Gallbladder is present. Patent portal vein.   Pancreas: Unremarkable. No pancreatic ductal dilatation or surrounding inflammatory changes.   Spleen: Normal in size without focal abnormality.   Adrenals/Urinary Tract: Adrenal glands are unremarkable. Kidneys are normal, without renal calculi, focal lesion, or hydronephrosis. Bladder is unremarkable.   Stomach/Bowel: Slight nodular thickening in the area of the anal region. Please correlate with history of neoplasm. Otherwise large bowel has a normal course and caliber with scattered stool. Oral contrast was administered. Normal appendix. The stomach and small bowel are nondilated.   Vascular/Lymphatic: Normal caliber aorta and IVC.   Abnormal lymph node identified in the left inguinal region. Lesion measures 19 by 17 mm on series 2, image 113. Other nodes in this location are small. There is a prominent right inguinal node on series 2, image 113 as well measuring 14 by 9 mm. Borderline size. No abnormal nodal enlargement otherwise along the retroperitoneum, pelvis or  elsewhere.   Reproductive: Left-sided ovarian cystic lesion identified measuring 2.6 cm. Uterus is present.   Other: No free air or free fluid.   Musculoskeletal: Mild curvature of the spine. Scattered degenerative changes.   IMPRESSION: Slight thickening in the area of the anal canal. Please correlate for history of neoplasm. There is a pathologic enlarged left inguinal node. There is a borderline right-sided node.   No additional areas of abnormal  nodal enlargement or other aggressive appearing mass lesion at this time.   Fatty liver infiltration with known segment 7 hepatic hemangioma.   Of note there is a right-sided upper breast mass which was not clearly seen on prior CT scan. Please correlate for any prior study or if needed diagnostic mammographic evaluation and possible ultrasound when appropriate.     Electronically Signed   By: Ranell Bring M.D.   On: 06/12/2024 10:49       Assessment:    Malignant neoplasm of upper-outer quadrant of right female breast, unspecified estrogen receptor status (CMS/HHS-HCC) [C50.411]   Plan:    Plan 1. Malignant neoplasm of upper-outer quadrant of right female breast, unspecified estrogen receptor status (CMS/HHS-HCC) [C50.411]  Discussed the risk of surgery including recurrence, chronic pain, post-op infxn, poor/delayed wound healing, poor cosmesis, seroma, hematoma formation, and possible re-operation to address said risks. The risks of general anesthetic, if used, includes MI, CVA, sudden death or even reaction to anesthetic medications also discussed.  Typical post-op recovery time and possbility of activity restrictions were also discussed.  Alternatives include continued observation.  Benefits include possible symptom relief, pathologic evaluation, and/or curative excision.    The patient verbalized understanding and all questions were answered to the patient's satisfaction.   2. Right breast cancer- will likely proceed with  breast lumpectomy and sentinel lymph node biopsy, assuming additional imaging studies for breast is negative.  Will likely place port at same time of breast surgery, as long as rest of team is ok to delay port placement that was originally scheduled for this week.   Anal cancer- still recommend port placement to start chemotherapy as originally recommended.   Ana or our office will keep you updated with additional recommendations labs/images/medications/previous chart entries reviewed personally and relevant changes/updates noted above.

## 2024-06-28 ENCOUNTER — Inpatient Hospital Stay

## 2024-06-28 ENCOUNTER — Telehealth: Payer: Self-pay

## 2024-06-28 NOTE — Telephone Encounter (Signed)
 Clinical Social Work was referred by medical provider for assessment of psychosocial needs.  CSW attempted to contact patient by phone.  She has returned CSW calls.  Left voicemail with contact information and request for return call.  CSW will attempt to contact patient again at 3 today.  Also sent her a secure email.

## 2024-06-28 NOTE — Progress Notes (Signed)
 [facility] Clinical Social Work  Initial Assessment   Elenna Spratling Wermuth is a 48 y.o. year old female contacted by phone. Clinical Social Work was referred by medical provider for need for community resources.   SDOH (Social Determinants of Health) assessments performed: Yes   SDOH Screenings   Food Insecurity: No Food Insecurity (05/29/2024)  Housing: Low Risk  (05/29/2024)  Recent Concern: Housing - High Risk (05/15/2024)   Received from [facility]  Transportation Needs: No Transportation Needs (05/29/2024)  Utilities: Not At Risk (05/29/2024)  Depression (PHQ2-9): Low Risk  (06/19/2024)  Financial Resource Strain: Medium Risk (05/15/2024)   Received from [facility]  Tobacco Use: Low Risk  (06/26/2024)  Recent Concern: Tobacco Use - Medium Risk (06/24/2024)   Received from [facility]    PHQ 2/9:    06/19/2024   10:36 AM 05/29/2024   11:16 AM 05/29/2024   11:10 AM  Depression screen PHQ 2/9  Decreased Interest 0 0 0  Down, Depressed, Hopeless 0 0 0  PHQ - 2 Score 0 0 0     Distress Screen completed: No    05/29/2024   11:13 AM  ONCBCN DISTRESS SCREENING  Screening Type Initial Screening  How much distress have you been experiencing in the past week? (0-10) 0      Family/Social Information:  Housing Arrangement: patient lives with her 75 y/o son. Family members/support persons in your life? Family, Friends, and [employer].  Patient also has a 11 y/o and 63 y/o.  Her ex-husband and his girlfriend are supportive. Transportation concerns: no  Employment: Disabled.  Patient receiving short term disability from work. Income source: Short-Term Disability Financial concerns: Yes, due to illness and/or loss of work during treatment Type of concern: Utilities and Rent/ mortgage Food access concerns: no Religious or spiritual practice: Yes-Patient belongs to a [employer]. Advanced directives: No Services Currently in place:  BCBS and  Medicaid.  Coping/ Adjustment to diagnosis: Patient understands treatment plan and what happens next? 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  Clinical Social Work Clinical Goal(s):  Explore community resource options for unmet needs related to:  Financial Strain   Interventions: Discussed common feeling and emotions when being diagnosed with cancer, and the importance of support during treatment Informed patient of the support team roles and support services at [facility] Provided CSW contact information and encouraged patient to call with any questions or concerns Provided patient with information about the [employer].  Will email social security disability referral to the [facility].    Follow Up Plan: CSW will follow-up with patient by phone  Patient verbalizes understanding of plan: Yes    Macario HERO Au, LCSW Clinical Social Worker [facility]  Patient is participating in a Managed Medicaid Plan:  Yes

## 2024-06-30 ENCOUNTER — Emergency Department

## 2024-06-30 ENCOUNTER — Other Ambulatory Visit: Payer: Self-pay

## 2024-06-30 DIAGNOSIS — J45901 Unspecified asthma with (acute) exacerbation: Secondary | ICD-10-CM | POA: Insufficient documentation

## 2024-06-30 DIAGNOSIS — J45909 Unspecified asthma, uncomplicated: Secondary | ICD-10-CM | POA: Diagnosis not present

## 2024-06-30 DIAGNOSIS — C210 Malignant neoplasm of anus, unspecified: Secondary | ICD-10-CM | POA: Diagnosis present

## 2024-06-30 DIAGNOSIS — R590 Localized enlarged lymph nodes: Secondary | ICD-10-CM | POA: Diagnosis not present

## 2024-06-30 DIAGNOSIS — R059 Cough, unspecified: Secondary | ICD-10-CM | POA: Diagnosis present

## 2024-06-30 LAB — TROPONIN I (HIGH SENSITIVITY)
Troponin I (High Sensitivity): 2 ng/L (ref <18)
Troponin I (High Sensitivity): <2 ng/L (ref <18)

## 2024-06-30 LAB — BASIC METABOLIC PANEL WITH GFR
Anion gap: 12 (ref 5–15)
BUN: 6 mg/dL (ref 6–20)
CO2: 25 mmol/L (ref 22–32)
Calcium: 9.3 mg/dL (ref 8.9–10.3)
Chloride: 103 mmol/L (ref 98–111)
Creatinine, Ser: 0.9 mg/dL (ref 0.44–1.00)
GFR, Estimated: >60 mL/min (ref >60)
Glucose, Bld: 98 mg/dL (ref 70–99)
Potassium: 3.5 mmol/L (ref 3.5–5.1)
Sodium: 140 mmol/L (ref 135–145)

## 2024-06-30 LAB — HCG, QUANTITATIVE, PREGNANCY: hCG, Beta Chain, Quant, S: 1 m[IU]/mL (ref <5)

## 2024-06-30 LAB — CBC
HCT: 44 % (ref 36.0–46.0)
Hemoglobin: 14.4 g/dL (ref 12.0–15.0)
MCH: 30.1 pg (ref 26.0–34.0)
MCHC: 32.7 g/dL (ref 30.0–36.0)
MCV: 91.9 fL (ref 80.0–100.0)
Platelets: 398 K/uL (ref 150–400)
RBC: 4.79 MIL/uL (ref 3.87–5.11)
RDW: 13.5 % (ref 11.5–15.5)
WBC: 10 K/uL (ref 4.0–10.5)
nRBC: 0 % (ref 0.0–0.2)

## 2024-06-30 MED ORDER — IPRATROPIUM-ALBUTEROL 0.5-2.5 (3) MG/3ML IN SOLN
3.0000 mL | Freq: Once | RESPIRATORY_TRACT | Status: AC
  Administered 2024-06-30: 3 mL via RESPIRATORY_TRACT
  Filled 2024-06-30: qty 3

## 2024-06-30 MED ORDER — IOHEXOL 350 MG/ML SOLN
75.0000 mL | Freq: Once | INTRAVENOUS | Status: AC | PRN
  Administered 2024-06-30: 75 mL via INTRAVENOUS

## 2024-06-30 NOTE — ED Triage Notes (Signed)
 Pt arrives with c/o chest tightness that started in the last 3-4 days. Pt reports SOB and cough. Pt has hx of asthma. Pt has been using her albuterol  inhaler without relief.

## 2024-07-01 ENCOUNTER — Telehealth: Payer: Self-pay | Admitting: *Deleted

## 2024-07-01 ENCOUNTER — Encounter: Payer: Self-pay | Admitting: Oncology

## 2024-07-01 ENCOUNTER — Ambulatory Visit
Admission: RE | Admit: 2024-07-01 | Discharge: 2024-07-01 | Disposition: A | Discharge status: Home / Self Care | Source: Ambulatory Visit | Attending: Radiation Oncology | Admitting: Radiation Oncology

## 2024-07-01 ENCOUNTER — Emergency Department: Admission: EM | Admit: 2024-07-01 | Discharge: 2024-07-01 | Disposition: A | Discharge status: Home / Self Care

## 2024-07-01 ENCOUNTER — Ambulatory Visit: Payer: Self-pay

## 2024-07-01 ENCOUNTER — Other Ambulatory Visit: Payer: Self-pay

## 2024-07-01 DIAGNOSIS — C210 Malignant neoplasm of anus, unspecified: Secondary | ICD-10-CM

## 2024-07-01 DIAGNOSIS — J45901 Unspecified asthma with (acute) exacerbation: Secondary | ICD-10-CM

## 2024-07-01 DIAGNOSIS — R7309 Other abnormal glucose: Secondary | ICD-10-CM

## 2024-07-01 DIAGNOSIS — J45909 Unspecified asthma, uncomplicated: Secondary | ICD-10-CM | POA: Insufficient documentation

## 2024-07-01 DIAGNOSIS — R0789 Other chest pain: Secondary | ICD-10-CM

## 2024-07-01 DIAGNOSIS — R590 Localized enlarged lymph nodes: Secondary | ICD-10-CM | POA: Insufficient documentation

## 2024-07-01 LAB — GLUCOSE, CAPILLARY: Glucose-Capillary: 172 mg/dL — ABNORMAL HIGH (ref 70–99)

## 2024-07-01 MED ORDER — DEXAMETHASONE 6 MG PO TABS
12.0000 mg | ORAL_TABLET | Freq: Once | ORAL | Status: AC
  Administered 2024-07-01: 12 mg via ORAL
  Filled 2024-07-01: qty 2

## 2024-07-01 MED ORDER — IPRATROPIUM-ALBUTEROL 0.5-2.5 (3) MG/3ML IN SOLN
3.0000 mL | Freq: Once | RESPIRATORY_TRACT | Status: AC
  Administered 2024-07-01: 3 mL via RESPIRATORY_TRACT
  Filled 2024-07-01: qty 3

## 2024-07-01 MED ORDER — DEXAMETHASONE 6 MG PO TABS: 12.0000 mg | ORAL_TABLET | Freq: Once | ORAL | 0 refills | Status: DC | PRN

## 2024-07-01 MED ORDER — ALBUTEROL SULFATE HFA 108 (90 BASE) MCG/ACT IN AERS: 2.0000 | INHALATION_SPRAY | RESPIRATORY_TRACT | 2 refills | Status: AC | PRN

## 2024-07-01 MED ORDER — FLUDEOXYGLUCOSE F - 18 (FDG) INJECTION
10.2500 | Freq: Once | INTRAVENOUS | Status: AC | PRN
  Administered 2024-07-01: 10.25 via INTRAVENOUS

## 2024-07-01 NOTE — ED Provider Notes (Signed)
 [facility] Provider Note    Event Date/Time   First MD Initiated Contact with Patient 07/01/24 [phone]     (approximate)   History   Chest Pain  Pt arrives with c/o chest tightness that started in the last 3-4 days. Pt reports SOB and cough. Pt has hx of asthma. Pt has been using her albuterol  inhaler without relief.    HPI Monica Gonzalez is a 48 y.o. female PMH asthma, anemia, fibromyalgia, GERD, lupus, chronic pain syndrome, breast cancer on chemotherapy and radiation presents for evaluation of chest tightness - Patient has noticed cough and increasing chest tightness with wheezing over the past 2 days or so.  No fevers.  Cough is nonproductive.  Felt her home inhaler was not working so came to emergency department for eval. -No leg swelling, no frank chest pain, no pleuritic discomfort     Physical Exam   Triage Vital Signs: ED Triage Vitals  Encounter Vitals Group     BP 06/30/24 1933 120/80     Girls Systolic BP Percentile --      Girls Diastolic BP Percentile --      Boys Systolic BP Percentile --      Boys Diastolic BP Percentile --      Pulse Rate 06/30/24 1933 84     Resp 06/30/24 1933 (!) 21     Temp 06/30/24 1933 97.7 F (36.5 C)     Temp Source 06/30/24 1933 Oral     SpO2 06/30/24 1933 100 %     Weight 06/30/24 1934 192 lb (87.1 kg)     Height --      Head Circumference --      Peak Flow --      Pain Score 06/30/24 1933 8     Pain Loc --      Pain Education --      Exclude from Growth Chart --     Most recent vital signs: Vitals:   06/30/24 1933 06/30/24 2304  BP: 120/80 105/78  Pulse: 84 64  Resp: (!) 21 18  Temp: 97.7 F (36.5 C) 98 F (36.7 C)  SpO2: 100% 100%     General: Awake, no distress.  CV:  Good peripheral perfusion. RRR, RP 2+ Resp:  Normal effort. CTAB though mild end expiratory wheezing noted in bilateral lower lung fields Abd:  No distention. Nontender to deep palpation throughout    ED Results /  Procedures / Treatments   Labs (all labs ordered are listed, but only abnormal results are displayed) Labs Reviewed  BASIC METABOLIC PANEL WITH GFR  CBC  HCG, QUANTITATIVE, PREGNANCY  TROPONIN I (HIGH SENSITIVITY)  TROPONIN I (HIGH SENSITIVITY)     EKG  See ED course below   RADIOLOGY Radiology interpreted by myself and radiology reports reviewed.  No acute pathology identified.    PROCEDURES:  Critical Care performed: No  Procedures   MEDICATIONS ORDERED IN ED: Medications  ipratropium-albuterol  (DUONEB) 0.5-2.5 (3) MG/3ML nebulizer solution 3 mL (has no administration in time range)  dexamethasone  (DECADRON ) tablet 12 mg (has no administration in time range)  ipratropium-albuterol  (DUONEB) 0.5-2.5 (3) MG/3ML nebulizer solution 3 mL (3 mLs Nebulization Given 06/30/24 1956)  iohexol  (OMNIPAQUE ) 350 MG/ML injection 75 mL (75 mLs Intravenous Contrast Given 06/30/24 2032)     IMPRESSION / MDM / ASSESSMENT AND PLAN / ED COURSE  I reviewed the triage vital signs and the nursing notes.  DDX/MDM/AP: Differential diagnosis includes, but is not limited to, asthma exacerbation, considered but doubt ACS, consider underlying PE, pneumonia.  Plan: - Labs - Chest x-ray and CTA chest performed from triage --> unremarkable - DuoNeb - Decadron  (has allergy to prednisone but has tolerated Decadron  well per chart review)  Patient's presentation is most consistent with acute presentation with potential threat to life or bodily function.  The patient is on the cardiac monitor to evaluate for evidence of arrhythmia and/or significant heart rate changes.  ED course below.  Laboratory workup and imaging unremarkable.  Presentation most consistent with asthma exacerbation in setting of URI, no evidence of underlying pneumonia nor PE.  Satting well on room air with normal work of breathing, treated with DuoNeb.  Treated with single dose of Decadron  and  prescribed second dose to use in about 48 hours for any persistent symptoms.  Also refilled albuterol  inhaler.  Continue with home inhaler as needed.  Plan for PMD follow-up.  ED return precautions in place.  Patient agrees with plan.  Clinical Course as of 07/01/24 0138  Mon Jul 01, 2024  0051 Serial troponins stable  CBC, BMP reviewed, unremarkable  Hcg neg [MM]  0052 Chest x-ray and CTA PE chest reviewed, unremarkable my interpretation.  Radiology report reviewed, unremarkable. [MM]  0125 Ecg = sinus rhythm, rate 84, no gross ST elevation or depression, no significant repolarization abnormality, normal axis, normal intervals.  No evidence of ischemia nor arrhythmia on my interpretation. [MM]    Clinical Course User Index [MM] Clarine Ozell LABOR, MD     FINAL CLINICAL IMPRESSION(S) / ED DIAGNOSES   Final diagnoses:  Exacerbation of asthma, unspecified asthma severity, unspecified whether persistent  Feeling of chest tightness     Rx / DC Orders   ED Discharge Orders          Ordered    dexamethasone  (DECADRON ) 6 MG tablet  Once PRN        07/01/24 0137    albuterol  (VENTOLIN  HFA) 108 (90 Base) MCG/ACT inhaler  Every 4 hours PRN        07/01/24 0137             Note:  This document was prepared using Dragon voice recognition software and may include unintentional dictation errors.   Clarine Ozell LABOR, MD 07/01/24 [phone]

## 2024-07-01 NOTE — Telephone Encounter (Signed)
 FYI Only or Action Required?: FYI only for provider.  Called Nurse Triage reporting Shortness of Breath.  Symptoms began 3-4 days ago.  Interventions attempted: Other: Patient was seen and treated in the ED.  Symptoms are: improved but still present.  Triage Disposition: See HCP Within 4 Hours (Or PCP Triage)  Patient/caregiver understands and will follow disposition?: Yes (Already seen in the ED)     Copied from CRM #[id number]. Topic: Clinical - Red Word Triage >> Jul 01, 2024  5:29 PM Monica Gonzalez wrote: [facility] that prompted transfer to Nurse Triage: Difficult breathing         Reason for Disposition  [1] Longstanding difficulty breathing (e.g., CHF, COPD, emphysema) AND [2] WORSE than normal  Answer Assessment - Initial Assessment Questions Patient already seen in the ED. Patient has a new patient appointment on 8/21. She was advised to go to urgent care or back to the ED if she needs prior to her appointment.         1. RESPIRATORY STATUS: Describe your breathing? (e.g., wheezing, shortness of breath, unable to speak, severe coughing)      Shortness of breath  2. ONSET: When did this breathing problem begin?      Worsening recently, seen in the ED last night  3. PATTERN Does the difficult breathing come and go, or has it been constant since it started?      Intermittent  4. SEVERITY: How bad is your breathing? (e.g., mild, moderate, severe)      Moderate  5. RECURRENT SYMPTOM: Have you had difficulty breathing before? If Yes, ask: When was the last time? and What happened that time?      Yes, history of asthma  6. CARDIAC HISTORY: Do you have any history of heart disease? (e.g., heart attack, angina, bypass surgery, angioplasty)      No 7. LUNG HISTORY: Do you have any history of lung disease?  (e.g., pulmonary embolus, asthma, emphysema)     Asthma  8. CAUSE: What do you think is causing the breathing problem?      History of asthma  9.  OTHER SYMPTOMS: Do you have any other symptoms? (e.g., chest pain, cough, dizziness, fever, runny nose)     Cough with burning in chest during coughing  10. O2 SATURATION MONITOR:  Do you use an oxygen saturation monitor (pulse oximeter) at home? If Yes, ask: What is your reading (oxygen level) today? What is your usual oxygen saturation reading? (e.g., 95%)       N/A  Protocols used: Breathing Difficulty-A-AH

## 2024-07-01 NOTE — ED Notes (Addendum)
 Pt placed on cardiac monitor at this time. Call light within reach. Pt has no further needs.

## 2024-07-01 NOTE — Telephone Encounter (Signed)
 Called for STAT read on PET scan done today

## 2024-07-01 NOTE — Discharge Instructions (Addendum)
 Your evaluation in the emergency department was most consistent with an asthma exacerbation.  You received a dose of steroids here in the emergency department, and I prescribed you a second dose that you can use on 07/03/2024 if you feel you have persistent shortness of breath and wheezing.  I also refilled your albuterol  inhaler--please continue to use this as prescribed.  Please do follow-up with your primary care provider for reevaluation, and return to the emergency department with any new or worsening symptoms.

## 2024-07-02 ENCOUNTER — Encounter: Payer: Self-pay | Admitting: *Deleted

## 2024-07-02 ENCOUNTER — Other Ambulatory Visit: Payer: Self-pay

## 2024-07-02 ENCOUNTER — Other Ambulatory Visit: Payer: Self-pay | Admitting: Surgery

## 2024-07-02 ENCOUNTER — Encounter: Payer: Self-pay | Admitting: Oncology

## 2024-07-02 ENCOUNTER — Encounter
Admission: RE | Admit: 2024-07-02 | Discharge: 2024-07-02 | Disposition: A | Discharge status: Home / Self Care | Source: Ambulatory Visit | Attending: Surgery | Admitting: Surgery

## 2024-07-02 DIAGNOSIS — C50919 Malignant neoplasm of unspecified site of unspecified female breast: Secondary | ICD-10-CM

## 2024-07-02 DIAGNOSIS — C50411 Malignant neoplasm of upper-outer quadrant of right female breast: Secondary | ICD-10-CM

## 2024-07-02 DIAGNOSIS — Z01812 Encounter for preprocedural laboratory examination: Secondary | ICD-10-CM

## 2024-07-02 HISTORY — DX: Dyspnea, unspecified: R06.00

## 2024-07-02 HISTORY — DX: Post-traumatic stress disorder, unspecified: F43.10

## 2024-07-02 HISTORY — DX: Personal history of urinary calculi: Z87.442

## 2024-07-02 HISTORY — DX: Headache, unspecified: R51.9

## 2024-07-02 HISTORY — DX: Anxiety disorder, unspecified: F41.9

## 2024-07-02 HISTORY — DX: Angina pectoris, unspecified: I20.9

## 2024-07-02 HISTORY — DX: Depression, unspecified: F32.A

## 2024-07-02 HISTORY — DX: Unspecified osteoarthritis, unspecified site: M19.90

## 2024-07-02 HISTORY — DX: Pneumonia, unspecified organism: J18.9

## 2024-07-02 NOTE — Patient Instructions (Addendum)
 Your procedure is scheduled on: 08/25 /25 - Monday Report to the Registration Desk on the 1st floor of the [facility]. To find out your arrival time, please call [phone] between 1PM - 3PM on: 07/05/24 - Friday If your arrival time is 6:00 am, do not arrive before that time as the [facility] entrance doors do not open until 6:00 am.  REMEMBER: Instructions that are not followed completely may result in serious medical risk, up to and including death; or upon the discretion of your surgeon and anesthesiologist your surgery may need to be rescheduled.  Do not eat food after midnight the night before surgery.  No gum chewing or hard candies.  You may however, drink CLEAR liquids up to 2 hours before you are scheduled to arrive for your surgery. Do not drink anything within 2 hours of your scheduled arrival time.  Clear liquids include: - water  - apple juice without pulp - gatorade (not RED colors) - black coffee or tea (Do NOT add milk or creamers to the coffee or tea) Do NOT drink anything that is not on this list.   One week prior to surgery: Stop Anti-inflammatories (NSAIDS) such as Advil , Aleve , Ibuprofen , Motrin , Naproxen , Naprosyn  and Aspirin based products such as Excedrin, Goody's Powder, BC Powder.  Stop ANY OVER THE COUNTER supplements until after surgery.  You may however, continue to take Tylenol  if needed for pain up until the day of surgery.  Continue taking all of your other prescription medications up until the day of surgery.  ON THE DAY OF SURGERY ONLY TAKE THESE MEDICATIONS WITH SIPS OF WATER:  busPIRone (BUSPAR)  celecoxib (CELEBREX)   Use inhalers on the day of surgery and bring to the hospital.   No Alcohol for 24 hours before or after surgery.  No Smoking including e-cigarettes for 24 hours before surgery.  No chewable tobacco products for at least 6 hours before surgery.  No nicotine patches on the day of surgery.  Do not use any  recreational drugs for at least a week (preferably 2 weeks) before your surgery.  Please be advised that the combination of cocaine and anesthesia may have negative outcomes, up to and including death. If you test positive for cocaine, your surgery will be cancelled.  On the morning of surgery brush your teeth with toothpaste and water, you may rinse your mouth with mouthwash if you wish. Do not swallow any toothpaste or mouthwash.  Use CHG Soap or wipes as directed on instruction sheet.  Do not wear jewelry, make-up, hairpins, clips or nail polish.  For welded (permanent) jewelry: bracelets, anklets, waist bands, etc.  Please have this removed prior to surgery.  If it is not removed, there is a chance that hospital personnel will need to cut it off on the day of surgery.  Do not wear lotions, powders, or perfumes.   Do not shave body hair from the neck down 48 hours before surgery.  Contact lenses, hearing aids and dentures may not be worn into surgery.  Do not bring valuables to the hospital. [facility] is not responsible for any missing/lost belongings or valuables.   Notify your doctor if there is any change in your medical condition (cold, fever, infection).  Wear comfortable clothing (specific to your surgery type) to the hospital.  After surgery, you can help prevent lung complications by doing breathing exercises.  Take deep breaths and cough every 1-2 hours. Your doctor may order a device called an Incentive  Spirometer to help you take deep breaths.  When coughing or sneezing, hold a pillow firmly against your incision with both hands. This is called "splinting." Doing this helps protect your incision. It also decreases belly discomfort.  If you are being admitted to the hospital overnight, leave your suitcase in the car. After surgery it may be brought to your room.  In case of increased patient census, it may be necessary for you, the patient, to continue your  postoperative care in the Same Day Surgery department.  If you are being discharged the day of surgery, you will not be allowed to drive home. You will need a responsible individual to drive you home and stay with you for 24 hours after surgery.   If you are taking public transportation, you will need to have a responsible individual with you.  Please call the Pre-admissions Testing Dept. at [phone] if you have any questions about these instructions.  Surgery Visitation Policy:  Patients having surgery or a procedure may have two visitors.  Children under the age of 49 must have an adult with them who is not the patient.  Inpatient Visitation:    Visiting hours are 7 a.m. to 8 p.m. Up to four visitors are allowed at one time in a patient room. The visitors may rotate out with other people during the day.  One visitor age 88 or older may stay with the patient overnight and must be in the room by 8 p.m.   Merchandiser, retail to address health-related social needs:  [email or web address]     Preparing for Surgery with CHLORHEXIDINE  GLUCONATE (CHG) Soap  Chlorhexidine  Gluconate (CHG) Soap  o An antiseptic cleaner that kills germs and bonds with the skin to continue killing germs even after washing  o Used for showering the night before surgery and morning of surgery  Before surgery, you can play an important role by reducing the number of germs on your skin.  CHG (Chlorhexidine  gluconate) soap is an antiseptic cleanser which kills germs and bonds with the skin to continue killing germs even after washing.  Please do not use if you have an allergy to CHG or antibacterial soaps. If your skin becomes reddened/irritated stop using the CHG.  1. Shower the NIGHT BEFORE SURGERY and the MORNING OF SURGERY with CHG soap.  2. If you choose to wash your hair, wash your hair first as usual with your normal shampoo.  3. After shampooing, rinse your hair and body  thoroughly to remove the shampoo.  4. Use CHG as you would any other liquid soap. You can apply CHG directly to the skin and wash gently with a scrungie or a clean washcloth.  5. Apply the CHG soap to your body only from the neck down. Do not use on open wounds or open sores. Avoid contact with your eyes, ears, mouth, and genitals (private parts). Wash face and genitals (private parts) with your normal soap.  6. Wash thoroughly, paying special attention to the area where your surgery will be performed.  7. Thoroughly rinse your body with warm water.  8. Do not shower/wash with your normal soap after using and rinsing off the CHG soap.  9. Pat yourself dry with a clean towel.  10. Wear clean pajamas to bed the night before surgery.  12. Place clean sheets on your bed the night of your first shower and do not sleep with pets.  13. Shower again with the CHG soap on the day of surgery  prior to arriving at the hospital.  14. Do not apply any deodorants/lotions/powders.  15. Please wear clean clothes to the hospital.

## 2024-07-02 NOTE — Progress Notes (Signed)
 Lumpectomy and port placement have been scheduled for 8/25.   CT simulation for the anal cancer will be on 9/10.

## 2024-07-03 ENCOUNTER — Ambulatory Visit

## 2024-07-03 ENCOUNTER — Other Ambulatory Visit: Payer: Self-pay

## 2024-07-03 NOTE — Telephone Encounter (Signed)
 Hello Monica Gonzalez,   Would you be able to look into this for pt. She is saying that insurance is denying her claim for the breast cancer treatment.

## 2024-07-03 NOTE — Progress Notes (Signed)
 Established Patient Visit   Chief Complaint: Chief Complaint  Patient presents with  . Follow-up   Date of Service: 07/03/2024 Date of Birth: 1976/01/03 PCP: Epifanio Alm Dover, MD  History of Present Illness: Monica Gonzalez is a 48 y.o.female patient with a past medical history significant for palpitations, lupus, and a strong family history of CAD who presents for a follow up visit.  She was last seen in our clinic on 03/01/22.   She was evaluated on 07/01/24 at [facility] for a few day history of a cough and chest tightness.  Chest CT was negative for a PE and she ruled out for ACS.  She underwent a full body PET scan that revealed an enlarged heart.  Symptoms were thought to be secondary to an asthma flare up.   She presents today and reports an ongoing cough, chest tightness, and shortness of breath.  Symptoms haven't been improving with her inhalers and she's scheduled to see pulmonology tomorrow.  She reports bilateral lower extremity edema but has no evidence of edema on exam today.  She denies orthopnea or PND.  She denies syncope or presyncope.   Exercise Treadmill Stress Test 02/11/22 Conclusions  GXT  manually manipulated treadmill due to low exercise tolerance   Echocardiogram 01/31/22 INTERPRETATION NORMAL LEFT VENTRICULAR SYSTOLIC FUNCTION NORMAL RIGHT VENTRICULAR SYSTOLIC FUNCTION MILD VALVULAR REGURGITATION (See above) NO VALVULAR STENOSIS   Holter Monitor 06/29/2020 Holter showed predominately NSR at average rate of 74bpm. There were rare PACs and PVCs. No sustained runs of SVT or ventricular rhythms. Diary events associated with NSR  Past Medical and Surgical History  Past Medical History Past Medical History:  Diagnosis Date  . Acute stomach ulcer   . Allergy   . Anemia   . Anxiety   . Arthritis   . Asthma (HHS-HCC)   . Bronchitis, chronic (CMS/HHS-HCC)   . COPD (chronic obstructive pulmonary disease) (CMS/HHS-HCC)   . Depression   . GERD (gastroesophageal  reflux disease)   . Heart murmur   . Hiatal hernia   . History of abnormal cervical Pap smear   . History of cancer   . History of pneumonia   . Kidney stones   . Lupus   . 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   Medications and Allergies  Current Medications  Current Outpatient Medications  Medication Sig Dispense Refill  . busPIRone (BUSPAR) 5 MG tablet Take 1 tablet (5 mg total) by mouth 2 (two) times daily 60 tablet 11  . calcium carbonate (TUMS ORAL) Take 2 each by mouth every 4 (four) hours as needed    . celecoxib (CELEBREX) 200 MG capsule Take 1 capsule by mouth twice daily 60 capsule 0  . clonazePAM (KLONOPIN) 0.5 MG tablet Take 1 tablet (0.5 mg total) by mouth at bedtime as needed 30 tablet 1  . cyclobenzaprine  (FLEXERIL ) 5 MG tablet Take 1 tablet by mouth three times daily as needed 30 tablet 0  . DULoxetine (CYMBALTA) 30 MG DR capsule Take 1 capsule (30 mg total) by mouth once daily 30 capsule 11  . HYDROcodone -acetaminophen  (NORCO) 5-325 mg tablet Take 1 tablet by mouth every 12 (twelve) hours as needed for Pain 20 tablet 0  . ipratropium-albuteroL  (DUO-NEB) nebulizer solution  Inhale 3 mLs into the lungs    . lidocaine  (LIDODERM ) 5 % patch APPLY ONE PATCH TOPICALLY TO CLEAN, DRY SKIN. LEAVE ON FOR 12 HOURS THEN REMOVE. MUST WAIT AT LEAST 12 HOURS BEFORE APPLYING PATCH(ES) AGAIN.    SABRA linaCLOtide (LINZESS) 145 mcg capsule Take 1 capsule (145 mcg total) by mouth once daily Take 30 mins before food 30 capsule 3  . mag hydrox/aluminum hyd/simeth (MAALOX MAXIMUM STRENGTH ORAL) Take 30 mLs by mouth every 3 (three) hours as needed    . polyethylene glycol (MIRALAX ) powder Take by mouth    .  promethazine -dextromethorphan (PROMETHAZINE -DM) 6.25-15 mg/5 mL syrup Take 5 mLs by mouth every 6 (six) hours as needed for Cough 118 mL 1  . SUMAtriptan (IMITREX) 50 MG tablet Take 1 tablet (50 mg total) by mouth as directed for Migraine May take a second dose after 2 hours if needed. 9 tablet 0  . tranexamic acid (LYSTEDA) 650 mg tablet Take 2 tablets (1,300 mg total) by mouth 3 (three) times daily Take for a maximum of 5 days during monthly menstruation. 30 tablet 3  . traZODone (DESYREL) 50 MG tablet Take 1 tablet (50 mg total) by mouth at bedtime 30 tablet 11  . albuterol  (PROVENTIL ) 2.5 mg /3 mL (0.083 %) nebulizer solution Take 3 mLs (2.5 mg total) by nebulization every 6 (six) hours as needed for Wheezing 75 mL 12  . albuterol  90 mcg/actuation inhaler Inhale 2 inhalations into the lungs every 6 (six) hours as needed for Wheezing 1 each 1  . gabapentin (NEURONTIN) 300 MG capsule Take 2 capsules (600 mg total) by mouth 2 (two) times daily 360 capsule 3   No current facility-administered medications for this visit.    Allergies: Prednisone  Social and Family History  Social History  reports that she quit smoking about 22 years ago. Her smoking use included cigarettes. She has never used smokeless tobacco. She reports current alcohol use. She reports that she does not use drugs.  Family History Family History  Problem Relation Name Age of Onset  . Diabetes type II Mother Monica Gonzalez   . Coronary Artery Disease (Blocked arteries around heart) Mother Monica Gonzalez   . Myocardial Infarction (Heart attack) Mother Monica Gonzalez   . Kidney failure Mother Monica Gonzalez   . Asthma Mother Monica Gonzalez   . COPD Mother Monica Gonzalez   . Anxiety Mother Monica Gonzalez   . Obesity Mother Monica Gonzalez   . Coronary Artery Disease (Blocked arteries around heart) Father Monica Gonzalez   . High blood pressure (Hypertension) Maternal Grandmother Monica Gonzalez   . Heart failure Maternal Grandmother  Monica Gonzalez   . Lupus Maternal Grandmother Monica Gonzalez   . Clotting disorder Maternal Grandmother Monica Gonzalez   . Atrial fibrillation (Abnormal heart rhythm sometimes requiring treatment with blood thinners) Maternal Grandmother Monica Gonzalez   . Autoimmune disease Maternal Grandmother Monica Gonzalez   . Stroke Maternal Grandmother Monica Gonzalez   . Prostate cancer Maternal Grandfather Monica Gonzalez   . Cancer Paternal Grandfather Monica Gonzalez   . Colon cancer Paternal Grandfather Monica Gonzalez   . No Known Problems Half-Sister    . No Known Problems Half-Brother    . Deep vein thrombosis (DVT or abnormal blood clot formation) Paternal Grandmother Monica Gonzalez   . Hip fracture Paternal Grandmother Monica Gonzalez   . Osteoarthritis Paternal Grandmother Monica Gonzalez     Review of Systems   Review of Systems: The patient denies chest pain, shortness  of breath, orthopnea, paroxysmal nocturnal dyspnea, pedal edema, palpitations, heart racing, fatigue, dizziness, lightheadedness, presyncope, syncope, leg pain, leg cramping. Review of 12 Systems is negative except as described in HPI.   Physical Examination   Vitals:BP 110/60 (BP Location: Left upper arm, Patient Position: Sitting, BP Cuff Size: Large Adult)   Pulse 96   Wt 85.7 kg (189 lb)   SpO2 98%   BMI 38.17 kg/m  Ht:  Wt:85.7 kg (189 lb) ADJ:Anib surface area is 1.89 meters squared. Body mass index is 38.17 kg/m.  BP Readings from Last 3 Encounters:  07/03/24 110/60  06/24/24 109/75  06/12/24 107/74    Wt Readings from Last 3 Encounters:  07/03/24 85.7 kg (189 lb)  06/24/24 88 kg (194 lb)  06/12/24 88 kg (194 lb)    Recent Labs    07/26/23 0727 05/10/24 1522  NA 142 141  K 4.2 4.0  CL 108 107  CO2 27.5 29.5  BUN 11 9  CREATININE 0.9 0.7   Recent Labs    05/10/24 1522  HGBA1C 4.9  AVGBLDGLUC 94   Recent Labs    07/26/23 0727 05/10/24 1522  ALT 14 17  AST 17 18   Recent Labs    07/26/23 0727  05/10/24 1522  WBC 8.1 9.0  HGB 13.8 13.5  HCT 43.7 42.4  PLT 314 325   Recent Labs    05/10/24 1522  TSH 1.283   No results for input(s): CHOLTOTAL, LDLCALC, LDL, HDL, TRIG in the last 8760 hours.   General: Well developed, well nourished. In no acute distress HEENT: Pupils equally reactive to light and accomodation  Neck: Supple without thyromegaly, or goiter. Carotid pulses 2+. No carotid bruits present.  Pulmonary: Expiratory wheezes noted in lower lung fields bilaterally Cardiovascular: Regular rate and rhythm.  No gallops, murmurs or rubs Gastrointestinal: Soft nontender, nondistended, with normal bowel sounds Extremities: No cyanosis, clubbing, or edema Peripheral Pulses: 2+ in upper extremities, 2+ in lower extremities  Neurology: Alert and oriented X3 Pysch: Good affect. Responds appropriately   Assessment and Plan   48 y.o. female with  1. Atypical chest pain     -Ruled out for ACS during recent ED visit. Echo pending.   2. Family history of premature CAD     -ETT in 2023 was negative for ischemia.  Echocardiogram pending.  If there's any evidence of LV systolic dysfunction or wall motion abnormalities, we'll proceed with an ischemic workup   3. Shortness of breath/cardiomegaly     -I suspect her symptoms are pulmonary in nature, but due to an enlarged heart on recent PET, we'll further assess with a pro BNP and echocardiogram.  She'll follow up with pulmonology tomorrow as scheduled     Orders Placed This Encounter  Procedures  . Pro-Brain Natriuretic peptide, N-Terminal (NT-pro-BNP)  . Echo complete    No follow-ups on file.      Attestation Statement:   I personally performed the service, non-incident to. (WP)   NICOLE LYNN STEPHENS, PA

## 2024-07-03 NOTE — Progress Notes (Signed)
 Chief Complaint:    Monica Gonzalez is a 48 y.o. female here for Discuss sono .pt with rectal cancer and new dx of right breast ca and left metastatic inguinal node  Pt here for follow up for CT scan showing left ovarian cyst  TVUS today :  Uterus ======   Visualized. Size 67 mm x 46 mm x 52 mm Normal Position: mid-position Malformations: none Myometrium: appears normal Endometrium: appears normal/appropriate. Endometrial thickness, total 8.4 mm Cervix details: nabothian cysts visualized No fibroids identified No polyps identified   Right Ovary =========   Visualized, Normal. Outline: Normal. Morphology: Appropriate. Size 31 mm x 22 mm x 12 mm   Left Ovary ========   Visualized, Enlarged. Outline: Normal. Morphology: Appropriate. Size 42 mm x 30 mm x 27 mm Cyst(s)    Simple unilocular cyst with smooth walls; acoustic shadows not present; no Color Doppler. Size 32.0 mm x 24.0 mm x 19 mm. Mean 25.0        mm. Vol 7.640 cm Follicle(s)    paraovarian. Size 15.0 mm x 11.0 mm x 12.0 mm. Mean 12.7 mm. Vol 1.037 cm   Cul de Sac =========   Normal. No free fluid visualized    Past Medical History:  has a past medical history of Acute stomach ulcer, Allergy, Anemia, Anxiety, Arthritis, Asthma (HHS-HCC), Bronchitis, chronic (CMS/HHS-HCC), COPD (chronic obstructive pulmonary disease) (CMS/HHS-HCC), Depression, GERD (gastroesophageal reflux disease), Heart murmur, Hiatal hernia, History of abnormal cervical Pap smear, History of cancer, History of pneumonia, Kidney stones, Lupus, and Vulvar intraepithelial neoplasia (VIN) grade 3.  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 Family History: family history includes Anxiety in her mother; Asthma in her mother;  Atrial fibrillation (Abnormal heart rhythm sometimes requiring treatment with blood thinners) in her maternal grandmother; Autoimmune disease in her maternal grandmother; COPD in her mother; Cancer in her paternal grandfather; Clotting disorder in her maternal grandmother; Colon cancer in her paternal grandfather; Coronary Artery Disease (Blocked arteries around heart) in her father and mother; Deep vein thrombosis (DVT or abnormal blood clot formation) in her paternal grandmother; Diabetes type II in her mother; Heart failure in her maternal grandmother; High blood pressure (Hypertension) in her maternal grandmother; Hip fracture in her paternal grandmother; Kidney failure in her mother; Lupus in her maternal grandmother; Myocardial Infarction (Heart attack) in her mother; No Known Problems in her half-brother and half-sister; Obesity in her mother; Osteoarthritis in her paternal grandmother; Prostate cancer in her maternal grandfather; Stroke in her maternal grandmother. Social History:  reports that she quit smoking about 22 years ago. Her smoking use included cigarettes. She has never used smokeless tobacco. She reports current alcohol use. She reports that she does not use drugs. OB/GYN History:  OB History     Gravida  6   Para  3   Term  3   Preterm      AB  3   Living  3      SAB      IAB      Ectopic      Molar      Multiple      Live Births  3          Allergies: is allergic to prednisone. Medications:  Current Outpatient Medications:  .  busPIRone (BUSPAR)  5 MG tablet, Take 1 tablet (5 mg total) by mouth 2 (two) times daily, Disp: 60 tablet, Rfl: 11 .  calcium carbonate (TUMS ORAL), Take 2 each by mouth every 4 (four) hours as needed, Disp: , Rfl:  .  celecoxib (CELEBREX) 200 MG capsule, Take 1 capsule by mouth twice daily, Disp: 60 capsule, Rfl: 0 .  clonazePAM (KLONOPIN) 0.5 MG tablet, Take 1 tablet (0.5 mg total) by mouth at bedtime as needed, Disp: 30 tablet,  Rfl: 1 .  cyclobenzaprine  (FLEXERIL ) 5 MG tablet, Take 1 tablet by mouth three times daily as needed, Disp: 30 tablet, Rfl: 0 .  dexAMETHasone  (DECADRON ) 6 MG tablet, Take 6 mg by mouth daily with breakfast, Disp: , Rfl:  .  DULoxetine (CYMBALTA) 30 MG DR capsule, Take 1 capsule (30 mg total) by mouth once daily, Disp: 30 capsule, Rfl: 11 .  HYDROcodone -acetaminophen  (NORCO) 5-325 mg tablet, Take 1 tablet by mouth every 12 (twelve) hours as needed for Pain, Disp: 20 tablet, Rfl: 0 .  ipratropium-albuteroL  (DUO-NEB) nebulizer solution, Inhale 3 mLs into the lungs, Disp: , Rfl:  .  lidocaine  (LIDODERM ) 5 % patch, APPLY ONE PATCH TOPICALLY TO CLEAN, DRY SKIN. LEAVE ON FOR 12 HOURS THEN REMOVE. MUST WAIT AT LEAST 12 HOURS BEFORE APPLYING PATCH(ES) AGAIN., Disp: , Rfl:  .  linaCLOtide (LINZESS) 145 mcg capsule, Take 1 capsule (145 mcg total) by mouth once daily Take 30 mins before food, Disp: 30 capsule, Rfl: 3 .  mag hydrox/aluminum hyd/simeth (MAALOX MAXIMUM STRENGTH ORAL), Take 30 mLs by mouth every 3 (three) hours as needed, Disp: , Rfl:  .  polyethylene glycol (MIRALAX ) powder, Take by mouth, Disp: , Rfl:  .  promethazine -dextromethorphan (PROMETHAZINE -DM) 6.25-15 mg/5 mL syrup, Take 5 mLs by mouth every 6 (six) hours as needed for Cough, Disp: 118 mL, Rfl: 1 .  SUMAtriptan (IMITREX) 50 MG tablet, Take 1 tablet (50 mg total) by mouth as directed for Migraine May take a second dose after 2 hours if needed., Disp: 9 tablet, Rfl: 0 .  tranexamic acid (LYSTEDA) 650 mg tablet, Take 2 tablets (1,300 mg total) by mouth 3 (three) times daily Take for a maximum of 5 days during monthly menstruation., Disp: 30 tablet, Rfl: 3 .  traZODone (DESYREL) 50 MG tablet, Take 1 tablet (50 mg total) by mouth at bedtime, Disp: 30 tablet, Rfl: 11 .  albuterol  (PROVENTIL ) 2.5 mg /3 mL (0.083 %) nebulizer solution, Take 3 mLs (2.5 mg total) by nebulization every 6 (six) hours as needed for Wheezing, Disp: 75 mL, Rfl: 12 .   albuterol  90 mcg/actuation inhaler, Inhale 2 inhalations into the lungs every 6 (six) hours as needed for Wheezing, Disp: 1 each, Rfl: 1 .  gabapentin (NEURONTIN) 300 MG capsule, Take 2 capsules (600 mg total) by mouth 2 (two) times daily, Disp: 360 capsule, Rfl: 3  Review of Systems: General:   No fatigue or weight loss Eyes:   No vision changes Ears:   No hearing difficulty Respiratory:                No cough or shortness of breath Pulmonary:   No asthma or shortness of breath Cardiovascular:        No chest pain, palpitations, dyspnea on exertion Gastrointestinal:          No abdominal bloating, chronic diarrhea, constipations, masses, pain or hematochezia Genitourinary:  No hematuria, dysuria, abnormal vaginal discharge, pelvic pain, Menometrorrhagia Lymphatic:  No swollen lymph nodes Musculoskeletal: No muscle  weakness Neurologic:  No extremity weakness, syncope, seizure disorder Psychiatric:  No history of depression, delusions or suicidal/homicidal ideation    Exam:   Vitals:   07/03/24 1352  BP: 130/80  Pulse: 76    Body mass index is 38.58 kg/m.  WDWN white/  female in NAD   Lungs: CTA  CV : RRR without murmur     Impression:   The encounter diagnosis was Left ovarian cyst.  Benign appearing   Plan:  Explained the results to her and would recommend a follow up u/s in 3 months     Orders Placed This Encounter  Procedures  . US  FDC pelvic transvaginal    Standing Status:   Future    Expected Date:   10/03/2024    Expiration Date:   07/03/2025    Reason for Exam: (Free Text):   Left ovarian cyst    Release to patient:   Immediate    No follow-ups on file.  Monica CLARYCE DINSMORE, MD

## 2024-07-04 ENCOUNTER — Encounter: Payer: Self-pay | Admitting: Pulmonary Disease

## 2024-07-04 ENCOUNTER — Ambulatory Visit (INDEPENDENT_AMBULATORY_CARE_PROVIDER_SITE_OTHER): Admitting: Pulmonary Disease

## 2024-07-04 VITALS — BP 132/90 | HR 86 | Temp 98.3°F | Ht 59.0 in | Wt 191.2 lb

## 2024-07-04 DIAGNOSIS — R051 Acute cough: Secondary | ICD-10-CM

## 2024-07-04 DIAGNOSIS — J209 Acute bronchitis, unspecified: Secondary | ICD-10-CM

## 2024-07-04 LAB — NITRIC OXIDE: Nitric Oxide: 12

## 2024-07-04 MED ORDER — AZITHROMYCIN 250 MG PO TABS: ORAL_TABLET | ORAL | 0 refills | Status: AC

## 2024-07-04 MED ORDER — BENZONATATE 200 MG PO CAPS: 200.0000 mg | ORAL_CAPSULE | Freq: Three times a day (TID) | ORAL | 1 refills | Status: AC | PRN

## 2024-07-04 MED ORDER — IPRATROPIUM BROMIDE HFA 17 MCG/ACT IN AERS: 2.0000 | INHALATION_SPRAY | Freq: Four times a day (QID) | RESPIRATORY_TRACT | 1 refills | Status: DC | PRN

## 2024-07-04 NOTE — Patient Instructions (Signed)
 VISIT SUMMARY:  You came in today because of a worsening cough and shortness of breath that you've been experiencing over the past four days. Your symptoms are similar to what you had during your previous COVID pneumonia. You visited the ER recently, and although the chest x-ray showed no significant findings, your symptoms have continued to worsen. You also saw your cardiologist, who tested for congestive heart failure and prescribed a diuretic, which you have not yet obtained. You have a history of lupus and were recently diagnosed with two types of cancer. In the ER, you were given cough syrup, an emergency inhaler refill, and Decadron , but these did not significantly help your symptoms.  YOUR PLAN:  -ACUTE COUGH AND SHORTNESS OF BREATH: Your symptoms of cough and shortness of breath have worsened over the past few days, and you have experienced weakness and pain in your lungs. This could be due to a viral or bacterial infection, or inflammation. We will prescribe an antibiotic with anti-inflammatory properties to help address any potential bacterial infection and reduce inflammation. Additionally, we will prescribe an inhaler to help reduce your cough.  INSTRUCTIONS:  Please make sure to pick up and start taking the occasions prescribed. Use the antibiotic and inhaler as directed. If your symptoms do not improve or worsen, please seek medical attention immediately.

## 2024-07-04 NOTE — Progress Notes (Signed)
 Subjective:    Patient ID: Monica Gonzalez, female    DOB: [birth year], 48 y.o.   MRN: [id number]  Patient Care Team: Epifanio Alm SQUIBB, MD as PCP - General (Infectious Diseases) Maurie Rayfield BIRCH, RN as Oncology Nurse Navigator Babara Call, MD as Consulting Physician (Oncology) Lenn Aran, MD as Consulting Physician (Radiation Oncology) Georgina Shasta POUR, RN as Oncology Nurse Navigator  Chief Complaint  Patient presents with   Asthma    Cough, shortness of breath and wheezing.     BACKGROUND: Patient is a 48 year old lifelong never smoker with a history as noted below, who presents for evaluation of shortness of breath and cough with occasional wheezing.  Symptoms have been present for approximately 5 days.  Patient is kindly referred by Dr. Call Babara.   HPI Discussed the use of AI scribe software for clinical note transcription with the patient, who gave verbal consent to proceed.  History of Present Illness   Monica Gonzalez is a 48 year old female with a history of COVID pneumonia who presents with worsening cough and shortness of breath. She was referred by her oncologist for evaluation of her respiratory symptoms.  She has an underlying history of breast and anal cancer.  She has been experiencing a worsening cough and shortness of breath over the past four days. Initially, she was just 'wheezing' but each day her symptoms have worsened, leading to significant weakness and pain in her lungs, particularly in the back. The cough is mostly dry, with one instance of coughing up yellow sputum last night. Her current symptoms are similar to those she experienced during her previous COVID pneumonia.  She visited the ER on Sunday where a chest x-ray was performed, but no significant findings were reported. However, she feels her shortness of breath has worsened since then. She consulted her cardiologist yesterday, who performed a test for congestive heart failure and prescribed a diuretic, which  she has yet to obtain.   She has a history of lupus and was recently diagnosed with two different types of cancer. She has experienced mild leg swelling and notes that prednisone causes her whole body to swell. In the ER, she was given cough syrup, a refill of her emergency inhaler, and two small pills, which she believes were Decadron . These medications raised her blood pressure and did not significantly alleviate her symptoms.  She has a nebulizer at home and has been using her inhaler, cough syrup, and cough drops, but finds it difficult to rest due to her symptoms. When she develops an upper respiratory issue, it often progresses rapidly to bronchitis or pneumonia.    She has not had any hemoptysis.  No fevers or chills per se.  Has had generalized malaise.  Does not endorse any other symptoms.  Review of Systems A 10 point review of systems was performed and it is as noted above otherwise negative.   Past Medical History:  Diagnosis Date   Anemia    Anginal pain (HCC)    Anxiety    Arthritis    Asthma    Cancer (HCC)    Depression    Dyspnea    Fibromyalgia    GERD (gastroesophageal reflux disease)    Headache    Heart murmur    History of hiatal hernia    History of kidney stones    Lupus    Malignant neoplasm of upper-outer quadrant of right female breast, unspecified estrogen receptor status (HCC) 2025   Pneumonia  PTSD (post-traumatic stress disorder)     Past Surgical History:  Procedure Laterality Date   BREAST BIOPSY Right 06/18/2024   US  RT BREAST BX W LOC DEV 1ST LESION IMG BX SPEC US  GUIDE 06/18/2024 [facility]   BREAST BIOPSY Right 06/18/2024   US  RT BREAST BX W LOC DEV EA ADD LESION IMG BX SPEC US  GUIDE 06/18/2024 [facility]   BREAST BIOPSY Right 06/18/2024   US  RT BREAST BX W LOC DEV EA ADD LESION IMG BX SPEC US  GUIDE 06/18/2024 [facility]   COLONOSCOPY WITH PROPOFOL  N/A 02/17/2023   Procedure: COLONOSCOPY WITH PROPOFOL ;  Surgeon: Maryruth Ole DASEN, MD;  Location: [facility];  Service: Endoscopy;  Laterality: N/A;   ESOPHAGOGASTRODUODENOSCOPY (EGD) WITH PROPOFOL  N/A 02/17/2023   Procedure: ESOPHAGOGASTRODUODENOSCOPY (EGD) WITH PROPOFOL ;  Surgeon: Maryruth Ole DASEN, MD;  Location: [facility];  Service: Endoscopy;  Laterality: N/A;   EXTRACORPOREAL SHOCK WAVE LITHOTRIPSY     RECTAL EXAM UNDER ANESTHESIA N/A 05/16/2024   Procedure: EXAM UNDER ANESTHESIA, RECTUM, INCISIONAL BIOPSY OF ANAL MASS;  Surgeon: Tye Millet, DO;  Location: [facility];  Service: General;  Laterality: N/A;   TONSILLECTOMY     TUBAL LIGATION     VULVECTOMY N/A 07/29/2020   Procedure: WIDE EXCISION VULVECTOMY;  Surgeon: Mancil Barter, MD;  Location: [facility];  Service: Gynecology;  Laterality: N/A;    Patient Active Problem List   Diagnosis Date Noted   Invasive carcinoma of breast (HCC) 06/26/2024   Breast mass in female 06/13/2024   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   Skin candidiasis 05/29/2024   Painful cervical range of motion 07/24/2023   Impaired range of motion of cervical spine 07/24/2023   History of Prednisone Allergy 07/04/2023    Class: History of   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, C5-6) 06/07/2023   Cervical sensory radiculopathy (Left) 06/07/2023   Chronic upper extremity pain (Left) 06/07/2023   Shoulder radicular pain (Left) 06/07/2023   Chronic neck pain (1ry area of Pain) (Bilateral) (L>R) 05/22/2023   Chronic shoulder pain (2ry area of Pain) (Left) 05/22/2023   Chronic low back pain (3ry area of Pain) (Bilateral) (R>L) w/o sciatica 05/22/2023   Cervicogenic headache (4th area of Pain) (Bilateral) 05/22/2023   Chronic feet pain (5th area of Pain) (Bilateral) 05/22/2023   Abnormal MRI, cervical spine (01/16/2023) 05/22/2023   DDD (degenerative disc disease), cervical 05/22/2023   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,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) 05/22/2023   Cervical facet hypertrophy 05/22/2023   Grade 1  Retrolisthesis of C4/C5 & C5/C6 05/22/2023   Hand fracture, sequela (Right) (06/09/2015) 05/22/2023   Chronic pain syndrome 05/21/2023   Pharmacologic therapy 05/21/2023   Disorder of skeletal system 05/21/2023   Problems influencing health status 05/21/2023   Fibromyalgia 02/07/2023   Colles' fracture of radius, sequela (Right) 12/29/2022   Palpitations 07/23/2020   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   Atypical chest pain 06/29/2020   Family history of premature CAD 06/29/2020   Asthma 06/13/2019   Hiatal hernia 06/13/2019   History of heart murmur in childhood 06/13/2019   Lupus (systemic lupus erythematosus) (HCC) 06/13/2019    Family History  Problem Relation Age of Onset   Diabetes Mother    Congestive Heart Failure Mother    Lupus Maternal Grandmother     Social History   Tobacco Use   Smoking status: Never   Smokeless tobacco: Never  Substance Use Topics   Alcohol use: Not Currently    Allergies  Allergen Reactions   Prednisone  Shortness Of Breath and Swelling    Patient has taken methylprednisolone  (Depo-Medrol ) and dexamethasone  (Decadron ) without any documented side effects or adverse reactions. She describes that she used to take prednisone due to her bronchial asthma and respiratory problems, but later on she received some prednisone which into locations caused her to have significant swelling and shortness of breath. For that reason, she was told to avoid the use of prednisone.    Current Meds  Medication Sig   albuterol  (PROVENTIL ) (2.5 MG/3ML) 0.083% nebulizer solution Take 2.5 mg by nebulization every 6 (six) hours as needed for shortness of breath or wheezing.   albuterol  (VENTOLIN  HFA) 108 (90 Base) MCG/ACT inhaler Inhale 2 puffs into the lungs every 6 (six) hours as needed for wheezing or shortness of breath.   albuterol  (VENTOLIN  HFA) 108 (90 Base) MCG/ACT inhaler Inhale 2 puffs into the lungs every 4 (four) hours as needed for  wheezing or shortness of breath.   ascorbic acid (VITAMIN C) 500 MG tablet Take 500 mg by mouth daily.   azithromycin  (ZITHROMAX ) 250 MG tablet Take 2 tablets (500 mg) on  Day 1,  followed by 1 tablet (250 mg) once daily on Days 2 through 5.   B Complex-C (SUPER B COMPLEX PO) Take by mouth daily.   benzonatate  (TESSALON ) 200 MG capsule Take 1 capsule (200 mg total) by mouth 3 (three) times daily as needed for cough.   BIOTIN PO Take by mouth daily.   busPIRone (BUSPAR) 5 MG tablet Take 5 mg by mouth 2 (two) times daily.   celecoxib (CELEBREX) 200 MG capsule Take 200 mg by mouth 2 (two) times daily.   cholecalciferol (VITAMIN D3) 25 MCG (1000 UNIT) tablet Take 1,000 Units by mouth daily.   clonazePAM (KLONOPIN) 0.5 MG tablet Take 0.5 mg by mouth at bedtime as needed (anxiety/sleep).   dexamethasone  (DECADRON ) 6 MG tablet Take 2 tablets (12 mg total) by mouth once as needed for up to 1 dose (shortness of breath). Take a second dose of Decadron  on 07/03/2024 if you feel you are persistently having wheezing and shortness of breath. (You received your first dose in the emergency department on 07/01/2024.)   DULoxetine (CYMBALTA) 30 MG capsule Take 30 mg by mouth at bedtime.   ferrous sulfate 325 (65 FE) MG tablet Take 325 mg by mouth daily.    gabapentin (NEURONTIN) 300 MG capsule Take 600 mg by mouth 2 (two) times daily.   HYDROcodone -acetaminophen  (NORCO/VICODIN) 5-325 MG tablet Take 1 tablet by mouth every 6 (six) hours as needed for moderate pain (pain score 4-6).   ibuprofen  (ADVIL ) 800 MG tablet Take 800 mg by mouth every 8 (eight) hours as needed.   ipratropium (ATROVENT  HFA) 17 MCG/ACT inhaler Inhale 2 puffs into the lungs every 6 (six) hours as needed for wheezing (COUGH).   lidocaine  (LIDODERM ) 5 % 1 patch daily.   Multiple Vitamin (MULTIVITAMIN WITH MINERALS) TABS tablet Take 1 tablet by mouth daily.   naproxen  (EC NAPROSYN ) 500 MG EC tablet Take 1 tablet (500 mg total) by mouth 2 (two) times  daily with a meal.   nystatin  (MYCOSTATIN /NYSTOP ) powder Apply 1 Application topically 2 (two) times daily.   pantoprazole (PROTONIX) 40 MG tablet Take by mouth.   SUMAtriptan (IMITREX) 50 MG tablet Take 50 mg by mouth every 2 (two) hours as needed for migraine.   traMADol  (ULTRAM ) 50 MG tablet Take 1 tablet (50 mg total) by mouth every 8 (eight) hours as needed.   traZODone (  DESYREL) 50 MG tablet Take 50 mg by mouth at bedtime.   Turmeric (QC TUMERIC COMPLEX PO) Take by mouth daily.     There is no immunization history on file for this patient.      Objective:     BP (!) 132/90   Pulse 86   Temp 98.3 F (36.8 C) (Oral)   Ht 4' 11 (1.499 m)   Wt 191 lb 3.2 oz (86.7 kg)   LMP 06/26/2024 (Approximate) Comment: NEGATIVE PREG TEST 06/30/2024  SpO2 96%   BMI 38.62 kg/m   SpO2: 96 %  GENERAL: Well-developed, obese woman, no acute distress.  Fully ambulatory. HEAD: Normocephalic, atraumatic.  EYES: Pupils equal, round, reactive to light.  No scleral icterus.  MOUTH: Poor dentition, some missing teeth.  Oral mucosa moist.  No thrush. NECK: Supple. No thyromegaly. Trachea midline. No JVD.  No adenopathy. PULMONARY: Good air entry bilaterally.  Coarse, otherwise, no adventitious sounds. CARDIOVASCULAR: S1 and S2. Regular rate and rhythm.  No rubs, murmurs or gallops heard. ABDOMEN: Obese otherwise benign MUSCULOSKELETAL: No joint deformity, no clubbing, no edema.  NEUROLOGIC: No overt focal deficit, no gait disturbance, speech is fluent. SKIN: Intact,warm,dry. PSYCH: Mood and behavior normal.  Lab Results  Component Value Date   NITRICOXIDE 12 07/04/2024  *No evidence of type II inflammation.      Assessment & Plan:     ICD-10-CM   1. Acute cough  R05.1     2. Acute bronchitis, unspecified organism  J20.9      Meds ordered this encounter  Medications   azithromycin  (ZITHROMAX ) 250 MG tablet    Sig: Take 2 tablets (500 mg) on  Day 1,  followed by 1 tablet (250 mg)  once daily on Days 2 through 5.    Dispense:  6 each    Refill:  0   ipratropium (ATROVENT  HFA) 17 MCG/ACT inhaler    Sig: Inhale 2 puffs into the lungs every 6 (six) hours as needed for wheezing (COUGH).    Dispense:  12.9 g    Refill:  1   benzonatate  (TESSALON ) 200 MG capsule    Sig: Take 1 capsule (200 mg total) by mouth 3 (three) times daily as needed for cough.    Dispense:  30 capsule    Refill:  1   Discussion:    Acute cough and shortness of breath Acute onset of cough and shortness of breath over the past four days, accompanied by weakness and pleuritic pain, particularly in the back. She has a history of COVID pneumonia. Recent ER visit included a chest x-ray with no significant findings.  Negative COVID/flu.  Cardiology evaluation revealed poor lung sounds and elevated congestive heart markers, leading to diuretic prescription. The cough is mostly dry with occasional yellow sputum. Differential diagnosis includes viral infection, given the lack of significant elevation in blood count and symptomatology. Consideration of bacterial infection or inflammation is also warranted. - Prescribe azithromycin  for antibiotic/anti-inflammatory properties. - Prescribe an inhaler to reduce cough tendency.      Advised if symptoms do not improve or worsen, to please contact office for sooner follow up or seek emergency care.    I spent 45 minutes of dedicated to the care of this patient on the date of this encounter to include pre-visit review of records, face-to-face time with the patient discussing conditions above, post visit ordering of testing, clinical documentation with the electronic health record, making appropriate referrals as documented, and communicating necessary findings to members  of the patients care team.   KYM Leita Sanders, MD Advanced Bronchoscopy [facility]    *This note was dictated using voice recognition software/Dragon.  Despite best efforts  to proofread, errors can occur which can change the meaning. Any transcriptional errors that result from this process are unintentional and may not be fully corrected at the time of dictation.

## 2024-07-05 ENCOUNTER — Other Ambulatory Visit: Payer: Self-pay

## 2024-07-05 ENCOUNTER — Ambulatory Visit
Admission: RE | Admit: 2024-07-05 | Discharge: 2024-07-05 | Disposition: A | Discharge status: Home / Self Care | Source: Ambulatory Visit | Attending: Surgery | Admitting: Surgery

## 2024-07-05 DIAGNOSIS — C50411 Malignant neoplasm of upper-outer quadrant of right female breast: Secondary | ICD-10-CM | POA: Insufficient documentation

## 2024-07-05 HISTORY — PX: BREAST BIOPSY: SHX20

## 2024-07-05 MED ORDER — LIDOCAINE HCL 1 % IJ SOLN
5.0000 mL | Freq: Once | INTRAMUSCULAR | Status: AC
  Administered 2024-07-05: 5 mL
  Filled 2024-07-05: qty 5

## 2024-07-08 ENCOUNTER — Other Ambulatory Visit: Payer: Self-pay

## 2024-07-08 ENCOUNTER — Ambulatory Visit
Admission: RE | Admit: 2024-07-08 | Discharge: 2024-07-08 | Disposition: A | Discharge status: Home / Self Care | Source: Ambulatory Visit | Attending: Surgery | Admitting: Surgery

## 2024-07-08 ENCOUNTER — Encounter: Payer: Self-pay | Admitting: Surgery

## 2024-07-08 ENCOUNTER — Ambulatory Visit
Admission: RE | Admit: 2024-07-08 | Discharge: 2024-07-08 | Disposition: A | Discharge status: Home / Self Care | Attending: Surgery | Admitting: Surgery

## 2024-07-08 ENCOUNTER — Encounter
Admission: RE | Disposition: A | Discharge status: Home / Self Care | Payer: Self-pay | Source: Home / Self Care | Attending: Surgery

## 2024-07-08 ENCOUNTER — Ambulatory Visit: Payer: Self-pay | Admitting: Urgent Care

## 2024-07-08 ENCOUNTER — Encounter: Payer: Self-pay | Admitting: Oncology

## 2024-07-08 ENCOUNTER — Ambulatory Visit

## 2024-07-08 DIAGNOSIS — J449 Chronic obstructive pulmonary disease, unspecified: Secondary | ICD-10-CM | POA: Insufficient documentation

## 2024-07-08 DIAGNOSIS — M329 Systemic lupus erythematosus, unspecified: Secondary | ICD-10-CM | POA: Insufficient documentation

## 2024-07-08 DIAGNOSIS — C50411 Malignant neoplasm of upper-outer quadrant of right female breast: Secondary | ICD-10-CM | POA: Diagnosis present

## 2024-07-08 DIAGNOSIS — M797 Fibromyalgia: Secondary | ICD-10-CM | POA: Diagnosis not present

## 2024-07-08 DIAGNOSIS — K219 Gastro-esophageal reflux disease without esophagitis: Secondary | ICD-10-CM | POA: Insufficient documentation

## 2024-07-08 DIAGNOSIS — C210 Malignant neoplasm of anus, unspecified: Secondary | ICD-10-CM | POA: Insufficient documentation

## 2024-07-08 DIAGNOSIS — J45909 Unspecified asthma, uncomplicated: Secondary | ICD-10-CM | POA: Diagnosis not present

## 2024-07-08 DIAGNOSIS — C773 Secondary and unspecified malignant neoplasm of axilla and upper limb lymph nodes: Secondary | ICD-10-CM | POA: Diagnosis not present

## 2024-07-08 DIAGNOSIS — Z01812 Encounter for preprocedural laboratory examination: Secondary | ICD-10-CM

## 2024-07-08 DIAGNOSIS — Z87891 Personal history of nicotine dependence: Secondary | ICD-10-CM | POA: Diagnosis not present

## 2024-07-08 HISTORY — PX: SENTINEL NODE BIOPSY: SHX6608

## 2024-07-08 HISTORY — PX: PORTACATH PLACEMENT: SHX2246

## 2024-07-08 HISTORY — PX: BREAST LUMPECTOMY WITH RADIO FREQUENCY LOCALIZER: SHX6897

## 2024-07-08 SURGERY — INSERTION, TUNNELED CENTRAL VENOUS DEVICE, WITH PORT
Anesthesia: General | Site: Chest | Laterality: Right

## 2024-07-08 MED ORDER — PROPOFOL 500 MG/50ML IV EMUL
INTRAVENOUS | Status: DC | PRN
  Administered 2024-07-08: 25 ug/kg/min via INTRAVENOUS

## 2024-07-08 MED ORDER — FENTANYL CITRATE (PF) 100 MCG/2ML IJ SOLN
INTRAMUSCULAR | Status: AC
  Filled 2024-07-08: qty 2

## 2024-07-08 MED ORDER — CHLORHEXIDINE GLUCONATE CLOTH 2 % EX PADS
6.0000 | MEDICATED_PAD | Freq: Once | CUTANEOUS | Status: AC
  Administered 2024-07-08: 6 via TOPICAL

## 2024-07-08 MED ORDER — CEFAZOLIN SODIUM-DEXTROSE 2-4 GM/100ML-% IV SOLN
2.0000 g | INTRAVENOUS | Status: AC
  Administered 2024-07-08: 2 g via INTRAVENOUS

## 2024-07-08 MED ORDER — PROPOFOL 10 MG/ML IV BOLUS
INTRAVENOUS | Status: DC | PRN
  Administered 2024-07-08: 160 mg via INTRAVENOUS
  Administered 2024-07-08: 30 mg via INTRAVENOUS

## 2024-07-08 MED ORDER — MIDAZOLAM HCL 2 MG/2ML IJ SOLN
INTRAMUSCULAR | Status: AC
Start: 2024-07-08 — End: 2024-07-08
  Filled 2024-07-08: qty 2

## 2024-07-08 MED ORDER — DOCUSATE SODIUM 100 MG PO CAPS
100.0000 mg | ORAL_CAPSULE | Freq: Two times a day (BID) | ORAL | 0 refills | Status: AC | PRN
  Filled 2024-07-08: qty 20, 10d supply, fill #0

## 2024-07-08 MED ORDER — BUPIVACAINE-EPINEPHRINE 0.5% -1:200000 IJ SOLN
INTRAMUSCULAR | Status: DC | PRN
  Administered 2024-07-08: 10 mL via INTRAMUSCULAR

## 2024-07-08 MED ORDER — OXYCODONE HCL 5 MG/5ML PO SOLN: 5.0000 mg | Freq: Once | ORAL | Status: AC | PRN

## 2024-07-08 MED ORDER — CHLORHEXIDINE GLUCONATE 0.12 % MT SOLN
15.0000 mL | Freq: Once | OROMUCOSAL | Status: AC
  Administered 2024-07-08: 15 mL via OROMUCOSAL

## 2024-07-08 MED ORDER — FENTANYL CITRATE (PF) 100 MCG/2ML IJ SOLN
INTRAMUSCULAR | Status: AC
Start: 2024-07-08 — End: 2024-07-08
  Filled 2024-07-08: qty 2

## 2024-07-08 MED ORDER — DEXAMETHASONE SODIUM PHOSPHATE 10 MG/ML IJ SOLN
INTRAMUSCULAR | Status: AC
  Filled 2024-07-08: qty 1

## 2024-07-08 MED ORDER — PROPOFOL 10 MG/ML IV BOLUS
INTRAVENOUS | Status: AC
  Filled 2024-07-08: qty 40

## 2024-07-08 MED ORDER — ONDANSETRON HCL 4 MG/2ML IJ SOLN
INTRAMUSCULAR | Status: DC | PRN
  Administered 2024-07-08: 4 mg via INTRAVENOUS

## 2024-07-08 MED ORDER — HEPARIN SOD (PORK) LOCK FLUSH 100 UNIT/ML IV SOLN
INTRAVENOUS | Status: AC
  Filled 2024-07-08: qty 5

## 2024-07-08 MED ORDER — GLYCOPYRROLATE 0.2 MG/ML IJ SOLN
INTRAMUSCULAR | Status: AC
  Filled 2024-07-08: qty 1

## 2024-07-08 MED ORDER — FENTANYL CITRATE (PF) 100 MCG/2ML IJ SOLN
INTRAMUSCULAR | Status: DC | PRN
  Administered 2024-07-08: 50 ug via INTRAVENOUS
  Administered 2024-07-08 (×2): 25 ug via INTRAVENOUS
  Administered 2024-07-08: 50 ug via INTRAVENOUS

## 2024-07-08 MED ORDER — PHENYLEPHRINE HCL-NACL 20-0.9 MG/250ML-% IV SOLN
INTRAVENOUS | Status: DC | PRN
  Administered 2024-07-08: 30 ug/min via INTRAVENOUS

## 2024-07-08 MED ORDER — TECHNETIUM TC 99M TILMANOCEPT KIT
1.0700 | PACK | Freq: Once | INTRAVENOUS | Status: AC | PRN
  Administered 2024-07-08: 1.07 via INTRADERMAL

## 2024-07-08 MED ORDER — ROCURONIUM BROMIDE 100 MG/10ML IV SOLN
INTRAVENOUS | Status: DC | PRN
  Administered 2024-07-08: 20 mg via INTRAVENOUS
  Administered 2024-07-08: 50 mg via INTRAVENOUS

## 2024-07-08 MED ORDER — BUPIVACAINE-EPINEPHRINE (PF) 0.5% -1:200000 IJ SOLN
INTRAMUSCULAR | Status: AC
  Filled 2024-07-08: qty 10

## 2024-07-08 MED ORDER — ALBUTEROL SULFATE HFA 108 (90 BASE) MCG/ACT IN AERS
INHALATION_SPRAY | RESPIRATORY_TRACT | Status: DC | PRN
Start: 2024-07-08 — End: 2024-07-08
  Administered 2024-07-08: 4 via RESPIRATORY_TRACT
  Administered 2024-07-08: 2 via RESPIRATORY_TRACT

## 2024-07-08 MED ORDER — LIDOCAINE HCL (CARDIAC) PF 100 MG/5ML IV SOSY
PREFILLED_SYRINGE | INTRAVENOUS | Status: DC | PRN
  Administered 2024-07-08: 60 mg via INTRAVENOUS

## 2024-07-08 MED ORDER — ORAL CARE MOUTH RINSE: 15.0000 mL | Freq: Once | OROMUCOSAL | Status: AC

## 2024-07-08 MED ORDER — LIDOCAINE HCL (PF) 2 % IJ SOLN
INTRAMUSCULAR | Status: AC
  Filled 2024-07-08: qty 5

## 2024-07-08 MED ORDER — PHENYLEPHRINE 80 MCG/ML (10ML) SYRINGE FOR IV PUSH (FOR BLOOD PRESSURE SUPPORT)
PREFILLED_SYRINGE | INTRAVENOUS | Status: AC
  Filled 2024-07-08: qty 10

## 2024-07-08 MED ORDER — ACETAMINOPHEN 10 MG/ML IV SOLN
INTRAVENOUS | Status: DC | PRN
  Administered 2024-07-08: 1000 mg via INTRAVENOUS

## 2024-07-08 MED ORDER — HEPARIN SOD (PORK) LOCK FLUSH 100 UNIT/ML IV SOLN
INTRAVENOUS | Status: DC | PRN
  Administered 2024-07-08: 500 [IU] via INTRAVENOUS

## 2024-07-08 MED ORDER — DEXMEDETOMIDINE HCL IN NACL 80 MCG/20ML IV SOLN
INTRAVENOUS | Status: DC | PRN
Start: 2024-07-08 — End: 2024-07-08
  Administered 2024-07-08: 8 ug via INTRAVENOUS
  Administered 2024-07-08: 4 ug via INTRAVENOUS
  Administered 2024-07-08: 8 ug via INTRAVENOUS

## 2024-07-08 MED ORDER — PHENYLEPHRINE 80 MCG/ML (10ML) SYRINGE FOR IV PUSH (FOR BLOOD PRESSURE SUPPORT): PREFILLED_SYRINGE | INTRAVENOUS | Status: DC | PRN

## 2024-07-08 MED ORDER — LACTATED RINGERS IV SOLN: INTRAVENOUS | Status: DC

## 2024-07-08 MED ORDER — ACETAMINOPHEN 325 MG PO TABS
650.0000 mg | ORAL_TABLET | Freq: Three times a day (TID) | ORAL | 0 refills | Status: AC | PRN
  Filled 2024-07-08: qty 40, 7d supply, fill #0

## 2024-07-08 MED ORDER — STERILE WATER FOR IRRIGATION IR SOLN
Status: DC | PRN
  Administered 2024-07-08: 500 mL

## 2024-07-08 MED ORDER — OXYCODONE HCL 5 MG PO TABS
5.0000 mg | ORAL_TABLET | Freq: Once | ORAL | Status: AC | PRN
  Administered 2024-07-08: 5 mg via ORAL

## 2024-07-08 MED ORDER — SUGAMMADEX SODIUM 200 MG/2ML IV SOLN
INTRAVENOUS | Status: DC | PRN
  Administered 2024-07-08 (×2): 200 mg via INTRAVENOUS

## 2024-07-08 MED ORDER — FENTANYL CITRATE (PF) 100 MCG/2ML IJ SOLN: 25.0000 ug | INTRAMUSCULAR | Status: DC | PRN

## 2024-07-08 MED ORDER — CEFAZOLIN SODIUM-DEXTROSE 2-4 GM/100ML-% IV SOLN
INTRAVENOUS | Status: AC
  Filled 2024-07-08: qty 100

## 2024-07-08 MED ORDER — TRAMADOL HCL 50 MG PO TABS
50.0000 mg | ORAL_TABLET | Freq: Three times a day (TID) | ORAL | 0 refills | Status: DC | PRN
  Filled 2024-07-08: qty 15, 5d supply, fill #0

## 2024-07-08 MED ORDER — ONDANSETRON HCL 4 MG/2ML IJ SOLN
INTRAMUSCULAR | Status: AC
  Filled 2024-07-08: qty 2

## 2024-07-08 MED ORDER — PROPOFOL 1000 MG/100ML IV EMUL
INTRAVENOUS | Status: AC
  Filled 2024-07-08: qty 100

## 2024-07-08 MED ORDER — BUPIVACAINE-EPINEPHRINE 0.5% -1:200000 IJ SOLN
INTRAMUSCULAR | Status: DC | PRN
  Administered 2024-07-08: 20 mL

## 2024-07-08 MED ORDER — OXYCODONE HCL 5 MG PO TABS
ORAL_TABLET | ORAL | Status: AC
Start: 2024-07-08 — End: 2024-07-08
  Filled 2024-07-08: qty 1

## 2024-07-08 MED ORDER — ACETAMINOPHEN 10 MG/ML IV SOLN
INTRAVENOUS | Status: AC
  Filled 2024-07-08: qty 100

## 2024-07-08 MED ORDER — MIDAZOLAM HCL 2 MG/2ML IJ SOLN
INTRAMUSCULAR | Status: DC | PRN
  Administered 2024-07-08: 2 mg via INTRAVENOUS

## 2024-07-08 MED ORDER — PHENYLEPHRINE HCL (PRESSORS) 10 MG/ML IV SOLN
INTRAVENOUS | Status: DC | PRN
  Administered 2024-07-08 (×2): 80 ug via INTRAVENOUS
  Administered 2024-07-08: 40 ug via INTRAVENOUS
  Administered 2024-07-08 (×3): 80 ug via INTRAVENOUS

## 2024-07-08 MED ORDER — CHLORHEXIDINE GLUCONATE 0.12 % MT SOLN
OROMUCOSAL | Status: AC
  Filled 2024-07-08: qty 15

## 2024-07-08 MED ORDER — ROCURONIUM BROMIDE 10 MG/ML (PF) SYRINGE
PREFILLED_SYRINGE | INTRAVENOUS | Status: AC
  Filled 2024-07-08: qty 10

## 2024-07-08 MED ORDER — ALBUTEROL SULFATE HFA 108 (90 BASE) MCG/ACT IN AERS
INHALATION_SPRAY | RESPIRATORY_TRACT | Status: AC
  Filled 2024-07-08: qty 6.7

## 2024-07-08 SURGICAL SUPPLY — 46 items
BAG DECANTER FOR FLEXI CONT (MISCELLANEOUS) ×2 IMPLANT
BLADE PHOTON ILLUMINATED (MISCELLANEOUS) ×2 IMPLANT
BLADE SURG 15 STRL LF DISP TIS (BLADE) ×2 IMPLANT
BLADE SURG SZ11 CARB STEEL (BLADE) ×2 IMPLANT
CHLORAPREP W/TINT 26 (MISCELLANEOUS) IMPLANT
CLAMP SUTURE YELLOW 5 PAIRS (MISCELLANEOUS) ×2 IMPLANT
COVER LIGHT HANDLE STERIS (MISCELLANEOUS) ×4 IMPLANT
COVER PROBE GAMMA FINDER SLV (MISCELLANEOUS) ×2 IMPLANT
DERMABOND ADVANCED .7 DNX12 (GAUZE/BANDAGES/DRESSINGS) ×2 IMPLANT
DEVICE DUBIN SPECIMEN MAMMOGRA (MISCELLANEOUS) ×2 IMPLANT
DRAPE C-ARM XRAY 36X54 (DRAPES) ×2 IMPLANT
DRAPE LAPAROTOMY 77X122 PED (DRAPES) IMPLANT
DRAPE LAPAROTOMY TRNSV 106X77 (MISCELLANEOUS) ×2 IMPLANT
DRSG TEGADERM 4X4.75 (GAUZE/BANDAGES/DRESSINGS) ×2 IMPLANT
ELECTRODE REM PT RTRN 9FT ADLT (ELECTROSURGICAL) ×2 IMPLANT
GAUZE 4X4 16PLY ~~LOC~~+RFID DBL (SPONGE) IMPLANT
GLOVE BIOGEL PI IND STRL 7.0 (GLOVE) ×2 IMPLANT
GLOVE SURG SYN 6.5 PF PI (GLOVE) ×6 IMPLANT
GOWN STRL REUS W/ TWL LRG LVL3 (GOWN DISPOSABLE) ×6 IMPLANT
IV NS 500ML BAXH (IV SOLUTION) ×2 IMPLANT
KIT MARKER MARGIN INK (KITS) ×2 IMPLANT
KIT PORT INFUSION SMART 8FR (Port) ×2 IMPLANT
KIT TURNOVER KIT A (KITS) ×2 IMPLANT
LABEL OR SOLS (LABEL) ×2 IMPLANT
LIGHT WAVEGUIDE WIDE FLAT (MISCELLANEOUS) IMPLANT
MANIFOLD NEPTUNE II (INSTRUMENTS) ×2 IMPLANT
MARKER MARGIN CORRECT CLIP (MARKER) ×2 IMPLANT
NDL HYPO 22X1.5 SAFETY MO (MISCELLANEOUS) ×4 IMPLANT
NEEDLE HYPO 22X1.5 SAFETY MO (MISCELLANEOUS) ×4 IMPLANT
PACK BASIN MINOR ARMC (MISCELLANEOUS) ×2 IMPLANT
PACK PORT-A-CATH (MISCELLANEOUS) ×2 IMPLANT
SHEATH BREAST BIOPSY SKIN MKR (SHEATH) ×2 IMPLANT
SPIKE FLUID TRANSFER (MISCELLANEOUS) ×2 IMPLANT
SUT MNCRL AB 4-0 PS2 18 (SUTURE) ×2 IMPLANT
SUT PROLENE 2 0 SH DA (SUTURE) ×2 IMPLANT
SUT SILK 2-0 30XBRD TIE 12 (SUTURE) IMPLANT
SUT SILK 3 0 12 30 (SUTURE) IMPLANT
SUT VIC AB 3-0 SH 27X BRD (SUTURE) ×2 IMPLANT
SUTURE MNCRL 4-0 27XMF (SUTURE) ×2 IMPLANT
SYR 10ML LL (SYRINGE) ×2 IMPLANT
SYR 20ML LL LF (SYRINGE) ×2 IMPLANT
TOWEL OR 17X26 4PK STRL BLUE (TOWEL DISPOSABLE) IMPLANT
TRAP FLUID SMOKE EVACUATOR (MISCELLANEOUS) ×2 IMPLANT
TRAP NEPTUNE SPECIMEN COLLECT (MISCELLANEOUS) ×2 IMPLANT
WATER STERILE IRR 1000ML POUR (IV SOLUTION) ×2 IMPLANT
WATER STERILE IRR 500ML POUR (IV SOLUTION) ×2 IMPLANT

## 2024-07-08 NOTE — Anesthesia Preprocedure Evaluation (Signed)
 Anesthesia Evaluation  Patient identified by MRN, date of birth, ID band Patient awake    Reviewed: Allergy & Precautions, NPO status , Patient's Chart, lab work & pertinent test results  History of Anesthesia Complications Negative for: history of anesthetic complications  Airway Mallampati: III  TM Distance: <3 FB Neck ROM: full    Dental  (+) Chipped, Poor Dentition, Missing   Pulmonary shortness of breath and with exertion, asthma , Recent URI , Residual Cough    + decreased breath sounds      Cardiovascular (-) angina Normal cardiovascular exam+ Valvular Problems/Murmurs      Neuro/Psych  Headaches  Neuromuscular disease  negative psych ROS   GI/Hepatic Neg liver ROS, hiatal hernia,GERD  Controlled,,  Endo/Other  negative endocrine ROS    Renal/GU      Musculoskeletal   Abdominal   Peds  Hematology negative hematology ROS (+)   Anesthesia Other Findings Past Medical History: No date: Anemia No date: Anginal pain (HCC) No date: Anxiety No date: Arthritis No date: Asthma No date: Cancer (HCC) No date: Depression No date: Dyspnea No date: Fibromyalgia No date: GERD (gastroesophageal reflux disease) No date: Headache No date: Heart murmur No date: History of hiatal hernia No date: History of kidney stones No date: Lupus 2025: Malignant neoplasm of upper-outer quadrant of right female  breast, unspecified estrogen receptor status (HCC) No date: Pneumonia No date: PTSD (post-traumatic stress disorder)  Past Surgical History: 06/18/2024: BREAST BIOPSY; Right     Comment:  US  RT BREAST BX W LOC DEV 1ST LESION IMG BX SPEC US                GUIDE 06/18/2024 [facility] 06/18/2024: BREAST BIOPSY; Right     Comment:  US  RT BREAST BX W LOC DEV EA ADD LESION IMG BX SPEC US                GUIDE 06/18/2024 [facility] 06/18/2024: BREAST BIOPSY; Right     Comment:  US  RT BREAST BX W LOC DEV EA ADD LESION  IMG BX SPEC US                GUIDE 06/18/2024 [facility] 07/05/2024: BREAST BIOPSY; Right     Comment:  US  RT BREAST SAVI/RF TAG 1ST LESION US  GUIDE 07/05/2024               [facility] 07/05/2024: BREAST BIOPSY; Right     Comment:  US  RT BREAST SAVI/RF TAG EA ADD'L LESION US  GUIDE               07/05/2024 [facility] 02/17/2023: COLONOSCOPY WITH PROPOFOL ; N/A     Comment:  Procedure: COLONOSCOPY WITH PROPOFOL ;  Surgeon:               Maryruth Ole DASEN, MD;  Location: [facility];                Service: Endoscopy;  Laterality: N/A; 02/17/2023: ESOPHAGOGASTRODUODENOSCOPY (EGD) WITH PROPOFOL ; N/A     Comment:  Procedure: ESOPHAGOGASTRODUODENOSCOPY (EGD) WITH               PROPOFOL ;  Surgeon: Maryruth Ole DASEN, MD;  Location:               [facility];  Service: Endoscopy;  Laterality: N/A; No date: EXTRACORPOREAL SHOCK WAVE LITHOTRIPSY 05/16/2024: RECTAL EXAM UNDER ANESTHESIA; N/A     Comment:  Procedure: EXAM UNDER ANESTHESIA, RECTUM, INCISIONAL  BIOPSY OF ANAL MASS;  Surgeon: Tye Millet, DO;                Location: [facility];  Service: General;  Laterality: N/A; No date: TONSILLECTOMY No date: TUBAL LIGATION 07/29/2020: VULVECTOMY; N/A     Comment:  Procedure: WIDE EXCISION VULVECTOMY;  Surgeon: Mancil Barter, MD;  Location: [facility];  Service: Gynecology;                Laterality: N/A;  BMI    Body Mass Index: 38.62 kg/m      Reproductive/Obstetrics negative OB ROS                              Anesthesia Physical Anesthesia Plan  ASA: 3  Anesthesia Plan: General ETT   Post-op Pain Management:    Induction: Intravenous  PONV Risk Score and Plan: Ondansetron , Dexamethasone , Midazolam  and Treatment may vary due to age or medical condition  Airway Management Planned: Oral ETT  Additional Equipment:   Intra-op Plan:   Post-operative Plan: Extubation in OR  Informed Consent: I have  reviewed the patients History and Physical, chart, labs and discussed the procedure including the risks, benefits and alternatives for the proposed anesthesia with the patient or authorized representative who has indicated his/her understanding and acceptance.     Dental Advisory Given  Plan Discussed with: Anesthesiologist, CRNA and Surgeon  Anesthesia Plan Comments: (Patient has refused a pre operative urine pregnancy test. Patient was consented for risks to fetus if present, possible damage and or loss, from anesthesia and or the procedure. Patient voiced assent.   Patient consented for risks of anesthesia including but not limited to:  - adverse reactions to medications - damage to eyes, teeth, lips or other oral mucosa - nerve damage due to positioning  - sore throat or hoarseness - Damage to heart, brain, nerves, lungs, other parts of body or loss of life  Patient voiced understanding and assent.)        Anesthesia Quick Evaluation

## 2024-07-08 NOTE — Anesthesia Procedure Notes (Signed)
 Procedure Name: Intubation Date/Time: 07/08/2024 10:37 AM  Performed by: Shaydon Lease, CRNAPre-anesthesia Checklist: Patient identified, Emergency Drugs available, Suction available and Patient being monitored Patient Re-evaluated:Patient Re-evaluated prior to induction Oxygen Delivery Method: Circle System Utilized Preoxygenation: Pre-oxygenation with 100% oxygen Induction Type: IV induction Ventilation: Mask ventilation without difficulty Laryngoscope Size: Mac and 3 Grade View: Grade I Tube type: Oral Number of attempts: 1 Airway Equipment and Method: Stylet and Oral airway Placement Confirmation: ETT inserted through vocal cords under direct vision, positive ETCO2 and breath sounds checked- equal and bilateral Secured at: 21 cm Tube secured with: Tape Dental Injury: Teeth and Oropharynx as per pre-operative assessment  Comments: Easy, atraumatic intubation. Lips, teeth, and tongue unchanged. Head and neck midline

## 2024-07-08 NOTE — Discharge Instructions (Addendum)
 Removal, Care After This sheet gives you information about how to care for yourself after your procedure. Your health care provider may also give you more specific instructions. If you have problems or questions, contact your health care provider. What can I expect after the procedure? After the procedure, it is common to have: Soreness. Bruising. Itching. Follow these instructions at home: site care Follow instructions from your health care provider about how to take care of your site. Make sure you: Wash your hands with soap and water  before and after you change your bandage (dressing). If soap and water  are not available, use hand sanitizer. Leave stitches (sutures), skin glue, or adhesive strips in place. These skin closures may need to stay in place for 2 weeks or longer. If adhesive strip edges start to loosen and curl up, you may trim the loose edges. Do not remove adhesive strips completely unless your health care provider tells you to do that. If the area bleeds or bruises, apply gentle pressure for 10 minutes. OK TO SHOWER IN 24HRS  Check your site every day for signs of infection. Check for: Redness, swelling, or pain. Fluid or blood. Warmth. Pus or a bad smell.  General instructions Rest and then return to your normal activities as told by your health care provider.  tylenol  and naproxen  as needed for discomfort.  Please alternate between the two every four hours as needed for pain.    Use narcotics, if prescribed, only when tylenol  and naproxen  is not enough to control pain.  325-650mg  every 8hrs to max of 3000mg /24hrs (including the 325mg  in every norco dose) for the tylenol .     Keep all follow-up visits as told by your health care provider. This is important. Contact a health care provider if: You have redness, swelling, or pain around your site. You have fluid or blood coming from your site. Your site feels warm to the touch. You have pus or a bad smell coming from  your site. You have a fever. Your sutures, skin glue, or adhesive strips loosen or come off sooner than expected. Get help right away if: You have bleeding that does not stop with pressure or a dressing. Summary After the procedure, it is common to have some soreness, bruising, and itching at the site. Follow instructions from your health care provider about how to take care of your site. Check your site every day for signs of infection. Contact a health care provider if you have redness, swelling, or pain around your site, or your site feels warm to the touch. Keep all follow-up visits as told by your health care provider. This is important. This information is not intended to replace advice given to you by your health care provider. Make sure you discuss any questions you have with your health care provider. Document Released: 11/27/2015 Document Revised: 04/30/2018 Document Reviewed: 04/30/2018 Elsevier Interactive Patient Education  [employer].

## 2024-07-08 NOTE — Op Note (Signed)
 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   Procedure: SCOUT tag-localized right breast partial mastectomy.                      Right axillary Sentinel Lymph node biopsy, Port-A-Cath placement  Anesthesia: GETA  Surgeon: Dr. Henriette Pierre  Wound Classification: Clean  Indications: Patient is a 48 y.o. female with a nonpalpable right breast mass noted on mammography with core biopsy demonstrating above.  Requires SCOUT localizer placement, partial mastectomy for treatment with sentinel lymph node biopsy.  Concurrently diagnosed with anal cancer recently and in need of chemotherapy, therefore placing Port-A-Cath immediately after the breast case.  Specimen: Right breast mass, Sentinel Lymph nodes x 1, medial posterior margin  Complications: None  Estimated Blood Loss: 30 mL  Findings: 1. Specimen mammography shows marker and SCOUT localizer on specimen 2. Pathology call refers gross examination of margins was positive at the medial posterior aspect.  Margin resected 3. No other palpable mass or lymph node identified.  4.Patent easily compressible right IJ vein with appropriate respiratory variations and well-secured tunneled central venous catheter with subcutaneous port at completion of the procedure, heplocked after confirming ease of draw and push  Operation performed with curative intent:Yes  Tracer(s) used to identify sentinel nodes in the upfront surgery (non-neoadjuvant) setting (select all that apply):Radioactive Tracer  Tracer(s) used to identify sentinel nodes in the neoadjuvant setting (select all that apply):N/A  All nodes (colored or non-colored) present at the end of a dye-filled lymphatic channel were removed:N/A  All significantly radioactive nodes were removed:Yes  All palpable suspicious nodes were removed:Yes  Biopsy-proven positive nodes marked with clips prior to chemotherapy were identified and  removed:N/A    Description of procedure: SCOUT localization was performed by radiology prior to procedure. In the nuclear medicine suite, the subareolar region was injected with Tc-99 sulfur colloid the morning of procedure. Localization studies were reviewed. The patient was taken to the operating room and placed supine on the operating table, and after general anesthesia the right breast and axilla were prepped and draped in the usual sterile fashion. A time-out was completed verifying correct patient, procedure, site, positioning, and implant(s) and/or special equipment prior to beginning this procedure.  By identifying the SCOUT localizer, the probable trajectory and location of the mass was visualized. A skin incision was planned in such a way as to minimize the amount of dissection to reach the mass.  The skin incision was made after infusion of local. Flaps were raised and  Sharp and blunt dissection was then taken down to the mass, taking care to include the entire SCOUT localizer and a margin of grossly normal tissue. The specimen was removed. The specimen was oriented with paint. Imaging reviewed and the entire target lesion had been resected, with biopsy clip and localizer within the specimen.Gross margin analysis by pathology confirmed posterior inferior margin positive.  These margins were resected, painted and passed off operative field.    A hand-held gamma probe was used to identify the location of the hottest spot in the axilla. An incision was made around the caudal axillary hairline. Sharp and blunt Dissection was carried down to subdermal facias. The probe was placed within wound and again, the point of maximal count was found. Dissection continue until nodule was identified. The probe was placed in contact with the node and 2300 counts were recorded. The node was excised in its entirety.  the bed of the node measured  less than 200 counts. No additional hot spots were identified.  Clinically abnormal nodes visualized and removed with initial sentinel lymph node within 1 tissue specimen.   hemostasis was achieved and the wound closed in layers with  interrupted sutures of 3-0 Vicryl in deep dermal layer and a running subcuticular suture of Monocryl 4-0, then dressed with dermabond.  Sterile draping all removed and the right IJ area and right chest prepped and draped in the usual fashion.  In Trendelenburg position, venous access was obtained under ultrasound guidance using Seldinger technique, by access needle was inserted under direct ultrasound visualization through which soft guidewire was advanced.  Initial guidewire met some resistance and attempt at removing it met some with resistance.  After manipulation the wire was able to be removed with minimal resistance but it was noted to be bent proximal to the natural bend at the tip.  The above steps repeated after holding pressure over the initial access site for a few minutes with a new guidewire with no resistance, and access needle was withdrawn.  Fluoroscopy confirmed guidewire within SVC.  Guidewire was secured, attention was directed to injection of local anesthetic at the planned port site. 2-3 cm transverse right chest incision was made and confirmed to accommodate the subcutaneous port, and flushed catheter was tunneled retrograde from the port site over to the vein access site. Insertion sheath was advanced over the guidewire, which was withdrawn along with the insertion sheath dilator with no resistance. The catheter was introduced through the sheath and tip left in what appeared to be the Atrio [place] junction under fluoro guidance and catheter cut to appropriate length.  Catheter connected to port and placed within planned fixation site.  Fluro confirmed no kink within the entire length of the catheter at this poin.  Easy flush and pull through the port itself.  Port fixed to the pocket on two side to avoid twisting. Port was  confirmed again to withdraw blood and flush easily with included Heuber needle, and port heplocked. Dermis at the subcutaneous pocket was re-approximated using buried interrupted 3-0 Vicryl suture, and 4-0 Monocryl suture was used to re-approximate skin at the insertion and subcutaneous port sites in running subcuticular fashion.  Incisions then dressed with dermabond. Patient was then awakened from anesthesia and transferred to PACU in stable condition.  US  images saved in paper chart and fluoro images saved in Epic.  CXR post op confirmed no evidence of pneumothorax but the tip to be slightly retracted within the SVC.  The patient tolerated the procedure well and was taken to the postanesthesia care unit in stable condition. Sponge and instrument count correct at end of procedure.

## 2024-07-08 NOTE — Transfer of Care (Signed)
 Immediate Anesthesia Transfer of Care Note  Patient: Monica Gonzalez  Procedure(s) Performed: INSERTION, TUNNELED CENTRAL VENOUS DEVICE, WITH PORT (Right: Chest) BREAST LUMPECTOMY WITH RADIO FREQUENCY LOCALIZER (Right: Breast) BIOPSY, LYMPH NODE, SENTINEL (Right: Breast)  Patient Location: PACU  Anesthesia Type:General  Level of Consciousness: drowsy  Airway & Oxygen Therapy: Patient Spontanous Breathing and Patient connected to face mask oxygen  Post-op Assessment: Report given to RN and Post -op Vital signs reviewed and stable  Post vital signs: Reviewed and stable  Last Vitals:  Vitals Value Taken Time  BP 114/78 07/08/24 13:25  Temp 36.3 C 07/08/24 13:25  Pulse 106 07/08/24 13:27  Resp 17 07/08/24 13:27  SpO2 98 % 07/08/24 13:27  Vitals shown include unfiled device data.  Last Pain:  Vitals:   07/08/24 0951  TempSrc: Temporal  PainSc: 0-No pain         Complications: No notable events documented.

## 2024-07-08 NOTE — Interval H&P Note (Signed)
 No additional questions. Will proceed with both cases as previously discussed.

## 2024-07-08 NOTE — Anesthesia Postprocedure Evaluation (Signed)
 Anesthesia Post Note  Patient: Monica Gonzalez  Procedure(s) Performed: INSERTION, TUNNELED CENTRAL VENOUS DEVICE, WITH PORT (Right: Chest) BREAST LUMPECTOMY WITH RADIO FREQUENCY LOCALIZER (Right: Breast) BIOPSY, LYMPH NODE, SENTINEL (Right: Breast)  Patient location during evaluation: PACU Anesthesia Type: General Level of consciousness: awake Pain management: pain level controlled Vital Signs Assessment: post-procedure vital signs reviewed and stable Respiratory status: spontaneous breathing Cardiovascular status: stable Anesthetic complications: no   No notable events documented.   Last Vitals:  Vitals:   07/08/24 1400 07/08/24 1419  BP: 97/62 98/73  Pulse: 92 88  Resp: 11 15  Temp: (!) 36.3 C   SpO2: 93% 97%    Last Pain:  Vitals:   07/08/24 1419  TempSrc: Temporal  PainSc: 8                  VAN STAVEREN,Henreitta Spittler

## 2024-07-09 ENCOUNTER — Encounter: Payer: Self-pay | Admitting: Surgery

## 2024-07-11 ENCOUNTER — Other Ambulatory Visit: Payer: Self-pay | Admitting: Pathology

## 2024-07-11 LAB — SURGICAL PATHOLOGY

## 2024-07-13 ENCOUNTER — Other Ambulatory Visit: Payer: Self-pay

## 2024-07-17 ENCOUNTER — Encounter: Payer: Self-pay | Admitting: Oncology

## 2024-07-18 ENCOUNTER — Other Ambulatory Visit: Payer: Self-pay

## 2024-07-19 ENCOUNTER — Inpatient Hospital Stay

## 2024-07-19 DIAGNOSIS — T451X5A Adverse effect of antineoplastic and immunosuppressive drugs, initial encounter: Secondary | ICD-10-CM | POA: Insufficient documentation

## 2024-07-19 DIAGNOSIS — M797 Fibromyalgia: Secondary | ICD-10-CM | POA: Insufficient documentation

## 2024-07-19 DIAGNOSIS — C775 Secondary and unspecified malignant neoplasm of intrapelvic lymph nodes: Secondary | ICD-10-CM | POA: Insufficient documentation

## 2024-07-19 DIAGNOSIS — Z9221 Personal history of antineoplastic chemotherapy: Secondary | ICD-10-CM | POA: Insufficient documentation

## 2024-07-19 DIAGNOSIS — E876 Hypokalemia: Secondary | ICD-10-CM | POA: Insufficient documentation

## 2024-07-19 DIAGNOSIS — K5900 Constipation, unspecified: Secondary | ICD-10-CM | POA: Insufficient documentation

## 2024-07-19 DIAGNOSIS — Z1722 Progesterone receptor negative status: Secondary | ICD-10-CM | POA: Insufficient documentation

## 2024-07-19 DIAGNOSIS — Z171 Estrogen receptor negative status [ER-]: Secondary | ICD-10-CM | POA: Insufficient documentation

## 2024-07-19 DIAGNOSIS — Z86711 Personal history of pulmonary embolism: Secondary | ICD-10-CM | POA: Insufficient documentation

## 2024-07-19 DIAGNOSIS — C50911 Malignant neoplasm of unspecified site of right female breast: Secondary | ICD-10-CM | POA: Insufficient documentation

## 2024-07-19 DIAGNOSIS — R7401 Elevation of levels of liver transaminase levels: Secondary | ICD-10-CM | POA: Insufficient documentation

## 2024-07-19 DIAGNOSIS — Z923 Personal history of irradiation: Secondary | ICD-10-CM | POA: Insufficient documentation

## 2024-07-19 DIAGNOSIS — Z85048 Personal history of other malignant neoplasm of rectum, rectosigmoid junction, and anus: Secondary | ICD-10-CM | POA: Insufficient documentation

## 2024-07-19 DIAGNOSIS — C210 Malignant neoplasm of anus, unspecified: Secondary | ICD-10-CM | POA: Insufficient documentation

## 2024-07-19 DIAGNOSIS — Z1732 Human epidermal growth factor receptor 2 negative status: Secondary | ICD-10-CM | POA: Insufficient documentation

## 2024-07-19 DIAGNOSIS — K760 Fatty (change of) liver, not elsewhere classified: Secondary | ICD-10-CM | POA: Insufficient documentation

## 2024-07-19 DIAGNOSIS — K1230 Oral mucositis (ulcerative), unspecified: Secondary | ICD-10-CM | POA: Insufficient documentation

## 2024-07-19 DIAGNOSIS — R011 Cardiac murmur, unspecified: Secondary | ICD-10-CM | POA: Insufficient documentation

## 2024-07-19 DIAGNOSIS — M129 Arthropathy, unspecified: Secondary | ICD-10-CM | POA: Insufficient documentation

## 2024-07-19 DIAGNOSIS — D1803 Hemangioma of intra-abdominal structures: Secondary | ICD-10-CM | POA: Insufficient documentation

## 2024-07-19 DIAGNOSIS — Z79899 Other long term (current) drug therapy: Secondary | ICD-10-CM | POA: Insufficient documentation

## 2024-07-19 DIAGNOSIS — J45909 Unspecified asthma, uncomplicated: Secondary | ICD-10-CM | POA: Insufficient documentation

## 2024-07-19 DIAGNOSIS — Z86718 Personal history of other venous thrombosis and embolism: Secondary | ICD-10-CM | POA: Insufficient documentation

## 2024-07-19 SURGERY — Surgical Case
Anesthesia: *Unknown

## 2024-07-19 NOTE — Progress Notes (Signed)
 [facility] CSW Progress Note  Clinical Child psychotherapist contacted patient by phone per her request to follow-up on need for community resources.    Interventions: Patient stated she had questions regarding breast cancer grant applications that were mailed to her.  She confirmed she has been communicating with the [facility] regarding disability.  CSW offered to assist patient with grant applications and she agreed.      Follow Up Plan:  CSW will see patient on 9/9 at 11am.    Macario CHRISTELLA Au, LCSW Clinical Social Worker [facility]

## 2024-07-20 ENCOUNTER — Encounter: Payer: Self-pay | Admitting: Pulmonary Disease

## 2024-07-22 ENCOUNTER — Encounter: Payer: Self-pay | Admitting: Oncology

## 2024-07-22 ENCOUNTER — Encounter: Payer: Self-pay | Admitting: Pulmonary Disease

## 2024-07-22 ENCOUNTER — Other Ambulatory Visit

## 2024-07-22 DIAGNOSIS — R051 Acute cough: Secondary | ICD-10-CM

## 2024-07-22 DIAGNOSIS — J209 Acute bronchitis, unspecified: Secondary | ICD-10-CM

## 2024-07-22 MED ORDER — PROMETHAZINE-DM 6.25-15 MG/5ML PO SYRP: 5.0000 mL | ORAL_SOLUTION | Freq: Four times a day (QID) | ORAL | 0 refills | Status: DC | PRN

## 2024-07-22 MED ORDER — METHYLPREDNISOLONE 4 MG PO TBPK: ORAL_TABLET | ORAL | 0 refills | Status: DC

## 2024-07-22 NOTE — Progress Notes (Signed)
 Tumor Board Documentation  Monica Gonzalez was presented by Dr. Babara at our Tumor Board on 07/22/2024, which included representatives from medical oncology, radiation oncology, surgical, radiology, pathology, navigation, genetics, research.  Monica Gonzalez currently presents as a current patient with history of the following treatments: surgical intervention(s).  Additionally, we reviewed previous medical and familial history, history of present illness, and recent lab results along with all available histopathologic and imaging studies. The tumor board considered available treatment options and made the following recommendations: Surgery, Chemotherapy, Adjuvant radiation    The following procedures/referrals were also placed: No orders of the defined types were placed in this encounter.   Clinical Trial Status: not discussed   Staging used: AJCC Staging: T: T1c N: N61mi (SN)        National site-specific guidelines   were discussed with respect to the case.  Tumor board is a meeting of clinicians from various specialty areas who evaluate and discuss patients for whom a multidisciplinary approach is being considered. Final determinations in the plan of care are those of the provider(s). The responsibility for follow up of recommendations given during tumor board is that of the provider.   Today's extended care, comprehensive team conference, Monica Gonzalez was not present for the discussion and was not examined.   Multidisciplinary Tumor Board is a multidisciplinary case peer review process.  Decisions discussed in the Multidisciplinary Tumor Board reflect the opinions of the specialists present at the conference without having examined the patient.  Ultimately, treatment and diagnostic decisions rest with the primary provider(s) and the patient.

## 2024-07-22 NOTE — Telephone Encounter (Signed)
 Prescriptions for Medrol  Dosepak and Phenergan  DM were sent to the patient's pharmacy.

## 2024-07-22 NOTE — Telephone Encounter (Signed)
 We can send her a Medrol  Dosepak if she is okay with that.  She has been able to tolerate that previously according to her chart.  I can also send her in a cough syrup.

## 2024-07-23 ENCOUNTER — Inpatient Hospital Stay

## 2024-07-23 ENCOUNTER — Other Ambulatory Visit: Payer: Self-pay | Admitting: Oncology

## 2024-07-23 ENCOUNTER — Inpatient Hospital Stay (HOSPITAL_BASED_OUTPATIENT_CLINIC_OR_DEPARTMENT_OTHER): Admitting: Nurse Practitioner

## 2024-07-23 ENCOUNTER — Ambulatory Visit
Admission: RE | Admit: 2024-07-23 | Discharge: 2024-07-23 | Disposition: A | Discharge status: Home / Self Care | Source: Ambulatory Visit | Attending: Nurse Practitioner

## 2024-07-23 ENCOUNTER — Other Ambulatory Visit: Payer: Self-pay

## 2024-07-23 VITALS — BP 112/76 | HR 94 | Temp 98.7°F | Resp 18 | Ht 59.0 in | Wt 191.2 lb

## 2024-07-23 DIAGNOSIS — C50411 Malignant neoplasm of upper-outer quadrant of right female breast: Secondary | ICD-10-CM | POA: Insufficient documentation

## 2024-07-23 DIAGNOSIS — Z79899 Other long term (current) drug therapy: Secondary | ICD-10-CM | POA: Diagnosis not present

## 2024-07-23 DIAGNOSIS — M329 Systemic lupus erythematosus, unspecified: Secondary | ICD-10-CM | POA: Diagnosis not present

## 2024-07-23 DIAGNOSIS — Z791 Long term (current) use of non-steroidal anti-inflammatories (NSAID): Secondary | ICD-10-CM | POA: Diagnosis not present

## 2024-07-23 DIAGNOSIS — I82409 Acute embolism and thrombosis of unspecified deep veins of unspecified lower extremity: Secondary | ICD-10-CM | POA: Insufficient documentation

## 2024-07-23 DIAGNOSIS — C210 Malignant neoplasm of anus, unspecified: Secondary | ICD-10-CM | POA: Diagnosis present

## 2024-07-23 DIAGNOSIS — Z452 Encounter for adjustment and management of vascular access device: Secondary | ICD-10-CM | POA: Insufficient documentation

## 2024-07-23 DIAGNOSIS — Z9221 Personal history of antineoplastic chemotherapy: Secondary | ICD-10-CM | POA: Diagnosis not present

## 2024-07-23 DIAGNOSIS — Z923 Personal history of irradiation: Secondary | ICD-10-CM | POA: Diagnosis not present

## 2024-07-23 DIAGNOSIS — I82621 Acute embolism and thrombosis of deep veins of right upper extremity: Secondary | ICD-10-CM | POA: Diagnosis not present

## 2024-07-23 DIAGNOSIS — R011 Cardiac murmur, unspecified: Secondary | ICD-10-CM | POA: Diagnosis not present

## 2024-07-23 DIAGNOSIS — R590 Localized enlarged lymph nodes: Secondary | ICD-10-CM | POA: Insufficient documentation

## 2024-07-23 DIAGNOSIS — K219 Gastro-esophageal reflux disease without esophagitis: Secondary | ICD-10-CM | POA: Diagnosis not present

## 2024-07-23 DIAGNOSIS — M797 Fibromyalgia: Secondary | ICD-10-CM | POA: Diagnosis not present

## 2024-07-23 MED ORDER — ENOXAPARIN SODIUM 40 MG/0.4ML IJ SOSY: 40.0000 mg | PREFILLED_SYRINGE | Freq: Two times a day (BID) | INTRAMUSCULAR | 0 refills | Status: DC

## 2024-07-23 NOTE — Progress Notes (Signed)
 Symptom Management Clinic  [facility] at [facility] A Department of the [facility]. [facility] [address] [place], [place] [address] [phone] (phone) [phone] (fax)  Patient Care Team: Epifanio Alm SQUIBB, MD as PCP - General (Infectious Diseases) Maurie Rayfield BIRCH, RN as Oncology Nurse Navigator Babara Call, MD as Consulting Physician (Oncology) Lenn Aran, MD as Consulting Physician (Radiation Oncology) Georgina Shasta POUR, RN as Oncology Nurse Navigator   Name of the patient: Monica Gonzalez  [id number]  [birth year]   Date of visit: 07/23/24  Diagnosis-anal and breast cancer  Chief complaint/ Reason for visit-Port-A-Cath pain  Heme/Onc history:  Oncology History  Anal squamous cell carcinoma (HCC)  05/29/2024 Initial Diagnosis   Anal squamous cell carcinoma   Patient has noticed a hard mass at the anal area which she initially thought was a hemorrhoid.  The mass has been present for more than a month and appears to be enlarging. Patient was referred to establish care with surgeon Dr. Tye  05/16/2024, incisional biopsy of anal mass showed 1. Anus, biopsy, mass :       - INVASIVE MODERATELY DIFFERENTIATED SQUAMOUS CELL CARCINOMA  06/19/2024, left inguinal adenopathy biopsy showed Metastatic moderately differentiated squamous cell carcinoma.  '    05/29/2024 Cancer Staging   Staging form: Anus, AJCC V9 - Clinical stage from 05/29/2024: Stage IIIA (cT3, cN1a, cM0) - Signed by Babara Call, MD on 06/26/2024 Stage prefix: Initial diagnosis   05/30/2024 -  Chemotherapy   Patient is on Treatment Plan : ANUS Mitomycin D1,28 + 5FU D1-4, 28-31 q32d     06/05/2024 Imaging   CT chest abdomen pelvis with contrast  Slight thickening in the area of the anal canal. Please correlate for history of neoplasm. There is a pathologic enlarged left inguinal node. There is a borderline right-sided node.   No additional areas of abnormal nodal enlargement  or other aggressive appearing mass lesion at this time.   Fatty liver infiltration with known segment 7 hepatic hemangioma.   Of note there is a right-sided upper breast mass which was not clearly seen on prior CT scan. Please correlate for any prior study or if needed diagnostic mammographic evaluation and possible ultrasound when appropriate.     06/12/2024 Imaging   CT chest abdomen pelvis with contrast showed Slight thickening in the area of the anal canal. Please correlate for history of neoplasm. There is a pathologic enlarged left inguinal node. There is a borderline right-sided node.   No additional areas of abnormal nodal enlargement or other aggressive appearing mass lesion at this time.   Fatty liver infiltration with known segment 7 hepatic hemangioma.   Of note there is a right-sided upper breast mass which was not clearly seen on prior CT scan. Please correlate for any prior study or if needed diagnostic mammographic evaluation and possible ultrasound when appropriate.   Invasive carcinoma of breast (HCC)  06/12/2024 Imaging   CT chest abdomen pelvis with contrast showed Slight thickening in the area of the anal canal. Please correlate for history of neoplasm. There is a pathologic enlarged left inguinal node. There is a borderline right-sided node.   No additional areas of abnormal nodal enlargement or other aggressive appearing mass lesion at this time.   Fatty liver infiltration with known segment 7 hepatic hemangioma.   Of note there is a right-sided upper breast mass which was not clearly seen on prior CT scan. Please correlate for any prior study or if needed  diagnostic mammographic evaluation and possible ultrasound when appropriate.   06/17/2024 Mammogram   1. There is a suspicious 17 mm mass at the site of CT and mammographic concern in the RIGHT breast. It demonstrates associated pleomorphic calcifications. Recommend ultrasound-guided biopsy  for definitive characterization. 2. There is incidental sonographic note of a 7 mm mass in the RIGHT breast at 12 o'clock and a 6 mm non mass area at 9 o'clock. Recommend ultrasound-guided biopsy of these 2 areas for definitive characterization given suspicious appearance of the dominant mass. 3. No suspicious RIGHT axillary adenopathy. 4. No mammographic evidence of malignancy in the LEFT breast.   06/18/2024 Cancer Staging   Staging form: Breast, AJCC 8th Edition - Clinical stage from 06/18/2024: Stage IB (cT1b, cN0, cM0, G3, ER-, PR-, HER2-) - Signed by Babara Call, MD on 06/26/2024 Stage prefix: Initial diagnosis Histologic grading system: 3 grade system   06/18/2024 Initial Diagnosis   Invasive carcinoma of breast [facility])  Patient with right breast biopsy.  Pathology showed 1. Breast, right, needle core biopsy, 12:00 12cmfn (heart clip) :      INVASIVE DUCTAL CARCINOMA      TUBULE FORMATION: SCORE 3      NUCLEAR PLEOMORPHISM: SCORE 3      MITOTIC COUNT: SCORE 2      TOTAL SCORE: 8      OVERALL GRADE: 3      LYMPHOVASCULAR INVASION: NOT IDENTIFIED      CANCER LENGTH: 1.0 CM      CALCIFICATIONS: NOT IDENTIFIED      OTHER FINDINGS: NONE      ER negative, PR negative, HER2 negative (IHC 0) Ki-67 95%.       2. Breast, right, needle core biopsy, 12:00 8cmfn (coil clip) :      INVASIVE DUCTAL CARCINOMA      DUCTAL CARCINOMA IN SITU, SOLID, HIGH NUCLEAR GRADE WITH NECROSIS      TUBULE FORMATION: SCORE 3      NUCLEAR PLEOMORPHISM: SCORE 3      MITOTIC COUNT: SCORE 2      TOTAL SCORE: 8      OVERALL GRADE: 3      LYMPHOVASCULAR INVASION: NOT IDENTIFIED      CANCER LENGTH: 0.3 CM      CALCIFICATIONS: NOT IDENTIFIED      OTHER FINDINGS: NONE            3. Breast, right, needle core biopsy, 9:00 12cmfn (venus) :      BENIGN BREAST TISSUE WITH DENSE STROMAL FIBROSIS.      NEGATIVE FOR ATYPIA OR MALIGNANCY.    07/22/2024 Cancer Staging   Staging form: Breast, AJCC 8th Edition -  Pathologic stage from 07/22/2024: Stage IB (pT1c, pN16mi(sn), cM0, G3, ER-, PR-, HER2-) - Signed by Babara Call, MD on 07/22/2024 Stage prefix: Initial diagnosis Method of lymph node assessment: Sentinel lymph node biopsy Multigene prognostic tests performed: None Histologic grading system: 3 grade system     Interval history-patient is a 49 year old female undergoing treatment for breast and anal cancer who presents to symptom management clinic for complaints of right neck pain.  She had a Port-A-Cath placed with general surgery on 07/08/2024.  She noticed a few days later that it hurt to turn her neck and was generally uncomfortable.  She feels that the Port-A-Cath was more prominently visible.  No overlying erythema.  No wound drainage.  No headache or shortness of breath.  No chest pain.  No history of DVT.   Review  of systems- Review of Systems  Constitutional:  Negative for chills, fever and malaise/fatigue.  HENT:  Negative for congestion and sore throat.   Respiratory:  Negative for cough, hemoptysis and shortness of breath.   Cardiovascular:  Negative for chest pain and leg swelling.  Neurological:  Negative for dizziness and headaches.     Allergies  Allergen Reactions   Prednisone Shortness Of Breath and Swelling    Patient has taken methylprednisolone  (Depo-Medrol ) and dexamethasone  (Decadron ) without any documented side effects or adverse reactions. She describes that she used to take prednisone due to her bronchial asthma and respiratory problems, but later on she received some prednisone which into locations caused her to have significant swelling and shortness of breath. For that reason, she was told to avoid the use of prednisone.    Past Medical History:  Diagnosis Date   Anemia    Anginal pain (HCC)    Anxiety    Arthritis    Asthma    Cancer (HCC)    Depression    Dyspnea    Fibromyalgia    GERD (gastroesophageal reflux disease)    Headache    Heart murmur    History  of hiatal hernia    History of kidney stones    Lupus    Malignant neoplasm of upper-outer quadrant of right female breast, unspecified estrogen receptor status (HCC) 2025   Pneumonia    PTSD (post-traumatic stress disorder)     Past Surgical History:  Procedure Laterality Date   BREAST BIOPSY Right 06/18/2024   US  RT BREAST BX W LOC DEV 1ST LESION IMG BX SPEC US  GUIDE 06/18/2024 [facility]   BREAST BIOPSY Right 06/18/2024   US  RT BREAST BX W LOC DEV EA ADD LESION IMG BX SPEC US  GUIDE 06/18/2024 [facility]   BREAST BIOPSY Right 06/18/2024   US  RT BREAST BX W LOC DEV EA ADD LESION IMG BX SPEC US  GUIDE 06/18/2024 [facility]   BREAST BIOPSY Right 07/05/2024   US  RT BREAST SAVI/RF TAG 1ST LESION US  GUIDE 07/05/2024 [facility]   BREAST BIOPSY Right 07/05/2024   US  RT BREAST SAVI/RF TAG EA ADD'L LESION US  GUIDE 07/05/2024 [facility]   BREAST LUMPECTOMY WITH RADIO FREQUENCY LOCALIZER Right 07/08/2024   Procedure: BREAST LUMPECTOMY WITH RADIO FREQUENCY LOCALIZER;  Surgeon: Tye Millet, DO;  Location: [facility];  Service: General;  Laterality: Right;   COLONOSCOPY WITH PROPOFOL  N/A 02/17/2023   Procedure: COLONOSCOPY WITH PROPOFOL ;  Surgeon: Maryruth Ole DASEN, MD;  Location: [facility];  Service: Endoscopy;  Laterality: N/A;   ESOPHAGOGASTRODUODENOSCOPY (EGD) WITH PROPOFOL  N/A 02/17/2023   Procedure: ESOPHAGOGASTRODUODENOSCOPY (EGD) WITH PROPOFOL ;  Surgeon: Maryruth Ole DASEN, MD;  Location: [facility];  Service: Endoscopy;  Laterality: N/A;   EXTRACORPOREAL SHOCK WAVE LITHOTRIPSY     PORTACATH PLACEMENT Right 07/08/2024   Procedure: INSERTION, TUNNELED CENTRAL VENOUS DEVICE, WITH PORT;  Surgeon: Tye Millet, DO;  Location: [facility];  Service: General;  Laterality: Right;   RECTAL EXAM UNDER ANESTHESIA N/A 05/16/2024   Procedure: EXAM UNDER ANESTHESIA, RECTUM, INCISIONAL BIOPSY OF ANAL MASS;  Surgeon: Tye Millet, DO;  Location: [facility];  Service: General;   Laterality: N/A;   SENTINEL NODE BIOPSY Right 07/08/2024   Procedure: BIOPSY, LYMPH NODE, SENTINEL;  Surgeon: Tye Millet, DO;  Location: [facility];  Service: General;  Laterality: Right;   TONSILLECTOMY     TUBAL LIGATION     VULVECTOMY N/A 07/29/2020   Procedure: WIDE EXCISION VULVECTOMY;  Surgeon: Mancil Barter, MD;  Location: [facility];  Service: Gynecology;  Laterality: N/A;    Social History   Socioeconomic History   Marital status: Divorced    Spouse name: Not on file   Number of children: 1   Years of education: Not on file   Highest education level: Not on file  Occupational History   Not on file  Tobacco Use   Smoking status: Never   Smokeless tobacco: Never  Vaping Use   Vaping status: Never Used  Substance and Sexual Activity   Alcohol use: Not Currently   Drug use: No   Sexual activity: Yes  Other Topics Concern   Not on file  Social History Narrative   Not on file   Social Drivers of Health   Financial Resource Strain: Medium Risk (05/15/2024)   Received from [facility]   Overall Financial Resource Strain (CARDIA)    Difficulty of Paying Living Expenses: Somewhat hard  Food Insecurity: No Food Insecurity (05/29/2024)   Hunger Vital Sign    Worried About Running Out of Food in the Last Year: Never true    Ran Out of Food in the Last Year: Never true  Transportation Needs: No Transportation Needs (05/29/2024)   PRAPARE - Administrator, Civil Service (Medical): No    Lack of Transportation (Non-Medical): No  Physical Activity: Not on file  Stress: Not on file  Social Connections: Not on file  Intimate Partner Violence: Not At Risk (05/29/2024)   Humiliation, Afraid, Rape, and Kick questionnaire    Fear of Current or Ex-Partner: No    Emotionally Abused: No    Physically Abused: No    Sexually Abused: No    Family History  Problem Relation Age of Onset   Diabetes Mother    Congestive Heart Failure Mother    Lupus  Maternal Grandmother      Current Outpatient Medications:    acetaminophen  (TYLENOL ) 325 MG tablet, Take 2 tablets (650 mg total) by mouth every 8 (eight) hours as needed for mild pain (pain score 1-3)., Disp: 40 tablet, Rfl: 0   albuterol  (PROVENTIL ) (2.5 MG/3ML) 0.083% nebulizer solution, Take 2.5 mg by nebulization every 6 (six) hours as needed for shortness of breath or wheezing., Disp: , Rfl:    albuterol  (VENTOLIN  HFA) 108 (90 Base) MCG/ACT inhaler, Inhale 2 puffs into the lungs every 4 (four) hours as needed for wheezing or shortness of breath., Disp: 8.5 g, Rfl: 2   benzonatate  (TESSALON ) 200 MG capsule, Take 1 capsule (200 mg total) by mouth 3 (three) times daily as needed for cough., Disp: 30 capsule, Rfl: 1   busPIRone (BUSPAR) 5 MG tablet, Take 5 mg by mouth 2 (two) times daily., Disp: , Rfl:    celecoxib (CELEBREX) 200 MG capsule, Take 200 mg by mouth 2 (two) times daily., Disp: , Rfl:    clonazePAM (KLONOPIN) 0.5 MG tablet, Take 0.5 mg by mouth at bedtime as needed (anxiety/sleep)., Disp: , Rfl:    DULoxetine (CYMBALTA) 30 MG capsule, Take 30 mg by mouth at bedtime., Disp: , Rfl:    ferrous sulfate 325 (65 FE) MG tablet, Take 325 mg by mouth daily. , Disp: , Rfl:    gabapentin (NEURONTIN) 300 MG capsule, Take 600 mg by mouth 2 (two) times daily., Disp: , Rfl:    ipratropium (ATROVENT  HFA) 17 MCG/ACT inhaler, Inhale 2 puffs into the lungs every 6 (six) hours as needed for wheezing (COUGH)., Disp: 12.9 g, Rfl: 1   nystatin  (MYCOSTATIN /NYSTOP ) powder, Apply 1  Application topically 2 (two) times daily., Disp: 20 g, Rfl: 0   pantoprazole (PROTONIX) 40 MG tablet, Take by mouth., Disp: , Rfl:    traZODone (DESYREL) 50 MG tablet, Take 50 mg by mouth at bedtime., Disp: , Rfl:    ascorbic acid (VITAMIN C) 500 MG tablet, Take 500 mg by mouth daily. (Patient not taking: Reported on 07/23/2024), Disp: , Rfl:    B Complex-C (SUPER B COMPLEX PO), Take by mouth daily. (Patient not taking: Reported on  07/23/2024), Disp: , Rfl:    BIOTIN PO, Take by mouth daily. (Patient not taking: Reported on 07/23/2024), Disp: , Rfl:    cholecalciferol (VITAMIN D3) 25 MCG (1000 UNIT) tablet, Take 1,000 Units by mouth daily. (Patient not taking: Reported on 07/23/2024), Disp: , Rfl:    lidocaine  (LIDODERM ) 5 %, 1 patch daily. (Patient not taking: Reported on 07/23/2024), Disp: , Rfl:    methylPREDNISolone  (MEDROL  DOSEPAK) 4 MG TBPK tablet, Take as directed in the package, this is a taper pack. (Patient not taking: Reported on 07/23/2024), Disp: 21 tablet, Rfl: 0   Multiple Vitamin (MULTIVITAMIN WITH MINERALS) TABS tablet, Take 1 tablet by mouth daily. (Patient not taking: Reported on 07/23/2024), Disp: , Rfl:    naproxen  (EC NAPROSYN ) 500 MG EC tablet, Take 1 tablet (500 mg total) by mouth 2 (two) times daily with a meal. (Patient not taking: Reported on 07/23/2024), Disp: 60 tablet, Rfl: 0   promethazine -dextromethorphan (PROMETHAZINE -DM) 6.25-15 MG/5ML syrup, Take 5 mLs by mouth 4 (four) times daily as needed for cough. (Patient not taking: Reported on 07/23/2024), Disp: 118 mL, Rfl: 0   traMADol  (ULTRAM ) 50 MG tablet, Take 1 tablet (50 mg total) by mouth every 8 (eight) hours as needed. (Patient not taking: Reported on 07/23/2024), Disp: 15 tablet, Rfl: 0   Turmeric (QC TUMERIC COMPLEX PO), Take by mouth daily. (Patient not taking: Reported on 07/23/2024), Disp: , Rfl:   Physical exam:  Vitals:   07/23/24 1145 07/23/24 1149  BP: 112/76   Pulse: 94   Resp: 18   Temp: 98.7 F (37.1 C)   TempSrc: Tympanic   SpO2: 100%   Weight:  191 lb 3.2 oz (86.7 kg)  Height:  4' 11 (1.499 m)   Physical Exam Constitutional:      Appearance: She is not ill-appearing.  HENT:     Head:     Comments: Right neck into chest mildly tender to palpation Pulmonary:     Effort: No respiratory distress.  Neurological:     Mental Status: She is alert.         Latest Ref Rng & Units 06/30/2024    7:36 PM  CMP  Glucose 70 - 99 mg/dL 98    BUN 6 - 20 mg/dL 6   Creatinine 9.55 - 8.99 mg/dL 9.09   Sodium 864 - 854 mmol/L 140   Potassium 3.5 - 5.1 mmol/L 3.5   Chloride 98 - 111 mmol/L 103   CO2 22 - 32 mmol/L 25   Calcium 8.9 - 10.3 mg/dL 9.3       Latest Ref Rng & Units 06/30/2024    7:36 PM  CBC  WBC 4.0 - 10.5 K/uL 10.0   Hemoglobin 12.0 - 15.0 g/dL 85.5   Hematocrit 63.9 - 46.0 % 44.0   Platelets 150 - 400 K/uL 398     No images are attached to the encounter.  US  Venous Img Upper Uni Right Result Date: 07/23/2024 CLINICAL DATA:  Right neck pain  at Port-A-Cath site EXAM: Right UPPER EXTREMITY VENOUS DOPPLER ULTRASOUND TECHNIQUE: Gray-scale sonography with graded compression, as well as color Doppler and duplex ultrasound were performed to evaluate the upper extremity deep venous system from the level of the subclavian vein and including the jugular, axillary, basilic, radial, ulnar and upper cephalic vein. Spectral Doppler was utilized to evaluate flow at rest and with distal augmentation maneuvers. COMPARISON:  None Available. FINDINGS: Contralateral Subclavian Vein: Respiratory phasicity is normal and symmetric with the symptomatic side. No evidence of thrombus. Normal compressibility. Internal Jugular Vein: Positive for acute occlusive thrombus. Incompletely compressible Subclavian Vein: Positive for acute occlusive thrombus. Axillary Vein: No evidence of thrombus. Normal compressibility, respiratory phasicity and response to augmentation. Cephalic Vein: No evidence of thrombus. Normal compressibility, respiratory phasicity and response to augmentation. Basilic Vein: No evidence of thrombus. Normal compressibility, respiratory phasicity and response to augmentation. Brachial Veins: No evidence of thrombus. Normal compressibility, respiratory phasicity and response to augmentation. Radial Veins: No evidence of thrombus. Normal compressibility, respiratory phasicity and response to augmentation. Ulnar Veins: No evidence of  thrombus. Normal compressibility, respiratory phasicity and response to augmentation. IMPRESSION: Positive for acute occlusive DVT involving the right internal jugular and subclavian veins. Critical Value/emergent results were called by telephone at the time of interpretation on 07/23/2024 at 3:38 pm to provider TINNIE DAWN , who verbally acknowledged these results. Electronically Signed   By: Luke Bun M.D.   On: 07/23/2024 15:39    Assessment and plan- Patient is a 48 y.o. female diagnosed with breast and anal cancer who presents to symptom management for complaints of:   Port a cath pain- placed by Dr Tye on 07/08/24. Plan for ultrasound to evaluate for port associated DVT. High risk given active malignancy. US  reviewed and positive for occlusive DVT of right internal jugular and subclavian veins. Recommend starting lovenox  40 mg every 12 hours for treatment. Once she completes surgery we can rotate her to a oral anticoagulant. She will need to hold evening dose on 9/10 and morning dose on 9/11 then can resume lovenox  after surgery. She will plan for at least 3 months of anticoagulation. Dr Babara and Dr Tye updated.    Visit Diagnosis 1. Acute deep vein thrombosis (DVT) of other vein of right upper extremity (HCC)   2. Encounter for care related to Port-a-Cath    Patient expressed understanding and was in agreement with this plan. She also understands that She can call clinic at any time with any questions, concerns, or complaints.   Thank you for allowing me to participate in the care of this very pleasant patient.   TINNIE DAWN, DNP, AGNP-C, AOCNP [facility] [phone]

## 2024-07-23 NOTE — Progress Notes (Signed)
 [facility] CSW Progress Note  Visual merchandiser met with patient to follow-up on need for community resources.    Interventions: Reviewed breast cancer grant applications.  Patient started four applications and will provide supporting documentation so that CSW can complete.  CSW to then mail applications after obtaining the letter from her MD confirming her diagnosis.  Patient reported soreness  in her neck.  CSW consulted RN who was able to assess need.      Follow Up Plan:  CSW will follow-up with patient by phone     Macario CHRISTELLA Au, LCSW Clinical Social Worker [facility]    Patient is participating in a Managed Medicaid Plan:  Yes

## 2024-07-24 ENCOUNTER — Telehealth: Payer: Self-pay

## 2024-07-24 ENCOUNTER — Ambulatory Visit: Payer: Self-pay | Admitting: Surgery

## 2024-07-24 ENCOUNTER — Encounter: Payer: Self-pay | Admitting: *Deleted

## 2024-07-24 ENCOUNTER — Ambulatory Visit
Admission: RE | Admit: 2024-07-24 | Discharge: 2024-07-24 | Disposition: A | Discharge status: Home / Self Care | Source: Ambulatory Visit | Attending: Radiation Oncology | Admitting: Radiation Oncology

## 2024-07-24 ENCOUNTER — Encounter
Admission: RE | Admit: 2024-07-24 | Discharge: 2024-07-24 | Disposition: A | Discharge status: Home / Self Care | Source: Ambulatory Visit | Attending: Surgery | Admitting: Surgery

## 2024-07-24 ENCOUNTER — Other Ambulatory Visit: Payer: Self-pay

## 2024-07-24 VITALS — Ht 59.0 in | Wt 191.0 lb

## 2024-07-24 DIAGNOSIS — Z923 Personal history of irradiation: Secondary | ICD-10-CM | POA: Insufficient documentation

## 2024-07-24 DIAGNOSIS — M329 Systemic lupus erythematosus, unspecified: Secondary | ICD-10-CM | POA: Insufficient documentation

## 2024-07-24 DIAGNOSIS — M797 Fibromyalgia: Secondary | ICD-10-CM | POA: Insufficient documentation

## 2024-07-24 DIAGNOSIS — C50411 Malignant neoplasm of upper-outer quadrant of right female breast: Secondary | ICD-10-CM | POA: Insufficient documentation

## 2024-07-24 DIAGNOSIS — Z9221 Personal history of antineoplastic chemotherapy: Secondary | ICD-10-CM | POA: Insufficient documentation

## 2024-07-24 DIAGNOSIS — Z79899 Other long term (current) drug therapy: Secondary | ICD-10-CM | POA: Insufficient documentation

## 2024-07-24 DIAGNOSIS — Z791 Long term (current) use of non-steroidal anti-inflammatories (NSAID): Secondary | ICD-10-CM | POA: Insufficient documentation

## 2024-07-24 DIAGNOSIS — K219 Gastro-esophageal reflux disease without esophagitis: Secondary | ICD-10-CM | POA: Insufficient documentation

## 2024-07-24 DIAGNOSIS — R011 Cardiac murmur, unspecified: Secondary | ICD-10-CM | POA: Insufficient documentation

## 2024-07-24 DIAGNOSIS — R590 Localized enlarged lymph nodes: Secondary | ICD-10-CM | POA: Insufficient documentation

## 2024-07-24 DIAGNOSIS — Z01812 Encounter for preprocedural laboratory examination: Secondary | ICD-10-CM

## 2024-07-24 DIAGNOSIS — C210 Malignant neoplasm of anus, unspecified: Secondary | ICD-10-CM | POA: Insufficient documentation

## 2024-07-24 HISTORY — DX: Acute embolism and thrombosis of unspecified deep veins of unspecified lower extremity: I82.409

## 2024-07-24 NOTE — Progress Notes (Signed)
 Ms. Locken had a couple questions regarding her upcoming treatments.  Answered all questions to her satisfaction.

## 2024-07-24 NOTE — H&P (View-Only) (Signed)
 Subjective:    CC: Malignant neoplasm of upper-outer quadrant of right female breast, unspecified estrogen receptor status (CMS/HHS-HCC) [C50.411] HPI:  referred by [name], MD for evaluation of above. Change was noted on CT for anal CA mets workup.  Subsequent mammo bx results noted below.  Pt has not noticed the lump before.  Never had mammo recommended to her either.  S/p lumpectomy but now with positive margins   History of Present Illness   G5P3.  Menarch around 10.  First born at 65, did not breastfeed.  LMP. No FH of breast related issues     Past Medical History:  has a past medical history of Acute stomach ulcer, Anemia, Anxiety, Asthma (HHS-HCC), Bronchitis, chronic (CMS/HHS-HCC), GERD (gastroesophageal reflux disease), Heart murmur, Hiatal hernia, History of cancer, History of pneumonia, Kidney stones, Lupus, and Vulvar intraepithelial neoplasia (VIN) grade 3.   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   Family History: family history includes Asthma in her mother; Atrial fibrillation (Abnormal heart rhythm sometimes requiring treatment with blood thinners) in her maternal grandmother; Autoimmune disease in her maternal grandmother; COPD in her mother; Cancer in her paternal grandfather; Clotting disorder in her maternal grandmother; Coronary Artery Disease (Blocked arteries around heart) in her father and mother; Diabetes type II in her mother; Heart failure in her maternal grandmother; High blood pressure (Hypertension) in her maternal grandmother; Kidney failure in her mother; Lupus in her maternal grandmother; Myocardial Infarction (Heart attack) in her mother; No Known Problems in her half-brother and half-sister; Prostate cancer in her maternal  grandfather.   Social History:  reports that she quit smoking about 22 years ago. Her smoking use included cigarettes. She has never used smokeless tobacco. She reports current alcohol use. She reports that she does not use drugs.   Current Medications: has a current medication list which includes the following prescription(s): buspirone, calcium carbonate, celecoxib, clonazepam, cyclobenzaprine , duloxetine, hydrocodone -acetaminophen , ipratropium-albuterol , lidocaine , linaclotide, mag hydrox/aluminum hyd/simeth, polyethylene glycol, promethazine -dextromethorphan, sumatriptan, tranexamic acid, trazodone, albuterol , albuterol  mdi (proventil , ventolin , proair ) hfa, and gabapentin.   Allergies:           Allergies as of 06/24/2024 - Reviewed 06/24/2024  Allergen Reaction Noted   Prednisone Swelling and Anaphylaxis 05/03/2015      ROS:  A 15 point review of systems was performed and was negative except as noted in HPI   Objective:    Objective BP 109/75   Pulse 62   Ht 149.9 cm (4' 11)   Wt 88 kg (194 lb)   BMI 39.18 kg/m    Constitutional :  No distress, cooperative, alert  Lymphatics/Throat:  Supple with no lymphadenopathy  Respiratory:  Clear to auscultation bilaterally  Cardiovascular:  Regular rate and rhythm  Gastrointestinal: Soft, non-tender, non-distended, no organomegaly.  Musculoskeletal: Steady gait and movement  Skin: Cool and moist  Psychiatric: Normal affect, non-agitated, not confused  Breast: Normal appearance and no palpable abnormality in left breast and axilla.  Post bx changes noted in right with bruising, subtly palpable lump in area of concern. No right axillary lymphadenopathy. Chaperone present for exam.      LABS:  SURGICAL PATHOLOGY SURGICAL PATHOLOGY [facility] [address] [place], [place] [address] Telephone [phone] or [phone] Fax [phone]  REPORT OF SURGICAL  PATHOLOGY   Accession #:  [id number] Patient Name: ZARA, WENDT Visit # : [id number]  MRN: [id number] Physician: Vanice Ozell Revel DOB/Age [birth year] (Age: 67) Gender: F Collected Date: 06/19/2024 Received Date: 06/19/2024  FINAL DIAGNOSIS       1. Lymph node, biopsy, left inguinal adenopathy :      -  METASTATIC MODERATELY DIFFERENTIATED SQUAMOUS CELL CARCINOMA.      NOTE: DR. SHARMA HAS PEER REVIEWED THE CASE AND AGREES WITH THE INTERPRETATION.       DATE SIGNED OUT: 06/20/2024 ELECTRONIC SIGNATURE : Legolvan Do, Mark, Pathologist, Electronic Signature  MICROSCOPIC DESCRIPTION  CASE COMMENTS STAINS USED IN DIAGNOSIS: H&E    CLINICAL HISTORY  SPECIMEN(S) OBTAINED 1. Lymph node, biopsy, Left Inguinal Adenopathy  SPECIMEN COMMENTS: SPECIMEN CLINICAL INFORMATION: 1. MET DZ.  Anal    Gross Description 1. Received in formalin are multiple fragments of pink-tan soft tissue aggregating to 1.9 x 0.1 x 0.1 cm submitted entirely in 1 block. mb 06-19-24        Report signed out from the following location(s) [place].  HOSP[facility] [address] [place], [place] [address] CLIA #: [id number]  [facility] [address] [place], [place] [address] CLIA #: [id number]    SURGICAL PATHOLOGY SURGICAL PATHOLOGY [facility] [address], [address] [place], [place] [address] Telephone [phone] or [phone] Fax [phone]  REPORT OF SURGICAL PATHOLOGY   Accession #: [id number] Patient Name: [name], [name] Visit # : [id number]  MRN: [id number] Physician: Correne Krabbe DOB/Age [birth year] (Age: 48) Gender: F Collected Date: 06/18/2024 Received Date: 06/18/2024  FINAL DIAGNOSIS       1. Breast, right, needle core biopsy, 12:00 12cmfn (heart clip) :       INVASIVE DUCTAL CARCINOMA      TUBULE FORMATION: SCORE 3      NUCLEAR PLEOMORPHISM: SCORE 3      MITOTIC COUNT: SCORE 2      TOTAL SCORE: 8      OVERALL GRADE: 3       LYMPHOVASCULAR INVASION: NOT IDENTIFIED      CANCER LENGTH: 1.0 CM      CALCIFICATIONS: NOT IDENTIFIED      OTHER FINDINGS: NONE      SEE NOTE       2. Breast, right, needle core biopsy, 12:00 8cmfn (coil clip) :      INVASIVE DUCTAL CARCINOMA      DUCTAL CARCINOMA IN SITU, SOLID, HIGH NUCLEAR GRADE WITH NECROSIS      TUBULE FORMATION: SCORE 3      NUCLEAR PLEOMORPHISM: SCORE 3      MITOTIC COUNT: SCORE 2      TOTAL SCORE: 8      OVERALL GRADE: 3      LYMPHOVASCULAR INVASION: NOT IDENTIFIED      CANCER LENGTH: 0.3 CM      CALCIFICATIONS: NOT IDENTIFIED      OTHER FINDINGS: NONE      SEE NOTE       3. Breast, right, needle core biopsy, 9:00 12cmfn (venus) :      BENIGN BREAST TISSUE WITH DENSE STROMAL FIBROSIS.      NEGATIVE FOR ATYPIA OR MALIGNANCY.       Diagnosis Note :      DR. Wolford REVIEWED THE CASE AND CONCURS WITH THE INTERPRETATION.  A BREAST      PROGNOSTIC PROFILE (ER AND PR) IS PENDING AND WILL BE REPORTED IN AN ADDENDUM.  Rock Hover WAS NOTIFIED ON 06/19/2024.      ELECTRONIC SIGNATURE : Pepper Dutton Md, Pathologist, Electronic Signature  MICROSCOPIC DESCRIPTION  CASE COMMENTS STAINS USED IN DIAGNOSIS: H&E-2 H&E-3 H&E-4 H&E *RECUT 1 SLIDE H&E-2 H&E-3 H&E-4 H&E H&E-2 H&E-3 H&E-4 H&E Stains used in diagnosis 1 Her2 by IHC, 1 ER-ACIS, 1 KI-67-ACIS, 1 PR-ACIS IHC scores are reported using ASCO/CAP scoring criteria.  An IHC Score of 0 or 1+  is NEGATIVE for HER2, 3+ is POSITIVE for HER2, and 2+ is EQUIVOCAL. Equivocal results are reflexed to either FISH or IHC testing. Specimens are fixed in 10% Neutral Buffered Formalin for at least 6 hours and up to 72 hours. These tests have not be validated on decalcified tissue.  Results should be interpreted with caution given the possibility of false negative results on decalcified specimens. Antibody Clone for HER2 is 4B5 (PATHWAY). Some of these immunohistochemical stains may have been developed and the  performance characteristics determined by [facility].  Some may not have been cleared or approved by the U.S. Food and Drug Administration.  The FDA has determined that such clearance or approval is not necessary.  This test is used for clinical purposes.  It should not be regarded as investigational or for research.  This laboratory is certified under the Clinical Laboratory Improvement Amendments of 1988 (CLIA-88) as qualified to perform high complexity clinical laboratory testing. Estrogen receptor (6F11), immunohistochemical stains are performed on formalin fixed, paraffin embedded tissue using a 3,3-diaminobenzidine (DAB) chromogen and Leica Bond Autostainer System.  The staining intensity of the nucleus is scored manually and is reported as the percentage of tumor cell nuclei demonstrating specific nuclear staining.Specimens are fixed in 10% Neutral Buffered Formalin for at least 6 hours and up to 72 hours.  These tests have not be validated on decalcified tissue.  Results should be interpreted with caution given the possibility of false negative results on decalcified specimens. Ki-67 (MM1), immunohistochemical stains are performed on formalin fixed, paraffin embedded tissue using a 3,3-diaminobenzidine (DAB) chromogen and Leica Bond Autostainer System.  The staining intensity of the nucleus is scored manually and is reported as the percentage of tumor cell nuclei demonstrating specific nuclear staining.Specimens are fixed in 10% Neutral Buffered Formalin for at least 6 hours and up to 72 hours. These tests have not be validated on decalcified tissue.  Results should be interpreted with caution given the possibility of false negative results on decalcified specimens. PR progesterone receptor (16), immunohistochemical stains are performed on formalin fixed, paraffin embedded tissue using a 3,3-diaminobenzidine (DAB) chromogen and Leica Bond Autostainer System.  The  staining intensity of the nucleus is scored manually and is reported as the percentage of tumor cell nuclei demonstrating specific nuclear staining.Specimens are fixed in 10% Neutral Buffered Formalin for at least 6 hours and up to 72 hours. These tests have not be validated on decalcified tissue.  Results should be interpreted with caution given the possibility of false negative results on decalcified specimens.  ADDENDUM 1A) Breast, right, needle core biopsy, 12:00 12cmfn (heart clip) PROGNOSTIC INDICATORS  Results: IMMUNOHISTOCHEMICAL AND MORPHOMETRIC ANALYSIS PERFORMED MANUALLY 240The tumor cells are NEGATIVE for Her2 (0). Estrogen Receptor:  0%, NEGATIVE Progesterone Receptor:  0%, NEGATIVE Proliferation Marker Ki67:  95% COMMENT:  The negative hormone receptor study(ies) in this case has no internal positive control.  REFERENCE RANGE ESTROGEN RECEPTOR NEGATIVE     0% POSITIVE       =>1% REFERENCE RANGE PROGESTERONE RECEPTOR NEGATIVE  0% POSITIVE        =>1% All controls stained appropriately Belvie Rolla Rush, Pathologist, Electronic Signature [phone] 07 2025)   CLINICAL HISTORY  SPECIMEN(S) OBTAINED 1. Breast, right, needle core biopsy, 12:00 12cmfn (heart Clip) 2. Breast, right, needle core biopsy, 12:00 8cmfn (coil Clip) 3. Breast, right, needle core biopsy, 9:00 12cmfn (venus)  SPECIMEN COMMENTS: 1. TIF: 14:18pm, CIT: less than 30 sec 2. TIF: 14:21pm, CIT: less than 30 sec 3. TIF: 14:37pm, CIT: Less than 30 sec SPECIMEN CLINICAL INFORMATION: 1. Suspicioius right breast mass with calcs (site 1) sonographically indentified areas in right breast (site2 and 3)    Gross Description 1. Received in formalin labeled with the patient's name right breast 12 o'clock, 12 cm from nipple, and consists of multiple core and core fragments of tan yellow fibroadipose tissue, ranging from 0.2 cm to 1.5 x 0.2 x 0.2 cm.The specimen is entirely submitted in one  cassette.      TIF 2:18 p.m. on 06/18/24. CIT less than 30 seconds 2. Received in formalin labeled with the patient's name and right breast 12 o'clock, 8 cm from nipple and consists of multiple core and core fragments of tan pink fibroadipose tissue, ranging from 0.1 cm to 1.2 x 0.2 x 0.2 cm.The specimen is entirely submitted in one cassette.      TIF 2:21 p.m. on 06/18/24.  CIT less than 30 seconds 3. Received in formalin labeled with the patient's name and right breast 9 o'clock, 12 cm from nipple and consists of multiple core and core fragment of tan yellow fibroadipose tissue, ranging from 0.1 cm to 1.3 x 0.2 x 0.2 cm.The specimen is entirely submitted in one cassette.      TIF 2:37 p.m. on 06/18/24.  CIT less than 30 seconds.  (KL:gt, 06/19/24)        Report signed out from the following location(s) [place].  HOSPITAL [address] [place], [place] [address] CLIA #: [id number]  [facility] [address] [place], [place] [address] CLIA #: [id number]      RADS: ADDENDUM REPORT: 06/19/2024 14:32   ADDENDUM: PATHOLOGY revealed: Site 1. 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   Pathology results are CONCORDANT with imaging findings, per Corean Salter M.D.   PATHOLOGY revealed: Site 2. Breast, right, needle core biopsy, 12:00; 8 cmfn (coil clip) : INVASIVE DUCTAL CARCINOMA - DUCTAL CARCINOMA IN SITU, SOLID, HIGH NUCLEAR GRADE WITH NECROSIS. OVERALL GRADE: 3 -   LYMPHOVASCULAR INVASION: NOT IDENTIFIED. CANCER LENGTH: 0.3 CM. CALCIFICATIONS: NOT IDENTIFIED   Pathology results are CONCORDANT with imaging findings, per Corean Salter M.D.   PATHOLOGY revealed: Site 3. Breast, right, needle core biopsy, 9:00; 12 cmfn (venus) : BENIGN BREAST TISSUE WITH DENSE STROMAL  FIBROSIS. NEGATIVE FOR ATYPIA OR MALIGNANCY.   Pathology results are CONCORDANT with imaging findings, per Corean Salter M.D.   Pathology results and recommendations below were discussed with patient by telephone on 06/19/2024 by Rock Hover RN. Patient reported biopsy site within normal limits with slight tenderness at the site. Post biopsy care instructions were reviewed, questions were answered and my direct phone number was provided to patient. Patient was instructed to call [facility] if any concerns or questions arise related to the biopsy.   RECOMMENDATIONS: 1. Surgical and oncological consultation. Request for surgical and oncological  consultation relayed to Shasta Ada RN at [facility] by Rock Hover RN on 06/19/2024.   2. Recommend pretreatment bilateral breast MRI with and without contrast to assess extent of breast disease, given the patient's breast density and age.   3. The 2 sites of biopsy-proven malignancy (COIL and heart clip) are approximately 4.7 cm apart.   Pathology results reported by Rock Hover RN on 06/19/2024.     Electronically Signed   By: Corean Salter M.D.   On: 06/19/2024 14:32   CLINICAL DATA:  Patient with recently diagnosed anal cancer. On staging examination, a mass was noted in the RIGHT upper breast.   EXAM: DIGITAL DIAGNOSTIC BILATERAL MAMMOGRAM WITH TOMOSYNTHESIS AND CAD; ULTRASOUND RIGHT BREAST LIMITED   TECHNIQUE: Bilateral digital diagnostic mammography and breast tomosynthesis was performed. The images were evaluated with computer-aided detection. ; Targeted ultrasound examination of the right breast was performed   COMPARISON:  None available.   ACR Breast Density Category c: The breasts are heterogeneously dense, which may obscure small masses.   FINDINGS: Diagnostic images of the RIGHT breast demonstrate an irregular mass with associated pleomorphic calcifications in the upper breast  at middle depth. Mass is estimated to span 17 mm. Associated calcifications are estimated to span approximately 17 mm.   A superficial oval mass noted in the RIGHT upper outer breast on true lateral slice 57 with possible internal fat density. No suspicious mass, distortion, or microcalcifications are identified to suggest presence of malignancy in the LEFT breast.   On physical exam, there is a hard mass in the upper breast.   Targeted ultrasound was performed of the upper breast. There is an irregular hypoechoic mass at 12 o'clock 12 cm from the nipple with indistinct margins. It demonstrates multiple associated echogenic foci and posterior acoustic shadowing. It spans approximately 15 x 11 x 12 mm and corresponds to the site of mammographic concern.   Targeted ultrasound was performed of the RIGHT axilla. No suspicious axillary lymph nodes are visualized.   Targeted ultrasound was performed of the RIGHT upper outer breast. There is incidental sonographic note of an oval hypoechoic mass with relatively circumscribed margins at 12 o'clock 8 cm from the nipple. This measures 7 x 4 by 6 mm. It is located approximately 3.5 cm from the margin of the dominant mass.   There is incidental sonographic note of a hypoechoic non mass area at 9 o'clock 12 cm from the nipple. This measures approximately 6 x 3 x 5 mm.   At 10 o'clock 10 cm from the nipple, there is a superficial oval anechoic mass with posterior acoustic shadowing and suggestion of a mild adjacent echogenic rind. This measures 3 by 4 x 3 mm. BB was placed at the site and repeat spot tangential imaging was performed. This demonstrates a small amount of internal fat density (spot tangential slice 34) at this site. This corresponds to the superficial mass noted mammographically and is favored to reflect benign fat necrosis.   IMPRESSION: 1. There is a suspicious 17 mm mass at the site of CT and mammographic concern in the  RIGHT breast. It demonstrates associated pleomorphic calcifications. Recommend ultrasound-guided biopsy for definitive characterization. 2. There is incidental sonographic note of a 7 mm mass in the RIGHT breast at 12 o'clock and a 6 mm non mass area at 9 o'clock. Recommend ultrasound-guided biopsy of these 2 areas for definitive characterization given suspicious appearance of the dominant mass. 3. No suspicious RIGHT axillary adenopathy. 4. No  mammographic evidence of malignancy in the LEFT breast.   RECOMMENDATION: RIGHT breast ultrasound-guided biopsy x3   I have discussed the findings and recommendations with the patient. The biopsy procedure was discussed with the patient and questions were answered. Patient expressed their understanding of the biopsy recommendation. Patient will be scheduled for biopsy at her earliest convenience by the schedulers. Ordering provider will be notified. If applicable, a reminder letter will be sent to the patient regarding the next appointment.   BI-RADS CATEGORY  5: Highly suggestive of malignancy.     Electronically Signed   By: Corean Salter M.D.   On: 06/17/2024 13:52   CLINICAL DATA:  Anal squamous cell carcinoma.  * Tracking Code: BO *   EXAM: CT CHEST, ABDOMEN, AND PELVIS WITH CONTRAST   TECHNIQUE: Multidetector CT imaging of the chest, abdomen and pelvis was performed following the standard protocol during bolus administration of intravenous contrast.   RADIATION DOSE REDUCTION: This exam was performed according to the departmental dose-optimization program which includes automated exposure control, adjustment of the mA and/or kV according to patient size and/or use of iterative reconstruction technique.   CONTRAST:  OMNIPAQUE  IOHEXOL  300 MG/ML  SOLN   COMPARISON:  CTA chest 11/27/2022. Abdominal ultrasound limited 08/02/2023. MRI abdomen 11/29/2022.   FINDINGS: CT CHEST FINDINGS   Cardiovascular: No  significant vascular findings. Normal heart size. No pericardial effusion.   Mediastinum/Nodes: No enlarged mediastinal, hilar, or axillary lymph nodes. Thyroid  gland, trachea, and esophagus demonstrate no significant findings.   Lungs/Pleura: No dominant lung nodule. Lungs are clear. No pleural effusion or pneumothorax.   Musculoskeletal: Mild scattered degenerative changes identified. Of note there is a nodular focus along the upper aspect of the right breast on series 2, image 13 measuring 14 mm. This was not clearly seen on previous CT scan. Please correlate with dedicated mammographic evaluation and possible ultrasound.   CT ABDOMEN PELVIS FINDINGS   Hepatobiliary: Fatty liver infiltration. There is a low-attenuation lesion identified posteriorly in segment 7. On prior MRI this was felt to be a hemangioma measured at that time 2.2 x 1.7 cm. Today 2.4 by 1.5 cm, not significantly changed. No other space-occupying liver lesion. Gallbladder is present. Patent portal vein.   Pancreas: Unremarkable. No pancreatic ductal dilatation or surrounding inflammatory changes.   Spleen: Normal in size without focal abnormality.   Adrenals/Urinary Tract: Adrenal glands are unremarkable. Kidneys are normal, without renal calculi, focal lesion, or hydronephrosis. Bladder is unremarkable.   Stomach/Bowel: Slight nodular thickening in the area of the anal region. Please correlate with history of neoplasm. Otherwise large bowel has a normal course and caliber with scattered stool. Oral contrast was administered. Normal appendix. The stomach and small bowel are nondilated.   Vascular/Lymphatic: Normal caliber aorta and IVC.   Abnormal lymph node identified in the left inguinal region. Lesion measures 19 by 17 mm on series 2, image 113. Other nodes in this location are small. There is a prominent right inguinal node on series 2, image 113 as well measuring 14 by 9 mm. Borderline size. No  abnormal nodal enlargement otherwise along the retroperitoneum, pelvis or elsewhere.   Reproductive: Left-sided ovarian cystic lesion identified measuring 2.6 cm. Uterus is present.   Other: No free air or free fluid.   Musculoskeletal: Mild curvature of the spine. Scattered degenerative changes.   IMPRESSION: Slight thickening in the area of the anal canal. Please correlate for history of neoplasm. There is a pathologic enlarged left inguinal node. There is  a borderline right-sided node.   No additional areas of abnormal nodal enlargement or other aggressive appearing mass lesion at this time.   Fatty liver infiltration with known segment 7 hepatic hemangioma.   Of note there is a right-sided upper breast mass which was not clearly seen on prior CT scan. Please correlate for any prior study or if needed diagnostic mammographic evaluation and possible ultrasound when appropriate.     Electronically Signed   By: Ranell Bring M.D.   On: 06/12/2024 10:49       Assessment:    Malignant neoplasm of upper-outer quadrant of right female breast, unspecified estrogen receptor status (CMS/HHS-HCC) [C50.411]   Plan:    Plan 1. Malignant neoplasm of upper-outer quadrant of right female breast, unspecified estrogen receptor status (CMS/HHS-HCC) [C50.411]  Discussed the risk of surgery including recurrence, chronic pain, post-op infxn, poor/delayed wound healing, poor cosmesis, seroma, hematoma formation, and possible re-operation to address said risks. The risks of general anesthetic, if used, includes MI, CVA, sudden death or even reaction to anesthetic medications also discussed.  Typical post-op recovery time and possbility of activity restrictions were also discussed.  Alternatives include continued observation.  Benefits include possible symptom relief, pathologic evaluation, and/or curative excision.    The patient verbalized understanding and all questions were answered to the  patient's satisfaction.   2. Right breast cancer- positive margins post lumpectomy, recommend additional excision.  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 Discussed with oncology to hold lovenox  night prior and resume when safe to do so postop.  Will still proceed with excision otherwise. discussed with patient over phone and all questions answered at this time.  labs/images/medications/previous chart entries reviewed personally and relevant changes/updates noted above.

## 2024-07-24 NOTE — H&P (Signed)
 Subjective:    CC: Malignant neoplasm of upper-outer quadrant of right female breast, unspecified estrogen receptor status (CMS/HHS-HCC) [C50.411] HPI:  referred by [name], MD for evaluation of above. Change was noted on CT for anal CA mets workup.  Subsequent mammo bx results noted below.  Pt has not noticed the lump before.  Never had mammo recommended to her either.  S/p lumpectomy but now with positive margins   History of Present Illness   G5P3.  Menarch around 10.  First born at 65, did not breastfeed.  LMP. No FH of breast related issues     Past Medical History:  has a past medical history of Acute stomach ulcer, Anemia, Anxiety, Asthma (HHS-HCC), Bronchitis, chronic (CMS/HHS-HCC), GERD (gastroesophageal reflux disease), Heart murmur, Hiatal hernia, History of cancer, History of pneumonia, Kidney stones, Lupus, and Vulvar intraepithelial neoplasia (VIN) grade 3.   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   Family History: family history includes Asthma in her mother; Atrial fibrillation (Abnormal heart rhythm sometimes requiring treatment with blood thinners) in her maternal grandmother; Autoimmune disease in her maternal grandmother; COPD in her mother; Cancer in her paternal grandfather; Clotting disorder in her maternal grandmother; Coronary Artery Disease (Blocked arteries around heart) in her father and mother; Diabetes type II in her mother; Heart failure in her maternal grandmother; High blood pressure (Hypertension) in her maternal grandmother; Kidney failure in her mother; Lupus in her maternal grandmother; Myocardial Infarction (Heart attack) in her mother; No Known Problems in her half-brother and half-sister; Prostate cancer in her maternal  grandfather.   Social History:  reports that she quit smoking about 22 years ago. Her smoking use included cigarettes. She has never used smokeless tobacco. She reports current alcohol use. She reports that she does not use drugs.   Current Medications: has a current medication list which includes the following prescription(s): buspirone, calcium carbonate, celecoxib, clonazepam, cyclobenzaprine , duloxetine, hydrocodone -acetaminophen , ipratropium-albuterol , lidocaine , linaclotide, mag hydrox/aluminum hyd/simeth, polyethylene glycol, promethazine -dextromethorphan, sumatriptan, tranexamic acid, trazodone, albuterol , albuterol  mdi (proventil , ventolin , proair ) hfa, and gabapentin.   Allergies:           Allergies as of 06/24/2024 - Reviewed 06/24/2024  Allergen Reaction Noted   Prednisone Swelling and Anaphylaxis 05/03/2015      ROS:  A 15 point review of systems was performed and was negative except as noted in HPI   Objective:    Objective BP 109/75   Pulse 62   Ht 149.9 cm (4' 11)   Wt 88 kg (194 lb)   BMI 39.18 kg/m    Constitutional :  No distress, cooperative, alert  Lymphatics/Throat:  Supple with no lymphadenopathy  Respiratory:  Clear to auscultation bilaterally  Cardiovascular:  Regular rate and rhythm  Gastrointestinal: Soft, non-tender, non-distended, no organomegaly.  Musculoskeletal: Steady gait and movement  Skin: Cool and moist  Psychiatric: Normal affect, non-agitated, not confused  Breast: Normal appearance and no palpable abnormality in left breast and axilla.  Post bx changes noted in right with bruising, subtly palpable lump in area of concern. No right axillary lymphadenopathy. Chaperone present for exam.      LABS:  SURGICAL PATHOLOGY SURGICAL PATHOLOGY [facility] [address] [place], [place] [address] Telephone [phone] or [phone] Fax [phone]  REPORT OF SURGICAL  PATHOLOGY   Accession #:  [id number] Patient Name: Monica Gonzalez, Monica Gonzalez Visit # : [id number]  MRN: [id number] Physician: Vanice Ozell Revel DOB/Age [birth year] (Age: 48) Gender: F Collected Date: 06/19/2024 Received Date: 06/19/2024  FINAL DIAGNOSIS       1. Lymph node, biopsy, left inguinal adenopathy :      -  METASTATIC MODERATELY DIFFERENTIATED SQUAMOUS CELL CARCINOMA.      NOTE: DR. SHARMA HAS PEER REVIEWED THE CASE AND AGREES WITH THE INTERPRETATION.       DATE SIGNED OUT: 06/20/2024 ELECTRONIC SIGNATURE : Legolvan Do, Mark, Pathologist, Electronic Signature  MICROSCOPIC DESCRIPTION  CASE COMMENTS STAINS USED IN DIAGNOSIS: H&E    CLINICAL HISTORY  SPECIMEN(S) OBTAINED 1. Lymph node, biopsy, Left Inguinal Adenopathy  SPECIMEN COMMENTS: SPECIMEN CLINICAL INFORMATION: 1. MET DZ.  Anal    Gross Description 1. Received in formalin are multiple fragments of pink-tan soft tissue aggregating to 1.9 x 0.1 x 0.1 cm submitted entirely in 1 block. mb 06-19-24        Report signed out from the following location(s) [place].  HOSP[facility] [address] [place], [place] [address] CLIA #: [id number]  [facility] [address] [place], [place] [address] CLIA #: [id number]    SURGICAL PATHOLOGY SURGICAL PATHOLOGY [facility] [address], [address] [place], [place] [address] Telephone [phone] or [phone] Fax [phone]  REPORT OF SURGICAL PATHOLOGY   Accession #: [id number] Patient Name: Monica Gonzalez Visit # : [id number]  MRN: [id number] Physician: Correne Krabbe DOB/Age [birth year] (Age: 53) Gender: F Collected Date: 06/18/2024 Received Date: 06/18/2024  FINAL DIAGNOSIS       1. Breast, right, needle core biopsy, 12:00 12cmfn (heart clip) :       INVASIVE DUCTAL CARCINOMA      TUBULE FORMATION: SCORE 3      NUCLEAR PLEOMORPHISM: SCORE 3      MITOTIC COUNT: SCORE 2      TOTAL SCORE: 8      OVERALL GRADE: 3       LYMPHOVASCULAR INVASION: NOT IDENTIFIED      CANCER LENGTH: 1.0 CM      CALCIFICATIONS: NOT IDENTIFIED      OTHER FINDINGS: NONE      SEE NOTE       2. Breast, right, needle core biopsy, 12:00 8cmfn (coil clip) :      INVASIVE DUCTAL CARCINOMA      DUCTAL CARCINOMA IN SITU, SOLID, HIGH NUCLEAR GRADE WITH NECROSIS      TUBULE FORMATION: SCORE 3      NUCLEAR PLEOMORPHISM: SCORE 3      MITOTIC COUNT: SCORE 2      TOTAL SCORE: 8      OVERALL GRADE: 3      LYMPHOVASCULAR INVASION: NOT IDENTIFIED      CANCER LENGTH: 0.3 CM      CALCIFICATIONS: NOT IDENTIFIED      OTHER FINDINGS: NONE      SEE NOTE       3. Breast, right, needle core biopsy, 9:00 12cmfn (venus) :      BENIGN BREAST TISSUE WITH DENSE STROMAL FIBROSIS.      NEGATIVE FOR ATYPIA OR MALIGNANCY.       Diagnosis Note :      DR. Wolford REVIEWED THE CASE AND CONCURS WITH THE INTERPRETATION.  A BREAST      PROGNOSTIC PROFILE (ER AND PR) IS PENDING AND WILL BE REPORTED IN AN ADDENDUM.  Rock Hover WAS NOTIFIED ON 06/19/2024.      ELECTRONIC SIGNATURE : Pepper Dutton Md, Pathologist, Electronic Signature  MICROSCOPIC DESCRIPTION  CASE COMMENTS STAINS USED IN DIAGNOSIS: H&E-2 H&E-3 H&E-4 H&E *RECUT 1 SLIDE H&E-2 H&E-3 H&E-4 H&E H&E-2 H&E-3 H&E-4 H&E Stains used in diagnosis 1 Her2 by IHC, 1 ER-ACIS, 1 KI-67-ACIS, 1 PR-ACIS IHC scores are reported using ASCO/CAP scoring criteria.  An IHC Score of 0 or 1+  is NEGATIVE for HER2, 3+ is POSITIVE for HER2, and 2+ is EQUIVOCAL. Equivocal results are reflexed to either FISH or IHC testing. Specimens are fixed in 10% Neutral Buffered Formalin for at least 6 hours and up to 72 hours. These tests have not be validated on decalcified tissue.  Results should be interpreted with caution given the possibility of false negative results on decalcified specimens. Antibody Clone for HER2 is 4B5 (PATHWAY). Some of these immunohistochemical stains may have been developed and the  performance characteristics determined by [facility].  Some may not have been cleared or approved by the U.S. Food and Drug Administration.  The FDA has determined that such clearance or approval is not necessary.  This test is used for clinical purposes.  It should not be regarded as investigational or for research.  This laboratory is certified under the Clinical Laboratory Improvement Amendments of 1988 (CLIA-88) as qualified to perform high complexity clinical laboratory testing. Estrogen receptor (6F11), immunohistochemical stains are performed on formalin fixed, paraffin embedded tissue using a 3,3-diaminobenzidine (DAB) chromogen and Leica Bond Autostainer System.  The staining intensity of the nucleus is scored manually and is reported as the percentage of tumor cell nuclei demonstrating specific nuclear staining.Specimens are fixed in 10% Neutral Buffered Formalin for at least 6 hours and up to 72 hours.  These tests have not be validated on decalcified tissue.  Results should be interpreted with caution given the possibility of false negative results on decalcified specimens. Ki-67 (MM1), immunohistochemical stains are performed on formalin fixed, paraffin embedded tissue using a 3,3-diaminobenzidine (DAB) chromogen and Leica Bond Autostainer System.  The staining intensity of the nucleus is scored manually and is reported as the percentage of tumor cell nuclei demonstrating specific nuclear staining.Specimens are fixed in 10% Neutral Buffered Formalin for at least 6 hours and up to 72 hours. These tests have not be validated on decalcified tissue.  Results should be interpreted with caution given the possibility of false negative results on decalcified specimens. PR progesterone receptor (16), immunohistochemical stains are performed on formalin fixed, paraffin embedded tissue using a 3,3-diaminobenzidine (DAB) chromogen and Leica Bond Autostainer System.  The  staining intensity of the nucleus is scored manually and is reported as the percentage of tumor cell nuclei demonstrating specific nuclear staining.Specimens are fixed in 10% Neutral Buffered Formalin for at least 6 hours and up to 72 hours. These tests have not be validated on decalcified tissue.  Results should be interpreted with caution given the possibility of false negative results on decalcified specimens.  ADDENDUM 1A) Breast, right, needle core biopsy, 12:00 12cmfn (heart clip) PROGNOSTIC INDICATORS  Results: IMMUNOHISTOCHEMICAL AND MORPHOMETRIC ANALYSIS PERFORMED MANUALLY 240The tumor cells are NEGATIVE for Her2 (0). Estrogen Receptor:  0%, NEGATIVE Progesterone Receptor:  0%, NEGATIVE Proliferation Marker Ki67:  95% COMMENT:  The negative hormone receptor study(ies) in this case has no internal positive control.  REFERENCE RANGE ESTROGEN RECEPTOR NEGATIVE     0% POSITIVE       =>1% REFERENCE RANGE PROGESTERONE RECEPTOR NEGATIVE  0% POSITIVE        =>1% All controls stained appropriately Belvie Rolla Rush, Pathologist, Electronic Signature [phone] 07 2025)   CLINICAL HISTORY  SPECIMEN(S) OBTAINED 1. Breast, right, needle core biopsy, 12:00 12cmfn (heart Clip) 2. Breast, right, needle core biopsy, 12:00 8cmfn (coil Clip) 3. Breast, right, needle core biopsy, 9:00 12cmfn (venus)  SPECIMEN COMMENTS: 1. TIF: 14:18pm, CIT: less than 30 sec 2. TIF: 14:21pm, CIT: less than 30 sec 3. TIF: 14:37pm, CIT: Less than 30 sec SPECIMEN CLINICAL INFORMATION: 1. Suspicioius right breast mass with calcs (site 1) sonographically indentified areas in right breast (site2 and 3)    Gross Description 1. Received in formalin labeled with the patient's name right breast 12 o'clock, 12 cm from nipple, and consists of multiple core and core fragments of tan yellow fibroadipose tissue, ranging from 0.2 cm to 1.5 x 0.2 x 0.2 cm.The specimen is entirely submitted in one  cassette.      TIF 2:18 p.m. on 06/18/24. CIT less than 30 seconds 2. Received in formalin labeled with the patient's name and right breast 12 o'clock, 8 cm from nipple and consists of multiple core and core fragments of tan pink fibroadipose tissue, ranging from 0.1 cm to 1.2 x 0.2 x 0.2 cm.The specimen is entirely submitted in one cassette.      TIF 2:21 p.m. on 06/18/24.  CIT less than 30 seconds 3. Received in formalin labeled with the patient's name and right breast 9 o'clock, 12 cm from nipple and consists of multiple core and core fragment of tan yellow fibroadipose tissue, ranging from 0.1 cm to 1.3 x 0.2 x 0.2 cm.The specimen is entirely submitted in one cassette.      TIF 2:37 p.m. on 06/18/24.  CIT less than 30 seconds.  (KL:gt, 06/19/24)        Report signed out from the following location(s) [place].  HOSPITAL [address] [place], [place] [address] CLIA #: [id number]  [facility] [address] [place], [place] [address] CLIA #: [id number]      RADS: ADDENDUM REPORT: 06/19/2024 14:32   ADDENDUM: PATHOLOGY revealed: Site 1. 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   Pathology results are CONCORDANT with imaging findings, per Corean Salter M.D.   PATHOLOGY revealed: Site 2. Breast, right, needle core biopsy, 12:00; 8 cmfn (coil clip) : INVASIVE DUCTAL CARCINOMA - DUCTAL CARCINOMA IN SITU, SOLID, HIGH NUCLEAR GRADE WITH NECROSIS. OVERALL GRADE: 3 -   LYMPHOVASCULAR INVASION: NOT IDENTIFIED. CANCER LENGTH: 0.3 CM. CALCIFICATIONS: NOT IDENTIFIED   Pathology results are CONCORDANT with imaging findings, per Corean Salter M.D.   PATHOLOGY revealed: Site 3. Breast, right, needle core biopsy, 9:00; 12 cmfn (venus) : BENIGN BREAST TISSUE WITH DENSE STROMAL  FIBROSIS. NEGATIVE FOR ATYPIA OR MALIGNANCY.   Pathology results are CONCORDANT with imaging findings, per Corean Salter M.D.   Pathology results and recommendations below were discussed with patient by telephone on 06/19/2024 by Rock Hover RN. Patient reported biopsy site within normal limits with slight tenderness at the site. Post biopsy care instructions were reviewed, questions were answered and my direct phone number was provided to patient. Patient was instructed to call [facility] if any concerns or questions arise related to the biopsy.   RECOMMENDATIONS: 1. Surgical and oncological consultation. Request for surgical and oncological  consultation relayed to Shasta Ada RN at [facility] by Rock Hover RN on 06/19/2024.   2. Recommend pretreatment bilateral breast MRI with and without contrast to assess extent of breast disease, given the patient's breast density and age.   3. The 2 sites of biopsy-proven malignancy (COIL and heart clip) are approximately 4.7 cm apart.   Pathology results reported by Rock Hover RN on 06/19/2024.     Electronically Signed   By: Corean Salter M.D.   On: 06/19/2024 14:32   CLINICAL DATA:  Patient with recently diagnosed anal cancer. On staging examination, a mass was noted in the RIGHT upper breast.   EXAM: DIGITAL DIAGNOSTIC BILATERAL MAMMOGRAM WITH TOMOSYNTHESIS AND CAD; ULTRASOUND RIGHT BREAST LIMITED   TECHNIQUE: Bilateral digital diagnostic mammography and breast tomosynthesis was performed. The images were evaluated with computer-aided detection. ; Targeted ultrasound examination of the right breast was performed   COMPARISON:  None available.   ACR Breast Density Category c: The breasts are heterogeneously dense, which may obscure small masses.   FINDINGS: Diagnostic images of the RIGHT breast demonstrate an irregular mass with associated pleomorphic calcifications in the upper breast  at middle depth. Mass is estimated to span 17 mm. Associated calcifications are estimated to span approximately 17 mm.   A superficial oval mass noted in the RIGHT upper outer breast on true lateral slice 57 with possible internal fat density. No suspicious mass, distortion, or microcalcifications are identified to suggest presence of malignancy in the LEFT breast.   On physical exam, there is a hard mass in the upper breast.   Targeted ultrasound was performed of the upper breast. There is an irregular hypoechoic mass at 12 o'clock 12 cm from the nipple with indistinct margins. It demonstrates multiple associated echogenic foci and posterior acoustic shadowing. It spans approximately 15 x 11 x 12 mm and corresponds to the site of mammographic concern.   Targeted ultrasound was performed of the RIGHT axilla. No suspicious axillary lymph nodes are visualized.   Targeted ultrasound was performed of the RIGHT upper outer breast. There is incidental sonographic note of an oval hypoechoic mass with relatively circumscribed margins at 12 o'clock 8 cm from the nipple. This measures 7 x 4 by 6 mm. It is located approximately 3.5 cm from the margin of the dominant mass.   There is incidental sonographic note of a hypoechoic non mass area at 9 o'clock 12 cm from the nipple. This measures approximately 6 x 3 x 5 mm.   At 10 o'clock 10 cm from the nipple, there is a superficial oval anechoic mass with posterior acoustic shadowing and suggestion of a mild adjacent echogenic rind. This measures 3 by 4 x 3 mm. BB was placed at the site and repeat spot tangential imaging was performed. This demonstrates a small amount of internal fat density (spot tangential slice 34) at this site. This corresponds to the superficial mass noted mammographically and is favored to reflect benign fat necrosis.   IMPRESSION: 1. There is a suspicious 17 mm mass at the site of CT and mammographic concern in the  RIGHT breast. It demonstrates associated pleomorphic calcifications. Recommend ultrasound-guided biopsy for definitive characterization. 2. There is incidental sonographic note of a 7 mm mass in the RIGHT breast at 12 o'clock and a 6 mm non mass area at 9 o'clock. Recommend ultrasound-guided biopsy of these 2 areas for definitive characterization given suspicious appearance of the dominant mass. 3. No suspicious RIGHT axillary adenopathy. 4. No  mammographic evidence of malignancy in the LEFT breast.   RECOMMENDATION: RIGHT breast ultrasound-guided biopsy x3   I have discussed the findings and recommendations with the patient. The biopsy procedure was discussed with the patient and questions were answered. Patient expressed their understanding of the biopsy recommendation. Patient will be scheduled for biopsy at her earliest convenience by the schedulers. Ordering provider will be notified. If applicable, a reminder letter will be sent to the patient regarding the next appointment.   BI-RADS CATEGORY  5: Highly suggestive of malignancy.     Electronically Signed   By: Corean Salter M.D.   On: 06/17/2024 13:52   CLINICAL DATA:  Anal squamous cell carcinoma.  * Tracking Code: BO *   EXAM: CT CHEST, ABDOMEN, AND PELVIS WITH CONTRAST   TECHNIQUE: Multidetector CT imaging of the chest, abdomen and pelvis was performed following the standard protocol during bolus administration of intravenous contrast.   RADIATION DOSE REDUCTION: This exam was performed according to the departmental dose-optimization program which includes automated exposure control, adjustment of the mA and/or kV according to patient size and/or use of iterative reconstruction technique.   CONTRAST:  OMNIPAQUE  IOHEXOL  300 MG/ML  SOLN   COMPARISON:  CTA chest 11/27/2022. Abdominal ultrasound limited 08/02/2023. MRI abdomen 11/29/2022.   FINDINGS: CT CHEST FINDINGS   Cardiovascular: No  significant vascular findings. Normal heart size. No pericardial effusion.   Mediastinum/Nodes: No enlarged mediastinal, hilar, or axillary lymph nodes. Thyroid  gland, trachea, and esophagus demonstrate no significant findings.   Lungs/Pleura: No dominant lung nodule. Lungs are clear. No pleural effusion or pneumothorax.   Musculoskeletal: Mild scattered degenerative changes identified. Of note there is a nodular focus along the upper aspect of the right breast on series 2, image 13 measuring 14 mm. This was not clearly seen on previous CT scan. Please correlate with dedicated mammographic evaluation and possible ultrasound.   CT ABDOMEN PELVIS FINDINGS   Hepatobiliary: Fatty liver infiltration. There is a low-attenuation lesion identified posteriorly in segment 7. On prior MRI this was felt to be a hemangioma measured at that time 2.2 x 1.7 cm. Today 2.4 by 1.5 cm, not significantly changed. No other space-occupying liver lesion. Gallbladder is present. Patent portal vein.   Pancreas: Unremarkable. No pancreatic ductal dilatation or surrounding inflammatory changes.   Spleen: Normal in size without focal abnormality.   Adrenals/Urinary Tract: Adrenal glands are unremarkable. Kidneys are normal, without renal calculi, focal lesion, or hydronephrosis. Bladder is unremarkable.   Stomach/Bowel: Slight nodular thickening in the area of the anal region. Please correlate with history of neoplasm. Otherwise large bowel has a normal course and caliber with scattered stool. Oral contrast was administered. Normal appendix. The stomach and small bowel are nondilated.   Vascular/Lymphatic: Normal caliber aorta and IVC.   Abnormal lymph node identified in the left inguinal region. Lesion measures 19 by 17 mm on series 2, image 113. Other nodes in this location are small. There is a prominent right inguinal node on series 2, image 113 as well measuring 14 by 9 mm. Borderline size. No  abnormal nodal enlargement otherwise along the retroperitoneum, pelvis or elsewhere.   Reproductive: Left-sided ovarian cystic lesion identified measuring 2.6 cm. Uterus is present.   Other: No free air or free fluid.   Musculoskeletal: Mild curvature of the spine. Scattered degenerative changes.   IMPRESSION: Slight thickening in the area of the anal canal. Please correlate for history of neoplasm. There is a pathologic enlarged left inguinal node. There is  a borderline right-sided node.   No additional areas of abnormal nodal enlargement or other aggressive appearing mass lesion at this time.   Fatty liver infiltration with known segment 7 hepatic hemangioma.   Of note there is a right-sided upper breast mass which was not clearly seen on prior CT scan. Please correlate for any prior study or if needed diagnostic mammographic evaluation and possible ultrasound when appropriate.     Electronically Signed   By: Ranell Bring M.D.   On: 06/12/2024 10:49       Assessment:    Malignant neoplasm of upper-outer quadrant of right female breast, unspecified estrogen receptor status (CMS/HHS-HCC) [C50.411]   Plan:    Plan 1. Malignant neoplasm of upper-outer quadrant of right female breast, unspecified estrogen receptor status (CMS/HHS-HCC) [C50.411]  Discussed the risk of surgery including recurrence, chronic pain, post-op infxn, poor/delayed wound healing, poor cosmesis, seroma, hematoma formation, and possible re-operation to address said risks. The risks of general anesthetic, if used, includes MI, CVA, sudden death or even reaction to anesthetic medications also discussed.  Typical post-op recovery time and possbility of activity restrictions were also discussed.  Alternatives include continued observation.  Benefits include possible symptom relief, pathologic evaluation, and/or curative excision.    The patient verbalized understanding and all questions were answered to the  patient's satisfaction.   2. Right breast cancer- positive margins post lumpectomy, recommend additional excision.  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 Discussed with oncology to hold lovenox  night prior and resume when safe to do so postop.  Will still proceed with excision otherwise. discussed with patient over phone and all questions answered at this time.  labs/images/medications/previous chart entries reviewed personally and relevant changes/updates noted above.

## 2024-07-24 NOTE — Telephone Encounter (Signed)
 Dr. Babara has finalized chemo is IS. Please schedule and notify pt of appts:   Chemo Edu: 5FU/ mitomycin  Lab/MD/ Fluorouracil/ Mitomycin *NEW* (per IS request

## 2024-07-24 NOTE — Patient Instructions (Addendum)
 Your procedure is scheduled on:  THURSDAY SEPTEMBER 11 Report to the Registration Desk on the 1st floor of the [facility] To find out your arrival time, please call [phone] between 1PM - 3PM on:  [facility] SEPTEMBER 10  If your arrival time is 6:00 am, do not arrive before that time as the [facility] entrance doors do not open until 6:00 am. **Your arrival time is 7:15**  REMEMBER: Instructions that are not followed completely may result in serious medical risk, up to and including death; or upon the discretion of your surgeon and anesthesiologist your surgery may need to be rescheduled.  Do not eat food after midnight the night before surgery.  No gum chewing or hard candies.   One week prior to surgery: Stop Anti-inflammatories (NSAIDS) such as Advil , Aleve , Ibuprofen , Motrin , Naproxen , Naprosyn  and Aspirin based products such as Excedrin, Goody's Powder, BC Powder. Stop ANY OVER THE COUNTER supplements until after surgery. ascorbic acid (VITAMIN C)  B Complex-C (SUPER B COMPLEX PO)  BIOTIN  cholecalciferol (VITAMIN D3)  Multiple Vitamin (MULTIVITAMIN WITH MINERALS)  Turmeric (QC TUMERIC COMPLEX PO)   You may however, continue to take Tylenol  if needed for pain up until the day of surgery.  **Follow recommendations regarding stopping blood thinners.** enoxaparin  (LOVENOX ) hold evening dose on 9/10 and morning dose on 9/11 then can resume lovenox  after surgery.   Continue taking all of your other prescription medications up until the day of surgery.  ON THE DAY OF SURGERY ONLY TAKE THESE MEDICATIONS WITH SIPS OF WATER :  busPIRone (BUSPAR)  celecoxib (CELEBREX)  gabapentin (NEURONTIN)  methylPREDNISolone  (MEDROL  DOSEPAK)   Use inhalers on the day of surgery and bring to the hospital. albuterol  (PROVENTIL )  ipratropium (ATROVENT  HFA )  No Alcohol for 24 hours before or after surgery.  Do not use any recreational drugs for at least a week (preferably 2 weeks)  before your surgery.  Please be advised that the combination of cocaine and anesthesia may have negative outcomes, up to and including death. If you test positive for cocaine, your surgery will be cancelled.  On the morning of surgery brush your teeth with toothpaste and water , you may rinse your mouth with mouthwash if you wish. Do not swallow any toothpaste or mouthwash.  Use CHG Soap as directed on instruction sheet.  Do not wear jewelry, make-up, hairpins, clips or nail polish.  For welded (permanent) jewelry: bracelets, anklets, waist bands, etc.  Please have this removed prior to surgery.  If it is not removed, there is a chance that hospital personnel will need to cut it off on the day of surgery.  Do not wear lotions, powders, or perfumes.   Do not shave body hair from the neck down 48 hours before surgery.  Contact lenses, hearing aids and dentures may not be worn into surgery.  Do not bring valuables to the hospital. [facility] is not responsible for any missing/lost belongings or valuables.   Notify your doctor if there is any change in your medical condition (cold, fever, infection).  Wear comfortable clothing (specific to your surgery type) to the hospital.  After surgery, you can help prevent lung complications by doing breathing exercises.  Take deep breaths and cough every 1-2 hours.   If you are being discharged the day of surgery, you will not be allowed to drive home. You will need a responsible individual to drive you home and stay with you for 24 hours after surgery.   If you  are taking public transportation, you will need to have a responsible individual with you.  Please call the Pre-admissions Testing Dept. at [phone] if you have any questions about these instructions.  Surgery Visitation Policy:  Patients having surgery or a procedure may have two visitors.  Children under the age of 15 must have an adult with them who is not the  patient.   Merchandiser, retail to address health-related social needs:  [email or web address]

## 2024-07-25 ENCOUNTER — Ambulatory Visit
Admission: RE | Admit: 2024-07-25 | Discharge: 2024-07-25 | Disposition: A | Discharge status: Home / Self Care | Attending: Surgery | Admitting: Surgery

## 2024-07-25 ENCOUNTER — Encounter: Payer: Self-pay | Admitting: Surgery

## 2024-07-25 ENCOUNTER — Ambulatory Visit

## 2024-07-25 ENCOUNTER — Ambulatory Visit: Payer: Self-pay | Admitting: Urgent Care

## 2024-07-25 ENCOUNTER — Other Ambulatory Visit: Payer: Self-pay

## 2024-07-25 ENCOUNTER — Encounter
Admission: RE | Disposition: A | Discharge status: Home / Self Care | Payer: Self-pay | Source: Home / Self Care | Attending: Surgery

## 2024-07-25 ENCOUNTER — Encounter: Payer: Self-pay | Admitting: Oncology

## 2024-07-25 ENCOUNTER — Ambulatory Visit: Admitting: Anesthesiology

## 2024-07-25 ENCOUNTER — Ambulatory Visit: Admitting: Pulmonary Disease

## 2024-07-25 DIAGNOSIS — J45909 Unspecified asthma, uncomplicated: Secondary | ICD-10-CM | POA: Insufficient documentation

## 2024-07-25 DIAGNOSIS — C50411 Malignant neoplasm of upper-outer quadrant of right female breast: Secondary | ICD-10-CM | POA: Diagnosis present

## 2024-07-25 DIAGNOSIS — K219 Gastro-esophageal reflux disease without esophagitis: Secondary | ICD-10-CM | POA: Diagnosis not present

## 2024-07-25 DIAGNOSIS — C210 Malignant neoplasm of anus, unspecified: Secondary | ICD-10-CM | POA: Diagnosis not present

## 2024-07-25 DIAGNOSIS — Z01812 Encounter for preprocedural laboratory examination: Secondary | ICD-10-CM

## 2024-07-25 HISTORY — PX: BREAST LUMPECTOMY: SHX2

## 2024-07-25 LAB — POCT PREGNANCY, URINE: Preg Test, Ur: NEGATIVE

## 2024-07-25 SURGERY — BREAST LUMPECTOMY
Anesthesia: General | Site: Breast | Laterality: Right

## 2024-07-25 MED ORDER — ONDANSETRON HCL 4 MG/2ML IJ SOLN
INTRAMUSCULAR | Status: AC
  Filled 2024-07-25: qty 2

## 2024-07-25 MED ORDER — OXYCODONE HCL 5 MG/5ML PO SOLN: 5.0000 mg | Freq: Once | ORAL | Status: AC | PRN

## 2024-07-25 MED ORDER — CEFAZOLIN SODIUM-DEXTROSE 2-4 GM/100ML-% IV SOLN
2.0000 g | INTRAVENOUS | Status: AC
  Administered 2024-07-25: 2 g via INTRAVENOUS

## 2024-07-25 MED ORDER — MIDAZOLAM HCL 2 MG/2ML IJ SOLN
INTRAMUSCULAR | Status: DC | PRN
Start: 2024-07-25 — End: 2024-07-25
  Administered 2024-07-25: 2 mg via INTRAVENOUS

## 2024-07-25 MED ORDER — ROCURONIUM BROMIDE 10 MG/ML (PF) SYRINGE
PREFILLED_SYRINGE | INTRAVENOUS | Status: AC
  Filled 2024-07-25: qty 10

## 2024-07-25 MED ORDER — FENTANYL CITRATE (PF) 100 MCG/2ML IJ SOLN
INTRAMUSCULAR | Status: AC
  Filled 2024-07-25: qty 2

## 2024-07-25 MED ORDER — PROPOFOL 10 MG/ML IV BOLUS
INTRAVENOUS | Status: DC | PRN
  Administered 2024-07-25: 200 mg via INTRAVENOUS

## 2024-07-25 MED ORDER — ROCURONIUM BROMIDE 100 MG/10ML IV SOLN
INTRAVENOUS | Status: DC | PRN
  Administered 2024-07-25: 50 mg via INTRAVENOUS

## 2024-07-25 MED ORDER — MIDAZOLAM HCL 2 MG/2ML IJ SOLN
INTRAMUSCULAR | Status: AC
  Filled 2024-07-25: qty 2

## 2024-07-25 MED ORDER — CHLORHEXIDINE GLUCONATE CLOTH 2 % EX PADS
6.0000 | MEDICATED_PAD | Freq: Once | CUTANEOUS | Status: AC
  Administered 2024-07-25: 6 via TOPICAL

## 2024-07-25 MED ORDER — ACETAMINOPHEN 10 MG/ML IV SOLN
INTRAVENOUS | Status: AC
  Filled 2024-07-25: qty 100

## 2024-07-25 MED ORDER — OXYCODONE HCL 5 MG PO TABS
5.0000 mg | ORAL_TABLET | Freq: Once | ORAL | Status: AC | PRN
  Administered 2024-07-25: 5 mg via ORAL

## 2024-07-25 MED ORDER — LIDOCAINE HCL (CARDIAC) PF 100 MG/5ML IV SOSY
PREFILLED_SYRINGE | INTRAVENOUS | Status: DC | PRN
  Administered 2024-07-25: 80 mg via INTRAVENOUS

## 2024-07-25 MED ORDER — LIDOCAINE HCL (PF) 2 % IJ SOLN
INTRAMUSCULAR | Status: AC
  Filled 2024-07-25: qty 5

## 2024-07-25 MED ORDER — FENTANYL CITRATE (PF) 100 MCG/2ML IJ SOLN
25.0000 ug | INTRAMUSCULAR | Status: DC | PRN
  Administered 2024-07-25 (×2): 50 ug via INTRAVENOUS

## 2024-07-25 MED ORDER — LACTATED RINGERS IV SOLN: INTRAVENOUS | Status: DC

## 2024-07-25 MED ORDER — ONDANSETRON HCL 4 MG/2ML IJ SOLN
INTRAMUSCULAR | Status: DC | PRN
  Administered 2024-07-25: 4 mg via INTRAVENOUS

## 2024-07-25 MED ORDER — OXYCODONE HCL 5 MG PO TABS
ORAL_TABLET | ORAL | Status: AC
  Filled 2024-07-25: qty 1

## 2024-07-25 MED ORDER — DIPHENHYDRAMINE HCL 50 MG/ML IJ SOLN
INTRAMUSCULAR | Status: DC | PRN
  Administered 2024-07-25: 12.5 mg via INTRAVENOUS

## 2024-07-25 MED ORDER — CHLORHEXIDINE GLUCONATE 0.12 % MT SOLN
15.0000 mL | Freq: Once | OROMUCOSAL | Status: AC
  Administered 2024-07-25: 15 mL via OROMUCOSAL

## 2024-07-25 MED ORDER — DIPHENHYDRAMINE HCL 50 MG/ML IJ SOLN
INTRAMUSCULAR | Status: AC
  Filled 2024-07-25: qty 1

## 2024-07-25 MED ORDER — ORAL CARE MOUTH RINSE: 15.0000 mL | Freq: Once | OROMUCOSAL | Status: AC

## 2024-07-25 MED ORDER — BUPIVACAINE-EPINEPHRINE 0.5% -1:200000 IJ SOLN
INTRAMUSCULAR | Status: DC | PRN
  Administered 2024-07-25: 15 mL

## 2024-07-25 MED ORDER — PROPOFOL 10 MG/ML IV BOLUS
INTRAVENOUS | Status: AC
  Filled 2024-07-25: qty 20

## 2024-07-25 MED ORDER — BUPIVACAINE-EPINEPHRINE (PF) 0.5% -1:200000 IJ SOLN
INTRAMUSCULAR | Status: AC
  Filled 2024-07-25: qty 30

## 2024-07-25 MED ORDER — TRAMADOL HCL 50 MG PO TABS
50.0000 mg | ORAL_TABLET | Freq: Three times a day (TID) | ORAL | 0 refills | Status: DC | PRN
  Filled 2024-07-25: qty 15, 5d supply, fill #0

## 2024-07-25 MED ORDER — DEXAMETHASONE SODIUM PHOSPHATE 10 MG/ML IJ SOLN
INTRAMUSCULAR | Status: AC
  Filled 2024-07-25: qty 1

## 2024-07-25 MED ORDER — CEFAZOLIN SODIUM-DEXTROSE 2-4 GM/100ML-% IV SOLN
INTRAVENOUS | Status: AC
  Filled 2024-07-25: qty 100

## 2024-07-25 MED ORDER — ACETAMINOPHEN 10 MG/ML IV SOLN
INTRAVENOUS | Status: DC | PRN
  Administered 2024-07-25: 1000 mg via INTRAVENOUS

## 2024-07-25 MED ORDER — PHENYLEPHRINE 80 MCG/ML (10ML) SYRINGE FOR IV PUSH (FOR BLOOD PRESSURE SUPPORT)
PREFILLED_SYRINGE | INTRAVENOUS | Status: AC
  Filled 2024-07-25: qty 10

## 2024-07-25 MED ORDER — DEXMEDETOMIDINE HCL IN NACL 80 MCG/20ML IV SOLN
INTRAVENOUS | Status: DC | PRN
  Administered 2024-07-25: 4 ug via INTRAVENOUS

## 2024-07-25 MED ORDER — SUGAMMADEX SODIUM 200 MG/2ML IV SOLN
INTRAVENOUS | Status: DC | PRN
  Administered 2024-07-25: 200 mg via INTRAVENOUS

## 2024-07-25 MED ORDER — PHENYLEPHRINE 80 MCG/ML (10ML) SYRINGE FOR IV PUSH (FOR BLOOD PRESSURE SUPPORT)
PREFILLED_SYRINGE | INTRAVENOUS | Status: DC | PRN
  Administered 2024-07-25: 80 ug via INTRAVENOUS

## 2024-07-25 MED ORDER — CHLORHEXIDINE GLUCONATE 0.12 % MT SOLN
OROMUCOSAL | Status: AC
Start: 2024-07-25 — End: 2024-07-25
  Filled 2024-07-25: qty 15

## 2024-07-25 MED ORDER — FENTANYL CITRATE (PF) 100 MCG/2ML IJ SOLN
INTRAMUSCULAR | Status: DC | PRN
  Administered 2024-07-25 (×2): 50 ug via INTRAVENOUS

## 2024-07-25 SURGICAL SUPPLY — 27 items
BLADE PHOTON 3 ILLUMINATED (MISCELLANEOUS) ×1 IMPLANT
BLADE SURG 15 STRL LF DISP TIS (BLADE) ×1 IMPLANT
CHLORAPREP W/TINT 26 (MISCELLANEOUS) IMPLANT
DERMABOND ADVANCED .7 DNX12 (GAUZE/BANDAGES/DRESSINGS) ×1 IMPLANT
DEVICE DUBIN SPECIMEN MAMMOGRA (MISCELLANEOUS) ×1 IMPLANT
DRAPE LAPAROTOMY 77X122 PED (DRAPES) ×1 IMPLANT
ELECTRODE REM PT RTRN 9FT ADLT (ELECTROSURGICAL) ×1 IMPLANT
GLOVE BIOGEL PI IND STRL 7.0 (GLOVE) ×1 IMPLANT
GLOVE SURG SYN 6.5 PF PI (GLOVE) ×3 IMPLANT
GOWN STRL REUS W/ TWL LRG LVL3 (GOWN DISPOSABLE) ×3 IMPLANT
KIT MARKER MARGIN INK (KITS) ×1 IMPLANT
KIT TURNOVER KIT A (KITS) ×1 IMPLANT
LABEL OR SOLS (LABEL) ×1 IMPLANT
LIGHT WAVEGUIDE WIDE FLAT (MISCELLANEOUS) IMPLANT
MANIFOLD NEPTUNE II (INSTRUMENTS) ×1 IMPLANT
MARKER MARGIN CORRECT CLIP (MARKER) ×1 IMPLANT
NDL HYPO 22X1.5 SAFETY MO (MISCELLANEOUS) ×1 IMPLANT
NEEDLE HYPO 22X1.5 SAFETY MO (MISCELLANEOUS) ×1 IMPLANT
PACK BASIN MINOR ARMC (MISCELLANEOUS) ×1 IMPLANT
SUT SILK 3 0 12 30 (SUTURE) IMPLANT
SUT VIC AB 3-0 SH 27X BRD (SUTURE) ×1 IMPLANT
SUTURE MNCRL 4-0 27XMF (SUTURE) ×1 IMPLANT
SYR 20ML LL LF (SYRINGE) ×1 IMPLANT
SYR BULB IRRIG 60ML STRL (SYRINGE) ×1 IMPLANT
TRAP FLUID SMOKE EVACUATOR (MISCELLANEOUS) ×1 IMPLANT
TRAP NEPTUNE SPECIMEN COLLECT (MISCELLANEOUS) ×1 IMPLANT
WATER STERILE IRR 500ML POUR (IV SOLUTION) ×1 IMPLANT

## 2024-07-25 NOTE — Op Note (Signed)
 Preoperative diagnosis: Right breast carcinoma, positive margins  Postoperative diagnosis: Same.   Procedure: Excision of positive right breast margins   anesthesia: GETA  Surgeon: Dr. Henriette Pierre  Wound Classification: Clean  Indications: See H&P Specimen: Superior and inferior margins Complications: None  Estimated Blood Loss: 10mL  Findings: 1.  Superior and inferior margin resection with no obvious disease at former lumpectomy site  2. Pathology call refers gross examination of margins was negative  Description of procedure: The patient was taken to the operating room and placed supine on the operating table, and after general anesthesia the right breast and axilla were prepped and draped in the usual sterile fashion. A time-out was completed verifying correct patient, procedure, site, positioning, and implant(s) and/or special equipment prior to beginning this procedure.   Positive margins confirmed on pathology before prior to starting the procedure.  The skin incision was made through previous lumpectomy incision after infusion of local. Flaps were raised and sharp and blunt dissection was then taken down to the former lumpectomy cavity confirmed by visualization of the old hematoma. Superior margin was then resected completely, oriented with paint, passed off field pending pathology.  Gross margin analysis by pathology confirmed all margins cleared on initial inspection.  Inferior margin was then resected completely, oriented with paint, passed off field pending pathology.  Gross margin analysis by pathology confirmed all margins cleared on initial inspection.  Wound irrigated, hemostasis was achieved and the wound closed in layers with  interrupted sutures of 3-0 Vicryl in deep dermal layer and a running subcuticular suture of Monocryl 4-0, then dressed with dermabond.  The patient tolerated the procedure well and was taken to the postanesthesia care unit in stable condition.  Sponge and instrument count correct at end of procedure.

## 2024-07-25 NOTE — Transfer of Care (Signed)
 Immediate Anesthesia Transfer of Care Note  Patient: Monica Gonzalez  Procedure(s) Performed: BREAST LUMPECTOMY (Right: Breast)  Patient Location: PACU  Anesthesia Type:General  Level of Consciousness: awake, alert , oriented, and patient cooperative  Airway & Oxygen Therapy: Patient Spontanous Breathing and Patient connected to face mask oxygen  Post-op Assessment: Report given to RN, Post -op Vital signs reviewed and stable, and Patient moving all extremities X 4  Post vital signs: Reviewed and stable  Last Vitals:  Vitals Value Taken Time  BP 110/72 07/25/24 09:33  Temp    Pulse 87 07/25/24 09:35  Resp 34 07/25/24 09:35  SpO2 95 % 07/25/24 09:35  Vitals shown include unfiled device data.  Last Pain:  Vitals:   07/25/24 0759  TempSrc: Temporal  PainSc: 6          Complications: No notable events documented.

## 2024-07-25 NOTE — Discharge Instructions (Signed)
 Removal, Care After This sheet gives you information about how to care for yourself after your procedure. Your health care provider may also give you more specific instructions. If you have problems or questions, contact your health care provider. What can I expect after the procedure? After the procedure, it is common to have: Soreness. Bruising. Itching. Follow these instructions at home: site care Follow instructions from your health care provider about how to take care of your site. Make sure you: Wash your hands with soap and water  before and after you change your bandage (dressing). If soap and water  are not available, use hand sanitizer. Leave stitches (sutures), skin glue, or adhesive strips in place. These skin closures may need to stay in place for 2 weeks or longer. If adhesive strip edges start to loosen and curl up, you may trim the loose edges. Do not remove adhesive strips completely unless your health care provider tells you to do that. If the area bleeds or bruises, apply gentle pressure for 10 minutes. OK TO SHOWER IN 24HRS  Check your site every day for signs of infection. Check for: Redness, swelling, or pain. Fluid or blood. Warmth. Pus or a bad smell.  General instructions Rest and then return to your normal activities as told by your health care provider.  tylenol  and naproxen  as needed for discomfort.     Use narcotics, if prescribed, only when tylenol  and naproxen  is not enough to control pain.  325-650mg  every 8hrs to max of 3000mg /24hrs (including the 325mg  in every norco dose) for the tylenol .    RESUME LOVENOX  IN 24HRS Keep all follow-up visits as told by your health care provider. This is important. Contact a health care provider if: You have redness, swelling, or pain around your site. You have fluid or blood coming from your site. Your site feels warm to the touch. You have pus or a bad smell coming from your site. You have a fever. Your sutures, skin  glue, or adhesive strips loosen or come off sooner than expected. Get help right away if: You have bleeding that does not stop with pressure or a dressing. Summary After the procedure, it is common to have some soreness, bruising, and itching at the site. Follow instructions from your health care provider about how to take care of your site. Check your site every day for signs of infection. Contact a health care provider if you have redness, swelling, or pain around your site, or your site feels warm to the touch. Keep all follow-up visits as told by your health care provider. This is important. This information is not intended to replace advice given to you by your health care provider. Make sure you discuss any questions you have with your health care provider. Document Released: 11/27/2015 Document Revised: 04/30/2018 Document Reviewed: 04/30/2018 Elsevier Interactive Patient Education  [employer].

## 2024-07-25 NOTE — Progress Notes (Signed)
 Pharmacist Chemotherapy Monitoring - Initial Assessment    Anticipated start date: 07/31/24   The following has been reviewed per standard work regarding the patient's treatment regimen: The patient's diagnosis, treatment plan and drug doses, and organ/hematologic function Lab orders and baseline tests specific to treatment regimen  The treatment plan start date, drug sequencing, and pre-medications Prior authorization status  Patient's documented medication list, including drug-drug interaction screen and prescriptions for anti-emetics and supportive care specific to the treatment regimen The drug concentrations, fluid compatibility, administration routes, and timing of the medications to be used The patient's access for treatment and lifetime cumulative dose history, if applicable  The patient's medication allergies and previous infusion related reactions, if applicable    Patient's case is complicated as evidenced by 2 cancer primaries.  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  Anal squamous cell carcinoma treatments takes priority.   Changes made to treatment plan:  N/A  Follow up needed:  N/A   Monica Gonzalez, [facility], 07/25/2024  11:56 AM

## 2024-07-25 NOTE — Interval H&P Note (Signed)
 History and Physical Interval Note:  07/25/2024 8:04 AM  Monica Gonzalez  has presented today for surgery, with the diagnosis of C50.411 Malignant neoplasm of upper outer quadrant of right breast, unspecified estrogen receptor status.  The various methods of treatment have been discussed with the patient and family. After consideration of risks, benefits and other options for treatment, the patient has consented to  Procedure(s): BREAST LUMPECTOMY (Right) as a surgical intervention.  The patient's history has been reviewed, patient examined, no change in status, stable for surgery.  I have reviewed the patient's chart and labs.  Questions were answered to the patient's satisfaction.     Jaivyn Gulla Tye

## 2024-07-25 NOTE — Telephone Encounter (Signed)
 Attempted to call patient to discuss what is needed but no answer and no voicemail.

## 2024-07-25 NOTE — Addendum Note (Signed)
 Addendum  created 07/25/24 1408 by Chesley Lendia CROME, MD   Clinical Note Signed

## 2024-07-25 NOTE — Anesthesia Postprocedure Evaluation (Signed)
 Anesthesia Post Note  Patient: Monica Gonzalez  Procedure(s) Performed: BREAST LUMPECTOMY (Right: Breast)  Patient location during evaluation: PACU Anesthesia Type: General Level of consciousness: awake and alert Pain management: pain level controlled Vital Signs Assessment: post-procedure vital signs reviewed and stable Respiratory status: spontaneous breathing, nonlabored ventilation, respiratory function stable and patient connected to nasal cannula oxygen Cardiovascular status: blood pressure returned to baseline and stable Postop Assessment: no apparent nausea or vomiting Anesthetic complications: no   No notable events documented.   Last Vitals:  Vitals:   07/25/24 1000 07/25/24 1015  BP: 127/71 118/81  Pulse: 87 85  Resp: 15 11  Temp:    SpO2: 99% 98%    Last Pain:  Vitals:   07/25/24 1001  TempSrc:   PainSc: 5                  [name]

## 2024-07-25 NOTE — Anesthesia Procedure Notes (Addendum)
 Procedure Name: Intubation Date/Time: 07/25/2024 8:38 AM  Performed by: Belinda, Summer, CRNAPre-anesthesia Checklist: Patient identified, Patient being monitored, Timeout performed, Emergency Drugs available and Suction available Patient Re-evaluated:Patient Re-evaluated prior to induction Oxygen Delivery Method: Circle system utilized Preoxygenation: Pre-oxygenation with 100% oxygen Induction Type: IV induction Ventilation: Mask ventilation without difficulty Laryngoscope Size: Mac and 3 Grade View: Grade I Tube type: Oral Tube size: 6.5 mm Number of attempts: 1 Airway Equipment and Method: Stylet Placement Confirmation: ETT inserted through vocal cords under direct vision, positive ETCO2 and breath sounds checked- equal and bilateral Secured at: 21 cm Tube secured with: Tape Dental Injury: Teeth and Oropharynx as per pre-operative assessment  Comments: Poor dentition. DL x1 with McGrath mac blade 3, grade 1 view with extra precautions taken to teeth (unchanged from preop baseline). Atraumatic intubation. - SF, CRNA

## 2024-07-25 NOTE — Anesthesia Preprocedure Evaluation (Addendum)
 Anesthesia Evaluation  Patient identified by MRN, date of birth, ID band Patient awake    Reviewed: Allergy & Precautions, NPO status , Patient's Chart, lab work & pertinent test results  History of Anesthesia Complications Negative for: history of anesthetic complications  Airway Mallampati: III  TM Distance: <3 FB Neck ROM: full    Dental  (+) Chipped, Poor Dentition, Missing   Pulmonary shortness of breath and with exertion, asthma , Recent URI , Residual Cough    + decreased breath sounds      Cardiovascular (-) angina Normal cardiovascular exam+ Valvular Problems/Murmurs      Neuro/Psych  Headaches  Neuromuscular disease  negative psych ROS   GI/Hepatic Neg liver ROS, hiatal hernia,GERD  Controlled,,  Endo/Other  negative endocrine ROS    Renal/GU      Musculoskeletal   Abdominal   Peds  Hematology  (+) Blood dyscrasia, anemia   Anesthesia Other Findings Past Medical History: No date: Anemia No date: Anginal pain (HCC) No date: Anxiety No date: Arthritis No date: Asthma No date: Cancer (HCC) No date: Depression No date: Dyspnea No date: Fibromyalgia No date: GERD (gastroesophageal reflux disease) No date: Headache No date: Heart murmur No date: History of hiatal hernia No date: History of kidney stones No date: Lupus 2025: Malignant neoplasm of upper-outer quadrant of right female  breast, unspecified estrogen receptor status (HCC) No date: Pneumonia No date: PTSD (post-traumatic stress disorder)  Past Surgical History: 06/18/2024: BREAST BIOPSY; Right     Comment:  US  RT BREAST BX W LOC DEV 1ST LESION IMG BX SPEC US                GUIDE 06/18/2024 [facility] 06/18/2024: BREAST BIOPSY; Right     Comment:  US  RT BREAST BX W LOC DEV EA ADD LESION IMG BX SPEC US                GUIDE 06/18/2024 [facility] 06/18/2024: BREAST BIOPSY; Right     Comment:  US  RT BREAST BX W LOC DEV EA ADD LESION  IMG BX SPEC US                GUIDE 06/18/2024 [facility] 07/05/2024: BREAST BIOPSY; Right     Comment:  US  RT BREAST SAVI/RF TAG 1ST LESION US  GUIDE 07/05/2024               [facility] 07/05/2024: BREAST BIOPSY; Right     Comment:  US  RT BREAST SAVI/RF TAG EA ADD'L LESION US  GUIDE               07/05/2024 [facility] 02/17/2023: COLONOSCOPY WITH PROPOFOL ; N/A     Comment:  Procedure: COLONOSCOPY WITH PROPOFOL ;  Surgeon:               Maryruth Ole DASEN, MD;  Location: [facility];                Service: Endoscopy;  Laterality: N/A; 02/17/2023: ESOPHAGOGASTRODUODENOSCOPY (EGD) WITH PROPOFOL ; N/A     Comment:  Procedure: ESOPHAGOGASTRODUODENOSCOPY (EGD) WITH               PROPOFOL ;  Surgeon: Maryruth Ole DASEN, MD;  Location:               [facility];  Service: Endoscopy;  Laterality: N/A; No date: EXTRACORPOREAL SHOCK WAVE LITHOTRIPSY 05/16/2024: RECTAL EXAM UNDER ANESTHESIA; N/A     Comment:  Procedure: EXAM UNDER ANESTHESIA, RECTUM, INCISIONAL  BIOPSY OF ANAL MASS;  Surgeon: Tye Millet, DO;                Location: [facility];  Service: General;  Laterality: N/A; No date: TONSILLECTOMY No date: TUBAL LIGATION 07/29/2020: VULVECTOMY; N/A     Comment:  Procedure: WIDE EXCISION VULVECTOMY;  Surgeon: Mancil Barter, MD;  Location: [facility];  Service: Gynecology;                Laterality: N/A;  BMI    Body Mass Index: 38.62 kg/m      Reproductive/Obstetrics negative OB ROS                              Anesthesia Physical Anesthesia Plan  ASA: 3  Anesthesia Plan: General ETT   Post-op Pain Management: Toradol  IV (intra-op)* and Ofirmev  IV (intra-op)*   Induction: Intravenous  PONV Risk Score and Plan: 3 and Ondansetron , Dexamethasone , Midazolam  and Treatment may vary due to age or medical condition  Airway Management Planned: LMA  Additional Equipment:   Intra-op Plan:   Post-operative Plan:  Extubation in OR  Informed Consent: I have reviewed the patients History and Physical, chart, labs and discussed the procedure including the risks, benefits and alternatives for the proposed anesthesia with the patient or authorized representative who has indicated his/her understanding and acceptance.     Dental Advisory Given  Plan Discussed with: Anesthesiologist, CRNA and Surgeon  Anesthesia Plan Comments: (Patient consented for risks of anesthesia including but not limited to:  - adverse reactions to medications - damage to eyes, teeth, lips or other oral mucosa - nerve damage due to positioning  - sore throat or hoarseness - Damage to heart, brain, nerves, lungs, other parts of body or loss of life  Patient voiced understanding and assent.)         Anesthesia Quick Evaluation

## 2024-07-26 ENCOUNTER — Other Ambulatory Visit: Payer: Self-pay | Admitting: *Deleted

## 2024-07-26 ENCOUNTER — Encounter: Payer: Self-pay | Admitting: Surgery

## 2024-07-26 DIAGNOSIS — C210 Malignant neoplasm of anus, unspecified: Secondary | ICD-10-CM

## 2024-07-28 ENCOUNTER — Encounter: Payer: Self-pay | Admitting: Pulmonary Disease

## 2024-07-29 ENCOUNTER — Emergency Department

## 2024-07-29 ENCOUNTER — Observation Stay
Admission: EM | Admit: 2024-07-29 | Discharge: 2024-07-30 | Disposition: A | Discharge status: Home / Self Care | Attending: Internal Medicine | Admitting: Internal Medicine

## 2024-07-29 ENCOUNTER — Other Ambulatory Visit: Payer: Self-pay

## 2024-07-29 DIAGNOSIS — J45909 Unspecified asthma, uncomplicated: Secondary | ICD-10-CM | POA: Diagnosis not present

## 2024-07-29 DIAGNOSIS — Z86718 Personal history of other venous thrombosis and embolism: Secondary | ICD-10-CM | POA: Diagnosis not present

## 2024-07-29 DIAGNOSIS — C210 Malignant neoplasm of anus, unspecified: Secondary | ICD-10-CM | POA: Diagnosis present

## 2024-07-29 DIAGNOSIS — F419 Anxiety disorder, unspecified: Secondary | ICD-10-CM | POA: Diagnosis not present

## 2024-07-29 DIAGNOSIS — I2699 Other pulmonary embolism without acute cor pulmonale: Secondary | ICD-10-CM | POA: Diagnosis not present

## 2024-07-29 DIAGNOSIS — C44520 Squamous cell carcinoma of anal skin: Secondary | ICD-10-CM | POA: Insufficient documentation

## 2024-07-29 DIAGNOSIS — G8929 Other chronic pain: Secondary | ICD-10-CM | POA: Diagnosis present

## 2024-07-29 DIAGNOSIS — M329 Systemic lupus erythematosus, unspecified: Secondary | ICD-10-CM | POA: Diagnosis present

## 2024-07-29 DIAGNOSIS — R0602 Shortness of breath: Secondary | ICD-10-CM | POA: Diagnosis present

## 2024-07-29 DIAGNOSIS — R0600 Dyspnea, unspecified: Secondary | ICD-10-CM | POA: Diagnosis not present

## 2024-07-29 DIAGNOSIS — D0590 Unspecified type of carcinoma in situ of unspecified breast: Secondary | ICD-10-CM | POA: Diagnosis not present

## 2024-07-29 DIAGNOSIS — C50919 Malignant neoplasm of unspecified site of unspecified female breast: Secondary | ICD-10-CM | POA: Diagnosis present

## 2024-07-29 LAB — RESP PANEL BY RT-PCR (RSV, FLU A&B, COVID)  RVPGX2
Influenza A by PCR: NEGATIVE
Influenza B by PCR: NEGATIVE
Resp Syncytial Virus by PCR: NEGATIVE
SARS Coronavirus 2 by RT PCR: NEGATIVE

## 2024-07-29 LAB — CBC
HCT: 42 % (ref 36.0–46.0)
Hemoglobin: 13.3 g/dL (ref 12.0–15.0)
MCH: 30 pg (ref 26.0–34.0)
MCHC: 31.7 g/dL (ref 30.0–36.0)
MCV: 94.8 fL (ref 80.0–100.0)
Platelets: 429 K/uL — ABNORMAL HIGH (ref 150–400)
RBC: 4.43 MIL/uL (ref 3.87–5.11)
RDW: 13.6 % (ref 11.5–15.5)
WBC: 13.2 K/uL — ABNORMAL HIGH (ref 4.0–10.5)
nRBC: 0 % (ref 0.0–0.2)

## 2024-07-29 LAB — BASIC METABOLIC PANEL WITH GFR
Anion gap: 10 (ref 5–15)
BUN: 7 mg/dL (ref 6–20)
CO2: 26 mmol/L (ref 22–32)
Calcium: 8.5 mg/dL — ABNORMAL LOW (ref 8.9–10.3)
Chloride: 104 mmol/L (ref 98–111)
Creatinine, Ser: 0.92 mg/dL (ref 0.44–1.00)
GFR, Estimated: >60 mL/min (ref >60)
Glucose, Bld: 85 mg/dL (ref 70–99)
Potassium: 3.5 mmol/L (ref 3.5–5.1)
Sodium: 140 mmol/L (ref 135–145)

## 2024-07-29 LAB — SURGICAL PATHOLOGY

## 2024-07-29 LAB — APTT: aPTT: 33 s (ref 24–36)

## 2024-07-29 LAB — PROTIME-INR
INR: 1 (ref 0.8–1.2)
Prothrombin Time: 13.8 s (ref 11.4–15.2)

## 2024-07-29 MED ORDER — ONDANSETRON HCL 4 MG/2ML IJ SOLN: 4.0000 mg | Freq: Four times a day (QID) | INTRAMUSCULAR | Status: DC | PRN

## 2024-07-29 MED ORDER — ALBUTEROL SULFATE (2.5 MG/3ML) 0.083% IN NEBU: 2.5000 mg | INHALATION_SOLUTION | Freq: Four times a day (QID) | RESPIRATORY_TRACT | Status: DC | PRN

## 2024-07-29 MED ORDER — ONDANSETRON HCL 4 MG PO TABS: 4.0000 mg | ORAL_TABLET | Freq: Four times a day (QID) | ORAL | Status: DC | PRN

## 2024-07-29 MED ORDER — ENOXAPARIN SODIUM 40 MG/0.4ML IJ SOSY
40.0000 mg | PREFILLED_SYRINGE | Freq: Two times a day (BID) | INTRAMUSCULAR | Status: DC
  Administered 2024-07-29: 40 mg via SUBCUTANEOUS
  Filled 2024-07-29: qty 0.4

## 2024-07-29 MED ORDER — HEPARIN (PORCINE) 25000 UT/250ML-% IV SOLN
1100.0000 [IU]/h | INTRAVENOUS | Status: DC
  Administered 2024-07-29: 1100 [IU]/h via INTRAVENOUS
  Filled 2024-07-29: qty 250

## 2024-07-29 MED ORDER — BUSPIRONE HCL 5 MG PO TABS
5.0000 mg | ORAL_TABLET | Freq: Two times a day (BID) | ORAL | Status: DC
  Administered 2024-07-30 (×2): 5 mg via ORAL
  Filled 2024-07-29 (×2): qty 1

## 2024-07-29 MED ORDER — ACETAMINOPHEN 325 MG PO TABS: 650.0000 mg | ORAL_TABLET | Freq: Four times a day (QID) | ORAL | Status: DC | PRN

## 2024-07-29 MED ORDER — IOHEXOL 350 MG/ML SOLN
75.0000 mL | Freq: Once | INTRAVENOUS | Status: AC | PRN
  Administered 2024-07-29: 75 mL via INTRAVENOUS

## 2024-07-29 MED ORDER — GABAPENTIN 300 MG PO CAPS
600.0000 mg | ORAL_CAPSULE | Freq: Two times a day (BID) | ORAL | Status: DC
  Administered 2024-07-30 (×2): 600 mg via ORAL
  Filled 2024-07-29 (×2): qty 2

## 2024-07-29 MED ORDER — MORPHINE SULFATE (PF) 2 MG/ML IV SOLN: 2.0000 mg | INTRAVENOUS | Status: DC | PRN

## 2024-07-29 MED ORDER — TRAZODONE HCL 50 MG PO TABS
50.0000 mg | ORAL_TABLET | Freq: Every day | ORAL | Status: DC
  Administered 2024-07-30: 50 mg via ORAL
  Filled 2024-07-29: qty 1

## 2024-07-29 MED ORDER — ENOXAPARIN SODIUM 100 MG/ML IJ SOSY: 1.0000 mg/kg | PREFILLED_SYRINGE | Freq: Two times a day (BID) | INTRAMUSCULAR | Status: DC

## 2024-07-29 MED ORDER — ENOXAPARIN SODIUM 40 MG/0.4ML IJ SOSY: 40.0000 mg | PREFILLED_SYRINGE | Freq: Once | INTRAMUSCULAR | Status: DC

## 2024-07-29 MED ORDER — ACETAMINOPHEN 650 MG RE SUPP: 650.0000 mg | Freq: Four times a day (QID) | RECTAL | Status: DC | PRN

## 2024-07-29 MED ORDER — HEPARIN BOLUS VIA INFUSION
2500.0000 [IU] | Freq: Once | INTRAVENOUS | Status: AC
  Administered 2024-07-29: 2500 [IU] via INTRAVENOUS
  Filled 2024-07-29: qty 2500

## 2024-07-29 MED ORDER — DULOXETINE HCL 30 MG PO CPEP
30.0000 mg | ORAL_CAPSULE | Freq: Every day | ORAL | Status: DC
  Administered 2024-07-30: 30 mg via ORAL
  Filled 2024-07-29: qty 1

## 2024-07-29 MED ORDER — GUAIFENESIN-DM 100-10 MG/5ML PO SYRP
10.0000 mL | ORAL_SOLUTION | ORAL | Status: DC | PRN
  Administered 2024-07-29 – 2024-07-30 (×3): 10 mL via ORAL
  Filled 2024-07-29 (×3): qty 10

## 2024-07-29 MED ORDER — IPRATROPIUM-ALBUTEROL 0.5-2.5 (3) MG/3ML IN SOLN
3.0000 mL | Freq: Once | RESPIRATORY_TRACT | Status: AC
  Administered 2024-07-29: 3 mL via RESPIRATORY_TRACT
  Filled 2024-07-29: qty 3

## 2024-07-29 MED ORDER — HYDROCODONE-ACETAMINOPHEN 5-325 MG PO TABS
1.0000 | ORAL_TABLET | ORAL | Status: DC | PRN
  Administered 2024-07-30: 1 via ORAL
  Filled 2024-07-29: qty 1

## 2024-07-29 NOTE — Hospital Course (Signed)
 Monica Gonzalez

## 2024-07-29 NOTE — Assessment & Plan Note (Signed)
 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

## 2024-07-29 NOTE — Assessment & Plan Note (Addendum)
 Possible acute bronchitis Multifactorial, asthma, PE, possible bronchitis Has been treated for asthma previously with 2 courses of steroids Recent normal echo 07/19/2024 Albuterol  as needed Get a respiratory viral panel Consider pulmonology inpatient consult to evaluate for other potential etiologies given symptoms present for over a month

## 2024-07-29 NOTE — Telephone Encounter (Signed)
 Reminder created for 08/22/24

## 2024-07-29 NOTE — ED Provider Notes (Signed)
 [facility] Provider Note    Event Date/Time   First MD Initiated Contact with Patient 07/29/24 1606     (approximate)   History   Shortness of Breath   HPI  Monica Gonzalez is a 48 y.o. female  Acute stomach ulcer, Anemia, Anxiety, Asthma (HHS-HCC), Bronchitis, chronic (CMS/HHS-HCC), GERD (gastroesophageal reflux disease), Heart murmur, Hiatal hernia, History of cancer, History of pneumonia, Kidney stones, Lupus, and Vulvar intraepithelial neoplasia (VIN) grade 3.     Recent diagnosis of DVT in the right internal jugular vein now on twice daily Lovenox , last dose this morning   Patient reports that she has been having shortness of breath and asthma that has been treated twice with Medrol  Dosepaks including she is on about her second Medrol  Dosepak the last couple days and a cough and a feeling of shortness of breath that is progressive for about the last 3 weeks.  No fevers or chills no runny nose no body aches  She has been using her asthma treatments including albuterol , steroids without improvement and seems to be slowly worsening  No swelling anywhere.  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 and has her first chemotherapy learning session planned for tomorrow   Physical Exam   Triage Vital Signs: ED Triage Vitals  Encounter Vitals Group     BP 07/29/24 1503 103/85     Girls Systolic BP Percentile --      Girls Diastolic BP Percentile --      Boys Systolic BP Percentile --      Boys Diastolic BP Percentile --      Pulse Rate 07/29/24 1503 95     Resp 07/29/24 1503 18     Temp 07/29/24 1503 98 F (36.7 C)     Temp src --      SpO2 07/29/24 1503 100 %     Weight 07/29/24 1502 192 lb (87.1 kg)     Height 07/29/24 1502 4' 11 (1.499 m)     Head Circumference --      Peak Flow --      Pain Score 07/29/24 1502 7     Pain Loc --      Pain Education --      Exclude from Growth Chart --      Most recent vital signs: Vitals:   07/29/24 1845 07/29/24 1936  BP: 111/62   Pulse: 72   Resp: 16   Temp:  97.8 F (36.6 C)  SpO2: 100%      General: Awake, no distress.  CV:  Good peripheral perfusion.  Normal tones and rate Resp:  Normal effort.  Clear bilateral.  No notable wheezing.  She has an occasional dry cough, but she does not have typical stigmata of obvious asthma exacerbation at this time but reports dyspnea. Abd:  No distention.  Soft nontender Other:  No lower extremity edema or venous cords or congestion   ED Results / Procedures / Treatments   Labs (all labs ordered are listed, but only abnormal results are displayed) Labs Reviewed  BASIC METABOLIC PANEL WITH GFR - Abnormal; Notable for the following components:      Result Value   Calcium 8.5 (*)    All other components within normal limits  CBC - Abnormal; Notable for the following components:   WBC 13.2 (*)    Platelets 429 (*)    All other components within normal limits  RESP PANEL BY RT-PCR (RSV, FLU A&B, COVID)  RVPGX2  APTT  PROTIME-INR  SEDIMENTATION RATE  POC URINE PREG, ED     EKG  EKG interpreted me as normal Heart rate 80 QRS 80 QTc 420 no evidence of acute ischemia   RADIOLOGY  Chest x-ray inter by me is negative for acute finding.  Discussed CT PE findings with radiology  CT Angio Chest PE W and/or Wo Contrast Addendum Date: 07/29/2024 ADDENDUM REPORT: 07/29/2024 18:47 ADDENDUM: The original report was by Dr. Ryan Salvage. The following addendum is by Dr. Ryan Salvage: Critical Value/emergent results were called by telephone at the time of interpretation on 07/29/2024 at 6:47 pm to provider Amoreena Neubert , who verbally acknowledged these results. Electronically Signed   By: Ryan Salvage M.D.   On: 07/29/2024 18:47   Result Date: 07/29/2024 CLINICAL DATA:  Deep vein thrombosis, dyspnea, high probability of pulmonary embolus. Recent diagnosis of anal squamous cell  carcinoma. * Tracking Code: BO * EXAM: CT ANGIOGRAPHY CHEST WITH CONTRAST TECHNIQUE: Multidetector CT imaging of the chest was performed using the standard protocol during bolus administration of intravenous contrast. Multiplanar CT image reconstructions and MIPs were obtained to evaluate the vascular anatomy. RADIATION DOSE REDUCTION: This exam was performed according to the departmental dose-optimization program which includes automated exposure control, adjustment of the mA and/or kV according to patient size and/or use of iterative reconstruction technique. CONTRAST:  75mL OMNIPAQUE  IOHEXOL  350 MG/ML SOLN COMPARISON:  06/30/2024 FINDINGS: Cardiovascular: Right Port-A-Cath tip: Right internal jugular vein just above the brachiocephalic confluence. Surrounding hypodensity, persistent thrombus in the right internal jugular vein not excluded. Acute subsegmental pulmonary embolus medially in the left lower lobe as on images 180-201 of series 6. No lobar or larger embolus identified. Mediastinum/Nodes: Unremarkable Lungs/Pleura: Mild subsegmental atelectasis in the right lower lobe. Upper Abdomen: Unremarkable Musculoskeletal: Unremarkable Review of the MIP images confirms the above findings. IMPRESSION: 1. Acute subsegmental pulmonary embolus medially in the left lower lobe. No lobar or larger embolus identified. 2. Right Port-A-Cath tip: Right internal jugular vein just above the brachiocephalic confluence. Surrounding hypodensity in the right internal jugular vein, persistent thrombus in the right internal jugular vein (as shown on ultrasound exam of 07/23/2024) not excluded. 3. Mild subsegmental atelectasis in the right lower lobe. Radiology assistant personnel have been notified to put me in telephone contact with the referring physician or the referring physician's clinical representative in order to discuss these findings. Once this communication is established I will issue an addendum to this report for  documentation purposes. Electronically Signed: By: Ryan Salvage M.D. On: 07/29/2024 18:38   DG Chest 2 View Result Date: 07/29/2024 CLINICAL DATA:  Shortness of breath. EXAM: CHEST - 2 VIEW COMPARISON:  07/08/2024, CT 06/30/2024 FINDINGS: Stable positioning of right chest port with tip overlying the upper SVC. Normal heart size with stable mediastinal contours. No focal airspace disease, pleural effusion, pulmonary edema or pneumothorax. No acute osseous abnormalities are seen. IMPRESSION: No acute chest findings. Electronically Signed   By: Andrea Gasman M.D.   On: 07/29/2024 16:39      PROCEDURES:  Critical Care performed: Yes, see critical care procedure note(s)   CRITICAL CARE Performed by: Oneil Budge   Total critical care time: 25 minutes  Acute pulmonary embolism with persistent dyspnea while already on anticoagulation  Critical care time was exclusive of separately billable procedures and treating other patients.  Critical care was necessary to treat or prevent imminent or life-threatening deterioration.  Critical care  was time spent personally by me on the following activities: development of treatment plan with patient and/or surrogate as well as nursing, discussions with consultants, evaluation of patient's response to treatment, examination of patient, obtaining history from patient or surrogate, ordering and performing treatments and interventions, ordering and review of laboratory studies, ordering and review of radiographic studies, pulse oximetry and re-evaluation of patient's condition.  Procedures   MEDICATIONS ORDERED IN ED: Medications  busPIRone  (BUSPAR ) tablet 5 mg (has no administration in time range)  DULoxetine  (CYMBALTA ) DR capsule 30 mg (has no administration in time range)  traZODone  (DESYREL ) tablet 50 mg (has no administration in time range)  gabapentin  (NEURONTIN ) capsule 600 mg (has no administration in time range)  albuterol  (PROVENTIL )  (2.5 MG/3ML) 0.083% nebulizer solution 2.5 mg (has no administration in time range)  acetaminophen  (TYLENOL ) tablet 650 mg (has no administration in time range)    Or  acetaminophen  (TYLENOL ) suppository 650 mg (has no administration in time range)  ondansetron  (ZOFRAN ) tablet 4 mg (has no administration in time range)    Or  ondansetron  (ZOFRAN ) injection 4 mg (has no administration in time range)  HYDROcodone -acetaminophen  (NORCO/VICODIN) 5-325 MG per tablet 1-2 tablet (has no administration in time range)  morphine  (PF) 2 MG/ML injection 2 mg (has no administration in time range)  heparin  bolus via infusion 2,500 Units (has no administration in time range)  heparin  ADULT infusion 100 units/mL (25000 units/250mL) (has no administration in time range)  ipratropium-albuterol  (DUONEB) 0.5-2.5 (3) MG/3ML nebulizer solution 3 mL (3 mLs Nebulization Given 07/29/24 1641)  iohexol  (OMNIPAQUE ) 350 MG/ML injection 75 mL (75 mLs Intravenous Contrast Given 07/29/24 1758)     IMPRESSION / MDM / ASSESSMENT AND PLAN / ED COURSE  I reviewed the triage vital signs and the nursing notes.                              Differential diagnosis includes, but is not limited to, asthma exacerbation pneumonia, or other cause but given her known DVT and persistent dyspnea I am extremely concerned about potential for thromboembolism.  Her lung sounds are clear without significant distress and I do not see clear evidence of an asthma exacerbation.  Will pursue potential pulmonary embolism.  No obvious acute cardiac symptoms.  Does not appear volume overloaded or with evidence of edema.  Chest x-ray clear no infectious symptoms noted  Patient's presentation is most consistent with acute presentation with potential threat to life or bodily function.   The patient is on the cardiac monitor to evaluate for evidence of arrhythmia and/or significant heart rate changes.  Discussed with our pharmacy team, Lovenox  appears to  be underdosed at this time.  Will increase to 80 mg twice daily, admit to hospitalist service under the care of Dr. Cleatus.  Discussed with the patient she is understanding agreeable with plan for admission.  Patient has persistent dyspnea in the setting of acute pulmonary embolism.      FINAL CLINICAL IMPRESSION(S) / ED DIAGNOSES   Final diagnoses:  Other acute pulmonary embolism without acute cor pulmonale (HCC)     Rx / DC Orders   ED Discharge Orders     None        Note:  This document was prepared using Dragon voice recognition software and may include unintentional dictation errors.   Dicky Anes, MD 07/29/24 2003

## 2024-07-29 NOTE — Consult Note (Signed)
 PHARMACY - ANTICOAGULATION CONSULT NOTE  Pharmacy Consult for Heparin  Indication: pulmonary embolus  Allergies  Allergen Reactions   Prednisone Shortness Of Breath and Swelling    Patient has taken methylprednisolone  (Depo-Medrol ) and dexamethasone  (Decadron ) without any documented side effects or adverse reactions. She describes that she used to take prednisone due to her bronchial asthma and respiratory problems, but later on she received some prednisone which into locations caused her to have significant swelling and shortness of breath. For that reason, she was told to avoid the use of prednisone.    Patient Measurements: Height: 4' 11 (149.9 cm) Weight: 87.1 kg (192 lb) IBW/kg (Calculated) : 43.2 HEPARIN  DW (KG): 63.9  Vital Signs: Temp: 97.8 F (36.6 C) (09/15 1936) Temp Source: Oral (09/15 1936) BP: 111/62 (09/15 1845) Pulse Rate: 72 (09/15 1845)  Labs: Recent Labs    07/29/24 1528  HGB 13.3  HCT 42.0  PLT 429*  CREATININE 0.92    Estimated Creatinine Clearance: 71 mL/min (by C-G formula based on SCr of 0.92 mg/dL).   Medical History: Past Medical History:  Diagnosis Date   Anemia    Anginal pain (HCC)    Anxiety    Arthritis    Asthma    Cancer (HCC)    Chronic pain syndrome 05/21/2023   DDD (degenerative disc disease), cervical 05/22/2023   Deep venous thrombosis (HCC)    Depression    Dyspnea    Fibromyalgia    GERD (gastroesophageal reflux disease)    Headache    Heart murmur    History of hiatal hernia    History of kidney stones    Lupus    Malignant neoplasm of upper-outer quadrant of right female breast, unspecified estrogen receptor status (HCC) 2025   Pneumonia    PTSD (post-traumatic stress disorder)    Skin candidiasis 05/29/2024   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 Last dose: unsure  Assessment: 48 y.o. female with medical  history significant for Lupus, family history of CAD,asthma,  palpitations, recently diagnosed with breast and rectal cancer,s/p lumpectomy on 07/25/24 not yet on treatment s/p port placement on 07/08/2024, complicated by occlusive DVT (07/23/24) right internal jugular and subclavian veins  currently on Lovenox  40 mg bid, now being admitted with an acute PE. Per patient, believes her last dose was 9/14 but says she's not sure currently.  Pharmacy has been consulted to initiate and dose continuous heparin  infusion. Baseline labs have been ordered and are pending.  Goal of Therapy:  Heparin  level 0.3-0.7 units/ml Monitor platelets by anticoagulation protocol: Yes   Plan:  Give 2500 units bolus x 1(half bolus initially d/t recent enoxaparin  40mg  administration) Start heparin  infusion at 1100 units/hr Check anti-Xa level in 6 hours and daily while on heparin  Continue to monitor H&H and platelets  Shuntel Fishburn A Selin Eisler 07/29/2024,7:48 PM

## 2024-07-29 NOTE — Telephone Encounter (Signed)
 Looks like I have openings tomorrow at 9 or 9:30 we can see her then.  We should get a chest x-ray prior to coming into the office.

## 2024-07-29 NOTE — ED Triage Notes (Addendum)
 Pt comes with c/o sob for over month now. Pt states hx of asthma. Pt states it has gotten worse and her pulmonologist told her to come here to ED.   Pt has new dx of cancer two different types. Pt has port in place but has not started chemo yet.

## 2024-07-29 NOTE — ED Notes (Signed)
Lab called to come and get blood from pt.

## 2024-07-29 NOTE — Assessment & Plan Note (Signed)
 Not acutely exacerbated Albuterol  as needed Patient s/p 2 recent courses of steroids

## 2024-07-29 NOTE — Assessment & Plan Note (Signed)
 No acute issues suspected at this time We will get sed rate

## 2024-07-29 NOTE — Assessment & Plan Note (Signed)
-   Continue home meds

## 2024-07-29 NOTE — H&P (Signed)
 History and Physical    Patient: Monica Gonzalez FMW:[id number] DOB: 08/30/1976 DOA: 07/29/2024 DOS: the patient was seen and examined on 07/29/2024 PCP: Epifanio Alm SQUIBB, MD  Patient coming from: Home  Chief Complaint:  Chief Complaint  Patient presents with   Shortness of Breath    HPI: Monica Gonzalez is a 48 y.o. female with medical history significant for Lupus, family history of CAD,asthma, , recently diagnosed with breast and rectal cancer,s/p lumpectomy on 8/25 and 07/25/24 s/p port placement on 07/08/2024, complicated by occlusive DVT (07/23/24) right internal jugular and subclavian veins  currently on Lovenox  40 mg bid, now being admitted with an acute PE.  She states her Lovenox  was interrupted for just 2 doses for her lumpectomy on 9/11, on the night prior to on the morning of surgery.  She presented with shortness of breath and chest pain.  States that for the past month she has had coughing, wheezing and short of breath, treated outpatient with inhalers and 2 courses of steroids.  Over the past few days however she developed dyspnea with exertion, chest pressure and fatigue.  She called her pulmonologist today due to concern for worsening symptoms and they recommended that she present to the ED for evaluation.  Of note, she was evaluated by cardiology around 07/03/2024 with palpitations and atypical chest pain.  Chest pain deemed atypical and she went on to have a normal echo at 07/19/2024.  She denies fever or chills.  Denies leg pain or swelling. In the ED, vitals within normal limits.  Labs including BMP, CBC and respiratory viral panel, unremarkable except for mild leukocytosis of 13,000. EKG showed NSR at 81 with no ischemic changes CTA chest PE protocol showed an acute subsegmental PE medially in the left lower lobe  Patient was given her usual dose of Lovenox  40 mg subcu and treated empirically for asthma with a DuoNeb.  Admission requested.     Review of Systems: As mentioned in  the history of present illness. All other systems reviewed and are negative.  Past Medical History:  Diagnosis Date   Anemia    Anginal pain (HCC)    Anxiety    Arthritis    Asthma    Cancer (HCC)    Chronic pain syndrome 05/21/2023   DDD (degenerative disc disease), cervical 05/22/2023   Deep venous thrombosis (HCC)    Depression    Dyspnea    Fibromyalgia    GERD (gastroesophageal reflux disease)    Headache    Heart murmur    History of hiatal hernia    History of kidney stones    Lupus    Malignant neoplasm of upper-outer quadrant of right female breast, unspecified estrogen receptor status (HCC) 2025   Pneumonia    PTSD (post-traumatic stress disorder)    Skin candidiasis 05/29/2024   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   Past Surgical History:  Procedure Laterality Date   BREAST BIOPSY Right 06/18/2024   US  RT BREAST BX W LOC DEV 1ST LESION IMG BX SPEC US  GUIDE 06/18/2024 [facility]   BREAST BIOPSY Right 06/18/2024   US  RT BREAST BX W LOC DEV EA ADD LESION IMG BX SPEC US  GUIDE 06/18/2024 [facility]   BREAST BIOPSY Right 06/18/2024   US  RT BREAST BX W LOC DEV EA ADD LESION IMG BX SPEC US  GUIDE 06/18/2024 [facility]   BREAST BIOPSY Right 07/05/2024   US  RT BREAST SAVI/RF TAG 1ST LESION US  GUIDE 07/05/2024 [facility]   BREAST BIOPSY  Right 07/05/2024   US  RT BREAST SAVI/RF TAG EA ADD'L LESION US  GUIDE 07/05/2024 [facility]   BREAST LUMPECTOMY Right 07/25/2024   Procedure: BREAST LUMPECTOMY;  Surgeon: Tye Millet, DO;  Location: [facility];  Service: General;  Laterality: Right;   BREAST LUMPECTOMY WITH RADIO FREQUENCY LOCALIZER Right 07/08/2024   Procedure: BREAST LUMPECTOMY WITH RADIO FREQUENCY LOCALIZER;  Surgeon: Tye Millet, DO;  Location: [facility];  Service: General;  Laterality: Right;   COLONOSCOPY WITH PROPOFOL  N/A 02/17/2023   Procedure: COLONOSCOPY WITH PROPOFOL ;  Surgeon: Maryruth Ole DASEN, MD;  Location: [facility];  Service: Endoscopy;  Laterality: N/A;   ESOPHAGOGASTRODUODENOSCOPY (EGD) WITH PROPOFOL  N/A 02/17/2023   Procedure: ESOPHAGOGASTRODUODENOSCOPY (EGD) WITH PROPOFOL ;  Surgeon: Maryruth Ole DASEN, MD;  Location: [facility];  Service: Endoscopy;  Laterality: N/A;   EXTRACORPOREAL SHOCK WAVE LITHOTRIPSY     PORTACATH PLACEMENT Right 07/08/2024   Procedure: INSERTION, TUNNELED CENTRAL VENOUS DEVICE, WITH PORT;  Surgeon: Tye Millet, DO;  Location: [facility];  Service: General;  Laterality: Right;   RECTAL EXAM UNDER ANESTHESIA N/A 05/16/2024   Procedure: EXAM UNDER ANESTHESIA, RECTUM, INCISIONAL BIOPSY OF ANAL MASS;  Surgeon: Tye Millet, DO;  Location: [facility];  Service: General;  Laterality: N/A;   SENTINEL NODE BIOPSY Right 07/08/2024   Procedure: BIOPSY, LYMPH NODE, SENTINEL;  Surgeon: Tye Millet, DO;  Location: [facility];  Service: General;  Laterality: Right;   TONSILLECTOMY     TUBAL LIGATION     VULVECTOMY N/A 07/29/2020   Procedure: WIDE EXCISION VULVECTOMY;  Surgeon: Mancil Barter, MD;  Location: [facility];  Service: Gynecology;  Laterality: N/A;   Social History:  reports that she has never smoked. She has never used smokeless tobacco. She reports that she does not currently use alcohol. She reports that she does not use drugs.  Allergies  Allergen Reactions   Prednisone Shortness Of Breath and Swelling    Patient has taken methylprednisolone  (Depo-Medrol ) and dexamethasone  (Decadron ) without any documented side effects or adverse reactions. She describes that she used to take prednisone due to her bronchial asthma and respiratory problems, but later on she received some prednisone which into locations caused her to have significant swelling and shortness of breath. For that reason, she was told to avoid the use of prednisone.    Family History  Problem Relation Age of Onset   Diabetes Mother    Congestive Heart Failure Mother    Lupus Maternal Grandmother      Prior to Admission medications   Medication Sig Start Date End Date Taking? Authorizing Provider  acetaminophen  (TYLENOL ) 325 MG tablet Take 2 tablets (650 mg total) by mouth every 8 (eight) hours as needed for mild pain (pain score 1-3). 07/08/24 08/07/24  Sakai, Isami, DO  albuterol  (PROVENTIL ) (2.5 MG/3ML) 0.083% nebulizer solution Take 2.5 mg by nebulization every 6 (six) hours as needed for shortness of breath or wheezing. 12/21/22 05/16/27  [provider]  albuterol  (VENTOLIN  HFA) 108 (90 Base) MCG/ACT inhaler Inhale 2 puffs into the lungs every 4 (four) hours as needed for wheezing or shortness of breath. 07/01/24   Clarine Ozell LABOR, MD  ascorbic acid (VITAMIN C) 500 MG tablet Take 500 mg by mouth daily.    [provider]  B Complex-C (SUPER B COMPLEX PO) Take by mouth daily.    [provider]  benzonatate  (TESSALON ) 200 MG capsule Take 1 capsule (200 mg total) by mouth 3 (three) times daily as needed for cough. 07/04/24   Tamea Saunas  L, MD  BIOTIN PO Take by mouth daily.    [provider]  busPIRone  (BUSPAR ) 5 MG tablet Take 5 mg by mouth 2 (two) times daily. 01/31/24 01/30/25  [provider]  cholecalciferol (VITAMIN D3) 25 MCG (1000 UNIT) tablet Take 1,000 Units by mouth daily.    [provider]  clonazePAM (KLONOPIN) 0.5 MG tablet Take 0.5 mg by mouth at bedtime as needed (anxiety/sleep). 04/05/23   [provider]  DULoxetine  (CYMBALTA ) 30 MG capsule Take 30 mg by mouth at bedtime. 05/10/24 05/10/25  [provider]  enoxaparin  (LOVENOX ) 40 MG/0.4ML injection Inject 0.4 mLs (40 mg total) into the skin every 12 (twelve) hours. 07/23/24   Dasie Tinnie MATSU, NP  ferrous sulfate 325 (65 FE) MG tablet Take 325 mg by mouth daily.  07/01/20 05/16/27  [provider]  gabapentin  (NEURONTIN ) 300 MG capsule Take 600 mg by mouth 2 (two) times daily. 04/14/23 05/16/27  [provider]  ipratropium (ATROVENT  HFA) 17  MCG/ACT inhaler Inhale 2 puffs into the lungs every 6 (six) hours as needed for wheezing (COUGH). 07/04/24 07/04/25  Tamea Dedra CROME, MD  lidocaine  (LIDODERM ) 5 % 1 patch daily. 06/13/24   [provider]  methylPREDNISolone  (MEDROL  DOSEPAK) 4 MG TBPK tablet Take as directed in the package, this is a taper pack. 07/22/24   Tamea Dedra CROME, MD  Multiple Vitamin (MULTIVITAMIN WITH MINERALS) TABS tablet Take 1 tablet by mouth daily.    [provider]  naproxen  (EC NAPROSYN ) 500 MG EC tablet Take 1 tablet (500 mg total) by mouth 2 (two) times daily with a meal. 06/07/24   Evans, Alexandra, PA-C  nystatin  (MYCOSTATIN /NYSTOP ) powder Apply 1 Application topically 2 (two) times daily. 05/29/24   Babara Call, MD  pantoprazole (PROTONIX) 40 MG tablet Take by mouth. 11/04/22 07/24/24  [provider]  promethazine -dextromethorphan  (PROMETHAZINE -DM) 6.25-15 MG/5ML syrup Take 5 mLs by mouth 4 (four) times daily as needed for cough. 07/22/24   Tamea Dedra CROME, MD  traMADol  (ULTRAM ) 50 MG tablet Take 1 tablet (50 mg total) by mouth every 8 (eight) hours as needed. 07/25/24 07/25/25  Tye, Isami, DO  traZODone  (DESYREL ) 50 MG tablet Take 50 mg by mouth at bedtime. 07/04/23 07/24/24  [provider]  Turmeric (QC TUMERIC COMPLEX PO) Take by mouth daily.    [provider]    Physical Exam: Vitals:   07/29/24 1700 07/29/24 1730 07/29/24 1845 07/29/24 1936  BP: 124/62 106/80 111/62   Pulse: 88 83 72   Resp:   16   Temp:    97.8 F (36.6 C)  TempSrc:    Oral  SpO2: 97% 97% 100%   Weight:      Height:       Physical Exam Vitals and nursing note reviewed.  Constitutional:      General: She is not in acute distress.    Comments: Patient with a persistent dry cough  HENT:     Head: Normocephalic and atraumatic.  Cardiovascular:     Rate and Rhythm: Normal rate and regular rhythm.     Heart sounds: Normal heart sounds.  Pulmonary:     Effort: Pulmonary effort is  normal.     Breath sounds: Normal breath sounds.  Abdominal:     Palpations: Abdomen is soft.     Tenderness: There is no abdominal tenderness.  Neurological:     Mental Status: Mental status is at baseline.     Labs on Admission: I  have personally reviewed following labs and imaging studies  CBC: Recent Labs  Lab 07/29/24 1528  WBC 13.2*  HGB 13.3  HCT 42.0  MCV 94.8  PLT 429*   Basic Metabolic Panel: Recent Labs  Lab 07/29/24 1528  NA 140  K 3.5  CL 104  CO2 26  GLUCOSE 85  BUN 7  CREATININE 0.92  CALCIUM 8.5*   GFR: Estimated Creatinine Clearance: 71 mL/min (by C-G formula based on SCr of 0.92 mg/dL). Liver Function Tests: No results for input(s): AST, ALT, ALKPHOS, BILITOT, PROT, ALBUMIN in the last 168 hours. No results for input(s): LIPASE, AMYLASE in the last 168 hours. No results for input(s): AMMONIA in the last 168 hours. Coagulation Profile: No results for input(s): INR, PROTIME in the last 168 hours. Cardiac Enzymes: No results for input(s): CKTOTAL, CKMB, CKMBINDEX, TROPONINI in the last 168 hours. BNP (last 3 results) No results for input(s): PROBNP in the last 8760 hours. HbA1C: No results for input(s): HGBA1C in the last 72 hours. CBG: No results for input(s): GLUCAP in the last 168 hours. Lipid Profile: No results for input(s): CHOL, HDL, LDLCALC, TRIG, CHOLHDL, LDLDIRECT in the last 72 hours. Thyroid  Function Tests: No results for input(s): TSH, T4TOTAL, FREET4, T3FREE, THYROIDAB in the last 72 hours. Anemia Panel: No results for input(s): VITAMINB12, FOLATE, FERRITIN, TIBC, IRON, RETICCTPCT in the last 72 hours. Urine analysis:    Component Value Date/Time   COLORURINE STRAW (A) 04/15/2022 1247   APPEARANCEUR CLEAR (A) 04/15/2022 1247   APPEARANCEUR Clear 03/06/2015 1450   LABSPEC 1.003 (L) 04/15/2022 1247   LABSPEC 1.012 03/06/2015 1450   PHURINE 6.0 04/15/2022  1247   GLUCOSEU NEGATIVE 04/15/2022 1247   GLUCOSEU Negative 03/06/2015 1450   HGBUR LARGE (A) 04/15/2022 1247   BILIRUBINUR NEGATIVE 04/15/2022 1247   BILIRUBINUR Negative 03/06/2015 1450   KETONESUR NEGATIVE 04/15/2022 1247   PROTEINUR NEGATIVE 04/15/2022 1247   NITRITE NEGATIVE 04/15/2022 1247   LEUKOCYTESUR NEGATIVE 04/15/2022 1247   LEUKOCYTESUR Negative 03/06/2015 1450    Radiological Exams on Admission: CT Angio Chest PE W and/or Wo Contrast Addendum Date: 07/29/2024 ADDENDUM REPORT: 07/29/2024 18:47 ADDENDUM: The original report was by Dr. Ryan Salvage. The following addendum is by Dr. Ryan Salvage: Critical Value/emergent results were called by telephone at the time of interpretation on 07/29/2024 at 6:47 pm to provider MARK QUALE , who verbally acknowledged these results. Electronically Signed   By: Ryan Salvage M.D.   On: 07/29/2024 18:47   Result Date: 07/29/2024 CLINICAL DATA:  Deep vein thrombosis, dyspnea, high probability of pulmonary embolus. Recent diagnosis of anal squamous cell carcinoma. * Tracking Code: BO * EXAM: CT ANGIOGRAPHY CHEST WITH CONTRAST TECHNIQUE: Multidetector CT imaging of the chest was performed using the standard protocol during bolus administration of intravenous contrast. Multiplanar CT image reconstructions and MIPs were obtained to evaluate the vascular anatomy. RADIATION DOSE REDUCTION: This exam was performed according to the departmental dose-optimization program which includes automated exposure control, adjustment of the mA and/or kV according to patient size and/or use of iterative reconstruction technique. CONTRAST:  75mL OMNIPAQUE  IOHEXOL  350 MG/ML SOLN COMPARISON:  06/30/2024 FINDINGS: Cardiovascular: Right Port-A-Cath tip: Right internal jugular vein just above the brachiocephalic confluence. Surrounding hypodensity, persistent thrombus in the right internal jugular vein not excluded. Acute subsegmental pulmonary embolus medially in  the left lower lobe as on images 180-201 of series 6. No lobar or larger embolus identified. Mediastinum/Nodes: Unremarkable Lungs/Pleura: Mild subsegmental atelectasis in the right lower lobe. Upper  Abdomen: Unremarkable Musculoskeletal: Unremarkable Review of the MIP images confirms the above findings. IMPRESSION: 1. Acute subsegmental pulmonary embolus medially in the left lower lobe. No lobar or larger embolus identified. 2. Right Port-A-Cath tip: Right internal jugular vein just above the brachiocephalic confluence. Surrounding hypodensity in the right internal jugular vein, persistent thrombus in the right internal jugular vein (as shown on ultrasound exam of 07/23/2024) not excluded. 3. Mild subsegmental atelectasis in the right lower lobe. Radiology assistant personnel have been notified to put me in telephone contact with the referring physician or the referring physician's clinical representative in order to discuss these findings. Once this communication is established I will issue an addendum to this report for documentation purposes. Electronically Signed: By: Ryan Salvage M.D. On: 07/29/2024 18:38   DG Chest 2 View Result Date: 07/29/2024 CLINICAL DATA:  Shortness of breath. EXAM: CHEST - 2 VIEW COMPARISON:  07/08/2024, CT 06/30/2024 FINDINGS: Stable positioning of right chest port with tip overlying the upper SVC. Normal heart size with stable mediastinal contours. No focal airspace disease, pleural effusion, pulmonary edema or pneumothorax. No acute osseous abnormalities are seen. IMPRESSION: No acute chest findings. Electronically Signed   By: Andrea Gasman M.D.   On: 07/29/2024 16:39   Data Reviewed for HPI: Relevant notes from primary care and specialist visits, past discharge summaries as available in EHR, including Care Everywhere. 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, RIJ, right subclavian in the setting of port placement 07/08/2024 Provoked PE suspected with patient with high thromboembolic risk due to cancer(likely provoked by port as well as break in Lovenox  therapy for lumpectomy 07/25/2024) Will initiate heparin  with plans to transition back to Lovenox , due to continued dyspnea, for what it is worth Can consider pulmonary consult  Acute dyspnea Possible acute bronchitis Multifactorial, asthma, PE, possible bronchitis Has been treated for asthma previously with 2 courses of steroids Recent normal echo 07/19/2024 Albuterol  as needed Get a respiratory viral panel Consider pulmonology inpatient consult to evaluate for other potential etiologies given symptoms present for over a month  Asthma Not acutely exacerbated Albuterol  as needed Patient s/p 2 recent courses of steroids  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 Observation status is judged to be reasonable and necessary in order to provide the required intensity of service to ensure the patient's safety. The patient's presenting  symptoms, physical exam findings, and initial radiographic and laboratory data in the context of their medical condition is felt to place them at decreased risk for further clinical deterioration. Furthermore, it is anticipated that the patient will be medically stable for discharge from the hospital  within 2 midnights of admission.   Author: Delayne LULLA Solian, MD 07/29/2024 7:39 PM  For on call review [email or web address].

## 2024-07-29 NOTE — Assessment & Plan Note (Signed)
 Continue buspirone  duloxetine  and trazodone 

## 2024-07-29 NOTE — Assessment & Plan Note (Signed)
 Invasive breast cancer s/p lumpectomy 9/11 Being followed by oncology, chemo not yet started

## 2024-07-30 ENCOUNTER — Other Ambulatory Visit (HOSPITAL_COMMUNITY): Payer: Self-pay

## 2024-07-30 ENCOUNTER — Other Ambulatory Visit: Payer: Self-pay | Admitting: Oncology

## 2024-07-30 ENCOUNTER — Inpatient Hospital Stay

## 2024-07-30 ENCOUNTER — Encounter: Payer: Self-pay | Admitting: Internal Medicine

## 2024-07-30 ENCOUNTER — Encounter: Payer: Self-pay | Admitting: Oncology

## 2024-07-30 ENCOUNTER — Telehealth (HOSPITAL_COMMUNITY): Payer: Self-pay | Admitting: Pharmacy Technician

## 2024-07-30 DIAGNOSIS — I2699 Other pulmonary embolism without acute cor pulmonale: Secondary | ICD-10-CM

## 2024-07-30 DIAGNOSIS — C210 Malignant neoplasm of anus, unspecified: Secondary | ICD-10-CM

## 2024-07-30 LAB — RESPIRATORY PANEL BY PCR

## 2024-07-30 LAB — CBC
HCT: 41.2 % (ref 36.0–46.0)
Hemoglobin: 13.3 g/dL (ref 12.0–15.0)
MCH: 30.2 pg (ref 26.0–34.0)
MCHC: 32.3 g/dL (ref 30.0–36.0)
MCV: 93.4 fL (ref 80.0–100.0)
Platelets: 459 K/uL — ABNORMAL HIGH (ref 150–400)
RBC: 4.41 MIL/uL (ref 3.87–5.11)
RDW: 13.6 % (ref 11.5–15.5)
WBC: 16.4 K/uL — ABNORMAL HIGH (ref 4.0–10.5)
nRBC: 0 % (ref 0.0–0.2)

## 2024-07-30 LAB — HEPARIN LEVEL (UNFRACTIONATED)
Heparin Unfractionated: >1.1 [IU]/mL — ABNORMAL HIGH (ref 0.30–0.70)
Heparin Unfractionated: 0.35 [IU]/mL (ref 0.30–0.70)

## 2024-07-30 LAB — SEDIMENTATION RATE: Sed Rate: 10 mm/h (ref 0–20)

## 2024-07-30 MED ORDER — HEPARIN (PORCINE) 25000 UT/250ML-% IV SOLN
900.0000 [IU]/h | INTRAVENOUS | Status: DC
  Administered 2024-07-30: 900 [IU]/h via INTRAVENOUS

## 2024-07-30 MED ORDER — PROCHLORPERAZINE MALEATE 10 MG PO TABS: 10.0000 mg | ORAL_TABLET | Freq: Four times a day (QID) | ORAL | 1 refills | Status: DC | PRN

## 2024-07-30 MED ORDER — GUAIFENESIN ER 600 MG PO TB12
600.0000 mg | ORAL_TABLET | Freq: Two times a day (BID) | ORAL | Status: DC
Start: 2024-07-30 — End: 2024-07-30
  Administered 2024-07-30: 600 mg via ORAL
  Filled 2024-07-30: qty 1

## 2024-07-30 MED ORDER — ENOXAPARIN SODIUM 40 MG/0.4ML IJ SOSY: 1.0000 mg/kg | PREFILLED_SYRINGE | Freq: Two times a day (BID) | INTRAMUSCULAR | 0 refills | Status: DC

## 2024-07-30 MED ORDER — LIDOCAINE-PRILOCAINE 2.5-2.5 % EX CREA: TOPICAL_CREAM | CUTANEOUS | 3 refills | Status: DC

## 2024-07-30 MED ORDER — ONDANSETRON HCL 8 MG PO TABS: 8.0000 mg | ORAL_TABLET | Freq: Three times a day (TID) | ORAL | 1 refills | Status: DC | PRN

## 2024-07-30 NOTE — Consult Note (Signed)
 PHARMACY - ANTICOAGULATION CONSULT NOTE  Pharmacy Consult for Heparin  Indication: pulmonary embolus  Allergies  Allergen Reactions   Prednisone Shortness Of Breath and Swelling    Patient has taken methylprednisolone  (Depo-Medrol ) and dexamethasone  (Decadron ) without any documented side effects or adverse reactions. She describes that she used to take prednisone due to her bronchial asthma and respiratory problems, but later on she received some prednisone which into locations caused her to have significant swelling and shortness of breath. For that reason, she was told to avoid the use of prednisone.    Patient Measurements: Height: 4' 11 (149.9 cm) Weight: 87.1 kg (192 lb) IBW/kg (Calculated) : 43.2 HEPARIN  DW (KG): 63.9  Vital Signs: Temp: 97.9 F (36.6 C) (09/16 0020) Temp Source: Oral (09/16 0020) BP: 118/73 (09/16 0030) Pulse Rate: 80 (09/16 0030)  Labs: Recent Labs    07/29/24 1528 07/29/24 1956 07/30/24 0330  HGB 13.3  --  13.3  HCT 42.0  --  41.2  PLT 429*  --  459*  APTT  --  33  --   LABPROT  --  13.8  --   INR  --  1.0  --   HEPARINUNFRC  --   --  >1.10*  CREATININE 0.92  --   --     Estimated Creatinine Clearance: 71 mL/min (by C-G formula based on SCr of 0.92 mg/dL).   Medical History: Past Medical History:  Diagnosis Date   Anemia    Anginal pain (HCC)    Anxiety    Arthritis    Asthma    Cancer (HCC)    Chronic pain syndrome 05/21/2023   DDD (degenerative disc disease), cervical 05/22/2023   Deep venous thrombosis (HCC)    Depression    Dyspnea    Fibromyalgia    GERD (gastroesophageal reflux disease)    Headache    Heart murmur    History of hiatal hernia    History of kidney stones    Lupus    Malignant neoplasm of upper-outer quadrant of right female breast, unspecified estrogen receptor status (HCC) 2025   Pneumonia    PTSD (post-traumatic stress disorder)    Skin candidiasis 05/29/2024   VIN III (vulvar intraepithelial  neoplasia III) 07/15/2020    Medications:  Enoxaparin  40mg  twice daily for occlusive DVT found on 9/9. Last dose: unsure  Assessment: 48 y.o. female with medical history significant for Lupus, family history of CAD,asthma,  palpitations, recently diagnosed with breast and rectal cancer,s/p lumpectomy on 07/25/24 not yet on treatment s/p port placement on 07/08/2024, complicated by occlusive DVT (07/23/24) right internal jugular and subclavian veins  currently on Lovenox  40 mg bid, now being admitted with an acute PE. Per patient, believes her last dose was 9/14 but says she's not sure currently.  Pharmacy has been consulted to initiate and dose continuous heparin  infusion. Baseline labs have been ordered and are pending.  Goal of Therapy:  Heparin  level 0.3-0.7 units/ml Monitor platelets by anticoagulation protocol: Yes  0916 0330 HL >1.1, supratherapeutic   Plan:  Probation officer.  Hold infusion for 1 hour Restart heparin  infusion at 900 units/hr Check anti-Xa level in 6 hours after restart Continue to monitor H&H and platelets  Rankin CANDIE Dills, PharmD, [facility] 07/30/2024 4:14 AM

## 2024-07-30 NOTE — Telephone Encounter (Signed)
 Yes I reviewed her chart this morning it looks like she had clot at the end of her Port-A-Cath as well.  This is not uncommon to see in patients with cancer unfortunately.  Her on 17 August did not show a PE so this is very acute.

## 2024-07-30 NOTE — Discharge Summary (Signed)
 Physician Discharge Summary   Patient: Monica Gonzalez MRN: [id number] DOB: [birth year]  Admit date:     07/29/2024  Discharge date: 07/30/24  Discharge Physician: Delon Herald   PCP: Epifanio Alm SQUIBB, MD   Recommendations at discharge:   Take Lovenox  90 mg twice daily and do not miss doses Follow up tomorrow AM for oncology teaching followed by first chemotherapy treatment Follow up with Dr. 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 Lovenox  was interrupted for 2 doses on 9/11 to allow for lumpectomy.  With her lupus and malignancies, she is at very high risk for recurrent VTE event.  Per oncology, she will discharge on Lovenox  1 mg/kg BID.  She has oncology follow up scheduled for tomorrow AM.   Assessment and Plan:  Acute subsegmental pulmonary embolism; history of DVT 07/23/2024, RIJ, right subclavian in the setting of port placement 07/08/2024 Current anticoagulation with Lovenox  but she missed 2 doses due to recent lumpectomy revision Provoked PE suspected with patient with high thromboembolic risk due to cancer + SLE Initiated heparin  Will transition back to Lovenox  1 mg/kg BID per oncology Recommend heparin  bridge if any additional procedures are needed in the near future   Acute dyspnea Multifactorial - asthma, PE, possible bronchitis Has been treated for asthma previously  with 2 courses of steroids Recent normal echo 07/19/2024 Albuterol  as needed   Asthma Not acutely exacerbated Albuterol  as needed Patient s/p 2 recent courses of steroids   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 and patient was instructed, not to drive, operate heavy machinery, perform activities at heights, swimming or participation in water  activities or provide baby-sitting services while on Pain, Sleep and Anxiety Medications; until their outpatient Physician has advised to do so again. Also recommended to not to take more than prescribed Pain, Sleep and Anxiety Medications.   Disposition: Home Diet recommendation:  Regular diet DISCHARGE MEDICATION: Allergies as of 07/30/2024       Reactions   Prednisone Shortness Of Breath, Swelling   Patient has taken methylprednisolone  (Depo-Medrol ) and dexamethasone  (Decadron ) without any documented side effects or adverse reactions. She describes that she used to take prednisone due to her bronchial asthma and respiratory problems, but later on she received some prednisone which into locations caused her to have significant swelling and shortness of  breath. For that reason, she was told to avoid the use of prednisone.        Medication List     PAUSE taking these medications    naproxen  500 MG EC  tablet Wait to take this until your doctor or other care provider tells you to start again. Commonly known as: EC NAPROSYN  Take 1 tablet (500 mg total) by mouth 2 (two) times daily with a meal.       STOP taking these medications    methylPREDNISolone  4 MG Tbpk tablet Commonly known as: MEDROL  DOSEPAK       TAKE these medications    acetaminophen  325 MG tablet Commonly known as: Tylenol  Take 2 tablets (650 mg total) by mouth every 8 (eight) hours as needed for mild pain (pain score 1-3).   albuterol  (2.5 MG/3ML) 0.083% nebulizer solution Commonly known as: PROVENTIL  Take 2.5 mg by nebulization every 6 (six) hours as needed for shortness of breath or wheezing.   albuterol  108 (90 Base) MCG/ACT inhaler Commonly known as: VENTOLIN  HFA Inhale 2 puffs into the lungs every 4 (four) hours as needed for wheezing or shortness of breath.   ascorbic acid 500 MG tablet Commonly known as: VITAMIN C Take 500 mg by mouth daily.   benzonatate  200 MG capsule Commonly known as: TESSALON  Take 1 capsule (200 mg total) by mouth 3 (three) times daily as needed for cough.   BIOTIN PO Take by mouth daily.   busPIRone  5 MG tablet Commonly known as: BUSPAR  Take 5 mg by mouth 2 (two) times daily.   cholecalciferol 25 MCG (1000 UNIT) tablet Commonly known as: VITAMIN D3 Take 1,000 Units by mouth daily.   clonazePAM 0.5 MG tablet Commonly known as: KLONOPIN Take 0.5 mg by mouth at bedtime as needed (anxiety/sleep).   DULoxetine  30 MG capsule Commonly known as: CYMBALTA  Take 30 mg by mouth at bedtime.   enoxaparin  40 MG/0.4ML injection Commonly known as: LOVENOX  Inject 0.875 mLs (87.5 mg total) into the skin every 12 (twelve) hours. What changed: how much to take   ferrous sulfate 325 (65 FE) MG tablet Take 325 mg by mouth daily.   gabapentin  300 MG capsule Commonly known as: NEURONTIN  Take 600 mg by mouth 2 (two) times daily.   ipratropium 17 MCG/ACT inhaler Commonly known  as: ATROVENT  HFA Inhale 2 puffs into the lungs every 6 (six) hours as needed for wheezing (COUGH).   lidocaine  5 % Commonly known as: LIDODERM  1 patch daily.   lidocaine -prilocaine  cream Commonly known as: EMLA  Apply to affected area once   Linzess 145 MCG Caps capsule Generic drug: linaclotide Take 145 mcg by mouth daily before breakfast.   multivitamin with minerals Tabs tablet Take 1 tablet by mouth daily.   nystatin  powder Commonly known as: MYCOSTATIN /NYSTOP  Apply 1 Application topically 2 (two) times daily.   ondansetron  8 MG tablet Commonly known as: Zofran  Take 1 tablet (8 mg total) by mouth every 8 (eight) hours as needed for nausea or vomiting.   pantoprazole 40 MG tablet Commonly known as: PROTONIX Take by mouth.   prochlorperazine  10 MG tablet Commonly known as: COMPAZINE  Take 1 tablet (10 mg total) by mouth every 6 (six) hours as needed for nausea or vomiting.   promethazine -dextromethorphan  6.25-15 MG/5ML syrup Commonly known as: PROMETHAZINE -DM Take 5 mLs by mouth 4 (four) times daily as needed for cough.   QC TUMERIC COMPLEX PO Take by mouth daily.   SUPER B COMPLEX PO Take by mouth daily.  traMADol  50 MG tablet Commonly known as: Ultram  Take 1 tablet (50 mg total) by mouth every 8 (eight) hours as needed.   traZODone  50 MG tablet Commonly known as: DESYREL  Take 50 mg by mouth at bedtime.        Discharge Exam:   Subjective: Still having cough, SOB is somewhat better, not needing O2.   Objective: Vitals:   07/30/24 1100 07/30/24 1131  BP: 119/77   Pulse: 78   Resp: 16   Temp:  98 F (36.7 C)  SpO2: 100%    No intake or output data in the 24 hours ending 07/30/24 1347 Filed Weights   07/29/24 1502  Weight: 87.1 kg    Exam:  General:  Appears calm and comfortable and is in NAD, on RA; periodic shallow cough Eyes:  normal lids, iris ENT:  grossly normal hearing, lips & tongue, mmm; artificial dentition Cardiovascular:   RRR. No LE edema.  Respiratory:   CTA bilaterally with no wheezes/rales/rhonchi.  Normal respiratory effort. Abdomen:  soft, NT, ND Skin:  no rash or induration seen on limited exam Musculoskeletal:  grossly normal tone BUE/BLE, good ROM, no bony abnormality Psychiatric: blunted mood and affect, speech fluent and appropriate, AOx3 Neurologic:  CN 2-12 grossly intact, moves all extremities in coordinated fashion  Data Reviewed: I have reviewed the patient's lab results since admission.  Pertinent labs for today include:   WBC 16.4     Condition at discharge: fair  The results of significant diagnostics from this hospitalization (including imaging, microbiology, ancillary and laboratory) are listed below for reference.   Imaging Studies: CT Angio Chest PE W and/or Wo Contrast Addendum Date: 07/29/2024 ADDENDUM REPORT: 07/29/2024 18:47 ADDENDUM: The original report was by Dr. Ryan Salvage. The following addendum is by Dr. Ryan Salvage: Critical Value/emergent results were called by telephone at the time of interpretation on 07/29/2024 at 6:47 pm to provider MARK QUALE , who verbally acknowledged these results. Electronically Signed   By: Ryan Salvage M.D.   On: 07/29/2024 18:47   Result Date: 07/29/2024 CLINICAL DATA:  Deep vein thrombosis, dyspnea, high probability of pulmonary embolus. Recent diagnosis of anal squamous cell carcinoma. * Tracking Code: BO * EXAM: CT ANGIOGRAPHY CHEST WITH CONTRAST TECHNIQUE: Multidetector CT imaging of the chest was performed using the standard protocol during bolus administration of intravenous contrast. Multiplanar CT image reconstructions and MIPs were obtained to evaluate the vascular anatomy. RADIATION DOSE REDUCTION: This exam was performed according to the departmental dose-optimization program which includes automated exposure control, adjustment of the mA and/or kV according to patient size and/or use of iterative reconstruction  technique. CONTRAST:  75mL OMNIPAQUE  IOHEXOL  350 MG/ML SOLN COMPARISON:  06/30/2024 FINDINGS: Cardiovascular: Right Port-A-Cath tip: Right internal jugular vein just above the brachiocephalic confluence. Surrounding hypodensity, persistent thrombus in the right internal jugular vein not excluded. Acute subsegmental pulmonary embolus medially in the left lower lobe as on images 180-201 of series 6. No lobar or larger embolus identified. Mediastinum/Nodes: Unremarkable Lungs/Pleura: Mild subsegmental atelectasis in the right lower lobe. Upper Abdomen: Unremarkable Musculoskeletal: Unremarkable Review of the MIP images confirms the above findings. IMPRESSION: 1. Acute subsegmental pulmonary embolus medially in the left lower lobe. No lobar or larger embolus identified. 2. Right Port-A-Cath tip: Right internal jugular vein just above the brachiocephalic confluence. Surrounding hypodensity in the right internal jugular vein, persistent thrombus in the right internal jugular vein (as shown on ultrasound exam of 07/23/2024) not excluded. 3. Mild subsegmental atelectasis in the right lower  lobe. Radiology assistant personnel have been notified to put me in telephone contact with the referring physician or the referring physician's clinical representative in order to discuss these findings. Once this communication is established I will issue an addendum to this report for documentation purposes. Electronically Signed: By: Ryan Salvage M.D. On: 07/29/2024 18:38   DG Chest 2 View Result Date: 07/29/2024 CLINICAL DATA:  Shortness of breath. EXAM: CHEST - 2 VIEW COMPARISON:  07/08/2024, CT 06/30/2024 FINDINGS: Stable positioning of right chest port with tip overlying the upper SVC. Normal heart size with stable mediastinal contours. No focal airspace disease, pleural effusion, pulmonary edema or pneumothorax. No acute osseous abnormalities are seen. IMPRESSION: No acute chest findings. Electronically Signed   By: Andrea Gasman M.D.   On: 07/29/2024 16:39   US  Venous Img Upper Uni Right Result Date: 07/23/2024 CLINICAL DATA:  Right neck pain at Port-A-Cath site EXAM: Right UPPER EXTREMITY VENOUS DOPPLER ULTRASOUND TECHNIQUE: Gray-scale sonography with graded compression, as well as color Doppler and duplex ultrasound were performed to evaluate the upper extremity deep venous system from the level of the subclavian vein and including the jugular, axillary, basilic, radial, ulnar and upper cephalic vein. Spectral Doppler was utilized to evaluate flow at rest and with distal augmentation maneuvers. COMPARISON:  None Available. FINDINGS: Contralateral Subclavian Vein: Respiratory phasicity is normal and symmetric with the symptomatic side. No evidence of thrombus. Normal compressibility. Internal Jugular Vein: Positive for acute occlusive thrombus. Incompletely compressible Subclavian Vein: Positive for acute occlusive thrombus. Axillary Vein: No evidence of thrombus. Normal compressibility, respiratory phasicity and response to augmentation. Cephalic Vein: No evidence of thrombus. Normal compressibility, respiratory phasicity and response to augmentation. Basilic Vein: No evidence of thrombus. Normal compressibility, respiratory phasicity and response to augmentation. Brachial Veins: No evidence of thrombus. Normal compressibility, respiratory phasicity and response to augmentation. Radial Veins: No evidence of thrombus. Normal compressibility, respiratory phasicity and response to augmentation. Ulnar Veins: No evidence of thrombus. Normal compressibility, respiratory phasicity and response to augmentation. IMPRESSION: Positive for acute occlusive DVT involving the right internal jugular and subclavian veins. Critical Value/emergent results were called by telephone at the time of interpretation on 07/23/2024 at 3:38 pm to provider TINNIE DAWN , who verbally acknowledged these results. Electronically Signed   By: Luke Bun M.D.    On: 07/23/2024 15:39   DG CHEST PORT 1 VIEW Result Date: 07/08/2024 CLINICAL DATA:  [id number] Port-A-Cath in place [id number] EXAM: PORTABLE CHEST 1 VIEW COMPARISON:  06/30/2024. FINDINGS: Right chest Port-A-Cath tip overlies the upper SVC. No pneumothorax. Low lung volumes with accentuated interstitial markings. Streaky opacities at the right lung base, favored to reflect atelectasis, although infiltrate cannot be excluded. No pleural effusion. The heart size and mediastinal contours are unchanged. Visualized osseous structures are unchanged. IMPRESSION: 1. Right chest Port-A-Cath tip overlies the upper SVC. No pneumothorax. 2. Low lung volumes with streaky opacities at the right lung base, favored to reflect atelectasis, although infiltrate cannot be excluded. Electronically Signed   By: Harrietta Sherry M.D.   On: 07/08/2024 14:28   DG C-Arm 1-60 Min-No Report Result Date: 07/08/2024 Fluoroscopy was utilized by the requesting physician.  No radiographic interpretation.   MM Breast Surgical Specimen Result Date: 07/08/2024 CLINICAL DATA:  Status post [facility] localized RIGHT breast lumpectomy. Patient is status post ultrasound-guided biopsy and subsequent SAVI scout localization of 2 sites of biopsy-proven malignancy at the heart and COIL clip. History of recently diagnosed metastatic anal cancer. EXAM: SPECIMEN RADIOGRAPH OF THE  RIGHT BREAST COMPARISON:  Previous exam(s). FINDINGS: Status post excision of the RIGHT breast. The 2 Savi Scout reflectors, COIL shaped biopsy marking clip and heart shaped clip are present within the specimen. IMPRESSION: Specimen radiograph of the RIGHT breast. Electronically Signed   By: Corean Salter M.D.   On: 07/08/2024 11:37   NM Sentinel Node Inj-No Rpt (Breast) Result Date: 07/08/2024 Lymphoseek was injected by the Nuclear Medicine Technologist for sentinel lymph node localization.   MM 3D DIAGNOSTIC MAMMOGRAM UNILATERAL RIGHT BREAST Result Date:  07/05/2024 CLINICAL DATA:  Status post same day SAVI scout localization under ultrasound guidance x2 EXAM: DIGITAL DIAGNOSTIC UNILATERAL RIGHT MAMMOGRAM WITH TOMOSYNTHESIS AND CAD TECHNIQUE: Right digital diagnostic mammography and breast tomosynthesis was performed. The images were evaluated with computer-aided detection. COMPARISON:  Previous exam(s). ACR Breast Density Category c: The breasts are heterogeneously dense, which may obscure small masses. FINDINGS: Post localization mammogram demonstrates appropriate positioning of SAVI scout reflectors at the site biopsy-proven IDC (heart clip) and biopsy-proven IDC (coil clip). Postprocedural changes from lidocaine  injection are noted surround both prior biopsy sites. A Venus clip is noted in the LATERAL breast, corresponding with site of benign biopsy. IMPRESSION: Successful SAVI scout localization of both sites of biopsy-proven IDC in the RIGHT breast. Images were marked for Dr. Tye. RECOMMENDATION: Treatment plan per surgical/oncology team. I have discussed the findings and recommendations with the patient. If applicable, a reminder letter will be sent to the patient regarding the next appointment. BI-RADS CATEGORY  6: Known biopsy-proven malignancy. Electronically Signed   By: Norleen Croak M.D.   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 FINDINGS: Patient presents for needle localization prior to localization for RIGHT breast lumpectomy for 2 sites of biopsy-proven IDC. I met with the patient and we discussed the procedure of needle localization including benefits and alternatives. We discussed the high likelihood of a successful procedure. We discussed the risks of the procedure,  including infection, bleeding, tissue injury, and further surgery. Informed, written consent was given. The usual time-out protocol was performed immediately prior to the procedure. Site 1: Mass at 12 o'clock, 8 cm from the nipple (coil clip) Using ultrasound guidance, sterile technique, 1% lidocaine  and a 10 cm SAVI SCOUT needle, the mass at 12 o'clock 8 cm from the nipple was localized using a LATERAL approach. The images were marked for Dr. Tye. Site 2: Mass at 12 o'clock, 12 cm from the nipple (heart clip) Using ultrasound guidance, sterile technique, 1% lidocaine  and a 10 cm SAVI SCOUT needle, the mass at 12 o'clock 12 cm from the nipple was localized using a LATERAL approach. The images were marked for Dr. Tye. IMPRESSION: Radar reflector localization of the RIGHT breast x2. No apparent complications. Electronically Signed   By: Norleen Croak M.D.   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 FINDINGS: Patient presents for needle localization prior to localization for RIGHT breast lumpectomy for 2 sites of biopsy-proven IDC. I met with the patient and we discussed the  procedure of needle localization including benefits and alternatives. We discussed the high likelihood of a successful procedure. We discussed the risks of the procedure, including infection, bleeding, tissue injury, and further surgery. Informed, written consent was given. The usual time-out protocol was performed immediately prior to the procedure. Site 1: Mass at 12 o'clock, 8 cm from the nipple (coil clip) Using ultrasound guidance, sterile technique, 1% lidocaine  and a 10 cm SAVI SCOUT needle, the mass at 12 o'clock 8 cm from the nipple was localized using a  LATERAL approach. The images were marked for Dr. Tye. Site 2: Mass at 12 o'clock, 12 cm from the nipple (heart clip) Using ultrasound guidance, sterile technique, 1% lidocaine  and a 10 cm SAVI SCOUT needle, the mass at 12 o'clock 12 cm from the nipple was localized using a LATERAL approach. The images were marked for Dr. Tye. IMPRESSION: Radar reflector localization of the RIGHT breast x2. No apparent complications. Electronically Signed   By: Norleen Croak M.D.   On: 07/05/2024 08:03   NM PET Image Initial (PI) Whole Body Result Date: 07/01/2024 CLINICAL DATA:  Initial treatment strategy for anal squamous cell carcinoma. EXAM: NUCLEAR MEDICINE PET WHOLE BODY TECHNIQUE: 10.3 mCi F-18 FDG was injected intravenously. Full-ring PET imaging was performed from the head to foot after the radiotracer. CT data was obtained and used for attenuation correction and anatomic localization. Fasting blood glucose: 172 mg/dl COMPARISON:  CT chest 91/82/7974, 06/05/2024 and MR abdomen 11/29/2022. FINDINGS: Mediastinal blood pool activity: SUV max 3.2 HEAD/NECK: No abnormal hypermetabolism. Mild asymmetric left nasopharyngeal hypermetabolism without a CT correlate. Incidental CT findings: None. CHEST: Hypermetabolic 11 mm right breast nodule, SUV max 6.1. No additional abnormal hypermetabolism. Incidental CT findings: Heart is enlarged.  No pericardial or pleural effusion. ABDOMEN/PELVIS: Intense anorectal hypermetabolism, SUV max 12.9. Hypermetabolic left inguinal lymph node measures 2.0 cm (6/164), SUV max 9.1. No additional abnormal hypermetabolism. Incidental CT findings: 2.8 cm left adnexal cyst. No follow-up imaging recommended. Note: This recommendation does not apply to premenarchal patients and to those with increased risk (genetic, family history, elevated tumor markers or other high-risk factors) of ovarian cancer. Reference: JACR 2020 Feb; 17(2):248-254 SKELETON: No abnormal hypermetabolism. Incidental CT findings:  Minimal degenerative change in the spine. EXTREMITIES: No abnormal hypermetabolism. Incidental CT findings: None. IMPRESSION: 1. Hypermetabolic anal mass with hypermetabolic left inguinal adenopathy, compatible with anorectal carcinoma. 2. Hypermetabolic right breast nodule with recent biopsies on 06/18/2024. Please correlate with pathology report. Electronically Signed   By: Newell Eke M.D.   On: 07/01/2024 11:31   CT Angio Chest PE W/Cm &/Or Wo Cm Result Date: 06/30/2024 CLINICAL DATA:  Pulmonary embolism suspected. Chest tightness and shortness of breath and cough. History of asthma. Albuterol  inhaler not providing relief EXAM: CT ANGIOGRAPHY CHEST WITH CONTRAST TECHNIQUE: Multidetector CT imaging of the chest was performed using the standard protocol during bolus administration of intravenous contrast. Multiplanar CT image reconstructions and MIPs were obtained to evaluate the vascular anatomy. RADIATION DOSE REDUCTION: This exam was performed according to the departmental dose-optimization program which includes automated exposure control, adjustment of the mA and/or kV according to patient size and/or use of iterative reconstruction technique. CONTRAST:  75mL OMNIPAQUE  IOHEXOL  350 MG/ML SOLN COMPARISON:  Same day chest radiograph and CT 06/05/2024 FINDINGS: Cardiovascular: Negative for pulmonary embolism. Normal caliber thoracic aorta without dissection. No pericardial effusion. Mediastinum/Nodes: Trachea and esophagus are unremarkable. No pathologic adenopathy. Lungs/Pleura: No focal consolidation, pleural effusion, or pneumothorax. The airways are patent. Upper Abdomen: No acute  abnormality. Musculoskeletal: No acute fracture. Review of the MIP images confirms the above findings. IMPRESSION: Negative for pulmonary embolism. No acute abnormality in the chest. Electronically Signed   By: Norman Gatlin M.D.   On: 06/30/2024 20:57   DG Chest 2 View Result Date: 06/30/2024 CLINICAL DATA:  Chest  tightness and shortness of breath and cough EXAM: CHEST - 2 VIEW COMPARISON:  06/28/2023 FINDINGS: Stable cardiomediastinal silhouette. Low lung volumes accentuate interstitial markings. No focal consolidation, pleural effusion, or pneumothorax. No displaced rib fractures. IMPRESSION: No active cardiopulmonary disease. Electronically Signed   By: Norman Gatlin M.D.   On: 06/30/2024 20:16    Microbiology: Results for orders placed or performed during the hospital encounter of 07/29/24  Resp panel by RT-PCR (RSV, Flu A&B, Covid) Anterior Nasal Swab     Status: None   Collection Time: 07/29/24  4:38 PM   Specimen: Anterior Nasal Swab  Result Value Ref Range Status   SARS Coronavirus 2 by RT PCR NEGATIVE NEGATIVE Final    Comment: (NOTE) SARS-CoV-2 target nucleic acids are NOT DETECTED.  The SARS-CoV-2 RNA is generally detectable in upper respiratory specimens during the acute phase of infection. The lowest concentration of SARS-CoV-2 viral copies this assay can detect is 138 copies/mL. A negative result does not preclude SARS-Cov-2 infection and should not be used as the sole basis for treatment or other patient management decisions. A negative result may occur with  improper specimen collection/handling, submission of specimen other than nasopharyngeal swab, presence of viral mutation(s) within the areas targeted by this assay, and inadequate number of viral copies(<138 copies/mL). A negative result must be combined with clinical observations, patient history, and epidemiological information. The expected result is Negative.  Fact Sheet for Patients:  BloggerCourse.com  Fact Sheet for Healthcare Providers:  SeriousBroker.it  This test is no t yet approved or cleared by the [place]  FDA and  has been authorized for detection and/or diagnosis of SARS-CoV-2 by FDA under an Emergency Use Authorization (EUA). This EUA will remain  in  effect (meaning this test can be used) for the duration of the COVID-19 declaration under Section 564(b)(1) of the Act, 21 U.S.C.section 360bbb-3(b)(1), unless the authorization is terminated  or revoked sooner.       Influenza A by PCR NEGATIVE NEGATIVE Final   Influenza B by PCR NEGATIVE NEGATIVE Final    Comment: (NOTE) The Xpert Xpress SARS-CoV-2/FLU/RSV plus assay is intended as an aid in the diagnosis of influenza from Nasopharyngeal swab specimens and should not be used as a sole basis for treatment. Nasal washings and aspirates are unacceptable for Xpert Xpress SARS-CoV-2/FLU/RSV testing.  Fact Sheet for Patients: BloggerCourse.com  Fact Sheet for Healthcare Providers: SeriousBroker.it  This test is not yet approved or cleared by the [place]  FDA and has been authorized for detection and/or diagnosis of SARS-CoV-2 by FDA under an Emergency Use Authorization (EUA). This EUA will remain in effect (meaning this test can be used) for the duration of the COVID-19 declaration under Section 564(b)(1) of the Act, 21 U.S.C. section 360bbb-3(b)(1), unless the authorization is terminated or revoked.     Resp Syncytial Virus by PCR NEGATIVE NEGATIVE Final    Comment: (NOTE) Fact Sheet for Patients: BloggerCourse.com  Fact Sheet for Healthcare Providers: SeriousBroker.it  This test is not yet approved or cleared by the [place]  FDA and has been authorized for detection and/or diagnosis of SARS-CoV-2 by FDA under an Emergency Use Authorization (EUA). This EUA will remain in effect (meaning  this test can be used) for the duration of the COVID-19 declaration under Section 564(b)(1) of the Act, 21 U.S.C. section 360bbb-3(b)(1), unless the authorization is terminated or revoked.  Performed at [facility], [address] [place], [place] [address]    Respiratory (~20 pathogens) panel by PCR     Status: None   Collection Time: 07/30/24  6:58 AM   Specimen: Nasopharyngeal Swab; Respiratory  Result Value Ref Range Status   Adenovirus NOT DETECTED NOT DETECTED Final   Coronavirus 229E NOT DETECTED NOT DETECTED Final    Comment: (NOTE) The Coronavirus on the Respiratory Panel, DOES NOT test for the novel  Coronavirus (2019 nCoV)    Coronavirus HKU1 NOT DETECTED NOT DETECTED Final   Coronavirus NL63 NOT DETECTED NOT DETECTED Final   Coronavirus OC43 NOT DETECTED NOT DETECTED Final   Metapneumovirus NOT DETECTED NOT DETECTED Final   Rhinovirus / Enterovirus NOT DETECTED NOT DETECTED Final   Influenza A NOT DETECTED NOT DETECTED Final   Influenza B NOT DETECTED NOT DETECTED Final   Parainfluenza Virus 1 NOT DETECTED NOT DETECTED Final   Parainfluenza Virus 2 NOT DETECTED NOT DETECTED Final   Parainfluenza Virus 3 NOT DETECTED NOT DETECTED Final   Parainfluenza Virus 4 NOT DETECTED NOT DETECTED Final   Respiratory Syncytial Virus NOT DETECTED NOT DETECTED Final   Bordetella pertussis NOT DETECTED NOT DETECTED Final   Bordetella Parapertussis NOT DETECTED NOT DETECTED Final   Chlamydophila pneumoniae NOT DETECTED NOT DETECTED Final   Mycoplasma pneumoniae NOT DETECTED NOT DETECTED Final    Comment: Performed at [facility], [address], [place], [place] [address]    Labs: CBC: Recent Labs  Lab 07/29/24 1528 07/30/24 0330  WBC 13.2* 16.4*  HGB 13.3 13.3  HCT 42.0 41.2  MCV 94.8 93.4  PLT 429* 459*   Basic Metabolic Panel: Recent Labs  Lab 07/29/24 1528  NA 140  K 3.5  CL 104  CO2 26  GLUCOSE 85  BUN 7  CREATININE 0.92  CALCIUM 8.5*   Liver Function Tests: No results for input(s): AST, ALT, ALKPHOS, BILITOT, PROT, ALBUMIN in the last 168 hours. CBG: No results for input(s): GLUCAP in the last 168 hours.  Discharge time spent: greater than 30 minutes.  Signed: Delon Herald, MD [facility] 07/30/2024

## 2024-07-30 NOTE — Consult Note (Signed)
 PHARMACY - ANTICOAGULATION CONSULT NOTE  Pharmacy Consult for Heparin  Indication: pulmonary embolus  Allergies  Allergen Reactions   Prednisone Shortness Of Breath and Swelling    Patient has taken methylprednisolone  (Depo-Medrol ) and dexamethasone  (Decadron ) without any documented side effects or adverse reactions. She describes that she used to take prednisone due to her bronchial asthma and respiratory problems, but later on she received some prednisone which into locations caused her to have significant swelling and shortness of breath. For that reason, she was told to avoid the use of prednisone.    Patient Measurements: Height: 4' 11 (149.9 cm) Weight: 87.1 kg (192 lb) IBW/kg (Calculated) : 43.2 HEPARIN  DW (KG): 63.9  Vital Signs: Temp: 98 F (36.7 C) (09/16 1131) Temp Source: Oral (09/16 1131) BP: 119/77 (09/16 1100) Pulse Rate: 78 (09/16 1100)  Labs: Recent Labs    07/29/24 1528 07/29/24 1956 07/30/24 0330 07/30/24 1142  HGB 13.3  --  13.3  --   HCT 42.0  --  41.2  --   PLT 429*  --  459*  --   APTT  --  33  --   --   LABPROT  --  13.8  --   --   INR  --  1.0  --   --   HEPARINUNFRC  --   --  >1.10* 0.35  CREATININE 0.92  --   --   --     Estimated Creatinine Clearance: 71 mL/min (by C-G formula based on SCr of 0.92 mg/dL).   Medical History: Past Medical History:  Diagnosis Date   Anemia    Anginal pain (HCC)    Anxiety    Arthritis    Asthma    Cancer (HCC)    Chronic pain syndrome 05/21/2023   DDD (degenerative disc disease), cervical 05/22/2023   Deep venous thrombosis (HCC)    Depression    Dyspnea    Fibromyalgia    GERD (gastroesophageal reflux disease)    Headache    Heart murmur    History of hiatal hernia    History of kidney stones    Lupus    Malignant neoplasm of upper-outer quadrant of right female breast, unspecified estrogen receptor status (HCC) 2025   Pneumonia    PTSD (post-traumatic stress disorder)    Skin candidiasis  05/29/2024   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 Last dose: unsure  Assessment: 48 y.o. female with medical history significant for Lupus, family history of CAD,asthma,  palpitations, recently diagnosed with breast and rectal cancer,s/p lumpectomy on 07/25/24 not yet on treatment s/p port placement on 07/08/2024, complicated by occlusive DVT (07/23/24) right internal jugular and subclavian veins  currently on Lovenox  40 mg bid, now being admitted with an acute PE. Per patient, believes her last dose was 9/14 but says she's not sure currently.  Pharmacy has been consulted to initiate and dose continuous heparin  infusion. Baseline labs have been ordered and are pending.  0916 0330 HL >1.1, supratherapeutic 9/16 1142 HL 0.35  Goal of Therapy:  Heparin  level 0.3-0.7 units/ml Monitor platelets by anticoagulation protocol: Yes    Plan:  Heparin  level is therapeutic. Will continue heparin  infusion at 900 units/hr. Recheck heparin  level in 6 hours. CBC daily while on heparin .   Cathaleen Blanch, PharmD, 07/30/2024 12:12 PM

## 2024-07-30 NOTE — Telephone Encounter (Signed)
Patient Product/process development scientist completed.    The patient is insured through [facility]. Patient has [employer], may use a copay card, and/or apply for patient assistance if available.    Ran test claim for Eliquis 5 mg and the current 30 day co-pay is $0.00.  Ran test claim for Xarelto 20 mg and the current 30 day co-pay is $0.00.  This test claim was processed through [facility]- copay amounts may vary at other pharmacies due to pharmacy/plan contracts, or as the patient moves through the different stages of their insurance plan.     Roland Earl, CPHT Pharmacy Technician III Certified Patient Advocate [facility] Patient Advocate Team Direct Number: [phone]  Fax: [phone]

## 2024-07-31 ENCOUNTER — Inpatient Hospital Stay

## 2024-07-31 ENCOUNTER — Inpatient Hospital Stay (HOSPITAL_BASED_OUTPATIENT_CLINIC_OR_DEPARTMENT_OTHER): Admitting: Oncology

## 2024-07-31 ENCOUNTER — Ambulatory Visit
Admission: RE | Admit: 2024-07-31 | Discharge: 2024-07-31 | Disposition: A | Discharge status: Home / Self Care | Source: Ambulatory Visit | Attending: Radiation Oncology | Admitting: Radiation Oncology

## 2024-07-31 ENCOUNTER — Encounter: Payer: Self-pay | Admitting: Oncology

## 2024-07-31 VITALS — BP 106/72 | HR 100 | Temp 98.1°F | Resp 16 | Wt 200.0 lb

## 2024-07-31 VITALS — BP 112/63 | HR 81

## 2024-07-31 DIAGNOSIS — I82621 Acute embolism and thrombosis of deep veins of right upper extremity: Secondary | ICD-10-CM

## 2024-07-31 DIAGNOSIS — I82A11 Acute embolism and thrombosis of right axillary vein: Secondary | ICD-10-CM | POA: Diagnosis not present

## 2024-07-31 DIAGNOSIS — I82C11 Acute embolism and thrombosis of right internal jugular vein: Secondary | ICD-10-CM | POA: Insufficient documentation

## 2024-07-31 DIAGNOSIS — K5900 Constipation, unspecified: Secondary | ICD-10-CM | POA: Insufficient documentation

## 2024-07-31 DIAGNOSIS — E876 Hypokalemia: Secondary | ICD-10-CM | POA: Insufficient documentation

## 2024-07-31 DIAGNOSIS — C210 Malignant neoplasm of anus, unspecified: Secondary | ICD-10-CM

## 2024-07-31 DIAGNOSIS — C50919 Malignant neoplasm of unspecified site of unspecified female breast: Secondary | ICD-10-CM

## 2024-07-31 DIAGNOSIS — R7309 Other abnormal glucose: Secondary | ICD-10-CM

## 2024-07-31 DIAGNOSIS — I2699 Other pulmonary embolism without acute cor pulmonale: Secondary | ICD-10-CM

## 2024-07-31 DIAGNOSIS — J45909 Unspecified asthma, uncomplicated: Secondary | ICD-10-CM

## 2024-07-31 LAB — CBC WITH DIFFERENTIAL (CANCER CENTER ONLY)
Abs Immature Granulocytes: 0.16 K/uL — ABNORMAL HIGH (ref 0.00–0.07)
Basophils Absolute: 0.1 K/uL (ref 0.0–0.1)
Basophils Relative: 1 %
Eosinophils Absolute: 0.6 K/uL — ABNORMAL HIGH (ref 0.0–0.5)
Eosinophils Relative: 5 %
HCT: 40.2 % (ref 36.0–46.0)
Hemoglobin: 12.9 g/dL (ref 12.0–15.0)
Immature Granulocytes: 1 %
Lymphocytes Relative: 26 %
Lymphs Abs: 3.1 K/uL (ref 0.7–4.0)
MCH: 29.9 pg (ref 26.0–34.0)
MCHC: 32.1 g/dL (ref 30.0–36.0)
MCV: 93.1 fL (ref 80.0–100.0)
Monocytes Absolute: 1 K/uL (ref 0.1–1.0)
Monocytes Relative: 8 %
Neutro Abs: 7.1 K/uL (ref 1.7–7.7)
Neutrophils Relative %: 59 %
Platelet Count: 384 K/uL (ref 150–400)
RBC: 4.32 MIL/uL (ref 3.87–5.11)
RDW: 13.8 % (ref 11.5–15.5)
WBC Count: 12.1 K/uL — ABNORMAL HIGH (ref 4.0–10.5)
nRBC: 0 % (ref 0.0–0.2)

## 2024-07-31 LAB — CMP (CANCER CENTER ONLY)
ALT: 18 U/L (ref 0–44)
AST: 23 U/L (ref 15–41)
Albumin: 3.2 g/dL — ABNORMAL LOW (ref 3.5–5.0)
Alkaline Phosphatase: 74 U/L (ref 38–126)
Anion gap: 9 (ref 5–15)
BUN: 9 mg/dL (ref 6–20)
CO2: 24 mmol/L (ref 22–32)
Calcium: 8.4 mg/dL — ABNORMAL LOW (ref 8.9–10.3)
Chloride: 102 mmol/L (ref 98–111)
Creatinine: 0.91 mg/dL (ref 0.44–1.00)
GFR, Estimated: >60 mL/min (ref >60)
Glucose, Bld: 121 mg/dL — ABNORMAL HIGH (ref 70–99)
Potassium: 3.3 mmol/L — ABNORMAL LOW (ref 3.5–5.1)
Sodium: 135 mmol/L (ref 135–145)
Total Bilirubin: 0.8 mg/dL (ref 0.0–1.2)
Total Protein: 6.2 g/dL — ABNORMAL LOW (ref 6.5–8.1)

## 2024-07-31 LAB — HEMOGLOBIN A1C
Hgb A1c MFr Bld: 4.7 % — ABNORMAL LOW (ref 4.8–5.6)
Mean Plasma Glucose: 88.19 mg/dL

## 2024-07-31 MED ORDER — MITOMYCIN CHEMO IV INJECTION 20 MG
10.0000 mg/m2 | Freq: Once | INTRAVENOUS | Status: AC
  Administered 2024-07-31: 19 mg via INTRAVENOUS
  Filled 2024-07-31: qty 38

## 2024-07-31 MED ORDER — PROCHLORPERAZINE MALEATE 10 MG PO TABS
10.0000 mg | ORAL_TABLET | Freq: Once | ORAL | Status: AC
  Administered 2024-07-31: 10 mg via ORAL
  Filled 2024-07-31: qty 1

## 2024-07-31 MED ORDER — SODIUM CHLORIDE 0.9 % IV SOLN: 1000.0000 mg/m2/d | INTRAVENOUS | Status: DC

## 2024-07-31 MED ORDER — POTASSIUM CHLORIDE CRYS ER 20 MEQ PO TBCR: 20.0000 meq | EXTENDED_RELEASE_TABLET | Freq: Every day | ORAL | 0 refills | Status: DC

## 2024-07-31 MED ORDER — PROMETHAZINE-DM 6.25-15 MG/5ML PO SYRP: 5.0000 mL | ORAL_SOLUTION | Freq: Four times a day (QID) | ORAL | 0 refills | Status: DC | PRN

## 2024-07-31 MED ORDER — SODIUM CHLORIDE 0.9 % IV SOLN
1000.0000 mg/m2/d | INTRAVENOUS | Status: DC
  Filled 2024-07-31: qty 140

## 2024-07-31 MED ORDER — MONTELUKAST SODIUM 10 MG PO TABS: 10.0000 mg | ORAL_TABLET | ORAL | 0 refills | Status: AC

## 2024-07-31 MED ORDER — SODIUM CHLORIDE 0.9 % IV SOLN
INTRAVENOUS | Status: DC
  Filled 2024-07-31: qty 250

## 2024-07-31 MED ORDER — SODIUM CHLORIDE 0.9 % IV SOLN
736.0000 mg/m2/d | INTRAVENOUS | Status: DC
  Administered 2024-07-31: 7000 mg via INTRAVENOUS
  Filled 2024-07-31: qty 140

## 2024-07-31 NOTE — Progress Notes (Signed)
 Per Dr. Babara, ok to use port as long as it flushes and aspirates properly.

## 2024-07-31 NOTE — Addendum Note (Signed)
 Addended by: BABARA CALL on: 07/31/2024 08:22 PM   Modules accepted: Orders

## 2024-07-31 NOTE — Assessment & Plan Note (Signed)
 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 This is likely associated with her Mediport. Recommend patient to take Lovenox  1 mg/kg subcu every 12 hours.-She is currently on 80 mg twice daily, I will obtain anti-Xa level later this week

## 2024-07-31 NOTE — Assessment & Plan Note (Signed)
 Recommend patient to take potassium levels and take milliequivalent daily for 3 days.

## 2024-07-31 NOTE — Assessment & Plan Note (Addendum)
 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 mT1c [id number] Initial margin was positive and patient underwent reexcision and achieved a negative margin.  Patient's case is complicated as evidenced by 2 cancer primaries.  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 Plan to have proceed with concurrent chemotherapy and radiation treatment for Stage III anal SCC first.  After that she will need to have adjuvant chemotherapy for triple negative breast cancer.  She will need genetic testing.  Will discuss in the future.

## 2024-07-31 NOTE — Progress Notes (Signed)
 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  Per IR, right inguinal lymph node is not enlarged.  Recommend concurrent chemotherapy 5-FU and mitomycin  C with Radiation. She has finished surgical resection of breast cancer. Labs reviewed and discussed with patient.  Proceed with day one 5-FU/Mitomycin -C.  She is started radiation today.  Rationale and potential side effects were reviewed and discussed with patient.   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 mT1c pN67mi Initial margin was positive and patient underwent reexcision and achieved a negative margin.  Patient's case is complicated as evidenced by 2 cancer primaries.  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 Plan to have proceed with concurrent chemotherapy and radiation treatment for Stage III anal SCC first.  After that she will need to have adjuvant chemotherapy for triple negative breast cancer.  She will need genetic testing.  Will discuss in the future.  Acute deep vein thrombosis (DVT) of axillary vein of right upper extremity (HCC) acute occlusive DVT involving the right internal jugular and subclavian veins. This is likely associated with her Mediport. Recommend patient to take Lovenox  1 mg/kg subcu every 12 hours.-She is currently on 80 mg twice daily, I will obtain anti-Xa level later this week  Acute subsegmental pulmonary embolism (HCC) This is not a Lovenox  failure.  Likely a combination of holding  Lovenox  due to additional breast reexcision.  Acute bronchitis.  And initial subtherapeutic dosage of Lovenox . Continue Lovenox  1 mg/kg twice daily.  Asthma Cough, possible bronchitis.  Continue inhalers.  Add trial of singular.  Reviewed cough suppressant.  Recommend patient to follow-up with pulmonology.  Hypokalemia Recommend patient to take potassium levels and take milliequivalent daily for 3 days.  Constipation Discussed about bowel regimen with Colace 100 mg 1-2 times daily, MiraLAX  daily as needed.    Orders Placed This Encounter  Procedures   CBC with Differential (Cancer Center Only)    Standing Status:   Future    Expected Date:   08/07/2024    Expiration Date:   11/05/2024   CMP (Cancer Center only)    Standing Status:   Future    Expected Date:   08/07/2024    Expiration Date:   11/05/2024   Miscellaneous [facility] test (send-out)    Standing Status:   Future    Expected Date:   08/02/2024    Expiration Date:   07/31/2025    Test name / description::   Heparin  Anti-Xa Test #[id number]   Follow-up in 1 to 2 weeks. All questions were answered. The patient knows to call the clinic with any problems, questions or concerns.  [name], MD, PhD [facility] Hematology Oncology 07/31/2024    HISTORY OF PRESENTING ILLNESS:  Monica Gonzalez 48 y.o. female presents to establish care for anal squamous cell carcinoma I have reviewed her chart and materials related to her cancer extensively and collaborated history with the patient. Summary of oncologic history is as follows: Oncology History  Anal squamous cell carcinoma (HCC)  05/29/2024 Initial Diagnosis   Anal squamous cell carcinoma  Patient has noticed a hard mass at the anal area which she initially thought was a hemorrhoid.  The mass has been present for more than a month and appears to be enlarging. Patient was referred to establish care with surgeon Dr. Tye  05/16/2024, incisional biopsy of anal mass showed 1. Anus,  biopsy, mass :       - INVASIVE MODERATELY DIFFERENTIATED SQUAMOUS CELL CARCINOMA  06/19/2024, left inguinal adenopathy biopsy showed Metastatic moderately differentiated squamous cell carcinoma.  '    05/29/2024 Cancer Staging   Staging form: Anus, AJCC V9 - Clinical stage from 05/29/2024: Stage IIIA (cT3, cN1a, cM0) - Signed by Babara Call, MD on 06/26/2024 Stage prefix: Initial diagnosis   06/05/2024 Imaging   CT chest abdomen pelvis with contrast  Slight thickening in the area of the anal canal. Please correlate for history of neoplasm. There is a pathologic enlarged left inguinal node. There is a borderline right-sided node.   No additional areas of abnormal nodal enlargement or other aggressive appearing mass lesion at this time.   Fatty liver infiltration with known segment 7 hepatic hemangioma.   Of note there is a right-sided upper breast mass which was not clearly seen on prior CT scan. Please correlate for any prior study or if needed diagnostic mammographic evaluation and possible ultrasound when appropriate.     06/12/2024 Imaging   CT chest abdomen pelvis with contrast showed Slight thickening in the area of the anal canal. Please correlate for history of neoplasm. There is a pathologic enlarged left inguinal node. There is a borderline right-sided node.   No additional areas of abnormal nodal enlargement or other aggressive appearing mass lesion at this time.   Fatty liver infiltration with known segment 7 hepatic hemangioma.   Of note there is a right-sided upper breast mass which was not clearly seen on prior CT scan. Please correlate for any prior study or if needed diagnostic mammographic evaluation and possible ultrasound when appropriate.   06/19/2024 Procedure   1. Lymph node, biopsy, left inguinal adenopathy :       -  METASTATIC MODERATELY DIFFERENTIATED SQUAMOUS CELL CARCINOMA    07/01/2024 Imaging   PET scan showed 1. Hypermetabolic anal mass with  hypermetabolic left inguinal adenopathy, compatible with anorectal carcinoma. 2. Hypermetabolic right breast nodule with recent biopsies on 06/18/2024. Please correlate with pathology report.   07/31/2024 -  Chemotherapy   Patient is on Treatment Plan : ANUS Mitomycin  D1,28 + 5FU D1-4, 28-31 q32d     Invasive carcinoma of breast (HCC)  06/12/2024 Imaging   CT chest abdomen pelvis with contrast showed Slight thickening in the area of the anal canal. Please correlate for history of neoplasm. There is a pathologic enlarged left inguinal node. There is a borderline right-sided node.   No additional areas of abnormal nodal enlargement or other aggressive appearing mass lesion at this time.   Fatty liver infiltration with known segment 7 hepatic hemangioma.   Of note there is a right-sided upper breast mass which was not clearly seen on prior CT scan. Please correlate for any prior study or if needed diagnostic mammographic evaluation and possible ultrasound when appropriate.   06/17/2024 Mammogram   1. There is a suspicious 17 mm mass at the site of CT and mammographic concern in the RIGHT breast. It demonstrates associated pleomorphic calcifications. Recommend ultrasound-guided biopsy for definitive characterization. 2. There is incidental sonographic note of a 7 mm mass in the RIGHT breast at 12 o'clock and a  6 mm non mass area at 9 o'clock. Recommend ultrasound-guided biopsy of these 2 areas for definitive characterization given suspicious appearance of the dominant mass. 3. No suspicious RIGHT axillary adenopathy. 4. No mammographic evidence of malignancy in the LEFT breast.   06/18/2024 Cancer Staging   Staging form: Breast, AJCC 8th Edition - Clinical stage from 06/18/2024: Stage IB (cT1b, cN0, cM0, G3, ER-, PR-, HER2-) - Signed by Babara Call, MD on 06/26/2024 Stage prefix: Initial diagnosis Histologic grading system: 3 grade system   06/18/2024 Initial Diagnosis   Invasive carcinoma  of breast [facility])  Patient with right breast biopsy.  Pathology showed 1. Breast, right, needle core biopsy, 12:00 12cmfn (heart clip) :      INVASIVE DUCTAL CARCINOMA      TUBULE FORMATION: SCORE 3      NUCLEAR PLEOMORPHISM: SCORE 3      MITOTIC COUNT: SCORE 2      TOTAL SCORE: 8      OVERALL GRADE: 3      LYMPHOVASCULAR INVASION: NOT IDENTIFIED      CANCER LENGTH: 1.0 CM      CALCIFICATIONS: NOT IDENTIFIED      OTHER FINDINGS: NONE      ER negative, PR negative, HER2 negative (IHC 0) Ki-67 95%.       2. Breast, right, needle core biopsy, 12:00 8cmfn (coil clip) :      INVASIVE DUCTAL CARCINOMA      DUCTAL CARCINOMA IN SITU, SOLID, HIGH NUCLEAR GRADE WITH NECROSIS      TUBULE FORMATION: SCORE 3      NUCLEAR PLEOMORPHISM: SCORE 3      MITOTIC COUNT: SCORE 2      TOTAL SCORE: 8      OVERALL GRADE: 3      LYMPHOVASCULAR INVASION: NOT IDENTIFIED      CANCER LENGTH: 0.3 CM      CALCIFICATIONS: NOT IDENTIFIED      OTHER FINDINGS: NONE            3. Breast, right, needle core biopsy, 9:00 12cmfn (venus) :      BENIGN BREAST TISSUE WITH DENSE STROMAL FIBROSIS.      NEGATIVE FOR ATYPIA OR MALIGNANCY.    07/08/2024 Surgery   Patient went right breast lumpectomy and sentinel lymph node biopsy.  1. 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      - DUCTAL CARCINOMA IN SITU (DCIS).      - SEE CANCER SUMMARY AND NOTE BELOW.      - TWO BIOPSY SITES WITH CORRESPONDING HEART AND COIL CLIPS.      - TWO SAVI SCOUT TAGS.       2. Lymph node, sentinel, biopsy, Right :      - MICROMETASTATIC CARCINOMA INVOLVES ONE OF FIVE LYMPH NODES (1MI/5).      - SEE CANCER SUMMARY AND NOTE BELOW.       3. Breast, excision, Right medial posterior margin :      - BENIGN BREAST TISSUE.      - NEGATIVE FOR ATYPIA AND MALIGNANCY.      - SEE CANCER SUMMARY BELOW.   TUMOR Histologic Type: Invasive carcinoma of no special type  (ductal) Histologic Grade (Nottingham Histologic Score) Glandular (Acinar)/Tubular Differentiation: 3 Nuclear Pleomorphism: 3 Mitotic Rate: 3 Overall Grade: 3 Tumor Size: Greatest dimension of largest invasive focus: 19 mm Ductal Carcinoma In Situ (DCIS): Present, high-grade with comedonecrosis  Lymphatic and/or Vascular Invasion: Present, extensive (LVI in 2 or more blocks)  Treatment Effect in the Breast: No known presurgical therapy MARGINS Margin Status for Invasive Carcinoma: Invasive Carcinoma involves inferior (unifocal) and superior (unifocal) margins and is 0.5 mm to anterior margin Margin Status for DCIS: All margins negative for DCIS Distance from DCIS to closest margin: 0.5 mm Specify closest margin: Inferior REGIONAL LYMPH NODES Regional Lymph Node Status: Tumor present in regional lymph node(s) Number of Lymph Nodes with Macrometastases (greater than 2 mm): 0 Number of Lymph Nodes with Micrometastases (greater than 0.2 mm to 2 mm and/or greater than 200 cells): 1 Number of Lymph Nodes with Isolated Tumor Cells (0.2 mm or less OR 200 cells or less): 0 Size of Largest Metastatic Deposit: 1.9 mm Extranodal Extension: Not identified Total Number of Lymph Nodes Examined (sentinel and non-sentinel): 5 Number of Sentinel Nodes Examined: 5 DISTANT METASTASIS Distant Site(s) Involved, if applicable: Not applicable PATHOLOGIC STAGE CLASSIFICATION (pTNM, AJCC 8th Edition): Modified Classification: Not applicable pT Category: pT1c T Suffix: (m) multiple primary synchronous tumors in a single organ pN Category: pN60mi N Suffix: (sn) pM Category: Not applicable SPECIAL STUDIES Breast Biomarker Testing Performed on Previous Biopsy: [id number] (heart) Estrogen Receptor: Negative (0%) Progesterone Receptor: Negative (0%) HER2 IHC: Negative (0) Proliferation Marker Ki67: 95%   07/22/2024 Cancer Staging   Staging form: Breast, AJCC 8th Edition - Pathologic stage from 07/22/2024:  Stage IB (pT1c, pN22mi(sn), cM0, G3, ER-, PR-, HER2-) - Signed by Babara Call, MD on 07/22/2024 Stage prefix: Initial diagnosis Method of lymph node assessment: Sentinel lymph node biopsy Multigene prognostic tests performed: None Histologic grading system: 3 grade system   07/25/2024 Surgery   Patient reports additional excision for positive margin.  1. Breast, excision, superior margin of right breast - RESIDUAL INVASIVE DUCTAL CARCINOMA, COMPLETELY EXCISED. - CARCINOMA PRESENT 2 MM FROM THE POSTERIOR MARGIN AND 4 MM FROM THE ANTERIOR AND SUPERIOR MARGINS. 2. Breast, excision, inferior margin of right breast - RESIDUAL INVASIVE DUCTAL CARCINOMA, COMPLETELY EXCISED. - CARCINOMA PRESENT 1 MM FROM THE NEW INFERIOR MARGIN.    Patient has a history of VIN 3 status post vulvectomy  07/11/2024,she was evaluated by cardiology with palpitations and atypical chest pain. Chest pain deemed atypical and she went on to have a normal echo at 07/19/2024.   07/23/2024, patient was seen by symptom management for right neck pain.  She was seen by Tinnie Dawn.  Right upper extremity ultrasound was obtained which showed acute occlusive DVT involving the right internal jugular and subclavian veins.  Patient was started on Lovenox , she was supposed to be started on 1 mg/kg twice daily however was started off 40 mg twice daily which is a subtherapeutic dosage. She was asked to hold off the evening dose prior to her breast re-excision.  07/29/2024, patient presented emergency room due to shortness of breath and chest pain.  CTA angiogram showed subsegmental PE medially in the left lower lobe.  Shortness of breath was felt to be multifactorial secondary to asthma/bronchitis, pulmonary embolism.  Symptoms are better after supportive care and she was discharged on Lovenox  80 mg subcutaneously twice daily.  Today patient presented for evaluation prior to chemotherapy.  She continues to have cough she uses inhalers.  She is on a  course of tapering steroids.  She has an upcoming appointment with pulmonology Dr. Tamea.  She request a refill for her cough suppressant   MEDICAL HISTORY:  Past Medical History:  Diagnosis Date   Anemia  Anginal pain (HCC)    Anxiety    Arthritis    Asthma    Cancer (HCC)    Chronic pain syndrome 05/21/2023   DDD (degenerative disc disease), cervical 05/22/2023   Deep venous thrombosis (HCC)    Depression    Dyspnea    Fibromyalgia    GERD (gastroesophageal reflux disease)    Headache    Heart murmur    History of hiatal hernia    History of kidney stones    Lupus    Malignant neoplasm of upper-outer quadrant of right female breast, unspecified estrogen receptor status (HCC) 2025   Pneumonia    PTSD (post-traumatic stress disorder)    Skin candidiasis 05/29/2024   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    SURGICAL HISTORY: Past Surgical History:  Procedure Laterality Date   BREAST BIOPSY Right 06/18/2024   US  RT BREAST BX W LOC DEV 1ST LESION IMG BX SPEC US  GUIDE 06/18/2024 [facility]   BREAST BIOPSY Right 06/18/2024   US  RT BREAST BX W LOC DEV EA ADD LESION IMG BX SPEC US  GUIDE 06/18/2024 [facility]   BREAST BIOPSY Right 06/18/2024   US  RT BREAST BX W LOC DEV EA ADD LESION IMG BX SPEC US  GUIDE 06/18/2024 [facility]   BREAST BIOPSY Right 07/05/2024   US  RT BREAST SAVI/RF TAG 1ST LESION US  GUIDE 07/05/2024 [facility]   BREAST BIOPSY Right 07/05/2024   US  RT BREAST SAVI/RF TAG EA ADD'L LESION US  GUIDE 07/05/2024 [facility]   BREAST LUMPECTOMY Right 07/25/2024   Procedure: BREAST LUMPECTOMY;  Surgeon: Tye Millet, DO;  Location: [facility];  Service: General;  Laterality: Right;   BREAST LUMPECTOMY WITH RADIO FREQUENCY LOCALIZER Right 07/08/2024   Procedure: BREAST LUMPECTOMY WITH RADIO FREQUENCY LOCALIZER;  Surgeon: Tye Millet, DO;  Location: [facility];  Service: General;  Laterality: Right;   COLONOSCOPY WITH PROPOFOL  N/A  02/17/2023   Procedure: COLONOSCOPY WITH PROPOFOL ;  Surgeon: Maryruth Ole DASEN, MD;  Location: [facility];  Service: Endoscopy;  Laterality: N/A;   ESOPHAGOGASTRODUODENOSCOPY (EGD) WITH PROPOFOL  N/A 02/17/2023   Procedure: ESOPHAGOGASTRODUODENOSCOPY (EGD) WITH PROPOFOL ;  Surgeon: Maryruth Ole DASEN, MD;  Location: [facility];  Service: Endoscopy;  Laterality: N/A;   EXTRACORPOREAL SHOCK WAVE LITHOTRIPSY     PORTACATH PLACEMENT Right 07/08/2024   Procedure: INSERTION, TUNNELED CENTRAL VENOUS DEVICE, WITH PORT;  Surgeon: Tye Millet, DO;  Location: [facility];  Service: General;  Laterality: Right;   RECTAL EXAM UNDER ANESTHESIA N/A 05/16/2024   Procedure: EXAM UNDER ANESTHESIA, RECTUM, INCISIONAL BIOPSY OF ANAL MASS;  Surgeon: Tye Millet, DO;  Location: [facility];  Service: General;  Laterality: N/A;   SENTINEL NODE BIOPSY Right 07/08/2024   Procedure: BIOPSY, LYMPH NODE, SENTINEL;  Surgeon: Tye Millet, DO;  Location: [facility];  Service: General;  Laterality: Right;   TONSILLECTOMY     TUBAL LIGATION     VULVECTOMY N/A 07/29/2020   Procedure: WIDE EXCISION VULVECTOMY;  Surgeon: Mancil Barter, MD;  Location: [facility];  Service: Gynecology;  Laterality: N/A;    SOCIAL HISTORY: Social History   Socioeconomic History   Marital status: Divorced    Spouse name: Not on file   Number of children: 1   Years of education: Not on file   Highest education level: Not on file  Occupational History   Not on file  Tobacco Use   Smoking status: Never   Smokeless tobacco: Never  Vaping Use   Vaping status: Never Used  Substance and Sexual Activity   Alcohol  use: Not Currently   Drug use: No   Sexual activity: Yes  Other Topics Concern   Not on file  Social History Narrative   Not on file   Social Drivers of Health   Financial Resource Strain: Medium Risk (05/15/2024)   Received from [facility]   Overall Financial Resource Strain (CARDIA)    Difficulty of  Paying Living Expenses: Somewhat hard  Food Insecurity: No Food Insecurity (05/29/2024)   Hunger Vital Sign    Worried About Running Out of Food in the Last Year: Never true    Ran Out of Food in the Last Year: Never true  Transportation Needs: No Transportation Needs (05/29/2024)   PRAPARE - Administrator, Civil Service (Medical): No    Lack of Transportation (Non-Medical): No  Physical Activity: Not on file  Stress: Not on file  Social Connections: Not on file  Intimate Partner Violence: Not At Risk (05/29/2024)   Humiliation, Afraid, Rape, and Kick questionnaire    Fear of Current or Ex-Partner: No    Emotionally Abused: No    Physically Abused: No    Sexually Abused: No    FAMILY HISTORY: Family History  Problem Relation Age of Onset   Diabetes Mother    Congestive Heart Failure Mother    Lupus Maternal Grandmother     ALLERGIES:  is allergic to prednisone.  MEDICATIONS:  Current Outpatient Medications  Medication Sig Dispense Refill   acetaminophen  (TYLENOL ) 325 MG tablet Take 2 tablets (650 mg total) by mouth every 8 (eight) hours as needed for mild pain (pain score 1-3). 40 tablet 0   albuterol  (PROVENTIL ) (2.5 MG/3ML) 0.083% nebulizer solution Take 2.5 mg by nebulization every 6 (six) hours as needed for shortness of breath or wheezing.     albuterol  (VENTOLIN  HFA) 108 (90 Base) MCG/ACT inhaler Inhale 2 puffs into the lungs every 4 (four) hours as needed for wheezing or shortness of breath. 8.5 g 2   ascorbic acid (VITAMIN C) 500 MG tablet Take 500 mg by mouth daily.     B Complex-C (SUPER B COMPLEX PO) Take by mouth daily.     benzonatate  (TESSALON ) 200 MG capsule Take 1 capsule (200 mg total) by mouth 3 (three) times daily as needed for cough. 30 capsule 1   BIOTIN PO Take by mouth daily.     busPIRone  (BUSPAR ) 5 MG tablet Take 5 mg by mouth 2 (two) times daily.     cholecalciferol (VITAMIN D3) 25 MCG (1000 UNIT) tablet Take 1,000 Units by mouth daily.      clonazePAM (KLONOPIN) 0.5 MG tablet Take 0.5 mg by mouth at bedtime as needed (anxiety/sleep).     DULoxetine  (CYMBALTA ) 30 MG capsule Take 30 mg by mouth at bedtime.     enoxaparin  (LOVENOX ) 40 MG/0.4ML injection Inject 0.875 mLs (87.5 mg total) into the skin every 12 (twelve) hours. 52.5 mL 0   ferrous sulfate 325 (65 FE) MG tablet Take 325 mg by mouth daily.      gabapentin  (NEURONTIN ) 300 MG capsule Take 600 mg by mouth 2 (two) times daily.     ipratropium (ATROVENT  HFA) 17 MCG/ACT inhaler Inhale 2 puffs into the lungs every 6 (six) hours as needed for wheezing (COUGH). 12.9 g 1   lidocaine  (LIDODERM ) 5 % 1 patch daily.     lidocaine -prilocaine  (EMLA ) cream Apply to affected area once 30 g 3   linaclotide (LINZESS) 145 MCG CAPS capsule Take 145 mcg by mouth  daily before breakfast.     montelukast  (SINGULAIR ) 10 MG tablet Take 1 tablet (10 mg total) by mouth See admin instructions. Take 1 tablet on the day prior to chemotherapy and take 1 tablet daily for 2 days after chemotherapy. 30 tablet 0   Multiple Vitamin (MULTIVITAMIN WITH MINERALS) TABS tablet Take 1 tablet by mouth daily.     [Paused] naproxen  (EC NAPROSYN ) 500 MG EC tablet Take 1 tablet (500 mg total) by mouth 2 (two) times daily with a meal. 60 tablet 0   nystatin  (MYCOSTATIN /NYSTOP ) powder Apply 1 Application topically 2 (two) times daily. 20 g 0   ondansetron  (ZOFRAN ) 8 MG tablet Take 1 tablet (8 mg total) by mouth every 8 (eight) hours as needed for nausea or vomiting. 30 tablet 1   pantoprazole (PROTONIX) 40 MG tablet Take by mouth.     prochlorperazine  (COMPAZINE ) 10 MG tablet Take 1 tablet (10 mg total) by mouth every 6 (six) hours as needed for nausea or vomiting. 30 tablet 1   traMADol  (ULTRAM ) 50 MG tablet Take 1 tablet (50 mg total) by mouth every 8 (eight) hours as needed. 15 tablet 0   traZODone  (DESYREL ) 50 MG tablet Take 50 mg by mouth at bedtime.     Turmeric (QC TUMERIC COMPLEX PO) Take by mouth daily.      promethazine -dextromethorphan  (PROMETHAZINE -DM) 6.25-15 MG/5ML syrup Take 5 mLs by mouth 4 (four) times daily as needed for cough. 118 mL 0   No current facility-administered medications for this visit.   Facility-Administered Medications Ordered in Other Visits  Medication Dose Route Frequency Provider Last Rate Last Admin   0.9 %  sodium chloride  infusion   Intravenous Continuous Babara Call, MD   Stopped at 07/31/24 1524   fluorouracil  (ADRUCIL ) 7,000 mg in sodium chloride  0.9 % 110 mL chemo infusion  736 mg/m2/day (Treatment Plan Recorded) Intravenous 5 days Dolan, Carissa E, [facility]   Infusion Verify at 07/31/24 1532    Review of Systems  Constitutional:  Negative for appetite change, chills, fatigue and fever.  HENT:   Negative for hearing loss and voice change.   Eyes:  Negative for eye problems.  Respiratory:  Positive for cough and shortness of breath. Negative for chest tightness.   Cardiovascular:  Negative for chest pain.  Gastrointestinal:  Negative for abdominal distention, abdominal pain and blood in stool.       Anal mass with drainage  Endocrine: Negative for hot flashes.  Genitourinary:  Negative for difficulty urinating and frequency.   Musculoskeletal:  Negative for arthralgias.  Skin:  Negative for itching and rash.  Neurological:  Negative for extremity weakness.  Hematological:  Negative for adenopathy.  Psychiatric/Behavioral:  Negative for confusion.      PHYSICAL EXAMINATION: ECOG PERFORMANCE STATUS: 0 - Asymptomatic  Vitals:   07/31/24 1347  BP: 106/72  Pulse: 100  Resp: 16  Temp: 98.1 F (36.7 C)  SpO2: 98%   Filed Weights   07/31/24 1347  Weight: 200 lb (90.7 kg)    Physical Exam Constitutional:      General: She is not in acute distress.    Appearance: She is not diaphoretic.  HENT:     Head: Normocephalic and atraumatic.     Nose: Nose normal.     Mouth/Throat:     Pharynx: No oropharyngeal exudate.  Eyes:     General: No scleral  icterus.    Pupils: Pupils are equal, round, and reactive to light.  Cardiovascular:  Rate and Rhythm: Normal rate and regular rhythm.     Heart sounds: No murmur heard. Pulmonary:     Effort: Pulmonary effort is normal. No respiratory distress.     Breath sounds: No wheezing.  Abdominal:     General: There is no distension.     Palpations: Abdomen is soft.     Tenderness: There is no abdominal tenderness.  Genitourinary:    Comments: Firm anal mass 4-5cm Musculoskeletal:        General: Normal range of motion.     Cervical back: Normal range of motion and neck supple.  Skin:    General: Skin is warm and dry.     Findings: No erythema.  Neurological:     Mental Status: She is alert and oriented to person, place, and time. Mental status is at baseline.     Motor: No abnormal muscle tone.  Psychiatric:        Mood and Affect: Affect normal.         LABORATORY DATA:  I have reviewed the data as listed    Latest Ref Rng & Units 07/31/2024    1:28 PM 07/30/2024    3:30 AM 07/29/2024    3:28 PM  CBC  WBC 4.0 - 10.5 K/uL 12.1  16.4  13.2   Hemoglobin 12.0 - 15.0 g/dL 87.0  86.6  86.6   Hematocrit 36.0 - 46.0 % 40.2  41.2  42.0   Platelets 150 - 400 K/uL 384  459  429       Latest Ref Rng & Units 07/31/2024    1:28 PM 07/29/2024    3:28 PM 06/30/2024    7:36 PM  CMP  Glucose 70 - 99 mg/dL 878  85  98   BUN 6 - 20 mg/dL 9  7  6    Creatinine 0.44 - 1.00 mg/dL 9.08  9.07  9.09   Sodium 135 - 145 mmol/L 135  140  140   Potassium 3.5 - 5.1 mmol/L 3.3  3.5  3.5   Chloride 98 - 111 mmol/L 102  104  103   CO2 22 - 32 mmol/L 24  26  25    Calcium 8.9 - 10.3 mg/dL 8.4  8.5  9.3   Total Protein 6.5 - 8.1 g/dL 6.2     Total Bilirubin 0.0 - 1.2 mg/dL 0.8     Alkaline Phos 38 - 126 U/L 74     AST 15 - 41 U/L 23     ALT 0 - 44 U/L 18        RADIOGRAPHIC STUDIES: I have personally reviewed the radiological images as listed and agreed with the findings in the report. CT Angio  Chest PE W and/or Wo Contrast Addendum Date: 07/29/2024 ADDENDUM REPORT: 07/29/2024 18:47 ADDENDUM: The original report was by Dr. Ryan Salvage. The following addendum is by Dr. Ryan Salvage: Critical Value/emergent results were called by telephone at the time of interpretation on 07/29/2024 at 6:47 pm to provider MARK QUALE , who verbally acknowledged these results. Electronically Signed   By: Ryan Salvage M.D.   On: 07/29/2024 18:47   Result Date: 07/29/2024 CLINICAL DATA:  Deep vein thrombosis, dyspnea, high probability of pulmonary embolus. Recent diagnosis of anal squamous cell carcinoma. * Tracking Code: BO * EXAM: CT ANGIOGRAPHY CHEST WITH CONTRAST TECHNIQUE: Multidetector CT imaging of the chest was performed using the standard protocol during bolus administration of intravenous contrast. Multiplanar CT image reconstructions and MIPs were obtained to  evaluate the vascular anatomy. RADIATION DOSE REDUCTION: This exam was performed according to the departmental dose-optimization program which includes automated exposure control, adjustment of the mA and/or kV according to patient size and/or use of iterative reconstruction technique. CONTRAST:  75mL OMNIPAQUE  IOHEXOL  350 MG/ML SOLN COMPARISON:  06/30/2024 FINDINGS: Cardiovascular: Right Port-A-Cath tip: Right internal jugular vein just above the brachiocephalic confluence. Surrounding hypodensity, persistent thrombus in the right internal jugular vein not excluded. Acute subsegmental pulmonary embolus medially in the left lower lobe as on images 180-201 of series 6. No lobar or larger embolus identified. Mediastinum/Nodes: Unremarkable Lungs/Pleura: Mild subsegmental atelectasis in the right lower lobe. Upper Abdomen: Unremarkable Musculoskeletal: Unremarkable Review of the MIP images confirms the above findings. IMPRESSION: 1. Acute subsegmental pulmonary embolus medially in the left lower lobe. No lobar or larger embolus identified. 2. Right  Port-A-Cath tip: Right internal jugular vein just above the brachiocephalic confluence. Surrounding hypodensity in the right internal jugular vein, persistent thrombus in the right internal jugular vein (as shown on ultrasound exam of 07/23/2024) not excluded. 3. Mild subsegmental atelectasis in the right lower lobe. Radiology assistant personnel have been notified to put me in telephone contact with the referring physician or the referring physician's clinical representative in order to discuss these findings. Once this communication is established I will issue an addendum to this report for documentation purposes. Electronically Signed: By: Ryan Salvage M.D. On: 07/29/2024 18:38   DG Chest 2 View Result Date: 07/29/2024 CLINICAL DATA:  Shortness of breath. EXAM: CHEST - 2 VIEW COMPARISON:  07/08/2024, CT 06/30/2024 FINDINGS: Stable positioning of right chest port with tip overlying the upper SVC. Normal heart size with stable mediastinal contours. No focal airspace disease, pleural effusion, pulmonary edema or pneumothorax. No acute osseous abnormalities are seen. IMPRESSION: No acute chest findings. Electronically Signed   By: Andrea Gasman M.D.   On: 07/29/2024 16:39   US  Venous Img Upper Uni Right Result Date: 07/23/2024 CLINICAL DATA:  Right neck pain at Port-A-Cath site EXAM: Right UPPER EXTREMITY VENOUS DOPPLER ULTRASOUND TECHNIQUE: Gray-scale sonography with graded compression, as well as color Doppler and duplex ultrasound were performed to evaluate the upper extremity deep venous system from the level of the subclavian vein and including the jugular, axillary, basilic, radial, ulnar and upper cephalic vein. Spectral Doppler was utilized to evaluate flow at rest and with distal augmentation maneuvers. COMPARISON:  None Available. FINDINGS: Contralateral Subclavian Vein: Respiratory phasicity is normal and symmetric with the symptomatic side. No evidence of thrombus. Normal compressibility.  Internal Jugular Vein: Positive for acute occlusive thrombus. Incompletely compressible Subclavian Vein: Positive for acute occlusive thrombus. Axillary Vein: No evidence of thrombus. Normal compressibility, respiratory phasicity and response to augmentation. Cephalic Vein: No evidence of thrombus. Normal compressibility, respiratory phasicity and response to augmentation. Basilic Vein: No evidence of thrombus. Normal compressibility, respiratory phasicity and response to augmentation. Brachial Veins: No evidence of thrombus. Normal compressibility, respiratory phasicity and response to augmentation. Radial Veins: No evidence of thrombus. Normal compressibility, respiratory phasicity and response to augmentation. Ulnar Veins: No evidence of thrombus. Normal compressibility, respiratory phasicity and response to augmentation. IMPRESSION: Positive for acute occlusive DVT involving the right internal jugular and subclavian veins. Critical Value/emergent results were called by telephone at the time of interpretation on 07/23/2024 at 3:38 pm to provider TINNIE DAWN , who verbally acknowledged these results. Electronically Signed   By: Luke Bun M.D.   On: 07/23/2024 15:39   DG CHEST PORT 1 VIEW Result Date: 07/08/2024 CLINICAL DATA:  [id number] Port-A-Cath in place [id number] EXAM: PORTABLE CHEST 1 VIEW COMPARISON:  06/30/2024. FINDINGS: Right chest Port-A-Cath tip overlies the upper SVC. No pneumothorax. Low lung volumes with accentuated interstitial markings. Streaky opacities at the right lung base, favored to reflect atelectasis, although infiltrate cannot be excluded. No pleural effusion. The heart size and mediastinal contours are unchanged. Visualized osseous structures are unchanged. IMPRESSION: 1. Right chest Port-A-Cath tip overlies the upper SVC. No pneumothorax. 2. Low lung volumes with streaky opacities at the right lung base, favored to reflect atelectasis, although infiltrate cannot be excluded.  Electronically Signed   By: Harrietta Sherry M.D.   On: 07/08/2024 14:28   DG C-Arm 1-60 Min-No Report Result Date: 07/08/2024 Fluoroscopy was utilized by the requesting physician.  No radiographic interpretation.   MM Breast Surgical Specimen Result Date: 07/08/2024 CLINICAL DATA:  Status post [facility] localized RIGHT breast lumpectomy. Patient is status post ultrasound-guided biopsy and subsequent SAVI scout localization of 2 sites of biopsy-proven malignancy at the heart and COIL clip. History of recently diagnosed metastatic anal cancer. EXAM: SPECIMEN RADIOGRAPH OF THE RIGHT BREAST COMPARISON:  Previous exam(s). FINDINGS: Status post excision of the RIGHT breast. The 2 Savi Scout reflectors, COIL shaped biopsy marking clip and heart shaped clip are present within the specimen. IMPRESSION: Specimen radiograph of the RIGHT breast. Electronically Signed   By: Corean Salter M.D.   On: 07/08/2024 11:37   NM Sentinel Node Inj-No Rpt (Breast) Result Date: 07/08/2024 Lymphoseek was injected by the Nuclear Medicine Technologist for sentinel lymph node localization.   MM 3D DIAGNOSTIC MAMMOGRAM UNILATERAL RIGHT BREAST Result Date: 07/05/2024 CLINICAL DATA:  Status post same day SAVI scout localization under ultrasound guidance x2 EXAM: DIGITAL DIAGNOSTIC UNILATERAL RIGHT MAMMOGRAM WITH TOMOSYNTHESIS AND CAD TECHNIQUE: Right digital diagnostic mammography and breast tomosynthesis was performed. The images were evaluated with computer-aided detection. COMPARISON:  Previous exam(s). ACR Breast Density Category c: The breasts are heterogeneously dense, which may obscure small masses. FINDINGS: Post localization mammogram demonstrates appropriate positioning of SAVI scout reflectors at the site biopsy-proven IDC (heart clip) and biopsy-proven IDC (coil clip). Postprocedural changes from lidocaine  injection are noted surround both prior biopsy sites. A Venus clip is noted in the LATERAL breast, corresponding  with site of benign biopsy. IMPRESSION: Successful SAVI scout localization of both sites of biopsy-proven IDC in the RIGHT breast. Images were marked for Dr. Tye. RECOMMENDATION: Treatment plan per surgical/oncology team. I have discussed the findings and recommendations with the patient. If applicable, a reminder letter will be sent to the patient regarding the next appointment. BI-RADS CATEGORY  6: Known biopsy-proven malignancy. Electronically Signed   By: Norleen Croak M.D.   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 FINDINGS: Patient presents for needle localization prior to localization for RIGHT breast lumpectomy for 2 sites of biopsy-proven IDC. I met with the patient and we discussed the procedure of needle localization including benefits and alternatives. We discussed the high likelihood of a successful procedure. We discussed the risks of the procedure, including infection, bleeding, tissue injury, and further surgery. Informed, written consent was given. The usual time-out protocol was performed immediately prior to the procedure. Site 1: Mass at 12 o'clock, 8 cm from the nipple (coil clip) Using ultrasound guidance,  sterile technique, 1% lidocaine  and a 10 cm SAVI SCOUT needle, the mass at 12 o'clock 8 cm from the nipple was localized using a LATERAL approach. The images were marked for Dr. Tye. Site 2: Mass at 12 o'clock, 12 cm from the nipple (heart clip) Using ultrasound guidance, sterile technique, 1% lidocaine  and a 10 cm SAVI SCOUT needle, the mass at 12 o'clock 12 cm from the nipple was localized using a LATERAL approach. The images were marked for Dr. Tye. IMPRESSION: Radar reflector localization of the RIGHT breast  x2. No apparent complications. Electronically Signed   By: Norleen Croak M.D.   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 FINDINGS: Patient presents for needle localization prior to localization for RIGHT breast lumpectomy for 2 sites of biopsy-proven IDC. I met with the patient and we discussed the procedure of needle localization including benefits and alternatives. We discussed the high likelihood of a successful procedure. We discussed the risks of the procedure, including infection, bleeding, tissue injury, and further surgery. Informed, written consent was given. The usual time-out protocol was performed immediately prior to the procedure. Site 1: Mass at 12 o'clock, 8 cm from the nipple (coil clip) Using ultrasound guidance, sterile technique, 1% lidocaine  and a 10 cm SAVI SCOUT needle, the mass at 12 o'clock 8 cm from the nipple was localized using a LATERAL approach. The images were marked for Dr. Tye. Site 2: Mass at 12 o'clock, 12 cm from the nipple (heart clip) Using ultrasound guidance, sterile technique, 1% lidocaine  and a 10 cm SAVI SCOUT needle, the mass at 12 o'clock 12 cm from the nipple was localized using a LATERAL approach. The images were marked for Dr. Tye. IMPRESSION: Radar reflector localization of the RIGHT breast x2. No apparent complications. Electronically Signed   By: Norleen Croak M.D.   On: 07/05/2024 08:03

## 2024-07-31 NOTE — Assessment & Plan Note (Signed)
 Discussed about bowel regimen with Colace 100 mg 1-2 times daily, MiraLAX  daily as needed.

## 2024-07-31 NOTE — Assessment & Plan Note (Signed)
 Cough, possible bronchitis.  Continue inhalers.  Add trial of singular.  Reviewed cough suppressant.  Recommend patient to follow-up with pulmonology.

## 2024-07-31 NOTE — Patient Instructions (Signed)
 [facility] - A DEPT OF [facility]  Discharge Instructions: Thank you for choosing [facility] to provide your oncology and hematology care.  If you have a lab appointment with the Cancer Center, please go directly to the Cancer Center and check in at the registration area.  Wear comfortable clothing and clothing appropriate for easy access to any Portacath or PICC line.   We strive to give you quality time with your provider. You may need to reschedule your appointment if you arrive late (15 or more minutes).  Arriving late affects you and other patients whose appointments are after yours.  Also, if you miss three or more appointments without notifying the office, you may be dismissed from the clinic at the provider's discretion.      For prescription refill requests, have your pharmacy contact our office and allow 72 hours for refills to be completed.    Today you received the following chemotherapy and/or immunotherapy agents- mitomycin , 5FU      To help prevent nausea and vomiting after your treatment, we encourage you to take your nausea medication as directed.  BELOW ARE SYMPTOMS THAT SHOULD BE REPORTED IMMEDIATELY: *FEVER GREATER THAN 100.4 F (38 C) OR HIGHER *CHILLS OR SWEATING *NAUSEA AND VOMITING THAT IS NOT CONTROLLED WITH YOUR NAUSEA MEDICATION *UNUSUAL SHORTNESS OF BREATH *UNUSUAL BRUISING OR BLEEDING *URINARY PROBLEMS (pain or burning when urinating, or frequent urination) *BOWEL PROBLEMS (unusual diarrhea, constipation, pain near the anus) TENDERNESS IN MOUTH AND THROAT WITH OR WITHOUT PRESENCE OF ULCERS (sore throat, sores in mouth, or a toothache) UNUSUAL RASH, SWELLING OR PAIN  UNUSUAL VAGINAL DISCHARGE OR ITCHING   Items with * indicate a potential emergency and should be followed up as soon as possible or go to the Emergency Department if any problems should occur.  Please show the CHEMOTHERAPY ALERT CARD or  IMMUNOTHERAPY ALERT CARD at check-in to the Emergency Department and triage nurse.  Should you have questions after your visit or need to cancel or reschedule your appointment, please contact [facility] - A DEPT OF [facility]  [phone] and follow the prompts.  Office hours are 8:00 a.m. to 4:30 p.m. Monday - Friday. Please note that voicemails left after 4:00 p.m. may not be returned until the following business day.  We are closed weekends and major holidays. You have access to a nurse at all times for urgent questions. Please call the main number to the clinic [phone] and follow the prompts.  For any non-urgent questions, you may also contact your provider using MyChart. We now offer e-Visits for anyone 41 and older to request care online for non-urgent symptoms. For details visit [email or web address].   Also download the MyChart app! Go to the app store, search MyChart, open the app, select [place], and log in with your MyChart username and password.

## 2024-07-31 NOTE — Assessment & Plan Note (Signed)
 This is not a Lovenox  failure.  Likely a combination of holding Lovenox  due to additional breast reexcision.  Acute bronchitis.  And initial subtherapeutic dosage of Lovenox . Continue Lovenox  1 mg/kg twice daily.

## 2024-07-31 NOTE — Assessment & Plan Note (Addendum)
 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  Per IR, right inguinal lymph node is not enlarged.  Recommend concurrent chemotherapy 5-FU and mitomycin  C with Radiation. She has finished surgical resection of breast cancer. Labs reviewed and discussed with patient.  Proceed with day one 5-FU/Mitomycin -C.  She is started radiation today.  Rationale and potential side effects were reviewed and discussed with patient.

## 2024-08-01 ENCOUNTER — Encounter: Payer: Self-pay | Admitting: Oncology

## 2024-08-01 ENCOUNTER — Ambulatory Visit
Admission: RE | Admit: 2024-08-01 | Discharge: 2024-08-01 | Disposition: A | Discharge status: Home / Self Care | Source: Ambulatory Visit | Attending: Radiation Oncology | Admitting: Radiation Oncology

## 2024-08-01 ENCOUNTER — Other Ambulatory Visit: Payer: Self-pay

## 2024-08-01 DIAGNOSIS — C210 Malignant neoplasm of anus, unspecified: Secondary | ICD-10-CM | POA: Diagnosis not present

## 2024-08-01 LAB — RAD ONC ARIA SESSION SUMMARY
Course Elapsed Days: 0
Plan Fractions Treated to Date: 1
Plan Prescribed Dose Per Fraction: 1.8 Gy
Plan Total Fractions Prescribed: 30
Plan Total Prescribed Dose: 54 Gy
Reference Point Dosage Given to Date: 1.8 Gy
Reference Point Session Dosage Given: 1.8 Gy
Session Number: 1

## 2024-08-01 NOTE — Telephone Encounter (Signed)
 Pt requesting Right shoulder ray due to increased pain. Please advise.

## 2024-08-02 ENCOUNTER — Inpatient Hospital Stay

## 2024-08-02 ENCOUNTER — Ambulatory Visit
Admission: RE | Admit: 2024-08-02 | Discharge: 2024-08-02 | Disposition: A | Discharge status: Home / Self Care | Attending: Oncology | Admitting: Oncology

## 2024-08-02 ENCOUNTER — Ambulatory Visit
Admission: RE | Admit: 2024-08-02 | Discharge: 2024-08-02 | Disposition: A | Discharge status: Home / Self Care | Source: Ambulatory Visit | Attending: Radiation Oncology | Admitting: Radiation Oncology

## 2024-08-02 ENCOUNTER — Encounter: Payer: Self-pay | Admitting: Oncology

## 2024-08-02 ENCOUNTER — Ambulatory Visit
Admission: RE | Admit: 2024-08-02 | Discharge: 2024-08-02 | Disposition: A | Discharge status: Home / Self Care | Source: Ambulatory Visit | Attending: Oncology | Admitting: Oncology

## 2024-08-02 ENCOUNTER — Telehealth: Payer: Self-pay

## 2024-08-02 ENCOUNTER — Other Ambulatory Visit: Payer: Self-pay

## 2024-08-02 DIAGNOSIS — M25511 Pain in right shoulder: Secondary | ICD-10-CM

## 2024-08-02 DIAGNOSIS — C210 Malignant neoplasm of anus, unspecified: Secondary | ICD-10-CM

## 2024-08-02 DIAGNOSIS — I82621 Acute embolism and thrombosis of deep veins of right upper extremity: Secondary | ICD-10-CM

## 2024-08-02 LAB — RAD ONC ARIA SESSION SUMMARY
Course Elapsed Days: 1
Plan Fractions Treated to Date: 2
Plan Prescribed Dose Per Fraction: 1.8 Gy
Plan Total Fractions Prescribed: 30
Plan Total Prescribed Dose: 54 Gy
Reference Point Dosage Given to Date: 3.6 Gy
Reference Point Session Dosage Given: 1.8 Gy
Session Number: 2

## 2024-08-02 NOTE — Telephone Encounter (Signed)
 Clinical Social Work was referred by medical provider for assessment of psychosocial needs.  CSW attempted to contact patient by phone.  Left voicemail with contact information and request for return call.

## 2024-08-04 LAB — MISC LABCORP TEST (SEND OUT): Labcorp test code: 117101

## 2024-08-05 ENCOUNTER — Ambulatory Visit: Payer: Self-pay | Admitting: Oncology

## 2024-08-05 ENCOUNTER — Ambulatory Visit
Admission: RE | Admit: 2024-08-05 | Discharge: 2024-08-05 | Disposition: A | Discharge status: Home / Self Care | Source: Ambulatory Visit | Attending: Radiation Oncology | Admitting: Radiation Oncology

## 2024-08-05 ENCOUNTER — Inpatient Hospital Stay

## 2024-08-05 ENCOUNTER — Other Ambulatory Visit: Payer: Self-pay

## 2024-08-05 VITALS — BP 127/76 | HR 81 | Temp 98.3°F | Resp 18

## 2024-08-05 DIAGNOSIS — C210 Malignant neoplasm of anus, unspecified: Secondary | ICD-10-CM

## 2024-08-05 LAB — RAD ONC ARIA SESSION SUMMARY
Course Elapsed Days: 4
Plan Fractions Treated to Date: 3
Plan Prescribed Dose Per Fraction: 1.8 Gy
Plan Total Fractions Prescribed: 30
Plan Total Prescribed Dose: 54 Gy
Reference Point Dosage Given to Date: 5.4 Gy
Reference Point Session Dosage Given: 1.8 Gy
Session Number: 3

## 2024-08-05 NOTE — Telephone Encounter (Signed)
 Per Dr. Jacobo, pt called on call number regarding itching. Pt told Dr. Jacobo that she was seeing Dr. Tye this morning, so he advised for her to let him know about the itching. He also recommended steroid cream.

## 2024-08-05 NOTE — Telephone Encounter (Signed)
 Pt coming in this afternoon for pump dc

## 2024-08-06 ENCOUNTER — Other Ambulatory Visit: Payer: Self-pay

## 2024-08-06 ENCOUNTER — Ambulatory Visit
Admission: RE | Admit: 2024-08-06 | Discharge: 2024-08-06 | Disposition: A | Discharge status: Home / Self Care | Source: Ambulatory Visit | Attending: Radiation Oncology | Admitting: Radiation Oncology

## 2024-08-06 ENCOUNTER — Encounter: Payer: Self-pay | Admitting: Oncology

## 2024-08-06 DIAGNOSIS — C210 Malignant neoplasm of anus, unspecified: Secondary | ICD-10-CM | POA: Diagnosis not present

## 2024-08-06 LAB — RAD ONC ARIA SESSION SUMMARY
Course Elapsed Days: 5
Plan Fractions Treated to Date: 4
Plan Prescribed Dose Per Fraction: 1.8 Gy
Plan Total Fractions Prescribed: 30
Plan Total Prescribed Dose: 54 Gy
Reference Point Dosage Given to Date: 7.2 Gy
Reference Point Session Dosage Given: 1.8 Gy
Session Number: 4

## 2024-08-06 MED ORDER — STERILE WATER FOR INJECTION IJ SOLN
10.0000 mL | Freq: Four times a day (QID) | OROMUCOSAL | 1 refills | Status: DC | PRN
  Filled 2024-08-06: qty 240, 6d supply, fill #0

## 2024-08-06 MED ORDER — NYSTATIN 100000 UNIT/ML MT SUSP
5.0000 mL | Freq: Four times a day (QID) | OROMUCOSAL | 1 refills | Status: DC
  Filled 2024-08-06: qty 473, 24d supply, fill #0

## 2024-08-07 ENCOUNTER — Other Ambulatory Visit: Payer: Self-pay

## 2024-08-07 ENCOUNTER — Inpatient Hospital Stay

## 2024-08-07 ENCOUNTER — Ambulatory Visit
Admission: RE | Admit: 2024-08-07 | Discharge: 2024-08-07 | Disposition: A | Discharge status: Home / Self Care | Source: Ambulatory Visit | Attending: Radiation Oncology | Admitting: Radiation Oncology

## 2024-08-07 DIAGNOSIS — C210 Malignant neoplasm of anus, unspecified: Secondary | ICD-10-CM

## 2024-08-07 LAB — CBC WITH DIFFERENTIAL (CANCER CENTER ONLY)
Abs Immature Granulocytes: 0.04 K/uL (ref 0.00–0.07)
Basophils Absolute: 0.1 K/uL (ref 0.0–0.1)
Basophils Relative: 1 %
Eosinophils Absolute: 0.1 K/uL (ref 0.0–0.5)
Eosinophils Relative: 2 %
HCT: 38.2 % (ref 36.0–46.0)
Hemoglobin: 12.3 g/dL (ref 12.0–15.0)
Immature Granulocytes: 1 %
Lymphocytes Relative: 17 %
Lymphs Abs: 0.9 K/uL (ref 0.7–4.0)
MCH: 30.3 pg (ref 26.0–34.0)
MCHC: 32.2 g/dL (ref 30.0–36.0)
MCV: 94.1 fL (ref 80.0–100.0)
Monocytes Absolute: 0.2 K/uL (ref 0.1–1.0)
Monocytes Relative: 4 %
Neutro Abs: 4.1 K/uL (ref 1.7–7.7)
Neutrophils Relative %: 75 %
Platelet Count: 282 K/uL (ref 150–400)
RBC: 4.06 MIL/uL (ref 3.87–5.11)
RDW: 13.3 % (ref 11.5–15.5)
WBC Count: 5.4 K/uL (ref 4.0–10.5)
nRBC: 0 % (ref 0.0–0.2)

## 2024-08-07 LAB — RAD ONC ARIA SESSION SUMMARY
Course Elapsed Days: 6
Plan Fractions Treated to Date: 5
Plan Prescribed Dose Per Fraction: 1.8 Gy
Plan Total Fractions Prescribed: 30
Plan Total Prescribed Dose: 54 Gy
Reference Point Dosage Given to Date: 9 Gy
Reference Point Session Dosage Given: 1.8 Gy
Session Number: 5

## 2024-08-07 LAB — CMP (CANCER CENTER ONLY)
ALT: 57 U/L — ABNORMAL HIGH (ref 0–44)
AST: 43 U/L — ABNORMAL HIGH (ref 15–41)
Albumin: 3.7 g/dL (ref 3.5–5.0)
Alkaline Phosphatase: 93 U/L (ref 38–126)
Anion gap: 8 (ref 5–15)
BUN: 9 mg/dL (ref 6–20)
CO2: 25 mmol/L (ref 22–32)
Calcium: 8.7 mg/dL — ABNORMAL LOW (ref 8.9–10.3)
Chloride: 101 mmol/L (ref 98–111)
Creatinine: 0.95 mg/dL (ref 0.44–1.00)
GFR, Estimated: >60 mL/min (ref >60)
Glucose, Bld: 135 mg/dL — ABNORMAL HIGH (ref 70–99)
Potassium: 3.3 mmol/L — ABNORMAL LOW (ref 3.5–5.1)
Sodium: 134 mmol/L — ABNORMAL LOW (ref 135–145)
Total Bilirubin: 1 mg/dL (ref 0.0–1.2)
Total Protein: 6.9 g/dL (ref 6.5–8.1)

## 2024-08-08 ENCOUNTER — Encounter: Payer: Self-pay | Admitting: Hospice and Palliative Medicine

## 2024-08-08 ENCOUNTER — Telehealth: Payer: Self-pay

## 2024-08-08 ENCOUNTER — Inpatient Hospital Stay

## 2024-08-08 ENCOUNTER — Ambulatory Visit (INDEPENDENT_AMBULATORY_CARE_PROVIDER_SITE_OTHER): Admitting: Pulmonary Disease

## 2024-08-08 ENCOUNTER — Encounter: Payer: Self-pay | Admitting: Pulmonary Disease

## 2024-08-08 ENCOUNTER — Ambulatory Visit
Admission: RE | Admit: 2024-08-08 | Discharge: 2024-08-08 | Disposition: A | Discharge status: Home / Self Care | Source: Ambulatory Visit | Attending: Radiation Oncology | Admitting: Radiation Oncology

## 2024-08-08 ENCOUNTER — Other Ambulatory Visit: Payer: Self-pay

## 2024-08-08 ENCOUNTER — Ambulatory Visit (HOSPITAL_BASED_OUTPATIENT_CLINIC_OR_DEPARTMENT_OTHER): Admitting: Hospice and Palliative Medicine

## 2024-08-08 VITALS — BP 111/78 | HR 82 | Temp 97.3°F | Resp 16 | Wt 188.0 lb

## 2024-08-08 VITALS — BP 120/88 | HR 74 | Temp 97.7°F | Ht 59.0 in | Wt 191.6 lb

## 2024-08-08 DIAGNOSIS — K219 Gastro-esophageal reflux disease without esophagitis: Secondary | ICD-10-CM

## 2024-08-08 DIAGNOSIS — C210 Malignant neoplasm of anus, unspecified: Secondary | ICD-10-CM

## 2024-08-08 DIAGNOSIS — K1230 Oral mucositis (ulcerative), unspecified: Secondary | ICD-10-CM

## 2024-08-08 DIAGNOSIS — R1310 Dysphagia, unspecified: Secondary | ICD-10-CM

## 2024-08-08 DIAGNOSIS — R052 Subacute cough: Secondary | ICD-10-CM

## 2024-08-08 DIAGNOSIS — J45991 Cough variant asthma: Secondary | ICD-10-CM | POA: Diagnosis not present

## 2024-08-08 DIAGNOSIS — E876 Hypokalemia: Secondary | ICD-10-CM

## 2024-08-08 DIAGNOSIS — I2699 Other pulmonary embolism without acute cor pulmonale: Secondary | ICD-10-CM | POA: Diagnosis not present

## 2024-08-08 DIAGNOSIS — C50919 Malignant neoplasm of unspecified site of unspecified female breast: Secondary | ICD-10-CM

## 2024-08-08 LAB — RAD ONC ARIA SESSION SUMMARY
Course Elapsed Days: 7
Plan Fractions Treated to Date: 6
Plan Prescribed Dose Per Fraction: 1.8 Gy
Plan Total Fractions Prescribed: 30
Plan Total Prescribed Dose: 54 Gy
Reference Point Dosage Given to Date: 10.8 Gy
Reference Point Session Dosage Given: 1.8 Gy
Session Number: 6

## 2024-08-08 LAB — NITRIC OXIDE: Nitric Oxide: 8

## 2024-08-08 MED ORDER — GUAIFENESIN-CODEINE 100-10 MG/5ML PO SOLN: 5.0000 mL | Freq: Four times a day (QID) | ORAL | 0 refills | Status: DC | PRN

## 2024-08-08 MED ORDER — BUDESONIDE 0.25 MG/2ML IN SUSP: 0.2500 mg | Freq: Two times a day (BID) | RESPIRATORY_TRACT | 12 refills | Status: AC

## 2024-08-08 MED ORDER — LIDOCAINE VISCOUS HCL 2 % MT SOLN: 15.0000 mL | OROMUCOSAL | 0 refills | Status: DC | PRN

## 2024-08-08 MED ORDER — POTASSIUM CHLORIDE CRYS ER 20 MEQ PO TBCR: 20.0000 meq | EXTENDED_RELEASE_TABLET | Freq: Every day | ORAL | 0 refills | Status: DC

## 2024-08-08 MED ORDER — SUCRALFATE 1 G PO TABS: 1.0000 g | ORAL_TABLET | Freq: Three times a day (TID) | ORAL | 1 refills | Status: DC

## 2024-08-08 MED ORDER — IPRATROPIUM-ALBUTEROL 0.5-2.5 (3) MG/3ML IN SOLN: 3.0000 mL | Freq: Four times a day (QID) | RESPIRATORY_TRACT | 3 refills | Status: AC

## 2024-08-08 MED ORDER — IPRATROPIUM-ALBUTEROL 0.5-2.5 (3) MG/3ML IN SOLN
3.0000 mL | Freq: Once | RESPIRATORY_TRACT | Status: AC
  Administered 2024-08-08: 3 mL via RESPIRATORY_TRACT

## 2024-08-08 MED ORDER — POTASSIUM CHLORIDE 10 MEQ/100ML IV SOLN
10.0000 meq | Freq: Once | INTRAVENOUS | Status: AC
  Administered 2024-08-08: 10 meq via INTRAVENOUS
  Filled 2024-08-08: qty 100

## 2024-08-08 MED ORDER — SODIUM CHLORIDE 0.9 % IV SOLN
INTRAVENOUS | Status: DC
  Filled 2024-08-08 (×2): qty 250

## 2024-08-08 MED ORDER — ESOMEPRAZOLE MAGNESIUM 40 MG PO CPDR: 40.0000 mg | DELAYED_RELEASE_CAPSULE | Freq: Two times a day (BID) | ORAL | 2 refills | Status: DC

## 2024-08-08 NOTE — Patient Instructions (Signed)
 VISIT SUMMARY:  Today, you were seen for a persistent cough and shortness of breath, which have not improved despite using various medications. You also have a history of esophageal hernia and gastroesophageal reflux disease (GERD), which may be contributing to your symptoms. Additionally, you are experiencing painful oral lesions related to your cancer treatment, and difficulty swallowing due to your esophageal hernia and GERD.  YOUR PLAN:  -CHRONIC COUGH AND SHORTNESS OF BREATH: Your persistent cough and shortness of breath have not improved with your current medications. We will provide you with a refill for your nebulizer solution and check your airway inflammation levels to better understand your symptoms.  -GASTROESOPHAGEAL REFLUX DISEASE WITH ESOPHAGEAL HERNIA: GERD is a condition where stomach acid frequently flows back into the tube connecting your mouth and stomach (esophagus). This can cause symptoms like heartburn and can contribute to your cough. We will switch your Protonix medication to a twice-daily regimen to better manage your reflux.  -ORAL MUCOSAL LESIONS SECONDARY TO CANCER TREATMENT: You have painful lesions in your mouth due to your cancer treatment, which make it difficult to eat and drink. Continue using lidocaine , baking soda, and salt rinses to manage the pain.  -DYSPHAGIA: Dysphagia is difficulty swallowing, which you are experiencing as a sensation of food and drink getting stuck. This may be related to your esophageal hernia and GERD. We will continue to monitor and manage these conditions to help alleviate your symptoms.  INSTRUCTIONS:  Please follow up with us  if your symptoms do not improve or if you experience any new or worsening symptoms. Continue using your medications as prescribed, and let us  know if you need any additional refills or adjustments.

## 2024-08-08 NOTE — Progress Notes (Signed)
 Subjective:    Patient ID: Monica Gonzalez, female    DOB: August 08, 1976, 48 y.o.   MRN: [id number]  Patient Care Team: Epifanio Alm SQUIBB, MD as PCP - General (Infectious Diseases) Maurie Rayfield BIRCH, RN as Oncology Nurse Navigator Babara Call, MD as Consulting Physician (Oncology) Lenn Aran, MD as Consulting Physician (Radiation Oncology) Georgina Shasta POUR, RN as Oncology Nurse Navigator  Chief Complaint  Patient presents with   Medical Management of Chronic Issues    BACKGROUND/INTERVAL:Patient is a 48 year old lifelong never smoker with a history as noted below, who presents for follow-up of shortness of breath and cough with occasional wheezing.  She was initially seen here on 04 July 2024.  HPI Discussed the use of AI scribe software for clinical note transcription with the patient, who gave verbal consent to proceed.  History of Present Illness   Monica Gonzalez is a 48 year old female with esophageal hernia who presents with a persistent cough.  She has been experiencing a persistent cough that has not improved despite various treatments, including Tessalon  Perles and corticosteroid. The cough is severe enough to prevent her from sleeping and causes significant shortness of breath, necessitating the use of rescue inhalers, Flovent, and Advair up to six times a day.  She is currently using a nebulizer but requires a refill for the solution. She also reports using Protonix for reflux and has a history of esophageal hernia noted on a previous CT scan. She experiences difficulty eating and drinking, with sensations of food and liquid getting stuck in her esophagus.  Additionally, she has lesions in her mouth related to her cancer treatment, for which she is using lidocaine , baking soda, and salt rinses. These lesions cause pain and burning, even with water , and exacerbate her throat pain when coughing.  She is being treated for breast cancer and anal carcinoma.  She is also being treated  for pulmonary embolism.    Review of Systems A 10 point review of systems was performed and it is as noted above otherwise negative.   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   Skin candidiasis 05/29/2024   Painful cervical range of motion 07/24/2023   Impaired range of motion of cervical spine 07/24/2023   History of Prednisone Allergy 07/04/2023    Class: History of   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, C5-6) 06/07/2023   Cervical sensory radiculopathy (Left) 06/07/2023   Chronic upper extremity pain (Left) 06/07/2023   Shoulder radicular pain (Left) 06/07/2023   Chronic pain 05/22/2023   Chronic shoulder pain (2ry area of Pain) (Left) 05/22/2023   Chronic low back pain (3ry area of Pain) (Bilateral) (R>L) w/o sciatica 05/22/2023   Cervicogenic headache (4th area of Pain) (Bilateral) 05/22/2023   Chronic feet pain (5th area of Pain) (Bilateral) 05/22/2023   Abnormal MRI, cervical spine (01/16/2023) 05/22/2023   DDD (degenerative disc disease), cervical 05/22/2023   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,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) 05/22/2023  Cervical facet hypertrophy 05/22/2023   Grade 1 Retrolisthesis of C4/C5 & C5/C6 05/22/2023   Hand fracture, sequela (Right) (06/09/2015) 05/22/2023   Chronic pain syndrome 05/21/2023   Pharmacologic therapy 05/21/2023   Disorder of skeletal system 05/21/2023   Problems influencing health  status 05/21/2023   Fibromyalgia 02/07/2023   Colles' fracture of radius, sequela (Right) 12/29/2022   Palpitations 07/23/2020   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 She describes that she used to take prednisone due to her bronchial asthma and respiratory problems, but later on she received some prednisone which into locations caused her to have significant swelling and shortness of breath. For that reason, she was told to avoid the use of prednisone.   Silicone Hives and Itching    Current Meds  Medication Sig   acetaminophen  (TYLENOL ) 325 MG tablet Take 2 tablets (650 mg total) by mouth every 8 (eight) hours as needed for mild pain (pain score 1-3).   albuterol  (PROVENTIL ) (2.5 MG/3ML) 0.083% nebulizer solution Take 2.5 mg by nebulization every 6 (six) hours as needed for shortness of breath or wheezing.   albuterol  (VENTOLIN  HFA) 108 (90 Base) MCG/ACT inhaler Inhale 2 puffs into the lungs every 4 (four) hours as needed for wheezing or shortness of breath.   ascorbic acid (VITAMIN C) 500 MG tablet Take 500 mg by mouth daily.   B Complex-C (SUPER B COMPLEX PO) Take by mouth daily.   benzonatate  (TESSALON ) 200 MG capsule Take 1 capsule (200 mg total) by mouth 3 (three) times daily as needed for cough.   BIOTIN PO Take by mouth daily.   busPIRone  (BUSPAR ) 5 MG tablet Take 5 mg by  mouth 2 (two) times daily.   cholecalciferol (VITAMIN D3) 25 MCG (1000 UNIT) tablet Take 1,000 Units by mouth daily.   clonazePAM (KLONOPIN) 0.5 MG tablet Take 0.5 mg by mouth at bedtime as needed (anxiety/sleep).   DULoxetine  (CYMBALTA ) 30 MG capsule Take 30 mg by mouth at bedtime.   enoxaparin  (LOVENOX ) 40 MG/0.4ML injection Inject 0.875 mLs (87.5 mg total) into the skin every 12 (twelve) hours.   ferrous sulfate 325 (65 FE) MG tablet Take 325 mg by mouth daily.    gabapentin  (NEURONTIN ) 300 MG capsule Take 600 mg by mouth 2 (two) times daily.   ipratropium (ATROVENT  HFA) 17 MCG/ACT inhaler Inhale 2 puffs into the lungs every 6 (six) hours as needed for wheezing (COUGH).   lidocaine  (LIDODERM ) 5 % 1 patch daily.   lidocaine  (XYLOCAINE ) 2 % solution Use as directed 15 mLs in the mouth or throat every 4 (four) hours as needed for mouth pain.   lidocaine -prilocaine  (EMLA ) cream Apply to affected area once   linaclotide (LINZESS) 145 MCG CAPS capsule Take 145 mcg by mouth daily before breakfast.   magic mouthwash (multi-ingredient) oral suspension Swish/Spit 5-10 mLs by mouth 4 (four) times daily as needed for mouth pain.  montelukast  (SINGULAIR ) 10 MG tablet Take 1 tablet (10 mg total) by mouth See admin instructions. Take 1 tablet on the day prior to chemotherapy and take 1 tablet daily for 2 days after chemotherapy.   Multiple Vitamin (MULTIVITAMIN WITH MINERALS) TABS tablet Take 1 tablet by mouth daily.   [Paused] naproxen  (EC NAPROSYN ) 500 MG EC tablet Take 1 tablet (500 mg total) by mouth 2 (two) times daily with a meal.   nystatin  (MYCOSTATIN ) 100000 UNIT/ML suspension Take 5 mLs (500,000 Units total) by mouth 4 (four) times daily. Swish and swallow   nystatin  (MYCOSTATIN /NYSTOP ) powder Apply 1 Application topically 2 (two) times daily.   ondansetron  (ZOFRAN ) 8 MG tablet Take 1 tablet (8 mg total) by mouth every 8 (eight) hours as needed for nausea or vomiting.   potassium chloride  SA  (KLOR-CON  M) 20 MEQ tablet Take 1 tablet (20 mEq total) by mouth daily.   prochlorperazine  (COMPAZINE ) 10 MG tablet Take 1 tablet (10 mg total) by mouth every 6 (six) hours as needed for nausea or vomiting.   promethazine -dextromethorphan  (PROMETHAZINE -DM) 6.25-15 MG/5ML syrup Take 5 mLs by mouth 4 (four) times daily as needed for cough.   sucralfate  (CARAFATE ) 1 g tablet Take 1 tablet (1 g total) by mouth 4 (four) times daily -  with meals and at bedtime. Dissolve in water    traMADol  (ULTRAM ) 50 MG tablet Take 1 tablet (50 mg total) by mouth every 8 (eight) hours as needed.   traZODone  (DESYREL ) 50 MG tablet Take 50 mg by mouth at bedtime.   Turmeric (QC TUMERIC COMPLEX PO) Take by mouth daily.   [DISCONTINUED] pantoprazole (PROTONIX) 40 MG tablet Take by mouth.   budesonide  (PULMICORT ) 0.25 MG/2ML nebulizer solution Take 2 mLs (0.25 mg total) by nebulization 2 (two) times daily.   esomeprazole  (NEXIUM ) 40 MG capsule Take 1 capsule (40 mg total) by mouth 2 (two) times daily before a meal.   ipratropium-albuterol  (DUONEB) 0.5-2.5 (3) MG/3ML SOLN Take 3 mLs by nebulization 4 (four) times daily.     There is no immunization history on file for this patient.      Objective:     BP 120/88   Pulse 74   Temp 97.7 F (36.5 C) (Temporal)   Ht 4' 11 (1.499 m)   Wt 191 lb 9.6 oz (86.9 kg)   LMP 06/26/2024 (Approximate) Comment: NEGATIVE PREG TEST 07/25/2024  SpO2 100%   BMI 38.70 kg/m   SpO2: 100 %  GENERAL: Well-developed, obese woman, no acute distress.  Fully ambulatory. HEAD: Normocephalic, atraumatic.  EYES: Pupils equal, round, reactive to light.  No scleral icterus.  MOUTH: Poor dentition, some missing teeth.  Oral mucosa moist.  No thrush. NECK: Supple. No thyromegaly. Trachea midline. No JVD.  No adenopathy. PULMONARY: Good air entry bilaterally.  Coarse, otherwise, no adventitious sounds. CARDIOVASCULAR: S1 and S2. Regular rate and rhythm.  No rubs, murmurs or gallops  heard. ABDOMEN: Obese otherwise benign MUSCULOSKELETAL: No joint deformity, no clubbing, no edema.  NEUROLOGIC: No overt focal deficit, no gait disturbance, speech is fluent. SKIN: Intact,warm,dry. PSYCH: Mood and behavior normal.  Lab Results  Component Value Date   NITRICOXIDE 8 08/08/2024  *This result suggests low (<25) Type II (T2) airway inflammation indicating a low likelihood of active T2-driven airway inflammation.  In a patient with active T2 driven asthma management it suggests good control.       Assessment & Plan:     ICD-10-CM   1. Subacute cough  R05.2  2. Cough variant asthma  J45.991     3. GERD without esophagitis  K21.9     4. Acute pulmonary embolism, unspecified pulmonary embolism type, unspecified whether acute cor pulmonale present (HCC)  I26.99     5. Anal squamous cell carcinoma (HCC)  C21.0     6. Invasive carcinoma of breast (HCC)  C50.919      Orders Placed This Encounter  Procedures   Nitric oxide    Meds ordered this encounter  Medications   esomeprazole  (NEXIUM ) 40 MG capsule    Sig: Take 1 capsule (40 mg total) by mouth 2 (two) times daily before a meal.    Dispense:  60 capsule    Refill:  2   ipratropium-albuterol  (DUONEB) 0.5-2.5 (3) MG/3ML SOLN    Sig: Take 3 mLs by nebulization 4 (four) times daily.    Dispense:  360 mL    Refill:  3   budesonide  (PULMICORT ) 0.25 MG/2ML nebulizer solution    Sig: Take 2 mLs (0.25 mg total) by nebulization 2 (two) times daily.    Dispense:  60 mL    Refill:  12   guaiFENesin -codeine  100-10 MG/5ML syrup    Sig: Take 5 mLs by mouth every 6 (six) hours as needed for cough.    Dispense:  120 mL    Refill:  0   Discussion:    Chronic cough and shortness of breath Persistent cough and shortness of breath despite prescribed medications, including Flovent and Advair inhalers up to six times daily. The cough disrupts sleep. Consideration of inhaler overuse contributing to symptoms. - Provide  nebulizer solution and refills. - Check airway inflammation levels: No evidence of type II inflammation present. - Patient was instructed on PROPER use of the inhalers including appropriate dosing frequency.  She has been overusing inhalers. - Provided codeine  containing cough syrup, limit to use at bedtime.  Gastroesophageal reflux disease with esophageal hernia GERD with esophageal hernia confirmed on previous CT scan. Symptoms may contribute to cough. Current management includes Protonix. - Switch to a twice-daily Nexium  regimen for reflux management.  Oral mucosal lesions secondary to cancer treatment Oral mucosal lesions cause significant discomfort, including burning sensation with water  and difficulty eating and drinking.  Dysphagia Difficulty swallowing with sensation of food and drink getting stuck, possibly related to esophageal hernia and GERD.     Advised if symptoms do not improve or worsen, to please contact office for sooner follow up or seek emergency care.    I spent 40 minutes of dedicated to the care of this patient on the date of this encounter to include pre-visit review of records, face-to-face time with the patient discussing conditions above, post visit ordering of testing, clinical documentation with the electronic health record, making appropriate referrals as documented, and communicating necessary findings to members of the patients care team.     C. Leita Sanders, MD Advanced Bronchoscopy [facility]    *This note was generated using voice recognition software/Dragon and/or AI transcription program.  Despite best efforts to proofread, errors can occur which can change the meaning. Any transcriptional errors that result from this process are unintentional and may not be fully corrected at the time of dictation.

## 2024-08-08 NOTE — Patient Instructions (Signed)
 Ingredients:  1 teaspoon baking soda and 8 ounces (240 ml) warm water .  Instructions:  Mix the baking soda and water  in a small bowl or glass.  Stir until the baking soda is dissolved.  Rinse your mouth thoroughly with the mixture for 30 seconds to 1 minute.  Spit out the mixture and rinse your mouth with clean water .

## 2024-08-08 NOTE — Progress Notes (Signed)
 [facility] at [facility] [phone] [phone]) [phone]  Patient Care Team: Epifanio Alm SQUIBB, MD as PCP - General (Infectious Diseases) Maurie Rayfield BIRCH, RN as Oncology Nurse Navigator Babara Call, MD as Consulting Physician (Oncology) Lenn Aran, MD as Consulting Physician (Radiation Oncology) Georgina Shasta POUR, RN as Oncology Nurse Navigator   NAME OF PATIENT: Monica Gonzalez  [id number]  [birth year]   DATE OF VISIT: 08/08/24  REASON FOR CONSULT: Monica Gonzalez is a 48 y.o. female with multiple medical problems including right triple negative breast cancer status postlumpectomy, stage III anal cancer.   INTERVAL HISTORY: Patient received day 1, cycle 1 concurrent chemoradiation with 5-FU and Mitomycin -C on 07/31/2024.  She presents to [facility] today for evaluation of painful oral lesions.  Patient reports difficulty with mouth pain over the past 5 days.  She now also has sore throat.  Has had minimal oral intake.  Has been using Magic mouthwash with little improvement.  Requests viscous lidocaine , which she has used in the past with more efficacy.  Denies any neurologic complaints. Denies recent fevers or illnesses. Denies any easy bleeding or bruising. Reports good appetite and denies weight loss. Denies chest pain. Denies any nausea, vomiting, constipation, or diarrhea. Denies urinary complaints. Patient offers no further specific complaints today.   PAST MEDICAL HISTORY: Past Medical History:  Diagnosis Date   Anemia    Anginal pain    Anxiety    Arthritis    Asthma    Cancer (HCC)    Chronic pain syndrome 05/21/2023   DDD (degenerative disc disease), cervical 05/22/2023   Deep venous thrombosis (HCC)    Depression    Dyspnea    Fibromyalgia    GERD (gastroesophageal reflux disease)    Headache    Heart murmur    History of hiatal hernia    History of kidney stones    Lupus    Malignant neoplasm of upper-outer  quadrant of right female breast, unspecified estrogen receptor status (HCC) 2025   Pneumonia    PTSD (post-traumatic stress disorder)    Skin candidiasis 05/29/2024   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    PAST SURGICAL HISTORY:  Past Surgical History:  Procedure Laterality Date   BREAST BIOPSY Right 06/18/2024   US  RT BREAST BX W LOC DEV 1ST LESION IMG BX SPEC US  GUIDE 06/18/2024 [facility]   BREAST BIOPSY Right 06/18/2024   US  RT BREAST BX W LOC DEV EA ADD LESION IMG BX SPEC US  GUIDE 06/18/2024 [facility]   BREAST BIOPSY Right 06/18/2024   US  RT BREAST BX W LOC DEV EA ADD LESION IMG BX SPEC US  GUIDE 06/18/2024 [facility]   BREAST BIOPSY Right 07/05/2024   US  RT BREAST SAVI/RF TAG 1ST LESION US  GUIDE 07/05/2024 [facility]   BREAST BIOPSY Right 07/05/2024   US  RT BREAST SAVI/RF TAG EA ADD'L LESION US  GUIDE 07/05/2024 [facility]   BREAST LUMPECTOMY Right 07/25/2024   Procedure: BREAST LUMPECTOMY;  Surgeon: Tye Millet, DO;  Location: [facility];  Service: General;  Laterality: Right;   BREAST LUMPECTOMY WITH RADIO FREQUENCY LOCALIZER Right 07/08/2024   Procedure: BREAST LUMPECTOMY WITH RADIO FREQUENCY LOCALIZER;  Surgeon: Tye Millet, DO;  Location: [facility];  Service: General;  Laterality: Right;   COLONOSCOPY WITH PROPOFOL  N/A 02/17/2023   Procedure: COLONOSCOPY WITH PROPOFOL ;  Surgeon: Maryruth Ole DASEN, MD;  Location: [facility];  Service: Endoscopy;  Laterality: N/A;   ESOPHAGOGASTRODUODENOSCOPY (EGD) WITH PROPOFOL  N/A 02/17/2023   Procedure:  ESOPHAGOGASTRODUODENOSCOPY (EGD) WITH PROPOFOL ;  Surgeon: Maryruth Ole DASEN, MD;  Location: [facility];  Service: Endoscopy;  Laterality: N/A;   EXTRACORPOREAL SHOCK WAVE LITHOTRIPSY     PORTACATH PLACEMENT Right 07/08/2024   Procedure: INSERTION, TUNNELED CENTRAL VENOUS DEVICE, WITH PORT;  Surgeon: Tye Millet, DO;  Location: [facility];  Service: General;  Laterality: Right;   RECTAL EXAM  UNDER ANESTHESIA N/A 05/16/2024   Procedure: EXAM UNDER ANESTHESIA, RECTUM, INCISIONAL BIOPSY OF ANAL MASS;  Surgeon: Tye Millet, DO;  Location: [facility];  Service: General;  Laterality: N/A;   SENTINEL NODE BIOPSY Right 07/08/2024   Procedure: BIOPSY, LYMPH NODE, SENTINEL;  Surgeon: Tye Millet, DO;  Location: [facility];  Service: General;  Laterality: Right;   TONSILLECTOMY     TUBAL LIGATION     VULVECTOMY N/A 07/29/2020   Procedure: WIDE EXCISION VULVECTOMY;  Surgeon: Mancil Barter, MD;  Location: [facility];  Service: Gynecology;  Laterality: N/A;    HEMATOLOGY/ONCOLOGY HISTORY:  Oncology History  Anal squamous cell carcinoma (HCC)  05/29/2024 Initial Diagnosis   Anal squamous cell carcinoma   Patient has noticed a hard mass at the anal area which she initially thought was a hemorrhoid.  The mass has been present for more than a month and appears to be enlarging. Patient was referred to establish care with surgeon Dr. Tye  05/16/2024, incisional biopsy of anal mass showed 1. Anus, biopsy, mass :       - INVASIVE MODERATELY DIFFERENTIATED SQUAMOUS CELL CARCINOMA  06/19/2024, left inguinal adenopathy biopsy showed Metastatic moderately differentiated squamous cell carcinoma.  '    05/29/2024 Cancer Staging   Staging form: Anus, AJCC V9 - Clinical stage from 05/29/2024: Stage IIIA (cT3, cN1a, cM0) - Signed by Babara Call, MD on 06/26/2024 Stage prefix: Initial diagnosis   06/05/2024 Imaging   CT chest abdomen pelvis with contrast  Slight thickening in the area of the anal canal. Please correlate for history of neoplasm. There is a pathologic enlarged left inguinal node. There is a borderline right-sided node.   No additional areas of abnormal nodal enlargement or other aggressive appearing mass lesion at this time.   Fatty liver infiltration with known segment 7 hepatic hemangioma.   Of note there is a right-sided upper breast mass which was not clearly seen on prior CT scan.  Please correlate for any prior study or if needed diagnostic mammographic evaluation and possible ultrasound when appropriate.     06/12/2024 Imaging   CT chest abdomen pelvis with contrast showed Slight thickening in the area of the anal canal. Please correlate for history of neoplasm. There is a pathologic enlarged left inguinal node. There is a borderline right-sided node.   No additional areas of abnormal nodal enlargement or other aggressive appearing mass lesion at this time.   Fatty liver infiltration with known segment 7 hepatic hemangioma.   Of note there is a right-sided upper breast mass which was not clearly seen on prior CT scan. Please correlate for any prior study or if needed diagnostic mammographic evaluation and possible ultrasound when appropriate.   06/19/2024 Procedure   1. Lymph node, biopsy, left inguinal adenopathy :       -  METASTATIC MODERATELY DIFFERENTIATED SQUAMOUS CELL CARCINOMA    07/01/2024 Imaging   PET scan showed 1. Hypermetabolic anal mass with hypermetabolic left inguinal adenopathy, compatible with anorectal carcinoma. 2. Hypermetabolic right breast nodule with recent biopsies on 06/18/2024. Please correlate with pathology report.   07/31/2024 -  Chemotherapy   Patient  is on Treatment Plan : ANUS Mitomycin  D1,28 + 5FU D1-4, 28-31 q32d     Invasive carcinoma of breast (HCC)  06/12/2024 Imaging   CT chest abdomen pelvis with contrast showed Slight thickening in the area of the anal canal. Please correlate for history of neoplasm. There is a pathologic enlarged left inguinal node. There is a borderline right-sided node.   No additional areas of abnormal nodal enlargement or other aggressive appearing mass lesion at this time.   Fatty liver infiltration with known segment 7 hepatic hemangioma.   Of note there is a right-sided upper breast mass which was not clearly seen on prior CT scan. Please correlate for any prior study or if needed  diagnostic mammographic evaluation and possible ultrasound when appropriate.   06/17/2024 Mammogram   1. There is a suspicious 17 mm mass at the site of CT and mammographic concern in the RIGHT breast. It demonstrates associated pleomorphic calcifications. Recommend ultrasound-guided biopsy for definitive characterization. 2. There is incidental sonographic note of a 7 mm mass in the RIGHT breast at 12 o'clock and a 6 mm non mass area at 9 o'clock. Recommend ultrasound-guided biopsy of these 2 areas for definitive characterization given suspicious appearance of the dominant mass. 3. No suspicious RIGHT axillary adenopathy. 4. No mammographic evidence of malignancy in the LEFT breast.   06/18/2024 Cancer Staging   Staging form: Breast, AJCC 8th Edition - Clinical stage from 06/18/2024: Stage IB (cT1b, cN0, cM0, G3, ER-, PR-, HER2-) - Signed by Babara Call, MD on 06/26/2024 Stage prefix: Initial diagnosis Histologic grading system: 3 grade system   06/18/2024 Initial Diagnosis   Invasive carcinoma of breast [facility])  Patient with right breast biopsy.  Pathology showed 1. Breast, right, needle core biopsy, 12:00 12cmfn (heart clip) :      INVASIVE DUCTAL CARCINOMA      TUBULE FORMATION: SCORE 3      NUCLEAR PLEOMORPHISM: SCORE 3      MITOTIC COUNT: SCORE 2      TOTAL SCORE: 8      OVERALL GRADE: 3      LYMPHOVASCULAR INVASION: NOT IDENTIFIED      CANCER LENGTH: 1.0 CM      CALCIFICATIONS: NOT IDENTIFIED      OTHER FINDINGS: NONE      ER negative, PR negative, HER2 negative (IHC 0) Ki-67 95%.       2. Breast, right, needle core biopsy, 12:00 8cmfn (coil clip) :      INVASIVE DUCTAL CARCINOMA      DUCTAL CARCINOMA IN SITU, SOLID, HIGH NUCLEAR GRADE WITH NECROSIS      TUBULE FORMATION: SCORE 3      NUCLEAR PLEOMORPHISM: SCORE 3      MITOTIC COUNT: SCORE 2      TOTAL SCORE: 8      OVERALL GRADE: 3      LYMPHOVASCULAR INVASION: NOT IDENTIFIED      CANCER LENGTH: 0.3 CM      CALCIFICATIONS:  NOT IDENTIFIED      OTHER FINDINGS: NONE            3. Breast, right, needle core biopsy, 9:00 12cmfn (venus) :      BENIGN BREAST TISSUE WITH DENSE STROMAL FIBROSIS.      NEGATIVE FOR ATYPIA OR MALIGNANCY.    07/08/2024 Surgery   Patient went right breast lumpectomy and sentinel lymph node biopsy.  1. Breast, lumpectomy, Right breast mass :      - INVASIVE CARCINOMA OF  NO SPECIAL TYPE (DUCTAL), MULTIFOCAL.      - DUCTAL CARCINOMA IN SITU (DCIS).      - SEE CANCER SUMMARY AND NOTE BELOW.      - TWO BIOPSY SITES WITH CORRESPONDING HEART AND COIL CLIPS.      - TWO SAVI SCOUT TAGS.       2. Lymph node, sentinel, biopsy, Right :      - MICROMETASTATIC CARCINOMA INVOLVES ONE OF FIVE LYMPH NODES (1MI/5).      - SEE CANCER SUMMARY AND NOTE BELOW.       3. Breast, excision, Right medial posterior margin :      - BENIGN BREAST TISSUE.      - NEGATIVE FOR ATYPIA AND MALIGNANCY.      - SEE CANCER SUMMARY BELOW.   TUMOR Histologic Type: Invasive carcinoma of no special type (ductal) Histologic Grade (Nottingham Histologic Score) Glandular (Acinar)/Tubular Differentiation: 3 Nuclear Pleomorphism: 3 Mitotic Rate: 3 Overall Grade: 3 Tumor Size: Greatest dimension of largest invasive focus: 19 mm Ductal Carcinoma In Situ (DCIS): Present, high-grade with comedonecrosis Lymphatic and/or Vascular Invasion: Present, extensive (LVI in 2 or more blocks)  Treatment Effect in the Breast: No known presurgical therapy MARGINS Margin Status for Invasive Carcinoma: Invasive Carcinoma involves inferior (unifocal) and superior (unifocal) margins and is 0.5 mm to anterior margin Margin Status for DCIS: All margins negative for DCIS Distance from DCIS to closest margin: 0.5 mm Specify closest margin: Inferior REGIONAL LYMPH NODES Regional Lymph Node Status: Tumor present in regional lymph node(s) Number of Lymph Nodes with Macrometastases (greater than 2 mm): 0 Number of Lymph Nodes with  Micrometastases (greater than 0.2 mm to 2 mm and/or greater than 200 cells): 1 Number of Lymph Nodes with Isolated Tumor Cells (0.2 mm or less OR 200 cells or less): 0 Size of Largest Metastatic Deposit: 1.9 mm Extranodal Extension: Not identified Total Number of Lymph Nodes Examined (sentinel and non-sentinel): 5 Number of Sentinel Nodes Examined: 5 DISTANT METASTASIS Distant Site(s) Involved, if applicable: Not applicable PATHOLOGIC STAGE CLASSIFICATION (pTNM, AJCC 8th Edition): Modified Classification: Not applicable pT Category: pT1c T Suffix: (m) multiple primary synchronous tumors in a single organ pN Category: pN44mi N Suffix: (sn) pM Category: Not applicable SPECIAL STUDIES Breast Biomarker Testing Performed on Previous Biopsy: [id number] (heart) Estrogen Receptor: Negative (0%) Progesterone Receptor: Negative (0%) HER2 IHC: Negative (0) Proliferation Marker Ki67: 95%   07/22/2024 Cancer Staging   Staging form: Breast, AJCC 8th Edition - Pathologic stage from 07/22/2024: Stage IB (pT1c, pN32mi(sn), cM0, G3, ER-, PR-, HER2-) - Signed by Babara Call, MD on 07/22/2024 Stage prefix: Initial diagnosis Method of lymph node assessment: Sentinel lymph node biopsy Multigene prognostic tests performed: None Histologic grading system: 3 grade system   07/25/2024 Surgery   Patient reports additional excision for positive margin.  1. Breast, excision, superior margin of right breast - RESIDUAL INVASIVE DUCTAL CARCINOMA, COMPLETELY EXCISED. - CARCINOMA PRESENT 2 MM FROM THE POSTERIOR MARGIN AND 4 MM FROM THE ANTERIOR AND SUPERIOR MARGINS. 2. Breast, excision, inferior margin of right breast - RESIDUAL INVASIVE DUCTAL CARCINOMA, COMPLETELY EXCISED. - CARCINOMA PRESENT 1 MM FROM THE NEW INFERIOR MARGIN.     ALLERGIES:  is allergic to prednisone.  MEDICATIONS:  Current Outpatient Medications  Medication Sig Dispense Refill   albuterol  (PROVENTIL ) (2.5 MG/3ML) 0.083% nebulizer solution  Take 2.5 mg by nebulization every 6 (six) hours as needed for shortness of breath or wheezing.     albuterol  (VENTOLIN  HFA) 108 (90 Base)  MCG/ACT inhaler Inhale 2 puffs into the lungs every 4 (four) hours as needed for wheezing or shortness of breath. 8.5 g 2   ascorbic acid (VITAMIN C) 500 MG tablet Take 500 mg by mouth daily.     B Complex-C (SUPER B COMPLEX PO) Take by mouth daily.     benzonatate  (TESSALON ) 200 MG capsule Take 1 capsule (200 mg total) by mouth 3 (three) times daily as needed for cough. 30 capsule 1   BIOTIN PO Take by mouth daily.     busPIRone  (BUSPAR ) 5 MG tablet Take 5 mg by mouth 2 (two) times daily.     cholecalciferol (VITAMIN D3) 25 MCG (1000 UNIT) tablet Take 1,000 Units by mouth daily.     clonazePAM (KLONOPIN) 0.5 MG tablet Take 0.5 mg by mouth at bedtime as needed (anxiety/sleep).     DULoxetine  (CYMBALTA ) 30 MG capsule Take 30 mg by mouth at bedtime.     enoxaparin  (LOVENOX ) 40 MG/0.4ML injection Inject 0.875 mLs (87.5 mg total) into the skin every 12 (twelve) hours. 52.5 mL 0   ferrous sulfate 325 (65 FE) MG tablet Take 325 mg by mouth daily.      gabapentin  (NEURONTIN ) 300 MG capsule Take 600 mg by mouth 2 (two) times daily.     ipratropium (ATROVENT  HFA) 17 MCG/ACT inhaler Inhale 2 puffs into the lungs every 6 (six) hours as needed for wheezing (COUGH). 12.9 g 1   lidocaine  (LIDODERM ) 5 % 1 patch daily.     lidocaine -prilocaine  (EMLA ) cream Apply to affected area once 30 g 3   linaclotide (LINZESS) 145 MCG CAPS capsule Take 145 mcg by mouth daily before breakfast.     magic mouthwash (multi-ingredient) oral suspension Swish/Spit 5-10 mLs by mouth 4 (four) times daily as needed for mouth pain. 480 mL 1   montelukast  (SINGULAIR ) 10 MG tablet Take 1 tablet (10 mg total) by mouth See admin instructions. Take 1 tablet on the day prior to chemotherapy and take 1 tablet daily for 2 days after chemotherapy. 30 tablet 0   Multiple Vitamin (MULTIVITAMIN WITH MINERALS)  TABS tablet Take 1 tablet by mouth daily.     [Paused] naproxen  (EC NAPROSYN ) 500 MG EC tablet Take 1 tablet (500 mg total) by mouth 2 (two) times daily with a meal. 60 tablet 0   nystatin  (MYCOSTATIN ) 100000 UNIT/ML suspension Take 5 mLs (500,000 Units total) by mouth 4 (four) times daily. Swish and swallow 473 mL 1   nystatin  (MYCOSTATIN /NYSTOP ) powder Apply 1 Application topically 2 (two) times daily. 20 g 0   ondansetron  (ZOFRAN ) 8 MG tablet Take 1 tablet (8 mg total) by mouth every 8 (eight) hours as needed for nausea or vomiting. 30 tablet 1   pantoprazole (PROTONIX) 40 MG tablet Take by mouth.     potassium chloride  SA (KLOR-CON  M) 20 MEQ tablet Take 1 tablet (20 mEq total) by mouth daily. 3 tablet 0   prochlorperazine  (COMPAZINE ) 10 MG tablet Take 1 tablet (10 mg total) by mouth every 6 (six) hours as needed for nausea or vomiting. 30 tablet 1   promethazine -dextromethorphan  (PROMETHAZINE -DM) 6.25-15 MG/5ML syrup Take 5 mLs by mouth 4 (four) times daily as needed for cough. 118 mL 0   traMADol  (ULTRAM ) 50 MG tablet Take 1 tablet (50 mg total) by mouth every 8 (eight) hours as needed. 15 tablet 0   traZODone  (DESYREL ) 50 MG tablet Take 50 mg by mouth at bedtime.     Turmeric (QC TUMERIC COMPLEX PO) Take  by mouth daily.     No current facility-administered medications for this visit.   Facility-Administered Medications Ordered in Other Visits  Medication Dose Route Frequency Provider Last Rate Last Admin   0.9 %  sodium chloride  infusion   Intravenous Continuous Tandy Grawe, Fonda SAUNDERS, NP       potassium chloride  10 mEq in 100 mL IVPB  10 mEq Intravenous Once Rosaleah Person, Fonda SAUNDERS, NP        VITAL SIGNS: BP 111/78 (BP Location: Left Arm, Patient Position: Sitting, Cuff Size: Large)   Pulse 82   Temp (!) 97.3 F (36.3 C) (Tympanic)   Resp 16   Wt 188 lb (85.3 kg)   LMP 06/26/2024 (Approximate) Comment: NEGATIVE PREG TEST 07/25/2024  SpO2 100%   BMI 37.97 kg/m  Filed Weights   08/08/24  0843  Weight: 188 lb (85.3 kg)    Estimated body mass index is 37.97 kg/m as calculated from the following:   Height as of 07/29/24: 4' 11 (1.499 m).   Weight as of this encounter: 188 lb (85.3 kg).  LABS: CBC:    Component Value Date/Time   WBC 5.4 08/07/2024 1511   WBC 16.4 (H) 07/30/2024 0330   HGB 12.3 08/07/2024 1511   HGB 14.1 03/06/2015 1307   HCT 38.2 08/07/2024 1511   HCT 42.4 03/06/2015 1307   PLT 282 08/07/2024 1511   PLT 252 03/06/2015 1307   MCV 94.1 08/07/2024 1511   MCV 90 03/06/2015 1307   NEUTROABS 4.1 08/07/2024 1511   NEUTROABS 4.8 03/06/2015 1307   LYMPHSABS 0.9 08/07/2024 1511   LYMPHSABS 0.5 (L) 03/06/2015 1307   MONOABS 0.2 08/07/2024 1511   MONOABS 0.4 03/06/2015 1307   EOSABS 0.1 08/07/2024 1511   EOSABS 0.0 03/06/2015 1307   BASOSABS 0.1 08/07/2024 1511   BASOSABS 0.1 03/06/2015 1307   Comprehensive Metabolic Panel:    Component Value Date/Time   NA 134 (L) 08/07/2024 1511   NA 141 03/06/2015 1307   K 3.3 (L) 08/07/2024 1511   K 3.2 (L) 03/06/2015 1307   CL 101 08/07/2024 1511   CL 107 03/06/2015 1307   CO2 25 08/07/2024 1511   CO2 25 03/06/2015 1307   BUN 9 08/07/2024 1511   BUN 5 (L) 03/06/2015 1307   CREATININE 0.95 08/07/2024 1511   CREATININE 0.89 03/06/2015 1307   GLUCOSE 135 (H) 08/07/2024 1511   GLUCOSE 83 03/06/2015 1307   CALCIUM 8.7 (L) 08/07/2024 1511   CALCIUM 8.8 (L) 03/06/2015 1307   AST 43 (H) 08/07/2024 1511   ALT 57 (H) 08/07/2024 1511   ALT 14 03/06/2015 1307   ALKPHOS 93 08/07/2024 1511   ALKPHOS 46 03/06/2015 1307   BILITOT 1.0 08/07/2024 1511   PROT 6.9 08/07/2024 1511   PROT 7.4 03/06/2015 1307   ALBUMIN 3.7 08/07/2024 1511   ALBUMIN 4.2 03/06/2015 1307    RADIOGRAPHIC STUDIES: CT Angio Chest PE W and/or Wo Contrast Addendum Date: 07/29/2024 ADDENDUM REPORT: 07/29/2024 18:47 ADDENDUM: The original report was by Dr. Ryan Salvage. The following addendum is by Dr. Ryan Salvage: Critical  Value/emergent results were called by telephone at the time of interpretation on 07/29/2024 at 6:47 pm to provider MARK QUALE , who verbally acknowledged these results. Electronically Signed   By: Ryan Salvage M.D.   On: 07/29/2024 18:47   Result Date: 07/29/2024 CLINICAL DATA:  Deep vein thrombosis, dyspnea, high probability of pulmonary embolus. Recent diagnosis of anal squamous cell carcinoma. * Tracking Code: BO * EXAM:  CT ANGIOGRAPHY CHEST WITH CONTRAST TECHNIQUE: Multidetector CT imaging of the chest was performed using the standard protocol during bolus administration of intravenous contrast. Multiplanar CT image reconstructions and MIPs were obtained to evaluate the vascular anatomy. RADIATION DOSE REDUCTION: This exam was performed according to the departmental dose-optimization program which includes automated exposure control, adjustment of the mA and/or kV according to patient size and/or use of iterative reconstruction technique. CONTRAST:  75mL OMNIPAQUE  IOHEXOL  350 MG/ML SOLN COMPARISON:  06/30/2024 FINDINGS: Cardiovascular: Right Port-A-Cath tip: Right internal jugular vein just above the brachiocephalic confluence. Surrounding hypodensity, persistent thrombus in the right internal jugular vein not excluded. Acute subsegmental pulmonary embolus medially in the left lower lobe as on images 180-201 of series 6. No lobar or larger embolus identified. Mediastinum/Nodes: Unremarkable Lungs/Pleura: Mild subsegmental atelectasis in the right lower lobe. Upper Abdomen: Unremarkable Musculoskeletal: Unremarkable Review of the MIP images confirms the above findings. IMPRESSION: 1. Acute subsegmental pulmonary embolus medially in the left lower lobe. No lobar or larger embolus identified. 2. Right Port-A-Cath tip: Right internal jugular vein just above the brachiocephalic confluence. Surrounding hypodensity in the right internal jugular vein, persistent thrombus in the right internal jugular vein (as  shown on ultrasound exam of 07/23/2024) not excluded. 3. Mild subsegmental atelectasis in the right lower lobe. Radiology assistant personnel have been notified to put me in telephone contact with the referring physician or the referring physician's clinical representative in order to discuss these findings. Once this communication is established I will issue an addendum to this report for documentation purposes. Electronically Signed: By: Ryan Salvage M.D. On: 07/29/2024 18:38   DG Chest 2 View Result Date: 07/29/2024 CLINICAL DATA:  Shortness of breath. EXAM: CHEST - 2 VIEW COMPARISON:  07/08/2024, CT 06/30/2024 FINDINGS: Stable positioning of right chest port with tip overlying the upper SVC. Normal heart size with stable mediastinal contours. No focal airspace disease, pleural effusion, pulmonary edema or pneumothorax. No acute osseous abnormalities are seen. IMPRESSION: No acute chest findings. Electronically Signed   By: Andrea Gasman M.D.   On: 07/29/2024 16:39   US  Venous Img Upper Uni Right Result Date: 07/23/2024 CLINICAL DATA:  Right neck pain at Port-A-Cath site EXAM: Right UPPER EXTREMITY VENOUS DOPPLER ULTRASOUND TECHNIQUE: Gray-scale sonography with graded compression, as well as color Doppler and duplex ultrasound were performed to evaluate the upper extremity deep venous system from the level of the subclavian vein and including the jugular, axillary, basilic, radial, ulnar and upper cephalic vein. Spectral Doppler was utilized to evaluate flow at rest and with distal augmentation maneuvers. COMPARISON:  None Available. FINDINGS: Contralateral Subclavian Vein: Respiratory phasicity is normal and symmetric with the symptomatic side. No evidence of thrombus. Normal compressibility. Internal Jugular Vein: Positive for acute occlusive thrombus. Incompletely compressible Subclavian Vein: Positive for acute occlusive thrombus. Axillary Vein: No evidence of thrombus. Normal compressibility,  respiratory phasicity and response to augmentation. Cephalic Vein: No evidence of thrombus. Normal compressibility, respiratory phasicity and response to augmentation. Basilic Vein: No evidence of thrombus. Normal compressibility, respiratory phasicity and response to augmentation. Brachial Veins: No evidence of thrombus. Normal compressibility, respiratory phasicity and response to augmentation. Radial Veins: No evidence of thrombus. Normal compressibility, respiratory phasicity and response to augmentation. Ulnar Veins: No evidence of thrombus. Normal compressibility, respiratory phasicity and response to augmentation. IMPRESSION: Positive for acute occlusive DVT involving the right internal jugular and subclavian veins. Critical Value/emergent results were called by telephone at the time of interpretation on 07/23/2024 at 3:38 pm to provider LAUREN  ALLEN , who verbally acknowledged these results. Electronically Signed   By: Luke Bun M.D.   On: 07/23/2024 15:39    PERFORMANCE STATUS (ECOG) : 1 - Symptomatic but completely ambulatory  Review of Systems Unless otherwise noted, a complete review of systems is negative.  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  Mucositis -secondary to chemotherapy.  Patient not having any improvement with Magic mouthwash.  Will start sucralfate .  Also recommended baking soda salt rinses several times daily.  Discussed oral hygiene.  Will prescribe viscous lidocaine  for pain.  If no  improvement, could consider steroid elixir.  Dehydration/mild hypokalemia -will proceed with IV fluids today and replete with IV potassium.  Restart oral potassium supplement.  Mild transaminitis -likely secondary to chemotherapy.  IV fluids today.  Will trend labs next week.  MD follow-up next week   Patient expressed understanding and was in agreement with this plan. She also understands that She can call clinic at any time with any questions, concerns, or complaints.   Thank you for allowing me to participate in the care of this very pleasant patient.   Time Total: 15 minutes  Visit consisted of counseling and education dealing with the complex and emotionally intense issues of symptom management in the setting of serious illness.Greater than 50%  of this time was spent counseling and coordinating care related to the above assessment and plan.  Signed by: Fonda Mower, PhD, NP-C

## 2024-08-08 NOTE — Patient Instructions (Signed)
 Potassium Chloride Injection What is this medication? POTASSIUM CHLORIDE (poe TASS i um KLOOR ide) prevents and treats low levels of potassium in your body. Potassium plays an important role in maintaining the health of your kidneys, heart, muscles, and nervous system. This medicine may be used for other purposes; ask your health care provider or pharmacist if you have questions. COMMON BRAND NAME(S): PROAMP What should I tell my care team before I take this medication? They need to know if you have any of these conditions: Addison disease Dehydration Diabetes (high blood sugar) Heart disease High levels of potassium in the blood Irregular heartbeat or rhythm Kidney disease Large areas of burned skin An unusual or allergic reaction to potassium, other medications, foods, dyes, or preservatives Pregnant or trying to get pregnant Breast-feeding How should I use this medication? This medication is injected into a vein. It is given in a hospital or clinic setting. Talk to your care team about the use of this medication in children. Special care may be needed. Overdosage: If you think you have taken too much of this medicine contact a poison control center or emergency room at once. NOTE: This medicine is only for you. Do not share this medicine with others. What if I miss a dose? This does not apply. This medication is not for regular use. What may interact with this medication? Do not take this medication with any of the following: Certain diuretics, such as spironolactone, triamterene Eplerenone Sodium polystyrene sulfonate This medication may also interact with the following: Certain medications for blood pressure or heart disease, such as lisinopril, losartan, quinapril, valsartan Medications that lower your chance of fighting infection, such as cyclosporine, tacrolimus NSAIDs, medications for pain and inflammation, such as ibuprofen or naproxen Other potassium supplements Salt  substitutes This list may not describe all possible interactions. Give your health care provider a list of all the medicines, herbs, non-prescription drugs, or dietary supplements you use. Also tell them if you smoke, drink alcohol, or use illegal drugs. Some items may interact with your medicine. What should I watch for while using this medication? Visit your care team for regular checks on your progress. Tell your care team if your symptoms do not start to get better or if they get worse. You may need blood work while you are taking this medication. Avoid salt substitutes unless you are told otherwise by your care team. What side effects may I notice from receiving this medication? Side effects that you should report to your care team as soon as possible: Allergic reactions--skin rash, itching, hives, swelling of the face, lips, tongue, or throat High potassium level--muscle weakness, fast or irregular heartbeat Side effects that usually do not require medical attention (report to your care team if they continue or are bothersome): Diarrhea Nausea Stomach pain Vomiting This list may not describe all possible side effects. Call your doctor for medical advice about side effects. You may report side effects to FDA at 1-800-FDA-1088. Where should I keep my medication? This medication is given in a hospital or clinic. It will not be stored at home. NOTE: This sheet is a summary. It may not cover all possible information. If you have questions about this medicine, talk to your doctor, pharmacist, or health care provider.  2024 Elsevier/Gold Standard (2022-05-13 00:00:00)

## 2024-08-08 NOTE — Telephone Encounter (Signed)
*  Pulm  Pharmacy Patient Advocate Encounter   Received notification from Fax that prior authorization for Esomeprazole  Magnesium  40MG  dr capsules is required/requested.   Insurance verification completed.   The patient is insured through [facility] .   Per test claim: PA required; PA submitted to above mentioned insurance via Latent Key/confirmation #/EOC [id number] Status is pending

## 2024-08-08 NOTE — Telephone Encounter (Signed)
 Your request has been approved Request Reference Number: [id number]. ESOMEPRA MAG CAP 40MG  DR is approved through 08/08/2025. Your patient may now fill this prescription and it will be covered. Authorization Expiration09/25/2026

## 2024-08-09 ENCOUNTER — Ambulatory Visit
Admission: RE | Admit: 2024-08-09 | Discharge: 2024-08-09 | Disposition: A | Discharge status: Home / Self Care | Source: Ambulatory Visit | Attending: Radiation Oncology | Admitting: Radiation Oncology

## 2024-08-09 ENCOUNTER — Other Ambulatory Visit: Payer: Self-pay

## 2024-08-09 DIAGNOSIS — C210 Malignant neoplasm of anus, unspecified: Secondary | ICD-10-CM | POA: Diagnosis not present

## 2024-08-09 LAB — RAD ONC ARIA SESSION SUMMARY
Course Elapsed Days: 8
Plan Fractions Treated to Date: 7
Plan Prescribed Dose Per Fraction: 1.8 Gy
Plan Total Fractions Prescribed: 30
Plan Total Prescribed Dose: 54 Gy
Reference Point Dosage Given to Date: 12.6 Gy
Reference Point Session Dosage Given: 1.8 Gy
Session Number: 7

## 2024-08-12 ENCOUNTER — Other Ambulatory Visit: Payer: Self-pay

## 2024-08-12 ENCOUNTER — Ambulatory Visit
Admission: RE | Admit: 2024-08-12 | Discharge: 2024-08-12 | Disposition: A | Source: Ambulatory Visit | Attending: Radiation Oncology | Admitting: Radiation Oncology

## 2024-08-12 ENCOUNTER — Encounter: Payer: Self-pay | Admitting: Internal Medicine

## 2024-08-12 DIAGNOSIS — C210 Malignant neoplasm of anus, unspecified: Secondary | ICD-10-CM | POA: Diagnosis not present

## 2024-08-12 LAB — RAD ONC ARIA SESSION SUMMARY
Course Elapsed Days: 11
Plan Fractions Treated to Date: 8
Plan Prescribed Dose Per Fraction: 1.8 Gy
Plan Total Fractions Prescribed: 30
Plan Total Prescribed Dose: 54 Gy
Reference Point Dosage Given to Date: 14.4 Gy
Reference Point Session Dosage Given: 1.8 Gy
Session Number: 8

## 2024-08-12 NOTE — Progress Notes (Signed)
 Patient called that she is concerned that her " Mediport catheter has moved".  No pain no swelling she is on anticoagulation.   No acute concerns noted.  They infor Dr. Layvonne team to follow up in AM-  GB

## 2024-08-13 ENCOUNTER — Other Ambulatory Visit: Payer: Self-pay | Admitting: Licensed Clinical Social Worker

## 2024-08-13 ENCOUNTER — Ambulatory Visit
Admission: RE | Admit: 2024-08-13 | Discharge: 2024-08-13 | Disposition: A | Source: Ambulatory Visit | Attending: Oncology | Admitting: Oncology

## 2024-08-13 ENCOUNTER — Inpatient Hospital Stay: Admitting: Licensed Clinical Social Worker

## 2024-08-13 ENCOUNTER — Telehealth: Payer: Self-pay

## 2024-08-13 ENCOUNTER — Other Ambulatory Visit: Payer: Self-pay

## 2024-08-13 ENCOUNTER — Ambulatory Visit
Admission: RE | Admit: 2024-08-13 | Discharge: 2024-08-13 | Disposition: A | Discharge status: Home / Self Care | Source: Ambulatory Visit | Attending: Radiation Oncology | Admitting: Radiation Oncology

## 2024-08-13 ENCOUNTER — Encounter: Payer: Self-pay | Admitting: Oncology

## 2024-08-13 ENCOUNTER — Ambulatory Visit
Admission: RE | Admit: 2024-08-13 | Discharge: 2024-08-13 | Disposition: A | Discharge status: Home / Self Care | Source: Ambulatory Visit | Attending: Oncology | Admitting: Oncology

## 2024-08-13 ENCOUNTER — Encounter: Payer: Self-pay | Admitting: Licensed Clinical Social Worker

## 2024-08-13 ENCOUNTER — Inpatient Hospital Stay

## 2024-08-13 DIAGNOSIS — Z800 Family history of malignant neoplasm of digestive organs: Secondary | ICD-10-CM

## 2024-08-13 DIAGNOSIS — Z95828 Presence of other vascular implants and grafts: Secondary | ICD-10-CM

## 2024-08-13 DIAGNOSIS — Z1379 Encounter for other screening for genetic and chromosomal anomalies: Secondary | ICD-10-CM

## 2024-08-13 DIAGNOSIS — C210 Malignant neoplasm of anus, unspecified: Secondary | ICD-10-CM | POA: Diagnosis not present

## 2024-08-13 DIAGNOSIS — C50919 Malignant neoplasm of unspecified site of unspecified female breast: Secondary | ICD-10-CM

## 2024-08-13 LAB — CBC (CANCER CENTER ONLY)
HCT: 35.3 % — ABNORMAL LOW (ref 36.0–46.0)
Hemoglobin: 11.2 g/dL — ABNORMAL LOW (ref 12.0–15.0)
MCH: 30.8 pg (ref 26.0–34.0)
MCHC: 31.7 g/dL (ref 30.0–36.0)
MCV: 97 fL (ref 80.0–100.0)
Platelet Count: 249 K/uL (ref 150–400)
RBC: 3.64 MIL/uL — ABNORMAL LOW (ref 3.87–5.11)
RDW: 15.2 % (ref 11.5–15.5)
WBC Count: 3.4 K/uL — ABNORMAL LOW (ref 4.0–10.5)
nRBC: 0 % (ref 0.0–0.2)

## 2024-08-13 LAB — RAD ONC ARIA SESSION SUMMARY
Course Elapsed Days: 12
Plan Fractions Treated to Date: 9
Plan Prescribed Dose Per Fraction: 1.8 Gy
Plan Total Fractions Prescribed: 30
Plan Total Prescribed Dose: 54 Gy
Reference Point Dosage Given to Date: 16.2 Gy
Reference Point Session Dosage Given: 1.8 Gy
Session Number: 9

## 2024-08-13 LAB — GENETIC SCREENING ORDER

## 2024-08-13 NOTE — Progress Notes (Signed)
 REFERRING PROVIDER: Babara Call, MD [address] [place],  [place] [address]  PRIMARY PROVIDER:  Epifanio Alm SQUIBB, MD  PRIMARY REASON FOR VISIT:  1. Invasive carcinoma of breast (HCC)   2. Anal squamous cell carcinoma (HCC)   3. Family history of colon cancer      HISTORY OF PRESENT ILLNESS:   Ms. Larmon, a 48 y.o. female, was seen for a [place] cancer genetics consultation at the request of Dr. Babara due to a personal and family history of cancer.  Ms. Shipman presents to clinic today to discuss the possibility of a hereditary predisposition to cancer, genetic testing, and to further clarify her future cancer risks, as well as potential cancer risks for family members.   CANCER HISTORY:  Oncology History  Anal squamous cell carcinoma (HCC)  05/29/2024 Initial Diagnosis   Anal squamous cell carcinoma   Patient has noticed a hard mass at the anal area which she initially thought was a hemorrhoid.  The mass has been present for more than a month and appears to be enlarging. Patient was referred to establish care with surgeon Dr. Tye  05/16/2024, incisional biopsy of anal mass showed 1. Anus, biopsy, mass :       - INVASIVE MODERATELY DIFFERENTIATED SQUAMOUS CELL CARCINOMA  06/19/2024, left inguinal adenopathy biopsy showed Metastatic moderately differentiated squamous cell carcinoma.  '    05/29/2024 Cancer Staging   Staging form: Anus, AJCC V9 - Clinical stage from 05/29/2024: Stage IIIA (cT3, cN1a, cM0) - Signed by Babara Call, MD on 06/26/2024 Stage prefix: Initial diagnosis   06/05/2024 Imaging   CT chest abdomen pelvis with contrast  Slight thickening in the area of the anal canal. Please correlate for history of neoplasm. There is a pathologic enlarged left inguinal node. There is a borderline right-sided node.   No additional areas of abnormal nodal enlargement or other aggressive appearing mass lesion at this time.   Fatty liver infiltration with known segment 7 hepatic  hemangioma.   Of note there is a right-sided upper breast mass which was not clearly seen on prior CT scan. Please correlate for any prior study or if needed diagnostic mammographic evaluation and possible ultrasound when appropriate.     06/12/2024 Imaging   CT chest abdomen pelvis with contrast showed Slight thickening in the area of the anal canal. Please correlate for history of neoplasm. There is a pathologic enlarged left inguinal node. There is a borderline right-sided node.   No additional areas of abnormal nodal enlargement or other aggressive appearing mass lesion at this time.   Fatty liver infiltration with known segment 7 hepatic hemangioma.   Of note there is a right-sided upper breast mass which was not clearly seen on prior CT scan. Please correlate for any prior study or if needed diagnostic mammographic evaluation and possible ultrasound when appropriate.   06/19/2024 Procedure   1. Lymph node, biopsy, left inguinal adenopathy :       -  METASTATIC MODERATELY DIFFERENTIATED SQUAMOUS CELL CARCINOMA    07/01/2024 Imaging   PET scan showed 1. Hypermetabolic anal mass with hypermetabolic left inguinal adenopathy, compatible with anorectal carcinoma. 2. Hypermetabolic right breast nodule with recent biopsies on 06/18/2024. Please correlate with pathology report.   07/31/2024 -  Chemotherapy   Patient is on Treatment Plan : ANUS Mitomycin  D1,28 + 5FU D1-4, 28-31 q32d     Invasive carcinoma of breast (HCC)  06/12/2024 Imaging   CT chest abdomen pelvis with contrast showed Slight thickening  in the area of the anal canal. Please correlate for history of neoplasm. There is a pathologic enlarged left inguinal node. There is a borderline right-sided node.   No additional areas of abnormal nodal enlargement or other aggressive appearing mass lesion at this time.   Fatty liver infiltration with known segment 7 hepatic hemangioma.   Of note there is a right-sided upper  breast mass which was not clearly seen on prior CT scan. Please correlate for any prior study or if needed diagnostic mammographic evaluation and possible ultrasound when appropriate.   06/17/2024 Mammogram   1. There is a suspicious 17 mm mass at the site of CT and mammographic concern in the RIGHT breast. It demonstrates associated pleomorphic calcifications. Recommend ultrasound-guided biopsy for definitive characterization. 2. There is incidental sonographic note of a 7 mm mass in the RIGHT breast at 12 o'clock and a 6 mm non mass area at 9 o'clock. Recommend ultrasound-guided biopsy of these 2 areas for definitive characterization given suspicious appearance of the dominant mass. 3. No suspicious RIGHT axillary adenopathy. 4. No mammographic evidence of malignancy in the LEFT breast.   06/18/2024 Cancer Staging   Staging form: Breast, AJCC 8th Edition - Clinical stage from 06/18/2024: Stage IB (cT1b, cN0, cM0, G3, ER-, PR-, HER2-) - Signed by Babara Call, MD on 06/26/2024 Stage prefix: Initial diagnosis Histologic grading system: 3 grade system   06/18/2024 Initial Diagnosis   Invasive carcinoma of breast [facility])  Patient with right breast biopsy.  Pathology showed 1. Breast, right, needle core biopsy, 12:00 12cmfn (heart clip) :      INVASIVE DUCTAL CARCINOMA      TUBULE FORMATION: SCORE 3      NUCLEAR PLEOMORPHISM: SCORE 3      MITOTIC COUNT: SCORE 2      TOTAL SCORE: 8      OVERALL GRADE: 3      LYMPHOVASCULAR INVASION: NOT IDENTIFIED      CANCER LENGTH: 1.0 CM      CALCIFICATIONS: NOT IDENTIFIED      OTHER FINDINGS: NONE      ER negative, PR negative, HER2 negative (IHC 0) Ki-67 95%.       2. Breast, right, needle core biopsy, 12:00 8cmfn (coil clip) :      INVASIVE DUCTAL CARCINOMA      DUCTAL CARCINOMA IN SITU, SOLID, HIGH NUCLEAR GRADE WITH NECROSIS      TUBULE FORMATION: SCORE 3      NUCLEAR PLEOMORPHISM: SCORE 3      MITOTIC COUNT: SCORE 2      TOTAL SCORE: 8       OVERALL GRADE: 3      LYMPHOVASCULAR INVASION: NOT IDENTIFIED      CANCER LENGTH: 0.3 CM      CALCIFICATIONS: NOT IDENTIFIED      OTHER FINDINGS: NONE            3. Breast, right, needle core biopsy, 9:00 12cmfn (venus) :      BENIGN BREAST TISSUE WITH DENSE STROMAL FIBROSIS.      NEGATIVE FOR ATYPIA OR MALIGNANCY.    07/08/2024 Surgery   Patient went right breast lumpectomy and sentinel lymph node biopsy.  1. 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      - DUCTAL CARCINOMA IN SITU (DCIS).      - SEE CANCER SUMMARY AND NOTE BELOW.      - TWO BIOPSY  SITES WITH CORRESPONDING HEART AND COIL CLIPS.      - TWO SAVI SCOUT TAGS.       2. Lymph node, sentinel, biopsy, Right :      - MICROMETASTATIC CARCINOMA INVOLVES ONE OF FIVE LYMPH NODES (1MI/5).      - SEE CANCER SUMMARY AND NOTE BELOW.       3. Breast, excision, Right medial posterior margin :      - BENIGN BREAST TISSUE.      - NEGATIVE FOR ATYPIA AND MALIGNANCY.      - SEE CANCER SUMMARY BELOW.   TUMOR Histologic Type: Invasive carcinoma of no special type (ductal) Histologic Grade (Nottingham Histologic Score) Glandular (Acinar)/Tubular Differentiation: 3 Nuclear Pleomorphism: 3 Mitotic Rate: 3 Overall Grade: 3 Tumor Size: Greatest dimension of largest invasive focus: 19 mm Ductal Carcinoma In Situ (DCIS): Present, high-grade with comedonecrosis Lymphatic and/or Vascular Invasion: Present, extensive (LVI in 2 or more blocks)  Treatment Effect in the Breast: No known presurgical therapy MARGINS Margin Status for Invasive Carcinoma: Invasive Carcinoma involves inferior (unifocal) and superior (unifocal) margins and is 0.5 mm to anterior margin Margin Status for DCIS: All margins negative for DCIS Distance from DCIS to closest margin: 0.5 mm Specify closest margin: Inferior REGIONAL LYMPH NODES Regional Lymph Node Status: Tumor present in regional lymph  node(s) Number of Lymph Nodes with Macrometastases (greater than 2 mm): 0 Number of Lymph Nodes with Micrometastases (greater than 0.2 mm to 2 mm and/or greater than 200 cells): 1 Number of Lymph Nodes with Isolated Tumor Cells (0.2 mm or less OR 200 cells or less): 0 Size of Largest Metastatic Deposit: 1.9 mm Extranodal Extension: Not identified Total Number of Lymph Nodes Examined (sentinel and non-sentinel): 5 Number of Sentinel Nodes Examined: 5 DISTANT METASTASIS Distant Site(s) Involved, if applicable: Not applicable PATHOLOGIC STAGE CLASSIFICATION (pTNM, AJCC 8th Edition): Modified Classification: Not applicable pT Category: pT1c T Suffix: (m) multiple primary synchronous tumors in a single organ pN Category: pN67mi N Suffix: (sn) pM Category: Not applicable SPECIAL STUDIES Breast Biomarker Testing Performed on Previous Biopsy: [id number] (heart) Estrogen Receptor: Negative (0%) Progesterone Receptor: Negative (0%) HER2 IHC: Negative (0) Proliferation Marker Ki67: 95%   07/22/2024 Cancer Staging   Staging form: Breast, AJCC 8th Edition - Pathologic stage from 07/22/2024: Stage IB (pT1c, pN54mi(sn), cM0, G3, ER-, PR-, HER2-) - Signed by Babara Call, MD on 07/22/2024 Stage prefix: Initial diagnosis Method of lymph node assessment: Sentinel lymph node biopsy Multigene prognostic tests performed: None Histologic grading system: 3 grade system   07/25/2024 Surgery   Patient reports additional excision for positive margin.  1. Breast, excision, superior margin of right breast - RESIDUAL INVASIVE DUCTAL CARCINOMA, COMPLETELY EXCISED. - CARCINOMA PRESENT 2 MM FROM THE POSTERIOR MARGIN AND 4 MM FROM THE ANTERIOR AND SUPERIOR MARGINS. 2. Breast, excision, inferior margin of right breast - RESIDUAL INVASIVE DUCTAL CARCINOMA, COMPLETELY EXCISED. - CARCINOMA PRESENT 1 MM FROM THE NEW INFERIOR MARGIN.     RELEVANT MEDICAL HISTORY:  Menarche was at age 76-9.  First live birth at age 69.   Ovaries intact: yes.  Hysterectomy: no.   Past Medical History:  Diagnosis Date   Anemia    Anginal pain    Anxiety    Arthritis    Asthma    Cancer (HCC)    Chronic pain syndrome 05/21/2023   DDD (degenerative disc disease), cervical 05/22/2023   Deep venous thrombosis (HCC)    Depression    Dyspnea    Fibromyalgia  GERD (gastroesophageal reflux disease)    Headache    Heart murmur    History of hiatal hernia    History of kidney stones    Lupus    Malignant neoplasm of upper-outer quadrant of right female breast, unspecified estrogen receptor status (HCC) 2025   Pneumonia    PTSD (post-traumatic stress disorder)    Skin candidiasis 05/29/2024   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    Past Surgical History:  Procedure Laterality Date   BREAST BIOPSY Right 06/18/2024   US  RT BREAST BX W LOC DEV 1ST LESION IMG BX SPEC US  GUIDE 06/18/2024 [facility]   BREAST BIOPSY Right 06/18/2024   US  RT BREAST BX W LOC DEV EA ADD LESION IMG BX SPEC US  GUIDE 06/18/2024 [facility]   BREAST BIOPSY Right 06/18/2024   US  RT BREAST BX W LOC DEV EA ADD LESION IMG BX SPEC US  GUIDE 06/18/2024 [facility]   BREAST BIOPSY Right 07/05/2024   US  RT BREAST SAVI/RF TAG 1ST LESION US  GUIDE 07/05/2024 [facility]   BREAST BIOPSY Right 07/05/2024   US  RT BREAST SAVI/RF TAG EA ADD'L LESION US  GUIDE 07/05/2024 [facility]   BREAST LUMPECTOMY Right 07/25/2024   Procedure: BREAST LUMPECTOMY;  Surgeon: Tye Millet, DO;  Location: [facility];  Service: General;  Laterality: Right;   BREAST LUMPECTOMY WITH RADIO FREQUENCY LOCALIZER Right 07/08/2024   Procedure: BREAST LUMPECTOMY WITH RADIO FREQUENCY LOCALIZER;  Surgeon: Tye Millet, DO;  Location: [facility];  Service: General;  Laterality: Right;   COLONOSCOPY WITH PROPOFOL  N/A 02/17/2023   Procedure: COLONOSCOPY WITH PROPOFOL ;  Surgeon: Maryruth Ole DASEN, MD;  Location: [facility];  Service: Endoscopy;  Laterality:  N/A;   ESOPHAGOGASTRODUODENOSCOPY (EGD) WITH PROPOFOL  N/A 02/17/2023   Procedure: ESOPHAGOGASTRODUODENOSCOPY (EGD) WITH PROPOFOL ;  Surgeon: Maryruth Ole DASEN, MD;  Location: [facility];  Service: Endoscopy;  Laterality: N/A;   EXTRACORPOREAL SHOCK WAVE LITHOTRIPSY     PORTACATH PLACEMENT Right 07/08/2024   Procedure: INSERTION, TUNNELED CENTRAL VENOUS DEVICE, WITH PORT;  Surgeon: Tye Millet, DO;  Location: [facility];  Service: General;  Laterality: Right;   RECTAL EXAM UNDER ANESTHESIA N/A 05/16/2024   Procedure: EXAM UNDER ANESTHESIA, RECTUM, INCISIONAL BIOPSY OF ANAL MASS;  Surgeon: Tye Millet, DO;  Location: [facility];  Service: General;  Laterality: N/A;   SENTINEL NODE BIOPSY Right 07/08/2024   Procedure: BIOPSY, LYMPH NODE, SENTINEL;  Surgeon: Tye Millet, DO;  Location: [facility];  Service: General;  Laterality: Right;   TONSILLECTOMY     TUBAL LIGATION     VULVECTOMY N/A 07/29/2020   Procedure: WIDE EXCISION VULVECTOMY;  Surgeon: Mancil Barter, MD;  Location: [facility];  Service: Gynecology;  Laterality: N/A;    FAMILY HISTORY:  We obtained a detailed, 4-generation family history.  Significant diagnoses are listed below: Family History  Problem Relation Age of Onset   Diabetes Mother    Congestive Heart Failure Mother    Colon cancer Father 51   Lupus Maternal Grandmother    Cancer Maternal Grandfather        either prostate or colon   Colon cancer Paternal Grandfather    Lung cancer Paternal Grandfather    Ms. Hassinger has 2 sons and 1 daughter. She has 1 paternal half brother, 1 paternal half sister, no cancers.  Ms. Birenbaum's father had colon cancer and passed at 27. Limited information about this side of the family. Paternal uncle had liver cancer. Paternal grandfather had lung and colon cancer over age 11.   Ms. Beckers's mother  passed at 49. Maternal grandfather had cancer, either prostate or colon cancer.   Ms. Mctighe is unaware of previous family history of genetic  testing for hereditary cancer risks. There is no reported Ashkenazi Jewish ancestry. There is no known consanguinity.    GENETIC COUNSELING ASSESSMENT: Ms. Tom is a 48 y.o. female with a personal and family history which is somewhat suggestive of a hereditary cancer syndrome and predisposition to cancer. We, therefore, discussed and recommended the following at today's visit.   DISCUSSION: We discussed that approximately 10% of breast cancer is hereditary. Most cases of hereditary breast cancer are associated with BRCA1/BRCA2 genes, although there are other genes associated with hereditary cancer as well. Cancers and risks are gene specific. We discussed that testing is beneficial for several reasons including knowing about cancer risks, identifying potential screening and risk-reduction options that may be appropriate, and to understand if other family members could be at risk for cancer and allow them to undergo genetic testing.   We reviewed the characteristics, features and inheritance patterns of hereditary cancer syndromes. We also discussed genetic testing, including the appropriate family members to test, the process of testing, insurance coverage and turn-around-time for results. We discussed the implications of a negative, positive and/or variant of uncertain significant result. We recommended Ms. Toren pursue genetic testing for the [facility] CancerNext-Expanded+RNA gene panel.   The CancerNext-Expanded gene panel offered by [facility] and includes sequencing, rearrangement, and RNA analysis for the following 77 genes: AIP, ALK, APC, ATM, AXIN2, BAP1, BARD1, BMPR1A, BRCA1, BRCA2, BRIP1, CDC73, CDH1, CDK4, CDKN1B, CDKN2A, CEBPA, CHEK2, CTNNA1, DDX41, DICER1, ETV6, FH, FLCN, GATA2, LZTR1, MAX, MBD4, MEN1, MET, MLH1, MSH2, MSH3, MSH6, MUTYH, NF1, NF2, NTHL1, PALB2, PHOX2B, PMS2, POT1, PRKAR1A, PTCH1, PTEN, RAD51C, RAD51D, RB1, RET, RPS20, RUNX1, SDHA, SDHAF2, SDHB, SDHC, SDHD, SMAD4, SMARCA4,  SMARCB1, SMARCE1, STK11, SUFU, TMEM127, TP53, TSC1, TSC2, VHL, and WT1 (sequencing and deletion/duplication); EGFR, HOXB13, KIT, MITF, PDGFRA, POLD1, and POLE (sequencing only); EPCAM and GREM1 (deletion/duplication only).   Based on Ms. Whitefield's personal and family history of cancer, she meets medical criteria for genetic testing. Despite that she meets criteria, she may still have an out of pocket cost.   PLAN: After considering the risks, benefits, and limitations, Ms. Rathod provided informed consent to pursue genetic testing and the blood sample was sent to [facility] for analysis of the CancerNext-Expanded+RNA Panel. Results should be available within approximately 2-3 weeks' time, at which point they will be disclosed by telephone to Ms. Savona, as will any additional recommendations warranted by these results. Ms. Slingerland will receive a summary of her genetic counseling visit and a copy of her results once available. This information will also be available in Epic.   Ms. Pardi's questions were answered to her satisfaction today. Our contact information was provided should additional questions or concerns arise. Thank you for the referral and allowing us  to share in the care of your patient.   Dena Cary, MS, [facility] Genetic Counselor [email or web address] Phone: [phone]  40 minutes were spent on the date of the encounter in service to the patient including preparation, face-to-face consultation, documentation and care coordination. Dr. Delinda was available for discussion regarding this case.   _______________________________________________________________________ For Office Staff:  Number of people involved in session: 1 Was an Intern/ student involved with case: no

## 2024-08-13 NOTE — Telephone Encounter (Signed)
 Follow up call made to patient regarding port a cath. No answer. VM left for her to call back and let us  know if she is still having problems.

## 2024-08-14 ENCOUNTER — Other Ambulatory Visit: Payer: Self-pay

## 2024-08-14 ENCOUNTER — Ambulatory Visit
Admission: RE | Admit: 2024-08-14 | Discharge: 2024-08-14 | Disposition: A | Discharge status: Home / Self Care | Source: Ambulatory Visit | Attending: Radiation Oncology | Admitting: Radiation Oncology

## 2024-08-14 DIAGNOSIS — Z171 Estrogen receptor negative status [ER-]: Secondary | ICD-10-CM | POA: Diagnosis not present

## 2024-08-14 DIAGNOSIS — Z79899 Other long term (current) drug therapy: Secondary | ICD-10-CM | POA: Diagnosis not present

## 2024-08-14 DIAGNOSIS — M797 Fibromyalgia: Secondary | ICD-10-CM | POA: Diagnosis not present

## 2024-08-14 DIAGNOSIS — C210 Malignant neoplasm of anus, unspecified: Secondary | ICD-10-CM | POA: Diagnosis present

## 2024-08-14 DIAGNOSIS — K219 Gastro-esophageal reflux disease without esophagitis: Secondary | ICD-10-CM | POA: Diagnosis not present

## 2024-08-14 DIAGNOSIS — Z9221 Personal history of antineoplastic chemotherapy: Secondary | ICD-10-CM | POA: Insufficient documentation

## 2024-08-14 DIAGNOSIS — Z1732 Human epidermal growth factor receptor 2 negative status: Secondary | ICD-10-CM | POA: Diagnosis not present

## 2024-08-14 DIAGNOSIS — N179 Acute kidney failure, unspecified: Secondary | ICD-10-CM | POA: Diagnosis not present

## 2024-08-14 DIAGNOSIS — Z923 Personal history of irradiation: Secondary | ICD-10-CM | POA: Insufficient documentation

## 2024-08-14 DIAGNOSIS — M329 Systemic lupus erythematosus, unspecified: Secondary | ICD-10-CM | POA: Insufficient documentation

## 2024-08-14 DIAGNOSIS — R011 Cardiac murmur, unspecified: Secondary | ICD-10-CM | POA: Insufficient documentation

## 2024-08-14 DIAGNOSIS — C50411 Malignant neoplasm of upper-outer quadrant of right female breast: Secondary | ICD-10-CM | POA: Insufficient documentation

## 2024-08-14 DIAGNOSIS — Z791 Long term (current) use of non-steroidal anti-inflammatories (NSAID): Secondary | ICD-10-CM | POA: Insufficient documentation

## 2024-08-14 DIAGNOSIS — Z510 Encounter for antineoplastic radiation therapy: Secondary | ICD-10-CM | POA: Insufficient documentation

## 2024-08-14 DIAGNOSIS — T451X5A Adverse effect of antineoplastic and immunosuppressive drugs, initial encounter: Secondary | ICD-10-CM | POA: Diagnosis not present

## 2024-08-14 DIAGNOSIS — R590 Localized enlarged lymph nodes: Secondary | ICD-10-CM | POA: Insufficient documentation

## 2024-08-14 DIAGNOSIS — Z1722 Progesterone receptor negative status: Secondary | ICD-10-CM | POA: Diagnosis not present

## 2024-08-14 DIAGNOSIS — J45909 Unspecified asthma, uncomplicated: Secondary | ICD-10-CM | POA: Diagnosis not present

## 2024-08-14 DIAGNOSIS — I82A11 Acute embolism and thrombosis of right axillary vein: Secondary | ICD-10-CM | POA: Diagnosis not present

## 2024-08-14 DIAGNOSIS — I2693 Single subsegmental pulmonary embolism without acute cor pulmonale: Secondary | ICD-10-CM | POA: Diagnosis not present

## 2024-08-14 DIAGNOSIS — D0511 Intraductal carcinoma in situ of right breast: Secondary | ICD-10-CM | POA: Diagnosis not present

## 2024-08-14 DIAGNOSIS — K521 Toxic gastroenteritis and colitis: Secondary | ICD-10-CM | POA: Diagnosis not present

## 2024-08-14 DIAGNOSIS — Y842 Radiological procedure and radiotherapy as the cause of abnormal reaction of the patient, or of later complication, without mention of misadventure at the time of the procedure: Secondary | ICD-10-CM | POA: Diagnosis not present

## 2024-08-14 DIAGNOSIS — I82C11 Acute embolism and thrombosis of right internal jugular vein: Secondary | ICD-10-CM | POA: Diagnosis not present

## 2024-08-14 DIAGNOSIS — C50919 Malignant neoplasm of unspecified site of unspecified female breast: Secondary | ICD-10-CM

## 2024-08-14 DIAGNOSIS — E876 Hypokalemia: Secondary | ICD-10-CM | POA: Diagnosis not present

## 2024-08-14 HISTORY — PX: PICC LINE INSERTION: CATH118290

## 2024-08-14 LAB — RAD ONC ARIA SESSION SUMMARY
Course Elapsed Days: 13
Plan Fractions Treated to Date: 10
Plan Prescribed Dose Per Fraction: 1.8 Gy
Plan Total Fractions Prescribed: 30
Plan Total Prescribed Dose: 54 Gy
Reference Point Dosage Given to Date: 18 Gy
Reference Point Session Dosage Given: 1.8 Gy
Session Number: 10

## 2024-08-15 ENCOUNTER — Inpatient Hospital Stay

## 2024-08-15 ENCOUNTER — Inpatient Hospital Stay (HOSPITAL_BASED_OUTPATIENT_CLINIC_OR_DEPARTMENT_OTHER): Admitting: Oncology

## 2024-08-15 ENCOUNTER — Ambulatory Visit
Admission: RE | Admit: 2024-08-15 | Discharge: 2024-08-15 | Disposition: A | Discharge status: Home / Self Care | Source: Ambulatory Visit | Attending: Radiation Oncology | Admitting: Radiation Oncology

## 2024-08-15 ENCOUNTER — Other Ambulatory Visit: Payer: Self-pay

## 2024-08-15 ENCOUNTER — Encounter: Payer: Self-pay | Admitting: Oncology

## 2024-08-15 VITALS — BP 97/59 | HR 96 | Temp 98.3°F | Resp 19 | Ht 59.0 in | Wt 195.1 lb

## 2024-08-15 DIAGNOSIS — Z1732 Human epidermal growth factor receptor 2 negative status: Secondary | ICD-10-CM | POA: Insufficient documentation

## 2024-08-15 DIAGNOSIS — Z801 Family history of malignant neoplasm of trachea, bronchus and lung: Secondary | ICD-10-CM | POA: Insufficient documentation

## 2024-08-15 DIAGNOSIS — I2693 Single subsegmental pulmonary embolism without acute cor pulmonale: Secondary | ICD-10-CM | POA: Insufficient documentation

## 2024-08-15 DIAGNOSIS — C210 Malignant neoplasm of anus, unspecified: Secondary | ICD-10-CM

## 2024-08-15 DIAGNOSIS — E876 Hypokalemia: Secondary | ICD-10-CM | POA: Insufficient documentation

## 2024-08-15 DIAGNOSIS — Y842 Radiological procedure and radiotherapy as the cause of abnormal reaction of the patient, or of later complication, without mention of misadventure at the time of the procedure: Secondary | ICD-10-CM | POA: Insufficient documentation

## 2024-08-15 DIAGNOSIS — N179 Acute kidney failure, unspecified: Secondary | ICD-10-CM

## 2024-08-15 DIAGNOSIS — Z87442 Personal history of urinary calculi: Secondary | ICD-10-CM | POA: Insufficient documentation

## 2024-08-15 DIAGNOSIS — I2699 Other pulmonary embolism without acute cor pulmonale: Secondary | ICD-10-CM

## 2024-08-15 DIAGNOSIS — I82C11 Acute embolism and thrombosis of right internal jugular vein: Secondary | ICD-10-CM | POA: Insufficient documentation

## 2024-08-15 DIAGNOSIS — Z79899 Other long term (current) drug therapy: Secondary | ICD-10-CM | POA: Insufficient documentation

## 2024-08-15 DIAGNOSIS — R590 Localized enlarged lymph nodes: Secondary | ICD-10-CM | POA: Insufficient documentation

## 2024-08-15 DIAGNOSIS — T451X5A Adverse effect of antineoplastic and immunosuppressive drugs, initial encounter: Secondary | ICD-10-CM | POA: Insufficient documentation

## 2024-08-15 DIAGNOSIS — M797 Fibromyalgia: Secondary | ICD-10-CM | POA: Insufficient documentation

## 2024-08-15 DIAGNOSIS — D1803 Hemangioma of intra-abdominal structures: Secondary | ICD-10-CM | POA: Insufficient documentation

## 2024-08-15 DIAGNOSIS — K760 Fatty (change of) liver, not elsewhere classified: Secondary | ICD-10-CM | POA: Insufficient documentation

## 2024-08-15 DIAGNOSIS — Z87412 Personal history of vulvar dysplasia: Secondary | ICD-10-CM | POA: Insufficient documentation

## 2024-08-15 DIAGNOSIS — D0511 Intraductal carcinoma in situ of right breast: Secondary | ICD-10-CM | POA: Insufficient documentation

## 2024-08-15 DIAGNOSIS — I82A11 Acute embolism and thrombosis of right axillary vein: Secondary | ICD-10-CM | POA: Insufficient documentation

## 2024-08-15 DIAGNOSIS — C50411 Malignant neoplasm of upper-outer quadrant of right female breast: Secondary | ICD-10-CM | POA: Insufficient documentation

## 2024-08-15 DIAGNOSIS — J45909 Unspecified asthma, uncomplicated: Secondary | ICD-10-CM | POA: Insufficient documentation

## 2024-08-15 DIAGNOSIS — R110 Nausea: Secondary | ICD-10-CM

## 2024-08-15 DIAGNOSIS — Z171 Estrogen receptor negative status [ER-]: Secondary | ICD-10-CM | POA: Insufficient documentation

## 2024-08-15 DIAGNOSIS — Z7951 Long term (current) use of inhaled steroids: Secondary | ICD-10-CM | POA: Insufficient documentation

## 2024-08-15 DIAGNOSIS — K521 Toxic gastroenteritis and colitis: Secondary | ICD-10-CM | POA: Insufficient documentation

## 2024-08-15 DIAGNOSIS — R197 Diarrhea, unspecified: Secondary | ICD-10-CM

## 2024-08-15 DIAGNOSIS — E860 Dehydration: Secondary | ICD-10-CM | POA: Insufficient documentation

## 2024-08-15 DIAGNOSIS — C50919 Malignant neoplasm of unspecified site of unspecified female breast: Secondary | ICD-10-CM | POA: Diagnosis not present

## 2024-08-15 DIAGNOSIS — J9811 Atelectasis: Secondary | ICD-10-CM | POA: Insufficient documentation

## 2024-08-15 DIAGNOSIS — Z1722 Progesterone receptor negative status: Secondary | ICD-10-CM | POA: Insufficient documentation

## 2024-08-15 DIAGNOSIS — K627 Radiation proctitis: Secondary | ICD-10-CM | POA: Insufficient documentation

## 2024-08-15 DIAGNOSIS — Z800 Family history of malignant neoplasm of digestive organs: Secondary | ICD-10-CM | POA: Insufficient documentation

## 2024-08-15 LAB — GASTROINTESTINAL PANEL BY PCR, STOOL (REPLACES STOOL CULTURE)

## 2024-08-15 LAB — CBC WITH DIFFERENTIAL (CANCER CENTER ONLY)
Abs Immature Granulocytes: 0.01 K/uL (ref 0.00–0.07)
Basophils Absolute: 0 K/uL (ref 0.0–0.1)
Basophils Relative: 1 %
Eosinophils Absolute: 0.1 K/uL (ref 0.0–0.5)
Eosinophils Relative: 3 %
HCT: 34.9 % — ABNORMAL LOW (ref 36.0–46.0)
Hemoglobin: 11.2 g/dL — ABNORMAL LOW (ref 12.0–15.0)
Immature Granulocytes: 0 %
Lymphocytes Relative: 18 %
Lymphs Abs: 0.5 K/uL — ABNORMAL LOW (ref 0.7–4.0)
MCH: 30.9 pg (ref 26.0–34.0)
MCHC: 32.1 g/dL (ref 30.0–36.0)
MCV: 96.4 fL (ref 80.0–100.0)
Monocytes Absolute: 0.4 K/uL (ref 0.1–1.0)
Monocytes Relative: 13 %
Neutro Abs: 1.9 K/uL (ref 1.7–7.7)
Neutrophils Relative %: 65 %
Platelet Count: 211 K/uL (ref 150–400)
RBC: 3.62 MIL/uL — ABNORMAL LOW (ref 3.87–5.11)
RDW: 15.6 % — ABNORMAL HIGH (ref 11.5–15.5)
WBC Count: 2.9 K/uL — ABNORMAL LOW (ref 4.0–10.5)
nRBC: 0 % (ref 0.0–0.2)

## 2024-08-15 LAB — CMP (CANCER CENTER ONLY)
ALT: 24 U/L (ref 0–44)
AST: 24 U/L (ref 15–41)
Albumin: 3.3 g/dL — ABNORMAL LOW (ref 3.5–5.0)
Alkaline Phosphatase: 76 U/L (ref 38–126)
Anion gap: 6 (ref 5–15)
BUN: 7 mg/dL (ref 6–20)
CO2: 26 mmol/L (ref 22–32)
Calcium: 8.6 mg/dL — ABNORMAL LOW (ref 8.9–10.3)
Chloride: 104 mmol/L (ref 98–111)
Creatinine: 1.12 mg/dL — ABNORMAL HIGH (ref 0.44–1.00)
GFR, Estimated: >60 mL/min (ref >60)
Glucose, Bld: 103 mg/dL — ABNORMAL HIGH (ref 70–99)
Potassium: 3.7 mmol/L (ref 3.5–5.1)
Sodium: 136 mmol/L (ref 135–145)
Total Bilirubin: 0.6 mg/dL (ref 0.0–1.2)
Total Protein: 6.3 g/dL — ABNORMAL LOW (ref 6.5–8.1)

## 2024-08-15 LAB — RAD ONC ARIA SESSION SUMMARY
Course Elapsed Days: 14
Plan Fractions Treated to Date: 11
Plan Prescribed Dose Per Fraction: 1.8 Gy
Plan Total Fractions Prescribed: 30
Plan Total Prescribed Dose: 54 Gy
Reference Point Dosage Given to Date: 19.8 Gy
Reference Point Session Dosage Given: 1.8 Gy
Session Number: 11

## 2024-08-15 LAB — C DIFFICILE QUICK SCREEN W PCR REFLEX
C Diff antigen: NEGATIVE
C Diff interpretation: NOT DETECTED
C Diff toxin: NEGATIVE

## 2024-08-15 MED ORDER — LORAZEPAM 0.5 MG PO TABS
0.5000 mg | ORAL_TABLET | Freq: Two times a day (BID) | ORAL | 0 refills | Status: AC | PRN
Start: 2024-08-15 — End: ?

## 2024-08-15 MED ORDER — SODIUM CHLORIDE 0.9 % IV SOLN
Freq: Once | INTRAVENOUS | Status: AC
  Filled 2024-08-15: qty 250

## 2024-08-15 MED ORDER — PROMETHAZINE-DM 6.25-15 MG/5ML PO SYRP: 5.0000 mL | ORAL_SOLUTION | Freq: Four times a day (QID) | ORAL | 0 refills | Status: DC | PRN

## 2024-08-15 MED ORDER — ENOXAPARIN SODIUM 80 MG/0.8ML IJ SOSY: 80.0000 mg | PREFILLED_SYRINGE | Freq: Two times a day (BID) | INTRAMUSCULAR | 1 refills | Status: DC

## 2024-08-15 MED ORDER — ALTEPLASE 2 MG IJ SOLR
2.0000 mg | Freq: Once | INTRAMUSCULAR | Status: AC
  Administered 2024-08-15: 2 mg
  Filled 2024-08-15: qty 2

## 2024-08-15 NOTE — Assessment & Plan Note (Signed)
 Secondary to chemotherapy and radiation.  Recommend Imodium as needed.

## 2024-08-15 NOTE — Assessment & Plan Note (Signed)
 Continue Lovenox 1 mg/kg twice daily

## 2024-08-15 NOTE — Assessment & Plan Note (Signed)
 IV fluid hydration session 1 L of normal saline today. IV fluid hydration session as needed.

## 2024-08-15 NOTE — Progress Notes (Signed)
 Patient is states she's very tired & experiencing loose stools.

## 2024-08-15 NOTE — Assessment & Plan Note (Signed)
 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 This is likely associated with her Mediport. Continue Lovenox  1 mg/kg twice daily

## 2024-08-15 NOTE — Assessment & Plan Note (Addendum)
 Continue home antiemetics as needed.  Added lorazepam 0.5 every 12 hours as needed anxiety and nausea.  Potential side effects were reviewed with patient.

## 2024-08-15 NOTE — Assessment & Plan Note (Signed)
 Cough, possible bronchitis.  Continue inhalers.  Add trial of singular.   Recommend patient to follow-up with pulmonology.

## 2024-08-15 NOTE — Assessment & Plan Note (Signed)
 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  Per IR, right inguinal lymph node is not enlarged.  Recommend concurrent chemotherapy 5-FU and mitomycin  C with Radiation. S/p D1 5-FU and Mitomycin -C. Patient is on radiation.  Continue supportive care.

## 2024-08-15 NOTE — Progress Notes (Signed)
 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  Per IR, right inguinal lymph node is not enlarged.  Recommend concurrent chemotherapy 5-FU and mitomycin  C with Radiation. S/p D1 5-FU and Mitomycin -C. Patient is on radiation.  Continue supportive care.  AKI (acute kidney injury) IV fluid hydration session 1 L of normal saline today. IV fluid hydration session as needed.  Acute subsegmental pulmonary embolism (HCC) Continue Lovenox  1 mg/kg twice daily.  Acute deep vein thrombosis (DVT) of axillary vein of right upper extremity (HCC) acute occlusive DVT involving the right internal jugular and subclavian veins. This is likely associated with her Mediport. Continue Lovenox  1 mg/kg twice daily  Asthma Cough, possible bronchitis.  Continue inhalers.  Add trial of singular.   Recommend patient to follow-up with pulmonology.  Chemotherapy-induced nausea Continue home antiemetics as needed.  Added lorazepam 0.5 every 12 hours as needed anxiety and nausea.  Potential side effects were reviewed with patient.  Chemotherapy induced diarrhea Secondary to chemotherapy and radiation.  Recommend Imodium as needed.    Orders Placed This Encounter  Procedures   C difficile quick screen w PCR reflex    Standing Status:   Future    Number of Occurrences:   1    Expected Date:   08/15/2024    Expiration Date:   11/13/2024   Gastrointestinal Panel by PCR , Stool    Standing Status:   Future    Number of Occurrences:   1    Expected Date:   08/15/2024    Expiration Date:   11/13/2024   CBC with Differential (Cancer Center Only)    Standing Status:   Future    Expected Date:    08/22/2024    Expiration Date:   11/20/2024   CMP (Cancer Center only)    Standing Status:   Future    Expected Date:   08/22/2024    Expiration Date:   11/20/2024   Follow-up 1 week  lab NP IV fluid 2 weeks lab MD chemo All questions were answered. The patient knows to call the clinic with any problems, questions or concerns.  [name], MD, PhD [facility] Hematology Oncology 08/15/2024    HISTORY OF PRESENTING ILLNESS:  Monica Gonzalez 48 y.o. 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 Summary of oncologic history is as follows: Oncology History  Anal squamous cell carcinoma (HCC)  05/29/2024 Initial Diagnosis   Anal squamous cell carcinoma   Patient has noticed a hard mass at the anal area which she initially thought was a hemorrhoid.  The mass has been present for more than a month and appears to be enlarging. Patient was referred to establish care with surgeon Dr. Tye  05/16/2024, incisional biopsy of anal mass showed 1. Anus, biopsy, mass :       - INVASIVE MODERATELY DIFFERENTIATED SQUAMOUS CELL CARCINOMA  06/19/2024, left inguinal adenopathy biopsy showed Metastatic moderately differentiated squamous cell carcinoma.  '    05/29/2024 Cancer Staging   Staging form: Anus, AJCC V9 - Clinical stage from 05/29/2024: Stage IIIA (cT3, cN1a, cM0) -  Signed by Babara Call, MD on 06/26/2024 Stage prefix: Initial diagnosis   06/05/2024 Imaging   CT chest abdomen pelvis with contrast  Slight thickening in the area of the anal canal. Please correlate for history of neoplasm. There is a pathologic enlarged left inguinal node. There is a borderline right-sided node.   No additional areas of abnormal nodal enlargement or other aggressive appearing mass lesion at this time.   Fatty liver infiltration with known  segment 7 hepatic hemangioma.   Of note there is a right-sided upper breast mass which was not clearly seen on prior CT scan. Please correlate for any prior study or if needed diagnostic mammographic evaluation and possible ultrasound when appropriate.     06/12/2024 Imaging   CT chest abdomen pelvis with contrast showed Slight thickening in the area of the anal canal. Please correlate for history of neoplasm. There is a pathologic enlarged left inguinal node. There is a borderline right-sided node.   No additional areas of abnormal nodal enlargement or other aggressive appearing mass lesion at this time.   Fatty liver infiltration with known segment 7 hepatic hemangioma.   Of note there is a right-sided upper breast mass which was not clearly seen on prior CT scan. Please correlate for any prior study or if needed diagnostic mammographic evaluation and possible ultrasound when appropriate.   06/19/2024 Procedure   1. Lymph node, biopsy, left inguinal adenopathy :       -  METASTATIC MODERATELY DIFFERENTIATED SQUAMOUS CELL CARCINOMA    07/01/2024 Imaging   PET scan showed 1. Hypermetabolic anal mass with hypermetabolic left inguinal adenopathy, compatible with anorectal carcinoma. 2. Hypermetabolic right breast nodule with recent biopsies on 06/18/2024. Please correlate with pathology report.   07/31/2024 -  Chemotherapy   Patient is on Treatment Plan : ANUS Mitomycin  D1,28 + 5FU D1-4, 28-31 q32d     Invasive carcinoma of breast (HCC)  06/12/2024 Imaging   CT chest abdomen pelvis with contrast showed Slight thickening in the area of the anal canal. Please correlate for history of neoplasm. There is a pathologic enlarged left inguinal node. There is a borderline right-sided node.   No additional areas of abnormal nodal enlargement or other aggressive appearing mass lesion at this time.   Fatty liver infiltration with known segment 7 hepatic hemangioma.   Of note there is a  right-sided upper breast mass which was not clearly seen on prior CT scan. Please correlate for any prior study or if needed diagnostic mammographic evaluation and possible ultrasound when appropriate.   06/17/2024 Mammogram   1. There is a suspicious 17 mm mass at the site of CT and mammographic concern in the RIGHT breast. It demonstrates associated pleomorphic calcifications. Recommend ultrasound-guided biopsy for definitive characterization. 2. There is incidental sonographic note of a 7 mm mass in the RIGHT breast at 12 o'clock and a 6 mm non mass area at 9 o'clock. Recommend ultrasound-guided biopsy of these 2 areas for definitive characterization given suspicious appearance of the dominant mass. 3. No suspicious RIGHT axillary adenopathy. 4. No mammographic evidence of malignancy in the LEFT breast.   06/18/2024 Cancer Staging   Staging form: Breast, AJCC 8th Edition - Clinical stage from 06/18/2024: Stage IB (cT1b, cN0, cM0, G3, ER-, PR-, HER2-) - Signed by Babara Call, MD on 06/26/2024 Stage prefix: Initial diagnosis Histologic grading system: 3 grade system   06/18/2024 Initial Diagnosis   Invasive carcinoma of breast [facility])  Patient with right breast biopsy.  Pathology showed 1.  Breast, right, needle core biopsy, 12:00 12cmfn (heart clip) :      INVASIVE DUCTAL CARCINOMA      TUBULE FORMATION: SCORE 3      NUCLEAR PLEOMORPHISM: SCORE 3      MITOTIC COUNT: SCORE 2      TOTAL SCORE: 8      OVERALL GRADE: 3      LYMPHOVASCULAR INVASION: NOT IDENTIFIED      CANCER LENGTH: 1.0 CM      CALCIFICATIONS: NOT IDENTIFIED      OTHER FINDINGS: NONE      ER negative, PR negative, HER2 negative (IHC 0) Ki-67 95%.       2. Breast, right, needle core biopsy, 12:00 8cmfn (coil clip) :      INVASIVE DUCTAL CARCINOMA      DUCTAL CARCINOMA IN SITU, SOLID, HIGH NUCLEAR GRADE WITH NECROSIS      TUBULE FORMATION: SCORE 3      NUCLEAR PLEOMORPHISM: SCORE 3      MITOTIC COUNT: SCORE 2      TOTAL  SCORE: 8      OVERALL GRADE: 3      LYMPHOVASCULAR INVASION: NOT IDENTIFIED      CANCER LENGTH: 0.3 CM      CALCIFICATIONS: NOT IDENTIFIED      OTHER FINDINGS: NONE            3. Breast, right, needle core biopsy, 9:00 12cmfn (venus) :      BENIGN BREAST TISSUE WITH DENSE STROMAL FIBROSIS.      NEGATIVE FOR ATYPIA OR MALIGNANCY.    07/08/2024 Surgery   Patient went right breast lumpectomy and sentinel lymph node biopsy.  1. 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      - DUCTAL CARCINOMA IN SITU (DCIS).      - SEE CANCER SUMMARY AND NOTE BELOW.      - TWO BIOPSY SITES WITH CORRESPONDING HEART AND COIL CLIPS.      - TWO SAVI SCOUT TAGS.       2. Lymph node, sentinel, biopsy, Right :      - MICROMETASTATIC CARCINOMA INVOLVES ONE OF FIVE LYMPH NODES (1MI/5).      - SEE CANCER SUMMARY AND NOTE BELOW.       3. Breast, excision, Right medial posterior margin :      - BENIGN BREAST TISSUE.      - NEGATIVE FOR ATYPIA AND MALIGNANCY.      - SEE CANCER SUMMARY BELOW.   TUMOR Histologic Type: Invasive carcinoma of no special type (ductal) Histologic Grade (Nottingham Histologic Score) Glandular (Acinar)/Tubular Differentiation: 3 Nuclear Pleomorphism: 3 Mitotic Rate: 3 Overall Grade: 3 Tumor Size: Greatest dimension of largest invasive focus: 19 mm Ductal Carcinoma In Situ (DCIS): Present, high-grade with comedonecrosis Lymphatic and/or Vascular Invasion: Present, extensive (LVI in 2 or more blocks)  Treatment Effect in the Breast: No known presurgical therapy MARGINS Margin Status for Invasive Carcinoma: Invasive Carcinoma involves inferior (unifocal) and superior (unifocal) margins and is 0.5 mm to anterior margin Margin Status for DCIS: All margins negative for DCIS Distance from DCIS to closest margin: 0.5 mm Specify closest margin: Inferior REGIONAL LYMPH NODES Regional Lymph Node Status: Tumor present in  regional lymph node(s) Number of Lymph Nodes with Macrometastases (greater than 2 mm): 0 Number of Lymph Nodes with Micrometastases (greater than 0.2 mm to 2 mm and/or greater than 200 cells): 1  Number of Lymph Nodes with Isolated Tumor Cells (0.2 mm or less OR 200 cells or less): 0 Size of Largest Metastatic Deposit: 1.9 mm Extranodal Extension: Not identified Total Number of Lymph Nodes Examined (sentinel and non-sentinel): 5 Number of Sentinel Nodes Examined: 5 DISTANT METASTASIS Distant Site(s) Involved, if applicable: Not applicable PATHOLOGIC STAGE CLASSIFICATION (pTNM, AJCC 8th Edition): Modified Classification: Not applicable pT Category: pT1c T Suffix: (m) multiple primary synchronous tumors in a single organ pN Category: pN78mi N Suffix: (sn) pM Category: Not applicable SPECIAL STUDIES Breast Biomarker Testing Performed on Previous Biopsy: [id number] (heart) Estrogen Receptor: Negative (0%) Progesterone Receptor: Negative (0%) HER2 IHC: Negative (0) Proliferation Marker Ki67: 95%   07/22/2024 Cancer Staging   Staging form: Breast, AJCC 8th Edition - Pathologic stage from 07/22/2024: Stage IB (pT1c, pN84mi(sn), cM0, G3, ER-, PR-, HER2-) - Signed by Babara Call, MD on 07/22/2024 Stage prefix: Initial diagnosis Method of lymph node assessment: Sentinel lymph node biopsy Multigene prognostic tests performed: None Histologic grading system: 3 grade system   07/25/2024 Surgery   Patient reports additional excision for positive margin.  1. Breast, excision, superior margin of right breast - RESIDUAL INVASIVE DUCTAL CARCINOMA, COMPLETELY EXCISED. - CARCINOMA PRESENT 2 MM FROM THE POSTERIOR MARGIN AND 4 MM FROM THE ANTERIOR AND SUPERIOR MARGINS. 2. Breast, excision, inferior margin of right breast - RESIDUAL INVASIVE DUCTAL CARCINOMA, COMPLETELY EXCISED. - CARCINOMA PRESENT 1 MM FROM THE NEW INFERIOR MARGIN.    Patient has a history of VIN 3 status post vulvectomy  07/11/2024,she  was evaluated by cardiology with palpitations and atypical chest pain. Chest pain deemed atypical and she went on to have a normal echo at 07/19/2024.   07/23/2024, patient was seen by symptom management for right neck pain.  She was seen by Tinnie Dawn.  Right upper extremity ultrasound was obtained which showed acute occlusive DVT involving the right internal jugular and subclavian veins.  Patient was started on Lovenox , she was supposed to be started on 1 mg/kg twice daily however was started off 40 mg twice daily which is a subtherapeutic dosage. She was asked to hold off the evening dose prior to her breast re-excision.  07/29/2024, patient presented emergency room due to shortness of breath and chest pain.  CTA angiogram showed subsegmental PE medially in the left lower lobe.  Shortness of breath was felt to be multifactorial secondary to asthma/bronchitis, pulmonary embolism.  Symptoms are better after supportive care and she was discharged on Lovenox  80 mg subcutaneously twice daily.  Today patient presented for post chemotherapy evaluation.  Patient is currently on radiation. + Chemotherapy-induced nausea, she takes on antiemetics with some relief. + Intermittent loose stools, she uses Imodium. + Appetite is not good.  Feels dehydrated. + She still has a cough and uses inhalers and cough suppressants.   MEDICAL HISTORY:  Past Medical History:  Diagnosis Date   Anemia    Anginal pain    Anxiety    Arthritis    Asthma    Cancer (HCC)    Chronic pain syndrome 05/21/2023   DDD (degenerative disc disease), cervical 05/22/2023   Deep venous thrombosis (HCC)    Depression    Dyspnea    Fibromyalgia    GERD (gastroesophageal reflux disease)    Headache    Heart murmur    History of hiatal hernia    History of kidney stones    Lupus    Malignant neoplasm of upper-outer quadrant of right female breast, unspecified estrogen receptor status (  HCC) 2025   Pneumonia    PTSD  (post-traumatic stress disorder)    Skin candidiasis 05/29/2024   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    SURGICAL HISTORY: Past Surgical History:  Procedure Laterality Date   BREAST BIOPSY Right 06/18/2024   US  RT BREAST BX W LOC DEV 1ST LESION IMG BX SPEC US  GUIDE 06/18/2024 [facility]   BREAST BIOPSY Right 06/18/2024   US  RT BREAST BX W LOC DEV EA ADD LESION IMG BX SPEC US  GUIDE 06/18/2024 [facility]   BREAST BIOPSY Right 06/18/2024   US  RT BREAST BX W LOC DEV EA ADD LESION IMG BX SPEC US  GUIDE 06/18/2024 [facility]   BREAST BIOPSY Right 07/05/2024   US  RT BREAST SAVI/RF TAG 1ST LESION US  GUIDE 07/05/2024 [facility]   BREAST BIOPSY Right 07/05/2024   US  RT BREAST SAVI/RF TAG EA ADD'L LESION US  GUIDE 07/05/2024 [facility]   BREAST LUMPECTOMY Right 07/25/2024   Procedure: BREAST LUMPECTOMY;  Surgeon: Tye Millet, DO;  Location: [facility];  Service: General;  Laterality: Right;   BREAST LUMPECTOMY WITH RADIO FREQUENCY LOCALIZER Right 07/08/2024   Procedure: BREAST LUMPECTOMY WITH RADIO FREQUENCY LOCALIZER;  Surgeon: Tye Millet, DO;  Location: [facility];  Service: General;  Laterality: Right;   COLONOSCOPY WITH PROPOFOL  N/A 02/17/2023   Procedure: COLONOSCOPY WITH PROPOFOL ;  Surgeon: Maryruth Ole DASEN, MD;  Location: [facility];  Service: Endoscopy;  Laterality: N/A;   ESOPHAGOGASTRODUODENOSCOPY (EGD) WITH PROPOFOL  N/A 02/17/2023   Procedure: ESOPHAGOGASTRODUODENOSCOPY (EGD) WITH PROPOFOL ;  Surgeon: Maryruth Ole DASEN, MD;  Location: [facility];  Service: Endoscopy;  Laterality: N/A;   EXTRACORPOREAL SHOCK WAVE LITHOTRIPSY     PORTACATH PLACEMENT Right 07/08/2024   Procedure: INSERTION, TUNNELED CENTRAL VENOUS DEVICE, WITH PORT;  Surgeon: Tye Millet, DO;  Location: [facility];  Service: General;  Laterality: Right;   RECTAL EXAM UNDER ANESTHESIA N/A 05/16/2024   Procedure: EXAM UNDER ANESTHESIA, RECTUM, INCISIONAL BIOPSY OF ANAL MASS;   Surgeon: Tye Millet, DO;  Location: [facility];  Service: General;  Laterality: N/A;   SENTINEL NODE BIOPSY Right 07/08/2024   Procedure: BIOPSY, LYMPH NODE, SENTINEL;  Surgeon: Tye Millet, DO;  Location: [facility];  Service: General;  Laterality: Right;   TONSILLECTOMY     TUBAL LIGATION     VULVECTOMY N/A 07/29/2020   Procedure: WIDE EXCISION VULVECTOMY;  Surgeon: Mancil Barter, MD;  Location: [facility];  Service: Gynecology;  Laterality: N/A;    SOCIAL HISTORY: Social History   Socioeconomic History   Marital status: Divorced    Spouse name: Not on file   Number of children: 1   Years of education: Not on file   Highest education level: Not on file  Occupational History   Not on file  Tobacco Use   Smoking status: Never   Smokeless tobacco: Never  Vaping Use   Vaping status: Never Used  Substance and Sexual Activity   Alcohol use: Not Currently   Drug use: No   Sexual activity: Yes  Other Topics Concern   Not on file  Social History Narrative   Not on file   Social Drivers of Health   Financial Resource Strain: Medium Risk (05/15/2024)   Received from [facility]   Overall Financial Resource Strain (CARDIA)    Difficulty of Paying Living Expenses: Somewhat hard  Food Insecurity: No Food Insecurity (05/29/2024)   Hunger Vital Sign    Worried About Running Out of Food in the Last Year: Never true    Ran Out  of Food in the Last Year: Never true  Transportation Needs: No Transportation Needs (05/29/2024)   PRAPARE - Administrator, Civil Service (Medical): No    Lack of Transportation (Non-Medical): No  Physical Activity: Not on file  Stress: Not on file  Social Connections: Not on file  Intimate Partner Violence: Not At Risk (05/29/2024)   Humiliation, Afraid, Rape, and Kick questionnaire    Fear of Current or Ex-Partner: No    Emotionally Abused: No    Physically Abused: No    Sexually Abused: No    FAMILY HISTORY: Family History   Problem Relation Age of Onset   Diabetes Mother    Congestive Heart Failure Mother    Colon cancer Father 97   Lupus Maternal Grandmother    Cancer Maternal Grandfather        either prostate or colon   Colon cancer Paternal Grandfather    Lung cancer Paternal Grandfather     ALLERGIES:  is allergic to prednisone and silicone.  MEDICATIONS:  Current Outpatient Medications  Medication Sig Dispense Refill   enoxaparin  (LOVENOX ) 80 MG/0.8ML injection Inject 0.8 mLs (80 mg total) into the skin every 12 (twelve) hours. 48 mL 1   LORazepam (ATIVAN) 0.5 MG tablet Take 1 tablet (0.5 mg total) by mouth every 12 (twelve) hours as needed for anxiety (nausea). 30 tablet 0   albuterol  (PROVENTIL ) (2.5 MG/3ML) 0.083% nebulizer solution Take 2.5 mg by nebulization every 6 (six) hours as needed for shortness of breath or wheezing.     albuterol  (VENTOLIN  HFA) 108 (90 Base) MCG/ACT inhaler Inhale 2 puffs into the lungs every 4 (four) hours as needed for wheezing or shortness of breath. 8.5 g 2   ascorbic acid (VITAMIN C) 500 MG tablet Take 500 mg by mouth daily.     B Complex-C (SUPER B COMPLEX PO) Take by mouth daily.     benzonatate  (TESSALON ) 200 MG capsule Take 1 capsule (200 mg total) by mouth 3 (three) times daily as needed for cough. 30 capsule 1   BIOTIN PO Take by mouth daily.     budesonide  (PULMICORT ) 0.25 MG/2ML nebulizer solution Take 2 mLs (0.25 mg total) by nebulization 2 (two) times daily. 60 mL 12   busPIRone  (BUSPAR ) 5 MG tablet Take 5 mg by mouth 2 (two) times daily.     cholecalciferol (VITAMIN D3) 25 MCG (1000 UNIT) tablet Take 1,000 Units by mouth daily.     clonazePAM (KLONOPIN) 0.5 MG tablet Take 0.5 mg by mouth at bedtime as needed (anxiety/sleep).     DULoxetine  (CYMBALTA ) 30 MG capsule Take 30 mg by mouth at bedtime.     esomeprazole  (NEXIUM ) 40 MG capsule Take 1 capsule (40 mg total) by mouth 2 (two) times daily before a meal. 60 capsule 2   ferrous sulfate 325 (65 FE) MG  tablet Take 325 mg by mouth daily.      gabapentin  (NEURONTIN ) 300 MG capsule Take 600 mg by mouth 2 (two) times daily.     guaiFENesin -codeine  100-10 MG/5ML syrup Take 5 mLs by mouth every 6 (six) hours as needed for cough. 120 mL 0   ipratropium (ATROVENT  HFA) 17 MCG/ACT inhaler Inhale 2 puffs into the lungs every 6 (six) hours as needed for wheezing (COUGH). 12.9 g 1   ipratropium-albuterol  (DUONEB) 0.5-2.5 (3) MG/3ML SOLN Take 3 mLs by nebulization 4 (four) times daily. 360 mL 3   lidocaine  (LIDODERM ) 5 % 1 patch daily.     lidocaine  (  XYLOCAINE ) 2 % solution Use as directed 15 mLs in the mouth or throat every 4 (four) hours as needed for mouth pain. 500 mL 0   lidocaine -prilocaine  (EMLA ) cream Apply to affected area once 30 g 3   linaclotide (LINZESS) 145 MCG CAPS capsule Take 145 mcg by mouth daily before breakfast.     magic mouthwash (multi-ingredient) oral suspension Swish/Spit 5-10 mLs by mouth 4 (four) times daily as needed for mouth pain. 480 mL 1   montelukast  (SINGULAIR ) 10 MG tablet Take 1 tablet (10 mg total) by mouth See admin instructions. Take 1 tablet on the day prior to chemotherapy and take 1 tablet daily for 2 days after chemotherapy. 30 tablet 0   Multiple Vitamin (MULTIVITAMIN WITH MINERALS) TABS tablet Take 1 tablet by mouth daily.     [Paused] naproxen  (EC NAPROSYN ) 500 MG EC tablet Take 1 tablet (500 mg total) by mouth 2 (two) times daily with a meal. 60 tablet 0   nystatin  (MYCOSTATIN ) 100000 UNIT/ML suspension Take 5 mLs (500,000 Units total) by mouth 4 (four) times daily. Swish and swallow 473 mL 1   nystatin  (MYCOSTATIN /NYSTOP ) powder Apply 1 Application topically 2 (two) times daily. 20 g 0   ondansetron  (ZOFRAN ) 8 MG tablet Take 1 tablet (8 mg total) by mouth every 8 (eight) hours as needed for nausea or vomiting. 30 tablet 1   potassium chloride  SA (KLOR-CON  M) 20 MEQ tablet Take 1 tablet (20 mEq total) by mouth daily. 10 tablet 0   prochlorperazine  (COMPAZINE ) 10  MG tablet Take 1 tablet (10 mg total) by mouth every 6 (six) hours as needed for nausea or vomiting. 30 tablet 1   promethazine -dextromethorphan  (PROMETHAZINE -DM) 6.25-15 MG/5ML syrup Take 5 mLs by mouth 4 (four) times daily as needed for cough. 180 mL 0   sucralfate  (CARAFATE ) 1 g tablet Take 1 tablet (1 g total) by mouth 4 (four) times daily -  with meals and at bedtime. Dissolve in water  30 tablet 1   traMADol  (ULTRAM ) 50 MG tablet Take 1 tablet (50 mg total) by mouth every 8 (eight) hours as needed. 15 tablet 0   traZODone  (DESYREL ) 50 MG tablet Take 50 mg by mouth at bedtime.     Turmeric (QC TUMERIC COMPLEX PO) Take by mouth daily.     No current facility-administered medications for this visit.    Review of Systems  Constitutional:  Positive for fatigue. Negative for chills and fever.  HENT:   Negative for hearing loss and voice change.   Eyes:  Negative for eye problems.  Respiratory:  Positive for cough. Negative for chest tightness and shortness of breath.   Cardiovascular:  Negative for chest pain.  Gastrointestinal:  Positive for diarrhea and nausea. Negative for abdominal distention, abdominal pain and blood in stool.       Anal mass with drainage  Endocrine: Negative for hot flashes.  Genitourinary:  Negative for difficulty urinating and frequency.   Musculoskeletal:  Negative for arthralgias.  Skin:  Negative for itching and rash.  Neurological:  Negative for extremity weakness.  Hematological:  Negative for adenopathy.  Psychiatric/Behavioral:  Negative for confusion.      PHYSICAL EXAMINATION: ECOG PERFORMANCE STATUS: 0 - Asymptomatic  Vitals:   08/15/24 1434  BP: (!) 97/59  Pulse: 96  Resp: 19  Temp: 98.3 F (36.8 C)  SpO2: 98%   Filed Weights   08/15/24 1434  Weight: 195 lb 1.6 oz (88.5 kg)    Physical Exam Constitutional:  General: She is not in acute distress.    Appearance: She is not diaphoretic.  HENT:     Head: Normocephalic and  atraumatic.     Mouth/Throat:     Pharynx: No oropharyngeal exudate.  Eyes:     General: No scleral icterus.    Pupils: Pupils are equal, round, and reactive to light.  Cardiovascular:     Rate and Rhythm: Normal rate and regular rhythm.     Heart sounds: No murmur heard. Pulmonary:     Effort: Pulmonary effort is normal. No respiratory distress.     Breath sounds: No wheezing.  Abdominal:     General: There is no distension.     Palpations: Abdomen is soft.     Tenderness: There is no abdominal tenderness.  Genitourinary:    Comments: Firm anal mass 4-5cm Musculoskeletal:        General: Normal range of motion.     Cervical back: Normal range of motion and neck supple.  Skin:    General: Skin is warm and dry.     Findings: No erythema.  Neurological:     Mental Status: She is alert and oriented to person, place, and time. Mental status is at baseline.     Motor: No abnormal muscle tone.  Psychiatric:        Mood and Affect: Affect normal.         LABORATORY DATA:  I have reviewed the data as listed    Latest Ref Rng & Units 08/15/2024    2:18 PM 08/13/2024    2:39 PM 08/07/2024    3:11 PM  CBC  WBC 4.0 - 10.5 K/uL 2.9  3.4  5.4   Hemoglobin 12.0 - 15.0 g/dL 88.7  88.7  87.6   Hematocrit 36.0 - 46.0 % 34.9  35.3  38.2   Platelets 150 - 400 K/uL 211  249  282       Latest Ref Rng & Units 08/15/2024    2:18 PM 08/07/2024    3:11 PM 07/31/2024    1:28 PM  CMP  Glucose 70 - 99 mg/dL 896  864  878   BUN 6 - 20 mg/dL 7  9  9    Creatinine 0.44 - 1.00 mg/dL 8.87  9.04  9.08   Sodium 135 - 145 mmol/L 136  134  135   Potassium 3.5 - 5.1 mmol/L 3.7  3.3  3.3   Chloride 98 - 111 mmol/L 104  101  102   CO2 22 - 32 mmol/L 26  25  24    Calcium 8.9 - 10.3 mg/dL 8.6  8.7  8.4   Total Protein 6.5 - 8.1 g/dL 6.3  6.9  6.2   Total Bilirubin 0.0 - 1.2 mg/dL 0.6  1.0  0.8   Alkaline Phos 38 - 126 U/L 76  93  74   AST 15 - 41 U/L 24  43  23   ALT 0 - 44 U/L 24  57  18       RADIOGRAPHIC STUDIES: I have personally reviewed the radiological images as listed and agreed with the findings in the report. DG Chest 2 View Result Date: 08/13/2024 CLINICAL DATA:  Port-A-Cath placement. EXAM: CHEST - 2 VIEW COMPARISON:  July 29, 2024. FINDINGS: The heart size and mediastinal contours are within normal limits. Stable position of right internal jugular Port-A-Cath tip in expected position of upper SVC. Both lungs are clear. The visualized skeletal structures are unremarkable. IMPRESSION: No  active cardiopulmonary disease. Electronically Signed   By: Lynwood Landy Raddle M.D.   On: 08/13/2024 15:26   DG Shoulder Right Result Date: 08/08/2024 CLINICAL DATA:  right shoulder pain EXAM: RIGHT SHOULDER - 2+ VIEW COMPARISON:  None Available. FINDINGS: There is no evidence of fracture or dislocation. There is no evidence of arthropathy or aggressive appearing focal bone abnormality. Soft tissues are unremarkable. Accessed right chest wall catheter with tip overlying the expected region of the superior vena cava. IMPRESSION: No acute displaced fracture or dislocation. Electronically Signed   By: Morgane  Naveau M.D.   On: 08/08/2024 19:50   CT Angio Chest PE W and/or Wo Contrast Addendum Date: 07/29/2024 ADDENDUM REPORT: 07/29/2024 18:47 ADDENDUM: The original report was by Dr. Ryan Salvage. The following addendum is by Dr. Ryan Salvage: Critical Value/emergent results were called by telephone at the time of interpretation on 07/29/2024 at 6:47 pm to provider MARK QUALE , who verbally acknowledged these results. Electronically Signed   By: Ryan Salvage M.D.   On: 07/29/2024 18:47   Result Date: 07/29/2024 CLINICAL DATA:  Deep vein thrombosis, dyspnea, high probability of pulmonary embolus. Recent diagnosis of anal squamous cell carcinoma. * Tracking Code: BO * EXAM: CT ANGIOGRAPHY CHEST WITH CONTRAST TECHNIQUE: Multidetector CT imaging of the chest was performed using the  standard protocol during bolus administration of intravenous contrast. Multiplanar CT image reconstructions and MIPs were obtained to evaluate the vascular anatomy. RADIATION DOSE REDUCTION: This exam was performed according to the departmental dose-optimization program which includes automated exposure control, adjustment of the mA and/or kV according to patient size and/or use of iterative reconstruction technique. CONTRAST:  75mL OMNIPAQUE  IOHEXOL  350 MG/ML SOLN COMPARISON:  06/30/2024 FINDINGS: Cardiovascular: Right Port-A-Cath tip: Right internal jugular vein just above the brachiocephalic confluence. Surrounding hypodensity, persistent thrombus in the right internal jugular vein not excluded. Acute subsegmental pulmonary embolus medially in the left lower lobe as on images 180-201 of series 6. No lobar or larger embolus identified. Mediastinum/Nodes: Unremarkable Lungs/Pleura: Mild subsegmental atelectasis in the right lower lobe. Upper Abdomen: Unremarkable Musculoskeletal: Unremarkable Review of the MIP images confirms the above findings. IMPRESSION: 1. Acute subsegmental pulmonary embolus medially in the left lower lobe. No lobar or larger embolus identified. 2. Right Port-A-Cath tip: Right internal jugular vein just above the brachiocephalic confluence. Surrounding hypodensity in the right internal jugular vein, persistent thrombus in the right internal jugular vein (as shown on ultrasound exam of 07/23/2024) not excluded. 3. Mild subsegmental atelectasis in the right lower lobe. Radiology assistant personnel have been notified to put me in telephone contact with the referring physician or the referring physician's clinical representative in order to discuss these findings. Once this communication is established I will issue an addendum to this report for documentation purposes. Electronically Signed: By: Ryan Salvage M.D. On: 07/29/2024 18:38   DG Chest 2 View Result Date: 07/29/2024 CLINICAL DATA:   Shortness of breath. EXAM: CHEST - 2 VIEW COMPARISON:  07/08/2024, CT 06/30/2024 FINDINGS: Stable positioning of right chest port with tip overlying the upper SVC. Normal heart size with stable mediastinal contours. No focal airspace disease, pleural effusion, pulmonary edema or pneumothorax. No acute osseous abnormalities are seen. IMPRESSION: No acute chest findings. Electronically Signed   By: Andrea Gasman M.D.   On: 07/29/2024 16:39   US  Venous Img Upper Uni Right Result Date: 07/23/2024 CLINICAL DATA:  Right neck pain at Port-A-Cath site EXAM: Right UPPER EXTREMITY VENOUS DOPPLER ULTRASOUND TECHNIQUE: Gray-scale sonography with graded compression, as  well as color Doppler and duplex ultrasound were performed to evaluate the upper extremity deep venous system from the level of the subclavian vein and including the jugular, axillary, basilic, radial, ulnar and upper cephalic vein. Spectral Doppler was utilized to evaluate flow at rest and with distal augmentation maneuvers. COMPARISON:  None Available. FINDINGS: Contralateral Subclavian Vein: Respiratory phasicity is normal and symmetric with the symptomatic side. No evidence of thrombus. Normal compressibility. Internal Jugular Vein: Positive for acute occlusive thrombus. Incompletely compressible Subclavian Vein: Positive for acute occlusive thrombus. Axillary Vein: No evidence of thrombus. Normal compressibility, respiratory phasicity and response to augmentation. Cephalic Vein: No evidence of thrombus. Normal compressibility, respiratory phasicity and response to augmentation. Basilic Vein: No evidence of thrombus. Normal compressibility, respiratory phasicity and response to augmentation. Brachial Veins: No evidence of thrombus. Normal compressibility, respiratory phasicity and response to augmentation. Radial Veins: No evidence of thrombus. Normal compressibility, respiratory phasicity and response to augmentation. Ulnar Veins: No evidence of  thrombus. Normal compressibility, respiratory phasicity and response to augmentation. IMPRESSION: Positive for acute occlusive DVT involving the right internal jugular and subclavian veins. Critical Value/emergent results were called by telephone at the time of interpretation on 07/23/2024 at 3:38 pm to provider TINNIE DAWN , who verbally acknowledged these results. Electronically Signed   By: Luke Bun M.D.   On: 07/23/2024 15:39

## 2024-08-16 ENCOUNTER — Ambulatory Visit
Admission: RE | Admit: 2024-08-16 | Discharge: 2024-08-16 | Disposition: A | Discharge status: Home / Self Care | Source: Ambulatory Visit | Attending: Radiation Oncology | Admitting: Radiation Oncology

## 2024-08-16 ENCOUNTER — Other Ambulatory Visit: Payer: Self-pay

## 2024-08-16 DIAGNOSIS — C210 Malignant neoplasm of anus, unspecified: Secondary | ICD-10-CM | POA: Diagnosis not present

## 2024-08-16 LAB — RAD ONC ARIA SESSION SUMMARY
Course Elapsed Days: 15
Plan Fractions Treated to Date: 12
Plan Prescribed Dose Per Fraction: 1.8 Gy
Plan Total Fractions Prescribed: 30
Plan Total Prescribed Dose: 54 Gy
Reference Point Dosage Given to Date: 21.6 Gy
Reference Point Session Dosage Given: 1.8 Gy
Session Number: 12

## 2024-08-17 ENCOUNTER — Telehealth: Payer: Self-pay | Admitting: Internal Medicine

## 2024-08-17 ENCOUNTER — Emergency Department

## 2024-08-17 ENCOUNTER — Other Ambulatory Visit: Payer: Self-pay

## 2024-08-17 ENCOUNTER — Emergency Department
Admission: EM | Admit: 2024-08-17 | Discharge: 2024-08-18 | Disposition: A | Attending: Emergency Medicine | Admitting: Emergency Medicine

## 2024-08-17 DIAGNOSIS — D72819 Decreased white blood cell count, unspecified: Secondary | ICD-10-CM | POA: Insufficient documentation

## 2024-08-17 DIAGNOSIS — R197 Diarrhea, unspecified: Secondary | ICD-10-CM | POA: Diagnosis present

## 2024-08-17 DIAGNOSIS — R1084 Generalized abdominal pain: Secondary | ICD-10-CM | POA: Insufficient documentation

## 2024-08-17 DIAGNOSIS — R531 Weakness: Secondary | ICD-10-CM | POA: Insufficient documentation

## 2024-08-17 LAB — URINALYSIS, ROUTINE W REFLEX MICROSCOPIC
Bilirubin Urine: NEGATIVE
Glucose, UA: NEGATIVE mg/dL
Hgb urine dipstick: NEGATIVE
Ketones, ur: NEGATIVE mg/dL
Leukocytes,Ua: NEGATIVE
Nitrite: NEGATIVE
Protein, ur: NEGATIVE mg/dL
Specific Gravity, Urine: 1.001 — ABNORMAL LOW (ref 1.005–1.030)
pH: 6 (ref 5.0–8.0)

## 2024-08-17 LAB — CBC
HCT: 37.9 % (ref 36.0–46.0)
Hemoglobin: 12 g/dL (ref 12.0–15.0)
MCH: 31.3 pg (ref 26.0–34.0)
MCHC: 31.7 g/dL (ref 30.0–36.0)
MCV: 99 fL (ref 80.0–100.0)
Platelets: 226 K/uL (ref 150–400)
RBC: 3.83 MIL/uL — ABNORMAL LOW (ref 3.87–5.11)
RDW: 16 % — ABNORMAL HIGH (ref 11.5–15.5)
WBC: 2.9 K/uL — ABNORMAL LOW (ref 4.0–10.5)
nRBC: 0 % (ref 0.0–0.2)

## 2024-08-17 LAB — COMPREHENSIVE METABOLIC PANEL WITH GFR
ALT: 24 U/L (ref 0–44)
AST: 26 U/L (ref 15–41)
Albumin: 3.4 g/dL — ABNORMAL LOW (ref 3.5–5.0)
Alkaline Phosphatase: 78 U/L (ref 38–126)
Anion gap: 10 (ref 5–15)
BUN: 5 mg/dL — ABNORMAL LOW (ref 6–20)
CO2: 24 mmol/L (ref 22–32)
Calcium: 8.7 mg/dL — ABNORMAL LOW (ref 8.9–10.3)
Chloride: 104 mmol/L (ref 98–111)
Creatinine, Ser: 1.12 mg/dL — ABNORMAL HIGH (ref 0.44–1.00)
GFR, Estimated: >60 mL/min (ref >60)
Glucose, Bld: 127 mg/dL — ABNORMAL HIGH (ref 70–99)
Potassium: 3.7 mmol/L (ref 3.5–5.1)
Sodium: 138 mmol/L (ref 135–145)
Total Bilirubin: 0.6 mg/dL (ref 0.0–1.2)
Total Protein: 6.5 g/dL (ref 6.5–8.1)

## 2024-08-17 LAB — POC URINE PREG, ED: Preg Test, Ur: NEGATIVE

## 2024-08-17 MED ORDER — MORPHINE SULFATE (PF) 4 MG/ML IV SOLN
4.0000 mg | Freq: Once | INTRAVENOUS | Status: AC
  Administered 2024-08-17: 4 mg via INTRAVENOUS
  Filled 2024-08-17: qty 1

## 2024-08-17 MED ORDER — LACTATED RINGERS IV BOLUS
2000.0000 mL | Freq: Once | INTRAVENOUS | Status: AC
  Administered 2024-08-17: 2000 mL via INTRAVENOUS

## 2024-08-17 MED ORDER — CHLORHEXIDINE GLUCONATE CLOTH 2 % EX PADS: 6.0000 | MEDICATED_PAD | Freq: Every day | CUTANEOUS | Status: DC

## 2024-08-17 MED ORDER — SODIUM CHLORIDE 0.9% FLUSH
10.0000 mL | Freq: Two times a day (BID) | INTRAVENOUS | Status: DC
  Administered 2024-08-17: 10 mL

## 2024-08-17 MED ORDER — IOHEXOL 300 MG/ML  SOLN
100.0000 mL | Freq: Once | INTRAMUSCULAR | Status: AC | PRN
Start: 2024-08-17 — End: 2024-08-17
  Administered 2024-08-17: 100 mL via INTRAVENOUS

## 2024-08-17 MED ORDER — SODIUM CHLORIDE 0.9% FLUSH: 10.0000 mL | INTRAVENOUS | Status: DC | PRN

## 2024-08-17 MED ORDER — ONDANSETRON HCL 4 MG/2ML IJ SOLN
4.0000 mg | Freq: Once | INTRAMUSCULAR | Status: AC
  Administered 2024-08-17: 4 mg via INTRAVENOUS
  Filled 2024-08-17: qty 2

## 2024-08-17 NOTE — ED Notes (Signed)
 ED provider notified of pts port not giving blood return when first accessed tonight and pt mentioning she's had trouble with blood return in the past.

## 2024-08-17 NOTE — ED Triage Notes (Signed)
 Pt to ed from home via POV for loose stools that are green. Pt is caox4, in no acute distress and ambulatory to triage. Pt is also nauseated with some abd pain. Pt is caox4, in no acute distress and ambulatory to triage.

## 2024-08-17 NOTE — ED Notes (Signed)
 Patient transported to CT

## 2024-08-17 NOTE — ED Provider Notes (Signed)
 [facility] Provider Note    Event Date/Time   First MD Initiated Contact with Patient 08/17/24 2146     (approximate)   History   Diarrhea   HPI  Monica Gonzalez is a 48 y.o. female who presents to the ED for evaluation of Diarrhea   Review in oncology clinic visit from 2 days ago.  History of anal squamous cell carcinoma, stage III, undergoing chemotherapy and radiation.  Acute PE, RUE DVT on Lovenox  1 mg/kg twice daily  Patient presents with copious green-colored diarrhea over the past day or 2 concerns for dehydration.  Reports generalized weakness of presyncope without syncope.  Generalized abdominal cramping and discomfort.  Nausea but no emesis.  No recent antibiotics.  No fevers.  No urinary changes   Physical Exam   Triage Vital Signs: ED Triage Vitals [08/17/24 2132]  Encounter Vitals Group     BP 121/80     Girls Systolic BP Percentile      Girls Diastolic BP Percentile      Boys Systolic BP Percentile      Boys Diastolic BP Percentile      Pulse Rate 95     Resp 16     Temp 98.4 F (36.9 C)     Temp src      SpO2 99 %     Weight      Height      Head Circumference      Peak Flow      Pain Score 0     Pain Loc      Pain Education      Exclude from Growth Chart     Most recent vital signs: Vitals:   08/17/24 2245 08/17/24 2315  BP:    Pulse: 87 84  Resp:    Temp:    SpO2: 100% 100%    General: Awake, no distress.  Dry mucous membranes CV:  Good peripheral perfusion.  Resp:  Normal effort.  Abd:  No distention.  Diffuse and mild tenderness MSK:  No deformity noted.  Neuro:  No focal deficits appreciated. Other:     ED Results / Procedures / Treatments   Labs (all labs ordered are listed, but only abnormal results are displayed) Labs Reviewed  COMPREHENSIVE METABOLIC PANEL WITH GFR - Abnormal; Notable for the following components:      Result Value   Glucose, Bld 127 (*)    BUN 5 (*)    Creatinine, Ser 1.12  (*)    Calcium 8.7 (*)    Albumin 3.4 (*)    All other components within normal limits  CBC - Abnormal; Notable for the following components:   WBC 2.9 (*)    RBC 3.83 (*)    RDW 16.0 (*)    All other components within normal limits  URINALYSIS, ROUTINE W REFLEX MICROSCOPIC - Abnormal; Notable for the following components:   Color, Urine COLORLESS (*)    APPearance HAZY (*)    Specific Gravity, Urine 1.001 (*)    All other components within normal limits  LIPASE, BLOOD  POC URINE PREG, ED    EKG   RADIOLOGY CT pending  Official radiology report(s): No results found.  PROCEDURES and INTERVENTIONS:  Procedures  Medications  sodium chloride  flush (NS) 0.9 % injection 10-40 mL (10 mLs Intracatheter Given 08/17/24 2303)  sodium chloride  flush (NS) 0.9 % injection 10-40 mL (has no administration in time range)  Chlorhexidine  Gluconate Cloth 2 % PADS  6 each (has no administration in time range)  lactated ringers  bolus 2,000 mL (2,000 mLs Intravenous New Bag/Given 08/17/24 2257)  ondansetron  (ZOFRAN ) injection 4 mg (4 mg Intravenous Given 08/17/24 2254)  morphine  (PF) 4 MG/ML injection 4 mg (4 mg Intravenous Given 08/17/24 2252)  iohexol  (OMNIPAQUE ) 300 MG/ML solution 100 mL (100 mLs Intravenous Contrast Given 08/17/24 2321)     IMPRESSION / MDM / ASSESSMENT AND PLAN / ED COURSE  I reviewed the triage vital signs and the nursing notes.  Differential diagnosis includes, but is not limited to, dehydration, gastroenteritis, UTI, viral syndrome, SBO  {Patient presents with symptoms of an acute illness or injury that is potentially life-threatening.  Cancer patient undergoing chemotherapy and radiation presents with recurrent diarrhea.  Appears dry.  Mild worsening of baseline renal function.  Leukopenia.  Urine without infectious features.  CT pending at the time of signout to oncoming physician as she receives IV fluids and antiemetics.      FINAL CLINICAL IMPRESSION(S) / ED  DIAGNOSES   Final diagnoses:  None     Rx / DC Orders   ED Discharge Orders     None        Note:  This document was prepared using Dragon voice recognition software and may include unintentional dictation errors.   Claudene Rover, MD 08/17/24 [phone]

## 2024-08-17 NOTE — ED Notes (Signed)
IV team RN in room at this time.

## 2024-08-17 NOTE — Telephone Encounter (Signed)
 Pt called re: worsening diarrhea/diarrhea- nausea-cramping abdomen- not improved with immoduim/ anti-emetics. Pt recommend go to ER. informed primary team  Please follow up- GB

## 2024-08-17 NOTE — ED Provider Notes (Signed)
-----------------------------------------   11:04 PM on 08/17/2024 -----------------------------------------  Blood pressure 121/80, pulse 95, temperature 98.4 F (36.9 C), resp. rate 16, last menstrual period 06/26/2024, SpO2 99%.  Assuming care from Dr. Claudene.  In short, Monica Gonzalez is a 48 y.o. female with a chief complaint of Diarrhea .  Refer to the original H&P for additional details.  The current plan of care is to follow-up CT abdomen/pelvis.  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  Symptoms improved on reassessment and patient tolerating oral intake.  She is appropriate for discharge home with outpatient follow-up, was counseled to return to the ED for new or worsening symptoms.  Patient agrees with plan.       Willo Dunnings, MD 08/18/24 [phone]

## 2024-08-18 LAB — LIPASE, BLOOD: Lipase: 24 U/L (ref 11–51)

## 2024-08-18 MED ORDER — HEPARIN SOD (PORK) LOCK FLUSH 100 UNIT/ML IV SOLN
500.0000 [IU] | Freq: Once | INTRAVENOUS | Status: AC
Start: 2024-08-18 — End: 2024-08-18
  Administered 2024-08-18: 500 [IU] via INTRAVENOUS
  Filled 2024-08-18: qty 5

## 2024-08-19 ENCOUNTER — Inpatient Hospital Stay

## 2024-08-19 ENCOUNTER — Inpatient Hospital Stay (HOSPITAL_BASED_OUTPATIENT_CLINIC_OR_DEPARTMENT_OTHER): Admitting: Oncology

## 2024-08-19 ENCOUNTER — Other Ambulatory Visit: Payer: Self-pay

## 2024-08-19 ENCOUNTER — Encounter: Payer: Self-pay | Admitting: Oncology

## 2024-08-19 ENCOUNTER — Ambulatory Visit
Admission: RE | Admit: 2024-08-19 | Discharge: 2024-08-19 | Disposition: A | Discharge status: Home / Self Care | Source: Ambulatory Visit | Attending: Radiation Oncology | Admitting: Radiation Oncology

## 2024-08-19 VITALS — BP 106/90 | HR 87 | Temp 98.6°F | Resp 18

## 2024-08-19 DIAGNOSIS — R197 Diarrhea, unspecified: Secondary | ICD-10-CM

## 2024-08-19 DIAGNOSIS — C50919 Malignant neoplasm of unspecified site of unspecified female breast: Secondary | ICD-10-CM

## 2024-08-19 DIAGNOSIS — Z95828 Presence of other vascular implants and grafts: Secondary | ICD-10-CM | POA: Diagnosis not present

## 2024-08-19 DIAGNOSIS — K6289 Other specified diseases of anus and rectum: Secondary | ICD-10-CM | POA: Diagnosis not present

## 2024-08-19 DIAGNOSIS — R110 Nausea: Secondary | ICD-10-CM | POA: Diagnosis not present

## 2024-08-19 DIAGNOSIS — Z452 Encounter for adjustment and management of vascular access device: Secondary | ICD-10-CM

## 2024-08-19 DIAGNOSIS — E860 Dehydration: Secondary | ICD-10-CM

## 2024-08-19 DIAGNOSIS — C210 Malignant neoplasm of anus, unspecified: Secondary | ICD-10-CM | POA: Diagnosis not present

## 2024-08-19 LAB — RAD ONC ARIA SESSION SUMMARY
Course Elapsed Days: 18
Plan Fractions Treated to Date: 13
Plan Prescribed Dose Per Fraction: 1.8 Gy
Plan Total Fractions Prescribed: 30
Plan Total Prescribed Dose: 54 Gy
Reference Point Dosage Given to Date: 23.4 Gy
Reference Point Session Dosage Given: 1.8 Gy
Session Number: 13

## 2024-08-19 LAB — CMP (CANCER CENTER ONLY)
ALT: 32 U/L (ref 0–44)
AST: 33 U/L (ref 15–41)
Albumin: 3.5 g/dL (ref 3.5–5.0)
Alkaline Phosphatase: 93 U/L (ref 38–126)
Anion gap: 8 (ref 5–15)
BUN: 10 mg/dL (ref 6–20)
CO2: 26 mmol/L (ref 22–32)
Calcium: 8.7 mg/dL — ABNORMAL LOW (ref 8.9–10.3)
Chloride: 99 mmol/L (ref 98–111)
Creatinine: 0.89 mg/dL (ref 0.44–1.00)
GFR, Estimated: >60 mL/min (ref >60)
Glucose, Bld: 94 mg/dL (ref 70–99)
Potassium: 4.3 mmol/L (ref 3.5–5.1)
Sodium: 133 mmol/L — ABNORMAL LOW (ref 135–145)
Total Bilirubin: 0.9 mg/dL (ref 0.0–1.2)
Total Protein: 6.6 g/dL (ref 6.5–8.1)

## 2024-08-19 LAB — CBC WITH DIFFERENTIAL (CANCER CENTER ONLY)
Abs Immature Granulocytes: 0.04 K/uL (ref 0.00–0.07)
Basophils Absolute: 0 K/uL (ref 0.0–0.1)
Basophils Relative: 1 %
Eosinophils Absolute: 0.1 K/uL (ref 0.0–0.5)
Eosinophils Relative: 2 %
HCT: 36.9 % (ref 36.0–46.0)
Hemoglobin: 11.8 g/dL — ABNORMAL LOW (ref 12.0–15.0)
Immature Granulocytes: 2 %
Lymphocytes Relative: 22 %
Lymphs Abs: 0.6 K/uL — ABNORMAL LOW (ref 0.7–4.0)
MCH: 30.9 pg (ref 26.0–34.0)
MCHC: 32 g/dL (ref 30.0–36.0)
MCV: 96.6 fL (ref 80.0–100.0)
Monocytes Absolute: 0.6 K/uL (ref 0.1–1.0)
Monocytes Relative: 23 %
Neutro Abs: 1.3 K/uL — ABNORMAL LOW (ref 1.7–7.7)
Neutrophils Relative %: 50 %
Platelet Count: 228 K/uL (ref 150–400)
RBC: 3.82 MIL/uL — ABNORMAL LOW (ref 3.87–5.11)
RDW: 16.4 % — ABNORMAL HIGH (ref 11.5–15.5)
WBC Count: 2.5 K/uL — ABNORMAL LOW (ref 4.0–10.5)
nRBC: 0 % (ref 0.0–0.2)

## 2024-08-19 MED ORDER — SODIUM CHLORIDE 0.9 % IV SOLN
INTRAVENOUS | Status: DC
  Filled 2024-08-19 (×2): qty 250

## 2024-08-19 MED ORDER — DIPHENOXYLATE-ATROPINE 2.5-0.025 MG PO TABS
1.0000 | ORAL_TABLET | Freq: Four times a day (QID) | ORAL | 0 refills | Status: DC | PRN
Start: 2024-08-19 — End: 2024-08-22

## 2024-08-19 MED ORDER — DEXAMETHASONE SODIUM PHOSPHATE 10 MG/ML IJ SOLN
10.0000 mg | Freq: Once | INTRAMUSCULAR | Status: AC
  Administered 2024-08-19: 10 mg via INTRAVENOUS
  Filled 2024-08-19: qty 1

## 2024-08-19 NOTE — Assessment & Plan Note (Addendum)
 Etiology secondary to chemoradiation. She is currently having greater than 7 loose stools daily.  She is taking Imodium intermittently. She is trying to stay hydrated.  She receives fluids with us  quite frequently. Most recent labs from 08/19/2024 showed mild anemia, neutropenia which is to be expected with chemo and mild hyponatremia.  The rest of her electrolytes were WNL. We discussed going ahead with a liter of normal saline and will add in 10 mg dexamethasone  to help with nausea, energy and appetite. Discussed trying Lomotil to see if this helped some with the diarrhea.  Given often she does not know if she is having a bowel movement, would recommend at least taking it 4 times daily. She is scheduled to return to clinic on Thursday for labs and possible IV fluids.

## 2024-08-19 NOTE — Assessment & Plan Note (Signed)
 Etiology likely secondary to chemoradiation.  She was evaluated in [facility] ED for diarrhea and received IV fluids along with CT abdominal pelvis which showed more or less stable findings.

## 2024-08-19 NOTE — Progress Notes (Signed)
 [facility] OFFICE PROGRESS NOTE  Monica Gonzalez SQUIBB, MD  ASSESSMENT & PLAN:   Assessment & Plan Diarrhea, unspecified type Etiology secondary to chemoradiation. She is currently having greater than 7 loose stools daily.  She is taking Imodium intermittently. She is trying to stay hydrated.  She receives fluids with us  quite frequently. Most recent labs from 08/19/2024 showed mild anemia, neutropenia which is to be expected with chemo and mild hyponatremia.  The rest of her electrolytes were WNL. We discussed going ahead with a liter of normal saline and will add in 10 mg dexamethasone  to help with nausea, energy and appetite. Discussed trying Lomotil to see if this helped some with the diarrhea.  Given often she does not know if she is having a bowel movement, would recommend at least taking it 4 times daily. She is scheduled to return to clinic on Thursday for labs and possible IV fluids. Anal or rectal pain Continue tramadol  as needed for joint and anal pain. Recommend using Aquaphor and/or Desitin for her bottom pain secondary to frequent wiping. Discussed trying hydrocortisone 1% cream along with lidocaine  4%.  These are over-the-counter. Discussed case with Dr. Babara who would recommend starting here prior to adjusting her narcotics. Follow-up on Thursday. Nausea She is currently taking Zofran  8 mg.  Will give dexamethasone  today while in clinic.  Continue Zofran  for now. Port-A-Cath in place Having trouble getting blood return from port.  She has had issues the past few labs draws.  She also has developed a blood clot and is currently on Lovenox . Recommend dye study to assess for placement. Orders placed.  No orders of the defined types were placed in this encounter.   INTERVAL HISTORY: Patient returns for follow-up for diarrhea.  She was seen at [facility] ED on 08/17/2024 for diarrhea likely secondary to chemoradiation.  She has been having copious green-colored  diarrhea over the past 4 to 5 days.  While in the emergency room she received lactated Ringer's and also had a CT scan performed.  CT scan showed no acute findings with more or less stable no cancer findings.  Electrolytes were more or less stable with normal kidney function.  Urinalysis was unremarkable.  She is here today for follow-up and reassessment.  Reports she continues to have greater than 7 episodes of diarrhea daily despite taking Imodium 4-5 times daily.  Reports occasional stool incontinence.  Reports she is also having some nausea without vomiting. Taking Zofran  with some relief.  Reports having severe anal pain secondary to frequent wiping and diarrhea.  She has tried Aquaphor and Desitin with some relief.  Reports taking tramadol  for joint pain which is not helping and asking for something stronger for her anal pain as well.  We reviewed CBC, CMP.  SUMMARY OF HEMATOLOGIC HISTORY: Oncology History  Anal squamous cell carcinoma (HCC)  05/29/2024 Initial Diagnosis   Anal squamous cell carcinoma   Patient has noticed a hard mass at the anal area which she initially thought was a hemorrhoid.  The mass has been present for more than a month and appears to be enlarging. Patient was referred to establish care with surgeon Dr. Tye  05/16/2024, incisional biopsy of anal mass showed 1. Anus, biopsy, mass :       - INVASIVE MODERATELY DIFFERENTIATED SQUAMOUS CELL CARCINOMA  06/19/2024, left inguinal adenopathy biopsy showed Metastatic moderately differentiated squamous cell carcinoma.  '    05/29/2024 Cancer Staging   Staging form: Anus, AJCC V9 -  Clinical stage from 05/29/2024: Stage IIIA (cT3, cN1a, cM0) - Signed by Babara Call, MD on 06/26/2024 Stage prefix: Initial diagnosis   06/05/2024 Imaging   CT chest abdomen pelvis with contrast  Slight thickening in the area of the anal canal. Please correlate for history of neoplasm. There is a pathologic enlarged left inguinal node. There is a  borderline right-sided node.   No additional areas of abnormal nodal enlargement or other aggressive appearing mass lesion at this time.   Fatty liver infiltration with known segment 7 hepatic hemangioma.   Of note there is a right-sided upper breast mass which was not clearly seen on prior CT scan. Please correlate for any prior study or if needed diagnostic mammographic evaluation and possible ultrasound when appropriate.     06/12/2024 Imaging   CT chest abdomen pelvis with contrast showed Slight thickening in the area of the anal canal. Please correlate for history of neoplasm. There is a pathologic enlarged left inguinal node. There is a borderline right-sided node.   No additional areas of abnormal nodal enlargement or other aggressive appearing mass lesion at this time.   Fatty liver infiltration with known segment 7 hepatic hemangioma.   Of note there is a right-sided upper breast mass which was not clearly seen on prior CT scan. Please correlate for any prior study or if needed diagnostic mammographic evaluation and possible ultrasound when appropriate.   06/19/2024 Procedure   1. Lymph node, biopsy, left inguinal adenopathy :       -  METASTATIC MODERATELY DIFFERENTIATED SQUAMOUS CELL CARCINOMA    07/01/2024 Imaging   PET scan showed 1. Hypermetabolic anal mass with hypermetabolic left inguinal adenopathy, compatible with anorectal carcinoma. 2. Hypermetabolic right breast nodule with recent biopsies on 06/18/2024. Please correlate with pathology report.   07/31/2024 -  Chemotherapy   Patient is on Treatment Plan : ANUS Mitomycin  D1,28 + 5FU D1-4, 28-31 q32d     Invasive carcinoma of breast (HCC)  06/12/2024 Imaging   CT chest abdomen pelvis with contrast showed Slight thickening in the area of the anal canal. Please correlate for history of neoplasm. There is a pathologic enlarged left inguinal node. There is a borderline right-sided node.   No additional areas  of abnormal nodal enlargement or other aggressive appearing mass lesion at this time.   Fatty liver infiltration with known segment 7 hepatic hemangioma.   Of note there is a right-sided upper breast mass which was not clearly seen on prior CT scan. Please correlate for any prior study or if needed diagnostic mammographic evaluation and possible ultrasound when appropriate.   06/17/2024 Mammogram   1. There is a suspicious 17 mm mass at the site of CT and mammographic concern in the RIGHT breast. It demonstrates associated pleomorphic calcifications. Recommend ultrasound-guided biopsy for definitive characterization. 2. There is incidental sonographic note of a 7 mm mass in the RIGHT breast at 12 o'clock and a 6 mm non mass area at 9 o'clock. Recommend ultrasound-guided biopsy of these 2 areas for definitive characterization given suspicious appearance of the dominant mass. 3. No suspicious RIGHT axillary adenopathy. 4. No mammographic evidence of malignancy in the LEFT breast.   06/18/2024 Cancer Staging   Staging form: Breast, AJCC 8th Edition - Clinical stage from 06/18/2024: Stage IB (cT1b, cN0, cM0, G3, ER-, PR-, HER2-) - Signed by Babara Call, MD on 06/26/2024 Stage prefix: Initial diagnosis Histologic grading system: 3 grade system   06/18/2024 Initial Diagnosis   Invasive carcinoma of breast (HCC)  Patient with right breast biopsy.  Pathology showed 1. Breast, right, needle core biopsy, 12:00 12cmfn (heart clip) :      INVASIVE DUCTAL CARCINOMA      TUBULE FORMATION: SCORE 3      NUCLEAR PLEOMORPHISM: SCORE 3      MITOTIC COUNT: SCORE 2      TOTAL SCORE: 8      OVERALL GRADE: 3      LYMPHOVASCULAR INVASION: NOT IDENTIFIED      CANCER LENGTH: 1.0 CM      CALCIFICATIONS: NOT IDENTIFIED      OTHER FINDINGS: NONE      ER negative, PR negative, HER2 negative (IHC 0) Ki-67 95%.       2. Breast, right, needle core biopsy, 12:00 8cmfn (coil clip) :      INVASIVE DUCTAL CARCINOMA       DUCTAL CARCINOMA IN SITU, SOLID, HIGH NUCLEAR GRADE WITH NECROSIS      TUBULE FORMATION: SCORE 3      NUCLEAR PLEOMORPHISM: SCORE 3      MITOTIC COUNT: SCORE 2      TOTAL SCORE: 8      OVERALL GRADE: 3      LYMPHOVASCULAR INVASION: NOT IDENTIFIED      CANCER LENGTH: 0.3 CM      CALCIFICATIONS: NOT IDENTIFIED      OTHER FINDINGS: NONE            3. Breast, right, needle core biopsy, 9:00 12cmfn (venus) :      BENIGN BREAST TISSUE WITH DENSE STROMAL FIBROSIS.      NEGATIVE FOR ATYPIA OR MALIGNANCY.    07/08/2024 Surgery   Patient went right breast lumpectomy and sentinel lymph node biopsy.  1. 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      - DUCTAL CARCINOMA IN SITU (DCIS).      - SEE CANCER SUMMARY AND NOTE BELOW.      - TWO BIOPSY SITES WITH CORRESPONDING HEART AND COIL CLIPS.      - TWO SAVI SCOUT TAGS.       2. Lymph node, sentinel, biopsy, Right :      - MICROMETASTATIC CARCINOMA INVOLVES ONE OF FIVE LYMPH NODES (1MI/5).      - SEE CANCER SUMMARY AND NOTE BELOW.       3. Breast, excision, Right medial posterior margin :      - BENIGN BREAST TISSUE.      - NEGATIVE FOR ATYPIA AND MALIGNANCY.      - SEE CANCER SUMMARY BELOW.   TUMOR Histologic Type: Invasive carcinoma of no special type (ductal) Histologic Grade (Nottingham Histologic Score) Glandular (Acinar)/Tubular Differentiation: 3 Nuclear Pleomorphism: 3 Mitotic Rate: 3 Overall Grade: 3 Tumor Size: Greatest dimension of largest invasive focus: 19 mm Ductal Carcinoma In Situ (DCIS): Present, high-grade with comedonecrosis Lymphatic and/or Vascular Invasion: Present, extensive (LVI in 2 or more blocks)  Treatment Effect in the Breast: No known presurgical therapy MARGINS Margin Status for Invasive Carcinoma: Invasive Carcinoma involves inferior (unifocal) and superior (unifocal) margins and is 0.5 mm to anterior margin Margin Status for DCIS: All  margins negative for DCIS Distance from DCIS to closest margin: 0.5 mm Specify closest margin: Inferior REGIONAL LYMPH NODES Regional Lymph Node Status: Tumor present in regional lymph node(s) Number of Lymph Nodes with Macrometastases (greater than 2 mm): 0 Number of Lymph Nodes with Micrometastases (greater than 0.2 mm  to 2 mm and/or greater than 200 cells): 1 Number of Lymph Nodes with Isolated Tumor Cells (0.2 mm or less OR 200 cells or less): 0 Size of Largest Metastatic Deposit: 1.9 mm Extranodal Extension: Not identified Total Number of Lymph Nodes Examined (sentinel and non-sentinel): 5 Number of Sentinel Nodes Examined: 5 DISTANT METASTASIS Distant Site(s) Involved, if applicable: Not applicable PATHOLOGIC STAGE CLASSIFICATION (pTNM, AJCC 8th Edition): Modified Classification: Not applicable pT Category: pT1c T Suffix: (m) multiple primary synchronous tumors in a single organ pN Category: pN9mi N Suffix: (sn) pM Category: Not applicable SPECIAL STUDIES Breast Biomarker Testing Performed on Previous Biopsy: [id number] (heart) Estrogen Receptor: Negative (0%) Progesterone Receptor: Negative (0%) HER2 IHC: Negative (0) Proliferation Marker Ki67: 95%   07/22/2024 Cancer Staging   Staging form: Breast, AJCC 8th Edition - Pathologic stage from 07/22/2024: Stage IB (pT1c, pN27mi(sn), cM0, G3, ER-, PR-, HER2-) - Signed by Babara Call, MD on 07/22/2024 Stage prefix: Initial diagnosis Method of lymph node assessment: Sentinel lymph node biopsy Multigene prognostic tests performed: None Histologic grading system: 3 grade system   07/25/2024 Surgery   Patient reports additional excision for positive margin.  1. Breast, excision, superior margin of right breast - RESIDUAL INVASIVE DUCTAL CARCINOMA, COMPLETELY EXCISED. - CARCINOMA PRESENT 2 MM FROM THE POSTERIOR MARGIN AND 4 MM FROM THE ANTERIOR AND SUPERIOR MARGINS. 2. Breast, excision, inferior margin of right breast - RESIDUAL  INVASIVE DUCTAL CARCINOMA, COMPLETELY EXCISED. - CARCINOMA PRESENT 1 MM FROM THE NEW INFERIOR MARGIN.      CBC    Component Value Date/Time   WBC 2.5 (L) 08/19/2024 1228   WBC 2.9 (L) 08/17/2024 2134   RBC 3.82 (L) 08/19/2024 1228   HGB 11.8 (L) 08/19/2024 1228   HGB 14.1 03/06/2015 1307   HCT 36.9 08/19/2024 1228   HCT 42.4 03/06/2015 1307   PLT 228 08/19/2024 1228   PLT 252 03/06/2015 1307   MCV 96.6 08/19/2024 1228   MCV 90 03/06/2015 1307   MCH 30.9 08/19/2024 1228   MCHC 32.0 08/19/2024 1228   RDW 16.4 (H) 08/19/2024 1228   RDW 14.1 03/06/2015 1307   LYMPHSABS 0.6 (L) 08/19/2024 1228   LYMPHSABS 0.5 (L) 03/06/2015 1307   MONOABS 0.6 08/19/2024 1228   MONOABS 0.4 03/06/2015 1307   EOSABS 0.1 08/19/2024 1228   EOSABS 0.0 03/06/2015 1307   BASOSABS 0.0 08/19/2024 1228   BASOSABS 0.1 03/06/2015 1307       Latest Ref Rng & Units 08/19/2024   12:28 PM 08/17/2024    9:34 PM 08/15/2024    2:18 PM  CMP  Glucose 70 - 99 mg/dL 94  872  896   BUN 6 - 20 mg/dL 10  5  7    Creatinine 0.44 - 1.00 mg/dL 9.10  8.87  8.87   Sodium 135 - 145 mmol/L 133  138  136   Potassium 3.5 - 5.1 mmol/L 4.3  3.7  3.7   Chloride 98 - 111 mmol/L 99  104  104   CO2 22 - 32 mmol/L 26  24  26    Calcium 8.9 - 10.3 mg/dL 8.7  8.7  8.6   Total Protein 6.5 - 8.1 g/dL 6.6  6.5  6.3   Total Bilirubin 0.0 - 1.2 mg/dL 0.9  0.6  0.6   Alkaline Phos 38 - 126 U/L 93  78  76   AST 15 - 41 U/L 33  26  24   ALT 0 - 44 U/L 32  24  24      Lab Results  Component Value Date   VITAMINB12 227 05/24/2023    Vitals:   08/19/24 1325  BP: (!) 106/90  Pulse: 87  Resp: 18  Temp: 98.6 F (37 C)    Review of System:  Review of Systems  Constitutional:  Positive for malaise/fatigue.  Gastrointestinal:  Positive for diarrhea, nausea and vomiting. Negative for abdominal pain, blood in stool and constipation.  Skin:        Skin irritation at anus secondary to radiation.  Neurological:  Positive for dizziness  and weakness.    Physical Exam: Physical Exam Constitutional:      Appearance: Normal appearance.  HENT:     Head: Normocephalic and atraumatic.  Eyes:     Pupils: Pupils are equal, round, and reactive to light.  Cardiovascular:     Rate and Rhythm: Normal rate and regular rhythm.     Heart sounds: Normal heart sounds. No murmur heard. Pulmonary:     Effort: Pulmonary effort is normal.     Breath sounds: Normal breath sounds. No wheezing.  Abdominal:     General: Bowel sounds are normal. There is no distension.     Palpations: Abdomen is soft.     Tenderness: There is no abdominal tenderness.  Musculoskeletal:        General: Normal range of motion.     Cervical back: Normal range of motion.  Skin:    General: Skin is warm and dry.     Findings: No rash.  Neurological:     Mental Status: She is alert and oriented to person, place, and time.     Gait: Gait is intact.  Psychiatric:        Mood and Affect: Mood and affect normal.        Cognition and Memory: Memory normal.        Judgment: Judgment normal.      I spent 35 minutes dedicated to the care of this patient (face-to-face and non-face-to-face) on the date of the encounter to include what is described in the assessment and plan.,  Delon Hope, NP 08/19/2024 2:18 PM

## 2024-08-20 ENCOUNTER — Encounter: Payer: Self-pay | Admitting: *Deleted

## 2024-08-20 ENCOUNTER — Other Ambulatory Visit: Payer: Self-pay | Admitting: *Deleted

## 2024-08-20 ENCOUNTER — Ambulatory Visit
Admission: RE | Admit: 2024-08-20 | Discharge: 2024-08-20 | Disposition: A | Source: Ambulatory Visit | Attending: Radiation Oncology | Admitting: Radiation Oncology

## 2024-08-20 ENCOUNTER — Other Ambulatory Visit: Payer: Self-pay

## 2024-08-20 DIAGNOSIS — R110 Nausea: Secondary | ICD-10-CM

## 2024-08-20 DIAGNOSIS — C210 Malignant neoplasm of anus, unspecified: Secondary | ICD-10-CM | POA: Diagnosis not present

## 2024-08-20 DIAGNOSIS — R197 Diarrhea, unspecified: Secondary | ICD-10-CM

## 2024-08-20 LAB — RAD ONC ARIA SESSION SUMMARY
Course Elapsed Days: 19
Plan Fractions Treated to Date: 14
Plan Prescribed Dose Per Fraction: 1.8 Gy
Plan Total Fractions Prescribed: 30
Plan Total Prescribed Dose: 54 Gy
Reference Point Dosage Given to Date: 25.2 Gy
Reference Point Session Dosage Given: 1.8 Gy
Session Number: 14

## 2024-08-21 ENCOUNTER — Other Ambulatory Visit: Payer: Self-pay | Admitting: Oncology

## 2024-08-21 ENCOUNTER — Ambulatory Visit
Admission: RE | Admit: 2024-08-21 | Discharge: 2024-08-21 | Disposition: A | Discharge status: Home / Self Care | Source: Ambulatory Visit | Attending: Oncology | Admitting: Oncology

## 2024-08-21 ENCOUNTER — Ambulatory Visit
Admission: RE | Admit: 2024-08-21 | Discharge: 2024-08-21 | Disposition: A | Source: Ambulatory Visit | Attending: Radiation Oncology | Admitting: Radiation Oncology

## 2024-08-21 ENCOUNTER — Other Ambulatory Visit: Payer: Self-pay

## 2024-08-21 ENCOUNTER — Encounter: Payer: Self-pay | Admitting: Oncology

## 2024-08-21 DIAGNOSIS — I82C11 Acute embolism and thrombosis of right internal jugular vein: Secondary | ICD-10-CM | POA: Diagnosis not present

## 2024-08-21 DIAGNOSIS — Z452 Encounter for adjustment and management of vascular access device: Secondary | ICD-10-CM

## 2024-08-21 DIAGNOSIS — C210 Malignant neoplasm of anus, unspecified: Secondary | ICD-10-CM | POA: Diagnosis not present

## 2024-08-21 HISTORY — PX: IR CV LINE INJECTION: IMG2294

## 2024-08-21 LAB — RAD ONC ARIA SESSION SUMMARY
Course Elapsed Days: 20
Plan Fractions Treated to Date: 15
Plan Prescribed Dose Per Fraction: 1.8 Gy
Plan Total Fractions Prescribed: 30
Plan Total Prescribed Dose: 54 Gy
Reference Point Dosage Given to Date: 27 Gy
Reference Point Session Dosage Given: 1.8 Gy
Session Number: 15

## 2024-08-21 MED ORDER — IOHEXOL 300 MG/ML  SOLN
10.0000 mL | Freq: Once | INTRAMUSCULAR | Status: AC | PRN
  Administered 2024-08-21: 10 mL via INTRAVENOUS

## 2024-08-21 NOTE — Progress Notes (Signed)
 RE: Port-A-Cath dysfunction  Patient has been having issues with her port for the last month or so.  Patient had dye study completed today and unfortunately showed malpositioned right IJ with significant fibrin sheath completely occluding the catheter tip.  Occlusive thrombus is present with the right intrajugular vein.  Discussed with Dr. Karalee who recommended immediate removal of her Port-A-Cath and placement of a PICC line so that her treatment is not interrupted.  Recommends Doppler of right upper extremity in 4 to 6 weeks to see if clot has dissolved and if she is a candidate for replacement of her port.  She is currently anticoagulated with Eliquis.  He feels like dysfunction of her catheter is likely multifactorial and possibly due to body habitus.  Will place orders for port removal.  Delon Hope, NP 08/21/2024 3:39 PM

## 2024-08-22 ENCOUNTER — Telehealth: Payer: Self-pay | Admitting: Pharmacy Technician

## 2024-08-22 ENCOUNTER — Ambulatory Visit
Admission: RE | Admit: 2024-08-22 | Discharge: 2024-08-22 | Disposition: A | Source: Ambulatory Visit | Attending: Radiation Oncology | Admitting: Radiation Oncology

## 2024-08-22 ENCOUNTER — Other Ambulatory Visit (HOSPITAL_COMMUNITY): Payer: Self-pay

## 2024-08-22 ENCOUNTER — Telehealth: Payer: Self-pay

## 2024-08-22 ENCOUNTER — Other Ambulatory Visit: Payer: Self-pay

## 2024-08-22 ENCOUNTER — Inpatient Hospital Stay: Admitting: Nurse Practitioner

## 2024-08-22 ENCOUNTER — Inpatient Hospital Stay

## 2024-08-22 ENCOUNTER — Other Ambulatory Visit: Payer: Self-pay | Admitting: Oncology

## 2024-08-22 ENCOUNTER — Telehealth: Payer: Self-pay | Admitting: Pharmacist

## 2024-08-22 ENCOUNTER — Encounter: Payer: Self-pay | Admitting: Oncology

## 2024-08-22 ENCOUNTER — Encounter: Payer: Self-pay | Admitting: Nurse Practitioner

## 2024-08-22 VITALS — BP 108/57 | HR 82

## 2024-08-22 VITALS — BP 88/64 | HR 93 | Resp 19 | Wt 194.6 lb

## 2024-08-22 DIAGNOSIS — T451X5A Adverse effect of antineoplastic and immunosuppressive drugs, initial encounter: Secondary | ICD-10-CM

## 2024-08-22 DIAGNOSIS — C210 Malignant neoplasm of anus, unspecified: Secondary | ICD-10-CM | POA: Diagnosis not present

## 2024-08-22 DIAGNOSIS — G893 Neoplasm related pain (acute) (chronic): Secondary | ICD-10-CM

## 2024-08-22 DIAGNOSIS — Z09 Encounter for follow-up examination after completed treatment for conditions other than malignant neoplasm: Secondary | ICD-10-CM

## 2024-08-22 DIAGNOSIS — R110 Nausea: Secondary | ICD-10-CM

## 2024-08-22 DIAGNOSIS — E860 Dehydration: Secondary | ICD-10-CM

## 2024-08-22 DIAGNOSIS — K521 Toxic gastroenteritis and colitis: Secondary | ICD-10-CM

## 2024-08-22 DIAGNOSIS — K1231 Oral mucositis (ulcerative) due to antineoplastic therapy: Secondary | ICD-10-CM

## 2024-08-22 DIAGNOSIS — Z452 Encounter for adjustment and management of vascular access device: Secondary | ICD-10-CM

## 2024-08-22 DIAGNOSIS — R197 Diarrhea, unspecified: Secondary | ICD-10-CM

## 2024-08-22 LAB — CBC WITH DIFFERENTIAL (CANCER CENTER ONLY)
Abs Immature Granulocytes: 0.19 K/uL — ABNORMAL HIGH (ref 0.00–0.07)
Basophils Absolute: 0 K/uL (ref 0.0–0.1)
Basophils Relative: 1 %
Eosinophils Absolute: 0 K/uL (ref 0.0–0.5)
Eosinophils Relative: 1 %
HCT: 37.3 % (ref 36.0–46.0)
Hemoglobin: 11.9 g/dL — ABNORMAL LOW (ref 12.0–15.0)
Immature Granulocytes: 5 %
Lymphocytes Relative: 16 %
Lymphs Abs: 0.6 K/uL — ABNORMAL LOW (ref 0.7–4.0)
MCH: 31.1 pg (ref 26.0–34.0)
MCHC: 31.9 g/dL (ref 30.0–36.0)
MCV: 97.4 fL (ref 80.0–100.0)
Monocytes Absolute: 0.6 K/uL (ref 0.1–1.0)
Monocytes Relative: 15 %
Neutro Abs: 2.5 K/uL (ref 1.7–7.7)
Neutrophils Relative %: 62 %
Platelet Count: 245 K/uL (ref 150–400)
RBC: 3.83 MIL/uL — ABNORMAL LOW (ref 3.87–5.11)
RDW: 17 % — ABNORMAL HIGH (ref 11.5–15.5)
WBC Count: 4 K/uL (ref 4.0–10.5)
nRBC: 0 % (ref 0.0–0.2)

## 2024-08-22 LAB — BASIC METABOLIC PANEL - CANCER CENTER ONLY
Anion gap: 10 (ref 5–15)
BUN: 8 mg/dL (ref 6–20)
CO2: 22 mmol/L (ref 22–32)
Calcium: 8.4 mg/dL — ABNORMAL LOW (ref 8.9–10.3)
Chloride: 104 mmol/L (ref 98–111)
Creatinine: 1.05 mg/dL — ABNORMAL HIGH (ref 0.44–1.00)
GFR, Estimated: >60 mL/min (ref >60)
Glucose, Bld: 89 mg/dL (ref 70–99)
Potassium: 3.1 mmol/L — ABNORMAL LOW (ref 3.5–5.1)
Sodium: 136 mmol/L (ref 135–145)

## 2024-08-22 LAB — RAD ONC ARIA SESSION SUMMARY
Course Elapsed Days: 21
Plan Fractions Treated to Date: 16
Plan Prescribed Dose Per Fraction: 1.8 Gy
Plan Total Fractions Prescribed: 30
Plan Total Prescribed Dose: 54 Gy
Reference Point Dosage Given to Date: 28.8 Gy
Reference Point Session Dosage Given: 1.8 Gy
Session Number: 16

## 2024-08-22 MED ORDER — SODIUM CHLORIDE 0.9 % IV SOLN
INTRAVENOUS | Status: DC
  Filled 2024-08-22 (×2): qty 250

## 2024-08-22 MED ORDER — OLANZAPINE 5 MG PO TABS
5.0000 mg | ORAL_TABLET | Freq: Every day | ORAL | 3 refills | Status: DC
  Filled 2024-08-22: qty 30, 30d supply, fill #0

## 2024-08-22 MED ORDER — DIPHENOXYLATE-ATROPINE 2.5-0.025 MG PO TABS
1.0000 | ORAL_TABLET | Freq: Four times a day (QID) | ORAL | 0 refills | Status: DC | PRN
  Filled 2024-08-22: qty 30, 4d supply, fill #0

## 2024-08-22 MED ORDER — CAPECITABINE 500 MG PO TABS: 825.0000 mg/m2 | ORAL_TABLET | Freq: Two times a day (BID) | ORAL | 0 refills | Status: DC

## 2024-08-22 MED ORDER — HYDROCORTISONE (PERIANAL) 2.5 % EX CREA
1.0000 | TOPICAL_CREAM | Freq: Two times a day (BID) | CUTANEOUS | 2 refills | Status: DC
  Filled 2024-08-22: qty 90, 30d supply, fill #0

## 2024-08-22 MED ORDER — POTASSIUM CHLORIDE CRYS ER 20 MEQ PO TBCR
20.0000 meq | EXTENDED_RELEASE_TABLET | Freq: Every day | ORAL | 1 refills | Status: DC
  Filled 2024-08-22: qty 30, 30d supply, fill #0

## 2024-08-22 MED ORDER — CAPECITABINE 500 MG PO TABS: 825.0000 mg/m2 | ORAL_TABLET | Freq: Two times a day (BID) | ORAL | Status: DC

## 2024-08-22 NOTE — Telephone Encounter (Signed)
 Oral Oncology Patient Advocate Encounter  After completing a benefits investigation, prior authorization for Capecitabine is not required at this time through patient's commercial insurance ([employer]) or patient's medicaid.  Patient's copay is $4.    Due to commercial insurance patient must fill through [employer].   Shigeru Lampert (Patty) Chet Burnet, CPhT  [facility], Zelda Salmon, [facility] - Oral Chemotherapy Patient Advocate Specialist III Phone: [phone]  Fax: [phone]

## 2024-08-22 NOTE — Progress Notes (Signed)
 DISCONTINUE ON PATHWAY REGIMEN - Anal Carcinoma     One cycle, concurrent with RT:     Fluorouracil       Mitomycin    **Always confirm dose/schedule in your pharmacy ordering system**  PRIOR TREATMENT: ANLOS01: Fluorouracil  1,000 mg/m2/day CIV D1-4, 29-32 + Mitomycin  10 mg/m2 D1, 29 + Concurrent Radiation Therapy  START ON PATHWAY REGIMEN - Anal Carcinoma     One cycle, concurrent with RT:     Capecitabine      Mitomycin    **Always confirm dose/schedule in your pharmacy ordering system**  Patient Characteristics: Anal Canal Tumors, Newly Diagnosed - Locoregional Disease (Clinical Staging) Therapeutic Status: Newly Diagnosed - Locoregional Disease (Clinical Staging) AJCC T Category: cT3 AJCC N Category: cN1 AJCC M Category: cM0 AJCC 9 Stage Grouping: IIIA Check here if patient was staged using an edition other than AJCC Staging 9th Edition: false Intent of Therapy: Curative Intent, Discussed with Patient

## 2024-08-22 NOTE — Telephone Encounter (Signed)
 Patient scheduled for Port removal and PICC line placement on Wed 10/15 @ 3p, arrive 2:30p. It will be local anesthesia so she does not have to be NPO ,she can drive herself & Heart and vascular entrance. Pt aware of appt date/time.

## 2024-08-22 NOTE — Telephone Encounter (Signed)
 Received message from Delon Hope, NP: got a phone call from Dr. Karalee in IR saying that her port needs to be removed ASAP. She then needs to have a PICC placed so there is no interruption in her treatment. It is completely occluded with a thrombus. He then recommends keeping the PICC for at least 4 to 6 weeks and then doing a Doppler of her right upper extremity to see if she has any additional clots if not then she can get a port placed again.  Per Randall NOVAK, Dr. Karalee spoke to pt directly and notiied her of port removal.   Per Dr. Babara team, please arrange patient to get medi port removal and picc line placement. also arrange her to get lab anti Xa level around 11:45AM tmr (10/9). I will talk to oral pharmacy team to work on Xeloda approval. I called pt and she is aware about the plan.  Patient has been scheduled for labs at 11:45am. Checking with Dr. Tye and IR about port removal and PICC line placement.

## 2024-08-22 NOTE — Progress Notes (Signed)
 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  The mass has been present for more than a month and appears to be enlarging. Patient was referred to establish care with surgeon Dr. Tye  05/16/2024, incisional biopsy of anal mass showed 1. Anus, biopsy, mass :       - INVASIVE MODERATELY DIFFERENTIATED SQUAMOUS CELL CARCINOMA  06/19/2024, left inguinal adenopathy biopsy showed Metastatic moderately differentiated squamous cell carcinoma.  '    05/29/2024 Cancer Staging   Staging form: Anus, AJCC V9 - Clinical stage from 05/29/2024: Stage IIIA (cT3, cN1a, cM0) - Signed by Babara Call, MD on 06/26/2024 Stage prefix: Initial diagnosis   06/05/2024 Imaging   CT chest abdomen pelvis with contrast  Slight thickening in the area of the anal canal. Please correlate for history of neoplasm. There is a pathologic enlarged left inguinal node. There is a borderline right-sided node.   No additional areas of abnormal nodal enlargement or other aggressive appearing mass lesion at this time.   Fatty liver infiltration with known segment 7 hepatic hemangioma.   Of note there is a right-sided upper breast mass which was not clearly seen on prior CT scan. Please correlate for any prior study or if needed diagnostic mammographic evaluation and possible ultrasound when appropriate.     06/12/2024 Imaging   CT chest abdomen pelvis with contrast  showed Slight thickening in the area of the anal canal. Please correlate for history of neoplasm. There is a pathologic enlarged left inguinal node. There is a borderline right-sided node.   No additional areas of abnormal nodal enlargement or other aggressive appearing mass lesion at this time.   Fatty liver infiltration with known segment 7 hepatic hemangioma.   Of note there is a right-sided upper breast mass which was not clearly seen on prior CT scan. Please correlate for any prior study or if needed diagnostic mammographic evaluation and possible ultrasound when appropriate.   06/19/2024 Procedure   1. Lymph node, biopsy, left inguinal adenopathy :       -  METASTATIC MODERATELY DIFFERENTIATED SQUAMOUS CELL CARCINOMA    07/01/2024 Imaging   PET scan showed 1. Hypermetabolic anal mass with hypermetabolic left inguinal adenopathy, compatible with anorectal carcinoma. 2. Hypermetabolic right breast nodule with recent biopsies on 06/18/2024. Please correlate with pathology report.   07/31/2024 -  Chemotherapy   Patient is on Treatment Plan : ANUS Mitomycin  D1,28 + 5FU D1-4, 28-31 q32d     Invasive carcinoma of breast (HCC)  06/12/2024 Imaging   CT chest abdomen pelvis with contrast showed Slight thickening in the area of the anal canal. Please correlate for history of neoplasm. There is a pathologic enlarged left inguinal node. There is a borderline right-sided node.   No additional areas of abnormal nodal enlargement or other aggressive appearing mass lesion at this time.   Fatty liver infiltration with known segment 7 hepatic hemangioma.   Of note there is a right-sided upper breast mass which was not clearly seen  on prior CT scan. Please correlate for any prior study or if needed diagnostic mammographic evaluation and possible ultrasound when appropriate.   06/17/2024 Mammogram   1. There is a suspicious 17 mm mass at the site of CT and mammographic concern in the RIGHT  breast. It demonstrates associated pleomorphic calcifications. Recommend ultrasound-guided biopsy for definitive characterization. 2. There is incidental sonographic note of a 7 mm mass in the RIGHT breast at 12 o'clock and a 6 mm non mass area at 9 o'clock. Recommend ultrasound-guided biopsy of these 2 areas for definitive characterization given suspicious appearance of the dominant mass. 3. No suspicious RIGHT axillary adenopathy. 4. No mammographic evidence of malignancy in the LEFT breast.   06/18/2024 Cancer Staging   Staging form: Breast, AJCC 8th Edition - Clinical stage from 06/18/2024: Stage IB (cT1b, cN0, cM0, G3, ER-, PR-, HER2-) - Signed by Babara Call, MD on 06/26/2024 Stage prefix: Initial diagnosis Histologic grading system: 3 grade system   06/18/2024 Initial Diagnosis   Invasive carcinoma of breast [facility])  Patient with right breast biopsy.  Pathology showed 1. Breast, right, needle core biopsy, 12:00 12cmfn (heart clip) :      INVASIVE DUCTAL CARCINOMA      TUBULE FORMATION: SCORE 3      NUCLEAR PLEOMORPHISM: SCORE 3      MITOTIC COUNT: SCORE 2      TOTAL SCORE: 8      OVERALL GRADE: 3      LYMPHOVASCULAR INVASION: NOT IDENTIFIED      CANCER LENGTH: 1.0 CM      CALCIFICATIONS: NOT IDENTIFIED      OTHER FINDINGS: NONE      ER negative, PR negative, HER2 negative (IHC 0) Ki-67 95%.       2. Breast, right, needle core biopsy, 12:00 8cmfn (coil clip) :      INVASIVE DUCTAL CARCINOMA      DUCTAL CARCINOMA IN SITU, SOLID, HIGH NUCLEAR GRADE WITH NECROSIS      TUBULE FORMATION: SCORE 3      NUCLEAR PLEOMORPHISM: SCORE 3      MITOTIC COUNT: SCORE 2      TOTAL SCORE: 8      OVERALL GRADE: 3      LYMPHOVASCULAR INVASION: NOT IDENTIFIED      CANCER LENGTH: 0.3 CM      CALCIFICATIONS: NOT IDENTIFIED      OTHER FINDINGS: NONE            3. Breast, right, needle core biopsy, 9:00 12cmfn (venus) :      BENIGN BREAST TISSUE WITH DENSE STROMAL FIBROSIS.      NEGATIVE FOR ATYPIA  OR MALIGNANCY.    07/08/2024 Surgery   Patient went right breast lumpectomy and sentinel lymph node biopsy.  1. 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      - DUCTAL CARCINOMA IN SITU (DCIS).      - SEE CANCER SUMMARY AND NOTE BELOW.      - TWO BIOPSY SITES WITH CORRESPONDING HEART AND COIL CLIPS.      - TWO SAVI SCOUT TAGS.       2. Lymph node, sentinel, biopsy, Right :      - MICROMETASTATIC CARCINOMA INVOLVES ONE OF FIVE LYMPH NODES (1MI/5).      - SEE CANCER SUMMARY AND NOTE BELOW.       3. Breast, excision, Right medial posterior margin :      -  BENIGN BREAST TISSUE.      - NEGATIVE FOR ATYPIA AND MALIGNANCY.      - SEE CANCER SUMMARY BELOW.   TUMOR Histologic Type: Invasive carcinoma of no special type (ductal) Histologic Grade (Nottingham Histologic Score) Glandular (Acinar)/Tubular Differentiation: 3 Nuclear Pleomorphism: 3 Mitotic Rate: 3 Overall Grade: 3 Tumor Size: Greatest dimension of largest invasive focus: 19 mm Ductal Carcinoma In Situ (DCIS): Present, high-grade with comedonecrosis Lymphatic and/or Vascular Invasion: Present, extensive (LVI in 2 or more blocks)  Treatment Effect in the Breast: No known presurgical therapy MARGINS Margin Status for Invasive Carcinoma: Invasive Carcinoma involves inferior (unifocal) and superior (unifocal) margins and is 0.5 mm to anterior margin Margin Status for DCIS: All margins negative for DCIS Distance from DCIS to closest margin: 0.5 mm Specify closest margin: Inferior REGIONAL LYMPH NODES Regional Lymph Node Status: Tumor present in regional lymph node(s) Number of Lymph Nodes with Macrometastases (greater than 2 mm): 0 Number of Lymph Nodes with Micrometastases (greater than 0.2 mm to 2 mm and/or greater than 200 cells): 1 Number of Lymph Nodes with Isolated Tumor Cells (0.2 mm or less OR 200 cells or less): 0 Size of Largest Metastatic Deposit: 1.9  mm Extranodal Extension: Not identified Total Number of Lymph Nodes Examined (sentinel and non-sentinel): 5 Number of Sentinel Nodes Examined: 5 DISTANT METASTASIS Distant Site(s) Involved, if applicable: Not applicable PATHOLOGIC STAGE CLASSIFICATION (pTNM, AJCC 8th Edition): Modified Classification: Not applicable pT Category: pT1c T Suffix: (m) multiple primary synchronous tumors in a single organ pN Category: pN56mi N Suffix: (sn) pM Category: Not applicable SPECIAL STUDIES Breast Biomarker Testing Performed on Previous Biopsy: [id number] (heart) Estrogen Receptor: Negative (0%) Progesterone Receptor: Negative (0%) HER2 IHC: Negative (0) Proliferation Marker Ki67: 95%   07/22/2024 Cancer Staging   Staging form: Breast, AJCC 8th Edition - Pathologic stage from 07/22/2024: Stage IB (pT1c, pN29mi(sn), cM0, G3, ER-, PR-, HER2-) - Signed by Babara Call, MD on 07/22/2024 Stage prefix: Initial diagnosis Method of lymph node assessment: Sentinel lymph node biopsy Multigene prognostic tests performed: None Histologic grading system: 3 grade system   07/25/2024 Surgery   Patient reports additional excision for positive margin.  1. Breast, excision, superior margin of right breast - RESIDUAL INVASIVE DUCTAL CARCINOMA, COMPLETELY EXCISED. - CARCINOMA PRESENT 2 MM FROM THE POSTERIOR MARGIN AND 4 MM FROM THE ANTERIOR AND SUPERIOR MARGINS. 2. Breast, excision, inferior margin of right breast - RESIDUAL INVASIVE DUCTAL CARCINOMA, COMPLETELY EXCISED. - CARCINOMA PRESENT 1 MM FROM THE NEW INFERIOR MARGIN.    Patient has a history of VIN 3 status post vulvectomy  07/11/2024- she was evaluated by cardiology with palpitations and atypical chest pain. Chest pain deemed atypical and she went on to have a normal echo at 07/19/2024.   07/23/2024, patient was seen by symptom management for right neck pain.  She was seen by Tinnie Dawn.  Right upper extremity ultrasound was obtained which showed acute  occlusive DVT involving the right internal jugular and subclavian veins.  Patient was started on Lovenox , she was supposed to be started on 1 mg/kg twice daily however was started off 40 mg twice daily which is a subtherapeutic dosage. She was asked to hold off the evening dose prior to her breast re-excision.  07/29/2024, patient presented emergency room due to shortness of breath and chest pain.  CTA angiogram showed subsegmental PE medially in the left lower lobe.  Shortness of breath was felt to be multifactorial secondary to asthma/bronchitis, pulmonary embolism.  Symptoms are better after  supportive care and she was discharged on Lovenox  80 mg subcutaneously twice daily.  History of Presenting Illness: Monica Gonzalez is a 48 y.o. female with above history of anal cancer and breast cancer who presents to clinic for complaints of diarrhea, rectal pain, nausea with vomiting. SHe feels poorly. No improvement with imodium . Antiemetics provide some relief but appetite is down and she has lost weight. She feels like she would benefit from IV fluids.     MEDICAL HISTORY:  Past Medical History:  Diagnosis Date   Anemia    Anginal pain    Anxiety    Arthritis    Asthma    Cancer (HCC)    Chronic pain syndrome 05/21/2023   DDD (degenerative disc disease), cervical 05/22/2023   Deep venous thrombosis (HCC)    Depression    Dyspnea    Fibromyalgia    GERD (gastroesophageal reflux disease)    Headache    Heart murmur    History of hiatal hernia    History of kidney stones    Lupus    Malignant neoplasm of upper-outer quadrant of right female breast, unspecified estrogen receptor status (HCC) 2025   Pneumonia    PTSD (post-traumatic stress disorder)    Skin candidiasis 05/29/2024   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    SURGICAL HISTORY: Past Surgical History:  Procedure Laterality Date   BREAST BIOPSY Right 06/18/2024   US  RT BREAST BX W LOC DEV 1ST LESION IMG BX  SPEC US  GUIDE 06/18/2024 [facility]   BREAST BIOPSY Right 06/18/2024   US  RT BREAST BX W LOC DEV EA ADD LESION IMG BX SPEC US  GUIDE 06/18/2024 [facility]   BREAST BIOPSY Right 06/18/2024   US  RT BREAST BX W LOC DEV EA ADD LESION IMG BX SPEC US  GUIDE 06/18/2024 [facility]   BREAST BIOPSY Right 07/05/2024   US  RT BREAST SAVI/RF TAG 1ST LESION US  GUIDE 07/05/2024 [facility]   BREAST BIOPSY Right 07/05/2024   US  RT BREAST SAVI/RF TAG EA ADD'L LESION US  GUIDE 07/05/2024 [facility]   BREAST LUMPECTOMY Right 07/25/2024   Procedure: BREAST LUMPECTOMY;  Surgeon: Tye Millet, DO;  Location: [facility];  Service: General;  Laterality: Right;   BREAST LUMPECTOMY WITH RADIO FREQUENCY LOCALIZER Right 07/08/2024   Procedure: BREAST LUMPECTOMY WITH RADIO FREQUENCY LOCALIZER;  Surgeon: Tye Millet, DO;  Location: [facility];  Service: General;  Laterality: Right;   COLONOSCOPY WITH PROPOFOL  N/A 02/17/2023   Procedure: COLONOSCOPY WITH PROPOFOL ;  Surgeon: Maryruth Ole DASEN, MD;  Location: [facility];  Service: Endoscopy;  Laterality: N/A;   ESOPHAGOGASTRODUODENOSCOPY (EGD) WITH PROPOFOL  N/A 02/17/2023   Procedure: ESOPHAGOGASTRODUODENOSCOPY (EGD) WITH PROPOFOL ;  Surgeon: Maryruth Ole DASEN, MD;  Location: [facility];  Service: Endoscopy;  Laterality: N/A;   EXTRACORPOREAL SHOCK WAVE LITHOTRIPSY     IR CV LINE INJECTION  08/21/2024   PORTACATH PLACEMENT Right 07/08/2024   Procedure: INSERTION, TUNNELED CENTRAL VENOUS DEVICE, WITH PORT;  Surgeon: Tye Millet, DO;  Location: [facility];  Service: General;  Laterality: Right;   RECTAL EXAM UNDER ANESTHESIA N/A 05/16/2024   Procedure: EXAM UNDER ANESTHESIA, RECTUM, INCISIONAL BIOPSY OF ANAL MASS;  Surgeon: Tye Millet, DO;  Location: [facility];  Service: General;  Laterality: N/A;   SENTINEL NODE BIOPSY Right 07/08/2024   Procedure: BIOPSY, LYMPH NODE, SENTINEL;  Surgeon: Tye Millet, DO;  Location: [facility];  Service: General;  Laterality:  Right;   TONSILLECTOMY     TUBAL LIGATION     VULVECTOMY N/A 07/29/2020  Procedure: WIDE EXCISION VULVECTOMY;  Surgeon: Mancil Barter, MD;  Location: [facility];  Service: Gynecology;  Laterality: N/A;    SOCIAL HISTORY: Social History   Socioeconomic History   Marital status: Divorced    Spouse name: Not on file   Number of children: 1   Years of education: Not on file   Highest education level: Not on file  Occupational History   Not on file  Tobacco Use   Smoking status: Never   Smokeless tobacco: Never  Vaping Use   Vaping status: Never Used  Substance and Sexual Activity   Alcohol use: Not Currently   Drug use: No   Sexual activity: Yes  Other Topics Concern   Not on file  Social History Narrative   Not on file   Social Drivers of Health   Financial Resource Strain: Medium Risk (05/15/2024)   Received from [facility]   Overall Financial Resource Strain (CARDIA)    Difficulty of Paying Living Expenses: Somewhat hard  Food Insecurity: No Food Insecurity (05/29/2024)   Hunger Vital Sign    Worried About Running Out of Food in the Last Year: Never true    Ran Out of Food in the Last Year: Never true  Transportation Needs: No Transportation Needs (05/29/2024)   PRAPARE - Administrator, Civil Service (Medical): No    Lack of Transportation (Non-Medical): No  Physical Activity: Not on file  Stress: Not on file  Social Connections: Not on file  Intimate Partner Violence: Not At Risk (05/29/2024)   Humiliation, Afraid, Rape, and Kick questionnaire    Fear of Current or Ex-Partner: No    Emotionally Abused: No    Physically Abused: No    Sexually Abused: No    FAMILY HISTORY: Family History  Problem Relation Age of Onset   Diabetes Mother    Congestive Heart Failure Mother    Colon cancer Father 53   Lupus Maternal Grandmother    Cancer Maternal Grandfather        either prostate or colon   Colon cancer Paternal Grandfather     Lung cancer Paternal Grandfather     ALLERGIES:  is allergic to prednisone and silicone.  MEDICATIONS:  Current Outpatient Medications  Medication Sig Dispense Refill   albuterol  (PROVENTIL ) (2.5 MG/3ML) 0.083% nebulizer solution Take 2.5 mg by nebulization every 6 (six) hours as needed for shortness of breath or wheezing.     albuterol  (VENTOLIN  HFA) 108 (90 Base) MCG/ACT inhaler Inhale 2 puffs into the lungs every 4 (four) hours as needed for wheezing or shortness of breath. 8.5 g 2   ascorbic acid (VITAMIN C) 500 MG tablet Take 500 mg by mouth daily.     B Complex-C (SUPER B COMPLEX PO) Take by mouth daily.     benzonatate  (TESSALON ) 200 MG capsule Take 1 capsule (200 mg total) by mouth 3 (three) times daily as needed for cough. 30 capsule 1   BIOTIN PO Take by mouth daily.     budesonide  (PULMICORT ) 0.25 MG/2ML nebulizer solution Take 2 mLs (0.25 mg total) by nebulization 2 (two) times daily. 60 mL 12   busPIRone  (BUSPAR ) 5 MG tablet Take 5 mg by mouth 2 (two) times daily.     cholecalciferol (VITAMIN D3) 25 MCG (1000 UNIT) tablet Take 1,000 Units by mouth daily.     clonazePAM  (KLONOPIN ) 0.5 MG tablet Take 0.5 mg by mouth at bedtime as needed (anxiety/sleep).     diphenoxylate -atropine  (LOMOTIL ) 2.5-0.025  MG tablet Take 1 tablet by mouth 4 (four) times daily as needed for diarrhea or loose stools. 30 tablet 0   DULoxetine  (CYMBALTA ) 30 MG capsule Take 30 mg by mouth at bedtime.     enoxaparin  (LOVENOX ) 80 MG/0.8ML injection Inject 0.8 mLs (80 mg total) into the skin every 12 (twelve) hours. 48 mL 1   esomeprazole  (NEXIUM ) 40 MG capsule Take 1 capsule (40 mg total) by mouth 2 (two) times daily before a meal. 60 capsule 2   gabapentin  (NEURONTIN ) 300 MG capsule Take 600 mg by mouth 2 (two) times daily.     guaiFENesin -codeine  100-10 MG/5ML syrup Take 5 mLs by mouth every 6 (six) hours as needed for cough. 120 mL 0   ipratropium (ATROVENT  HFA) 17 MCG/ACT inhaler Inhale 2 puffs into the  lungs every 6 (six) hours as needed for wheezing (COUGH). 12.9 g 1   ipratropium-albuterol  (DUONEB) 0.5-2.5 (3) MG/3ML SOLN Take 3 mLs by nebulization 4 (four) times daily. 360 mL 3   lidocaine  (LIDODERM ) 5 % 1 patch daily.     lidocaine  (XYLOCAINE ) 2 % solution Use as directed 15 mLs in the mouth or throat every 4 (four) hours as needed for mouth pain. 500 mL 0   lidocaine -prilocaine  (EMLA ) cream Apply to affected area once 30 g 3   linaclotide (LINZESS) 145 MCG CAPS capsule Take 145 mcg by mouth daily before breakfast.     loperamide  (IMODIUM  A-D) 2 MG tablet Take 2 mg by mouth 4 (four) times daily as needed for diarrhea or loose stools.     LORazepam  (ATIVAN ) 0.5 MG tablet Take 1 tablet (0.5 mg total) by mouth every 12 (twelve) hours as needed for anxiety (nausea). 30 tablet 0   magic mouthwash (multi-ingredient) oral suspension Swish/Spit 5-10 mLs by mouth 4 (four) times daily as needed for mouth pain. 480 mL 1   montelukast  (SINGULAIR ) 10 MG tablet Take 1 tablet (10 mg total) by mouth See admin instructions. Take 1 tablet on the day prior to chemotherapy and take 1 tablet daily for 2 days after chemotherapy. 30 tablet 0   Multiple Vitamin (MULTIVITAMIN WITH MINERALS) TABS tablet Take 1 tablet by mouth daily.     nystatin  (MYCOSTATIN ) 100000 UNIT/ML suspension Take 5 mLs (500,000 Units total) by mouth 4 (four) times daily. Swish and swallow 473 mL 1   nystatin  (MYCOSTATIN /NYSTOP ) powder Apply 1 Application topically 2 (two) times daily. 20 g 0   ondansetron  (ZOFRAN ) 8 MG tablet Take 1 tablet (8 mg total) by mouth every 8 (eight) hours as needed for nausea or vomiting. 30 tablet 1   potassium chloride  SA (KLOR-CON  M) 20 MEQ tablet Take 1 tablet (20 mEq total) by mouth daily. 10 tablet 0   prochlorperazine  (COMPAZINE ) 10 MG tablet Take 1 tablet (10 mg total) by mouth every 6 (six) hours as needed for nausea or vomiting. 30 tablet 1   promethazine -dextromethorphan  (PROMETHAZINE -DM) 6.25-15 MG/5ML  syrup Take 5 mLs by mouth 4 (four) times daily as needed for cough. 180 mL 0   sucralfate  (CARAFATE ) 1 g tablet Take 1 tablet (1 g total) by mouth 4 (four) times daily -  with meals and at bedtime. Dissolve in water  30 tablet 1   traMADol  (ULTRAM ) 50 MG tablet Take 1 tablet (50 mg total) by mouth every 8 (eight) hours as needed. 15 tablet 0   traZODone  (DESYREL ) 50 MG tablet Take 50 mg by mouth at bedtime.     ferrous sulfate  325 (65 FE) MG  tablet Take 325 mg by mouth daily.  (Patient not taking: Reported on 08/22/2024)     [Paused] naproxen  (EC NAPROSYN ) 500 MG EC tablet Take 1 tablet (500 mg total) by mouth 2 (two) times daily with a meal. 60 tablet 0   Turmeric (QC TUMERIC COMPLEX PO) Take by mouth daily. (Patient not taking: Reported on 08/22/2024)     No current facility-administered medications for this visit.    Review of Systems  Constitutional:  Positive for appetite change, fatigue and unexpected weight change. Negative for chills and fever.  HENT:   Negative for hearing loss, trouble swallowing and voice change.   Eyes:  Negative for eye problems.  Respiratory:  Negative for chest tightness, cough and shortness of breath.   Cardiovascular:  Negative for chest pain.  Gastrointestinal:  Positive for diarrhea, nausea, rectal pain and vomiting. Negative for abdominal distention, abdominal pain, blood in stool and constipation.       Anal mass with drainage  Endocrine: Negative for hot flashes.  Genitourinary:  Negative for difficulty urinating, dysuria, frequency, hematuria and vaginal bleeding.   Musculoskeletal:  Negative for arthralgias.  Skin:  Positive for wound (rectal). Negative for itching and rash.  Neurological:  Positive for light-headedness. Negative for dizziness.  Hematological:  Negative for adenopathy.  Psychiatric/Behavioral:  Negative for confusion and depression. The patient is not nervous/anxious.     PHYSICAL EXAMINATION: ECOG PERFORMANCE STATUS: 1 - Symptomatic  but completely ambulatory Vitals:   08/22/24 1429  BP: (!) 88/64  Pulse: 93  Resp: 19  SpO2: 98%   Filed Weights   08/22/24 1429  Weight: 194 lb 9.6 oz (88.3 kg)   Physical Exam Vitals reviewed.  Constitutional:      General: She is not in acute distress.    Appearance: She is not ill-appearing.     Comments: Fatigued appearing.   HENT:     Head: Normocephalic and atraumatic.     Mouth/Throat:     Mouth: Mucous membranes are dry.  Eyes:     General: No scleral icterus. Cardiovascular:     Rate and Rhythm: Normal rate and regular rhythm.     Heart sounds: No murmur heard. Pulmonary:     Effort: Pulmonary effort is normal. No respiratory distress.     Breath sounds: No wheezing.  Abdominal:     General: There is no distension.     Palpations: Abdomen is soft.     Tenderness: There is no abdominal tenderness.  Genitourinary:    Comments: Perirectal excoriation with ulceration and skin breakdown. Anal mass present with biopsy proven malignancy.  Musculoskeletal:     Comments: offloading  Skin:    General: Skin is warm and dry.     Findings: No erythema.  Neurological:     Mental Status: She is alert and oriented to person, place, and time.     Motor: No abnormal muscle tone.  Psychiatric:        Mood and Affect: Mood and affect normal.        Behavior: Behavior normal.     LABORATORY DATA:  I have reviewed the data as listed    Latest Ref Rng & Units 08/22/2024   10:52 AM 08/19/2024   12:28 PM 08/17/2024    9:34 PM  CBC  WBC 4.0 - 10.5 K/uL 4.0  2.5  2.9   Hemoglobin 12.0 - 15.0 g/dL 88.0  88.1  87.9   Hematocrit 36.0 - 46.0 % 37.3  36.9  37.9  Platelets 150 - 400 K/uL 245  228  226       Latest Ref Rng & Units 08/22/2024   10:52 AM 08/19/2024   12:28 PM 08/17/2024    9:34 PM  CMP  Glucose 70 - 99 mg/dL 89  94  872   BUN 6 - 20 mg/dL 8  10  5    Creatinine 0.44 - 1.00 mg/dL 8.94  9.10  8.87   Sodium 135 - 145 mmol/L 136  133  138   Potassium 3.5 - 5.1  mmol/L 3.1  4.3  3.7   Chloride 98 - 111 mmol/L 104  99  104   CO2 22 - 32 mmol/L 22  26  24    Calcium 8.9 - 10.3 mg/dL 8.4  8.7  8.7   Total Protein 6.5 - 8.1 g/dL  6.6  6.5   Total Bilirubin 0.0 - 1.2 mg/dL  0.9  0.6   Alkaline Phos 38 - 126 U/L  93  78   AST 15 - 41 U/L  33  26   ALT 0 - 44 U/L  32  24      RADIOGRAPHIC STUDIES: I have personally reviewed the radiological images as listed and agreed with the findings in the report. IR CV Line Injection Result Date: 08/21/2024 INDICATION: 48 year old female with a surgically placed right chest port catheter. Catheter is malfunctioning and the patient presents for port injection. EXAM: IR NON-TUNNELED CENTRAL VENOUS CATH PLC W IMG MEDICATIONS: None ANESTHESIA/SEDATION: None FLUOROSCOPY TIME:  Radiation exposure index: 10 mGy, air kerma COMPLICATIONS: None immediate. PROCEDURE: Informed written consent was obtained from the patient after a thorough discussion of the procedural risks, benefits and alternatives. All questions were addressed. Maximal Sterile Barrier Technique was utilized including caps, mask, sterile gowns, sterile gloves, sterile drape, hand hygiene and skin antiseptic. A timeout was performed prior to the initiation of the procedure. Fluoroscopic evaluation demonstrates a right IJ port catheter. The entry site into the internal jugular vein appears high and the external portion of the catheter tubing is redundant in the soft tissues of the right neck. Contrast was next injected. The tip of the catheter overlies the right innominate vein. No contrast enters the vein at this point, the contrast tracks back along the catheter where it fills retrograde into the internal jugular vein. A large tubular filling defect is present within the right internal jugular vein consistent with thrombus. No contrast material passes into the innominate vein. Contrast material instead fills the anterior jugular vein an other venous collaterals draining  into the left innominate vein. IMPRESSION: 1. Malpositioned right IJ approach port catheter with significant fibrin sheath completely occluding the catheter tip. 2. Occlusive thrombus is present within the right internal jugular vein. Recommend removal of this nonfunctional device and placement of a PICC to allow for continued venous access. After several weeks, right upper extremity duplex venous ultrasound could be performed to assess patency of the right internal jugular vein and, if patent, placement of a new port catheter could be considered at that time. Electronically Signed   By: Wilkie Lent M.D.   On: 08/21/2024 15:26   CT ABDOMEN PELVIS W CONTRAST Result Date: 08/17/2024 EXAM: CT ABDOMEN AND PELVIS WITH CONTRAST 08/17/2024 11:29:22 PM TECHNIQUE: CT of the abdomen and pelvis was performed with the administration of intravenous contrast, (iohexol  (OMNIPAQUE ) 300 MG/ML solution). Multiplanar reformatted images are provided for review. Automated exposure control, iterative reconstruction, and/or weight-based adjustment of the mA/kV was utilized to  reduce the radiation dose to as low as reasonably achievable. COMPARISON: Comparison made to June 05, 2024. CLINICAL HISTORY: N/V/D. Anal cancer. Patient presented to the ED from home via POV for loose stools that are green. Patient is nauseated with some abdominal pain. *tracking code: Bo* FINDINGS: LOWER CHEST: Visualized lung bases are clear. 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 The liver is otherwise unremarkable. GALLBLADDER AND BILE DUCTS: No mention of gallbladder or biliary ductal dilatation. SPLEEN: No acute abnormality. PANCREAS: No acute abnormality. ADRENAL GLANDS: No acute abnormality. KIDNEYS, URETERS AND BLADDER: No stones in the kidneys or ureters. No hydronephrosis. No perinephric or periureteral  stranding. Urinary bladder is unremarkable. GI AND BOWEL: Appendix is normal. The stomach, small bowel, and large bowel are otherwise unremarkable. PERITONEUM AND RETROPERITONEUM: Multiple inflammatory-appearing subcutaneous nodules are seen within the anterior abdominal wall, new since prior examination, likely related to subcutaneous injection. No ascites. No free air. VASCULATURE: Aorta is normal in caliber. LYMPH NODES: Heterogeneously enhancing enlarged left inguinal lymph node is again identified, measuring 2.4 cm in diameter, stable since prior examination. No additional pathologic adenopathy. REPRODUCTIVE ORGANS: No acute abnormality. BONES AND SOFT TISSUES: 3.3 x 5.0 cm complex fluid collection within the right breast, prior partial breast resection. No acute osseous abnormality. IMPRESSION: 1. No acute findings in the abdomen or pelvis. 2. 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 3. 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 No additional pathologic adenopathy or evidence of distal metastatic disease within the abdomen and pelvis. 4. Probable postsurgical changes within the right breast. Electronically signed by: Dorethia Molt MD 08/17/2024 11:43 PM EDT RP Workstation: [id number]   DG Chest 2 View Result Date: 08/13/2024 CLINICAL DATA:  Port-A-Cath placement. EXAM: CHEST - 2 VIEW COMPARISON:  July 29, 2024. FINDINGS: The heart size and mediastinal contours are within normal limits. Stable position of right internal jugular Port-A-Cath tip in expected position of upper SVC. Both lungs are clear. The visualized skeletal structures are unremarkable. IMPRESSION: No active cardiopulmonary disease. Electronically Signed   By: Lynwood Landy Raddle M.D.   On: 08/13/2024 15:26   DG  Shoulder Right Result Date: 08/08/2024 CLINICAL DATA:  right shoulder pain EXAM: RIGHT SHOULDER - 2+ VIEW COMPARISON:  None Available. FINDINGS: There is no evidence of fracture or dislocation. There is no evidence of arthropathy or aggressive appearing focal bone abnormality. Soft tissues are unremarkable. Accessed right chest wall catheter with tip overlying the expected region of the superior vena cava. IMPRESSION: No acute displaced fracture or dislocation. Electronically Signed   By: Morgane  Naveau M.D.   On: 08/08/2024 19:50   CT Angio Chest PE W and/or Wo Contrast Addendum Date: 07/29/2024 ADDENDUM REPORT: 07/29/2024 18:47 ADDENDUM: The original report was by Dr. Ryan Salvage. The following addendum is by Dr. Ryan Salvage: Critical Value/emergent results were called by telephone at the time of interpretation on 07/29/2024 at 6:47 pm to provider MARK QUALE , who verbally acknowledged these results. Electronically Signed   By: Ryan Salvage M.D.   On: 07/29/2024 18:47   Result Date: 07/29/2024 CLINICAL DATA:  Deep vein thrombosis, dyspnea, high probability of pulmonary embolus. Recent diagnosis of anal squamous cell carcinoma. * Tracking Code: BO * EXAM: CT  ANGIOGRAPHY CHEST WITH CONTRAST TECHNIQUE: Multidetector CT imaging of the chest was performed using the standard protocol during bolus administration of intravenous contrast. Multiplanar CT image reconstructions and MIPs were obtained to evaluate the vascular anatomy. RADIATION DOSE REDUCTION: This exam was performed according to the departmental dose-optimization program which includes automated exposure control, adjustment of the mA and/or kV according to patient size and/or use of iterative reconstruction technique. CONTRAST:  75mL OMNIPAQUE  IOHEXOL  350 MG/ML SOLN COMPARISON:  06/30/2024 FINDINGS: Cardiovascular: Right Port-A-Cath tip: Right internal jugular vein just above the brachiocephalic confluence. Surrounding hypodensity,  persistent thrombus in the right internal jugular vein not excluded. Acute subsegmental pulmonary embolus medially in the left lower lobe as on images 180-201 of series 6. No lobar or larger embolus identified. Mediastinum/Nodes: Unremarkable Lungs/Pleura: Mild subsegmental atelectasis in the right lower lobe. Upper Abdomen: Unremarkable Musculoskeletal: Unremarkable Review of the MIP images confirms the above findings. IMPRESSION: 1. Acute subsegmental pulmonary embolus medially in the left lower lobe. No lobar or larger embolus identified. 2. Right Port-A-Cath tip: Right internal jugular vein just above the brachiocephalic confluence. Surrounding hypodensity in the right internal jugular vein, persistent thrombus in the right internal jugular vein (as shown on ultrasound exam of 07/23/2024) not excluded. 3. Mild subsegmental atelectasis in the right lower lobe. Radiology assistant personnel have been notified to put me in telephone contact with the referring physician or the referring physician's clinical representative in order to discuss these findings. Once this communication is established I will issue an addendum to this report for documentation purposes. Electronically Signed: By: Ryan Salvage M.D. On: 07/29/2024 18:38   DG Chest 2 View Result Date: 07/29/2024 CLINICAL DATA:  Shortness of breath. EXAM: CHEST - 2 VIEW COMPARISON:  07/08/2024, CT 06/30/2024 FINDINGS: Stable positioning of right chest port with tip overlying the upper SVC. Normal heart size with stable mediastinal contours. No focal airspace disease, pleural effusion, pulmonary edema or pneumothorax. No acute osseous abnormalities are seen. IMPRESSION: No acute chest findings. Electronically Signed   By: Andrea Gasman M.D.   On: 07/29/2024 16:39   US  Venous Img Upper Uni Right Result Date: 07/23/2024 CLINICAL DATA:  Right neck pain at Port-A-Cath site EXAM: Right UPPER EXTREMITY VENOUS DOPPLER ULTRASOUND TECHNIQUE: Gray-scale  sonography with graded compression, as well as color Doppler and duplex ultrasound were performed to evaluate the upper extremity deep venous system from the level of the subclavian vein and including the jugular, axillary, basilic, radial, ulnar and upper cephalic vein. Spectral Doppler was utilized to evaluate flow at rest and with distal augmentation maneuvers. COMPARISON:  None Available. FINDINGS: Contralateral Subclavian Vein: Respiratory phasicity is normal and symmetric with the symptomatic side. No evidence of thrombus. Normal compressibility. Internal Jugular Vein: Positive for acute occlusive thrombus. Incompletely compressible Subclavian Vein: Positive for acute occlusive thrombus. Axillary Vein: No evidence of thrombus. Normal compressibility, respiratory phasicity and response to augmentation. Cephalic Vein: No evidence of thrombus. Normal compressibility, respiratory phasicity and response to augmentation. Basilic Vein: No evidence of thrombus. Normal compressibility, respiratory phasicity and response to augmentation. Brachial Veins: No evidence of thrombus. Normal compressibility, respiratory phasicity and response to augmentation. Radial Veins: No evidence of thrombus. Normal compressibility, respiratory phasicity and response to augmentation. Ulnar Veins: No evidence of thrombus. Normal compressibility, respiratory phasicity and response to augmentation. IMPRESSION: Positive for acute occlusive DVT involving the right internal jugular and subclavian veins. Critical Value/emergent results were called by telephone at the time of interpretation on 07/23/2024 at 3:38 pm to provider TINNIE DAWN ,  who verbally acknowledged these results. Electronically Signed   By: Luke Bun M.D.   On: 07/23/2024 15:39    ASSESSMENT & PLAN:   Chemotherapy follow-up-status post cycle 1 of 5-FU and mitomycin  on 07/31/2024 with Dr. Babara Occlusive thrombosis of the Port-A-Cath - managed by Dr Babara. She prefers to avoid  PICC.  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 AntiXa drawn today. Dr Babara to follow up.  Nausea-chemotherapy related. S/p dexamethasone  with IVF and zofran . Recommend starting olazanpine 5 mg at bedtime. Discussed this may also improve appetite.  Mucositis- start magic mouthwash- swish and spit. No hx of HSV1 lesions. No apparent yeast component. Question chemotherapy induced mucositis. Recommend oral hygiene with gentle brushing, soft/bland foods, topical anesthetics. At next chemo consider cryotherapy prophylaxis with ice chips during 5-FU infusion. Monitor for secondary infections.  Diarrhea- no improvement with maximizing imodium . REcommend starting lomotil . 1-2 tablets q4h prn.  Rectal pain- due to known malignancy, radiation, and diarrhea. Recommend protective barrier cream such as desitin or vaseline. Can also use hydrocortisone  suppositories- prescription sent.  Orthostasis- IV fluids today and will plan to reevaluate next week for possible fluids.   Follow-up 1 week  lab (cbc, cmp), NP IV fluid 2 weeks lab (cbc, cmp), MD chemo  All questions were answered. The patient knows to call the clinic with any problems, questions or concerns.  Tinnie Dawn, DNP, AGNP-C, AOCNP [facility] [phone] (clinic)

## 2024-08-22 NOTE — Telephone Encounter (Addendum)
 Oral Oncology Pharmacist Encounter  Received new prescription for Xeloda (capecitabine) for the treatment of anal squamous cell carcinoma in conjunction with mitomycin -C, planned for duration of radiation therapy.   Patient is switching from 5-FU pump to oral Xeloda due to issues with PICC - patient will start Xeloda on day 28 of current cycle (08/29/24) and take only on days of radiation through 09/11/24.  CBC w/ Diff and BMP from 08/22/24 assessed, no relevant lab abnormalities requiring baseline dose adjustment required at this time.  Current medication list in Epic reviewed, DDIs with Xeloda identified: Category C DDI between Xeloda and Esomeprazole  - proton-pump inhibitors can decrease efficacy of Xeloda - will discuss with patient alternatives to esomeprazole , such as H2RA's like famotidine  while on Xeloda. Category C DDI between Xeloda and Ondansetron  due to risk of Qtc prolongation with fluorouracil  products. Noted patient only taking PRN and PO route, risk higher with IV administration. No change in therapy warranted at this time.  Evaluated chart and no patient barriers to medication adherence noted.   Patient's insurance requires Xeloda be filled thorough [employer]. Prescription redirected for dispensing.   Oral Oncology Clinic will continue to follow for insurance authorization, copayment issues, initial counseling and start date.  Asberry Macintosh, PharmD, BCPS, BCOP Hematology/Oncology Clinical Pharmacist [phone] 08/22/2024 3:11 PM

## 2024-08-22 NOTE — Telephone Encounter (Signed)
 Checked with Dr. Tye and he is ok for IR to remove port and place PICC line. Message sent to IR. Date pending.

## 2024-08-23 ENCOUNTER — Other Ambulatory Visit: Payer: Self-pay

## 2024-08-23 ENCOUNTER — Ambulatory Visit
Admission: RE | Admit: 2024-08-23 | Discharge: 2024-08-23 | Disposition: A | Discharge status: Home / Self Care | Source: Ambulatory Visit | Attending: Radiation Oncology | Admitting: Radiation Oncology

## 2024-08-23 DIAGNOSIS — C210 Malignant neoplasm of anus, unspecified: Secondary | ICD-10-CM | POA: Diagnosis not present

## 2024-08-23 LAB — RAD ONC ARIA SESSION SUMMARY
Course Elapsed Days: 22
Plan Fractions Treated to Date: 17
Plan Prescribed Dose Per Fraction: 1.8 Gy
Plan Total Fractions Prescribed: 30
Plan Total Prescribed Dose: 54 Gy
Reference Point Dosage Given to Date: 30.6 Gy
Reference Point Session Dosage Given: 1.8 Gy
Session Number: 17

## 2024-08-23 LAB — MISC LABCORP TEST (SEND OUT): Labcorp test code: 117101

## 2024-08-25 ENCOUNTER — Other Ambulatory Visit: Payer: Self-pay | Admitting: Oncology

## 2024-08-25 ENCOUNTER — Encounter: Payer: Self-pay | Admitting: Nurse Practitioner

## 2024-08-25 MED ORDER — OXYCODONE HCL 5 MG PO TABS: 5.0000 mg | ORAL_TABLET | Freq: Four times a day (QID) | ORAL | 0 refills | Status: DC | PRN

## 2024-08-25 MED ORDER — FENTANYL 25 MCG/HR TD PT72: 1.0000 | MEDICATED_PATCH | TRANSDERMAL | 0 refills | Status: DC

## 2024-08-26 ENCOUNTER — Telehealth: Payer: Self-pay | Admitting: Licensed Clinical Social Worker

## 2024-08-26 ENCOUNTER — Other Ambulatory Visit: Payer: Self-pay

## 2024-08-26 ENCOUNTER — Ambulatory Visit
Admission: RE | Admit: 2024-08-26 | Discharge: 2024-08-26 | Disposition: A | Source: Ambulatory Visit | Attending: Radiation Oncology | Admitting: Radiation Oncology

## 2024-08-26 DIAGNOSIS — C210 Malignant neoplasm of anus, unspecified: Secondary | ICD-10-CM | POA: Diagnosis not present

## 2024-08-26 LAB — RAD ONC ARIA SESSION SUMMARY
Course Elapsed Days: 25
Plan Fractions Treated to Date: 18
Plan Prescribed Dose Per Fraction: 1.8 Gy
Plan Total Fractions Prescribed: 30
Plan Total Prescribed Dose: 54 Gy
Reference Point Dosage Given to Date: 32.4 Gy
Reference Point Session Dosage Given: 1.8 Gy
Session Number: 18

## 2024-08-26 NOTE — Telephone Encounter (Signed)
 I contacted Ms. Dorris to discuss her genetic testing results. No pathogenic variants were identified in the 77 genes analyzed. Detailed clinic note to follow.   The test report has been scanned into EPIC and is located under the Molecular Pathology section of the Results Review tab.  A portion of the result report is included below for reference.      Dena Cary, MS, [facility] Genetic Counselor [email or web address] Phone: [phone]

## 2024-08-27 ENCOUNTER — Other Ambulatory Visit: Payer: Self-pay

## 2024-08-27 ENCOUNTER — Ambulatory Visit
Admission: RE | Admit: 2024-08-27 | Discharge: 2024-08-27 | Disposition: A | Discharge status: Home / Self Care | Source: Ambulatory Visit | Attending: Radiation Oncology | Admitting: Radiation Oncology

## 2024-08-27 ENCOUNTER — Encounter: Payer: Self-pay | Admitting: Oncology

## 2024-08-27 ENCOUNTER — Encounter: Payer: Self-pay | Admitting: Licensed Clinical Social Worker

## 2024-08-27 ENCOUNTER — Ambulatory Visit: Payer: Self-pay | Admitting: Licensed Clinical Social Worker

## 2024-08-27 DIAGNOSIS — C210 Malignant neoplasm of anus, unspecified: Secondary | ICD-10-CM | POA: Diagnosis not present

## 2024-08-27 DIAGNOSIS — Z1379 Encounter for other screening for genetic and chromosomal anomalies: Secondary | ICD-10-CM | POA: Insufficient documentation

## 2024-08-27 LAB — RAD ONC ARIA SESSION SUMMARY
Course Elapsed Days: 26
Plan Fractions Treated to Date: 19
Plan Prescribed Dose Per Fraction: 1.8 Gy
Plan Total Fractions Prescribed: 30
Plan Total Prescribed Dose: 54 Gy
Reference Point Dosage Given to Date: 34.2 Gy
Reference Point Session Dosage Given: 1.8 Gy
Session Number: 19

## 2024-08-27 NOTE — Telephone Encounter (Signed)
 Oral Chemotherapy Pharmacist Encounter  I spoke with patient for overview of: Xeloda (capecitabine) for the treatment of anal squamous cell carcinoma in conjunction with mitomycin -C, planned for duration of radiation therapy   Treatment goal: Curative  Counseled patient on administration, dosing, side effects, monitoring, drug-food interactions, safe handling, storage, and disposal.  Patient will take Xeloda 500mg  tablets, 3 tablets (1500mg ) by mouth in AM and 3 tabs (1500mg ) by mouth in PM, within 30 minutes of finishing meals, only on days of radiation, Monday through Friday.   Patient will start Xeloda on 08/29/24 AM and take last dose on 09/11/24 PM.  Adverse effects include but are not limited to: fatigue, decreased blood counts, GI upset, diarrhea, mouth sores, and hand-foot syndrome. Hand-foot syndrome: discussed use of cream such as [place] Smooth Extra Care 20 or equivalent advanced care cream that has 20% urea content for advanced skin hydration while on Xeloda. Additionally discussed use of OTC Voltaren  gel for HFS prophylaxis. Discussed recommended use of Voltaren  gel is 1 finger tip application for front/backside of hands and then 1 fingertip application to bottoms of feet twice a day for up to 12 weeks.  Diarrhea: Patient will obtain Imodium (loperamide) to have on hand if they experience diarrhea. Patient knows to alert the office of 4 or more loose stools above baseline.  Reviewed with patient importance of keeping a medication schedule and plan for any missed doses. No barriers to medication adherence identified.  Medication reconciliation performed and medication/allergy list updated. Esomeprazole  - patient knows to hold PPI while they are on Xeloda and utilize famotidine  as needed while on Xeloda.   Distress thermometer flowsheet: Distress thermometer not completed during telephone call as patient has been on previous lines of therapy.   Communication and Learning  Assessment Primary learner: Patient Barriers to learning: No barriers Preferred language: English Learning preferences: Listening Reading  All questions answered.  Ms. Wages voiced understanding and appreciation.   Medication education handout and medication calendar placed in mail for patient. Patient knows to call the office with questions or concerns. Oral Chemotherapy Clinic phone number provided to patient.   Asberry Macintosh, PharmD, BCPS, BCOP Hematology/Oncology Clinical Pharmacist [phone] 08/27/2024 3:57 PM

## 2024-08-27 NOTE — Progress Notes (Signed)
 HPI:   Ms. Gagan was previously seen in the [facility] due to a personal and family history of cancer and concerns regarding a hereditary predisposition to cancer. Please refer to our prior cancer genetics clinic note for more information regarding our discussion, assessment and recommendations, at the time. Ms. Margulies recent genetic test results were disclosed to her, as were recommendations warranted by these results. These results and recommendations are discussed in more detail below.  CANCER HISTORY:  Oncology History  Anal squamous cell carcinoma (HCC)  05/29/2024 Initial Diagnosis   Anal squamous cell carcinoma   Patient has noticed a hard mass at the anal area which she initially thought was a hemorrhoid.  The mass has been present for more than a month and appears to be enlarging. Patient was referred to establish care with surgeon Dr. Tye  05/16/2024, incisional biopsy of anal mass showed 1. Anus, biopsy, mass :       - INVASIVE MODERATELY DIFFERENTIATED SQUAMOUS CELL CARCINOMA  06/19/2024, left inguinal adenopathy biopsy showed Metastatic moderately differentiated squamous cell carcinoma.  '    05/29/2024 Cancer Staging   Staging form: Anus, AJCC V9 - Clinical stage from 05/29/2024: Stage IIIA (cT3, cN1a, cM0) - Signed by Babara Call, MD on 06/26/2024 Stage prefix: Initial diagnosis   06/05/2024 Imaging   CT chest abdomen pelvis with contrast  Slight thickening in the area of the anal canal. Please correlate for history of neoplasm. There is a pathologic enlarged left inguinal node. There is a borderline right-sided node.   No additional areas of abnormal nodal enlargement or other aggressive appearing mass lesion at this time.   Fatty liver infiltration with known segment 7 hepatic hemangioma.   Of note there is a right-sided upper breast mass which was not clearly seen on prior CT scan. Please correlate for any prior study or if needed diagnostic  mammographic evaluation and possible ultrasound when appropriate.     06/12/2024 Imaging   CT chest abdomen pelvis with contrast showed Slight thickening in the area of the anal canal. Please correlate for history of neoplasm. There is a pathologic enlarged left inguinal node. There is a borderline right-sided node.   No additional areas of abnormal nodal enlargement or other aggressive appearing mass lesion at this time.   Fatty liver infiltration with known segment 7 hepatic hemangioma.   Of note there is a right-sided upper breast mass which was not clearly seen on prior CT scan. Please correlate for any prior study or if needed diagnostic mammographic evaluation and possible ultrasound when appropriate.   06/19/2024 Procedure   1. Lymph node, biopsy, left inguinal adenopathy :       -  METASTATIC MODERATELY DIFFERENTIATED SQUAMOUS CELL CARCINOMA    07/01/2024 Imaging   PET scan showed 1. Hypermetabolic anal mass with hypermetabolic left inguinal adenopathy, compatible with anorectal carcinoma. 2. Hypermetabolic right breast nodule with recent biopsies on 06/18/2024. Please correlate with pathology report.   07/31/2024 - 07/31/2024 Chemotherapy   Patient is on Treatment Plan : ANUS Mitomycin  D1,28 + 5FU D1-4, 28-31 q32d     08/23/2024 Genetic Testing   Negative genetic testing. No pathogenic variants identified on the [facility] CancerNext-Expanded+RNA Panel. The report date is 08/23/2024.  The CancerNext-Expanded gene panel offered by [facility] and includes sequencing, rearrangement, and RNA analysis for the following 77 genes: AIP, ALK, APC, ATM, AXIN2, BAP1, BARD1, BMPR1A, BRCA1, BRCA2, BRIP1, CDC73, CDH1, CDK4, CDKN1B, CDKN2A, CEBPA, CHEK2, CTNNA1, DDX41, DICER1, ETV6, FH,  FLCN, GATA2, LZTR1, MAX, MBD4, MEN1, MET, MLH1, MSH2, MSH3, MSH6, MUTYH, NF1, NF2, NTHL1, PALB2, PHOX2B, PMS2, POT1, PRKAR1A, PTCH1, PTEN, RAD51C, RAD51D, RB1, RET, RPS20, RUNX1, SDHA, SDHAF2, SDHB, SDHC,  SDHD, SMAD4, SMARCA4, SMARCB1, SMARCE1, STK11, SUFU, TMEM127, TP53, TSC1, TSC2, VHL, and WT1 (sequencing and deletion/duplication); EGFR, HOXB13, KIT, MITF, PDGFRA, POLD1, and POLE (sequencing only); EPCAM and GREM1 (deletion/duplication only).    08/29/2024 -  Chemotherapy   Patient is on Treatment Plan : ANUS Mitomycin  D1,29 + Capecitabine + XRT     Invasive carcinoma of breast (HCC)  06/12/2024 Imaging   CT chest abdomen pelvis with contrast showed Slight thickening in the area of the anal canal. Please correlate for history of neoplasm. There is a pathologic enlarged left inguinal node. There is a borderline right-sided node.   No additional areas of abnormal nodal enlargement or other aggressive appearing mass lesion at this time.   Fatty liver infiltration with known segment 7 hepatic hemangioma.   Of note there is a right-sided upper breast mass which was not clearly seen on prior CT scan. Please correlate for any prior study or if needed diagnostic mammographic evaluation and possible ultrasound when appropriate.   06/17/2024 Mammogram   1. There is a suspicious 17 mm mass at the site of CT and mammographic concern in the RIGHT breast. It demonstrates associated pleomorphic calcifications. Recommend ultrasound-guided biopsy for definitive characterization. 2. There is incidental sonographic note of a 7 mm mass in the RIGHT breast at 12 o'clock and a 6 mm non mass area at 9 o'clock. Recommend ultrasound-guided biopsy of these 2 areas for definitive characterization given suspicious appearance of the dominant mass. 3. No suspicious RIGHT axillary adenopathy. 4. No mammographic evidence of malignancy in the LEFT breast.   06/18/2024 Cancer Staging   Staging form: Breast, AJCC 8th Edition - Clinical stage from 06/18/2024: Stage IB (cT1b, cN0, cM0, G3, ER-, PR-, HER2-) - Signed by Babara Call, MD on 06/26/2024 Stage prefix: Initial diagnosis Histologic grading system: 3 grade system    06/18/2024 Initial Diagnosis   Invasive carcinoma of breast [facility])  Patient with right breast biopsy.  Pathology showed 1. Breast, right, needle core biopsy, 12:00 12cmfn (heart clip) :      INVASIVE DUCTAL CARCINOMA      TUBULE FORMATION: SCORE 3      NUCLEAR PLEOMORPHISM: SCORE 3      MITOTIC COUNT: SCORE 2      TOTAL SCORE: 8      OVERALL GRADE: 3      LYMPHOVASCULAR INVASION: NOT IDENTIFIED      CANCER LENGTH: 1.0 CM      CALCIFICATIONS: NOT IDENTIFIED      OTHER FINDINGS: NONE      ER negative, PR negative, HER2 negative (IHC 0) Ki-67 95%.       2. Breast, right, needle core biopsy, 12:00 8cmfn (coil clip) :      INVASIVE DUCTAL CARCINOMA      DUCTAL CARCINOMA IN SITU, SOLID, HIGH NUCLEAR GRADE WITH NECROSIS      TUBULE FORMATION: SCORE 3      NUCLEAR PLEOMORPHISM: SCORE 3      MITOTIC COUNT: SCORE 2      TOTAL SCORE: 8      OVERALL GRADE: 3      LYMPHOVASCULAR INVASION: NOT IDENTIFIED      CANCER LENGTH: 0.3 CM      CALCIFICATIONS: NOT IDENTIFIED      OTHER FINDINGS: NONE  3. Breast, right, needle core biopsy, 9:00 12cmfn (venus) :      BENIGN BREAST TISSUE WITH DENSE STROMAL FIBROSIS.      NEGATIVE FOR ATYPIA OR MALIGNANCY.    07/08/2024 Surgery   Patient went right breast lumpectomy and sentinel lymph node biopsy.  1. 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      - DUCTAL CARCINOMA IN SITU (DCIS).      - SEE CANCER SUMMARY AND NOTE BELOW.      - TWO BIOPSY SITES WITH CORRESPONDING HEART AND COIL CLIPS.      - TWO SAVI SCOUT TAGS.       2. Lymph node, sentinel, biopsy, Right :      - MICROMETASTATIC CARCINOMA INVOLVES ONE OF FIVE LYMPH NODES (1MI/5).      - SEE CANCER SUMMARY AND NOTE BELOW.       3. Breast, excision, Right medial posterior margin :      - BENIGN BREAST TISSUE.      - NEGATIVE FOR ATYPIA AND MALIGNANCY.      - SEE CANCER SUMMARY BELOW.   TUMOR Histologic Type:  Invasive carcinoma of no special type (ductal) Histologic Grade (Nottingham Histologic Score) Glandular (Acinar)/Tubular Differentiation: 3 Nuclear Pleomorphism: 3 Mitotic Rate: 3 Overall Grade: 3 Tumor Size: Greatest dimension of largest invasive focus: 19 mm Ductal Carcinoma In Situ (DCIS): Present, high-grade with comedonecrosis Lymphatic and/or Vascular Invasion: Present, extensive (LVI in 2 or more blocks)  Treatment Effect in the Breast: No known presurgical therapy MARGINS Margin Status for Invasive Carcinoma: Invasive Carcinoma involves inferior (unifocal) and superior (unifocal) margins and is 0.5 mm to anterior margin Margin Status for DCIS: All margins negative for DCIS Distance from DCIS to closest margin: 0.5 mm Specify closest margin: Inferior REGIONAL LYMPH NODES Regional Lymph Node Status: Tumor present in regional lymph node(s) Number of Lymph Nodes with Macrometastases (greater than 2 mm): 0 Number of Lymph Nodes with Micrometastases (greater than 0.2 mm to 2 mm and/or greater than 200 cells): 1 Number of Lymph Nodes with Isolated Tumor Cells (0.2 mm or less OR 200 cells or less): 0 Size of Largest Metastatic Deposit: 1.9 mm Extranodal Extension: Not identified Total Number of Lymph Nodes Examined (sentinel and non-sentinel): 5 Number of Sentinel Nodes Examined: 5 DISTANT METASTASIS Distant Site(s) Involved, if applicable: Not applicable PATHOLOGIC STAGE CLASSIFICATION (pTNM, AJCC 8th Edition): Modified Classification: Not applicable pT Category: pT1c T Suffix: (m) multiple primary synchronous tumors in a single organ pN Category: pN65mi N Suffix: (sn) pM Category: Not applicable SPECIAL STUDIES Breast Biomarker Testing Performed on Previous Biopsy: [id number] (heart) Estrogen Receptor: Negative (0%) Progesterone Receptor: Negative (0%) HER2 IHC: Negative (0) Proliferation Marker Ki67: 95%   07/22/2024 Cancer Staging   Staging form: Breast, AJCC 8th  Edition - Pathologic stage from 07/22/2024: Stage IB (pT1c, pN23mi(sn), cM0, G3, ER-, PR-, HER2-) - Signed by Babara Call, MD on 07/22/2024 Stage prefix: Initial diagnosis Method of lymph node assessment: Sentinel lymph node biopsy Multigene prognostic tests performed: None Histologic grading system: 3 grade system   07/25/2024 Surgery   Patient reports additional excision for positive margin.  1. Breast, excision, superior margin of right breast - RESIDUAL INVASIVE DUCTAL CARCINOMA, COMPLETELY EXCISED. - CARCINOMA PRESENT 2 MM FROM THE POSTERIOR MARGIN AND 4 MM FROM THE ANTERIOR AND SUPERIOR MARGINS. 2. Breast, excision, inferior margin of right breast - RESIDUAL INVASIVE DUCTAL CARCINOMA, COMPLETELY EXCISED. -  CARCINOMA PRESENT 1 MM FROM THE NEW INFERIOR MARGIN.   08/23/2024 Genetic Testing   Negative genetic testing. No pathogenic variants identified on the [facility] CancerNext-Expanded+RNA Panel. The report date is 08/23/2024.  The CancerNext-Expanded gene panel offered by [facility] and includes sequencing, rearrangement, and RNA analysis for the following 77 genes: AIP, ALK, APC, ATM, AXIN2, BAP1, BARD1, BMPR1A, BRCA1, BRCA2, BRIP1, CDC73, CDH1, CDK4, CDKN1B, CDKN2A, CEBPA, CHEK2, CTNNA1, DDX41, DICER1, ETV6, FH, FLCN, GATA2, LZTR1, MAX, MBD4, MEN1, MET, MLH1, MSH2, MSH3, MSH6, MUTYH, NF1, NF2, NTHL1, PALB2, PHOX2B, PMS2, POT1, PRKAR1A, PTCH1, PTEN, RAD51C, RAD51D, RB1, RET, RPS20, RUNX1, SDHA, SDHAF2, SDHB, SDHC, SDHD, SMAD4, SMARCA4, SMARCB1, SMARCE1, STK11, SUFU, TMEM127, TP53, TSC1, TSC2, VHL, and WT1 (sequencing and deletion/duplication); EGFR, HOXB13, KIT, MITF, PDGFRA, POLD1, and POLE (sequencing only); EPCAM and GREM1 (deletion/duplication only).      FAMILY HISTORY:  We obtained a detailed, 4-generation family history.  Significant diagnoses are listed below: Family History  Problem Relation Age of Onset   Diabetes Mother    Congestive Heart Failure Mother    Colon cancer Father  73   Lupus Maternal Grandmother    Cancer Maternal Grandfather        either prostate or colon   Colon cancer Paternal Grandfather    Lung cancer Paternal Grandfather    Ms. Buehrle has 2 sons and 1 daughter. She has 1 paternal half brother, 1 paternal half sister, no cancers.   Ms. Reno's father had colon cancer and passed at 67. Limited information about this side of the family. Paternal uncle had liver cancer. Paternal grandfather had lung and colon cancer over age 17.    Ms. Zachow's mother passed at 38. Maternal grandfather had cancer, either prostate or colon cancer.    Ms. Niehoff is unaware of previous family history of genetic testing for hereditary cancer risks. There is no reported Ashkenazi Jewish ancestry. There is no known consanguinity.      GENETIC TEST RESULTS:  The [facility] CancerNext-Expanded+RNA Panel found no pathogenic mutations.   The CancerNext-Expanded gene panel offered by [facility] and includes sequencing, rearrangement, and RNA analysis for the following 77 genes: AIP, ALK, APC, ATM, AXIN2, BAP1, BARD1, BMPR1A, BRCA1, BRCA2, BRIP1, CDC73, CDH1, CDK4, CDKN1B, CDKN2A, CEBPA, CHEK2, CTNNA1, DDX41, DICER1, ETV6, FH, FLCN, GATA2, LZTR1, MAX, MBD4, MEN1, MET, MLH1, MSH2, MSH3, MSH6, MUTYH, NF1, NF2, NTHL1, PALB2, PHOX2B, PMS2, POT1, PRKAR1A, PTCH1, PTEN, RAD51C, RAD51D, RB1, RET, RPS20, RUNX1, SDHA, SDHAF2, SDHB, SDHC, SDHD, SMAD4, SMARCA4, SMARCB1, SMARCE1, STK11, SUFU, TMEM127, TP53, TSC1, TSC2, VHL, and WT1 (sequencing and deletion/duplication); EGFR, HOXB13, KIT, MITF, PDGFRA, POLD1, and POLE (sequencing only); EPCAM and GREM1 (deletion/duplication only).   The test report has been scanned into EPIC and is located under the Molecular Pathology section of the Results Review tab.  A portion of the result report is included below for reference. Genetic testing reported out on 08/23/2024.      Even though a pathogenic variant was not identified, possible explanations for  the cancer in the family may include: There may be no hereditary risk for cancer in the family. The cancers in Ms. Molesworth and/or her family may be sporadic/familial or due to other genetic and environmental factors. There may be a gene mutation in one of these genes that current testing methods cannot detect but that chance is small. There could be another gene that has not yet been discovered, or that we have not yet tested, that is responsible for the cancer diagnoses in the  family.  It is also possible there is a hereditary cause for the cancer in the family that Ms. Tayloe did not inherit.  Therefore, it is important to remain in touch with cancer genetics in the future so that we can continue to offer Ms. Flinders the most up to date genetic testing.   ADDITIONAL GENETIC TESTING:  We discussed with Ms. Netherland that her genetic testing was fairly extensive.  If there are additional relevant genes identified to increase cancer risk that can be analyzed in the future, we would be happy to discuss and coordinate this testing at that time.    CANCER SCREENING RECOMMENDATIONS:  Ms. Holte's test result is considered negative (normal).  This means that we have not identified a hereditary cause for her personal and family history of cancer at this time.   An individual's cancer risk and medical management are not determined by genetic test results alone. Overall cancer risk assessment incorporates additional factors, including personal medical history, family history, and any available genetic information that may result in a personalized plan for cancer prevention and surveillance. Therefore, it is recommended she continue to follow the cancer management and screening guidelines provided by her oncology and primary healthcare provider.  RECOMMENDATIONS FOR FAMILY MEMBERS:   Since she did not inherit a identifiable mutation in a cancer predisposition gene included on this panel, her children could not have  inherited a known mutation from her in one of these genes. Individuals in this family might be at some increased risk of developing cancer, over the general population risk, due to the family history of cancer.  Individuals in the family should notify their providers of the family history of cancer. We recommend women in this family have a yearly mammogram beginning at age 31, or 81 years younger than the earliest onset of cancer, an annual clinical breast exam, and perform monthly breast self-exams.  Family members should have colonoscopies by at age 55, or earlier, as recommended by their providers. Other members of the family may still carry a pathogenic variant in one of these genes that Ms. Glassburn did not inherit. Based on the family history, we recommend her paternal relatives have genetic counseling and testing. Ms. Tipping will let us  know if we can be of any assistance in coordinating genetic counseling and/or testing for this family member.     FOLLOW-UP:  Lastly, we discussed with Ms. Caudill that cancer genetics is a rapidly advancing field and it is possible that new genetic tests will be appropriate for her and/or her family members in the future. We encouraged her to remain in contact with cancer genetics on an annual basis so we can update her personal and family histories and let her know of advances in cancer genetics that may benefit this family.   Our contact number was provided. Ms. Boberg's questions were answered to her satisfaction, and she knows she is welcome to call us  at anytime with additional questions or concerns.    Dena Cary, MS, [facility] Genetic Counselor [email or web address] Phone: [phone]

## 2024-08-27 NOTE — Telephone Encounter (Signed)
 Oral Oncology Patient Advocate Encounter  Patient called back and I was able to conference her in to the call with [facility] spec.   Delivery is scheduled for 08/28/2024.  Lonny Eisen (Patty) Chet Burnet, CPhT  [facility], [place]da [place], [facility] - Oral Chemotherapy Patient Advocate Specialist III Phone: [phone]  Fax: [phone]

## 2024-08-27 NOTE — Telephone Encounter (Signed)
 Oral Oncology Patient Advocate Encounter  I contacted [facility] specialty at [phone] to ask about a status update on the patient's medication.  The representative stated that the prescription was pending pharmacist review and they had a question about the length of radiation.  [place] was able to assist with answering the question.  The medication is now ready to be scheduled.  Patient can call [phone] or wait for a call from [facility].  Muhsin Doris (Patty) Chet Burnet, CPhT  [facility], [place]da [place], [facility] - Oral Chemotherapy Patient Advocate Specialist III Phone: [phone]  Fax: [phone]

## 2024-08-27 NOTE — Progress Notes (Signed)
 Patient for IR Port Removal & PICC Line Placement on Wed 08/28/24, I called and spoke with the patient on the phone and gave pre-procedure instructions. Pt was made aware to be here at 2:30p and check in at the new entrance. Pt stated understanding. Called 08/26/24

## 2024-08-28 ENCOUNTER — Ambulatory Visit
Admission: RE | Admit: 2024-08-28 | Discharge: 2024-08-28 | Disposition: A | Discharge status: Home / Self Care | Source: Ambulatory Visit | Attending: Radiation Oncology | Admitting: Radiation Oncology

## 2024-08-28 ENCOUNTER — Encounter: Payer: Self-pay | Admitting: Pulmonary Disease

## 2024-08-28 ENCOUNTER — Other Ambulatory Visit: Payer: Self-pay

## 2024-08-28 ENCOUNTER — Ambulatory Visit
Admission: RE | Admit: 2024-08-28 | Discharge: 2024-08-28 | Disposition: A | Discharge status: Home / Self Care | Source: Ambulatory Visit | Attending: Oncology | Admitting: Oncology

## 2024-08-28 DIAGNOSIS — Z452 Encounter for adjustment and management of vascular access device: Secondary | ICD-10-CM

## 2024-08-28 DIAGNOSIS — C210 Malignant neoplasm of anus, unspecified: Secondary | ICD-10-CM | POA: Diagnosis present

## 2024-08-28 HISTORY — PX: IR REMOVAL TUN ACCESS W/ PORT W/O FL MOD SED: IMG2290

## 2024-08-28 LAB — RAD ONC ARIA SESSION SUMMARY
Course Elapsed Days: 27
Plan Fractions Treated to Date: 20
Plan Prescribed Dose Per Fraction: 1.8 Gy
Plan Total Fractions Prescribed: 30
Plan Total Prescribed Dose: 54 Gy
Reference Point Dosage Given to Date: 36 Gy
Reference Point Session Dosage Given: 1.8 Gy
Session Number: 20

## 2024-08-28 MED ORDER — HEPARIN SOD (PORK) LOCK FLUSH 100 UNIT/ML IV SOLN
INTRAVENOUS | Status: AC
Start: 2024-08-28 — End: 2024-08-28
  Filled 2024-08-28: qty 5

## 2024-08-28 MED ORDER — LIDOCAINE HCL 1 % IJ SOLN
INTRAMUSCULAR | Status: AC
Start: 2024-08-28 — End: 2024-08-28
  Filled 2024-08-28: qty 20

## 2024-08-28 MED ORDER — LIDOCAINE-EPINEPHRINE 1 %-1:100000 IJ SOLN
10.0000 mL | Freq: Once | INTRAMUSCULAR | Status: AC
  Administered 2024-08-28: 10 mL via INTRADERMAL

## 2024-08-28 MED ORDER — LIDOCAINE HCL 1 % IJ SOLN
10.0000 mL | Freq: Once | INTRAMUSCULAR | Status: AC
  Administered 2024-08-28: 10 mL via INTRADERMAL

## 2024-08-28 MED ORDER — LIDOCAINE-EPINEPHRINE 1 %-1:100000 IJ SOLN
INTRAMUSCULAR | Status: AC
Start: 2024-08-28 — End: 2024-08-28
  Filled 2024-08-28: qty 1

## 2024-08-28 MED ORDER — HEPARIN SOD (PORK) LOCK FLUSH 100 UNIT/ML IV SOLN
500.0000 [IU] | Freq: Once | INTRAVENOUS | Status: AC
  Administered 2024-08-28: 500 [IU] via INTRAVENOUS

## 2024-08-28 NOTE — Procedures (Signed)
 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 Anal cancer  Procedure: 1) Placement of left arm PICC 2) Removal of right chest port  Findings: Left basilic vein PICC, single lumen, 39 cm, tip at SVC/RA junction.  Complete removal of right chest port.    Complications: None     EBL: Minimal  Plan:  Keep port incision dry for 24 hours   Kasarah Sitts R. Philip, MD  Pager: [phone]

## 2024-08-29 ENCOUNTER — Inpatient Hospital Stay: Admitting: Oncology

## 2024-08-29 ENCOUNTER — Inpatient Hospital Stay

## 2024-08-29 ENCOUNTER — Ambulatory Visit: Admitting: Oncology

## 2024-08-29 ENCOUNTER — Telehealth: Payer: Self-pay | Admitting: *Deleted

## 2024-08-29 ENCOUNTER — Other Ambulatory Visit: Payer: Self-pay

## 2024-08-29 ENCOUNTER — Ambulatory Visit

## 2024-08-29 ENCOUNTER — Ambulatory Visit
Admission: RE | Admit: 2024-08-29 | Discharge: 2024-08-29 | Disposition: A | Discharge status: Home / Self Care | Source: Ambulatory Visit | Attending: Radiation Oncology | Admitting: Radiation Oncology

## 2024-08-29 ENCOUNTER — Encounter: Payer: Self-pay | Admitting: Oncology

## 2024-08-29 ENCOUNTER — Other Ambulatory Visit

## 2024-08-29 VITALS — BP 116/68

## 2024-08-29 VITALS — BP 113/86 | HR 98 | Temp 98.9°F | Resp 16 | Wt 200.0 lb

## 2024-08-29 DIAGNOSIS — I2699 Other pulmonary embolism without acute cor pulmonale: Secondary | ICD-10-CM

## 2024-08-29 DIAGNOSIS — R110 Nausea: Secondary | ICD-10-CM

## 2024-08-29 DIAGNOSIS — C210 Malignant neoplasm of anus, unspecified: Secondary | ICD-10-CM

## 2024-08-29 DIAGNOSIS — T451X5A Adverse effect of antineoplastic and immunosuppressive drugs, initial encounter: Secondary | ICD-10-CM

## 2024-08-29 DIAGNOSIS — E876 Hypokalemia: Secondary | ICD-10-CM

## 2024-08-29 DIAGNOSIS — I82C11 Acute embolism and thrombosis of right internal jugular vein: Secondary | ICD-10-CM | POA: Diagnosis not present

## 2024-08-29 DIAGNOSIS — N390 Urinary tract infection, site not specified: Secondary | ICD-10-CM | POA: Diagnosis not present

## 2024-08-29 DIAGNOSIS — R7989 Other specified abnormal findings of blood chemistry: Secondary | ICD-10-CM

## 2024-08-29 DIAGNOSIS — C50919 Malignant neoplasm of unspecified site of unspecified female breast: Secondary | ICD-10-CM | POA: Diagnosis not present

## 2024-08-29 DIAGNOSIS — K627 Radiation proctitis: Secondary | ICD-10-CM

## 2024-08-29 DIAGNOSIS — A419 Sepsis, unspecified organism: Secondary | ICD-10-CM | POA: Diagnosis not present

## 2024-08-29 DIAGNOSIS — N179 Acute kidney failure, unspecified: Secondary | ICD-10-CM

## 2024-08-29 DIAGNOSIS — K521 Toxic gastroenteritis and colitis: Secondary | ICD-10-CM

## 2024-08-29 LAB — CBC WITH DIFFERENTIAL (CANCER CENTER ONLY)
Abs Immature Granulocytes: 0.18 K/uL — ABNORMAL HIGH (ref 0.00–0.07)
Basophils Absolute: 0 K/uL (ref 0.0–0.1)
Basophils Relative: 0 %
Eosinophils Absolute: 0 K/uL (ref 0.0–0.5)
Eosinophils Relative: 1 %
HCT: 31.1 % — ABNORMAL LOW (ref 36.0–46.0)
Hemoglobin: 10.1 g/dL — ABNORMAL LOW (ref 12.0–15.0)
Immature Granulocytes: 4 %
Lymphocytes Relative: 9 %
Lymphs Abs: 0.4 K/uL — ABNORMAL LOW (ref 0.7–4.0)
MCH: 31.7 pg (ref 26.0–34.0)
MCHC: 32.5 g/dL (ref 30.0–36.0)
MCV: 97.5 fL (ref 80.0–100.0)
Monocytes Absolute: 0.7 K/uL (ref 0.1–1.0)
Monocytes Relative: 14 %
Neutro Abs: 3.5 K/uL (ref 1.7–7.7)
Neutrophils Relative %: 72 %
Platelet Count: 223 K/uL (ref 150–400)
RBC: 3.19 MIL/uL — ABNORMAL LOW (ref 3.87–5.11)
RDW: 17.4 % — ABNORMAL HIGH (ref 11.5–15.5)
WBC Count: 4.8 K/uL (ref 4.0–10.5)
nRBC: 0 % (ref 0.0–0.2)

## 2024-08-29 LAB — CMP (CANCER CENTER ONLY)
ALT: 105 U/L — ABNORMAL HIGH (ref 0–44)
AST: 114 U/L — ABNORMAL HIGH (ref 15–41)
Albumin: 2.8 g/dL — ABNORMAL LOW (ref 3.5–5.0)
Alkaline Phosphatase: 109 U/L (ref 38–126)
Anion gap: 7 (ref 5–15)
BUN: 6 mg/dL (ref 6–20)
CO2: 23 mmol/L (ref 22–32)
Calcium: 8.2 mg/dL — ABNORMAL LOW (ref 8.9–10.3)
Chloride: 103 mmol/L (ref 98–111)
Creatinine: 0.73 mg/dL (ref 0.44–1.00)
GFR, Estimated: >60 mL/min (ref >60)
Glucose, Bld: 98 mg/dL (ref 70–99)
Potassium: 3.8 mmol/L (ref 3.5–5.1)
Sodium: 133 mmol/L — ABNORMAL LOW (ref 135–145)
Total Bilirubin: 0.9 mg/dL (ref 0.0–1.2)
Total Protein: 5.7 g/dL — ABNORMAL LOW (ref 6.5–8.1)

## 2024-08-29 LAB — RAD ONC ARIA SESSION SUMMARY
Course Elapsed Days: 28
Plan Fractions Treated to Date: 21
Plan Prescribed Dose Per Fraction: 1.8 Gy
Plan Total Fractions Prescribed: 30
Plan Total Prescribed Dose: 54 Gy
Reference Point Dosage Given to Date: 37.8 Gy
Reference Point Session Dosage Given: 1.8 Gy
Session Number: 21

## 2024-08-29 MED ORDER — SODIUM CHLORIDE 0.9 % IV SOLN
INTRAVENOUS | Status: DC
  Filled 2024-08-29: qty 250

## 2024-08-29 MED ORDER — PROCHLORPERAZINE MALEATE 10 MG PO TABS
10.0000 mg | ORAL_TABLET | Freq: Once | ORAL | Status: AC
  Administered 2024-08-29: 10 mg via ORAL
  Filled 2024-08-29: qty 1

## 2024-08-29 MED ORDER — MITOMYCIN CHEMO IV INJECTION 20 MG
10.0000 mg/m2 | Freq: Once | INTRAVENOUS | Status: AC
  Administered 2024-08-29: 19 mg via INTRAVENOUS
  Filled 2024-08-29: qty 38

## 2024-08-29 NOTE — Assessment & Plan Note (Signed)
 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 mT1c pN70mi Initial margin was positive and patient underwent reexcision and achieved a negative margin.  Patient's case is complicated as evidenced by 2 cancer primaries.  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 Plan to have proceed with concurrent chemotherapy and radiation treatment for Stage III anal SCC first.  After that she will need to have adjuvant chemotherapy for triple negative breast cancer.  She will need genetic testing.  Will discuss in the future.

## 2024-08-29 NOTE — Assessment & Plan Note (Signed)
 Continue home antiemetics as needed.  Added lorazepam 0.5 every 12 hours as needed anxiety and nausea.  Potential side effects were reviewed with patient.

## 2024-08-29 NOTE — Assessment & Plan Note (Signed)
 Recommend patient to take potassium 20 mEq daily.  Potassium has improved

## 2024-08-29 NOTE — Assessment & Plan Note (Signed)
 Anusol rectal cream topically

## 2024-08-29 NOTE — Progress Notes (Signed)
 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  Per IR, right inguinal lymph node is not enlarged.  Previously on concurrent chemotherapy 5-FU and mitomycin  C with Radiation. S/p D1 5-FU and Mitomycin -C. Patient is on radiation.  Medi port was occluded and has been removed. 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  Patient has PICC line placement.  This is a complicated case.  Continuous 5-FU infusion via PICC line is not considered safe.  Recommend switch to Xeloda 1500 mg twice daily on days of radiation.  Proceed with Mitomycin -C today.  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 mT1c pN6mi Initial margin was positive and patient underwent reexcision and achieved a negative margin.  Patient's case is complicated as evidenced by 2 cancer primaries.  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 Plan to have proceed with concurrent chemotherapy and radiation treatment for Stage III anal SCC first.  After that she will need to have adjuvant chemotherapy for triple negative breast cancer.  She will need genetic testing.  Will discuss in the future.  Internal jugular vein thrombosis, right (HCC) acute occlusive DVT involving the right internal jugular and subclavian veins. Continue Lovenox  1 mg/kg twice daily-80 mg every 12 hours.  Previously anti Xa level was checked and indicated patient is in therapeutic/supratherapeutic range.  Will repeat levels at  the next visit.  I suspect the right upper extremity swelling, right facial swelling are likely secondary to thrombosis, rather than allergic reaction to Xeloda. I will refer patient to establish care for vascular surgeon for evaluation of feasibility of thrombectomy  Acute subsegmental pulmonary embolism (HCC) Continue Lovenox  1 mg/kg twice daily.  Chemotherapy-induced nausea Continue home antiemetics as needed.  Added lorazepam 0.5 every 12 hours as needed anxiety and nausea.  Potential side effects were reviewed with patient.  Chemotherapy induced diarrhea Secondary to chemotherapy and radiation.  Recommend Imodium as needed.  AKI (acute kidney injury) Resolved.  Encourage oral hydration  Elevated LFTs Multifactorial, possible secondary to chemotherapy, fatty liver disease.  Bilirubin level is normal. Recommend patient to hold off using Tylenol .  No need for dose adjustment for Mitomycin -C and Xeloda.   Hypokalemia Recommend patient to take potassium 20 mEq daily.  Potassium has improved  Radiation proctitis Anusol rectal cream topically     Orders Placed This Encounter  Procedures   CBC with Differential (Cancer Center Only)    Standing Status:   Future    Expected Date:   09/12/2024    Expiration Date:   12/11/2024   CMP (Cancer Center only)    Standing Status:   Future    Expected Date:   09/12/2024    Expiration Date:   12/11/2024   CBC with Differential (Cancer Center Only)    Standing Status:   Future    Expected Date:   09/05/2024    Expiration Date:   12/04/2024   CMP (Cancer Center only)    Standing Status:   Future    Expected Date:   09/05/2024    Expiration Date:  12/04/2024   Ambulatory referral to Vascular Surgery    Referral Priority:   Routine    Referral Type:   Surgical    Referral Reason:   Specialty Services Required    Requested Specialty:   Vascular Surgery    Number of Visits Requested:   1   Follow-up 1 week  lab NP IV fluid 2 weeks  lab MD All questions were answered. The patient knows to call the clinic with any problems, questions or concerns.  [name], MD, PhD [facility] Hematology Oncology 08/29/2024    HISTORY OF PRESENTING ILLNESS:  Monica Gonzalez 48 y.o. 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 Summary of oncologic history is as follows: Oncology History  Anal squamous cell carcinoma (HCC)  05/29/2024 Initial Diagnosis   Anal squamous cell carcinoma   Patient has noticed a hard mass at the anal area which she initially thought was a hemorrhoid.  The mass has been present for more than a month and appears to be enlarging. Patient was referred to establish care with surgeon Dr. Tye  05/16/2024, incisional biopsy of anal mass showed 1. Anus, biopsy, mass :       - INVASIVE MODERATELY DIFFERENTIATED SQUAMOUS CELL CARCINOMA  06/19/2024, left inguinal adenopathy biopsy showed Metastatic moderately differentiated squamous cell carcinoma.  '    05/29/2024 Cancer Staging   Staging form: Anus, AJCC V9 - Clinical stage from 05/29/2024: Stage IIIA (cT3, cN1a, cM0) - Signed by Cap Zelphia, MD on 06/26/2024 Stage prefix: Initial diagnosis   06/05/2024 Imaging   CT chest abdomen pelvis with contrast  Slight thickening in the area of the anal canal. Please correlate for history of neoplasm. There is a pathologic enlarged left inguinal node. There is a borderline right-sided node.   No additional areas of abnormal nodal enlargement or other aggressive appearing mass lesion at this time.   Fatty liver infiltration with known segment 7 hepatic hemangioma.   Of note there is a right-sided upper breast mass which was not clearly seen on prior CT scan. Please correlate for any prior study or if needed diagnostic mammographic evaluation and  possible ultrasound when appropriate.     06/12/2024 Imaging   CT chest abdomen pelvis with contrast showed Slight thickening in the area of the anal canal. Please correlate for history of neoplasm. There is a pathologic enlarged left inguinal node. There is a borderline right-sided node.   No additional areas of abnormal nodal enlargement or other aggressive appearing mass lesion at this time.   Fatty liver infiltration with known segment 7 hepatic hemangioma.   Of note there is a right-sided upper breast mass which was not clearly seen on prior CT scan. Please correlate for any prior study or if needed diagnostic mammographic evaluation and possible ultrasound when appropriate.   06/19/2024 Procedure   1. Lymph node, biopsy, left inguinal adenopathy :       -  METASTATIC MODERATELY DIFFERENTIATED SQUAMOUS CELL CARCINOMA    07/01/2024 Imaging   PET scan showed 1. Hypermetabolic anal mass with hypermetabolic left inguinal adenopathy, compatible with anorectal carcinoma. 2. Hypermetabolic right breast nodule with recent biopsies on 06/18/2024. Please correlate with pathology report.   07/31/2024 - 07/31/2024 Chemotherapy   Patient is on Treatment Plan : ANUS Mitomycin  D1,28 + 5FU D1-4, 28-31 q32d     08/23/2024  Genetic Testing   Negative genetic testing. No pathogenic variants identified on the [facility] CancerNext-Expanded+RNA Panel. The report date is 08/23/2024.  The CancerNext-Expanded gene panel offered by [facility] and includes sequencing, rearrangement, and RNA analysis for the following 77 genes: AIP, ALK, APC, ATM, AXIN2, BAP1, BARD1, BMPR1A, BRCA1, BRCA2, BRIP1, CDC73, CDH1, CDK4, CDKN1B, CDKN2A, CEBPA, CHEK2, CTNNA1, DDX41, DICER1, ETV6, FH, FLCN, GATA2, LZTR1, MAX, MBD4, MEN1, MET, MLH1, MSH2, MSH3, MSH6, MUTYH, NF1, NF2, NTHL1, PALB2, PHOX2B, PMS2, POT1, PRKAR1A, PTCH1, PTEN, RAD51C, RAD51D, RB1, RET, RPS20, RUNX1, SDHA, SDHAF2, SDHB, SDHC, SDHD, SMAD4, SMARCA4, SMARCB1,  SMARCE1, STK11, SUFU, TMEM127, TP53, TSC1, TSC2, VHL, and WT1 (sequencing and deletion/duplication); EGFR, HOXB13, KIT, MITF, PDGFRA, POLD1, and POLE (sequencing only); EPCAM and GREM1 (deletion/duplication only).    08/29/2024 -  Chemotherapy   Patient is on Treatment Plan : ANUS Mitomycin  D1,29 + Capecitabine + XRT     Invasive carcinoma of breast (HCC)  06/12/2024 Imaging   CT chest abdomen pelvis with contrast showed Slight thickening in the area of the anal canal. Please correlate for history of neoplasm. There is a pathologic enlarged left inguinal node. There is a borderline right-sided node.   No additional areas of abnormal nodal enlargement or other aggressive appearing mass lesion at this time.   Fatty liver infiltration with known segment 7 hepatic hemangioma.   Of note there is a right-sided upper breast mass which was not clearly seen on prior CT scan. Please correlate for any prior study or if needed diagnostic mammographic evaluation and possible ultrasound when appropriate.   06/17/2024 Mammogram   1. There is a suspicious 17 mm mass at the site of CT and mammographic concern in the RIGHT breast. It demonstrates associated pleomorphic calcifications. Recommend ultrasound-guided biopsy for definitive characterization. 2. There is incidental sonographic note of a 7 mm mass in the RIGHT breast at 12 o'clock and a 6 mm non mass area at 9 o'clock. Recommend ultrasound-guided biopsy of these 2 areas for definitive characterization given suspicious appearance of the dominant mass. 3. No suspicious RIGHT axillary adenopathy. 4. No mammographic evidence of malignancy in the LEFT breast.   06/18/2024 Cancer Staging   Staging form: Breast, AJCC 8th Edition - Clinical stage from 06/18/2024: Stage IB (cT1b, cN0, cM0, G3, ER-, PR-, HER2-) - Signed by Babara Call, MD on 06/26/2024 Stage prefix: Initial diagnosis Histologic grading system: 3 grade system   06/18/2024 Initial Diagnosis    Invasive carcinoma of breast [facility])  Patient with right breast biopsy.  Pathology showed 1. Breast, right, needle core biopsy, 12:00 12cmfn (heart clip) :      INVASIVE DUCTAL CARCINOMA      TUBULE FORMATION: SCORE 3      NUCLEAR PLEOMORPHISM: SCORE 3      MITOTIC COUNT: SCORE 2      TOTAL SCORE: 8      OVERALL GRADE: 3      LYMPHOVASCULAR INVASION: NOT IDENTIFIED      CANCER LENGTH: 1.0 CM      CALCIFICATIONS: NOT IDENTIFIED      OTHER FINDINGS: NONE      ER negative, PR negative, HER2 negative (IHC 0) Ki-67 95%.       2. Breast, right, needle core biopsy, 12:00 8cmfn (coil clip) :      INVASIVE DUCTAL CARCINOMA      DUCTAL CARCINOMA IN SITU, SOLID, HIGH NUCLEAR GRADE WITH NECROSIS      TUBULE FORMATION: SCORE 3      NUCLEAR PLEOMORPHISM:  SCORE 3      MITOTIC COUNT: SCORE 2      TOTAL SCORE: 8      OVERALL GRADE: 3      LYMPHOVASCULAR INVASION: NOT IDENTIFIED      CANCER LENGTH: 0.3 CM      CALCIFICATIONS: NOT IDENTIFIED      OTHER FINDINGS: NONE            3. Breast, right, needle core biopsy, 9:00 12cmfn (venus) :      BENIGN BREAST TISSUE WITH DENSE STROMAL FIBROSIS.      NEGATIVE FOR ATYPIA OR MALIGNANCY.    07/08/2024 Surgery   Patient went right breast lumpectomy and sentinel lymph node biopsy.  1. 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      - DUCTAL CARCINOMA IN SITU (DCIS).      - SEE CANCER SUMMARY AND NOTE BELOW.      - TWO BIOPSY SITES WITH CORRESPONDING HEART AND COIL CLIPS.      - TWO SAVI SCOUT TAGS.       2. Lymph node, sentinel, biopsy, Right :      - MICROMETASTATIC CARCINOMA INVOLVES ONE OF FIVE LYMPH NODES (1MI/5).      - SEE CANCER SUMMARY AND NOTE BELOW.       3. Breast, excision, Right medial posterior margin :      - BENIGN BREAST TISSUE.      - NEGATIVE FOR ATYPIA AND MALIGNANCY.      - SEE CANCER SUMMARY BELOW.   TUMOR Histologic Type: Invasive carcinoma of no special  type (ductal) Histologic Grade (Nottingham Histologic Score) Glandular (Acinar)/Tubular Differentiation: 3 Nuclear Pleomorphism: 3 Mitotic Rate: 3 Overall Grade: 3 Tumor Size: Greatest dimension of largest invasive focus: 19 mm Ductal Carcinoma In Situ (DCIS): Present, high-grade with comedonecrosis Lymphatic and/or Vascular Invasion: Present, extensive (LVI in 2 or more blocks)  Treatment Effect in the Breast: No known presurgical therapy MARGINS Margin Status for Invasive Carcinoma: Invasive Carcinoma involves inferior (unifocal) and superior (unifocal) margins and is 0.5 mm to anterior margin Margin Status for DCIS: All margins negative for DCIS Distance from DCIS to closest margin: 0.5 mm Specify closest margin: Inferior REGIONAL LYMPH NODES Regional Lymph Node Status: Tumor present in regional lymph node(s) Number of Lymph Nodes with Macrometastases (greater than 2 mm): 0 Number of Lymph Nodes with Micrometastases (greater than 0.2 mm to 2 mm and/or greater than 200 cells): 1 Number of Lymph Nodes with Isolated Tumor Cells (0.2 mm or less OR 200 cells or less): 0 Size of Largest Metastatic Deposit: 1.9 mm Extranodal Extension: Not identified Total Number of Lymph Nodes Examined (sentinel and non-sentinel): 5 Number of Sentinel Nodes Examined: 5 DISTANT METASTASIS Distant Site(s) Involved, if applicable: Not applicable PATHOLOGIC STAGE CLASSIFICATION (pTNM, AJCC 8th Edition): Modified Classification: Not applicable pT Category: pT1c T Suffix: (m) multiple primary synchronous tumors in a single organ pN Category: pN4mi N Suffix: (sn) pM Category: Not applicable SPECIAL STUDIES Breast Biomarker Testing Performed on Previous Biopsy: [id number] (heart) Estrogen Receptor: Negative (0%) Progesterone Receptor: Negative (0%) HER2 IHC: Negative (0) Proliferation Marker Ki67: 95%   07/22/2024 Cancer Staging   Staging form: Breast, AJCC 8th Edition - Pathologic stage from  07/22/2024: Stage IB (pT1c, pN42mi(sn), cM0, G3, ER-, PR-, HER2-) - Signed by Babara Call, MD on 07/22/2024 Stage prefix: Initial diagnosis Method of lymph node assessment: Sentinel lymph node biopsy Multigene prognostic  tests performed: None Histologic grading system: 3 grade system   07/25/2024 Surgery   Patient reports additional excision for positive margin.  1. Breast, excision, superior margin of right breast - RESIDUAL INVASIVE DUCTAL CARCINOMA, COMPLETELY EXCISED. - CARCINOMA PRESENT 2 MM FROM THE POSTERIOR MARGIN AND 4 MM FROM THE ANTERIOR AND SUPERIOR MARGINS. 2. Breast, excision, inferior margin of right breast - RESIDUAL INVASIVE DUCTAL CARCINOMA, COMPLETELY EXCISED. - CARCINOMA PRESENT 1 MM FROM THE NEW INFERIOR MARGIN.   08/23/2024 Genetic Testing   Negative genetic testing. No pathogenic variants identified on the [facility] CancerNext-Expanded+RNA Panel. The report date is 08/23/2024.  The CancerNext-Expanded gene panel offered by [facility] and includes sequencing, rearrangement, and RNA analysis for the following 77 genes: AIP, ALK, APC, ATM, AXIN2, BAP1, BARD1, BMPR1A, BRCA1, BRCA2, BRIP1, CDC73, CDH1, CDK4, CDKN1B, CDKN2A, CEBPA, CHEK2, CTNNA1, DDX41, DICER1, ETV6, FH, FLCN, GATA2, LZTR1, MAX, MBD4, MEN1, MET, MLH1, MSH2, MSH3, MSH6, MUTYH, NF1, NF2, NTHL1, PALB2, PHOX2B, PMS2, POT1, PRKAR1A, PTCH1, PTEN, RAD51C, RAD51D, RB1, RET, RPS20, RUNX1, SDHA, SDHAF2, SDHB, SDHC, SDHD, SMAD4, SMARCA4, SMARCB1, SMARCE1, STK11, SUFU, TMEM127, TP53, TSC1, TSC2, VHL, and WT1 (sequencing and deletion/duplication); EGFR, HOXB13, KIT, MITF, PDGFRA, POLD1, and POLE (sequencing only); EPCAM and GREM1 (deletion/duplication only).     Discussed the use of AI scribe software for clinical note transcription with the patient, who gave verbal consent to proceed.   Patient has a history of VIN 3 status post vulvectomy  07/11/2024,she was evaluated by cardiology with palpitations and atypical chest pain.  Chest pain deemed atypical and she went on to have a normal echo at 07/19/2024.   07/23/2024, patient was seen by symptom management for right neck pain.  She was seen by Tinnie Dawn.  Right upper extremity ultrasound was obtained which showed acute occlusive DVT involving the right internal jugular and subclavian veins.  Patient was started on Lovenox , she was supposed to be started on 1 mg/kg twice daily however was started off 40 mg twice daily which is a subtherapeutic dosage. She was asked to hold off the evening dose prior to her breast re-excision.  07/29/2024, patient presented emergency room due to shortness of breath and chest pain.  CTA angiogram showed subsegmental PE medially in the left lower lobe.  Shortness of breath was felt to be multifactorial secondary to asthma/bronchitis, pulmonary embolism.  Symptoms are better after supportive care and she was discharged on Lovenox  80 mg subcutaneously twice daily.  Today patient presented for post chemotherapy evaluation.  Patient is currently on radiation. + Chemotherapy-induced nausea, she takes on antiemetics with some relief. + Intermittent loose stools, she uses Imodium.  She experiences swelling in her hands and face, which began after taking a dose of Xeloda this morning. The facial swelling occurred post-medication intake, while the hand swelling was noted the previous night.   She is currently on Lovenox  for anticoagulation.  She experiences headaches, which are more intense on one side and possibly lead to a migraine. She took two Tylenol  last night, which eased the headache, but she did not have her migraine medication with her.    MEDICAL HISTORY:  Past Medical History:  Diagnosis Date   Anemia    Anginal pain    Anxiety    Arthritis    Asthma    Cancer (HCC)    Chronic pain syndrome 05/21/2023   DDD (degenerative disc disease), cervical 05/22/2023   Deep venous thrombosis (HCC)    Depression    Dyspnea    Fibromyalgia  GERD (gastroesophageal reflux disease)    Headache    Heart murmur    History of hiatal hernia    History of kidney stones    Lupus    Malignant neoplasm of upper-outer quadrant of right female breast, unspecified estrogen receptor status (HCC) 2025   Pneumonia    PTSD (post-traumatic stress disorder)    Skin candidiasis 05/29/2024   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    SURGICAL HISTORY: Past Surgical History:  Procedure Laterality Date   BREAST BIOPSY Right 06/18/2024   US  RT BREAST BX W LOC DEV 1ST LESION IMG BX SPEC US  GUIDE 06/18/2024 [facility]   BREAST BIOPSY Right 06/18/2024   US  RT BREAST BX W LOC DEV EA ADD LESION IMG BX SPEC US  GUIDE 06/18/2024 [facility]   BREAST BIOPSY Right 06/18/2024   US  RT BREAST BX W LOC DEV EA ADD LESION IMG BX SPEC US  GUIDE 06/18/2024 [facility]   BREAST BIOPSY Right 07/05/2024   US  RT BREAST SAVI/RF TAG 1ST LESION US  GUIDE 07/05/2024 [facility]   BREAST BIOPSY Right 07/05/2024   US  RT BREAST SAVI/RF TAG EA ADD'L LESION US  GUIDE 07/05/2024 [facility]   BREAST LUMPECTOMY Right 07/25/2024   Procedure: BREAST LUMPECTOMY;  Surgeon: Tye Millet, DO;  Location: [facility];  Service: General;  Laterality: Right;   BREAST LUMPECTOMY WITH RADIO FREQUENCY LOCALIZER Right 07/08/2024   Procedure: BREAST LUMPECTOMY WITH RADIO FREQUENCY LOCALIZER;  Surgeon: Tye Millet, DO;  Location: [facility];  Service: General;  Laterality: Right;   COLONOSCOPY WITH PROPOFOL  N/A 02/17/2023   Procedure: COLONOSCOPY WITH PROPOFOL ;  Surgeon: Maryruth Ole DASEN, MD;  Location: [facility];  Service: Endoscopy;  Laterality: N/A;   ESOPHAGOGASTRODUODENOSCOPY (EGD) WITH PROPOFOL  N/A 02/17/2023   Procedure: ESOPHAGOGASTRODUODENOSCOPY (EGD) WITH PROPOFOL ;  Surgeon: Maryruth Ole DASEN, MD;  Location: [facility];  Service: Endoscopy;  Laterality: N/A;   EXTRACORPOREAL SHOCK WAVE LITHOTRIPSY     IR CV LINE INJECTION  08/21/2024   IR  REMOVAL TUN ACCESS W/ PORT W/O FL MOD SED  08/28/2024   PORTACATH PLACEMENT Right 07/08/2024   Procedure: INSERTION, TUNNELED CENTRAL VENOUS DEVICE, WITH PORT;  Surgeon: Tye Millet, DO;  Location: [facility];  Service: General;  Laterality: Right;   RECTAL EXAM UNDER ANESTHESIA N/A 05/16/2024   Procedure: EXAM UNDER ANESTHESIA, RECTUM, INCISIONAL BIOPSY OF ANAL MASS;  Surgeon: Tye Millet, DO;  Location: [facility];  Service: General;  Laterality: N/A;   SENTINEL NODE BIOPSY Right 07/08/2024   Procedure: BIOPSY, LYMPH NODE, SENTINEL;  Surgeon: Tye Millet, DO;  Location: [facility];  Service: General;  Laterality: Right;   TONSILLECTOMY     TUBAL LIGATION     VULVECTOMY N/A 07/29/2020   Procedure: WIDE EXCISION VULVECTOMY;  Surgeon: Mancil Barter, MD;  Location: [facility];  Service: Gynecology;  Laterality: N/A;    SOCIAL HISTORY: Social History   Socioeconomic History   Marital status: Divorced    Spouse name: Not on file   Number of children: 1   Years of education: Not on file   Highest education level: Not on file  Occupational History   Not on file  Tobacco Use   Smoking status: Never   Smokeless tobacco: Never  Vaping Use   Vaping status: Never Used  Substance and Sexual Activity   Alcohol use: Not Currently   Drug use: No   Sexual activity: Yes  Other Topics Concern   Not on file  Social History Narrative   Not on file   Social  Drivers of Health   Financial Resource Strain: Medium Risk (05/15/2024)   Received from [facility]   Overall Financial Resource Strain (CARDIA)    Difficulty of Paying Living Expenses: Somewhat hard  Food Insecurity: No Food Insecurity (05/29/2024)   Hunger Vital Sign    Worried About Running Out of Food in the Last Year: Never true    Ran Out of Food in the Last Year: Never true  Transportation Needs: No Transportation Needs (05/29/2024)   PRAPARE - Administrator, Civil Service (Medical): No    Lack of  Transportation (Non-Medical): No  Physical Activity: Not on file  Stress: Not on file  Social Connections: Not on file  Intimate Partner Violence: Not At Risk (05/29/2024)   Humiliation, Afraid, Rape, and Kick questionnaire    Fear of Current or Ex-Partner: No    Emotionally Abused: No    Physically Abused: No    Sexually Abused: No    FAMILY HISTORY: Family History  Problem Relation Age of Onset   Diabetes Mother    Congestive Heart Failure Mother    Colon cancer Father 67   Lupus Maternal Grandmother    Cancer Maternal Grandfather        either prostate or colon   Colon cancer Paternal Grandfather    Lung cancer Paternal Grandfather     ALLERGIES:  is allergic to prednisone and silicone.  MEDICATIONS:  Current Outpatient Medications  Medication Sig Dispense Refill   albuterol  (PROVENTIL ) (2.5 MG/3ML) 0.083% nebulizer solution Take 2.5 mg by nebulization every 6 (six) hours as needed for shortness of breath or wheezing.     albuterol  (VENTOLIN  HFA) 108 (90 Base) MCG/ACT inhaler Inhale 2 puffs into the lungs every 4 (four) hours as needed for wheezing or shortness of breath. 8.5 g 2   ascorbic acid (VITAMIN C) 500 MG tablet Take 500 mg by mouth daily.     B Complex-C (SUPER B COMPLEX PO) Take by mouth daily.     benzonatate  (TESSALON ) 200 MG capsule Take 1 capsule (200 mg total) by mouth 3 (three) times daily as needed for cough. 30 capsule 1   BIOTIN PO Take by mouth daily.     budesonide  (PULMICORT ) 0.25 MG/2ML nebulizer solution Take 2 mLs (0.25 mg total) by nebulization 2 (two) times daily. 60 mL 12   busPIRone  (BUSPAR ) 5 MG tablet Take 5 mg by mouth 2 (two) times daily.     capecitabine (XELODA) 500 MG tablet Take 3 tablets (1,500 mg total) by mouth 2 (two) times daily after a meal. Take within 30 minutes after meals. Take only on days when you have radiation. Start on 08/29/24. 60 tablet 0   cholecalciferol (VITAMIN D3) 25 MCG (1000 UNIT) tablet Take 1,000 Units by mouth  daily.     clonazePAM (KLONOPIN) 0.5 MG tablet Take 0.5 mg by mouth at bedtime as needed (anxiety/sleep).     diphenoxylate-atropine (LOMOTIL) 2.5-0.025 MG tablet Take 1-2 tablets by mouth 4 (four) times daily as needed for diarrhea or loose stools. 30 tablet 0   DULoxetine  (CYMBALTA ) 30 MG capsule Take 30 mg by mouth at bedtime.     enoxaparin  (LOVENOX ) 80 MG/0.8ML injection Inject 0.8 mLs (80 mg total) into the skin every 12 (twelve) hours. 48 mL 1   esomeprazole  (NEXIUM ) 40 MG capsule Take 1 capsule (40 mg total) by mouth 2 (two) times daily before a meal. 60 capsule 2   fentaNYL  (DURAGESIC ) 25 MCG/HR Place 1 patch  onto the skin every 3 (three) days. 5 patch 0   gabapentin  (NEURONTIN ) 300 MG capsule Take 600 mg by mouth 2 (two) times daily.     guaiFENesin -codeine  100-10 MG/5ML syrup Take 5 mLs by mouth every 6 (six) hours as needed for cough. 120 mL 0   hydrocortisone (ANUSOL-HC) 2.5 % rectal cream Place 1 Application rectally 2 (two) times daily. 90 g 2   ipratropium (ATROVENT  HFA) 17 MCG/ACT inhaler Inhale 2 puffs into the lungs every 6 (six) hours as needed for wheezing (COUGH). 12.9 g 1   ipratropium-albuterol  (DUONEB) 0.5-2.5 (3) MG/3ML SOLN Take 3 mLs by nebulization 4 (four) times daily. 360 mL 3   lidocaine  (LIDODERM ) 5 % 1 patch daily.     lidocaine  (XYLOCAINE ) 2 % solution Use as directed 15 mLs in the mouth or throat every 4 (four) hours as needed for mouth pain. 500 mL 0   linaclotide (LINZESS) 145 MCG CAPS capsule Take 145 mcg by mouth daily before breakfast.     loperamide (IMODIUM A-D) 2 MG tablet Take 2 mg by mouth 4 (four) times daily as needed for diarrhea or loose stools.     LORazepam (ATIVAN) 0.5 MG tablet Take 1 tablet (0.5 mg total) by mouth every 12 (twelve) hours as needed for anxiety (nausea). 30 tablet 0   magic mouthwash (multi-ingredient) oral suspension Swish/Spit 5-10 mLs by mouth 4 (four) times daily as needed for mouth pain. 480 mL 1   montelukast  (SINGULAIR ) 10  MG tablet Take 1 tablet (10 mg total) by mouth See admin instructions. Take 1 tablet on the day prior to chemotherapy and take 1 tablet daily for 2 days after chemotherapy. 30 tablet 0   Multiple Vitamin (MULTIVITAMIN WITH MINERALS) TABS tablet Take 1 tablet by mouth daily.     nystatin  (MYCOSTATIN ) 100000 UNIT/ML suspension Take 5 mLs (500,000 Units total) by mouth 4 (four) times daily. Swish and swallow 473 mL 1   nystatin  (MYCOSTATIN /NYSTOP ) powder Apply 1 Application topically 2 (two) times daily. 20 g 0   OLANZapine (ZYPREXA) 5 MG tablet Take 1 tablet (5 mg total) by mouth at bedtime. 30 tablet 3   oxyCODONE  (OXY IR/ROXICODONE ) 5 MG immediate release tablet Take 1 tablet (5 mg total) by mouth every 6 (six) hours as needed for severe pain (pain score 7-10) or breakthrough pain. 30 tablet 0   potassium chloride  SA (KLOR-CON  M) 20 MEQ tablet Take 1 tablet (20 mEq total) by mouth daily. 30 tablet 1   promethazine -dextromethorphan  (PROMETHAZINE -DM) 6.25-15 MG/5ML syrup Take 5 mLs by mouth 4 (four) times daily as needed for cough. 180 mL 0   sucralfate  (CARAFATE ) 1 g tablet Take 1 tablet (1 g total) by mouth 4 (four) times daily -  with meals and at bedtime. Dissolve in water  30 tablet 1   traZODone  (DESYREL ) 50 MG tablet Take 50 mg by mouth at bedtime.     Turmeric (QC TUMERIC COMPLEX PO) Take by mouth daily.     ferrous sulfate 325 (65 FE) MG tablet Take 325 mg by mouth daily.  (Patient not taking: Reported on 08/29/2024)     [Paused] naproxen  (EC NAPROSYN ) 500 MG EC tablet Take 1 tablet (500 mg total) by mouth 2 (two) times daily with a meal. (Patient not taking: Reported on 08/29/2024) 60 tablet 0   No current facility-administered medications for this visit.    Review of Systems  Constitutional:  Positive for fatigue. Negative for chills and fever.  HENT:   Negative  for hearing loss and voice change.        Right side facial swelling  Eyes:  Negative for eye problems.  Respiratory:   Negative for chest tightness, cough and shortness of breath.   Cardiovascular:  Negative for chest pain.  Gastrointestinal:  Positive for diarrhea and nausea. Negative for abdominal distention, abdominal pain and blood in stool.       Anal mass with drainage  Endocrine: Negative for hot flashes.  Genitourinary:  Negative for difficulty urinating and frequency.   Musculoskeletal:  Negative for arthralgias.       Right hand swelling  Skin:  Negative for itching and rash.  Neurological:  Negative for extremity weakness.  Hematological:  Negative for adenopathy.  Psychiatric/Behavioral:  Negative for confusion.      PHYSICAL EXAMINATION: ECOG PERFORMANCE STATUS: 0 - Asymptomatic  Vitals:   08/29/24 1501  BP: 113/86  Pulse: 98  Resp: 16  Temp: 98.9 F (37.2 C)  SpO2: 98%   Filed Weights   08/29/24 1501  Weight: 200 lb (90.7 kg)    Physical Exam Constitutional:      General: She is not in acute distress.    Appearance: She is not diaphoretic.  HENT:     Head: Normocephalic and atraumatic.     Mouth/Throat:     Pharynx: No oropharyngeal exudate.  Eyes:     General: No scleral icterus. Cardiovascular:     Rate and Rhythm: Normal rate and regular rhythm.     Heart sounds: No murmur heard. Pulmonary:     Effort: Pulmonary effort is normal. No respiratory distress.     Breath sounds: No wheezing.  Abdominal:     General: There is no distension.     Palpations: Abdomen is soft.     Tenderness: There is no abdominal tenderness.  Genitourinary:    Comments: anal mass previously 5cm, decreased in size Musculoskeletal:        General: Normal range of motion.     Cervical back: Normal range of motion and neck supple.     Comments: Right hand swelling.   Skin:    General: Skin is warm and dry.     Findings: No erythema.  Neurological:     Mental Status: She is alert and oriented to person, place, and time. Mental status is at baseline.     Motor: No abnormal muscle tone.   Psychiatric:        Mood and Affect: Affect normal.         LABORATORY DATA:  I have reviewed the data as listed    Latest Ref Rng & Units 08/29/2024    2:37 PM 08/22/2024   10:52 AM 08/19/2024   12:28 PM  CBC  WBC 4.0 - 10.5 K/uL 4.8  4.0  2.5   Hemoglobin 12.0 - 15.0 g/dL 89.8  88.0  88.1   Hematocrit 36.0 - 46.0 % 31.1  37.3  36.9   Platelets 150 - 400 K/uL 223  245  228       Latest Ref Rng & Units 08/29/2024    2:37 PM 08/22/2024   10:52 AM 08/19/2024   12:28 PM  CMP  Glucose 70 - 99 mg/dL 98  89  94   BUN 6 - 20 mg/dL 6  8  10    Creatinine 0.44 - 1.00 mg/dL 9.26  8.94  9.10   Sodium 135 - 145 mmol/L 133  136  133   Potassium 3.5 - 5.1 mmol/L  3.8  3.1  4.3   Chloride 98 - 111 mmol/L 103  104  99   CO2 22 - 32 mmol/L 23  22  26    Calcium 8.9 - 10.3 mg/dL 8.2  8.4  8.7   Total Protein 6.5 - 8.1 g/dL 5.7   6.6   Total Bilirubin 0.0 - 1.2 mg/dL 0.9   0.9   Alkaline Phos 38 - 126 U/L 109   93   AST 15 - 41 U/L 114   33   ALT 0 - 44 U/L 105   32      RADIOGRAPHIC STUDIES: I have personally reviewed the radiological images as listed and agreed with the findings in the report. IR REMOVAL TUN ACCESS W/ PORT W/O FL MOD SED Result Date: 08/28/2024 INDICATION: 48 year old with malpositioned right jugular Port-A-Cath and thrombus in the right internal jugular vein. Patient needs removal of the Port-A-Cath and placement of a PICC line for treatment of anal cancer. EXAM: 1.  PICC LINE PLACEMENT WITH ULTRASOUND AND FLUOROSCOPIC GUIDANCE 2.  REMOVAL OF RIGHT CHEST PORT-A-CATH MEDICATIONS: No sedation ANESTHESIA/SEDATION: No sedation FLUOROSCOPY: Radiation Exposure Index (as provided by the fluoroscopic device): 8 mGy Kerma COMPLICATIONS: None immediate. PROCEDURE: The patient was advised of the possible risks and complications and agreed to undergo the procedure. The patient was then brought to the angiographic suite for the procedure. The left arm was prepped with chlorhexidine ,  draped in the usual sterile fashion using maximum barrier technique (cap and mask, sterile gown, sterile gloves, large sterile sheet, hand hygiene and cutaneous antiseptic). Local anesthesia was attained by infiltration with 1% lidocaine . Ultrasound demonstrated patency of the left basilic vein, and this was documented with an image. Under real-time ultrasound guidance, this vein was accessed with a 21 gauge micropuncture needle and image documentation was performed. The needle was exchanged over a guidewire for a peel-away sheath through which a 39 cm 5 [place] single lumen power injectable PICC was advanced, and positioned with its tip at the lower SVC/right atrial junction. Fluoroscopy during the procedure and fluoro spot radiograph confirms appropriate catheter position. The catheter was flushed, secured to the skin, and covered with a sterile dressing. The right chest was prepped and draped in a sterile fashion. 1% lidocaine  with epinephrine  was utilized for local anesthesia. An incision was made over the previously healed surgical incision. Utilizing blunt dissection, the port catheter and reservoir were removed from the underlying subcutaneous tissue in their entirety. Retention sutures were removed. The pocket was irrigated with a copious amount of sterile normal saline. The subcutaneous tissue was closed with 3-0 Vicryl interrupted subcutaneous stitches. A 4-0 Vicryl running subcuticular stitch was utilized to approximate the skin. Dermabond was applied. Dressing was placed. Fluoroscopic image was obtained after removal of the Port-A-Cath. IMPRESSION: Successful placement of a left arm PICC line with sonographic and fluoroscopic guidance. The catheter is ready for use. Successful removal of the right chest Port-A-Cath. Electronically Signed   By: Juliene Balder M.D.   On: 08/28/2024 16:45   IR PICC PLACEMENT LEFT >5 YRS INC IMG GUIDE Result Date: 08/28/2024 INDICATION: 48 year old with malpositioned right  jugular Port-A-Cath and thrombus in the right internal jugular vein. Patient needs removal of the Port-A-Cath and placement of a PICC line for treatment of anal cancer. EXAM: 1.  PICC LINE PLACEMENT WITH ULTRASOUND AND FLUOROSCOPIC GUIDANCE 2.  REMOVAL OF RIGHT CHEST PORT-A-CATH MEDICATIONS: No sedation ANESTHESIA/SEDATION: No sedation FLUOROSCOPY: Radiation Exposure Index (as provided by the fluoroscopic device): 8  mGy Kerma COMPLICATIONS: None immediate. PROCEDURE: The patient was advised of the possible risks and complications and agreed to undergo the procedure. The patient was then brought to the angiographic suite for the procedure. The left arm was prepped with chlorhexidine , draped in the usual sterile fashion using maximum barrier technique (cap and mask, sterile gown, sterile gloves, large sterile sheet, hand hygiene and cutaneous antiseptic). Local anesthesia was attained by infiltration with 1% lidocaine . Ultrasound demonstrated patency of the left basilic vein, and this was documented with an image. Under real-time ultrasound guidance, this vein was accessed with a 21 gauge micropuncture needle and image documentation was performed. The needle was exchanged over a guidewire for a peel-away sheath through which a 39 cm 5 [place] single lumen power injectable PICC was advanced, and positioned with its tip at the lower SVC/right atrial junction. Fluoroscopy during the procedure and fluoro spot radiograph confirms appropriate catheter position. The catheter was flushed, secured to the skin, and covered with a sterile dressing. The right chest was prepped and draped in a sterile fashion. 1% lidocaine  with epinephrine  was utilized for local anesthesia. An incision was made over the previously healed surgical incision. Utilizing blunt dissection, the port catheter and reservoir were removed from the underlying subcutaneous tissue in their entirety. Retention sutures were removed. The pocket was irrigated with a  copious amount of sterile normal saline. The subcutaneous tissue was closed with 3-0 Vicryl interrupted subcutaneous stitches. A 4-0 Vicryl running subcuticular stitch was utilized to approximate the skin. Dermabond was applied. Dressing was placed. Fluoroscopic image was obtained after removal of the Port-A-Cath. IMPRESSION: Successful placement of a left arm PICC line with sonographic and fluoroscopic guidance. The catheter is ready for use. Successful removal of the right chest Port-A-Cath. Electronically Signed   By: Juliene Balder M.D.   On: 08/28/2024 16:45   IR CV Line Injection Result Date: 08/21/2024 INDICATION: 48 year old female with a surgically placed right chest port catheter. Catheter is malfunctioning and the patient presents for port injection. EXAM: IR NON-TUNNELED CENTRAL VENOUS CATH PLC W IMG MEDICATIONS: None ANESTHESIA/SEDATION: None FLUOROSCOPY TIME:  Radiation exposure index: 10 mGy, air kerma COMPLICATIONS: None immediate. PROCEDURE: Informed written consent was obtained from the patient after a thorough discussion of the procedural risks, benefits and alternatives. All questions were addressed. Maximal Sterile Barrier Technique was utilized including caps, mask, sterile gowns, sterile gloves, sterile drape, hand hygiene and skin antiseptic. A timeout was performed prior to the initiation of the procedure. Fluoroscopic evaluation demonstrates a right IJ port catheter. The entry site into the internal jugular vein appears high and the external portion of the catheter tubing is redundant in the soft tissues of the right neck. Contrast was next injected. The tip of the catheter overlies the right innominate vein. No contrast enters the vein at this point, the contrast tracks back along the catheter where it fills retrograde into the internal jugular vein. A large tubular filling defect is present within the right internal jugular vein consistent with thrombus. No contrast material passes into  the innominate vein. Contrast material instead fills the anterior jugular vein an other venous collaterals draining into the left innominate vein. IMPRESSION: 1. Malpositioned right IJ approach port catheter with significant fibrin sheath completely occluding the catheter tip. 2. Occlusive thrombus is present within the right internal jugular vein. Recommend removal of this nonfunctional device and placement of a PICC to allow for continued venous access. After several weeks, right upper extremity duplex venous ultrasound could be performed  to assess patency of the right internal jugular vein and, if patent, placement of a new port catheter could be considered at that time. Electronically Signed   By: Wilkie Lent M.D.   On: 08/21/2024 15:26   CT ABDOMEN PELVIS W CONTRAST Result Date: 08/17/2024 EXAM: CT ABDOMEN AND PELVIS WITH CONTRAST 08/17/2024 11:29:22 PM TECHNIQUE: CT of the abdomen and pelvis was performed with the administration of intravenous contrast, (iohexol  (OMNIPAQUE ) 300 MG/ML solution). Multiplanar reformatted images are provided for review. Automated exposure control, iterative reconstruction, and/or weight-based adjustment of the mA/kV was utilized to reduce the radiation dose to as low as reasonably achievable. COMPARISON: Comparison made to June 05, 2024. CLINICAL HISTORY: N/V/D. Anal cancer. Patient presented to the ED from home via POV for loose stools that are green. Patient is nauseated with some abdominal pain. *tracking code: Bo* FINDINGS: LOWER CHEST: Visualized lung bases are clear. 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 The liver is otherwise unremarkable. GALLBLADDER AND BILE DUCTS: No mention of gallbladder or biliary ductal dilatation. SPLEEN: No acute abnormality. PANCREAS: No acute abnormality. ADRENAL GLANDS: No acute abnormality.  KIDNEYS, URETERS AND BLADDER: No stones in the kidneys or ureters. No hydronephrosis. No perinephric or periureteral stranding. Urinary bladder is unremarkable. GI AND BOWEL: Appendix is normal. The stomach, small bowel, and large bowel are otherwise unremarkable. PERITONEUM AND RETROPERITONEUM: Multiple inflammatory-appearing subcutaneous nodules are seen within the anterior abdominal wall, new since prior examination, likely related to subcutaneous injection. No ascites. No free air. VASCULATURE: Aorta is normal in caliber. LYMPH NODES: Heterogeneously enhancing enlarged left inguinal lymph node is again identified, measuring 2.4 cm in diameter, stable since prior examination. No additional pathologic adenopathy. REPRODUCTIVE ORGANS: No acute abnormality. BONES AND SOFT TISSUES: 3.3 x 5.0 cm complex fluid collection within the right breast, prior partial breast resection. No acute osseous abnormality. IMPRESSION: 1. No acute findings in the abdomen or pelvis. 2. 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 3. 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 No additional pathologic adenopathy or evidence of distal metastatic disease within the abdomen and pelvis. 4. Probable postsurgical changes within the right breast. Electronically signed by: Dorethia Molt MD 08/17/2024 11:43 PM EDT RP Workstation: [id number]   DG Chest 2 View Result Date: 08/13/2024 CLINICAL DATA:  Port-A-Cath placement. EXAM: CHEST - 2 VIEW COMPARISON:  July 29, 2024. FINDINGS: The heart size and mediastinal contours are within normal limits. Stable position of right internal jugular Port-A-Cath tip in expected position of upper SVC. Both lungs are clear. The visualized skeletal structures are unremarkable. IMPRESSION:  No active cardiopulmonary disease. Electronically Signed   By: Lynwood Landy Raddle M.D.   On: 08/13/2024 15:26   DG Shoulder Right Result Date: 08/08/2024 CLINICAL DATA:  right shoulder pain EXAM: RIGHT SHOULDER - 2+ VIEW COMPARISON:  None Available. FINDINGS: There is no evidence of fracture or dislocation. There is no evidence of arthropathy or aggressive appearing focal bone abnormality. Soft tissues are unremarkable. Accessed right chest wall catheter with tip overlying the expected region of the superior vena cava. IMPRESSION: No acute displaced fracture or dislocation. Electronically Signed   By: Morgane  Naveau M.D.   On: 08/08/2024 19:50

## 2024-08-29 NOTE — Patient Instructions (Signed)
 [facility] - A DEPT OF [place]  Discharge Instructions: Thank you for choosing [facility] to provide your oncology and hematology care.  If you have a lab appointment with the Cancer Center, please go directly to the Cancer Center and check in at the registration area.  Wear comfortable clothing and clothing appropriate for easy access to any Portacath or PICC line.   We strive to give you quality time with your provider. You may need to reschedule your appointment if you arrive late (15 or more minutes).  Arriving late affects you and other patients whose appointments are after yours.  Also, if you miss three or more appointments without notifying the office, you may be dismissed from the clinic at the provider's discretion.      For prescription refill requests, have your pharmacy contact our office and allow 72 hours for refills to be completed.    Today you received the following chemotherapy and/or immunotherapy agents MITAMYCIN      To help prevent nausea and vomiting after your treatment, we encourage you to take your nausea medication as directed.  BELOW ARE SYMPTOMS THAT SHOULD BE REPORTED IMMEDIATELY: *FEVER GREATER THAN 100.4 F (38 C) OR HIGHER *CHILLS OR SWEATING *NAUSEA AND VOMITING THAT IS NOT CONTROLLED WITH YOUR NAUSEA MEDICATION *UNUSUAL SHORTNESS OF BREATH *UNUSUAL BRUISING OR BLEEDING *URINARY PROBLEMS (pain or burning when urinating, or frequent urination) *BOWEL PROBLEMS (unusual diarrhea, constipation, pain near the anus) TENDERNESS IN MOUTH AND THROAT WITH OR WITHOUT PRESENCE OF ULCERS (sore throat, sores in mouth, or a toothache) UNUSUAL RASH, SWELLING OR PAIN  UNUSUAL VAGINAL DISCHARGE OR ITCHING   Items with * indicate a potential emergency and should be followed up as soon as possible or go to the Emergency Department if any problems should occur.  Please show the CHEMOTHERAPY ALERT CARD or IMMUNOTHERAPY  ALERT CARD at check-in to the Emergency Department and triage nurse.  Should you have questions after your visit or need to cancel or reschedule your appointment, please contact [facility] - A DEPT OF [facility]  [phone] and follow the prompts.  Office hours are 8:00 a.m. to 4:30 p.m. Monday - Friday. Please note that voicemails left after 4:00 p.m. may not be returned until the following business day.  We are closed weekends and major holidays. You have access to a nurse at all times for urgent questions. Please call the main number to the clinic [phone] and follow the prompts.  For any non-urgent questions, you may also contact your provider using MyChart. We now offer e-Visits for anyone 64 and older to request care online for non-urgent symptoms. For details visit [email or web address].   Also download the MyChart app! Go to the app store, search MyChart, open the app, select [place], and log in with your MyChart username and password.   Mitomycin  Injection What is this medication? MITOMYCIN  (mye toe MYE sin) treats stomach cancer and pancreatic cancer. It works by slowing down the growth of cancer cells. This medicine may be used for other purposes; ask your health care provider or pharmacist if you have questions. COMMON BRAND NAME(S): Mutamycin  What should I tell my care team before I take this medication? They need to know if you have any of these conditions: Bleeding disorders Infection, such as chickenpox, cold sores, herpes Low blood counts, such as low white cells, platelets, red blood cells Kidney disease An  unusual or allergic reaction to mitomycin , other medications, foods, dyes, or preservatives Pregnant or trying to get pregnant Breastfeeding How should I use this medication? This medication is injected into a vein. It is given by your care team in a hospital or clinic setting. Talk to your care team about the use of this  medication in children. Special care may be needed. Overdosage: If you think you have taken too much of this medicine contact a poison control center or emergency room at once. NOTE: This medicine is only for you. Do not share this medicine with others. What if I miss a dose? Keep appointments for follow-up doses. It is important not to miss your dose. Call your care team if you are unable to keep an appointment. What may interact with this medication? Interactions are not expected. This list may not describe all possible interactions. Give your health care provider a list of all the medicines, herbs, non-prescription drugs, or dietary supplements you use. Also tell them if you smoke, drink alcohol, or use illegal drugs. Some items may interact with your medicine. What should I watch for while using this medication? Your condition will be monitored carefully while you are receiving this medication. You may need blood work while taking this medication. This medication may make you feel generally unwell. This is not uncommon as chemotherapy can affect healthy cells as well as cancer cells. Report any side effects. Continue your course of treatment even though you feel ill unless your care team tells you to stop. This medication may increase your risk of getting an infection. Call your care team for advice if you get a fever, chills, sore throat, or other symptoms of a cold or flu. Do not treat yourself. Try to avoid being around people who are sick. Avoid taking medications that contain aspirin, acetaminophen , ibuprofen , naproxen , or ketoprofen unless instructed by your care team. These medications may hide a fever. This medication may increase your risk to bruise or bleed. Call your care team if you notice any unusual bleeding. Be careful brushing or flossing your teeth or using a toothpick because you may get an infection or bleed more easily. If you have any dental work done, tell your dentist you are  receiving this medication. Talk to your care team if you may be pregnant. Serious birth defects can occur if you take this medication during pregnancy. Contraception is recommended while taking this medication. Your care team can help you find the option that works for you. Do not breastfeed while taking this medication. What side effects may I notice from receiving this medication? Side effects that you should report to your care team as soon as possible: Allergic reactions--skin rash, itching, hives, swelling of the face, lips, tongue, or throat Dry cough, shortness of breath or trouble breathing Infection--fever, chills, cough, sore throat, wounds that don't heal, pain or trouble when passing urine, general feeling of discomfort or being unwell Kidney injury--decrease in the amount of urine, swelling of the ankles, hands, or feet Low red blood cell level--unusual weakness or fatigue, dizziness, headache, trouble breathing Stomach pain, bloody diarrhea, pale skin, unusual weakness or fatigue, decrease in the amount of urine, which may be signs of hemolytic uremic syndrome Unusual bruising or bleeding Side effects that usually do not require medical attention (report these to your care team if they continue or are bothersome): Diarrhea Hair loss Loss of appetite with weight loss Nausea Pain, redness, or swelling with sores inside the mouth or throat This  list may not describe all possible side effects. Call your doctor for medical advice about side effects. You may report side effects to FDA at 1-800-FDA-1088. Where should I keep my medication? This medication is given in a hospital or clinic. It will not be stored at home. NOTE: This sheet is a summary. It may not cover all possible information. If you have questions about this medicine, talk to your doctor, pharmacist, or health care provider.  2024 [employer]vier/Gold Standard (2022-03-24 00:00:00)

## 2024-08-29 NOTE — Telephone Encounter (Signed)
 The patient says she got up with a very sore throat and her right eye swelling and she took the first medicine today I am assuming it may be xeloda med.  She also wants to know if she should cancel radiation today.  The last thing she says is that because she come in earlier to see Dr. Babara.  The patient has an appointment for radiation at 2:00 and then get labs and then see Dr. Layvonne afternoon.

## 2024-08-29 NOTE — Assessment & Plan Note (Signed)
 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 Continue Lovenox  1 mg/kg twice daily-80 mg every 12 hours.  Previously anti Xa level was checked and indicated patient is in therapeutic/supratherapeutic range.  Will repeat levels at the next visit.  I suspect the right upper extremity swelling, right facial swelling are likely secondary to thrombosis, rather than allergic reaction to Xeloda. I will refer patient to establish care for vascular surgeon for evaluation of feasibility of thrombectomy

## 2024-08-29 NOTE — Assessment & Plan Note (Signed)
 Continue Lovenox 1 mg/kg twice daily

## 2024-08-29 NOTE — Assessment & Plan Note (Addendum)
 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  Per IR, right inguinal lymph node is not enlarged.  Previously on concurrent chemotherapy 5-FU and mitomycin  C with Radiation. S/p D1 5-FU and Mitomycin -C. Patient is on radiation.  Medi port was occluded and has been removed. 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  Patient has PICC line placement.  This is a complicated case.  Continuous 5-FU infusion via PICC line is not considered safe.  Recommend switch to Xeloda 1500 mg twice daily on days of radiation.  Proceed with Mitomycin -C today.

## 2024-08-29 NOTE — Assessment & Plan Note (Signed)
 Secondary to chemotherapy and radiation.  Recommend Imodium as needed.

## 2024-08-29 NOTE — Assessment & Plan Note (Signed)
 Multifactorial, possible secondary to chemotherapy, fatty liver disease.  Bilirubin level is normal. Recommend patient to hold off using Tylenol .  No need for dose adjustment for Mitomycin -C and Xeloda.

## 2024-08-29 NOTE — Assessment & Plan Note (Signed)
 Resolved  Encourage oral hydration

## 2024-08-30 ENCOUNTER — Other Ambulatory Visit: Payer: Self-pay

## 2024-08-30 ENCOUNTER — Ambulatory Visit
Admission: RE | Admit: 2024-08-30 | Discharge: 2024-08-30 | Disposition: A | Source: Ambulatory Visit | Attending: Radiation Oncology | Admitting: Radiation Oncology

## 2024-08-30 LAB — RAD ONC ARIA SESSION SUMMARY
Course Elapsed Days: 29
Plan Fractions Treated to Date: 22
Plan Prescribed Dose Per Fraction: 1.8 Gy
Plan Total Fractions Prescribed: 30
Plan Total Prescribed Dose: 54 Gy
Reference Point Dosage Given to Date: 39.6 Gy
Reference Point Session Dosage Given: 1.8 Gy
Session Number: 22

## 2024-08-30 NOTE — Telephone Encounter (Signed)
 Pt was seen in clinic

## 2024-08-31 ENCOUNTER — Encounter: Payer: Self-pay | Admitting: Emergency Medicine

## 2024-08-31 ENCOUNTER — Other Ambulatory Visit: Payer: Self-pay

## 2024-08-31 ENCOUNTER — Emergency Department

## 2024-08-31 ENCOUNTER — Inpatient Hospital Stay
Admission: EM | Admit: 2024-08-31 | Discharge: 2024-09-06 | DRG: 871 | Disposition: A | Discharge status: Home / Self Care | Attending: Internal Medicine | Admitting: Internal Medicine

## 2024-08-31 DIAGNOSIS — Z17421 Hormone receptor negative with human epidermal growth factor receptor 2 negative status: Secondary | ICD-10-CM

## 2024-08-31 DIAGNOSIS — F418 Other specified anxiety disorders: Secondary | ICD-10-CM | POA: Diagnosis present

## 2024-08-31 DIAGNOSIS — F13239 Sedative, hypnotic or anxiolytic dependence with withdrawal, unspecified: Secondary | ICD-10-CM | POA: Diagnosis present

## 2024-08-31 DIAGNOSIS — J45909 Unspecified asthma, uncomplicated: Secondary | ICD-10-CM | POA: Diagnosis present

## 2024-08-31 DIAGNOSIS — Z7901 Long term (current) use of anticoagulants: Secondary | ICD-10-CM

## 2024-08-31 DIAGNOSIS — F32A Depression, unspecified: Secondary | ICD-10-CM | POA: Diagnosis present

## 2024-08-31 DIAGNOSIS — Z86718 Personal history of other venous thrombosis and embolism: Secondary | ICD-10-CM

## 2024-08-31 DIAGNOSIS — Y842 Radiological procedure and radiotherapy as the cause of abnormal reaction of the patient, or of later complication, without mention of misadventure at the time of the procedure: Secondary | ICD-10-CM | POA: Diagnosis present

## 2024-08-31 DIAGNOSIS — C210 Malignant neoplasm of anus, unspecified: Secondary | ICD-10-CM | POA: Diagnosis present

## 2024-08-31 DIAGNOSIS — J4520 Mild intermittent asthma, uncomplicated: Secondary | ICD-10-CM | POA: Diagnosis not present

## 2024-08-31 DIAGNOSIS — K627 Radiation proctitis: Secondary | ICD-10-CM | POA: Diagnosis present

## 2024-08-31 DIAGNOSIS — N3000 Acute cystitis without hematuria: Secondary | ICD-10-CM

## 2024-08-31 DIAGNOSIS — D539 Nutritional anemia, unspecified: Secondary | ICD-10-CM | POA: Diagnosis present

## 2024-08-31 DIAGNOSIS — A419 Sepsis, unspecified organism: Principal | ICD-10-CM | POA: Diagnosis present

## 2024-08-31 DIAGNOSIS — Z85048 Personal history of other malignant neoplasm of rectum, rectosigmoid junction, and anus: Secondary | ICD-10-CM

## 2024-08-31 DIAGNOSIS — Z801 Family history of malignant neoplasm of trachea, bronchus and lung: Secondary | ICD-10-CM

## 2024-08-31 DIAGNOSIS — I2699 Other pulmonary embolism without acute cor pulmonale: Secondary | ICD-10-CM | POA: Diagnosis present

## 2024-08-31 DIAGNOSIS — E8770 Fluid overload, unspecified: Secondary | ICD-10-CM | POA: Diagnosis present

## 2024-08-31 DIAGNOSIS — F419 Anxiety disorder, unspecified: Secondary | ICD-10-CM | POA: Diagnosis present

## 2024-08-31 DIAGNOSIS — J189 Pneumonia, unspecified organism: Secondary | ICD-10-CM | POA: Diagnosis present

## 2024-08-31 DIAGNOSIS — Z6841 Body Mass Index (BMI) 40.0 and over, adult: Secondary | ICD-10-CM

## 2024-08-31 DIAGNOSIS — Z7951 Long term (current) use of inhaled steroids: Secondary | ICD-10-CM

## 2024-08-31 DIAGNOSIS — Z8249 Family history of ischemic heart disease and other diseases of the circulatory system: Secondary | ICD-10-CM

## 2024-08-31 DIAGNOSIS — I82C11 Acute embolism and thrombosis of right internal jugular vein: Secondary | ICD-10-CM | POA: Diagnosis present

## 2024-08-31 DIAGNOSIS — N63 Unspecified lump in unspecified breast: Secondary | ICD-10-CM | POA: Diagnosis present

## 2024-08-31 DIAGNOSIS — F4310 Post-traumatic stress disorder, unspecified: Secondary | ICD-10-CM | POA: Diagnosis present

## 2024-08-31 DIAGNOSIS — Z833 Family history of diabetes mellitus: Secondary | ICD-10-CM

## 2024-08-31 DIAGNOSIS — G894 Chronic pain syndrome: Secondary | ICD-10-CM | POA: Diagnosis present

## 2024-08-31 DIAGNOSIS — Z86711 Personal history of pulmonary embolism: Secondary | ICD-10-CM

## 2024-08-31 DIAGNOSIS — M797 Fibromyalgia: Secondary | ICD-10-CM | POA: Diagnosis not present

## 2024-08-31 DIAGNOSIS — E66813 Obesity, class 3: Secondary | ICD-10-CM | POA: Diagnosis present

## 2024-08-31 DIAGNOSIS — D849 Immunodeficiency, unspecified: Secondary | ICD-10-CM | POA: Diagnosis present

## 2024-08-31 DIAGNOSIS — N390 Urinary tract infection, site not specified: Principal | ICD-10-CM | POA: Diagnosis present

## 2024-08-31 DIAGNOSIS — N903 Dysplasia of vulva, unspecified: Secondary | ICD-10-CM | POA: Diagnosis present

## 2024-08-31 DIAGNOSIS — K5900 Constipation, unspecified: Secondary | ICD-10-CM | POA: Diagnosis present

## 2024-08-31 DIAGNOSIS — Z87412 Personal history of vulvar dysplasia: Secondary | ICD-10-CM

## 2024-08-31 DIAGNOSIS — D071 Carcinoma in situ of vulva: Secondary | ICD-10-CM | POA: Diagnosis present

## 2024-08-31 DIAGNOSIS — C50919 Malignant neoplasm of unspecified site of unspecified female breast: Secondary | ICD-10-CM | POA: Diagnosis present

## 2024-08-31 DIAGNOSIS — Z800 Family history of malignant neoplasm of digestive organs: Secondary | ICD-10-CM

## 2024-08-31 DIAGNOSIS — Z87442 Personal history of urinary calculi: Secondary | ICD-10-CM

## 2024-08-31 DIAGNOSIS — M329 Systemic lupus erythematosus, unspecified: Secondary | ICD-10-CM | POA: Diagnosis present

## 2024-08-31 DIAGNOSIS — Z888 Allergy status to other drugs, medicaments and biological substances status: Secondary | ICD-10-CM

## 2024-08-31 LAB — BASIC METABOLIC PANEL WITH GFR
Anion gap: 14 (ref 5–15)
BUN: 6 mg/dL (ref 6–20)
CO2: 24 mmol/L (ref 22–32)
Calcium: 8.4 mg/dL — ABNORMAL LOW (ref 8.9–10.3)
Chloride: 101 mmol/L (ref 98–111)
Creatinine, Ser: 0.72 mg/dL (ref 0.44–1.00)
GFR, Estimated: >60 mL/min (ref >60)
Glucose, Bld: 98 mg/dL (ref 70–99)
Potassium: 3.5 mmol/L (ref 3.5–5.1)
Sodium: 139 mmol/L (ref 135–145)

## 2024-08-31 LAB — URINALYSIS, ROUTINE W REFLEX MICROSCOPIC
Bilirubin Urine: NEGATIVE
Glucose, UA: NEGATIVE mg/dL
Hgb urine dipstick: NEGATIVE
Ketones, ur: NEGATIVE mg/dL
Nitrite: NEGATIVE
Protein, ur: 100 mg/dL — AB
RBC / HPF: >50 RBC/hpf (ref 0–5)
Specific Gravity, Urine: 1.027 (ref 1.005–1.030)
WBC, UA: >50 WBC/hpf (ref 0–5)
pH: 5 (ref 5.0–8.0)

## 2024-08-31 LAB — CBC
HCT: 31.7 % — ABNORMAL LOW (ref 36.0–46.0)
Hemoglobin: 10 g/dL — ABNORMAL LOW (ref 12.0–15.0)
MCH: 31.3 pg (ref 26.0–34.0)
MCHC: 31.5 g/dL (ref 30.0–36.0)
MCV: 99.4 fL (ref 80.0–100.0)
Platelets: 250 K/uL (ref 150–400)
RBC: 3.19 MIL/uL — ABNORMAL LOW (ref 3.87–5.11)
RDW: 17.3 % — ABNORMAL HIGH (ref 11.5–15.5)
WBC: 6.5 K/uL (ref 4.0–10.5)
nRBC: 0 % (ref 0.0–0.2)

## 2024-08-31 LAB — LACTIC ACID, PLASMA: Lactic Acid, Venous: 1.1 mmol/L (ref 0.5–1.9)

## 2024-08-31 MED ORDER — BUSPIRONE HCL 10 MG PO TABS
5.0000 mg | ORAL_TABLET | Freq: Two times a day (BID) | ORAL | Status: DC
  Administered 2024-08-31 – 2024-09-06 (×12): 5 mg via ORAL
  Filled 2024-08-31 (×12): qty 1

## 2024-08-31 MED ORDER — MONTELUKAST SODIUM 10 MG PO TABS: 10.0000 mg | ORAL_TABLET | Freq: Every day | ORAL | Status: DC | PRN

## 2024-08-31 MED ORDER — OXYCODONE HCL 5 MG PO TABS
5.0000 mg | ORAL_TABLET | Freq: Four times a day (QID) | ORAL | Status: DC | PRN
  Administered 2024-08-31 – 2024-09-05 (×8): 5 mg via ORAL
  Filled 2024-08-31 (×8): qty 1

## 2024-08-31 MED ORDER — SODIUM CHLORIDE 0.9 % IV BOLUS
1000.0000 mL | Freq: Once | INTRAVENOUS | Status: AC
  Administered 2024-08-31: 1000 mL via INTRAVENOUS

## 2024-08-31 MED ORDER — FERROUS SULFATE 325 (65 FE) MG PO TABS
325.0000 mg | ORAL_TABLET | Freq: Every day | ORAL | Status: DC
  Administered 2024-09-01 – 2024-09-06 (×6): 325 mg via ORAL
  Filled 2024-08-31 (×6): qty 1

## 2024-08-31 MED ORDER — SODIUM CHLORIDE 0.9 % IV BOLUS: 500.0000 mL | Freq: Once | INTRAVENOUS | Status: DC

## 2024-08-31 MED ORDER — ENOXAPARIN SODIUM 80 MG/0.8ML IJ SOSY
80.0000 mg | PREFILLED_SYRINGE | Freq: Two times a day (BID) | INTRAMUSCULAR | Status: DC
  Administered 2024-08-31 – 2024-09-06 (×12): 80 mg via SUBCUTANEOUS
  Filled 2024-08-31 (×14): qty 0.8

## 2024-08-31 MED ORDER — PANTOPRAZOLE SODIUM 40 MG PO TBEC
40.0000 mg | DELAYED_RELEASE_TABLET | Freq: Two times a day (BID) | ORAL | Status: DC
  Administered 2024-08-31 – 2024-09-06 (×12): 40 mg via ORAL
  Filled 2024-08-31 (×12): qty 1

## 2024-08-31 MED ORDER — DM-GUAIFENESIN ER 30-600 MG PO TB12
1.0000 | ORAL_TABLET | Freq: Two times a day (BID) | ORAL | Status: DC | PRN
  Administered 2024-09-05 – 2024-09-06 (×2): 1 via ORAL
  Filled 2024-08-31 (×3): qty 1

## 2024-08-31 MED ORDER — LOPERAMIDE HCL 2 MG PO CAPS: 2.0000 mg | ORAL_CAPSULE | Freq: Four times a day (QID) | ORAL | Status: DC | PRN

## 2024-08-31 MED ORDER — SODIUM CHLORIDE 0.9 % IV SOLN
2.0000 g | Freq: Once | INTRAVENOUS | Status: AC
  Administered 2024-08-31: 2 g via INTRAVENOUS
  Filled 2024-08-31: qty 12.5

## 2024-08-31 MED ORDER — DULOXETINE HCL 30 MG PO CPEP
30.0000 mg | ORAL_CAPSULE | Freq: Every day | ORAL | Status: DC
  Administered 2024-08-31 – 2024-09-05 (×6): 30 mg via ORAL
  Filled 2024-08-31 (×6): qty 1

## 2024-08-31 MED ORDER — GABAPENTIN 300 MG PO CAPS
600.0000 mg | ORAL_CAPSULE | Freq: Three times a day (TID) | ORAL | Status: DC
  Administered 2024-08-31 – 2024-09-06 (×17): 600 mg via ORAL
  Filled 2024-08-31 (×17): qty 2

## 2024-08-31 MED ORDER — OXYCODONE HCL 5 MG PO TABS
5.0000 mg | ORAL_TABLET | Freq: Once | ORAL | Status: AC
  Administered 2024-08-31: 5 mg via ORAL
  Filled 2024-08-31: qty 1

## 2024-08-31 MED ORDER — SODIUM CHLORIDE 0.9 % IV SOLN: INTRAVENOUS | Status: DC

## 2024-08-31 MED ORDER — ONDANSETRON HCL 4 MG/2ML IJ SOLN
4.0000 mg | Freq: Three times a day (TID) | INTRAMUSCULAR | Status: DC | PRN
  Administered 2024-08-31: 4 mg via INTRAVENOUS
  Filled 2024-08-31: qty 2

## 2024-08-31 MED ORDER — SODIUM CHLORIDE 0.9 % IV SOLN: 2.0000 g | INTRAVENOUS | Status: DC

## 2024-08-31 MED ORDER — TRAZODONE HCL 50 MG PO TABS
50.0000 mg | ORAL_TABLET | Freq: Every evening | ORAL | Status: DC | PRN
  Administered 2024-08-31 – 2024-09-05 (×4): 50 mg via ORAL
  Filled 2024-08-31 (×4): qty 1

## 2024-08-31 MED ORDER — LACTATED RINGERS IV BOLUS
1000.0000 mL | Freq: Once | INTRAVENOUS | Status: AC
  Administered 2024-08-31: 1000 mL via INTRAVENOUS

## 2024-08-31 MED ORDER — ADULT MULTIVITAMIN W/MINERALS CH
1.0000 | ORAL_TABLET | Freq: Every day | ORAL | Status: DC
  Administered 2024-09-01 – 2024-09-06 (×6): 1 via ORAL
  Filled 2024-08-31 (×6): qty 1

## 2024-08-31 MED ORDER — FENTANYL 25 MCG/HR TD PT72
1.0000 | MEDICATED_PATCH | TRANSDERMAL | Status: DC
  Administered 2024-09-01 – 2024-09-04 (×2): 1 via TRANSDERMAL
  Filled 2024-08-31 (×2): qty 1

## 2024-08-31 MED ORDER — BUDESONIDE 0.25 MG/2ML IN SUSP
0.2500 mg | Freq: Two times a day (BID) | RESPIRATORY_TRACT | Status: DC
  Administered 2024-09-01 – 2024-09-06 (×9): 0.25 mg via RESPIRATORY_TRACT
  Filled 2024-08-31 (×11): qty 2

## 2024-08-31 MED ORDER — ACETAMINOPHEN 325 MG PO TABS
650.0000 mg | ORAL_TABLET | Freq: Four times a day (QID) | ORAL | Status: DC | PRN
  Administered 2024-09-03 – 2024-09-04 (×3): 650 mg via ORAL
  Filled 2024-08-31 (×3): qty 2

## 2024-08-31 MED ORDER — LORAZEPAM 0.5 MG PO TABS
0.5000 mg | ORAL_TABLET | Freq: Two times a day (BID) | ORAL | Status: DC | PRN
  Administered 2024-09-05: 0.5 mg via ORAL
  Filled 2024-08-31: qty 1

## 2024-08-31 MED ORDER — OLANZAPINE 5 MG PO TABS
5.0000 mg | ORAL_TABLET | Freq: Every day | ORAL | Status: DC
  Administered 2024-08-31 – 2024-09-05 (×6): 5 mg via ORAL
  Filled 2024-08-31 (×6): qty 1

## 2024-08-31 MED ORDER — ALBUTEROL SULFATE (2.5 MG/3ML) 0.083% IN NEBU: 2.5000 mg | INHALATION_SOLUTION | RESPIRATORY_TRACT | Status: DC | PRN

## 2024-08-31 NOTE — ED Notes (Signed)
 IV team at bedside to redress PICC line.

## 2024-08-31 NOTE — H&P (Signed)
 History and Physical    Monica Gonzalez Monica Gonzalez:[id number] DOB: [birth year] DOA: 08/31/2024  Referring MD/NP/PA:   PCP: Epifanio Alm SQUIBB, MD   Patient coming from:  The patient is coming from home.     Chief Complaint: Dysuria and suprapubic abdominal pain  HPI: Monica Gonzalez is a 48 y.o. 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  Patient states that her symptoms started yesterday, including dysuria, burning with urination, suprapubic abdominal pain.  No urinary frequency or hematuria.  Her suprapubic abdominal pain is mild, aching, constant, nonradiating, not aggravated or alleviated by any known factors.  She has chronic loose stool bowel movement which has not changed.  No nausea, vomiting. Patient does not have chest pain, cough, SOB. No fever or chills.  Patient has PICC line in left arm.  Data reviewed independently and ED Course: pt was found to have positive UA (turbid appearance, more than 1 leukocyte, many bacteria, WBC> 50), WBC 6.5, GFR > 60, lactic acid 1.1.  Temperature normal, blood pressure 100/43, 95/54, heart rate 110-120s, RR 20, oxygen saturation 95% on room air.  Patient is placed in PCU for observation.   CXR: 1. Low lung volumes with no definite acute cardiopulmonary process. 2. Apparent cardiomegaly on AP portable; consider repeat PA and lateral chest radiographs for confirmation.   EKG: I have personally reviewed.  Sinus rhythm, QTc 421, LAE, LAD, poor R progression, low voltage.   Review of Systems:   General: no fevers, chills, no body weight gain, has fatigue HEENT:  no blurry vision, hearing changes or sore throat Respiratory: no dyspnea, coughing, wheezing CV: no chest pain, no palpitations GI: no nausea, vomiting, has suprapubic abdominal pain, diarrhea, no constipation GU: has dysuria, burning on urination, no increased urinary frequency, hematuria  Ext: no leg edema Neuro: no unilateral weakness, numbness, or tingling, no vision change or hearing loss Skin: no rash, no skin tear. MSK: No muscle spasm, no deformity, no limitation of range of movement in spin Heme: No easy bruising.  Travel history: No recent long distant travel.   Allergy:  Allergies  Allergen Reactions   Prednisone Shortness Of Breath and Swelling    Patient has taken methylprednisolone  (Depo-Medrol ) and dexamethasone  (Decadron ) without any documented side effects or adverse reactions. She describes that she used to take prednisone due to her bronchial asthma and respiratory problems, but later on she received some prednisone which into locations caused her to have significant swelling and shortness of breath. For that reason, she was told to avoid the use of prednisone.   Silicone Hives and Itching    Past Medical History:  Diagnosis Date   Anemia    Anginal pain    Anxiety    Arthritis    Asthma    Cancer (HCC)    rectal cancer, breast cancer 2025   Chronic pain syndrome 05/21/2023   DDD (degenerative disc disease), cervical 05/22/2023   Deep venous thrombosis (HCC)    Depression    Dyspnea    Fibromyalgia    GERD (gastroesophageal reflux disease)    Headache    Heart murmur    History of hiatal hernia    History of kidney stones  Lupus    Malignant neoplasm of upper-outer quadrant of right female breast, unspecified estrogen receptor status (HCC) 2025   Pneumonia    PTSD (post-traumatic stress disorder)    Skin candidiasis 05/29/2024   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    Past Surgical History:  Procedure Laterality Date    BREAST BIOPSY Right 06/18/2024   US  RT BREAST BX W LOC DEV 1ST LESION IMG BX SPEC US  GUIDE 06/18/2024 [facility]   BREAST BIOPSY Right 06/18/2024   US  RT BREAST BX W LOC DEV EA ADD LESION IMG BX SPEC US  GUIDE 06/18/2024 [facility]   BREAST BIOPSY Right 06/18/2024   US  RT BREAST BX W LOC DEV EA ADD LESION IMG BX SPEC US  GUIDE 06/18/2024 [facility]   BREAST BIOPSY Right 07/05/2024   US  RT BREAST SAVI/RF TAG 1ST LESION US  GUIDE 07/05/2024 [facility]   BREAST BIOPSY Right 07/05/2024   US  RT BREAST SAVI/RF TAG EA ADD'L LESION US  GUIDE 07/05/2024 [facility]   BREAST LUMPECTOMY Right 07/25/2024   Procedure: BREAST LUMPECTOMY;  Surgeon: Tye Millet, DO;  Location: [facility];  Service: General;  Laterality: Right;   BREAST LUMPECTOMY WITH RADIO FREQUENCY LOCALIZER Right 07/08/2024   Procedure: BREAST LUMPECTOMY WITH RADIO FREQUENCY LOCALIZER;  Surgeon: Tye Millet, DO;  Location: [facility];  Service: General;  Laterality: Right;   COLONOSCOPY WITH PROPOFOL  N/A 02/17/2023   Procedure: COLONOSCOPY WITH PROPOFOL ;  Surgeon: Maryruth Ole DASEN, MD;  Location: [facility];  Service: Endoscopy;  Laterality: N/A;   ESOPHAGOGASTRODUODENOSCOPY (EGD) WITH PROPOFOL  N/A 02/17/2023   Procedure: ESOPHAGOGASTRODUODENOSCOPY (EGD) WITH PROPOFOL ;  Surgeon: Maryruth Ole DASEN, MD;  Location: [facility];  Service: Endoscopy;  Laterality: N/A;   EXTRACORPOREAL SHOCK WAVE LITHOTRIPSY     IR CV LINE INJECTION  08/21/2024   IR REMOVAL TUN ACCESS W/ PORT W/O FL MOD SED  08/28/2024   PORTACATH PLACEMENT Right 07/08/2024   Procedure: INSERTION, TUNNELED CENTRAL VENOUS DEVICE, WITH PORT;  Surgeon: Tye Millet, DO;  Location: [facility];  Service: General;  Laterality: Right;   RECTAL EXAM UNDER ANESTHESIA N/A 05/16/2024   Procedure: EXAM UNDER ANESTHESIA, RECTUM, INCISIONAL BIOPSY OF ANAL MASS;  Surgeon: Tye Millet, DO;  Location: [facility];  Service: General;  Laterality: N/A;   SENTINEL NODE BIOPSY  Right 07/08/2024   Procedure: BIOPSY, LYMPH NODE, SENTINEL;  Surgeon: Tye Millet, DO;  Location: [facility];  Service: General;  Laterality: Right;   TONSILLECTOMY     TUBAL LIGATION     VULVECTOMY N/A 07/29/2020   Procedure: WIDE EXCISION VULVECTOMY;  Surgeon: Mancil Barter, MD;  Location: [facility];  Service: Gynecology;  Laterality: N/A;    Social History:  reports that she has never smoked. She has never used smokeless tobacco. She reports that she does not currently use alcohol. She reports that she does not use drugs.  Family History:  Family History  Problem Relation Age of Onset   Diabetes Mother    Congestive Heart Failure Mother    Colon cancer Father 42   Lupus Maternal Grandmother    Cancer Maternal Grandfather        either prostate or colon   Colon cancer Paternal Grandfather    Lung cancer Paternal Grandfather      Prior to Admission medications   Medication Sig Start Date End Date Taking? Authorizing Provider  albuterol  (PROVENTIL ) (2.5 MG/3ML) 0.083% nebulizer solution Take 2.5 mg by nebulization every 6 (six) hours as needed for shortness of breath or wheezing. 12/21/22 05/16/27  [provider]  albuterol  (VENTOLIN  HFA) 108 (90 Base) MCG/ACT inhaler Inhale 2 puffs into the lungs every 4 (four) hours as needed for wheezing or shortness of breath. 07/01/24   Clarine Ozell LABOR, MD  ascorbic acid (VITAMIN C) 500 MG tablet Take 500 mg by mouth daily.    [provider]  B Complex-C (SUPER B COMPLEX PO) Take by mouth daily.    [provider]  benzonatate  (TESSALON ) 200 MG capsule Take 1 capsule (200 mg total) by mouth 3 (three) times daily as needed for cough. 07/04/24   Tamea Dedra CROME, MD  BIOTIN PO Take by mouth daily.    [provider]  budesonide  (PULMICORT ) 0.25 MG/2ML nebulizer solution Take 2 mLs (0.25 mg total) by nebulization 2 (two) times daily. 08/08/24   Tamea Dedra CROME, MD  busPIRone  (BUSPAR ) 5 MG tablet Take 5 mg by mouth  2 (two) times daily. 01/31/24 01/30/25  [provider]  capecitabine (XELODA) 500 MG tablet Take 3 tablets (1,500 mg total) by mouth 2 (two) times daily after a meal. Take within 30 minutes after meals. Take only on days when you have radiation. Start on 08/29/24. 08/22/24   Babara Call, MD  cholecalciferol (VITAMIN D3) 25 MCG (1000 UNIT) tablet Take 1,000 Units by mouth daily.    [provider]  clonazePAM (KLONOPIN) 0.5 MG tablet Take 0.5 mg by mouth at bedtime as needed (anxiety/sleep). 04/05/23   [provider]  diphenoxylate-atropine (LOMOTIL) 2.5-0.025 MG tablet Take 1-2 tablets by mouth 4 (four) times daily as needed for diarrhea or loose stools. 08/22/24   Dasie Tinnie MATSU, NP  DULoxetine  (CYMBALTA ) 30 MG capsule Take 30 mg by mouth at bedtime. 05/10/24 05/10/25  [provider]  enoxaparin  (LOVENOX ) 80 MG/0.8ML injection Inject 0.8 mLs (80 mg total) into the skin every 12 (twelve) hours. 08/15/24   Babara Call, MD  esomeprazole  (NEXIUM ) 40 MG capsule Take 1 capsule (40 mg total) by mouth 2 (two) times daily before a meal. 08/08/24   Tamea Dedra CROME, MD  fentaNYL  (DURAGESIC ) 25 MCG/HR Place 1 patch onto the skin every 3 (three) days. 08/25/24   Babara Call, MD  ferrous sulfate 325 (65 FE) MG tablet Take 325 mg by mouth daily.  Patient not taking: Reported on 08/29/2024 07/01/20 05/16/27  [provider]  gabapentin  (NEURONTIN ) 300 MG capsule Take 600 mg by mouth 2 (two) times daily. 04/14/23 05/16/27  [provider]  guaiFENesin -codeine  100-10 MG/5ML syrup Take 5 mLs by mouth every 6 (six) hours as needed for cough. 08/08/24   Tamea Dedra CROME, MD  hydrocortisone (ANUSOL-HC) 2.5 % rectal cream Place 1 Application rectally 2 (two) times daily. 08/22/24   Dasie Tinnie MATSU, NP  ipratropium (ATROVENT  HFA) 17 MCG/ACT inhaler Inhale 2 puffs into the lungs every 6 (six) hours as needed for wheezing (COUGH). 07/04/24 07/04/25  Tamea Dedra CROME, MD   ipratropium-albuterol  (DUONEB) 0.5-2.5 (3) MG/3ML SOLN Take 3 mLs by nebulization 4 (four) times daily. 08/08/24   Tamea Dedra CROME, MD  lidocaine  (LIDODERM ) 5 % 1 patch daily. 06/13/24   [provider]  lidocaine  (XYLOCAINE ) 2 % solution Use as directed 15 mLs in the mouth or throat every 4 (four) hours as needed for mouth pain. 08/08/24   Borders, Joshua R, NP  linaclotide (LINZESS) 145 MCG CAPS capsule Take 145 mcg by mouth daily before breakfast.    [provider]  loperamide (IMODIUM A-D) 2 MG tablet Take 2 mg by mouth 4 (  four) times daily as needed for diarrhea or loose stools.    [provider]  LORazepam (ATIVAN) 0.5 MG tablet Take 1 tablet (0.5 mg total) by mouth every 12 (twelve) hours as needed for anxiety (nausea). 08/15/24   Babara Call, MD  magic mouthwash (multi-ingredient) oral suspension Swish/Spit 5-10 mLs by mouth 4 (four) times daily as needed for mouth pain. 08/06/24   Babara Call, MD  montelukast  (SINGULAIR ) 10 MG tablet Take 1 tablet (10 mg total) by mouth See admin instructions. Take 1 tablet on the day prior to chemotherapy and take 1 tablet daily for 2 days after chemotherapy. 07/31/24   Babara Call, MD  Multiple Vitamin (MULTIVITAMIN WITH MINERALS) TABS tablet Take 1 tablet by mouth daily.    [provider]  naproxen  (EC NAPROSYN ) 500 MG EC tablet Take 1 tablet (500 mg total) by mouth 2 (two) times daily with a meal. Patient not taking: Reported on 08/29/2024 06/07/24   Janit Kast, PA-C  nystatin  (MYCOSTATIN ) 100000 UNIT/ML suspension Take 5 mLs (500,000 Units total) by mouth 4 (four) times daily. Swish and swallow 08/06/24   Babara Call, MD  nystatin  (MYCOSTATIN /NYSTOP ) powder Apply 1 Application topically 2 (two) times daily. 05/29/24   Babara Call, MD  OLANZapine (ZYPREXA) 5 MG tablet Take 1 tablet (5 mg total) by mouth at bedtime. 08/22/24   Dasie Tinnie MATSU, NP  oxyCODONE  (OXY IR/ROXICODONE ) 5 MG immediate release tablet Take 1 tablet (5 mg total)  by mouth every 6 (six) hours as needed for severe pain (pain score 7-10) or breakthrough pain. 08/25/24   Babara Call, MD  potassium chloride  SA (KLOR-CON  M) 20 MEQ tablet Take 1 tablet (20 mEq total) by mouth daily. 08/22/24   Dasie Tinnie MATSU, NP  promethazine -dextromethorphan  (PROMETHAZINE -DM) 6.25-15 MG/5ML syrup Take 5 mLs by mouth 4 (four) times daily as needed for cough. 08/15/24   Babara Call, MD  sucralfate  (CARAFATE ) 1 g tablet Take 1 tablet (1 g total) by mouth 4 (four) times daily -  with meals and at bedtime. Dissolve in water  08/08/24   Borders, Fonda SAUNDERS, NP  traZODone  (DESYREL ) 50 MG tablet Take 50 mg by mouth at bedtime. 07/04/23 08/29/24  [provider]  Turmeric (QC TUMERIC COMPLEX PO) Take by mouth daily.    [provider]    Physical Exam: Vitals:   08/31/24 1824 08/31/24 1932 08/31/24 2000 08/31/24 2137  BP:  (!) 99/54 108/60 (!) 101/57  Pulse:  (!) 117 (!) 121 (!) 111  Resp:  (!) 28 (!) 23 18  Temp: 98.6 F (37 C)     TempSrc: Oral     SpO2:  97% 98% 96%  Weight:      Height:       General: Not in acute distress HEENT:       Eyes: PERRL, EOMI, no jaundice       ENT: No discharge from the ears and nose, no pharynx injection, no tonsillar enlargement.        Neck: No JVD, no bruit, no mass felt. Heme: No neck lymph node enlargement. Cardiac: S1/S2, RRR, No murmurs, No gallops or rubs. Respiratory: No rales, wheezing, rhonchi or rubs. GI: Soft, nondistended, has mild tenderness in suprapubic area, no rebound pain, no organomegaly, BS present. GU: No hematuria Ext: No pitting leg edema bilaterally. 1+DP/PT pulse bilaterally. Musculoskeletal: No joint deformities, No joint redness or warmth, no limitation of ROM in spin. Skin: No rashes.  Neuro: Alert, oriented X3, cranial nerves II-XII grossly  intact, moves all extremities normally.  Psych: Patient is not psychotic, no suicidal or hemocidal ideation.  Labs on Admission: I have personally reviewed  following labs and imaging studies  CBC: Recent Labs  Lab 08/29/24 1437 08/31/24 1416  WBC 4.8 6.5  NEUTROABS 3.5  --   HGB 10.1* 10.0*  HCT 31.1* 31.7*  MCV 97.5 99.4  PLT 223 250   Basic Metabolic Panel: Recent Labs  Lab 08/29/24 1437 08/31/24 1416  NA 133* 139  K 3.8 3.5  CL 103 101  CO2 23 24  GLUCOSE 98 98  BUN 6 6  CREATININE 0.73 0.72  CALCIUM 8.2* 8.4*   GFR: Estimated Creatinine Clearance: 83.8 mL/min (by C-G formula based on SCr of 0.72 mg/dL). Liver Function Tests: Recent Labs  Lab 08/29/24 1437  AST 114*  ALT 105*  ALKPHOS 109  BILITOT 0.9  PROT 5.7*  ALBUMIN 2.8*   No results for input(s): LIPASE, AMYLASE in the last 168 hours. No results for input(s): AMMONIA in the last 168 hours. Coagulation Profile: No results for input(s): INR, PROTIME in the last 168 hours. Cardiac Enzymes: No results for input(s): CKTOTAL, CKMB, CKMBINDEX, TROPONINI in the last 168 hours. BNP (last 3 results) No results for input(s): PROBNP in the last 8760 hours. HbA1C: No results for input(s): HGBA1C in the last 72 hours. CBG: No results for input(s): GLUCAP in the last 168 hours. Lipid Profile: No results for input(s): CHOL, HDL, LDLCALC, TRIG, CHOLHDL, LDLDIRECT in the last 72 hours. Thyroid  Function Tests: No results for input(s): TSH, T4TOTAL, FREET4, T3FREE, THYROIDAB in the last 72 hours. Anemia Panel: No results for input(s): VITAMINB12, FOLATE, FERRITIN, TIBC, IRON, RETICCTPCT in the last 72 hours. Urine analysis:    Component Value Date/Time   COLORURINE AMBER (A) 08/31/2024 1416   APPEARANCEUR TURBID (A) 08/31/2024 1416   APPEARANCEUR Clear 03/06/2015 1450   LABSPEC 1.027 08/31/2024 1416   LABSPEC 1.012 03/06/2015 1450   PHURINE 5.0 08/31/2024 1416   GLUCOSEU NEGATIVE 08/31/2024 1416   GLUCOSEU Negative 03/06/2015 1450   HGBUR NEGATIVE 08/31/2024 1416   BILIRUBINUR NEGATIVE 08/31/2024 1416    BILIRUBINUR Negative 03/06/2015 1450   KETONESUR NEGATIVE 08/31/2024 1416   PROTEINUR 100 (A) 08/31/2024 1416   NITRITE NEGATIVE 08/31/2024 1416   LEUKOCYTESUR MODERATE (A) 08/31/2024 1416   LEUKOCYTESUR Negative 03/06/2015 1450   Sepsis Labs: @LABRCNTIP (procalcitonin:4,lacticidven:4) )No results found for this or any previous visit (from the past 240 hours).   Radiological Exams on Admission:   Assessment/Plan Principal Problem:   UTI (urinary tract infection) Active Problems:   Internal jugular vein thrombosis, right (HCC)   Pulmonary embolism (HCC)   Asthma   Lupus (systemic lupus erythematosus) (HCC)   Fibromyalgia   Anal squamous cell carcinoma (HCC)   Breast mass in female   VIN III (vulvar intraepithelial neoplasia III)   Depression with anxiety   Obesity, Class III, BMI 40-49.9 (morbid obesity) (HCC)   Assessment and Plan:  UTI (urinary tract infection): Patient does not have fever or leukocytosis, lactic acid normal 1.1, clinically not septic.  Patient is persistently tachycardic with heart rate 110-120s.  Patient recently had CT scan of abdomen/pelvis on 08/17/24 which was negative for kidney stone, will not repeat CT scan today.  -Place in PCU for observation - IV cefepime (patient is immunosuppressed, will give antibiotic with broad coverage)  - IV fluid: 1 L LR, 2.5 L normal saline, Naima 75 cc/h for normal saline - Follow-up blood culture and urine culture  Addendum:pt's blood becomes soft, with SBP in upper 80s and lower 90s. Pt is at high risk for developing hypotension -will give another 1L of NS bolus -will start midodrine 10 mg tid -will give 25 g of albumin (dicussed with pt, she agreed) -will give 125 mg of solumedrol as stress dose.   Hx of pulmonary embolism (HCC) and  Internal jugular vein thrombosis, right  -continue home Lovenox  80 mg twice daily  Asthma: Stable -Bronchodilators as needed Mucinex   History of Lupus (systemic lupus  erythematosus) (HCC): currently not taking medications.  Stable per patient. -Observe closely  Fibromyalgia -prn oxycodone , gabapentin , Tylenol   Anal squamous cell carcinoma (HCC and breast mass in female: Patient was recently diagnosed with anal cancer and breast cancer.  Patient is following up with Dr. Babara of oncology.  Currently on chemotherapy and radiation therapy for anal cancer. - Temporarily hold Xeloda due to ongoing infection - Follow-up with Dr. Babara  Depression with anxiety -Continue home medications: Ativan, BuSpar , olanzapine  Obesity, Class III, BMI 40-49.9 (morbid obesity) (HCC): Body weight 91.2  Kg and BMI 40.60 kg/m2.  - Encourage losing weight - Exercise and healthy diet        DVT ppx: on full dose of Lovenox   Code Status: Full code   Family Communication:     not done, no family member is at bed side.      Disposition Plan:  Anticipate discharge back to previous environment  Consults called:  none  Admission status and Level of care: Progressive:    for obs          Dispo: The patient is from: Home              Anticipated d/c is to: Home              Anticipated d/c date is: 1 day              Patient currently is not medically stable to d/c.    Severity of Illness:  The appropriate patient status for this patient is INPATIENT. Inpatient status is judged to be reasonable and necessary in order to provide the required intensity of service to ensure the patient's safety. The patient's presenting symptoms, physical exam findings, and initial radiographic and laboratory data in the context of their chronic comorbidities is felt to place them at high risk for further clinical deterioration. Furthermore, it is not anticipated that the patient will be medically stable for discharge from the hospital within 2 midnights of admission.   * I certify that at the point of admission it is my clinical judgment that the patient will require inpatient hospital  care spanning beyond 2 midnights from the point of admission due to high intensity of service, high risk for further deterioration and high frequency of surveillance required.*       Date of Service 08/31/2024    Caleb Exon [facility]   If 7PM-7AM, please contact night-coverage [email or web address] 08/31/2024, 9:59 PM

## 2024-08-31 NOTE — ED Provider Notes (Signed)
 [facility] Provider Note    Event Date/Time   First MD Initiated Contact with Patient 08/31/24 1533     (approximate)   History   Dysuria   HPI  Monica Gonzalez is a 48 y.o. 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  She reports some lower abdominal pain but this is chronic and unchanged today.  She denies any fever or chills.  She has no nausea or vomiting.  She also is concerned that she needs a dressing change on her recently placed PICC line because there is some blood under the dressing.  I reviewed the past medical records.  The patient was just seen by Dr. Babara from oncology on 10/16 for follow-up of her cancers.  She was planned to be switched from 5-FU to Xeloda.  At that time she also had removal of a malpositioned right jugular Port-A-Cath and placement of a PICC line.   Physical Exam   Triage Vital Signs: ED Triage Vitals  Encounter Vitals Group     BP 08/31/24 1404 101/68     Girls Systolic BP Percentile --      Girls Diastolic BP Percentile --      Boys Systolic BP Percentile --      Boys Diastolic BP Percentile --      Pulse Rate 08/31/24 1404 (!) 125     Resp 08/31/24 1404 16     Temp 08/31/24 1404 98.4 F (36.9 C)     Temp Source 08/31/24 1404 Oral     SpO2 08/31/24 1404 100 %     Weight 08/31/24 1405 201 lb (91.2 kg)     Height 08/31/24 1405 4' 11 (1.499 m)     Head Circumference --      Peak Flow --      Pain Score 08/31/24 1405 8     Pain Loc --      Pain Education --      Exclude from Growth Chart --     Most recent vital signs: Vitals:   08/31/24 1932 08/31/24 2000  BP: (!) 99/54 108/60  Pulse: (!) 117 (!) 121  Resp: (!) 28 (!) 23  Temp:    SpO2: 97% 98%     General: Awake, no distress.  CV:  Good peripheral  perfusion.  Resp:  Normal effort.  Abd:  Soft with mild suprapubic tenderness.  No distention.  Other:  No jaundice or scleral icterus.  Left upper arm PICC line in place with no erythema or induration, no bleeding, no swelling or tenderness.  Small amount of dried blood under the dressing.   ED Results / Procedures / Treatments   Labs (all labs ordered are listed, but only abnormal results are displayed) Labs Reviewed  URINALYSIS, ROUTINE W REFLEX MICROSCOPIC - Abnormal; Notable for the following components:      Result Value   Color, Urine AMBER (*)    APPearance TURBID (*)    Protein, ur 100 (*)    Leukocytes,Ua MODERATE (*)    Bacteria, UA MANY (*)    All other components within normal limits  BASIC METABOLIC PANEL WITH GFR - Abnormal; Notable for the following components:   Calcium 8.4 (*)    All other components within normal limits  CBC - Abnormal; Notable for the following  components:   RBC 3.19 (*)    Hemoglobin 10.0 (*)    HCT 31.7 (*)    RDW 17.3 (*)    All other components within normal limits  CULTURE, BLOOD (ROUTINE X 2)  CULTURE, BLOOD (ROUTINE X 2)  URINE CULTURE  LACTIC ACID, PLASMA  PREGNANCY, URINE  BASIC METABOLIC PANEL WITH GFR  CBC     EKG    RADIOLOGY    PROCEDURES:  Critical Care performed: No  Procedures   MEDICATIONS ORDERED IN ED: Medications  0.9 %  sodium chloride  infusion (has no administration in time range)  albuterol  (PROVENTIL ) (2.5 MG/3ML) 0.083% nebulizer solution 2.5 mg (has no administration in time range)  dextromethorphan -guaiFENesin  (MUCINEX  DM) 30-600 MG per 12 hr tablet 1 tablet (has no administration in time range)  ondansetron  (ZOFRAN ) injection 4 mg (has no administration in time range)  acetaminophen  (TYLENOL ) tablet 650 mg (has no administration in time range)  cefTRIAXone  (ROCEPHIN ) 2 g in sodium chloride  0.9 % 100 mL IVPB (has no administration in time range)  oxyCODONE  (Oxy IR/ROXICODONE ) immediate release  tablet 5 mg (has no administration in time range)  oxyCODONE  (Oxy IR/ROXICODONE ) immediate release tablet 5 mg (5 mg Oral Given 08/31/24 1615)  sodium chloride  0.9 % bolus 1,000 mL (0 mLs Intravenous Stopped 08/31/24 1914)  ceFEPIme (MAXIPIME) 2 g in sodium chloride  0.9 % 100 mL IVPB (0 g Intravenous Stopped 08/31/24 1932)  lactated ringers  bolus 1,000 mL (1,000 mLs Intravenous New Bag/Given 08/31/24 1851)  sodium chloride  0.9 % bolus 1,000 mL (1,000 mLs Intravenous Bolus from Bag 08/31/24 1959)     IMPRESSION / MDM / ASSESSMENT AND PLAN / ED COURSE  I reviewed the triage vital signs and the nursing notes.  48 year old female with PMH as noted above presents with malodorous urine.  She has no other significant urinary symptoms.  She also is requesting that her PICC line dressing be changed.  Differential diagnosis includes, but is not limited to, UTI, other cystitis.  Will obtain urinalysis, basic labs, and have the IV team change the PICC line dressing.  The PICC line itself appears to be functioning normally with no evidence of infection or other acute complication.  Patient's presentation is most consistent with acute complicated illness / injury requiring diagnostic workup.  ----------------------------------------- 8:19 PM on 08/31/2024 -----------------------------------------  Urinalysis showed findings consistent with possible UTI with significant WBCs, RBCs, many bacteria.  CBC shows no leukocytosis.  Lactate is normal.  BMP is unremarkable.  However, the patient remained with a borderline low blood pressure and still tachycardic to around 120 despite fluids.  I am concerned for possible sepsis.  I ordered IV cefepime as well as additional fluids.  I consulted Dr. Hilma from the hospitalist service; based on our discussion he agrees to evaluate the patient for admission.  FINAL CLINICAL IMPRESSION(S) / ED DIAGNOSES   Final diagnoses:  Urinary tract infection without hematuria, site  unspecified  Sepsis, due to unspecified organism, unspecified whether acute organ dysfunction present [facility])     Rx / DC Orders   ED Discharge Orders     None        Note:  This document was prepared using Dragon voice recognition software and may include unintentional dictation errors.    Jacolyn Pae, MD 08/31/24 2019

## 2024-08-31 NOTE — ED Notes (Signed)
 Per Christina NT, lab has been called >30 mins ago to collect blood. Lab called x2 by this RN

## 2024-08-31 NOTE — ED Notes (Signed)
 ED TO INPATIENT HANDOFF REPORT  ED Nurse Name and Phone #: [phone]  S Name/Age/Gender Monica Gonzalez 48 y.o. female Room/Bed: ED09A/ED09A  Code Status   Code Status: Full Code  Home/SNF/Other Home Patient oriented to: self, place, time, and situation Is this baseline? Yes   Triage Complete: Triage complete  Chief Complaint UTI (urinary tract infection) [N39.0]  Triage Note Pt in via POV, reports dysuria and strong odor to urine x one day; also reports some pelvic pain, unsure if that is associated w/ these symptoms or due to current radiation therapy.  Patient also w/ new PICC line; last used on Thursday.  Appears to be some bleeding around the site; patient is concerned that dressing may need to be changed, states site did not appear that way when she was last scene at [facility] on Thursday.    Ambulatory to triage, NAD noted at this time.     Allergies Allergies  Allergen Reactions   Prednisone Shortness Of Breath and Swelling    Patient has taken methylprednisolone  (Depo-Medrol ) and dexamethasone  (Decadron ) without any documented side effects or adverse reactions. She describes that she used to take prednisone due to her bronchial asthma and respiratory problems, but later on she received some prednisone which into locations caused her to have significant swelling and shortness of breath. For that reason, she was told to avoid the use of prednisone.   Silicone Hives and Itching    Level of Care/Admitting Diagnosis ED Disposition     ED Disposition  Admit   Condition  --   Comment  Hospital Area: [facility] [[id number]]  Level of Care: Telemetry Medical [[id number]]  Diagnosis: UTI (urinary tract infection) [781136]  Admitting Physician: NIU, XILIN [[id number]]  Attending Physician: NIU, XILIN [[id number]]  For patients discharging to extended facilities (i.e. SNF, AL, group homes or LTAC) initiate:: Discharge to SNF/Facility Placement COVID-19 Lab Testing  Protocol          B Medical/Surgery History Past Medical History:  Diagnosis Date   Anemia    Anginal pain    Anxiety    Arthritis    Asthma    Cancer (HCC)    rectal cancer, breast cancer 2025   Chronic pain syndrome 05/21/2023   DDD (degenerative disc disease), cervical 05/22/2023   Deep venous thrombosis (HCC)    Depression    Dyspnea    Fibromyalgia    GERD (gastroesophageal reflux disease)    Headache    Heart murmur    History of hiatal hernia    History of kidney stones    Lupus    Malignant neoplasm of upper-outer quadrant of right female breast, unspecified estrogen receptor status (HCC) 2025   Pneumonia    PTSD (post-traumatic stress disorder)    Skin candidiasis 05/29/2024   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   Past Surgical History:  Procedure Laterality Date   BREAST BIOPSY Right 06/18/2024   US  RT BREAST BX W LOC DEV 1ST LESION IMG BX SPEC US  GUIDE 06/18/2024 [facility]   BREAST BIOPSY Right 06/18/2024   US  RT BREAST BX W LOC DEV EA ADD LESION IMG BX SPEC US  GUIDE 06/18/2024 [facility]   BREAST BIOPSY Right 06/18/2024   US  RT BREAST BX W LOC DEV EA ADD LESION IMG BX SPEC US  GUIDE 06/18/2024 [facility]   BREAST BIOPSY Right 07/05/2024   US  RT BREAST SAVI/RF TAG 1ST LESION US  GUIDE 07/05/2024 [facility]   BREAST BIOPSY Right 07/05/2024   US   RT BREAST SAVI/RF TAG EA ADD'L LESION US  GUIDE 07/05/2024 [facility]   BREAST LUMPECTOMY Right 07/25/2024   Procedure: BREAST LUMPECTOMY;  Surgeon: Tye Millet, DO;  Location: [facility];  Service: General;  Laterality: Right;   BREAST LUMPECTOMY WITH RADIO FREQUENCY LOCALIZER Right 07/08/2024   Procedure: BREAST LUMPECTOMY WITH RADIO FREQUENCY LOCALIZER;  Surgeon: Tye Millet, DO;  Location: [facility];  Service: General;  Laterality: Right;   COLONOSCOPY WITH PROPOFOL  N/A 02/17/2023   Procedure: COLONOSCOPY WITH PROPOFOL ;  Surgeon: Maryruth Ole DASEN, MD;  Location: [facility];  Service: Endoscopy;  Laterality: N/A;   ESOPHAGOGASTRODUODENOSCOPY (EGD) WITH PROPOFOL  N/A 02/17/2023   Procedure: ESOPHAGOGASTRODUODENOSCOPY (EGD) WITH PROPOFOL ;  Surgeon: Maryruth Ole DASEN, MD;  Location: [facility];  Service: Endoscopy;  Laterality: N/A;   EXTRACORPOREAL SHOCK WAVE LITHOTRIPSY     IR CV LINE INJECTION  08/21/2024   IR REMOVAL TUN ACCESS W/ PORT W/O FL MOD SED  08/28/2024   PORTACATH PLACEMENT Right 07/08/2024   Procedure: INSERTION, TUNNELED CENTRAL VENOUS DEVICE, WITH PORT;  Surgeon: Tye Millet, DO;  Location: [facility];  Service: General;  Laterality: Right;   RECTAL EXAM UNDER ANESTHESIA N/A 05/16/2024   Procedure: EXAM UNDER ANESTHESIA, RECTUM, INCISIONAL BIOPSY OF ANAL MASS;  Surgeon: Tye Millet, DO;  Location: [facility];  Service: General;  Laterality: N/A;   SENTINEL NODE BIOPSY Right 07/08/2024   Procedure: BIOPSY, LYMPH NODE, SENTINEL;  Surgeon: Tye Millet, DO;  Location: [facility];  Service: General;  Laterality: Right;   TONSILLECTOMY     TUBAL LIGATION     VULVECTOMY N/A 07/29/2020   Procedure: WIDE EXCISION VULVECTOMY;  Surgeon: Mancil Barter, MD;  Location: [facility];  Service: Gynecology;  Laterality: N/A;     A IV Location/Drains/Wounds Patient Lines/Drains/Airways Status     Active Line/Drains/Airways     Name Placement date Placement time Site Days   Peripheral IV 08/31/24 20 G 1.88 Anterior;Distal;Right;Upper Arm 08/31/24  1559  Arm  less than 1   Peripheral IV 08/31/24 22 G 1 Anterior;Distal;Left Forearm 08/31/24  1845  Forearm  less than 1   PICC Single Lumen 08/28/24 Left Basilic 39 cm 1 cm 08/28/24  8396  Basilic  3   Wound 07/08/24 1301 Surgical Closed Surgical Incision Breast Right 07/08/24  1301  Breast  54   Wound 07/08/24 1301 Surgical Closed Surgical Incision Chest Right 07/08/24  1301  Chest  54   Wound 07/08/24 1301 Surgical Closed Surgical Incision Neck Right 07/08/24  1301  Neck  54   Wound 07/25/24 0933  Surgical Closed Surgical Incision Breast Lateral;Right;Upper 07/25/24  0933  Breast  37            Intake/Output Last 24 hours  Intake/Output Summary (Last 24 hours) at 08/31/2024 1944 Last data filed at 08/31/2024 1932 Gross per 24 hour  Intake 2000 ml  Output --  Net 2000 ml    Labs/Imaging Results for orders placed or performed during the hospital encounter of 08/31/24 (from the past 48 hours)  Urinalysis, Routine w reflex microscopic -Urine, Clean Catch     Status: Abnormal   Collection Time: 08/31/24  2:16 PM  Result Value Ref Range   Color, Urine AMBER (A) YELLOW    Comment: BIOCHEMICALS MAY BE AFFECTED BY COLOR   APPearance TURBID (A) CLEAR   Specific Gravity, Urine 1.027 1.005 - 1.030   pH 5.0 5.0 - 8.0   Glucose, UA NEGATIVE NEGATIVE mg/dL   Hgb urine dipstick NEGATIVE NEGATIVE  Bilirubin Urine NEGATIVE NEGATIVE   Ketones, ur NEGATIVE NEGATIVE mg/dL   Protein, ur 899 (A) NEGATIVE mg/dL   Nitrite NEGATIVE NEGATIVE   Leukocytes,Ua MODERATE (A) NEGATIVE   RBC / HPF >50 0 - 5 RBC/hpf   WBC, UA >50 0 - 5 WBC/hpf   Bacteria, UA MANY (A) NONE SEEN   Squamous Epithelial / HPF 11-20 0 - 5 /HPF   WBC Clumps PRESENT    Mucus PRESENT    Hyaline Casts, UA PRESENT    Ca Oxalate Crys, UA PRESENT     Comment: Performed at [facility], [address] [place], [place] [address]  Basic metabolic panel     Status: Abnormal   Collection Time: 08/31/24  2:16 PM  Result Value Ref Range   Sodium 139 135 - 145 mmol/L   Potassium 3.5 3.5 - 5.1 mmol/L   Chloride 101 98 - 111 mmol/L   CO2 24 22 - 32 mmol/L   Glucose, Bld 98 70 - 99 mg/dL    Comment: Glucose reference range applies only to samples taken after fasting for at least 8 hours.   BUN 6 6 - 20 mg/dL   Creatinine, Ser 9.27 0.44 - 1.00 mg/dL   Calcium 8.4 (L) 8.9 - 10.3 mg/dL   GFR, Estimated >39 >39 mL/min    Comment: (NOTE) Calculated using the CKD-EPI Creatinine Equation (2021)    Anion gap 14 5 - 15     Comment: Performed at [facility], [address] [place], [place] [address]  CBC     Status: Abnormal   Collection Time: 08/31/24  2:16 PM  Result Value Ref Range   WBC 6.5 4.0 - 10.5 K/uL   RBC 3.19 (L) 3.87 - 5.11 MIL/uL   Hemoglobin 10.0 (L) 12.0 - 15.0 g/dL   HCT 68.2 (L) 63.9 - 53.9 %   MCV 99.4 80.0 - 100.0 fL   MCH 31.3 26.0 - 34.0 pg   MCHC 31.5 30.0 - 36.0 g/dL   RDW 82.6 (H) 88.4 - 84.4 %   Platelets 250 150 - 400 K/uL   nRBC 0.0 0.0 - 0.2 %    Comment: Performed at [facility], [address] [place], [place] [address]  Lactic acid, plasma     Status: None   Collection Time: 08/31/24  6:20 PM  Result Value Ref Range   Lactic Acid, Venous 1.1 0.5 - 1.9 mmol/L    Comment: Performed at [facility], [address], [place], [place] [address]   DG Chest Port 1 View Result Date: 08/31/2024 EXAM: 1 VIEW XRAY OF THE CHEST 08/31/2024 07:31:10 PM COMPARISON: 08/13/2024 CLINICAL HISTORY: Questionable sepsis - evaluate for abnormality. Appears to be some bleeding around the site; patient is concerned that dressing may need to be changed, states site did not appear that way when she was last scene at [facility] on Thursday. Ambulatory to triage, NAD noted at this time. FINDINGS: LINES, TUBES AND DEVICES: Interval removal of a right chest wall port-a-cath. Interval placement of a left PICC with tip overlying the expected region of the distal superior vena cava. LUNGS AND PLEURA: Slightly low lung volumes. No focal pulmonary opacity. No pulmonary edema. No pleural effusion. No pneumothorax. HEART AND MEDIASTINUM: Slightly prominent cardiac silhouette likely due to AP portable technique and low lung volumes . No acute abnormality of the mediastinal silhouette. BONES AND SOFT TISSUES: No acute osseous abnormality. IMPRESSION: 1. Low lung volumes with no definite acute cardiopulmonary  process. 2. Apparent cardiomegaly on AP portable; consider repeat PA and  lateral chest radiographs for confirmation. Electronically signed by: Morgane Naveau MD 08/31/2024 07:34 PM EDT RP Workstation: [id number]    Pending Labs Unresulted Labs (From admission, onward)     Start     Ordered   09/01/24 0500  Basic metabolic panel  Tomorrow morning,   R        08/31/24 1942   09/01/24 0500  CBC  Tomorrow morning,   R        08/31/24 1942   08/31/24 1939  Pregnancy, urine  Add-on,   AD        08/31/24 1938   08/31/24 1939  Urine Culture (for pregnant, neutropenic or urologic patients or patients with an indwelling urinary catheter)  (Urine Labs)  Add-on,   AD       Question:  Indication  Answer:  Dysuria   08/31/24 1938   08/31/24 1835  Blood Culture (routine x 2)  (Septic presentation on arrival (screening labs, nursing and treatment orders for obvious sepsis))  BLOOD CULTURE X 2,   STAT      08/31/24 1835            Vitals/Pain Today's Vitals   08/31/24 1704 08/31/24 1813 08/31/24 1824 08/31/24 1932  BP:  (!) 100/43  (!) 99/54  Pulse:  (!) 124  (!) 117  Resp:  20  (!) 28  Temp:   98.6 F (37 C)   TempSrc:   Oral   SpO2:  95%  97%  Weight:      Height:      PainSc: 6        Isolation Precautions No active isolations  Medications Medications  0.9 %  sodium chloride  infusion (has no administration in time range)  albuterol  (PROVENTIL ) (2.5 MG/3ML) 0.083% nebulizer solution 2.5 mg (has no administration in time range)  dextromethorphan -guaiFENesin  (MUCINEX  DM) 30-600 MG per 12 hr tablet 1 tablet (has no administration in time range)  ondansetron  (ZOFRAN ) injection 4 mg (has no administration in time range)  acetaminophen  (TYLENOL ) tablet 650 mg (has no administration in time range)  cefTRIAXone  (ROCEPHIN ) 2 g in sodium chloride  0.9 % 100 mL IVPB (has no administration in time range)  oxyCODONE  (Oxy IR/ROXICODONE ) immediate release tablet 5 mg (has no administration in time range)  sodium chloride  0.9 % bolus 1,000 mL (has no administration in  time range)  oxyCODONE  (Oxy IR/ROXICODONE ) immediate release tablet 5 mg (5 mg Oral Given 08/31/24 1615)  sodium chloride  0.9 % bolus 1,000 mL (0 mLs Intravenous Stopped 08/31/24 1914)  ceFEPIme (MAXIPIME) 2 g in sodium chloride  0.9 % 100 mL IVPB (0 g Intravenous Stopped 08/31/24 1932)  lactated ringers  bolus 1,000 mL (1,000 mLs Intravenous New Bag/Given 08/31/24 1851)    Mobility walks     Focused Assessments Cardiac Assessment Handoff:    Lab Results  Component Value Date   TROPONINI <0.03 12/24/2018   No results found for: DDIMER Does the Patient currently have chest pain? No    R Recommendations: See Admitting Provider Note  Report given to:   Additional Notes:

## 2024-08-31 NOTE — ED Notes (Addendum)
 Admitting MD Hilma notified by floor staff concerning patient level of care at time of admission. This RN, MD Hilma, and Primary RN Zoyie discussed with MD Hilma regarding level of care as concerns raised by floor staff. Per Dr. Hilma, pt to finish receiving IVF fluid bolus and re-evaluate VS as patient continues to be tachycardic on cardiac monitor. Per MD Hilma, will re-evaluate level of care after patient finishes fluid bolus. Primary RN Zoyie reports that patient is alert and oriented x 4, ambulatory without assistance, and in NAD. Pt will transport pending re-evaluation of level of care after IVF bolus.

## 2024-08-31 NOTE — ED Triage Notes (Signed)
 Pt in via POV, reports dysuria and strong odor to urine x one day; also reports some pelvic pain, unsure if that is associated w/ these symptoms or due to current radiation therapy.  Patient also w/ new PICC line; last used on Thursday.  Appears to be some bleeding around the site; patient is concerned that dressing may need to be changed, states site did not appear that way when she was last scene at [facility] on Thursday.    Ambulatory to triage, NAD noted at this time.

## 2024-08-31 NOTE — ED Notes (Signed)
 1 set cultures drawn at this time.

## 2024-09-01 ENCOUNTER — Other Ambulatory Visit: Payer: Self-pay

## 2024-09-01 DIAGNOSIS — T451X5A Adverse effect of antineoplastic and immunosuppressive drugs, initial encounter: Secondary | ICD-10-CM | POA: Diagnosis not present

## 2024-09-01 DIAGNOSIS — J4520 Mild intermittent asthma, uncomplicated: Secondary | ICD-10-CM | POA: Diagnosis not present

## 2024-09-01 DIAGNOSIS — J9601 Acute respiratory failure with hypoxia: Secondary | ICD-10-CM | POA: Diagnosis not present

## 2024-09-01 DIAGNOSIS — F13239 Sedative, hypnotic or anxiolytic dependence with withdrawal, unspecified: Secondary | ICD-10-CM | POA: Diagnosis present

## 2024-09-01 DIAGNOSIS — C210 Malignant neoplasm of anus, unspecified: Secondary | ICD-10-CM | POA: Diagnosis present

## 2024-09-01 DIAGNOSIS — E66813 Obesity, class 3: Secondary | ICD-10-CM | POA: Diagnosis present

## 2024-09-01 DIAGNOSIS — M797 Fibromyalgia: Secondary | ICD-10-CM | POA: Diagnosis present

## 2024-09-01 DIAGNOSIS — F419 Anxiety disorder, unspecified: Secondary | ICD-10-CM | POA: Diagnosis present

## 2024-09-01 DIAGNOSIS — I82C11 Acute embolism and thrombosis of right internal jugular vein: Secondary | ICD-10-CM | POA: Diagnosis present

## 2024-09-01 DIAGNOSIS — M329 Systemic lupus erythematosus, unspecified: Secondary | ICD-10-CM | POA: Diagnosis present

## 2024-09-01 DIAGNOSIS — Z86718 Personal history of other venous thrombosis and embolism: Secondary | ICD-10-CM | POA: Diagnosis not present

## 2024-09-01 DIAGNOSIS — J45909 Unspecified asthma, uncomplicated: Secondary | ICD-10-CM | POA: Diagnosis present

## 2024-09-01 DIAGNOSIS — D071 Carcinoma in situ of vulva: Secondary | ICD-10-CM | POA: Diagnosis not present

## 2024-09-01 DIAGNOSIS — J189 Pneumonia, unspecified organism: Secondary | ICD-10-CM | POA: Diagnosis present

## 2024-09-01 DIAGNOSIS — G894 Chronic pain syndrome: Secondary | ICD-10-CM | POA: Diagnosis present

## 2024-09-01 DIAGNOSIS — D539 Nutritional anemia, unspecified: Secondary | ICD-10-CM | POA: Diagnosis present

## 2024-09-01 DIAGNOSIS — A419 Sepsis, unspecified organism: Secondary | ICD-10-CM | POA: Diagnosis present

## 2024-09-01 DIAGNOSIS — N390 Urinary tract infection, site not specified: Secondary | ICD-10-CM | POA: Diagnosis present

## 2024-09-01 DIAGNOSIS — F32A Depression, unspecified: Secondary | ICD-10-CM | POA: Diagnosis present

## 2024-09-01 DIAGNOSIS — Z6841 Body Mass Index (BMI) 40.0 and over, adult: Secondary | ICD-10-CM | POA: Diagnosis not present

## 2024-09-01 DIAGNOSIS — N63 Unspecified lump in unspecified breast: Secondary | ICD-10-CM | POA: Diagnosis not present

## 2024-09-01 DIAGNOSIS — C50919 Malignant neoplasm of unspecified site of unspecified female breast: Secondary | ICD-10-CM | POA: Diagnosis present

## 2024-09-01 DIAGNOSIS — D701 Agranulocytosis secondary to cancer chemotherapy: Secondary | ICD-10-CM | POA: Diagnosis not present

## 2024-09-01 DIAGNOSIS — Y842 Radiological procedure and radiotherapy as the cause of abnormal reaction of the patient, or of later complication, without mention of misadventure at the time of the procedure: Secondary | ICD-10-CM | POA: Diagnosis present

## 2024-09-01 DIAGNOSIS — N3000 Acute cystitis without hematuria: Secondary | ICD-10-CM | POA: Diagnosis not present

## 2024-09-01 DIAGNOSIS — D849 Immunodeficiency, unspecified: Secondary | ICD-10-CM | POA: Diagnosis present

## 2024-09-01 DIAGNOSIS — Z7901 Long term (current) use of anticoagulants: Secondary | ICD-10-CM | POA: Diagnosis not present

## 2024-09-01 DIAGNOSIS — F418 Other specified anxiety disorders: Secondary | ICD-10-CM | POA: Diagnosis not present

## 2024-09-01 LAB — CBC
HCT: 30 % — ABNORMAL LOW (ref 36.0–46.0)
Hemoglobin: 9.5 g/dL — ABNORMAL LOW (ref 12.0–15.0)
MCH: 32.1 pg (ref 26.0–34.0)
MCHC: 31.7 g/dL (ref 30.0–36.0)
MCV: 101.4 fL — ABNORMAL HIGH (ref 80.0–100.0)
Platelets: 194 K/uL (ref 150–400)
RBC: 2.96 MIL/uL — ABNORMAL LOW (ref 3.87–5.11)
RDW: 17.5 % — ABNORMAL HIGH (ref 11.5–15.5)
WBC: 3.6 K/uL — ABNORMAL LOW (ref 4.0–10.5)
nRBC: 0 % (ref 0.0–0.2)

## 2024-09-01 LAB — BASIC METABOLIC PANEL WITH GFR
Anion gap: 9 (ref 5–15)
BUN: 6 mg/dL (ref 6–20)
CO2: 23 mmol/L (ref 22–32)
Calcium: 8.3 mg/dL — ABNORMAL LOW (ref 8.9–10.3)
Chloride: 106 mmol/L (ref 98–111)
Creatinine, Ser: 0.68 mg/dL (ref 0.44–1.00)
GFR, Estimated: >60 mL/min (ref >60)
Glucose, Bld: 175 mg/dL — ABNORMAL HIGH (ref 70–99)
Potassium: 3.6 mmol/L (ref 3.5–5.1)
Sodium: 138 mmol/L (ref 135–145)

## 2024-09-01 LAB — PREGNANCY, URINE: Preg Test, Ur: NEGATIVE

## 2024-09-01 MED ORDER — SODIUM CHLORIDE 0.9 % IV BOLUS: 500.0000 mL | Freq: Once | INTRAVENOUS | Status: DC

## 2024-09-01 MED ORDER — LACTATED RINGERS IV SOLN: INTRAVENOUS | Status: DC

## 2024-09-01 MED ORDER — SODIUM CHLORIDE 0.9 % IV BOLUS
500.0000 mL | Freq: Once | INTRAVENOUS | Status: AC
  Administered 2024-09-01: 500 mL via INTRAVENOUS

## 2024-09-01 MED ORDER — SUCRALFATE 1 G PO TABS
1.0000 g | ORAL_TABLET | Freq: Three times a day (TID) | ORAL | Status: DC
  Administered 2024-09-01 – 2024-09-06 (×20): 1 g via ORAL
  Filled 2024-09-01 (×20): qty 1

## 2024-09-01 MED ORDER — ALBUMIN HUMAN 25 % IV SOLN
25.0000 g | Freq: Once | INTRAVENOUS | Status: AC
  Administered 2024-09-01: 12.5 g via INTRAVENOUS
  Filled 2024-09-01: qty 100

## 2024-09-01 MED ORDER — SODIUM CHLORIDE 0.9 % IV SOLN
2.0000 g | Freq: Two times a day (BID) | INTRAVENOUS | Status: DC
  Administered 2024-09-01 – 2024-09-03 (×6): 2 g via INTRAVENOUS
  Filled 2024-09-01 (×6): qty 12.5

## 2024-09-01 MED ORDER — SODIUM CHLORIDE 0.9 % IV BOLUS
1000.0000 mL | Freq: Once | INTRAVENOUS | Status: AC
  Administered 2024-09-01: 1000 mL via INTRAVENOUS

## 2024-09-01 MED ORDER — MIDODRINE HCL 5 MG PO TABS
10.0000 mg | ORAL_TABLET | Freq: Three times a day (TID) | ORAL | Status: DC
  Administered 2024-09-01 – 2024-09-06 (×17): 10 mg via ORAL
  Filled 2024-09-01 (×17): qty 2

## 2024-09-01 MED ORDER — AQUAPHOR EX OINT
TOPICAL_OINTMENT | Freq: Two times a day (BID) | CUTANEOUS | Status: DC | PRN
Start: 2024-09-01 — End: 2024-09-06

## 2024-09-01 MED ORDER — CEFEPIME HCL 2 G IV SOLR: 2.0000 g | Freq: Two times a day (BID) | INTRAVENOUS | Status: DC

## 2024-09-01 MED ORDER — METHYLPREDNISOLONE SODIUM SUCC 125 MG IJ SOLR
125.0000 mg | Freq: Once | INTRAMUSCULAR | Status: AC
  Administered 2024-09-01: 125 mg via INTRAVENOUS
  Filled 2024-09-01: qty 2

## 2024-09-01 NOTE — ED Notes (Signed)
 Notified MD Mansy of pt hypotension, requested fluid bolus.

## 2024-09-01 NOTE — ED Notes (Signed)
 2L  added at this time, pt O2 sats noted to be in high 80s when sleeping, back up to 95 upon waking.

## 2024-09-01 NOTE — ED Notes (Signed)
 Provided pericare to pt inner buttocks, perineal area, and inner thighs and applies Aquafor form pt home supply per her request d/t preexisting radiation burns.

## 2024-09-01 NOTE — ED Notes (Signed)
 Assisted pt up to toilet. Pt ambulatory with slow steady gait from bed to toilet. Pt tolerated activity without difficulty.

## 2024-09-01 NOTE — Progress Notes (Signed)
 PROGRESS NOTE    Monica Gonzalez  FMW:[id number] DOB: March 08, 1976 DOA: 08/31/2024 PCP: Epifanio Alm SQUIBB, MD  Chief Complaint  Patient presents with   Dysuria    Hospital Course:  Monica Gonzalez is a 48 y.o. 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 Patient admitted for UTI Has PICC line in Left arm  Subjective: Patient was examined at the bedside, new to me today.  Continues to have suprapubic pain. Also reports having excoriations of skin near the anal area and also vaginal, where patient has been receiving radiation   Objective: Vitals:   09/01/24 0730 09/01/24 0800 09/01/24 0806 09/01/24 0900  BP: (!) 92/55 (!) 86/57 93/66 95/63   Pulse: 99 97  95  Resp: 15 17  14   Temp:      TempSrc:      SpO2: 97% 96%  96%  Weight:      Height:        Intake/Output Summary (Last 24 hours) at 09/01/2024 1029 Last data filed at 09/01/2024 0554 Gross per 24 hour  Intake 2100 ml  Output --  Net 2100 ml   Filed Weights   08/31/24 1405  Weight: 91.2 kg    Examination: General: Not in acute distress Cardiac: S1/S2, RRR, No murmurs, No gallops or rubs. Respiratory: No rales, wheezing, rhonchi or rubs. GI: Soft, nondistended, has mild tenderness in suprapubic area, no rebound pain, no organomegaly, BS present. GU: No hematuria Ext: No pitting leg edema bilaterally. 1+DP/PT pulse bilaterally. Skin: No rashes.  Neuro: Alert, oriented X3, no gross focal deficits  Assessment & Plan:  UTI (urinary tract infection) - Afebrile, wbc dropped - leukopenia, No flank pain/tenderness - recently had CT  scan of abdomen/pelvis on 08/17/24 which was negative for kidney stone, will not repeat CT scan unless needed - IV cefepime (patient is immunosuppressed, will give antibiotics with broad coverage)  - IV fluids - Follow Ucx, Bcx   Hypotension - improving - received adequate fluid resuscitation - Continue IV fluids - On Midodrine 10 tid  Normocytic anemia - Monitor Hb  Hx of pulmonary embolism (HCC) and  Internal jugular vein thrombosis, right  -continue home Lovenox  80 mg bid   Asthma: Stable -Bronchodilators, as needed Mucinex    History of Lupus (systemic lupus erythematosus) (HCC): currently not taking medications.  Stable per patient. 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  Patient is following up with Dr. Babara of oncology.  Currently on chemotherapy and radiation therapy for anal cancer. - Temporarily hold Xeloda due to ongoing infection - Follow-up with Dr. Babara   Depression with anxiety -Continue home medications: Ativan prn, BuSpar , olanzapine   Obesity, Class III, BMI 40-49.9 (morbid obesity) (HCC): Body weight 91.2  Kg and BMI 40.60 kg/m2.  - Encourage losing weight - Exercise and healthy diet   DVT prophylaxis: Lovenox    Code Status: Full Code Disposition:  TBD  Consultants:  None  Procedures:  None  Antimicrobials:  Anti-infectives (From admission, onward)    Start     Dose/Rate Route Frequency Ordered  Stop   09/01/24 0600  cefTRIAXone  (ROCEPHIN ) 2 g in sodium chloride  0.9 % 100 mL IVPB  Status:  Discontinued        2 g 200 mL/hr over 30 Minutes Intravenous Every 24 hours 08/31/24 1938 09/01/24 0103   09/01/24 0600  ceFEPIme (MAXIPIME) 2 g in sodium chloride  0.9 % 100 mL IVPB        2 g 200 mL/hr over 30 Minutes Intravenous Every 12 hours 09/01/24 0119     09/01/24 0200  ceFEPIme  (MAXIPIME) 2 g in sodium chloride  0.9 % 100 mL IVPB  Status:  Discontinued        2 g 200 mL/hr over 30 Minutes Intravenous Every 12 hours 09/01/24 0118 09/01/24 0119   08/31/24 1845  ceFEPIme (MAXIPIME) 2 g in sodium chloride  0.9 % 100 mL IVPB        2 g 200 mL/hr over 30 Minutes Intravenous  Once 08/31/24 1835 08/31/24 1932       Data Reviewed: I have personally reviewed following labs and imaging studies CBC: Recent Labs  Lab 08/29/24 1437 08/31/24 1416 09/01/24 0526  WBC 4.8 6.5 3.6*  NEUTROABS 3.5  --   --   HGB 10.1* 10.0* 9.5*  HCT 31.1* 31.7* 30.0*  MCV 97.5 99.4 101.4*  PLT 223 250 194   Basic Metabolic Panel: Recent Labs  Lab 08/29/24 1437 08/31/24 1416 09/01/24 0526  NA 133* 139 138  K 3.8 3.5 3.6  CL 103 101 106  CO2 23 24 23   GLUCOSE 98 98 175*  BUN 6 6 6   CREATININE 0.73 0.72 0.68  CALCIUM 8.2* 8.4* 8.3*   GFR: Estimated Creatinine Clearance: 83.8 mL/min (by C-G formula based on SCr of 0.68 mg/dL). Liver Function Tests: Recent Labs  Lab 08/29/24 1437  AST 114*  ALT 105*  ALKPHOS 109  BILITOT 0.9  PROT 5.7*  ALBUMIN 2.8*   CBG: No results for input(s): GLUCAP in the last 168 hours.  Recent Results (from the past 240 hours)  Blood Culture (routine x 2)     Status: None (Preliminary result)   Collection Time: 08/31/24  3:38 PM   Specimen: BLOOD  Result Value Ref Range Status   Specimen Description BLOOD BLOOD RIGHT ARM  Final   Special Requests   Final    BOTTLES DRAWN AEROBIC AND ANAEROBIC Blood Culture adequate volume   Culture   Final    NO GROWTH < 24 HOURS Performed at [facility], [address] [place], [place] [address]    Report Status PENDING  Incomplete  Blood Culture (routine x 2)     Status: None (Preliminary result)   Collection Time: 08/31/24  6:40 PM   Specimen: BLOOD  Result Value Ref Range Status   Specimen Description BLOOD BLOOD LEFT WRIST  Final   Special Requests   Final    BOTTLES DRAWN AEROBIC  AND ANAEROBIC Blood Culture adequate volume   Culture   Final    NO GROWTH < 12 HOURS Performed at [facility], [address], [place], [place] [address]    Report Status PENDING  Incomplete     Radiology Studies: DG Chest Port 1 View Result Date: 08/31/2024 EXAM: 1 VIEW XRAY OF THE CHEST 08/31/2024 07:31:10 PM COMPARISON: 08/13/2024 CLINICAL HISTORY: Questionable sepsis - evaluate for abnormality. Appears to be some bleeding around the site; patient is concerned that dressing may need to be changed, states site did not appear that way when she was  last scene at [facility] on Thursday. Ambulatory to triage, NAD noted at this time. FINDINGS: LINES, TUBES AND DEVICES: Interval removal of a right chest wall port-a-cath. Interval placement of a left PICC with tip overlying the expected region of the distal superior vena cava. LUNGS AND PLEURA: Slightly low lung volumes. No focal pulmonary opacity. No pulmonary edema. No pleural effusion. No pneumothorax. HEART AND MEDIASTINUM: Slightly prominent cardiac silhouette likely due to AP portable technique and low lung volumes . No acute abnormality of the mediastinal silhouette. BONES AND SOFT TISSUES: No acute osseous abnormality. IMPRESSION: 1. Low lung volumes with no definite acute cardiopulmonary process. 2. Apparent cardiomegaly on AP portable; consider repeat PA and lateral chest radiographs for confirmation. Electronically signed by: Morgane Naveau MD 08/31/2024 07:34 PM EDT RP Workstation: [id number]    Scheduled Meds:  budesonide   0.25 mg Nebulization BID   busPIRone   5 mg Oral BID   DULoxetine   30 mg Oral QHS   enoxaparin   80 mg Subcutaneous Q12H   fentaNYL   1 patch Transdermal Q72H   ferrous sulfate  325 mg Oral Daily   gabapentin   600 mg Oral TID   midodrine  10 mg Oral TID WC   multivitamin with minerals  1 tablet Oral Daily   OLANZapine  5 mg Oral QHS   pantoprazole  40 mg Oral BID   Continuous Infusions:  sodium  chloride 75 mL/hr at 09/01/24 0722   ceFEPime (MAXIPIME) IV Stopped (09/01/24 0554)     LOS: 0 days  MDM: Patient is high risk for one or more organ failure.  They necessitate ongoing hospitalization for continued IV therapies and subsequent lab monitoring. Total time spent interpreting labs and vitals, reviewing the medical record, coordinating care amongst consultants and care team members, directly assessing and discussing care with the patient and/or family: 55 min Laree Lock, MD [facility]  To contact the attending physician between 7A-7P please use Epic Chat. To contact the covering physician during after hours 7P-7A, please review Amion.  09/01/2024, 10:29 AM   *This document has been created with the assistance of dictation software. Please excuse typographical errors. *

## 2024-09-02 ENCOUNTER — Inpatient Hospital Stay

## 2024-09-02 ENCOUNTER — Ambulatory Visit

## 2024-09-02 ENCOUNTER — Encounter

## 2024-09-02 ENCOUNTER — Inpatient Hospital Stay: Admitting: Nurse Practitioner

## 2024-09-02 DIAGNOSIS — N3000 Acute cystitis without hematuria: Secondary | ICD-10-CM | POA: Diagnosis not present

## 2024-09-02 LAB — CBC
HCT: 27.4 % — ABNORMAL LOW (ref 36.0–46.0)
Hemoglobin: 8.5 g/dL — ABNORMAL LOW (ref 12.0–15.0)
MCH: 32.1 pg (ref 26.0–34.0)
MCHC: 31 g/dL (ref 30.0–36.0)
MCV: 103.4 fL — ABNORMAL HIGH (ref 80.0–100.0)
Platelets: 206 K/uL (ref 150–400)
RBC: 2.65 MIL/uL — ABNORMAL LOW (ref 3.87–5.11)
RDW: 17.7 % — ABNORMAL HIGH (ref 11.5–15.5)
WBC: 5.5 K/uL (ref 4.0–10.5)
nRBC: 0 % (ref 0.0–0.2)

## 2024-09-02 LAB — BASIC METABOLIC PANEL WITH GFR
Anion gap: 9 (ref 5–15)
BUN: 9 mg/dL (ref 6–20)
CO2: 27 mmol/L (ref 22–32)
Calcium: 8.3 mg/dL — ABNORMAL LOW (ref 8.9–10.3)
Chloride: 107 mmol/L (ref 98–111)
Creatinine, Ser: 0.7 mg/dL (ref 0.44–1.00)
GFR, Estimated: >60 mL/min (ref >60)
Glucose, Bld: 120 mg/dL — ABNORMAL HIGH (ref 70–99)
Potassium: 4 mmol/L (ref 3.5–5.1)
Sodium: 143 mmol/L (ref 135–145)

## 2024-09-02 LAB — URINE CULTURE

## 2024-09-02 LAB — MAGNESIUM: Magnesium: 1.9 mg/dL (ref 1.7–2.4)

## 2024-09-02 LAB — PHOSPHORUS: Phosphorus: 3.4 mg/dL (ref 2.5–4.6)

## 2024-09-02 MED ORDER — SODIUM CHLORIDE 0.9 % IV BOLUS
1000.0000 mL | Freq: Once | INTRAVENOUS | Status: AC
  Administered 2024-09-02: 1000 mL via INTRAVENOUS

## 2024-09-02 MED ORDER — MIDODRINE HCL 5 MG PO TABS
10.0000 mg | ORAL_TABLET | Freq: Once | ORAL | Status: AC
  Administered 2024-09-02: 10 mg via ORAL
  Filled 2024-09-02: qty 2

## 2024-09-02 MED ORDER — SODIUM CHLORIDE 0.9 % IV BOLUS
500.0000 mL | Freq: Once | INTRAVENOUS | Status: AC
  Administered 2024-09-02: 500 mL via INTRAVENOUS

## 2024-09-02 NOTE — ED Notes (Signed)
Pt sleeping.  Respirations even and unlabored.

## 2024-09-02 NOTE — ED Notes (Signed)
 Notified Many MD of pt persistent hypotension post 2nd bolus.

## 2024-09-02 NOTE — Consult Note (Signed)
 WOC consulted for radiation skin irritation treatment, each campus has different stock items for use in the pharmacies as well as WOC nursing typically does not order topicals until we verify with oncology what can be used in active patients.   I have sent a  to both admitting team and oncologist regarding this.   Johnesha Acheampong [facility], CNS, CWON-AP [phone]

## 2024-09-02 NOTE — ED Notes (Signed)
 Notified MD Mansy that pt remains hypotensive post bolus.

## 2024-09-02 NOTE — ED Notes (Signed)
 Patient is resting comfortably.

## 2024-09-02 NOTE — ED Notes (Signed)
Pt ambulatory to bathroom independently with steady gait.

## 2024-09-02 NOTE — Progress Notes (Addendum)
 PROGRESS NOTE    Monica Gonzalez  FMW:[id number] DOB: 05/09/1976 DOA: 08/31/2024 PCP: Epifanio Alm SQUIBB, MD  Chief Complaint  Patient presents with   Dysuria    Hospital Course:  Monica Gonzalez is a 48 y.o. 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 Patient admitted for UTI Has PICC line in Left arm  Subjective: Patient was examined at the bedside.  Continues to have dysuria and suprapubic pain. BP soft, slowly improving   Objective: Vitals:   09/02/24 0911 09/02/24 1021 09/02/24 1259 09/02/24 1358  BP: 103/66 94/62 97/61  (!) 100/52  Pulse:  92 100 (!) 103  Resp:  18 16 19   Temp:      TempSrc:      SpO2:  97% 92% 90%  Weight:      Height:        Intake/Output Summary (Last 24 hours) at 09/02/2024 1444 Last data filed at 09/02/2024 0227 Gross per 24 hour  Intake 814.46 ml  Output --  Net 814.46 ml   Filed Weights   08/31/24 1405  Weight: 91.2 kg    Examination: General: Not in acute distress Cardiac: S1/S2, RRR, No murmurs, No gallops or rubs. Respiratory: No rales, wheezing, rhonchi or rubs. GI: Soft, nondistended, has mild tenderness in suprapubic area, no CVA tenderness b/l GU: No hematuria Ext: No pitting leg edema bilaterally. 1+DP/PT pulse bilaterally. Skin: No rashes.  Neuro: Alert, oriented X3, no gross focal deficits  Assessment & Plan:  UTI (urinary tract infection) - recently had CT scan of abdomen/pelvis on 08/17/24 which was negative for kidney stone, will not repeat CT scan unless needed - Bcx NGTD, Ucx Multiple species present, suggest recollection - IV  cefepime (patient is immunosuppressed, will give antibiotics with broad coverage)  - IV fluids - Follow Ucx, Bcx. Send repeat Urine culture (already on abx)  Hypotension - improving - received adequate fluid resuscitation - Continue IV fluids - On Midodrine 10 tid  Normocytic anemia - Hb slowly down trending, on IV fluids - No evidence of bleeding, Monitor Hb  Hx of pulmonary embolism (HCC) and  Internal jugular vein thrombosis, right  -continue home Lovenox  80 mg bid   Asthma: Stable -Bronchodilators, as needed Mucinex    History of Lupus (systemic lupus erythematosus) (HCC): currently not taking medications.  Stable per patient. 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  Patient is following up with Dr. Babara of oncology.  Currently on chemotherapy and radiation therapy for anal cancer. - Temporarily hold Xeloda due to ongoing infection - Follow-up with Dr. Babara   Depression with anxiety -Continue home medications: Ativan prn, BuSpar , olanzapine   Obesity, Class III, BMI 40-49.9 (morbid obesity) (HCC): Body weight 91.2  Kg and BMI 40.60 kg/m2.  - Encourage losing weight - Exercise and healthy diet  -- Able to move around at baseline, slow   DVT prophylaxis: Lovenox    Code Status: Full Code Disposition:  TBD  Consultants:  None  Procedures:  None  Antimicrobials:  Anti-infectives (From admission, onward)    Start  Dose/Rate Route Frequency Ordered Stop   09/01/24 0600  cefTRIAXone  (ROCEPHIN ) 2 g in sodium chloride  0.9 % 100 mL IVPB  Status:  Discontinued        2 g 200 mL/hr over 30 Minutes Intravenous Every 24 hours 08/31/24 1938 09/01/24 0103   09/01/24 0600  ceFEPIme (MAXIPIME) 2 g in sodium chloride  0.9 % 100 mL IVPB        2 g 200 mL/hr over 30 Minutes Intravenous Every 12 hours 09/01/24  0119     09/01/24 0200  ceFEPIme (MAXIPIME) 2 g in sodium chloride  0.9 % 100 mL IVPB  Status:  Discontinued        2 g 200 mL/hr over 30 Minutes Intravenous Every 12 hours 09/01/24 0118 09/01/24 0119   08/31/24 1845  ceFEPIme (MAXIPIME) 2 g in sodium chloride  0.9 % 100 mL IVPB        2 g 200 mL/hr over 30 Minutes Intravenous  Once 08/31/24 1835 08/31/24 1932       Data Reviewed: I have personally reviewed following labs and imaging studies CBC: Recent Labs  Lab 08/29/24 1437 08/31/24 1416 09/01/24 0526 09/02/24 0422  WBC 4.8 6.5 3.6* 5.5  NEUTROABS 3.5  --   --   --   HGB 10.1* 10.0* 9.5* 8.5*  HCT 31.1* 31.7* 30.0* 27.4*  MCV 97.5 99.4 101.4* 103.4*  PLT 223 250 194 206   Basic Metabolic Panel: Recent Labs  Lab 08/29/24 1437 08/31/24 1416 09/01/24 0526 09/02/24 0422  NA 133* 139 138 143  K 3.8 3.5 3.6 4.0  CL 103 101 106 107  CO2 23 24 23 27   GLUCOSE 98 98 175* 120*  BUN 6 6 6 9   CREATININE 0.73 0.72 0.68 0.70  CALCIUM 8.2* 8.4* 8.3* 8.3*  MG  --   --   --  1.9  PHOS  --   --   --  3.4   GFR: Estimated Creatinine Clearance: 83.8 mL/min (by C-G formula based on SCr of 0.7 mg/dL). Liver Function Tests: Recent Labs  Lab 08/29/24 1437  AST 114*  ALT 105*  ALKPHOS 109  BILITOT 0.9  PROT 5.7*  ALBUMIN 2.8*   CBG: No results for input(s): GLUCAP in the last 168 hours.  Recent Results (from the past 240 hours)  Urine Culture (for pregnant, neutropenic or urologic patients or patients with an indwelling urinary catheter)     Status: Abnormal   Collection Time: 08/31/24  2:16 PM   Specimen: Urine, Clean Catch  Result Value Ref Range Status   Specimen Description   Final    URINE, CLEAN CATCH Performed at [facility], [address] [place], [place] [address]    Special Requests   Final    NONE Performed at [facility], [address] [place], [place] [address]    Culture MULTIPLE SPECIES PRESENT, SUGGEST RECOLLECTION (A)  Final    Report Status 09/02/2024 FINAL  Final  Blood Culture (routine x 2)     Status: None (Preliminary result)   Collection Time: 08/31/24  3:38 PM   Specimen: BLOOD  Result Value Ref Range Status   Specimen Description BLOOD BLOOD RIGHT ARM  Final   Special Requests   Final    BOTTLES DRAWN AEROBIC AND ANAEROBIC Blood Culture adequate volume   Culture   Final    NO GROWTH 2 DAYS Performed at [facility], [address], [place], [place] [address]    Report Status PENDING  Incomplete  Blood Culture (routine x 2)     Status: None (Preliminary result)   Collection Time: 08/31/24  6:40 PM   Specimen: BLOOD  Result Value Ref Range Status   Specimen Description BLOOD BLOOD LEFT WRIST  Final   Special Requests   Final    BOTTLES DRAWN AEROBIC AND ANAEROBIC Blood Culture adequate volume   Culture   Final    NO GROWTH 2 DAYS Performed at [facility], [address] [place], [place] [address]    Report Status PENDING  Incomplete     Radiology Studies: DG Chest Port 1 View Result Date: 08/31/2024 EXAM: 1 VIEW XRAY OF THE CHEST 08/31/2024 07:31:10 PM COMPARISON: 08/13/2024 CLINICAL HISTORY: Questionable sepsis - evaluate for abnormality. Appears to be some bleeding around the site; patient is concerned that dressing may need to be changed, states site did not appear that way when she was last scene at [facility] on Thursday. Ambulatory to triage, NAD noted at this time. FINDINGS: LINES, TUBES AND DEVICES: Interval removal of a right chest wall port-a-cath. Interval placement of a left PICC with tip overlying the expected region of the distal superior vena cava. LUNGS AND PLEURA: Slightly low lung volumes. No focal pulmonary opacity. No pulmonary edema. No pleural effusion. No pneumothorax. HEART AND MEDIASTINUM: Slightly prominent cardiac silhouette likely due to AP portable technique and low lung volumes . No acute abnormality of the mediastinal silhouette. BONES AND SOFT  TISSUES: No acute osseous abnormality. IMPRESSION: 1. Low lung volumes with no definite acute cardiopulmonary process. 2. Apparent cardiomegaly on AP portable; consider repeat PA and lateral chest radiographs for confirmation. Electronically signed by: Morgane Naveau MD 08/31/2024 07:34 PM EDT RP Workstation: [id number]    Scheduled Meds:  budesonide   0.25 mg Nebulization BID   busPIRone   5 mg Oral BID   DULoxetine   30 mg Oral QHS   enoxaparin   80 mg Subcutaneous Q12H   fentaNYL   1 patch Transdermal Q72H   ferrous sulfate  325 mg Oral Daily   gabapentin   600 mg Oral TID   midodrine  10 mg Oral TID WC   multivitamin with minerals  1 tablet Oral Daily   OLANZapine  5 mg Oral QHS   pantoprazole  40 mg Oral BID   sucralfate   1 g Oral TID WC & HS   Continuous Infusions:  ceFEPime (MAXIPIME) IV Stopped (09/02/24 9392)   lactated ringers  75 mL/hr at 09/02/24 1323     LOS: 1 day  MDM: Patient is high risk for one or more organ failure.  They necessitate ongoing hospitalization for continued IV therapies and subsequent lab monitoring. Total time spent interpreting labs and vitals, reviewing the medical record, coordinating care amongst consultants and care team members, directly assessing and discussing care with the patient and/or family: 55 min Laree Lock, MD [facility]  To contact the attending physician between 7A-7P please use Epic Chat. To contact the covering physician during after hours 7P-7A, please review Amion.  09/02/2024, 2:44 PM   *This document has been created with the assistance of dictation software. Please excuse typographical errors. *

## 2024-09-02 NOTE — ED Notes (Signed)
 MD at bedside.

## 2024-09-03 ENCOUNTER — Ambulatory Visit

## 2024-09-03 ENCOUNTER — Inpatient Hospital Stay

## 2024-09-03 DIAGNOSIS — N3000 Acute cystitis without hematuria: Secondary | ICD-10-CM | POA: Diagnosis not present

## 2024-09-03 LAB — BASIC METABOLIC PANEL WITH GFR
Anion gap: 10 (ref 5–15)
BUN: 7 mg/dL (ref 6–20)
CO2: 26 mmol/L (ref 22–32)
Calcium: 8.1 mg/dL — ABNORMAL LOW (ref 8.9–10.3)
Chloride: 104 mmol/L (ref 98–111)
Creatinine, Ser: 0.71 mg/dL (ref 0.44–1.00)
GFR, Estimated: >60 mL/min (ref >60)
Glucose, Bld: 88 mg/dL (ref 70–99)
Potassium: 3.7 mmol/L (ref 3.5–5.1)
Sodium: 140 mmol/L (ref 135–145)

## 2024-09-03 LAB — RESPIRATORY PANEL BY PCR

## 2024-09-03 LAB — LACTIC ACID, PLASMA: Lactic Acid, Venous: 2 mmol/L (ref 0.5–1.9)

## 2024-09-03 LAB — CBC
HCT: 26.5 % — ABNORMAL LOW (ref 36.0–46.0)
Hemoglobin: 8.4 g/dL — ABNORMAL LOW (ref 12.0–15.0)
MCH: 32.1 pg (ref 26.0–34.0)
MCHC: 31.7 g/dL (ref 30.0–36.0)
MCV: 101.1 fL — ABNORMAL HIGH (ref 80.0–100.0)
Platelets: 208 K/uL (ref 150–400)
RBC: 2.62 MIL/uL — ABNORMAL LOW (ref 3.87–5.11)
RDW: 17.9 % — ABNORMAL HIGH (ref 11.5–15.5)
WBC: 3.4 K/uL — ABNORMAL LOW (ref 4.0–10.5)
nRBC: 0 % (ref 0.0–0.2)

## 2024-09-03 LAB — VITAMIN B12: Vitamin B-12: 246 pg/mL (ref 180–914)

## 2024-09-03 LAB — FOLATE: Folate: 14.3 ng/mL (ref >5.9)

## 2024-09-03 LAB — BRAIN NATRIURETIC PEPTIDE: B Natriuretic Peptide: 167.5 pg/mL — ABNORMAL HIGH (ref 0.0–100.0)

## 2024-09-03 MED ORDER — SODIUM CHLORIDE 0.9% FLUSH
10.0000 mL | Freq: Two times a day (BID) | INTRAVENOUS | Status: DC
  Administered 2024-09-03 – 2024-09-06 (×4): 10 mL

## 2024-09-03 MED ORDER — IBUPROFEN 400 MG PO TABS
600.0000 mg | ORAL_TABLET | Freq: Once | ORAL | Status: AC
  Administered 2024-09-03: 600 mg via ORAL
  Filled 2024-09-03: qty 2

## 2024-09-03 MED ORDER — IOHEXOL 9 MG/ML PO SOLN
500.0000 mL | ORAL | Status: AC
  Administered 2024-09-03 (×2): 500 mL via ORAL

## 2024-09-03 MED ORDER — SODIUM CHLORIDE 0.9 % IV SOLN
1.0000 g | Freq: Three times a day (TID) | INTRAVENOUS | Status: DC
  Administered 2024-09-03 – 2024-09-04 (×2): 1 g via INTRAVENOUS
  Filled 2024-09-03 (×3): qty 20

## 2024-09-03 MED ORDER — CHLORHEXIDINE GLUCONATE CLOTH 2 % EX PADS
6.0000 | MEDICATED_PAD | Freq: Every day | CUTANEOUS | Status: DC
  Administered 2024-09-03 – 2024-09-06 (×4): 6 via TOPICAL

## 2024-09-03 MED ORDER — HYDROCORTISONE (PERIANAL) 2.5 % EX CREA
1.0000 | TOPICAL_CREAM | Freq: Two times a day (BID) | CUTANEOUS | Status: DC
  Administered 2024-09-03 – 2024-09-06 (×6): 1 via RECTAL
  Filled 2024-09-03 (×2): qty 28.35

## 2024-09-03 MED ORDER — IOHEXOL 300 MG/ML  SOLN
100.0000 mL | Freq: Once | INTRAMUSCULAR | Status: AC | PRN
  Administered 2024-09-03: 100 mL via INTRAVENOUS

## 2024-09-03 NOTE — Plan of Care (Signed)

## 2024-09-03 NOTE — TOC Initial Note (Signed)
 Transition of Care [facility]) - Initial/Assessment Note    Patient Details  Name: Monica Gonzalez MRN: [id number] Date of Birth: 07-05-1976  [facility] CM/SW Contact:    Monica JAYSON Carpen, LCSW Phone Number: 09/03/2024, 4:41 PM  Clinical Narrative:  Readmission prevention screen complete. CSW met with patient. No family at bedside. CSW introduced role and explained that discharge planning would be discussed. PCP is Alm Needle, MD. Patient drives herself to appointments. [facility] is [facility] [employer] [facility]. No issues affording medications. Patient lives home with her 42 year old son. No home health or DME use prior to admission. No further concerns. CSW will continue to follow patient for support and facilitate return home once stable. Daughter will likely transport her home at discharge.  Expected Discharge Plan: Home/Self Care Barriers to Discharge: Continued Medical Work up   Patient Goals and CMS Choice            Expected Discharge Plan and Services     Post Acute Care Choice: NA Living arrangements for the past 2 months: Mobile Home                                      Prior Living Arrangements/Services Living arrangements for the past 2 months: Mobile Home Lives with:: Minor Children Patient language and need for interpreter reviewed:: Yes Do you feel safe going back to the place where you live?: Yes      Need for Family Participation in Patient Care: Yes (Comment) Care giver support system in place?: Yes (comment)   Criminal Activity/Legal Involvement Pertinent to Current Situation/Hospitalization: No - Comment as needed  Activities of Daily Living   ADL Screening (condition at time of admission) Independently performs ADLs?: Yes (appropriate for developmental age) Is the patient deaf or have difficulty hearing?: No Does the patient have difficulty seeing, even when wearing glasses/contacts?: No Does the patient have difficulty  concentrating, remembering, or making decisions?: No  Permission Sought/Granted                  Emotional Assessment Appearance:: Appears stated age Attitude/Demeanor/Rapport: Engaged, Gracious Affect (typically observed): Accepting, Appropriate, Calm, Pleasant Orientation: : Oriented to Self, Oriented to Place, Oriented to  Time, Oriented to Situation Alcohol / Substance Use: Not Applicable Psych Involvement: No (comment)  Admission diagnosis:  UTI (urinary tract infection) [N39.0] Urinary tract infection without hematuria, site unspecified [N39.0] Sepsis, due to unspecified organism, unspecified whether acute organ dysfunction present [facility]) [A41.9] Patient Active Problem List   Diagnosis Date Noted   UTI (urinary tract infection) 08/31/2024   Pulmonary embolism (HCC) 08/31/2024   Obesity, Class III, BMI 40-49.9 (morbid obesity) (HCC) 08/31/2024   Depression with anxiety 08/31/2024   Elevated LFTs 08/29/2024   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   Genetic testing 08/27/2024   Diarrhea 08/19/2024   AKI (acute kidney injury) 08/15/2024   Chemotherapy-induced nausea 08/15/2024   Chemotherapy induced diarrhea 08/15/2024   Internal jugular vein thrombosis, right (HCC) 07/31/2024   Hypokalemia 07/31/2024   Constipation 07/31/2024   Acute dyspnea 07/29/2024   Acute subsegmental pulmonary embolism (HCC) 07/29/2024   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(07/23/24) in the setting of port placement (07/08/24)(deep vein thrombosis) 07/29/2024   Anxiety    Deep venous thrombosis (HCC) 07/23/2024   Invasive carcinoma of breast (HCC) 06/26/2024   Breast mass in female 06/13/2024   Anal squamous cell carcinoma (HCC)  05/29/2024   Skin candidiasis 05/29/2024   Painful cervical range of motion 07/24/2023   Impaired range of motion of cervical spine 07/24/2023   History of Prednisone Allergy 07/04/2023    Class: History of   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, C5-6)  06/07/2023   Cervical sensory radiculopathy (Left) 06/07/2023   Chronic upper extremity pain (Left) 06/07/2023   Shoulder radicular pain (Left) 06/07/2023   Chronic pain 05/22/2023   Chronic shoulder pain (2ry area of Pain) (Left) 05/22/2023   Chronic low back pain (3ry area of Pain) (Bilateral) (R>L) w/o sciatica 05/22/2023   Cervicogenic headache (4th area of Pain) (Bilateral) 05/22/2023   Chronic feet pain (5th area of Pain) (Bilateral) 05/22/2023   Abnormal MRI, cervical spine (01/16/2023) 05/22/2023   DDD (degenerative disc disease), cervical 05/22/2023   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,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) 05/22/2023   Cervical facet hypertrophy 05/22/2023   Grade 1 Retrolisthesis of C4/C5 & C5/C6 05/22/2023   Hand fracture, sequela (Right) (06/09/2015) 05/22/2023   Chronic pain syndrome 05/21/2023   Pharmacologic therapy 05/21/2023   Disorder of skeletal system 05/21/2023   Problems influencing health status 05/21/2023   Fibromyalgia 02/07/2023   Colles' fracture of radius, sequela (Right) 12/29/2022   Palpitations 07/23/2020   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 [place] ([place]) [place] [address] Phone: [phone] Fax: [phone]  [facility] [address] [place] - [address] [place] [address] Phone: [phone] Fax: [phone]  [id number] [place], [place] - [address] [place] [address] Phone: [phone] Fax: [phone]  [id number] [place], [place] - [address] [place]  [address] Phone: [phone] Fax: [phone]  [facility] [address] [place] [address] Phone: [phone] Fax: [phone]  [facility] - [facility] [address] [place] [address] Phone: [phone] Fax: [phone]  [facility] [place], [place] - [address] [place] [address] Phone: [phone] Fax: [phone]     Social Drivers of Health (SDOH) Social History: SDOH Screenings   Food Insecurity: No Food Insecurity (09/02/2024)  Housing: Low Risk  (09/02/2024)  Transportation Needs: No Transportation Needs (09/02/2024)  Utilities: Not At Risk (09/02/2024)  Depression (PHQ2-9): Low Risk  (08/29/2024)  Financial Resource Strain: Medium Risk (05/15/2024)   [facility] from [facility]  Tobacco Use: Low Risk  (08/31/2024)  Recent Concern: Tobacco Use - Medium Risk (08/05/2024)   [facility] from [facility]   SDOH Interventions:     Readmission Risk Interventions    09/03/2024    4:40 PM  Readmission Risk Prevention Plan  Transportation Screening Complete  PCP or Specialist Appt within 3-5 Days Complete  Social Work Consult for Recovery Care Planning/Counseling Complete  Palliative Care Screening Not Applicable  Medication Review [employer]) Complete

## 2024-09-03 NOTE — Consult Note (Signed)
 WOC follow up on request for topical treatment for radiation irritations to the anal areas and buttocks.  Oncology outpatient has noted that they prescribe Anusol on the OP basis. When Anusol entered inpatient it pops up contraindications, will ask admitting team to prescribe if still desired.  Discussed POC with patient and bedside nurse.  Re consult if needed, will not follow at this time. Thanks  Anjelina Dung [employer], RN,CWOCN, CNS, [employer] [phone]

## 2024-09-03 NOTE — Progress Notes (Addendum)
 PROGRESS NOTE    Monica Gonzalez  FMW:[id number] DOB: 1976-10-16 DOA: 08/31/2024 PCP: Epifanio Alm SQUIBB, MD  Chief Complaint  Patient presents with   Dysuria    Hospital Course:  Monica Gonzalez is a 48 y.o. 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 Patient admitted for UTI Has PICC line in Left arm  Subjective: Patient was examined at the bedside.  Continues to have dysuria and suprapubic pain with no improvement Also reports cough BP soft and mild tachycardia   Objective: Vitals:   09/02/24 2320 09/03/24 0508 09/03/24 0809 09/03/24 1146  BP: 108/60 (!) 83/48 (!) 114/59 110/63  Pulse: (!) 112 (!) 112 (!) 110 (!) 113  Resp: 18 16 18 18   Temp:  100 F (37.8 C) 99.7 F (37.6 C) 99.8 F (37.7 C)  TempSrc:   Oral Oral  SpO2: 98% 92% 97% 94%  Weight:      Height:        Intake/Output Summary (Last 24 hours) at 09/03/2024 1643 Last data filed at 09/03/2024 1152 Gross per 24 hour  Intake 1626.42 ml  Output 1400 ml  Net 226.42 ml   Filed Weights   08/31/24 1405  Weight: 91.2 kg    Examination: General: Not in acute distress Cardiac: S1/S2, RRR, No murmurs, No gallops or rubs. Respiratory: No rales, wheezing, rhonchi or rubs. GI: Soft, nondistended, has mild tenderness in suprapubic area, no CVA tenderness b/l GU: No hematuria Ext: No pitting leg edema bilaterally. 1+DP/PT pulse bilaterally. Skin: No rashes.  Neuro: Alert, oriented X3, no gross focal deficits  Assessment & Plan:  UTI (urinary tract infection) - recently had CT scan of abdomen/pelvis on 08/17/24 which was  negative for kidney stone, not done on adm - Has LUE PICC, Blood cultures NGTD - Ucx Multiple species present, suggest recollection - IV cefepime (patient is immunosuppressed, will give antibiotics with broad coverage)  - IV fluids - CT abd/pelvis with contrast pending, as symptoms not improving - Follow Ucx, Bcx. Send repeat Urine culture (already on abx)  Addendum: - Temp 103F, HR 115's, BP improved 107/61 - Telemetry monitoring - Check Lactic acid - Change Cefepime to Meropenem - Send repeat blood culture  Hypotension - improving - received adequate fluid resuscitation - On Midodrine 10 tid, stop IV fluids  Mild hypervolemia - BNP elevated, CXR negative. S/p aggressive fluid resuscitation - Discontinue IV fluids  Cough - CXR negative - RVP pending  Macrocytic anemia - Hb slowly down trending, on IV fluids - FA, B12 level - No evidence of bleeding, Monitor Hb  Hx of pulmonary embolism (HCC) and  Internal jugular vein thrombosis, right  -continue home Lovenox  80 mg bid   Asthma: Stable -Bronchodilators, as needed Mucinex    History of Lupus (systemic lupus erythematosus) (HCC): currently not taking medications.  Stable per patient. 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  Patient is following up with Dr. Babara of oncology.  Currently on chemotherapy and radiation therapy for anal cancer. - Temporarily hold Xeloda due to ongoing infection - Follow-up  with Dr. 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  - Encourage losing weight - Exercise and healthy diet  -- Able to move around  at baseline, slow   DVT prophylaxis: Lovenox    Code Status: Full Code Disposition:  TBD  Consultants:  None  Procedures:  None  Antimicrobials:  Anti-infectives (From admission, onward)    Start     Dose/Rate Route Frequency Ordered Stop   09/01/24 0600  cefTRIAXone  (ROCEPHIN ) 2 g in sodium chloride  0.9 % 100 mL IVPB  Status:  Discontinued        2 g 200 mL/hr over 30 Minutes Intravenous Every 24 hours 08/31/24 1938 09/01/24 0103   09/01/24 0600  ceFEPIme (MAXIPIME) 2 g in sodium chloride  0.9 % 100 mL IVPB        2 g 200 mL/hr over 30 Minutes Intravenous Every 12 hours 09/01/24 0119     09/01/24 0200  ceFEPIme (MAXIPIME) 2 g in sodium chloride  0.9 % 100 mL IVPB  Status:  Discontinued        2 g 200 mL/hr over 30 Minutes Intravenous Every 12 hours 09/01/24 0118 09/01/24 0119   08/31/24 1845  ceFEPIme (MAXIPIME) 2 g in sodium chloride  0.9 % 100 mL IVPB        2 g 200 mL/hr over 30 Minutes Intravenous  Once 08/31/24 1835 08/31/24 1932       Data Reviewed: I have personally reviewed following labs and imaging studies CBC: Recent Labs  Lab 08/29/24 1437 08/31/24 1416 09/01/24 0526 09/02/24 0422 09/03/24 0615  WBC 4.8 6.5 3.6* 5.5 3.4*  NEUTROABS 3.5  --   --   --   --   HGB 10.1* 10.0* 9.5* 8.5* 8.4*  HCT 31.1* 31.7* 30.0* 27.4* 26.5*  MCV 97.5 99.4 101.4* 103.4* 101.1*  PLT 223 250 194 206 208   Basic Metabolic Panel: Recent Labs  Lab 08/29/24 1437 08/31/24 1416 09/01/24 0526 09/02/24 0422 09/03/24 0615  NA 133* 139 138 143 140  K 3.8 3.5 3.6 4.0 3.7  CL 103 101 106 107 104  CO2 23 24 23 27 26   GLUCOSE 98 98 175* 120* 88  BUN 6 6 6 9 7   CREATININE 0.73 0.72 0.68 0.70 0.71  CALCIUM 8.2* 8.4* 8.3* 8.3* 8.1*  MG  --   --   --  1.9  --   PHOS  --   --   --  3.4  --    GFR: Estimated Creatinine Clearance: 83.8 mL/min (by C-G formula based on SCr of 0.71 mg/dL). Liver Function Tests: Recent Labs  Lab 08/29/24 1437  AST 114*  ALT 105*  ALKPHOS 109   BILITOT 0.9  PROT 5.7*  ALBUMIN 2.8*   CBG: No results for input(s): GLUCAP in the last 168 hours.  Recent Results (from the past 240 hours)  Urine Culture (for pregnant, neutropenic or urologic patients or patients with an indwelling urinary catheter)     Status: Abnormal   Collection Time: 08/31/24  2:16 PM   Specimen: Urine, Clean Catch  Result Value Ref Range Status   Specimen Description   Final    URINE, CLEAN CATCH Performed at [facility], [address] [place], [place] [address]    Special Requests   Final  NONE Performed at [facility], [address] [place], [place] [address]    Culture MULTIPLE SPECIES PRESENT, SUGGEST RECOLLECTION (A)  Final   Report Status 09/02/2024 FINAL  Final  Blood Culture (routine x 2)     Status: None (Preliminary result)   Collection Time: 08/31/24  3:38 PM   Specimen: BLOOD  Result Value Ref Range Status   Specimen Description BLOOD BLOOD RIGHT ARM  Final   Special Requests   Final    BOTTLES DRAWN AEROBIC AND ANAEROBIC Blood Culture adequate volume   Culture   Final    NO GROWTH 3 DAYS Performed at [facility], [address], [place], [place] [address]    Report Status PENDING  Incomplete  Blood Culture (routine x 2)     Status: None (Preliminary result)   Collection Time: 08/31/24  6:40 PM   Specimen: BLOOD  Result Value Ref Range Status   Specimen Description BLOOD BLOOD LEFT WRIST  Final   Special Requests   Final    BOTTLES DRAWN AEROBIC AND ANAEROBIC Blood Culture adequate volume   Culture   Final    NO GROWTH 3 DAYS Performed at [facility], [address] [place], [place] [address]    Report Status PENDING  Incomplete     Radiology Studies: DG Chest 1 View Result Date: 09/03/2024 CLINICAL DATA:  Pneumonia. EXAM: DG CHEST 1V COMPARISON:  08/31/2024 FINDINGS: Left-sided PICC line unchanged with tip over the SVC. Lungs are hypoinflated with minimal stable prominence  of the central pulmonary vasculature. No acute consolidation or effusion. Cardiomediastinal silhouette and remainder the exam is unchanged. IMPRESSION: Hypoinflation without acute cardiopulmonary disease. Electronically Signed   By: Toribio Agreste M.D.   On: 09/03/2024 08:54    Scheduled Meds:  budesonide   0.25 mg Nebulization BID   busPIRone   5 mg Oral BID   Chlorhexidine  Gluconate Cloth  6 each Topical Daily   DULoxetine   30 mg Oral QHS   enoxaparin   80 mg Subcutaneous Q12H   fentaNYL   1 patch Transdermal Q72H   ferrous sulfate  325 mg Oral Daily   gabapentin   600 mg Oral TID   hydrocortisone  1 Application Rectal BID   midodrine  10 mg Oral TID WC   multivitamin with minerals  1 tablet Oral Daily   OLANZapine  5 mg Oral QHS   pantoprazole  40 mg Oral BID   sodium chloride  flush  10-40 mL Intracatheter Q12H   sucralfate   1 g Oral TID WC & HS   Continuous Infusions:  ceFEPime (MAXIPIME) IV Stopped (09/03/24 0652)     LOS: 2 days  MDM: Patient is high risk for one or more organ failure.  They necessitate ongoing hospitalization for continued IV therapies and subsequent lab monitoring. Total time spent interpreting labs and vitals, reviewing the medical record, coordinating care amongst consultants and care team members, directly assessing and discussing care with the patient and/or family: 55 min Laree Lock, MD [facility]  To contact the attending physician between 7A-7P please use Epic Chat. To contact the covering physician during after hours 7P-7A, please review Amion.  09/03/2024, 4:43 PM   *This document has been created with the assistance of dictation software. Please excuse typographical errors. *

## 2024-09-04 ENCOUNTER — Inpatient Hospital Stay

## 2024-09-04 ENCOUNTER — Ambulatory Visit

## 2024-09-04 ENCOUNTER — Other Ambulatory Visit: Payer: Self-pay | Admitting: Medical Genetics

## 2024-09-04 DIAGNOSIS — Z006 Encounter for examination for normal comparison and control in clinical research program: Secondary | ICD-10-CM

## 2024-09-04 DIAGNOSIS — A419 Sepsis, unspecified organism: Principal | ICD-10-CM

## 2024-09-04 LAB — CBC WITH DIFFERENTIAL/PLATELET
Abs Immature Granulocytes: 0.07 K/uL (ref 0.00–0.07)
Basophils Absolute: 0 K/uL (ref 0.0–0.1)
Basophils Relative: 0 %
Eosinophils Absolute: 0 K/uL (ref 0.0–0.5)
Eosinophils Relative: 1 %
HCT: 29.6 % — ABNORMAL LOW (ref 36.0–46.0)
Hemoglobin: 9.6 g/dL — ABNORMAL LOW (ref 12.0–15.0)
Immature Granulocytes: 3 %
Lymphocytes Relative: 6 %
Lymphs Abs: 0.2 K/uL — ABNORMAL LOW (ref 0.7–4.0)
MCH: 32.2 pg (ref 26.0–34.0)
MCHC: 32.4 g/dL (ref 30.0–36.0)
MCV: 99.3 fL (ref 80.0–100.0)
Monocytes Absolute: 0.2 K/uL (ref 0.1–1.0)
Monocytes Relative: 8 %
Neutro Abs: 2.3 K/uL (ref 1.7–7.7)
Neutrophils Relative %: 82 %
Platelets: 219 K/uL (ref 150–400)
RBC: 2.98 MIL/uL — ABNORMAL LOW (ref 3.87–5.11)
RDW: 18.5 % — ABNORMAL HIGH (ref 11.5–15.5)
WBC: 2.8 K/uL — ABNORMAL LOW (ref 4.0–10.5)
nRBC: 0 % (ref 0.0–0.2)

## 2024-09-04 LAB — BASIC METABOLIC PANEL WITH GFR
Anion gap: 14 (ref 5–15)
BUN: 7 mg/dL (ref 6–20)
CO2: 27 mmol/L (ref 22–32)
Calcium: 8.5 mg/dL — ABNORMAL LOW (ref 8.9–10.3)
Chloride: 101 mmol/L (ref 98–111)
Creatinine, Ser: 0.8 mg/dL (ref 0.44–1.00)
GFR, Estimated: >60 mL/min (ref >60)
Glucose, Bld: 103 mg/dL — ABNORMAL HIGH (ref 70–99)
Potassium: 3.8 mmol/L (ref 3.5–5.1)
Sodium: 142 mmol/L (ref 135–145)

## 2024-09-04 LAB — PROCALCITONIN: Procalcitonin: 0.15 ng/mL

## 2024-09-04 LAB — LACTIC ACID, PLASMA
Lactic Acid, Venous: 0.8 mmol/L (ref 0.5–1.9)
Lactic Acid, Venous: 1 mmol/L (ref 0.5–1.9)
Lactic Acid, Venous: 1.9 mmol/L (ref 0.5–1.9)

## 2024-09-04 LAB — URINE CULTURE: Culture: NO GROWTH

## 2024-09-04 LAB — CBC
HCT: 30.2 % — ABNORMAL LOW (ref 36.0–46.0)
Hemoglobin: 9.6 g/dL — ABNORMAL LOW (ref 12.0–15.0)
MCH: 31.7 pg (ref 26.0–34.0)
MCHC: 31.8 g/dL (ref 30.0–36.0)
MCV: 99.7 fL (ref 80.0–100.0)
Platelets: 218 K/uL (ref 150–400)
RBC: 3.03 MIL/uL — ABNORMAL LOW (ref 3.87–5.11)
RDW: 18.3 % — ABNORMAL HIGH (ref 11.5–15.5)
WBC: 2.9 K/uL — ABNORMAL LOW (ref 4.0–10.5)
nRBC: 0 % (ref 0.0–0.2)

## 2024-09-04 MED ORDER — VANCOMYCIN HCL 2000 MG/400ML IV SOLN
2000.0000 mg | Freq: Once | INTRAVENOUS | Status: AC
  Administered 2024-09-04: 2000 mg via INTRAVENOUS
  Filled 2024-09-04: qty 400

## 2024-09-04 MED ORDER — SODIUM CHLORIDE 0.9 % IV SOLN
2.0000 g | Freq: Three times a day (TID) | INTRAVENOUS | Status: DC
  Administered 2024-09-04 – 2024-09-06 (×6): 2 g via INTRAVENOUS
  Filled 2024-09-04 (×8): qty 12.5

## 2024-09-04 MED ORDER — ACETAMINOPHEN 325 MG PO TABS
650.0000 mg | ORAL_TABLET | ORAL | Status: DC
  Administered 2024-09-04 – 2024-09-06 (×9): 650 mg via ORAL
  Filled 2024-09-04 (×10): qty 2

## 2024-09-04 MED ORDER — ACETAMINOPHEN 325 MG PO TABS
650.0000 mg | ORAL_TABLET | ORAL | Status: DC | PRN
  Administered 2024-09-04 (×2): 650 mg via ORAL
  Filled 2024-09-04 (×2): qty 2

## 2024-09-04 MED ORDER — CYANOCOBALAMIN 500 MCG PO TABS
500.0000 ug | ORAL_TABLET | Freq: Every day | ORAL | Status: DC
  Administered 2024-09-04 – 2024-09-06 (×3): 500 ug via ORAL
  Filled 2024-09-04 (×3): qty 1

## 2024-09-04 MED ORDER — HYDROCORTISONE SOD SUC (PF) 100 MG IJ SOLR
50.0000 mg | Freq: Four times a day (QID) | INTRAMUSCULAR | Status: AC
  Administered 2024-09-04 – 2024-09-05 (×4): 50 mg via INTRAVENOUS
  Filled 2024-09-04 (×4): qty 1

## 2024-09-04 MED ORDER — LACTATED RINGERS IV BOLUS
500.0000 mL | Freq: Once | INTRAVENOUS | Status: AC
  Administered 2024-09-04: 500 mL via INTRAVENOUS

## 2024-09-04 MED ORDER — VANCOMYCIN HCL 1250 MG/250ML IV SOLN
1250.0000 mg | INTRAVENOUS | Status: DC
  Administered 2024-09-05: 1250 mg via INTRAVENOUS
  Filled 2024-09-04 (×2): qty 250

## 2024-09-04 MED ORDER — METRONIDAZOLE 500 MG/100ML IV SOLN
500.0000 mg | Freq: Two times a day (BID) | INTRAVENOUS | Status: DC
  Administered 2024-09-04 – 2024-09-06 (×4): 500 mg via INTRAVENOUS
  Filled 2024-09-04 (×4): qty 100

## 2024-09-04 MED ORDER — CLONAZEPAM 0.5 MG PO TABS
0.5000 mg | ORAL_TABLET | Freq: Every day | ORAL | Status: DC
  Administered 2024-09-04 – 2024-09-06 (×3): 0.5 mg via ORAL
  Filled 2024-09-04 (×3): qty 1

## 2024-09-04 MED ORDER — LACTATED RINGERS IV SOLN: INTRAVENOUS | Status: AC

## 2024-09-04 NOTE — Plan of Care (Signed)

## 2024-09-04 NOTE — Hospital Course (Addendum)
 48 year old female with a history of recently diagnosed breast cancer (ER-PR-HER2-)  s/p R lumpectomy, recently diagnosed anal squamous cell carcinoma currently receiving radiation and chemotherapy (last received mitomycin -C 10/16 could not receive 5FU as port had clot and PICC placed 10/15 does not accommodate this), R internal jugular thrombosis and PE on therapeutic lovenox  which has been continued here, lupus not on immunosuppression.  She initially presented with dysuria and suprapubic abdominal pain concerning for UTI. She had no fevers and her WBC was WNL. She was started on cefepime at that time. She developed soft blood pressures while in the ED and on the day of admission. She was given about 4L of IV fluid, IV steroids, and started on midodrine.  With only temporary improvement in her blood pressures.   Yesterday, she became febrile to 103. Since then she has remained intermittently febrile. And her blood pressures are remaining in the 90s sytolic.   She did get an additional 500cc bolus and was started on LR 150cc/h with minimal improvement in her blood pressure.   Her most recent set of vitals  was Temp 100.4, HR 102, BP 97/56.   Infectious work up obtained this hospitalization.   CT A/P: No findings to suggest cause of fever or hypotension.  Bcx 10/18 NGTD, UCX with mulitple species.  UCX 10/21 NG, Bcx NG <24h.   She has no obvious bleeding. And her Hgb appears at baseline.

## 2024-09-04 NOTE — Consult Note (Addendum)
 Pharmacy Antibiotic Note  Monica Gonzalez is a 48 y.o. female admitted on 08/31/2024 with a fever and unknown source for infection. She presented with fever, dysuria, and suprapubic pain. Infectious workup is ongoing. WBC count is 2.9 and temperature trending up to 103 degrees farenheit. Imaging in right lower lobe airspace disease consistent with Pneumonia. Pharmacy has been consulted for vancomycin and cefepime dosing. .  Plan: Loading dose of Vancomycin 2 g IV once followed by:  Vancomycin 1250 mg IV every 24 hours, Predicted AUC of 517. Goal AUC of 400-600 Vd 0.5, sCr 0.8. Plan to check Vancomycin levels after 4th or 5th dose.  Cefepime 2 g IV every 8 hours  Height: 4' 11 (149.9 cm) Weight: 91.2 kg (201 lb) IBW/kg (Calculated) : 43.2  Temp (24hrs), Avg:100.1 F (37.8 C), Min:97.8 F (36.6 C), Max:103 F (39.4 C)  Recent Labs  Lab 08/31/24 1416 08/31/24 1820 09/01/24 0526 09/02/24 0422 09/03/24 0615 09/03/24 1805 09/03/24 2349 09/04/24 0500 09/04/24 0530  WBC 6.5  --  3.6* 5.5 3.4*  --   --  2.8* 2.9*  CREATININE 0.72  --  0.68 0.70 0.71  --   --   --  0.80  LATICACIDVEN  --  1.1  --   --   --  2.0* 1.9  --   --     Estimated Creatinine Clearance: 83.8 mL/min (by C-G formula based on SCr of 0.8 mg/dL).    Allergies  Allergen Reactions   Prednisone Shortness Of Breath and Swelling    Patient has taken methylprednisolone  (Depo-Medrol ) and dexamethasone  (Decadron ) without any documented side effects or adverse reactions. She describes that she used to take prednisone due to her bronchial asthma and respiratory problems, but later on she received some prednisone which into locations caused her to have significant swelling and shortness of breath. For that reason, she was told to avoid the use of prednisone.   Silicone Hives and Itching    Antimicrobials this admission: 10/21 Meropenem >> x2  10/21 Cefepime >>  10/22 Vancomycin >>  Microbiology results: 10/21 BCx:  Pending  10/21 UCx: Pending    Thank you for allowing pharmacy to be a part of this patient's care.  Fredia Main, PharmD Candidate 09/04/2024 11:07 AM

## 2024-09-04 NOTE — Progress Notes (Signed)
 PROGRESS NOTE    Monica Gonzalez  FMW:[id number] DOB: [birth year] DOA: 08/31/2024 PCP: Monica Alm SQUIBB, MD  Chief Complaint  Patient presents with   Dysuria    Hospital Course:  Monica Gonzalez is a 48 y.o. 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 Patient admitted for UTI Has PICC line in Left arm  Subjective: Patient was examined at the bedside.  Continues to have dysuria and suprapubic pain with no improvement Also reports cough BP soft and mild tachycardia   Objective: Vitals:   09/04/24 1111 09/04/24 1236 09/04/24 1336 09/04/24 1510  BP: 95/60 98/63 (!) 94/51 (!) 97/56  Pulse: (!) 109 (!) 110 (!) 107 (!) 102  Resp: 18 20 19 19   Temp: (!) 103 F (39.4 C) 100.2 F (37.9 C) (!) 102 F (38.9 C) (!) 100.4 F (38 C)  TempSrc: Oral  Oral Oral  SpO2: 98% 97% 95% 96%  Weight:      Height:        Intake/Output Summary (Last 24 hours) at 09/04/2024 1512 Last data filed at 09/04/2024 1435 Gross per 24 hour  Intake 810 ml  Output 2000 ml  Net -1190 ml   Filed Weights   08/31/24 1405  Weight: 91.2 kg    Examination:  Constitutional: In no distress.  Cardiovascular: Tachycardic. No lower extremity edema  Pulmonary: Non labored breathing on room air, no wheezing or rales.   Abdominal: Soft. Non distended and non tender Musculoskeletal: Normal range of motion.     Neurological: Alert and oriented to person, place, and time. Non focal  Skin: Diffusely warm to touch. Inguinal folds with macerated skin, white gel, intergluateal cleft and more inferior region with  maceration  Assessment & Plan:  Sepsis  Fever, tachycardia, leukopenia  Initially admitted for UTI (urinary tract infection) She had no fever on admission and was normotensive.  She was started on cefepime. UCX showed multiple species.   On HD 3 she developed a fever up to 103, HR 110s, and BP 107/61. Ucx was obtained, Bcx were obtained. RPP which was neg was obtained.  Patient's antibiotics were broadened to meropenem.  -She underwent CT A/P 10/21 which showed moderate retained stool throughout the colon consistent with constipation. ? RLL pna. No other findings to suggest etiology of patients fevers.  Procalcitonin 0.15, noted to be lymphopenic, ALC 0.2K/uL.  Negative RPP, Negative HIV.   Also considered benzodiazepine withdrawal as contributing to patients symptoms.   Plan:  Will add vancomycin as patient continues to have fevers Monitor fever curve and WBC count IV fluids, monitor urine output  F/u repeat Bcx and Ucx May need ID c/s   Hypotension  -On Midodrine 10 tid -S/p bolus 500cc this AM -IV fluids resumed at 150cc/h.  Patient remains tachycardic and blood pressures in the 90s.  Will reach out to ccm given blood pressures remain low despite significant amount of fluid around 4.5L since admission.    Cough - CXR negative - RVP negative  -Mucinex , flutter   Macrocytic anemia    Latest Ref Rng & Units 09/04/2024    5:30 AM 09/04/2024    5:00 AM 09/03/2024    6:15 AM  CBC  WBC  4.0 - 10.5 K/uL 2.9  2.8  3.4   Hemoglobin 12.0 - 15.0 g/dL 9.6  9.6  8.4   Hematocrit 36.0 - 46.0 % 30.2  29.6  26.5   Platelets 150 - 400 K/uL 218  219  208    Hgb stable.  B12 decreased, Start supplementation F/u Fe panel    Hx of pulmonary embolism (HCC) and  Internal jugular vein thrombosis, right  -continue home Lovenox  80 mg bid   Asthma: Stable -Bronchodilators, as needed Mucinex    History of Lupus (systemic lupus erythematosus) (HCC): currently not taking medications.   Stable per patient. 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  Patient is following up with Monica Gonzalez of oncology.  Currently on chemotherapy and radiation therapy for anal cancer. 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  - Encourage losing weight - Exercise and healthy diet  -- Able to move around at baseline, slow   DVT prophylaxis: Lovenox    Code Status: Full Code Disposition:  TBD  Consultants:  None  Procedures:  None  Antimicrobials:  Anti-infectives (From admission, onward)    Start     Dose/Rate Route Frequency Ordered Stop   09/05/24 1200  vancomycin (VANCOREADY) IVPB 1250 mg/250 mL        1,250 mg 166.7 mL/hr over 90 Minutes Intravenous Every 24 hours 09/04/24 1104     09/04/24 1400  ceFEPIme (MAXIPIME) 2 g in sodium chloride  0.9 % 100 mL IVPB        2 g 200 mL/hr over 30 Minutes Intravenous Every 8 hours 09/04/24 1102     09/04/24 1200  vancomycin (VANCOREADY) IVPB 2000 mg/400 mL        2,000 mg 200 mL/hr over 120 Minutes Intravenous  Once 09/04/24 1102 09/04/24 1328   09/03/24 2200  meropenem (MERREM) 1 g in sodium chloride  0.9 % 100 mL IVPB  Status:  Discontinued        1 g 200 mL/hr over 30 Minutes Intravenous Every 8 hours 09/03/24 1905 09/04/24 1025   09/01/24 0600  cefTRIAXone  (ROCEPHIN ) 2 g in sodium chloride  0.9 % 100 mL IVPB  Status:  Discontinued        2 g 200 mL/hr over 30 Minutes Intravenous Every 24 hours 08/31/24 1938 09/01/24 0103   09/01/24 0600  ceFEPIme (MAXIPIME) 2 g in sodium  chloride 0.9 % 100 mL IVPB  Status:  Discontinued        2 g 200 mL/hr over 30 Minutes Intravenous Every 12 hours 09/01/24 0119 09/03/24 1905   09/01/24 0200  ceFEPIme (MAXIPIME) 2 g in sodium chloride  0.9 % 100 mL IVPB  Status:  Discontinued        2 g 200 mL/hr over 30 Minutes Intravenous Every 12 hours 09/01/24 0118 09/01/24 0119   08/31/24 1845  ceFEPIme (MAXIPIME) 2 g in sodium chloride  0.9 % 100 mL IVPB        2 g 200 mL/hr over 30 Minutes Intravenous  Once 08/31/24 1835 08/31/24 1932  Data Reviewed: I have personally reviewed following labs and imaging studies CBC: Recent Labs  Lab 08/29/24 1437 08/31/24 1416 09/01/24 0526 09/02/24 0422 09/03/24 0615 09/04/24 0500 09/04/24 0530  WBC 4.8   < > 3.6* 5.5 3.4* 2.8* 2.9*  NEUTROABS 3.5  --   --   --   --  2.3  --   HGB 10.1*   < > 9.5* 8.5* 8.4* 9.6* 9.6*  HCT 31.1*   < > 30.0* 27.4* 26.5* 29.6* 30.2*  MCV 97.5   < > 101.4* 103.4* 101.1* 99.3 99.7  PLT 223   < > 194 206 208 219 218   < > = values in this interval not displayed.   Basic Metabolic Panel: Recent Labs  Lab 08/31/24 1416 09/01/24 0526 09/02/24 0422 09/03/24 0615 09/04/24 0530  NA 139 138 143 140 142  K 3.5 3.6 4.0 3.7 3.8  CL 101 106 107 104 101  CO2 24 23 27 26 27   GLUCOSE 98 175* 120* 88 103*  BUN 6 6 9 7 7   CREATININE 0.72 0.68 0.70 0.71 0.80  CALCIUM 8.4* 8.3* 8.3* 8.1* 8.5*  MG  --   --  1.9  --   --   PHOS  --   --  3.4  --   --    GFR: Estimated Creatinine Clearance: 83.8 mL/min (by C-G formula based on SCr of 0.8 mg/dL). Liver Function Tests: Recent Labs  Lab 08/29/24 1437  AST 114*  ALT 105*  ALKPHOS 109  BILITOT 0.9  PROT 5.7*  ALBUMIN 2.8*   CBG: No results for input(s): GLUCAP in the last 168 hours.  Recent Results (from the past 240 hours)  Urine Culture (for pregnant, neutropenic or urologic patients or patients with an indwelling urinary catheter)     Status: Abnormal   Collection Time: 08/31/24  2:16 PM    Specimen: Urine, Clean Catch  Result Value Ref Range Status   Specimen Description   Final    URINE, CLEAN CATCH Performed at [facility], [address] [place], [place] [address]    Special Requests   Final    NONE Performed at [facility], [address] [place], [place] [address]    Culture MULTIPLE SPECIES PRESENT, SUGGEST RECOLLECTION (A)  Final   Report Status 09/02/2024 FINAL  Final  Blood Culture (routine x 2)     Status: None (Preliminary result)   Collection Time: 08/31/24  3:38 PM   Specimen: BLOOD  Result Value Ref Range Status   Specimen Description BLOOD BLOOD RIGHT ARM  Final   Special Requests   Final    BOTTLES DRAWN AEROBIC AND ANAEROBIC Blood Culture adequate volume   Culture   Final    NO GROWTH 4 DAYS Performed at [facility], [address] [place], [place] [address]    Report Status PENDING  Incomplete  Blood Culture (routine x 2)     Status: None (Preliminary result)   Collection Time: 08/31/24  6:40 PM   Specimen: BLOOD  Result Value Ref Range Status   Specimen Description BLOOD BLOOD LEFT WRIST  Final   Special Requests   Final    BOTTLES DRAWN AEROBIC AND ANAEROBIC Blood Culture adequate volume   Culture   Final    NO GROWTH 4 DAYS Performed at [facility], [address] [place], [place] [address]    Report Status PENDING  Incomplete  Respiratory (~20 pathogens) panel by PCR  Status: None   Collection Time: 09/03/24 10:25 AM   Specimen: Nasopharyngeal Swab; Respiratory  Result Value Ref Range Status   Adenovirus NOT DETECTED NOT DETECTED Final   Coronavirus 229E NOT DETECTED NOT DETECTED Final    Comment: (NOTE) The Coronavirus on the Respiratory Panel, DOES NOT test for the novel  Coronavirus (2019 nCoV)    Coronavirus HKU1 NOT DETECTED NOT DETECTED Final   Coronavirus NL63 NOT DETECTED NOT DETECTED Final   Coronavirus OC43 NOT DETECTED NOT DETECTED Final   Metapneumovirus NOT DETECTED  NOT DETECTED Final   Rhinovirus / Enterovirus NOT DETECTED NOT DETECTED Final   Influenza A NOT DETECTED NOT DETECTED Final   Influenza B NOT DETECTED NOT DETECTED Final   Parainfluenza Virus 1 NOT DETECTED NOT DETECTED Final   Parainfluenza Virus 2 NOT DETECTED NOT DETECTED Final   Parainfluenza Virus 3 NOT DETECTED NOT DETECTED Final   Parainfluenza Virus 4 NOT DETECTED NOT DETECTED Final   Respiratory Syncytial Virus NOT DETECTED NOT DETECTED Final   Bordetella pertussis NOT DETECTED NOT DETECTED Final   Bordetella Parapertussis NOT DETECTED NOT DETECTED Final   Chlamydophila pneumoniae NOT DETECTED NOT DETECTED Final   Mycoplasma pneumoniae NOT DETECTED NOT DETECTED Final    Comment: Performed at [facility], [address], [place], [place] [address]  Urine Culture (for pregnant, neutropenic or urologic patients or patients with an indwelling urinary catheter)     Status: None   Collection Time: 09/03/24 11:55 AM   Specimen: Urine, Clean Catch  Result Value Ref Range Status   Specimen Description   Final    URINE, CLEAN CATCH Performed at [facility], [address], [place], [place] [address]    Special Requests   Final    NONE Performed at [facility], [address] [place], [place] [address]    Culture   Final    NO GROWTH Performed at [facility], [address], [place], [place] [address]    Report Status 09/04/2024 FINAL  Final  Culture, blood (single) w Reflex to ID Panel     Status: None (Preliminary result)   Collection Time: 09/03/24  7:46 PM   Specimen: BLOOD  Result Value Ref Range Status   Specimen Description BLOOD BLOOD RIGHT HAND  Final   Special Requests   Final    BOTTLES DRAWN AEROBIC AND ANAEROBIC Blood Culture adequate volume   Culture   Final    NO GROWTH < 24 HOURS Performed at [facility], [address] [place], [place] [address]    Report Status PENDING  Incomplete     Radiology Studies: CT  ABDOMEN PELVIS W CONTRAST Result Date: 09/03/2024 CLINICAL DATA:  Urinary tract infection, right breast cancer EXAM: CT ABDOMEN AND PELVIS WITH CONTRAST TECHNIQUE: Multidetector CT imaging of the abdomen and pelvis was performed using the standard protocol following bolus administration of intravenous contrast. RADIATION DOSE REDUCTION: This exam was performed according to the departmental dose-optimization program which includes automated exposure control, adjustment of the mA and/or kV according to patient size and/or use of iterative reconstruction technique. CONTRAST:  OMNIPAQUE  IOHEXOL  300 MG/ML  SOLN COMPARISON:  08/17/2024 FINDINGS: Lower chest: Right lower lobe airspace disease consistent with pneumonia. Trace right effusion. No pneumothorax. Skin thickening within the right breast likely post therapeutic in this patient with a known prior history of right breast cancer and lumpectomy. Hepatobiliary: No focal liver abnormality is seen. No gallstones, gallbladder wall thickening, or biliary  dilatation. Pancreas: Unremarkable. No pancreatic ductal dilatation or surrounding inflammatory changes. Spleen: Normal in size without focal abnormality. Adrenals/Urinary Tract: Adrenal glands are unremarkable. Kidneys are normal, without renal calculi, focal lesion, or hydronephrosis. Bladder is unremarkable. Stomach/Bowel: No bowel obstruction or ileus. Moderate stool within the colon consistent with constipation. Normal appendix right lower quadrant. No bowel wall thickening or inflammatory change. Vascular/Lymphatic: No significant vascular findings are present. No enlarged abdominal or pelvic lymph nodes. Reproductive: Uterus and bilateral adnexa are unremarkable. Other: Trace pelvic free fluid, nonspecific. No free intraperitoneal gas. No abdominal wall hernia. Musculoskeletal: Sequela of multiple subcutaneous injections within the anterior abdominal wall. No acute or destructive bony abnormalities.  Reconstructed images demonstrate no additional findings. IMPRESSION: 1. Right lower lobe pneumonia. 2. Trace pelvic free fluid, likely physiologic. 3. Moderate retained stool throughout the colon consistent with constipation. 4. Likely post therapeutic skin thickening within the right breast in this patient with a known history of right breast cancer. Electronically Signed   By: Ozell Daring M.D.   On: 09/03/2024 20:39   DG Chest 1 View Result Date: 09/03/2024 CLINICAL DATA:  Pneumonia. EXAM: DG CHEST 1V COMPARISON:  08/31/2024 FINDINGS: Left-sided PICC line unchanged with tip over the SVC. Lungs are hypoinflated with minimal stable prominence of the central pulmonary vasculature. No acute consolidation or effusion. Cardiomediastinal silhouette and remainder the exam is unchanged. IMPRESSION: Hypoinflation without acute cardiopulmonary disease. Electronically Signed   By: Toribio Agreste M.D.   On: 09/03/2024 08:54    Scheduled Meds:  acetaminophen   650 mg Oral Q4H   budesonide   0.25 mg Nebulization BID   busPIRone   5 mg Oral BID   Chlorhexidine  Gluconate Cloth  6 each Topical Daily   clonazePAM  0.5 mg Oral Daily   vitamin B-12  500 mcg Oral Daily   DULoxetine   30 mg Oral QHS   enoxaparin   80 mg Subcutaneous Q12H   fentaNYL   1 patch Transdermal Q72H   ferrous sulfate  325 mg Oral Daily   gabapentin   600 mg Oral TID   hydrocortisone  1 Application Rectal BID   midodrine  10 mg Oral TID WC   multivitamin with minerals  1 tablet Oral Daily   OLANZapine  5 mg Oral QHS   pantoprazole  40 mg Oral BID   sodium chloride  flush  10-40 mL Intracatheter Q12H   sucralfate   1 g Oral TID WC & HS   Continuous Infusions:  ceFEPime (MAXIPIME) IV 2 g (09/04/24 1410)   lactated ringers  100 mL/hr at 09/04/24 1408   [START ON 09/05/2024] vancomycin       LOS: 3 days  MDM: Patient is high risk for one or more organ failure.  They necessitate ongoing hospitalization for continued IV therapies and  subsequent lab monitoring. Total time spent interpreting labs and vitals, reviewing the medical record, coordinating care amongst consultants and care team members, directly assessing and discussing care with the patient and/or family: Alban Pepper, MD [facility]  To contact the attending physician between 7A-7P please use Epic Chat. To contact the covering physician during after hours 7P-7A, please review Amion.  09/04/2024, 3:12 PM   *This document has been created with the assistance of dictation software. Please excuse typographical errors. *

## 2024-09-05 ENCOUNTER — Encounter (INDEPENDENT_AMBULATORY_CARE_PROVIDER_SITE_OTHER): Payer: Self-pay

## 2024-09-05 ENCOUNTER — Encounter: Payer: Self-pay | Admitting: Oncology

## 2024-09-05 ENCOUNTER — Ambulatory Visit

## 2024-09-05 ENCOUNTER — Encounter: Payer: Self-pay | Admitting: Pulmonary Disease

## 2024-09-05 DIAGNOSIS — J4520 Mild intermittent asthma, uncomplicated: Secondary | ICD-10-CM | POA: Diagnosis not present

## 2024-09-05 DIAGNOSIS — C210 Malignant neoplasm of anus, unspecified: Secondary | ICD-10-CM | POA: Diagnosis not present

## 2024-09-05 DIAGNOSIS — I82C11 Acute embolism and thrombosis of right internal jugular vein: Secondary | ICD-10-CM | POA: Diagnosis not present

## 2024-09-05 DIAGNOSIS — N3000 Acute cystitis without hematuria: Secondary | ICD-10-CM | POA: Diagnosis not present

## 2024-09-05 LAB — CBC WITH DIFFERENTIAL/PLATELET
Abs Immature Granulocytes: 0.03 K/uL (ref 0.00–0.07)
Basophils Absolute: 0 K/uL (ref 0.0–0.1)
Basophils Relative: 1 %
Eosinophils Absolute: 0 K/uL (ref 0.0–0.5)
Eosinophils Relative: 1 %
HCT: 28.5 % — ABNORMAL LOW (ref 36.0–46.0)
Hemoglobin: 9 g/dL — ABNORMAL LOW (ref 12.0–15.0)
Immature Granulocytes: 2 %
Lymphocytes Relative: 5 %
Lymphs Abs: 0.1 K/uL — ABNORMAL LOW (ref 0.7–4.0)
MCH: 32 pg (ref 26.0–34.0)
MCHC: 31.6 g/dL (ref 30.0–36.0)
MCV: 101.4 fL — ABNORMAL HIGH (ref 80.0–100.0)
Monocytes Absolute: 0.2 K/uL (ref 0.1–1.0)
Monocytes Relative: 8 %
Neutro Abs: 1.7 K/uL (ref 1.7–7.7)
Neutrophils Relative %: 83 %
Platelets: 188 K/uL (ref 150–400)
RBC: 2.81 MIL/uL — ABNORMAL LOW (ref 3.87–5.11)
RDW: 18.1 % — ABNORMAL HIGH (ref 11.5–15.5)
WBC: 2.1 K/uL — ABNORMAL LOW (ref 4.0–10.5)
nRBC: 0 % (ref 0.0–0.2)

## 2024-09-05 LAB — BASIC METABOLIC PANEL WITH GFR
Anion gap: 9 (ref 5–15)
BUN: 10 mg/dL (ref 6–20)
CO2: 28 mmol/L (ref 22–32)
Calcium: 8.3 mg/dL — ABNORMAL LOW (ref 8.9–10.3)
Chloride: 105 mmol/L (ref 98–111)
Creatinine, Ser: 0.77 mg/dL (ref 0.44–1.00)
GFR, Estimated: >60 mL/min (ref >60)
Glucose, Bld: 146 mg/dL — ABNORMAL HIGH (ref 70–99)
Potassium: 3.6 mmol/L (ref 3.5–5.1)
Sodium: 142 mmol/L (ref 135–145)

## 2024-09-05 LAB — IRON AND TIBC
Iron: 35 ug/dL (ref 28–170)
Saturation Ratios: 16 % (ref 10.4–31.8)
TIBC: 213 ug/dL — ABNORMAL LOW (ref 250–450)
UIBC: 178 ug/dL

## 2024-09-05 LAB — CULTURE, BLOOD (ROUTINE X 2)
Culture: NO GROWTH
Special Requests: ADEQUATE

## 2024-09-05 LAB — FERRITIN: Ferritin: 214 ng/mL (ref 11–307)

## 2024-09-05 MED ORDER — DOCUSATE SODIUM 100 MG PO CAPS
100.0000 mg | ORAL_CAPSULE | Freq: Every day | ORAL | Status: DC
  Administered 2024-09-05 – 2024-09-06 (×2): 100 mg via ORAL
  Filled 2024-09-05 (×2): qty 1

## 2024-09-05 NOTE — Progress Notes (Signed)
 PROGRESS NOTE    Monica Gonzalez  FMW:[id number] DOB: 05/27/1976 DOA: 08/31/2024 PCP: Epifanio Alm SQUIBB, MD  Chief Complaint  Patient presents with   Dysuria    Hospital Course:  Monica Gonzalez is a 48 y.o. 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 Patient admitted for UTI Has PICC line in Left arm  10/23: transfer to med-surg, DC tele, wean off O2  Subjective:  Some cough, weak, concerned about Hypoxia  Objective: Vitals:   09/05/24 0757 09/05/24 1212 09/05/24 1405 09/05/24 1529  BP: 113/74 92/62 (!) 93/56 117/65  Pulse: 83 92 83 85  Resp: 19  17 18   Temp: 97.8 F (36.6 C) 98 F (36.7 C) 97.9 F (36.6 C) 98.7 F (37.1 C)  TempSrc: Oral   Oral  SpO2: 96% 95% 99% 93%  Weight:      Height:        Intake/Output Summary (Last 24 hours) at 09/05/2024 2012 Last data filed at 09/05/2024 1925 Gross per 24 hour  Intake 2673.04 ml  Output 300 ml  Net 2373.04 ml   Filed Weights   08/31/24 1405  Weight: 91.2 kg    Examination:  Constitutional: In no distress.  Cardiovascular: S1, S2 normal. No lower extremity edema  Pulmonary: Non labored breathing on room air, no wheezing or rales.   Abdominal: Soft. Non distended and non tender Musculoskeletal: Normal range of motion.     Neurological: Alert and oriented to person, place, and time. Non focal  Skin: Diffusely warm to touch. Inguinal folds with macerated skin, white gel, intergluateal cleft and more inferior region with maceration  Assessment & Plan:  Sepsis due to pneumonia Fever, tachycardia, leukopenia  Initially admitted  for UTI (urinary tract infection) She had no fever on admission and was normotensive.  She was started on cefepime. UCX showed multiple species.   On HD 3 she developed a fever up to 103, HR 110s, and BP 107/61. Ucx was obtained, Bcx were obtained. RPP which was neg was obtained.  Patient's antibiotics were broadened to meropenem.  -She underwent CT A/P 10/21 which showed moderate retained stool throughout the colon consistent with constipation. RLL pna.  Procalcitonin 0.15, noted to be lymphopenic, ALC 0.2K/uL.  Negative RPP, Negative HIV.  - continue cefepime, Vanco and flagyl Monitor fever curve and WBC count  Hypotension  -Continue Midodrine 10 tid  Macrocytic anemia    Latest Ref Rng & Units 09/05/2024    5:45 AM 09/04/2024    5:30 AM 09/04/2024    5:00 AM  CBC  WBC 4.0 - 10.5 K/uL 2.1  2.9  2.8   Hemoglobin 12.0 - 15.0 g/dL 9.0  9.6  9.6   Hematocrit 36.0 - 46.0 % 28.5  30.2  29.6   Platelets 150 - 400 K/uL 188  218  219    Hgb stable.  B12 decreased, Started supplementation  Hx of pulmonary embolism (HCC) and  Internal jugular vein thrombosis, right  -continue home Lovenox  80 mg bid   Asthma: Stable -Bronchodilators, as needed Mucinex    History of Lupus (systemic lupus erythematosus) (HCC): currently not taking medications.  Stable per patient. 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  Patient is following up with Dr. Babara of oncology.  Currently on chemotherapy and radiation therapy for anal cancer. - Temporarily hold Xeloda due to ongoing infection - Follow-up with Dr. 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  - Encourage losing weight - Exercise and healthy diet  -- Able to move around at baseline, slow   DVT prophylaxis: Lovenox    Code Status: Full Code Disposition:  Home tomorrow if Afebrile    Antimicrobials:  Anti-infectives (From admission, onward)    Start     Dose/Rate Route Frequency Ordered Stop   09/05/24 1200  vancomycin (VANCOREADY) IVPB 1250 mg/250 mL        1,250 mg 166.7 mL/hr over 90 Minutes Intravenous Every 24 hours 09/04/24 1104     09/04/24 1800  metroNIDAZOLE (FLAGYL) IVPB 500 mg        500 mg 100 mL/hr over 60 Minutes Intravenous Every 12 hours 09/04/24 1535     09/04/24 1400  ceFEPIme (MAXIPIME) 2 g in sodium chloride  0.9 % 100 mL IVPB        2 g 200 mL/hr over 30 Minutes Intravenous Every 8 hours 09/04/24 1102     09/04/24 1200  vancomycin (VANCOREADY) IVPB 2000 mg/400 mL        2,000 mg 200 mL/hr over 120 Minutes Intravenous  Once 09/04/24 1102 09/04/24 1333   09/03/24 2200  meropenem (MERREM) 1 g in sodium chloride  0.9 % 100 mL IVPB  Status:  Discontinued        1 g 200 mL/hr over 30 Minutes Intravenous Every 8 hours 09/03/24 1905 09/04/24 1025   09/01/24 0600  cefTRIAXone  (ROCEPHIN ) 2 g in sodium chloride  0.9 % 100 mL IVPB  Status:  Discontinued        2 g 200 mL/hr over 30 Minutes Intravenous Every 24 hours 08/31/24 1938 09/01/24 0103   09/01/24 0600  ceFEPIme (MAXIPIME) 2 g in sodium chloride  0.9 % 100 mL IVPB  Status:  Discontinued        2 g 200 mL/hr over 30 Minutes Intravenous Every 12 hours 09/01/24 0119 09/03/24 1905   09/01/24 0200  ceFEPIme (MAXIPIME) 2 g in sodium chloride  0.9 % 100 mL IVPB  Status:  Discontinued        2 g 200 mL/hr over 30 Minutes Intravenous Every 12 hours 09/01/24 0118 09/01/24 0119   08/31/24 1845  ceFEPIme (MAXIPIME) 2 g in sodium chloride  0.9 % 100 mL IVPB        2 g 200 mL/hr over 30 Minutes Intravenous  Once 08/31/24 1835 08/31/24 1932       Data Reviewed: I  have personally reviewed following labs and imaging studies CBC: Recent Labs  Lab 09/02/24 0422 09/03/24 0615 09/04/24 0500 09/04/24 0530 09/05/24 0545  WBC 5.5 3.4* 2.8* 2.9* 2.1*  NEUTROABS  --   --  2.3  --  1.7  HGB 8.5* 8.4* 9.6* 9.6* 9.0*  HCT 27.4* 26.5* 29.6* 30.2* 28.5*  MCV 103.4* 101.1* 99.3 99.7 101.4*  PLT 206 208 219 218 188   Basic Metabolic Panel: Recent Labs  Lab 09/01/24 0526 09/02/24  0422 09/03/24 0615 09/04/24 0530 09/05/24 0545  NA 138 143 140 142 142  K 3.6 4.0 3.7 3.8 3.6  CL 106 107 104 101 105  CO2 23 27 26 27 28   GLUCOSE 175* 120* 88 103* 146*  BUN 6 9 7 7 10   CREATININE 0.68 0.70 0.71 0.80 0.77  CALCIUM 8.3* 8.3* 8.1* 8.5* 8.3*  MG  --  1.9  --   --   --   PHOS  --  3.4  --   --   --     CBG: No results for input(s): GLUCAP in the last 168 hours.  Recent Results (from the past 240 hours)  Urine Culture (for pregnant, neutropenic or urologic patients or patients with an indwelling urinary catheter)     Status: Abnormal   Collection Time: 08/31/24  2:16 PM   Specimen: Urine, Clean Catch  Result Value Ref Range Status   Specimen Description   Final    URINE, CLEAN CATCH Performed at [facility], [address] [place], [place] [address]    Special Requests   Final    NONE Performed at [facility], [address] [place], [place] [address]    Culture MULTIPLE SPECIES PRESENT, SUGGEST RECOLLECTION (A)  Final   Report Status 09/02/2024 FINAL  Final  Blood Culture (routine x 2)     Status: None   Collection Time: 08/31/24  3:38 PM   Specimen: BLOOD  Result Value Ref Range Status   Specimen Description BLOOD BLOOD RIGHT ARM  Final   Special Requests   Final    BOTTLES DRAWN AEROBIC AND ANAEROBIC Blood Culture adequate volume   Culture   Final    NO GROWTH 5 DAYS Performed at [facility], [address], [place], [place] [address]    Report Status 09/05/2024 FINAL  Final  Blood Culture (routine x  2)     Status: None (Preliminary result)   Collection Time: 08/31/24  6:40 PM   Specimen: BLOOD  Result Value Ref Range Status   Specimen Description   Final    BLOOD BLOOD LEFT WRIST Performed at [facility], [address], [place], [place] [address]    Special Requests   Final    BOTTLES DRAWN AEROBIC AND ANAEROBIC Blood Culture adequate volume Performed at [facility], [address], [place], [place] [address]    Culture  Setup Time   Final    GRAM POSITIVE RODS ANAEROBIC BOTTLE ONLY CRITICAL RESULT CALLED TO, READ BACK BY AND VERIFIED WITH: JASON ROBBINS 09/05/24 0500 MW Performed at [facility], [address], [place], [place] [address]    Culture   Final    CULTURE REINCUBATED FOR BETTER GROWTH Performed at [facility], [address] [place], [place] [address]    Report Status PENDING  Incomplete  Respiratory (~20 pathogens) panel by PCR     Status: None   Collection Time: 09/03/24 10:25 AM   Specimen: Nasopharyngeal Swab; Respiratory  Result Value Ref Range Status   Adenovirus NOT DETECTED NOT DETECTED Final   Coronavirus 229E NOT DETECTED NOT DETECTED Final    Comment: (NOTE) The Coronavirus on the Respiratory Panel, DOES NOT test for the novel  Coronavirus (2019 nCoV)    Coronavirus HKU1 NOT DETECTED NOT DETECTED Final   Coronavirus NL63 NOT DETECTED NOT DETECTED Final   Coronavirus OC43 NOT DETECTED NOT DETECTED Final   Metapneumovirus NOT DETECTED NOT DETECTED Final  Rhinovirus / Enterovirus NOT DETECTED NOT DETECTED Final   Influenza A NOT DETECTED NOT DETECTED Final   Influenza B NOT DETECTED NOT DETECTED Final   Parainfluenza Virus 1 NOT DETECTED NOT DETECTED Final   Parainfluenza Virus 2 NOT DETECTED NOT DETECTED Final   Parainfluenza Virus 3 NOT DETECTED NOT DETECTED Final   Parainfluenza Virus 4 NOT DETECTED NOT DETECTED Final   Respiratory Syncytial Virus NOT DETECTED NOT DETECTED Final   Bordetella pertussis  NOT DETECTED NOT DETECTED Final   Bordetella Parapertussis NOT DETECTED NOT DETECTED Final   Chlamydophila pneumoniae NOT DETECTED NOT DETECTED Final   Mycoplasma pneumoniae NOT DETECTED NOT DETECTED Final    Comment: Performed at [facility], [address] [place], [place] [address]  Urine Culture (for pregnant, neutropenic or urologic patients or patients with an indwelling urinary catheter)     Status: None   Collection Time: 09/03/24 11:55 AM   Specimen: Urine, Clean Catch  Result Value Ref Range Status   Specimen Description   Final    URINE, CLEAN CATCH Performed at [facility], [address] [place], [place] [address]    Special Requests   Final    NONE Performed at [facility], [address], [place], [place] [address]    Culture   Final    NO GROWTH Performed at [facility], [address], [place], [place] [address]    Report Status 09/04/2024 FINAL  Final  Culture, blood (single) w Reflex to ID Panel     Status: None (Preliminary result)   Collection Time: 09/03/24  7:46 PM   Specimen: BLOOD  Result Value Ref Range Status   Specimen Description BLOOD BLOOD RIGHT HAND  Final   Special Requests   Final    BOTTLES DRAWN AEROBIC AND ANAEROBIC Blood Culture adequate volume   Culture   Final    NO GROWTH 2 DAYS Performed at [facility], [address] [place], [place] [address]    Report Status PENDING  Incomplete     Radiology Studies: US  Venous Img Lower Unilateral Right (DVT) Result Date: 09/05/2024 CLINICAL DATA:  Right lower extremity pain, edema and warmth. History of prior DVT. EXAM: RIGHT LOWER EXTREMITY VENOUS DOPPLER ULTRASOUND TECHNIQUE: Gray-scale sonography with graded compression, as well as color Doppler and duplex ultrasound were performed to evaluate the lower extremity deep venous systems from the level of the common femoral vein and including the common femoral, femoral, profunda femoral, popliteal and  calf veins including the posterior tibial, peroneal and gastrocnemius veins when visible. The superficial great saphenous vein was also interrogated. Spectral Doppler was utilized to evaluate flow at rest and with distal augmentation maneuvers in the common femoral, femoral and popliteal veins. COMPARISON:  None Available. FINDINGS: Contralateral Common Femoral Vein: Respiratory phasicity is normal and symmetric with the symptomatic side. No evidence of thrombus. Normal compressibility. Common Femoral Vein: No evidence of thrombus. Normal compressibility, respiratory phasicity and response to augmentation. Saphenofemoral Junction: No evidence of thrombus. Normal compressibility and flow on color Doppler imaging. Profunda Femoral Vein: No evidence of thrombus. Normal compressibility and flow on color Doppler imaging. Femoral Vein: No evidence of thrombus. Normal compressibility, respiratory phasicity and response to augmentation. Popliteal Vein: No evidence of thrombus. Normal compressibility, respiratory phasicity and response to augmentation. Calf Veins: No evidence of thrombus. Normal compressibility and flow on color Doppler imaging. Superficial Great Saphenous Vein: No evidence of thrombus. Normal compressibility. Venous Reflux:  None. Other Findings:  No evidence of superficial thrombophlebitis or abnormal fluid collection. IMPRESSION: No evidence of right lower extremity deep venous thrombosis. Electronically Signed   By: Marcey Moan M.D.   On: 09/05/2024 07:49    Scheduled Meds:  acetaminophen   650 mg Oral Q4H   budesonide   0.25 mg Nebulization BID   busPIRone   5 mg Oral BID   Chlorhexidine  Gluconate Cloth  6 each Topical Daily   clonazePAM  0.5 mg Oral Daily   vitamin B-12  500 mcg Oral Daily   docusate sodium   100 mg Oral Daily   DULoxetine   30 mg Oral QHS   enoxaparin   80 mg Subcutaneous Q12H   fentaNYL   1 patch Transdermal Q72H   ferrous sulfate  325 mg Oral Daily   gabapentin   600 mg  Oral TID   hydrocortisone  1 Application Rectal BID   midodrine  10 mg Oral TID WC   multivitamin with minerals  1 tablet Oral Daily   OLANZapine  5 mg Oral QHS   pantoprazole  40 mg Oral BID   sodium chloride  flush  10-40 mL Intracatheter Q12H   sucralfate   1 g Oral TID WC & HS   Continuous Infusions:  ceFEPime (MAXIPIME) IV 2 g (09/05/24 1532)   metronidazole 500 mg (09/05/24 1715)   vancomycin 1,250 mg (09/05/24 1216)     LOS: 4 days  MDM: Patient is high risk for one or more organ failure.  They necessitate ongoing hospitalization for continued IV therapies and subsequent lab monitoring. Total time spent interpreting labs and vitals, reviewing the medical record, coordinating care amongst consultants and care team members, directly assessing and discussing care with the patient and/or family: Cresencio Fairly, MD [facility] Time spent: 35 mins To contact the attending physician between 7A-7P please use Epic Chat. To contact the covering physician during after hours 7P-7A, please review Amion.  09/05/2024, 8:12 PM   *This document has been created with the assistance of dictation software. Please excuse typographical errors. *

## 2024-09-05 NOTE — Telephone Encounter (Signed)
 Thanks for letting us  know.  If pulmonary consultation is needed Dr. Isaiah is on-call and hospitalist can place consult.

## 2024-09-05 NOTE — Plan of Care (Signed)
  Problem: Education: Goal: Knowledge of General Education information will improve Description: Including pain rating scale, medication(s)/side effects and non-pharmacologic comfort measures Outcome: Progressing   Problem: Clinical Measurements: Goal: Ability to maintain clinical measurements within normal limits will improve Outcome: Progressing   Problem: Activity: Goal: Risk for activity intolerance will decrease Outcome: Progressing   Problem: Nutrition: Goal: Adequate nutrition will be maintained Outcome: Progressing   Problem: Elimination: Goal: Will not experience complications related to bowel motility Outcome: Progressing Goal: Will not experience complications related to urinary retention Outcome: Progressing   Problem: Pain Managment: Goal: General experience of comfort will improve and/or be controlled Outcome: Progressing   Problem: Safety: Goal: Ability to remain free from injury will improve Outcome: Progressing   Problem: Skin Integrity: Goal: Risk for impaired skin integrity will decrease Outcome: Progressing

## 2024-09-05 NOTE — Progress Notes (Signed)
 PHARMACY - PHYSICIAN COMMUNICATION CRITICAL VALUE ALERT - BLOOD CULTURE IDENTIFICATION (BCID)  Monica Gonzalez is an 48 y.o. female who presented to [facility] on 08/31/2024 with a chief complaint of UTI.    Assessment:  Gram positive rods in 1 of 4 bottles, BCID does not ID GPR so sample sent to [facility] for further workup.   Most likely a contaminant.   (include suspected source if known)  Name of physician (or Provider) Contacted: Cle[place]  Current antibiotics: Vanc, Cefepime, metronidazole   Changes to prescribed antibiotics recommended:  Patient is on recommended antibiotics - No changes needed  No results found for this or any previous visit.  Shadee Rathod D 09/05/2024  5:20 AM

## 2024-09-06 ENCOUNTER — Ambulatory Visit

## 2024-09-06 ENCOUNTER — Other Ambulatory Visit: Payer: Self-pay

## 2024-09-06 ENCOUNTER — Emergency Department

## 2024-09-06 ENCOUNTER — Encounter: Payer: Self-pay | Admitting: Oncology

## 2024-09-06 ENCOUNTER — Inpatient Hospital Stay
Admission: EM | Admit: 2024-09-06 | Discharge: 2024-09-14 | DRG: 871 | Disposition: A | Discharge status: Home / Self Care | Attending: Internal Medicine | Admitting: Internal Medicine

## 2024-09-06 ENCOUNTER — Ambulatory Visit: Admitting: Pulmonary Disease

## 2024-09-06 DIAGNOSIS — N63 Unspecified lump in unspecified breast: Secondary | ICD-10-CM | POA: Diagnosis not present

## 2024-09-06 DIAGNOSIS — G894 Chronic pain syndrome: Secondary | ICD-10-CM | POA: Diagnosis present

## 2024-09-06 DIAGNOSIS — Z79891 Long term (current) use of opiate analgesic: Secondary | ICD-10-CM

## 2024-09-06 DIAGNOSIS — C210 Malignant neoplasm of anus, unspecified: Secondary | ICD-10-CM | POA: Diagnosis present

## 2024-09-06 DIAGNOSIS — F418 Other specified anxiety disorders: Secondary | ICD-10-CM | POA: Diagnosis not present

## 2024-09-06 DIAGNOSIS — J188 Other pneumonia, unspecified organism: Secondary | ICD-10-CM

## 2024-09-06 DIAGNOSIS — Z923 Personal history of irradiation: Secondary | ICD-10-CM

## 2024-09-06 DIAGNOSIS — Z6841 Body Mass Index (BMI) 40.0 and over, adult: Secondary | ICD-10-CM

## 2024-09-06 DIAGNOSIS — Z800 Family history of malignant neoplasm of digestive organs: Secondary | ICD-10-CM

## 2024-09-06 DIAGNOSIS — A419 Sepsis, unspecified organism: Secondary | ICD-10-CM | POA: Diagnosis not present

## 2024-09-06 DIAGNOSIS — Z79899 Other long term (current) drug therapy: Secondary | ICD-10-CM

## 2024-09-06 DIAGNOSIS — Z888 Allergy status to other drugs, medicaments and biological substances status: Secondary | ICD-10-CM

## 2024-09-06 DIAGNOSIS — Z86718 Personal history of other venous thrombosis and embolism: Secondary | ICD-10-CM

## 2024-09-06 DIAGNOSIS — D84821 Immunodeficiency due to drugs: Secondary | ICD-10-CM | POA: Diagnosis present

## 2024-09-06 DIAGNOSIS — Z8249 Family history of ischemic heart disease and other diseases of the circulatory system: Secondary | ICD-10-CM

## 2024-09-06 DIAGNOSIS — Z7901 Long term (current) use of anticoagulants: Secondary | ICD-10-CM

## 2024-09-06 DIAGNOSIS — E66813 Obesity, class 3: Secondary | ICD-10-CM | POA: Diagnosis present

## 2024-09-06 DIAGNOSIS — D701 Agranulocytosis secondary to cancer chemotherapy: Secondary | ICD-10-CM | POA: Diagnosis present

## 2024-09-06 DIAGNOSIS — K219 Gastro-esophageal reflux disease without esophagitis: Secondary | ICD-10-CM | POA: Diagnosis present

## 2024-09-06 DIAGNOSIS — D611 Drug-induced aplastic anemia: Secondary | ICD-10-CM | POA: Diagnosis present

## 2024-09-06 DIAGNOSIS — Z7969 Long term (current) use of other immunomodulators and immunosuppressants: Secondary | ICD-10-CM

## 2024-09-06 DIAGNOSIS — E876 Hypokalemia: Secondary | ICD-10-CM | POA: Diagnosis present

## 2024-09-06 DIAGNOSIS — Z853 Personal history of malignant neoplasm of breast: Secondary | ICD-10-CM

## 2024-09-06 DIAGNOSIS — G8929 Other chronic pain: Secondary | ICD-10-CM | POA: Diagnosis present

## 2024-09-06 DIAGNOSIS — F419 Anxiety disorder, unspecified: Secondary | ICD-10-CM | POA: Diagnosis present

## 2024-09-06 DIAGNOSIS — D649 Anemia, unspecified: Secondary | ICD-10-CM

## 2024-09-06 DIAGNOSIS — K529 Noninfective gastroenteritis and colitis, unspecified: Secondary | ICD-10-CM | POA: Diagnosis present

## 2024-09-06 DIAGNOSIS — N3000 Acute cystitis without hematuria: Secondary | ICD-10-CM | POA: Diagnosis not present

## 2024-09-06 DIAGNOSIS — Z86711 Personal history of pulmonary embolism: Secondary | ICD-10-CM

## 2024-09-06 DIAGNOSIS — C50919 Malignant neoplasm of unspecified site of unspecified female breast: Secondary | ICD-10-CM | POA: Diagnosis present

## 2024-09-06 DIAGNOSIS — M329 Systemic lupus erythematosus, unspecified: Secondary | ICD-10-CM | POA: Diagnosis present

## 2024-09-06 DIAGNOSIS — F32A Depression, unspecified: Secondary | ICD-10-CM | POA: Diagnosis present

## 2024-09-06 DIAGNOSIS — Z833 Family history of diabetes mellitus: Secondary | ICD-10-CM

## 2024-09-06 DIAGNOSIS — Z7951 Long term (current) use of inhaled steroids: Secondary | ICD-10-CM

## 2024-09-06 DIAGNOSIS — M797 Fibromyalgia: Secondary | ICD-10-CM | POA: Diagnosis present

## 2024-09-06 DIAGNOSIS — D071 Carcinoma in situ of vulva: Secondary | ICD-10-CM | POA: Diagnosis not present

## 2024-09-06 DIAGNOSIS — Z91048 Other nonmedicinal substance allergy status: Secondary | ICD-10-CM

## 2024-09-06 DIAGNOSIS — J9601 Acute respiratory failure with hypoxia: Secondary | ICD-10-CM | POA: Diagnosis present

## 2024-09-06 DIAGNOSIS — F4310 Post-traumatic stress disorder, unspecified: Secondary | ICD-10-CM | POA: Diagnosis present

## 2024-09-06 DIAGNOSIS — Z87412 Personal history of vulvar dysplasia: Secondary | ICD-10-CM

## 2024-09-06 DIAGNOSIS — J123 Human metapneumovirus pneumonia: Secondary | ICD-10-CM | POA: Diagnosis present

## 2024-09-06 DIAGNOSIS — T451X5A Adverse effect of antineoplastic and immunosuppressive drugs, initial encounter: Secondary | ICD-10-CM | POA: Diagnosis present

## 2024-09-06 LAB — BASIC METABOLIC PANEL WITH GFR
Anion gap: 8 (ref 5–15)
BUN: 13 mg/dL (ref 6–20)
CO2: 26 mmol/L (ref 22–32)
Calcium: 8.1 mg/dL — ABNORMAL LOW (ref 8.9–10.3)
Chloride: 107 mmol/L (ref 98–111)
Creatinine, Ser: 0.68 mg/dL (ref 0.44–1.00)
GFR, Estimated: >60 mL/min (ref >60)
Glucose, Bld: 91 mg/dL (ref 70–99)
Potassium: 2.9 mmol/L — ABNORMAL LOW (ref 3.5–5.1)
Sodium: 141 mmol/L (ref 135–145)

## 2024-09-06 LAB — MAGNESIUM: Magnesium: 1.9 mg/dL (ref 1.7–2.4)

## 2024-09-06 LAB — CBC
HCT: 27.5 % — ABNORMAL LOW (ref 36.0–46.0)
Hemoglobin: 8.8 g/dL — ABNORMAL LOW (ref 12.0–15.0)
MCH: 32.2 pg (ref 26.0–34.0)
MCHC: 32 g/dL (ref 30.0–36.0)
MCV: 100.7 fL — ABNORMAL HIGH (ref 80.0–100.0)
Platelets: 200 K/uL (ref 150–400)
RBC: 2.73 MIL/uL — ABNORMAL LOW (ref 3.87–5.11)
RDW: 18.4 % — ABNORMAL HIGH (ref 11.5–15.5)
WBC: 3.4 K/uL — ABNORMAL LOW (ref 4.0–10.5)
nRBC: 0 % (ref 0.0–0.2)

## 2024-09-06 MED ORDER — DM-GUAIFENESIN ER 30-600 MG PO TB12
1.0000 | ORAL_TABLET | Freq: Two times a day (BID) | ORAL | 0 refills | Status: AC | PRN
  Filled 2024-09-06: qty 20, 10d supply, fill #0

## 2024-09-06 MED ORDER — POTASSIUM CHLORIDE CRYS ER 20 MEQ PO TBCR
40.0000 meq | EXTENDED_RELEASE_TABLET | ORAL | Status: AC
  Administered 2024-09-06: 40 meq via ORAL
  Filled 2024-09-06: qty 2

## 2024-09-06 MED ORDER — MIDODRINE HCL 10 MG PO TABS
10.0000 mg | ORAL_TABLET | Freq: Three times a day (TID) | ORAL | 0 refills | Status: AC
  Filled 2024-09-06: qty 90, 30d supply, fill #0

## 2024-09-06 MED ORDER — POTASSIUM CHLORIDE CRYS ER 20 MEQ PO TBCR
20.0000 meq | EXTENDED_RELEASE_TABLET | Freq: Every day | ORAL | 1 refills | Status: DC
  Filled 2024-09-06: qty 30, 30d supply, fill #0

## 2024-09-06 MED ORDER — AMOXICILLIN-POT CLAVULANATE 500-125 MG PO TABS
1.0000 | ORAL_TABLET | Freq: Two times a day (BID) | ORAL | 0 refills | Status: DC
  Filled 2024-09-06: qty 10, 5d supply, fill #0

## 2024-09-06 MED ORDER — POTASSIUM CHLORIDE CRYS ER 20 MEQ PO TBCR: 40.0000 meq | EXTENDED_RELEASE_TABLET | ORAL | Status: DC

## 2024-09-06 NOTE — TOC Progression Note (Signed)
 Transition of Care [facility]) - Progression Note    Patient Details  Name: Monica Gonzalez MRN: [id number] Date of Birth: 11-12-1976  Transition of Care [facility]) CM/SW Contact  Dalia GORMAN Fuse, RN Phone Number: 09/06/2024, 8:53 AM  Clinical Narrative:    Patient recently diagnosed with anal SCC and breast cancer triple negative. Receiving anusol for anal proctitis. Plan to discharge to home when medically appropriate. No TOC needs at this time.   Expected Discharge Plan: Home/Self Care Barriers to Discharge: Continued Medical Work up               Expected Discharge Plan and Services     Post Acute Care Choice: NA Living arrangements for the past 2 months: Mobile Home Expected Discharge Date: 09/06/24                                     Social Drivers of Health (SDOH) Interventions SDOH Screenings   Food Insecurity: No Food Insecurity (09/02/2024)  Housing: Low Risk  (09/02/2024)  Transportation Needs: No Transportation Needs (09/02/2024)  Utilities: Not At Risk (09/02/2024)  Depression (PHQ2-9): Low Risk  (08/29/2024)  Financial Resource Strain: Medium Risk (05/15/2024)   Received from [facility]  Tobacco Use: Low Risk  (08/31/2024)  Recent Concern: Tobacco Use - Medium Risk (08/05/2024)   Received from [facility]    Readmission Risk Interventions    09/03/2024    4:40 PM  Readmission Risk Prevention Plan  Transportation Screening Complete  PCP or Specialist Appt within 3-5 Days Complete  Social Work Consult for Recovery Care Planning/Counseling Complete  Palliative Care Screening Not Applicable  Medication Review Oceanographer) Complete

## 2024-09-06 NOTE — Progress Notes (Signed)
 PHARMACY CONSULT NOTE - ELECTROLYTES  Pharmacy Consult for Electrolyte Monitoring and Replacement   Recent Labs: Height: 4' 11 (149.9 cm) Weight: 91.2 kg (201 lb) IBW/kg (Calculated) : 43.2 Estimated Creatinine Clearance: 83.8 mL/min (by C-G formula based on SCr of 0.68 mg/dL). Potassium (mmol/L)  Date Value  09/06/2024 2.9 (L)  03/06/2015 3.2 (L)   Magnesium  (mg/dL)  Date Value  89/75/7974 1.9   Calcium (mg/dL)  Date Value  89/75/7974 8.1 (L)   Calcium, Total (mg/dL)  Date Value  95/77/7983 8.8 (L)   Albumin (g/dL)  Date Value  89/83/7974 2.8 (L)  03/06/2015 4.2   Phosphorus (mg/dL)  Date Value  89/79/7974 3.4   Sodium (mmol/L)  Date Value  09/06/2024 141  03/06/2015 141    Assessment  Monica Gonzalez is a 48 y.o. female presenting with UTI. 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 Pharmacy has been consulted to monitor and replace electrolytes.  Diet: regular MIVF: None Pertinent medications: None  Goal of Therapy: Electrolytes WNL  Plan:  K low at 2.9 MD ordered KCL 40 mEq PO x 1 Will order an additional dose of KCL 40 mEq PO x 1 Check BMP, Mg, Phos with AM labs  Thank you for allowing pharmacy to be a part of this patient's care.  Lum VEAR Mania, PharmD Clinical Pharmacist 09/06/2024 9:36 AM

## 2024-09-06 NOTE — ED Triage Notes (Signed)
 Pt presents to ER from home with complaints of shortness of breath and chest tightness. Pt reports she was discharged from the hospital this morning. Pt reports she is not able to carry on a conversations without feeling short of breath. Pt reports she was admitted to hospital due to sepsis. Pt also reports she thinks her PICC line is infected.

## 2024-09-07 ENCOUNTER — Emergency Department

## 2024-09-07 DIAGNOSIS — D649 Anemia, unspecified: Secondary | ICD-10-CM

## 2024-09-07 DIAGNOSIS — J9601 Acute respiratory failure with hypoxia: Secondary | ICD-10-CM

## 2024-09-07 DIAGNOSIS — A419 Sepsis, unspecified organism: Secondary | ICD-10-CM | POA: Diagnosis present

## 2024-09-07 DIAGNOSIS — T451X5A Adverse effect of antineoplastic and immunosuppressive drugs, initial encounter: Secondary | ICD-10-CM

## 2024-09-07 DIAGNOSIS — J189 Pneumonia, unspecified organism: Secondary | ICD-10-CM

## 2024-09-07 LAB — CBC WITH DIFFERENTIAL/PLATELET
Abs Immature Granulocytes: 0.03 K/uL (ref 0.00–0.07)
Basophils Absolute: 0 K/uL (ref 0.0–0.1)
Basophils Relative: 0 %
Eosinophils Absolute: 0.1 K/uL (ref 0.0–0.5)
Eosinophils Relative: 2 %
HCT: 23.3 % — ABNORMAL LOW (ref 36.0–46.0)
Hemoglobin: 7.3 g/dL — ABNORMAL LOW (ref 12.0–15.0)
Immature Granulocytes: 1 %
Lymphocytes Relative: 10 %
Lymphs Abs: 0.3 K/uL — ABNORMAL LOW (ref 0.7–4.0)
MCH: 32.3 pg (ref 26.0–34.0)
MCHC: 31.3 g/dL (ref 30.0–36.0)
MCV: 103.1 fL — ABNORMAL HIGH (ref 80.0–100.0)
Monocytes Absolute: 0.4 K/uL (ref 0.1–1.0)
Monocytes Relative: 13 %
Neutro Abs: 2.1 K/uL (ref 1.7–7.7)
Neutrophils Relative %: 74 %
Platelets: 196 K/uL (ref 150–400)
RBC: 2.26 MIL/uL — ABNORMAL LOW (ref 3.87–5.11)
RDW: 18.7 % — ABNORMAL HIGH (ref 11.5–15.5)
WBC: 2.9 K/uL — ABNORMAL LOW (ref 4.0–10.5)
nRBC: 0 % (ref 0.0–0.2)

## 2024-09-07 LAB — RESPIRATORY PANEL BY PCR

## 2024-09-07 LAB — BASIC METABOLIC PANEL WITH GFR
Anion gap: 13 (ref 5–15)
Anion gap: 11 (ref 5–15)
BUN: 10 mg/dL (ref 6–20)
BUN: 9 mg/dL (ref 6–20)
CO2: 23 mmol/L (ref 22–32)
CO2: 24 mmol/L (ref 22–32)
Calcium: 7.6 mg/dL — ABNORMAL LOW (ref 8.9–10.3)
Calcium: 7.7 mg/dL — ABNORMAL LOW (ref 8.9–10.3)
Chloride: 102 mmol/L (ref 98–111)
Chloride: 103 mmol/L (ref 98–111)
Creatinine, Ser: 0.79 mg/dL (ref 0.44–1.00)
Creatinine, Ser: 0.75 mg/dL (ref 0.44–1.00)
GFR, Estimated: >60 mL/min (ref >60)
GFR, Estimated: >60 mL/min (ref >60)
Glucose, Bld: 86 mg/dL (ref 70–99)
Glucose, Bld: 96 mg/dL (ref 70–99)
Potassium: 2.9 mmol/L — ABNORMAL LOW (ref 3.5–5.1)
Potassium: 3.3 mmol/L — ABNORMAL LOW (ref 3.5–5.1)
Sodium: 138 mmol/L (ref 135–145)
Sodium: 138 mmol/L (ref 135–145)

## 2024-09-07 LAB — CULTURE, BLOOD (ROUTINE X 2): Special Requests: ADEQUATE

## 2024-09-07 LAB — CBC
HCT: 26.3 % — ABNORMAL LOW (ref 36.0–46.0)
Hemoglobin: 8.3 g/dL — ABNORMAL LOW (ref 12.0–15.0)
MCH: 32.2 pg (ref 26.0–34.0)
MCHC: 31.6 g/dL (ref 30.0–36.0)
MCV: 101.9 fL — ABNORMAL HIGH (ref 80.0–100.0)
Platelets: 182 K/uL (ref 150–400)
RBC: 2.58 MIL/uL — ABNORMAL LOW (ref 3.87–5.11)
RDW: 19.1 % — ABNORMAL HIGH (ref 11.5–15.5)
WBC: 1.9 K/uL — ABNORMAL LOW (ref 4.0–10.5)
nRBC: 0 % (ref 0.0–0.2)

## 2024-09-07 LAB — PROCALCITONIN: Procalcitonin: 0.64 ng/mL

## 2024-09-07 LAB — LACTIC ACID, PLASMA: Lactic Acid, Venous: 0.9 mmol/L (ref 0.5–1.9)

## 2024-09-07 LAB — TYPE AND SCREEN
ABO/RH(D): O POS
Antibody Screen: NEGATIVE

## 2024-09-07 LAB — POC URINE PREG, ED: Preg Test, Ur: NEGATIVE

## 2024-09-07 LAB — MAGNESIUM: Magnesium: 1.7 mg/dL (ref 1.7–2.4)

## 2024-09-07 MED ORDER — LACTATED RINGERS IV BOLUS (SEPSIS)
1500.0000 mL | Freq: Once | INTRAVENOUS | Status: AC
  Administered 2024-09-07: 1500 mL via INTRAVENOUS

## 2024-09-07 MED ORDER — OXYCODONE HCL 5 MG PO TABS
5.0000 mg | ORAL_TABLET | Freq: Four times a day (QID) | ORAL | Status: DC | PRN
  Administered 2024-09-08: 5 mg via ORAL
  Filled 2024-09-07 (×2): qty 1

## 2024-09-07 MED ORDER — BENZONATATE 100 MG PO CAPS
100.0000 mg | ORAL_CAPSULE | Freq: Three times a day (TID) | ORAL | Status: DC | PRN
  Administered 2024-09-07: 100 mg via ORAL
  Filled 2024-09-07 (×2): qty 1

## 2024-09-07 MED ORDER — PANTOPRAZOLE SODIUM 40 MG PO TBEC
40.0000 mg | DELAYED_RELEASE_TABLET | Freq: Every day | ORAL | Status: DC
  Administered 2024-09-07 – 2024-09-14 (×8): 40 mg via ORAL
  Filled 2024-09-07 (×8): qty 1

## 2024-09-07 MED ORDER — OLANZAPINE 5 MG PO TABS
5.0000 mg | ORAL_TABLET | Freq: Every day | ORAL | Status: DC
  Administered 2024-09-07 – 2024-09-13 (×7): 5 mg via ORAL
  Filled 2024-09-07 (×7): qty 1

## 2024-09-07 MED ORDER — VANCOMYCIN HCL 1250 MG/250ML IV SOLN
1250.0000 mg | INTRAVENOUS | Status: DC
Start: 2024-09-08 — End: 2024-09-09
  Administered 2024-09-08 – 2024-09-09 (×2): 1250 mg via INTRAVENOUS
  Filled 2024-09-07 (×2): qty 250

## 2024-09-07 MED ORDER — POTASSIUM CHLORIDE CRYS ER 20 MEQ PO TBCR
40.0000 meq | EXTENDED_RELEASE_TABLET | Freq: Once | ORAL | Status: AC
  Administered 2024-09-07: 40 meq via ORAL
  Filled 2024-09-07: qty 2

## 2024-09-07 MED ORDER — SODIUM CHLORIDE 0.9% FLUSH: 10.0000 mL | INTRAVENOUS | Status: DC | PRN

## 2024-09-07 MED ORDER — LORAZEPAM 0.5 MG PO TABS
0.5000 mg | ORAL_TABLET | Freq: Two times a day (BID) | ORAL | Status: DC | PRN
  Administered 2024-09-07 – 2024-09-12 (×5): 0.5 mg via ORAL
  Filled 2024-09-07 (×6): qty 1

## 2024-09-07 MED ORDER — SODIUM CHLORIDE 0.9 % IV SOLN
1.0000 g | Freq: Three times a day (TID) | INTRAVENOUS | Status: AC
  Administered 2024-09-07 – 2024-09-11 (×14): 1 g via INTRAVENOUS
  Filled 2024-09-07 (×14): qty 20

## 2024-09-07 MED ORDER — MIDODRINE HCL 5 MG PO TABS
10.0000 mg | ORAL_TABLET | Freq: Three times a day (TID) | ORAL | Status: DC
  Administered 2024-09-07 – 2024-09-14 (×23): 10 mg via ORAL
  Filled 2024-09-07 (×23): qty 2

## 2024-09-07 MED ORDER — MONTELUKAST SODIUM 10 MG PO TABS: 10.0000 mg | ORAL_TABLET | ORAL | Status: DC

## 2024-09-07 MED ORDER — ENOXAPARIN SODIUM 100 MG/ML IJ SOSY
90.0000 mg | PREFILLED_SYRINGE | Freq: Two times a day (BID) | INTRAMUSCULAR | Status: DC
  Administered 2024-09-07 – 2024-09-14 (×15): 90 mg via SUBCUTANEOUS
  Filled 2024-09-07: qty 1
  Filled 2024-09-07: qty 0.9
  Filled 2024-09-07 (×4): qty 1
  Filled 2024-09-07: qty 0.9
  Filled 2024-09-07 (×9): qty 1
  Filled 2024-09-07: qty 0.9

## 2024-09-07 MED ORDER — ALBUTEROL SULFATE (2.5 MG/3ML) 0.083% IN NEBU: 2.5000 mg | INHALATION_SOLUTION | RESPIRATORY_TRACT | Status: DC | PRN

## 2024-09-07 MED ORDER — VANCOMYCIN HCL IN DEXTROSE 1-5 GM/200ML-% IV SOLN
1000.0000 mg | Freq: Once | INTRAVENOUS | Status: DC
  Filled 2024-09-07: qty 200

## 2024-09-07 MED ORDER — CHLORHEXIDINE GLUCONATE CLOTH 2 % EX PADS
6.0000 | MEDICATED_PAD | Freq: Every day | CUTANEOUS | Status: DC
  Administered 2024-09-07 – 2024-09-14 (×8): 6 via TOPICAL

## 2024-09-07 MED ORDER — DULOXETINE HCL 30 MG PO CPEP
30.0000 mg | ORAL_CAPSULE | Freq: Every day | ORAL | Status: DC
  Administered 2024-09-07 – 2024-09-13 (×7): 30 mg via ORAL
  Filled 2024-09-07 (×7): qty 1

## 2024-09-07 MED ORDER — BUSPIRONE HCL 10 MG PO TABS
5.0000 mg | ORAL_TABLET | Freq: Two times a day (BID) | ORAL | Status: DC
  Administered 2024-09-07 – 2024-09-14 (×15): 5 mg via ORAL
  Filled 2024-09-07 (×15): qty 1

## 2024-09-07 MED ORDER — SODIUM CHLORIDE 0.9 % IV SOLN
1.0000 g | Freq: Once | INTRAVENOUS | Status: AC
  Administered 2024-09-07: 1 g via INTRAVENOUS
  Filled 2024-09-07: qty 20

## 2024-09-07 MED ORDER — DIPHENOXYLATE-ATROPINE 2.5-0.025 MG PO TABS
1.0000 | ORAL_TABLET | Freq: Four times a day (QID) | ORAL | Status: DC | PRN
  Administered 2024-09-07: 1 via ORAL
  Filled 2024-09-07 (×2): qty 1

## 2024-09-07 MED ORDER — VANCOMYCIN HCL 2000 MG/400ML IV SOLN
2000.0000 mg | Freq: Once | INTRAVENOUS | Status: AC
  Administered 2024-09-07: 2000 mg via INTRAVENOUS
  Filled 2024-09-07: qty 400

## 2024-09-07 MED ORDER — IPRATROPIUM-ALBUTEROL 0.5-2.5 (3) MG/3ML IN SOLN
3.0000 mL | Freq: Four times a day (QID) | RESPIRATORY_TRACT | Status: DC
Start: 2024-09-07 — End: 2024-09-10
  Administered 2024-09-07 – 2024-09-10 (×11): 3 mL via RESPIRATORY_TRACT
  Filled 2024-09-07 (×11): qty 3

## 2024-09-07 MED ORDER — FENTANYL 25 MCG/HR TD PT72
1.0000 | MEDICATED_PATCH | TRANSDERMAL | Status: DC
  Administered 2024-09-07 – 2024-09-12 (×3): 1 via TRANSDERMAL
  Filled 2024-09-07 (×3): qty 1

## 2024-09-07 MED ORDER — ACETAMINOPHEN 325 MG PO TABS
650.0000 mg | ORAL_TABLET | ORAL | Status: DC | PRN
  Administered 2024-09-07 – 2024-09-12 (×8): 650 mg via ORAL
  Filled 2024-09-07 (×8): qty 2

## 2024-09-07 MED ORDER — BUDESONIDE 0.25 MG/2ML IN SUSP
0.2500 mg | Freq: Two times a day (BID) | RESPIRATORY_TRACT | Status: DC
  Administered 2024-09-07 – 2024-09-14 (×14): 0.25 mg via RESPIRATORY_TRACT
  Filled 2024-09-07 (×14): qty 2

## 2024-09-07 MED ORDER — POTASSIUM CHLORIDE CRYS ER 20 MEQ PO TBCR: 40.0000 meq | EXTENDED_RELEASE_TABLET | Freq: Once | ORAL | Status: DC

## 2024-09-07 MED ORDER — DM-GUAIFENESIN ER 30-600 MG PO TB12
1.0000 | ORAL_TABLET | Freq: Two times a day (BID) | ORAL | Status: DC | PRN
  Administered 2024-09-07: 1 via ORAL
  Filled 2024-09-07: qty 1

## 2024-09-07 MED ORDER — GUAIFENESIN-DM 100-10 MG/5ML PO SYRP
10.0000 mL | ORAL_SOLUTION | ORAL | Status: DC | PRN
  Administered 2024-09-07 – 2024-09-08 (×3): 10 mL via ORAL
  Filled 2024-09-07 (×3): qty 10

## 2024-09-07 MED ORDER — IPRATROPIUM-ALBUTEROL 0.5-2.5 (3) MG/3ML IN SOLN
RESPIRATORY_TRACT | Status: AC
  Filled 2024-09-07: qty 3

## 2024-09-07 MED ORDER — BENZONATATE 100 MG PO CAPS
200.0000 mg | ORAL_CAPSULE | Freq: Three times a day (TID) | ORAL | Status: DC
  Administered 2024-09-07 – 2024-09-14 (×20): 200 mg via ORAL
  Filled 2024-09-07 (×20): qty 2

## 2024-09-07 MED ORDER — GABAPENTIN 300 MG PO CAPS
600.0000 mg | ORAL_CAPSULE | Freq: Two times a day (BID) | ORAL | Status: DC
  Administered 2024-09-07 – 2024-09-14 (×15): 600 mg via ORAL
  Filled 2024-09-07 (×15): qty 2

## 2024-09-07 NOTE — Progress Notes (Signed)
 CODE SEPSIS - PHARMACY COMMUNICATION  **Broad Spectrum Antibiotics should be administered within 1 hour of Sepsis diagnosis**  Time Code Sepsis Called/[place] Received:  10/25 @ 0318  Antibiotics Ordered: Vanc, meropenem   Time of 1st antibiotic administration: Meropenem 1 gm IV X 1 on 10/25 @ 0417   Additional action taken by pharmacy:   If necessary, Name of Provider/Nurse Contacted:     Ahmyah Gidley D ,PharmD Clinical Pharmacist  09/07/2024  4:25 AM

## 2024-09-07 NOTE — Assessment & Plan Note (Signed)
 On no relevant medication

## 2024-09-07 NOTE — Assessment & Plan Note (Signed)
 Chronic anticoagulation with enoxaparin  No acute disease suspected at this time Continue treatment dose Lovenox 

## 2024-09-07 NOTE — Assessment & Plan Note (Addendum)
 Suspect chronic GI blood loss from anal cancer and chemotherapy On chronic anticoagulation for prior DVT/PE Hemoglobin 7.3, downtrending from 9.6 on 09/04/2024 Close monitoring in view of chronic anticoagulation Serial H&H and transfuse if needed-consented Type and screen

## 2024-09-07 NOTE — Assessment & Plan Note (Signed)
 S/p right lumpectomy.

## 2024-09-07 NOTE — ED Notes (Addendum)
 Pt's O2 saturation dropped to 86% - visualized by this nurse and Harlene FALCON RN. MD made aware. 2L O2 nasal cannula placed on pt and O2 saturation increased to WNL of 96%

## 2024-09-07 NOTE — ED Notes (Signed)
 Pt O2 at 88% while asleep, [name] increased to 4L with O2 rising to 92%.

## 2024-09-07 NOTE — Progress Notes (Signed)
 Pharmacy Antibiotic Note  Monica Gonzalez is a 48 y.o. female admitted on 09/06/2024 with sepsis.  Pharmacy has been consulted for Vanc, meropenem dosing.  Plan: Meropenem 1 gm IV X 1 given in ED on 10/25 @ 0417. Meropenem 1 gm IV Q8H ordered to start on 10/25 @ 1200.  Vancomycin 2 gm IV X 1 given in ED on 10/25 @ 0510. Vancomycin 1250 mg IV Q24H ordered to start on 10/26 @ 0500.  AUC = 521.6 Vanc trough = 11.7   Height: 4' 11 (149.9 cm) Weight: 91.2 kg (201 lb 1 oz) IBW/kg (Calculated) : 43.2  Temp (24hrs), Avg:98.7 F (37.1 C), Min:98 F (36.7 C), Max:99.4 F (37.4 C)  Recent Labs  Lab 09/03/24 0615 09/03/24 1805 09/03/24 2349 09/04/24 0500 09/04/24 0530 09/04/24 1603 09/04/24 1935 09/05/24 0545 09/06/24 0552 09/07/24 0023  WBC 3.4*  --   --  2.8* 2.9*  --   --  2.1* 3.4* 2.9*  CREATININE 0.71  --   --   --  0.80  --   --  0.77 0.68 0.79  LATICACIDVEN  --  2.0* 1.9  --   --  0.8 1.0  --   --  0.9    Estimated Creatinine Clearance: 83.8 mL/min (by C-G formula based on SCr of 0.79 mg/dL).    Allergies  Allergen Reactions   Prednisone Shortness Of Breath and Swelling    Patient has taken methylprednisolone  (Depo-Medrol ) and dexamethasone  (Decadron ) without any documented side effects or adverse reactions. She describes that she used to take prednisone due to her bronchial asthma and respiratory problems, but later on she received some prednisone which into locations caused her to have significant swelling and shortness of breath. For that reason, she was told to avoid the use of prednisone.   Silicone Hives and Itching    Antimicrobials this admission:   >>    >>   Dose adjustments this admission:   Microbiology results:  BCx:   UCx:    Sputum:    MRSA PCR:   Thank you for allowing pharmacy to be a part of this patient's care.  Felix Meras D 09/07/2024 5:12 AM

## 2024-09-07 NOTE — H&P (Signed)
 History and Physical    Patient: Monica Gonzalez FMW:[id number] DOB: August 07, 1976 DOA: 09/06/2024 DOS: the patient was seen and examined on 09/07/2024 PCP: Epifanio Alm SQUIBB, MD  Patient coming from: Home  Chief Complaint:  Chief Complaint  Patient presents with   Shortness of Breath    HPI: Monica Gonzalez is a 48 y.o. 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 She presented with worsening shortness of breath since arrival home and reports worsening cough as well.  Denies fever or chills. In the ED, temp of 99.4, tachycardic to 113 with O2 sat dropping to the 86 % on room air improving to 95% on 2 L. Labs notable for baseline leukopenia of 2.9 with lactic acid 0.9 and procalcitonin 0.64. Hemoglobin 7.3 down from 8.8 today prior Platelets WNL. Potassium 2.9 EKG, with sinus tachycardia at 102 CT chest without contrast consistent with multifocal pneumonia  Patient started on meropenem and vancomycin and started on sepsis fluids Admission requested.     Review of Systems: As mentioned in the history of present illness. All other systems reviewed and are negative.  Past Medical History:  Diagnosis Date   Anemia    Anginal pain    Anxiety    Arthritis    Asthma    Cancer (HCC)    rectal cancer, breast cancer 2025   Chronic pain syndrome 05/21/2023   DDD (degenerative disc disease), cervical 05/22/2023   Deep venous thrombosis (HCC)    Depression     Dyspnea    Fibromyalgia    GERD (gastroesophageal reflux disease)    Headache    Heart murmur    History of hiatal hernia    History of kidney stones    Lupus    Malignant neoplasm of upper-outer quadrant of right female breast, unspecified estrogen receptor status (HCC) 2025   Pneumonia    PTSD (post-traumatic stress disorder)    Skin candidiasis 05/29/2024   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   Past Surgical History:  Procedure Laterality Date   BREAST BIOPSY Right 06/18/2024   US  RT BREAST BX W LOC DEV 1ST LESION IMG BX SPEC US  GUIDE 06/18/2024 [facility]   BREAST BIOPSY Right 06/18/2024   US  RT BREAST BX W LOC DEV EA ADD LESION IMG BX SPEC US  GUIDE 06/18/2024 [facility]   BREAST BIOPSY Right 06/18/2024   US  RT BREAST BX W LOC DEV EA ADD LESION IMG BX SPEC US  GUIDE 06/18/2024 [facility]   BREAST BIOPSY Right 07/05/2024   US  RT BREAST SAVI/RF TAG 1ST LESION US  GUIDE 07/05/2024 [facility]   BREAST BIOPSY Right 07/05/2024   US  RT BREAST SAVI/RF TAG EA ADD'L LESION US  GUIDE 07/05/2024 [facility]   BREAST LUMPECTOMY Right 07/25/2024   Procedure: BREAST LUMPECTOMY;  Surgeon: Tye Millet, DO;  Location: [facility];  Service: General;  Laterality: Right;   BREAST LUMPECTOMY WITH RADIO FREQUENCY LOCALIZER Right 07/08/2024   Procedure: BREAST LUMPECTOMY WITH RADIO FREQUENCY LOCALIZER;  Surgeon: Tye Millet, DO;  Location: [facility];  Service: General;  Laterality: Right;   COLONOSCOPY WITH PROPOFOL  N/A 02/17/2023  Procedure: COLONOSCOPY WITH PROPOFOL ;  Surgeon: Maryruth Ole DASEN, MD;  Location: [facility];  Service: Endoscopy;  Laterality: N/A;   ESOPHAGOGASTRODUODENOSCOPY (EGD) WITH PROPOFOL  N/A 02/17/2023   Procedure: ESOPHAGOGASTRODUODENOSCOPY (EGD) WITH PROPOFOL ;  Surgeon: Maryruth Ole DASEN, MD;  Location: [facility];  Service: Endoscopy;  Laterality: N/A;   EXTRACORPOREAL SHOCK WAVE LITHOTRIPSY     IR CV LINE INJECTION  08/21/2024    IR REMOVAL TUN ACCESS W/ PORT W/O FL MOD SED  08/28/2024   PORTACATH PLACEMENT Right 07/08/2024   Procedure: INSERTION, TUNNELED CENTRAL VENOUS DEVICE, WITH PORT;  Surgeon: Tye Millet, DO;  Location: [facility];  Service: General;  Laterality: Right;   RECTAL EXAM UNDER ANESTHESIA N/A 05/16/2024   Procedure: EXAM UNDER ANESTHESIA, RECTUM, INCISIONAL BIOPSY OF ANAL MASS;  Surgeon: Tye Millet, DO;  Location: [facility];  Service: General;  Laterality: N/A;   SENTINEL NODE BIOPSY Right 07/08/2024   Procedure: BIOPSY, LYMPH NODE, SENTINEL;  Surgeon: Tye Millet, DO;  Location: [facility];  Service: General;  Laterality: Right;   TONSILLECTOMY     TUBAL LIGATION     VULVECTOMY N/A 07/29/2020   Procedure: WIDE EXCISION VULVECTOMY;  Surgeon: Mancil Barter, MD;  Location: [facility];  Service: Gynecology;  Laterality: N/A;   Social History:  reports that she has never smoked. She has never used smokeless tobacco. She reports that she does not currently use alcohol. She reports that she does not use drugs.  Allergies  Allergen Reactions   Prednisone Shortness Of Breath and Swelling    Patient has taken methylprednisolone  (Depo-Medrol ) and dexamethasone  (Decadron ) without any documented side effects or adverse reactions. She describes that she used to take prednisone due to her bronchial asthma and respiratory problems, but later on she received some prednisone which into locations caused her to have significant swelling and shortness of breath. For that reason, she was told to avoid the use of prednisone.   Silicone Hives and Itching    Family History  Problem Relation Age of Onset   Diabetes Mother    Congestive Heart Failure Mother    Colon cancer Father 48   Lupus Maternal Grandmother    Cancer Maternal Grandfather        either prostate or colon   Colon cancer Paternal Grandfather    Lung cancer Paternal Grandfather     Prior to Admission medications   Medication Sig Start Date End Date  Taking? Authorizing Provider  albuterol  (VENTOLIN  HFA) 108 (90 Base) MCG/ACT inhaler Inhale 2 puffs into the lungs every 4 (four) hours as needed for wheezing or shortness of breath. 07/01/24   Clarine Ozell LABOR, MD  amoxicillin -clavulanate (AUGMENTIN ) 500-125 MG tablet Take 1 tablet by mouth 2 (two) times daily for 5 days. 09/06/24 09/11/24  Maree Hue, MD  ascorbic acid (VITAMIN C) 500 MG tablet Take 500 mg by mouth daily.    [provider]  benzonatate  (TESSALON ) 200 MG capsule Take 1 capsule (200 mg total) by mouth 3 (three) times daily as needed for cough. 07/04/24   Tamea Dedra CROME, MD  BIOTIN PO Take by mouth daily.    [provider]  budesonide  (PULMICORT ) 0.25 MG/2ML nebulizer solution Take 2 mLs (0.25 mg total) by nebulization 2 (two) times daily. 08/08/24   Tamea Dedra CROME, MD  busPIRone  (BUSPAR ) 5 MG tablet Take 5 mg by mouth 2 (two) times daily. 01/31/24 01/30/25  [provider]  capecitabine (XELODA) 500 MG tablet Take 3 tablets (1,500 mg total) by mouth 2 (  two) times daily after a meal. Take within 30 minutes after meals. Take only on days when you have radiation. Start on 08/29/24. 08/22/24   Babara Call, MD  cholecalciferol (VITAMIN D3) 25 MCG (1000 UNIT) tablet Take 1,000 Units by mouth daily.    [provider]  dextromethorphan -guaiFENesin  (MUCINEX  DM) 30-600 MG 12hr tablet Take 1 tablet by mouth 2 (two) times daily as needed for up to 10 days for cough. 09/06/24 09/16/24  Maree Hue, MD  DULoxetine  (CYMBALTA ) 30 MG capsule Take 30 mg by mouth at bedtime. 05/10/24 05/10/25  [provider]  enoxaparin  (LOVENOX ) 80 MG/0.8ML injection Inject 0.8 mLs (80 mg total) into the skin every 12 (twelve) hours. 08/15/24   Babara Call, MD  esomeprazole  (NEXIUM ) 40 MG capsule Take 1 capsule (40 mg total) by mouth 2 (two) times daily before a meal. 08/08/24   Tamea Dedra CROME, MD  fentaNYL  (DURAGESIC ) 25 MCG/HR Place 1 patch onto the skin every 3 (three)  days. 08/25/24   Babara Call, MD  gabapentin  (NEURONTIN ) 300 MG capsule Take 600 mg by mouth 2 (two) times daily. 04/14/23 05/16/27  [provider]  hydrocortisone (ANUSOL-HC) 2.5 % rectal cream Place 1 Application rectally 2 (two) times daily. 08/22/24   Dasie Tinnie MATSU, NP  ipratropium (ATROVENT  HFA) 17 MCG/ACT inhaler Inhale 2 puffs into the lungs every 6 (six) hours as needed for wheezing (COUGH). 07/04/24 07/04/25  Tamea Dedra CROME, MD  ipratropium-albuterol  (DUONEB) 0.5-2.5 (3) MG/3ML SOLN Take 3 mLs by nebulization 4 (four) times daily. 08/08/24   Tamea Dedra CROME, MD  lidocaine  (LIDODERM ) 5 % 1 patch daily. 06/13/24   [provider]  linaclotide (LINZESS) 145 MCG CAPS capsule Take 145 mcg by mouth daily before breakfast.    [provider]  LORazepam (ATIVAN) 0.5 MG tablet Take 1 tablet (0.5 mg total) by mouth every 12 (twelve) hours as needed for anxiety (nausea). 08/15/24   Babara Call, MD  midodrine (PROAMATINE) 10 MG tablet Take 1 tablet (10 mg total) by mouth 3 (three) times daily with meals. 09/06/24 10/06/24  Maree Hue, MD  montelukast  (SINGULAIR ) 10 MG tablet Take 1 tablet (10 mg total) by mouth See admin instructions. Take 1 tablet on the day prior to chemotherapy and take 1 tablet daily for 2 days after chemotherapy. 07/31/24   Babara Call, MD  Multiple Vitamin (MULTIVITAMIN WITH MINERALS) TABS tablet Take 1 tablet by mouth daily.    [provider]  nystatin  (MYCOSTATIN ) 100000 UNIT/ML suspension Take 5 mLs (500,000 Units total) by mouth 4 (four) times daily. Swish and swallow 08/06/24   Babara Call, MD  OLANZapine (ZYPREXA) 5 MG tablet Take 1 tablet (5 mg total) by mouth at bedtime. 08/22/24   Dasie Tinnie MATSU, NP  oxyCODONE  (OXY IR/ROXICODONE ) 5 MG immediate release tablet Take 1 tablet (5 mg total) by mouth every 6 (six) hours as needed for severe pain (pain score 7-10) or breakthrough pain. 08/25/24   Babara Call, MD  potassium chloride  SA (KLOR-CON  M) 20 MEQ  tablet Take 1 tablet (20 mEq total) by mouth daily. 09/06/24 10/06/24  Maree Hue, MD  Turmeric (QC TUMERIC COMPLEX PO) Take by mouth daily.    [provider]    Physical Exam: Vitals:   09/06/24 2331 09/07/24 0115 09/07/24 0200 09/07/24 0230  BP:  101/61 117/73 (!) 111/51  Pulse:  99 (!) 110 (!) 113  Resp:    16  Temp:      TempSrc:  SpO2:  91% 94% 95%  Weight: 91.2 kg     Height: 4' 11 (1.499 m)      Physical Exam Vitals and nursing note reviewed.  Constitutional:      General: She is not in acute distress.    Comments: Patient with congested cough, tachypneic, conversational dyspnea but not in acute distress  HENT:     Head: Normocephalic and atraumatic.  Cardiovascular:     Rate and Rhythm: Regular rhythm. Tachycardia present.     Heart sounds: Normal heart sounds.  Pulmonary:     Effort: Pulmonary effort is normal.     Breath sounds: Wheezing present.     Comments: Coarse breath sounds Abdominal:     Palpations: Abdomen is soft.     Tenderness: There is no abdominal tenderness.  Neurological:     Mental Status: Mental status is at baseline.     Labs on Admission: I have personally reviewed following labs and imaging studies  CBC: Recent Labs  Lab 09/04/24 0500 09/04/24 0530 09/05/24 0545 09/06/24 0552 09/07/24 0023  WBC 2.8* 2.9* 2.1* 3.4* 2.9*  NEUTROABS 2.3  --  1.7  --  2.1  HGB 9.6* 9.6* 9.0* 8.8* 7.3*  HCT 29.6* 30.2* 28.5* 27.5* 23.3*  MCV 99.3 99.7 101.4* 100.7* 103.1*  PLT 219 218 188 200 196   Basic Metabolic Panel: Recent Labs  Lab 09/02/24 0422 09/03/24 0615 09/04/24 0530 09/05/24 0545 09/06/24 0552 09/07/24 0023  NA 143 140 142 142 141 138  K 4.0 3.7 3.8 3.6 2.9* 2.9*  CL 107 104 101 105 107 102  CO2 27 26 27 28 26 23   GLUCOSE 120* 88 103* 146* 91 86  BUN 9 7 7 10 13 10   CREATININE 0.70 0.71 0.80 0.77 0.68 0.79  CALCIUM 8.3* 8.1* 8.5* 8.3* 8.1* 7.6*  MG 1.9  --   --   --  1.9  --   PHOS 3.4  --   --   --   --    --    GFR: Estimated Creatinine Clearance: 83.8 mL/min (by C-G formula based on SCr of 0.79 mg/dL). Liver Function Tests: No results for input(s): AST, ALT, ALKPHOS, BILITOT, PROT, ALBUMIN in the last 168 hours. No results for input(s): LIPASE, AMYLASE in the last 168 hours. No results for input(s): AMMONIA in the last 168 hours. Coagulation Profile: No results for input(s): INR, PROTIME in the last 168 hours. Cardiac Enzymes: No results for input(s): CKTOTAL, CKMB, CKMBINDEX, TROPONINI in the last 168 hours. BNP (last 3 results) No results for input(s): PROBNP in the last 8760 hours. HbA1C: No results for input(s): HGBA1C in the last 72 hours. CBG: No results for input(s): GLUCAP in the last 168 hours. Lipid Profile: No results for input(s): CHOL, HDL, LDLCALC, TRIG, CHOLHDL, LDLDIRECT in the last 72 hours. Thyroid  Function Tests: No results for input(s): TSH, T4TOTAL, FREET4, T3FREE, THYROIDAB in the last 72 hours. Anemia Panel: Recent Labs    09/05/24 0545  FERRITIN 214  TIBC 213*  IRON 35   Urine analysis:    Component Value Date/Time   COLORURINE AMBER (A) 08/31/2024 1416   APPEARANCEUR TURBID (A) 08/31/2024 1416   APPEARANCEUR Clear 03/06/2015 1450   LABSPEC 1.027 08/31/2024 1416   LABSPEC 1.012 03/06/2015 1450   PHURINE 5.0 08/31/2024 1416   GLUCOSEU NEGATIVE 08/31/2024 1416   GLUCOSEU Negative 03/06/2015 1450   HGBUR NEGATIVE 08/31/2024 1416   BILIRUBINUR NEGATIVE 08/31/2024 1416   BILIRUBINUR Negative 03/06/2015 1450  KETONESUR NEGATIVE 08/31/2024 1416   PROTEINUR 100 (A) 08/31/2024 1416   NITRITE NEGATIVE 08/31/2024 1416   LEUKOCYTESUR MODERATE (A) 08/31/2024 1416   LEUKOCYTESUR Negative 03/06/2015 1450    Radiological Exams on Admission: CT Chest Wo Contrast Result Date: 09/07/2024 EXAM: CT CHEST WITHOUT CONTRAST 09/07/2024 03:02:30 AM TECHNIQUE: CT of the chest was performed without the  administration of intravenous contrast. Multiplanar reformatted images are provided for review. Automated exposure control, iterative reconstruction, and/or weight based adjustment of the mA/kV was utilized to reduce the radiation dose to as low as reasonably achievable. COMPARISON: Chest radiograph earlier today. CLINICAL HISTORY: Respiratory illness, nondiagnostic xray. Pt complaints of shortness of breath and chest tightness. Pt reports she was discharged from the hospital this morning. Pt reports she is not able to carry on a conversations without feeling short of breath. Pt reports she was admitted to hospital due to sepsis. FINDINGS: MEDIASTINUM: Trace pericardial effusion. Left arm PICC terminates in the mid SVC. The central airways are clear. LYMPH NODES: Subcentimeter mediastinal lymph nodes, likely reactive. No hilar or axillary lymphadenopathy. LUNGS AND PLEURA: Multifocal patchy right lung opacities, right lower lobe predominant, favoring multifocal pneumonia. Mild left basilar opacity, atelectasis versus pneumonia. Small bilateral pleural effusions. No pneumothorax. SOFT TISSUES/BONES: No acute abnormality of the bones or soft tissues. UPPER ABDOMEN: Limited images of the upper abdomen demonstrate mild hepatic steatosis. IMPRESSION: 1. Multifocal patchy right lung opacities, right lower lobe predominant, favoring multifocal pneumonia. 2. Mild left basilar opacity, atelectasis versus pneumonia. 3. Small bilateral pleural effusions. Electronically signed by: Pinkie Pebbles MD 09/07/2024 03:05 AM EDT RP Workstation: [id number]   DG Chest 1 View Result Date: 09/07/2024 EXAM: 1 VIEW(S) XRAY OF THE CHEST 09/07/2024 12:01:00 AM COMPARISON: 09/03/2024 CLINICAL HISTORY: [id number] Shortness of breath 10026. Pt presents to ER from home with complaints of shortness of breath and chest tightness. Pt reports she was discharged from the hospital this morning. Pt reports she is not able to carry on a conversations  without feeling short of breath. Pt reports she ; was admitted to hospital due to sepsis. Pt also reports she thinks her PICC line is infected. Pt presents to ER from home with complaints of shortness of breath and chest tightness. Pt reports she was discharged from the hospital this morning. Pt reports she is not able to carry on a conversations without feeling short of breath. Pt reports she ; was admitted to hospital due to sepsis. Pt also reports she thinks her PICC line is infected. FINDINGS: LINES, TUBES AND DEVICES: Stable left arm PICC at the cavoatrial junction. LUNGS AND PLEURA: New patchy right lower lobe opacity, favoring pneumonia over atelectasis. No pulmonary edema. No pleural effusion. No pneumothorax. HEART AND MEDIASTINUM: No acute abnormality of the cardiac and mediastinal silhouettes. BONES AND SOFT TISSUES: No acute osseous abnormality. IMPRESSION: 1. New patchy right lower lobe opacity, favoring pneumonia over atelectasis. Electronically signed by: Pinkie Pebbles MD 09/07/2024 12:05 AM EDT RP Workstation: [id number]   Data Reviewed for HPI: Relevant notes from primary care and specialist visits, past discharge summaries as available in EHR, including Care Everywhere. Prior diagnostic testing as pertinent to current admission diagnoses Updated medications and problem lists for reconciliation ED course, including vitals, labs, imaging, treatment and response to treatment Triage notes, nursing and pharmacy notes and ED provider's notes Notable results as noted above in HPI      Assessment and Plan: * Sepsis due to multifocal pneumonia (HCC) Acute respiratory failure with hypoxia At risk for severe  infection due  immunosuppression Sepsis criteria include low-grade temp, tachycardia, hypoxia Patient presents with shortness of breath, O2 sats 86% in the ED requiring 2 L Respiratory viral panel Sepsis fluids Meropenem and vancomycin Antitussives, albuterol  as needed And  incentive spirometer  Acute on chronic anemia Suspect chronic GI blood loss from anal cancer and chemotherapy On chronic anticoagulation for prior DVT/PE Hemoglobin 7.3, downtrending from 9.6 on 09/04/2024 Close monitoring in view of chronic anticoagulation Serial H&H and transfuse if needed-consented Type and screen  History of PE, 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) Chronic anticoagulation with enoxaparin  No acute disease suspected at this time Continue treatment dose Lovenox   Leukopenia due to antineoplastic chemotherapy At baseline  Invasive carcinoma of breast (HCC) S/p right lumpectomy  SLE (systemic lupus erythematosus related syndrome) (HCC) On no relevant medication  Hypokalemia Replete and monitor Pharmacy consult for electrolyte management  Chronic pain Anxiety and depression Continue fentanyl  with oxycodone  as needed and gabapentin  and duloxetine  Continue buspirone  and Ativan    Anal squamous cell carcinoma (HCC) On chemoradiation  Obesity, Class III, BMI 40-49.9 (morbid obesity) (HCC) BMI 40.61 Complicating factor to overall prognosis and care        DVT prophylaxis: Lovenox , treatment dose  Consults: none  Advance Care Planning:   Code Status: Prior   Family Communication: none  Disposition Plan: Back to previous home environment  Severity of Illness: The appropriate patient status for this patient is OBSERVATION. Observation status is judged to be reasonable and necessary in order to provide the required intensity of service to ensure the patient's safety. The patient's presenting symptoms, physical exam findings, and initial radiographic and laboratory data in the context of their medical condition is felt to place them at decreased risk for further clinical deterioration. Furthermore, it is anticipated that the patient will be medically stable for discharge from the hospital within 2 midnights of admission.    Author: Delayne LULLA Solian, MD 09/07/2024 4:22 AM  For on call review [email or web address].

## 2024-09-07 NOTE — Assessment & Plan Note (Signed)
 On chemoradiation

## 2024-09-07 NOTE — Assessment & Plan Note (Addendum)
 Acute respiratory failure with hypoxia At risk for severe infection due  immunosuppression Sepsis criteria include low-grade temp, tachycardia, hypoxia Patient presents with shortness of breath, O2 sats 86% in the ED requiring 2 L Respiratory viral panel Sepsis fluids Meropenem and vancomycin Antitussives, albuterol  as needed And incentive spirometer

## 2024-09-07 NOTE — Assessment & Plan Note (Addendum)
 Anxiety and depression Continue fentanyl  with oxycodone  as needed and gabapentin  and duloxetine  Continue buspirone  and Ativan

## 2024-09-07 NOTE — Assessment & Plan Note (Signed)
 BMI 40.61 Complicating factor to overall prognosis and care

## 2024-09-07 NOTE — Progress Notes (Addendum)
 Progress Note    Monica Gonzalez  FMW:[id number] DOB: 09-27-1976  DOA: 09/06/2024 PCP: Epifanio Alm SQUIBB, MD      Brief Narrative:    Medical records reviewed and are as summarized below:  Monica Gonzalez is a 48 y.o. 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  She was initially treated with broad-spectrum antibiotics including IV cefepime, vancomycin, Flagyl meropenem.  She was discharged on Augmentin  on 09/06/2024. Unfortunately, she returned to the emergency department later in the evening on 09/06/2024 with complaints of frequent cough, shortness of breath and chest tightness.  Vital signs in the ED: Temperature 99.4 F, respiratory rate 14, pulse 103, BP 108/58, O2 sat 94% on room air.  O2 saturation subsequently dropped to 88% while she was sleeping.  Labs significant for WBC 2.9, hemoglobin 7.3, hematocrit 23.3, platelet 196, potassium 2.9, lactic acid normal (0.9) and procalcitonin 0.64.   Chest x-ray IMPRESSION: 1. New patchy right lower lobe opacity, favoring pneumonia over atelectasis.  CT chest without contrast IMPRESSION: 1. Multifocal patchy right lung opacities, right lower lobe predominant, favoring multifocal pneumonia. 2. Mild left basilar opacity, atelectasis versus pneumonia. 3. Small bilateral pleural effusions.    She was readmitted to the hospital for sepsis secondary to multifocal pneumonia.    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  (Class III obesity)   Sepsis secondary to multifocal pneumonia: Continue empiric IV vancomycin and meropenem.  Follow-up blood cultures. Respiratory viral panel positive for metapneumovirus.  This could be the cause of her pneumonia.  Possibly secondary bacterial involvement as well   Acute hypoxic respiratory failure: Continue 2 L/min oxygen via nasal cannula.  Wean off oxygen as able.   Hypokalemia: Replete potassium and monitor levels   Acute on chronic anemia: Probably related to malignancy and chemoradiation.  H&H stable.  Hemoglobin up from 7.3-8.3.  No indication for blood transfusion at this time.   Leukopenia: Probably due to sepsis.   Anal cancer on chemotherapy and radiation therapy, history of right breast cancer s/p lumpectomy: Outpatient follow-up with oncologist.   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   History of PE, DVT right internal jugular and right subclavian vein in September 2025): Continue therapeutic dose Lovenox .   Comorbidities include chronic pain, anxiety, depression, SLE   Diet Order             Diet regular Room service appropriate? Yes; Fluid consistency: Thin  Diet effective now  Consultants: None  Procedures: None    Medications:    budesonide   0.25 mg Nebulization BID   busPIRone   5 mg Oral BID   DULoxetine   30 mg Oral QHS   enoxaparin   90 mg Subcutaneous Q12H   fentaNYL   1 patch Transdermal Q72H   gabapentin   600 mg  Oral BID   ipratropium-albuterol   3 mL Nebulization QID   ipratropium-albuterol        midodrine  10 mg Oral TID WC   OLANZapine  5 mg Oral QHS   pantoprazole  40 mg Oral Daily   Continuous Infusions:  meropenem (MERREM) IV 1 g (09/07/24 1318)   [START ON 09/08/2024] vancomycin       Anti-infectives (From admission, onward)    Start     Dose/Rate Route Frequency Ordered Stop   09/08/24 0500  vancomycin (VANCOREADY) IVPB 1250 mg/250 mL        1,250 mg 166.7 mL/hr over 90 Minutes Intravenous Every 24 hours 09/07/24 0459     09/07/24 1200  meropenem (MERREM) 1 g in sodium chloride  0.9 % 100 mL IVPB        1 g 200 mL/hr over 30 Minutes Intravenous Every 8 hours 09/07/24 0441     09/07/24 0445  vancomycin (VANCOREADY) IVPB 2000 mg/400 mL        2,000 mg 200 mL/hr over 120 Minutes Intravenous  Once 09/07/24 0441 09/07/24 0714   09/07/24 0330  vancomycin (VANCOCIN) IVPB 1000 mg/200 mL premix  Status:  Discontinued        1,000 mg 200 mL/hr over 60 Minutes Intravenous  Once 09/07/24 0318 09/07/24 0440   09/07/24 0330  meropenem (MERREM) 1 g in sodium chloride  0.9 % 100 mL IVPB        1 g 200 mL/hr over 30 Minutes Intravenous  Once 09/07/24 0318 09/07/24 0455              Family Communication/Anticipated D/C date and plan/Code Status   DVT prophylaxis:      Code Status: Prior  Family Communication: None Disposition Plan: Plan to discharge home   Status is: Observation The patient will require care spanning > 2 midnights and should be moved to inpatient because: Sepsis from multifocal pneumonia       Subjective:   Interval events noted.  She complains of frequent dry cough, chest tightness and shortness of breath.  She said she had diarrhea yesterday and she attributes this to anal cancer.  When I saw her this morning, she had not had any diarrhea at the time of my visit.  She requested something stronger for cough.  She had fever later in the  afternoon.  Objective:    Vitals:   09/07/24 1233 09/07/24 1447 09/07/24 1538 09/07/24 1541  BP: 125/87  (!) 113/50   Pulse: (!) 112  (!) 114   Resp: 18  17   Temp:   (!) 97.5 F (36.4 C) (!) 101.5 F (38.6 C)  TempSrc:      SpO2: (!) 89% 91% 93%   Weight:      Height:       No data found.   Intake/Output Summary (Last 24 hours) at 09/07/2024 1549 Last data filed at 09/07/2024 9362 Gross per 24 hour  Intake 1600 ml  Output --  Net 1600 ml   Filed Weights   09/06/24 2331  Weight: 91.2 kg    Exam:  GEN: NAD SKIN: Warm and dry EYES: No pallor or icterus  ENT: MMM CV: RRR, tachycardic PULM: Bilateral rhonchi, bibasilar rales ABD: soft, obese, NT, +BS CNS: AAO x 3, non focal EXT: No edema or tenderness        Data Reviewed:   I have personally reviewed following labs and imaging studies:  Labs: Labs show the following:   Basic Metabolic Panel: Recent Labs  Lab 09/02/24 0422 09/03/24 0615 09/04/24 0530 09/05/24 0545 09/06/24 0552 09/07/24 0023 09/07/24 0849 09/07/24 0851  NA 143   < > 142 142 141 138  --  138  K 4.0   < > 3.8 3.6 2.9* 2.9*  --  3.3*  CL 107   < > 101 105 107 102  --  103  CO2 27   < > 27 28 26 23   --  24  GLUCOSE 120*   < > 103* 146* 91 86  --  96  BUN 9   < > 7 10 13 10   --  9  CREATININE 0.70   < > 0.80 0.77 0.68 0.79  --  0.75  CALCIUM 8.3*   < > 8.5* 8.3* 8.1* 7.6*  --  7.7*  MG 1.9  --   --   --  1.9  --  1.7  --   PHOS 3.4  --   --   --   --   --   --   --    < > = values in this interval not displayed.   GFR Estimated Creatinine Clearance: 83.8 mL/min (by C-G formula based on SCr of 0.75 mg/dL). Liver Function Tests: No results for input(s): AST, ALT, ALKPHOS, BILITOT, PROT, ALBUMIN in the last 168 hours. No results for input(s): LIPASE, AMYLASE in the last 168 hours. No results for input(s): AMMONIA in the last 168 hours. Coagulation profile No results for input(s): INR, PROTIME in the last  168 hours.  CBC: Recent Labs  Lab 09/04/24 0500 09/04/24 0530 09/05/24 0545 09/06/24 0552 09/07/24 0023 09/07/24 0851  WBC 2.8* 2.9* 2.1* 3.4* 2.9* 1.9*  NEUTROABS 2.3  --  1.7  --  2.1  --   HGB 9.6* 9.6* 9.0* 8.8* 7.3* 8.3*  HCT 29.6* 30.2* 28.5* 27.5* 23.3* 26.3*  MCV 99.3 99.7 101.4* 100.7* 103.1* 101.9*  PLT 219 218 188 200 196 182   Cardiac Enzymes: No results for input(s): CKTOTAL, CKMB, CKMBINDEX, TROPONINI in the last 168 hours. BNP (last 3 results) No results for input(s): PROBNP in the last 8760 hours. CBG: No results for input(s): GLUCAP in the last 168 hours. D-Dimer: No results for input(s): DDIMER in the last 72 hours. Hgb A1c: No results for input(s): HGBA1C in the last 72 hours. Lipid Profile: No results for input(s): CHOL, HDL, LDLCALC, TRIG, CHOLHDL, LDLDIRECT in the last 72 hours. Thyroid  function studies: No results for input(s): TSH, T4TOTAL, T3FREE, THYROIDAB in the last 72 hours.  Invalid input(s): FREET3 Anemia work up: Recent Labs    09/05/24 0545  FERRITIN 214  TIBC 213*  IRON 35   Sepsis Labs: Recent Labs  Lab 09/03/24 2349 09/04/24 0500 09/04/24 0530 09/04/24 1603 09/04/24 1935 09/05/24 0545 09/06/24 0552 09/07/24 0023 09/07/24 0851  PROCALCITON  --  0.15  --   --   --   --   --  0.64  --   WBC  --  2.8*   < >  --   --  2.1* 3.4* 2.9* 1.9*  LATICACIDVEN 1.9  --   --  0.8 1.0  --   --  0.9  --    < > = values in this interval not displayed.    Microbiology Recent Results (from the past 240 hours)  Urine Culture (for pregnant, neutropenic or urologic patients or patients with an indwelling urinary catheter)     Status: Abnormal   Collection Time: 08/31/24  2:16 PM   Specimen: Urine, Clean Catch  Result Value Ref Range Status   Specimen Description   Final    URINE, CLEAN CATCH Performed at [facility], [address] [place], [place] [address]    Special Requests   Final     NONE Performed at [facility], [address] [place], [place] [address]    Culture MULTIPLE SPECIES PRESENT, SUGGEST RECOLLECTION (A)  Final   Report Status 09/02/2024 FINAL  Final  Blood Culture (routine x 2)     Status: None   Collection Time: 08/31/24  3:38 PM   Specimen: BLOOD  Result Value Ref Range Status   Specimen Description BLOOD BLOOD RIGHT ARM  Final   Special Requests   Final    BOTTLES DRAWN AEROBIC AND ANAEROBIC Blood Culture adequate volume   Culture   Final    NO GROWTH 5 DAYS Performed at [facility], [address], [place], [place] [address]    Report Status 09/05/2024 FINAL  Final  Blood Culture (routine x 2)     Status: Abnormal   Collection Time: 08/31/24  6:40 PM   Specimen: BLOOD  Result Value Ref Range Status   Specimen Description   Final    BLOOD BLOOD LEFT WRIST Performed at [facility], [address], [place], [place] [address]    Special Requests   Final    BOTTLES DRAWN AEROBIC AND ANAEROBIC Blood Culture adequate volume Performed at [facility], [address] [place], [place] [address]    Culture  Setup Time   Final    GRAM POSITIVE RODS ANAEROBIC BOTTLE ONLY CRITICAL RESULT CALLED TO, READ BACK BY AND VERIFIED WITH: JASON ROBBINS 09/05/24 0500 MW Performed at [facility], [address] [place], [place] [address]    Culture (A)  Final    CUTIBACTERIUM ACNES Standardized susceptibility testing for this organism is not available. Performed at [facility], [address], [place], [place] [address]    Report Status 09/07/2024 FINAL  Final  Respiratory (~20 pathogens) panel by PCR     Status: None   Collection Time: 09/03/24 10:25 AM   Specimen: Nasopharyngeal Swab; Respiratory  Result Value Ref Range Status   Adenovirus NOT DETECTED NOT DETECTED Final   Coronavirus 229E NOT DETECTED NOT DETECTED Final    Comment: (NOTE) The Coronavirus on the Respiratory Panel, DOES  NOT test for the novel  Coronavirus (2019 nCoV)    Coronavirus HKU1 NOT DETECTED NOT DETECTED Final   Coronavirus NL63 NOT DETECTED NOT DETECTED Final   Coronavirus OC43 NOT DETECTED NOT DETECTED Final   Metapneumovirus NOT DETECTED NOT DETECTED Final   Rhinovirus / Enterovirus NOT DETECTED NOT DETECTED Final   Influenza A NOT DETECTED NOT DETECTED Final   Influenza B NOT DETECTED NOT DETECTED Final   Parainfluenza Virus 1 NOT DETECTED NOT DETECTED Final   Parainfluenza Virus 2 NOT DETECTED NOT DETECTED Final   Parainfluenza Virus 3 NOT DETECTED NOT DETECTED Final   Parainfluenza Virus 4 NOT DETECTED NOT DETECTED Final   Respiratory Syncytial Virus NOT DETECTED NOT DETECTED Final   Bordetella pertussis NOT DETECTED NOT  DETECTED Final   Bordetella Parapertussis NOT DETECTED NOT DETECTED Final   Chlamydophila pneumoniae NOT DETECTED NOT DETECTED Final   Mycoplasma pneumoniae NOT DETECTED NOT DETECTED Final    Comment: Performed at [facility], [address], [place], [place] [address]  Urine Culture (for pregnant, neutropenic or urologic patients or patients with an indwelling urinary catheter)     Status: None   Collection Time: 09/03/24 11:55 AM   Specimen: Urine, Clean Catch  Result Value Ref Range Status   Specimen Description   Final    URINE, CLEAN CATCH Performed at [facility], [address] [place], [place] [address]    Special Requests   Final    NONE Performed at [facility], [address], [place], [place] [address]    Culture   Final    NO GROWTH Performed at [facility], [address] [place], [place] [address]    Report Status 09/04/2024 FINAL  Final  Culture, blood (single) w Reflex to ID Panel     Status: None (Preliminary result)   Collection Time: 09/03/24  7:46 PM   Specimen: BLOOD  Result Value Ref Range Status   Specimen Description BLOOD BLOOD RIGHT HAND  Final   Special Requests   Final    BOTTLES DRAWN AEROBIC  AND ANAEROBIC Blood Culture adequate volume   Culture   Final    NO GROWTH 4 DAYS Performed at [facility], [address] [place], [place] [address]    Report Status PENDING  Incomplete  Blood culture (single)     Status: None (Preliminary result)   Collection Time: 09/07/24 12:23 AM   Specimen: BLOOD LEFT ARM  Result Value Ref Range Status   Specimen Description BLOOD LEFT ARM  Final   Special Requests   Final    BOTTLES DRAWN AEROBIC AND ANAEROBIC Blood Culture results may not be optimal due to an inadequate volume of blood received in culture bottles   Culture   Final    NO GROWTH < 12 HOURS Performed at [facility], [address], [place], [place] [address]    Report Status PENDING  Incomplete  Blood Culture (routine x 2)     Status: None (Preliminary result)   Collection Time: 09/07/24  3:45 AM   Specimen: BLOOD  Result Value Ref Range Status   Specimen Description BLOOD BLOOD RIGHT FOREARM  Final   Special Requests   Final    BOTTLES DRAWN AEROBIC AND ANAEROBIC Blood Culture results may not be optimal due to an inadequate volume of blood received in culture bottles   Culture   Final    NO GROWTH < 12 HOURS Performed at [facility], [address] [place], [place] [address]    Report Status PENDING  Incomplete  Blood Culture (routine x 2)     Status: None (Preliminary result)   Collection Time: 09/07/24  3:55 AM   Specimen: BLOOD  Result Value Ref Range Status   Specimen Description BLOOD RIGHT ANTECUBITAL  Final   Special Requests   Final    BOTTLES DRAWN AEROBIC AND ANAEROBIC Blood Culture results may not be optimal due to an inadequate volume of blood received in culture bottles   Culture   Final    NO GROWTH < 12 HOURS Performed at [facility], [address] [place], [place] [address]    Report Status PENDING  Incomplete  Respiratory (~20 pathogens) panel by PCR  Status: Abnormal   Collection Time: 09/07/24  4:56  AM   Specimen: Nasopharyngeal Swab; Respiratory  Result Value Ref Range Status   Adenovirus NOT DETECTED NOT DETECTED Final   Coronavirus 229E NOT DETECTED NOT DETECTED Final    Comment: (NOTE) The Coronavirus on the Respiratory Panel, DOES NOT test for the novel  Coronavirus (2019 nCoV)    Coronavirus HKU1 NOT DETECTED NOT DETECTED Final   Coronavirus NL63 NOT DETECTED NOT DETECTED Final   Coronavirus OC43 NOT DETECTED NOT DETECTED Final   Metapneumovirus DETECTED (A) NOT DETECTED Final   Rhinovirus / Enterovirus NOT DETECTED NOT DETECTED Final   Influenza A NOT DETECTED NOT DETECTED Final   Influenza B NOT DETECTED NOT DETECTED Final   Parainfluenza Virus 1 NOT DETECTED NOT DETECTED Final   Parainfluenza Virus 2 NOT DETECTED NOT DETECTED Final   Parainfluenza Virus 3 NOT DETECTED NOT DETECTED Final   Parainfluenza Virus 4 NOT DETECTED NOT DETECTED Final   Respiratory Syncytial Virus NOT DETECTED NOT DETECTED Final   Bordetella pertussis NOT DETECTED NOT DETECTED Final   Bordetella Parapertussis NOT DETECTED NOT DETECTED Final   Chlamydophila pneumoniae NOT DETECTED NOT DETECTED Final   Mycoplasma pneumoniae NOT DETECTED NOT DETECTED Final    Comment: Performed at [facility], [address] [place], [place] [address]    Procedures and diagnostic studies:  CT Chest Wo Contrast Result Date: 09/07/2024 EXAM: CT CHEST WITHOUT CONTRAST 09/07/2024 03:02:30 AM TECHNIQUE: CT of the chest was performed without the administration of intravenous contrast. Multiplanar reformatted images are provided for review. Automated exposure control, iterative reconstruction, and/or weight based adjustment of the mA/kV was utilized to reduce the radiation dose to as low as reasonably achievable. COMPARISON: Chest radiograph earlier today. CLINICAL HISTORY: Respiratory illness, nondiagnostic xray. Pt complaints of shortness of breath and chest tightness. Pt reports she was discharged from the  hospital this morning. Pt reports she is not able to carry on a conversations without feeling short of breath. Pt reports she was admitted to hospital due to sepsis. FINDINGS: MEDIASTINUM: Trace pericardial effusion. Left arm PICC terminates in the mid SVC. The central airways are clear. LYMPH NODES: Subcentimeter mediastinal lymph nodes, likely reactive. No hilar or axillary lymphadenopathy. LUNGS AND PLEURA: Multifocal patchy right lung opacities, right lower lobe predominant, favoring multifocal pneumonia. Mild left basilar opacity, atelectasis versus pneumonia. Small bilateral pleural effusions. No pneumothorax. SOFT TISSUES/BONES: No acute abnormality of the bones or soft tissues. UPPER ABDOMEN: Limited images of the upper abdomen demonstrate mild hepatic steatosis. IMPRESSION: 1. Multifocal patchy right lung opacities, right lower lobe predominant, favoring multifocal pneumonia. 2. Mild left basilar opacity, atelectasis versus pneumonia. 3. Small bilateral pleural effusions. Electronically signed by: Pinkie Pebbles MD 09/07/2024 03:05 AM EDT RP Workstation: [id number]   DG Chest 1 View Result Date: 09/07/2024 EXAM: 1 VIEW(S) XRAY OF THE CHEST 09/07/2024 12:01:00 AM COMPARISON: 09/03/2024 CLINICAL HISTORY: 89973 Shortness of breath 10026. Pt presents to ER from home with complaints of shortness of breath and chest tightness. Pt reports she was discharged from the hospital this morning. Pt reports she is not able to carry on a conversations without feeling short of breath. Pt reports she ; was admitted to hospital due to sepsis. Pt also reports she thinks her PICC line is infected. Pt presents to ER from home with complaints of shortness of breath and chest tightness. Pt reports she was discharged from the hospital this morning. Pt reports she is not able to carry on a  conversations without feeling short of breath. Pt reports she ; was admitted to hospital due to sepsis. Pt also reports she thinks her PICC  line is infected. FINDINGS: LINES, TUBES AND DEVICES: Stable left arm PICC at the cavoatrial junction. LUNGS AND PLEURA: New patchy right lower lobe opacity, favoring pneumonia over atelectasis. No pulmonary edema. No pleural effusion. No pneumothorax. HEART AND MEDIASTINUM: No acute abnormality of the cardiac and mediastinal silhouettes. BONES AND SOFT TISSUES: No acute osseous abnormality. IMPRESSION: 1. New patchy right lower lobe opacity, favoring pneumonia over atelectasis. Electronically signed by: Pinkie Pebbles MD 09/07/2024 12:05 AM EDT RP Workstation: [id number]               LOS: 0 days   Louana Fontenot  [facility]   Pager on [email or web address]. If 7PM-7AM, please contact night-coverage at [email or web address]     09/07/2024, 3:49 PM

## 2024-09-07 NOTE — Sepsis Progress Note (Signed)
 Elink monitoring for the code sepsis protocol.

## 2024-09-07 NOTE — Assessment & Plan Note (Signed)
Replete and monitor Pharmacy consult for electrolyte management

## 2024-09-07 NOTE — Assessment & Plan Note (Signed)
 At baseline

## 2024-09-07 NOTE — Discharge Summary (Signed)
 Physician Discharge Summary   Patient: Monica Gonzalez MRN: [id number] DOB: May 08, 1976  Admit date:     08/31/2024  Discharge date: 09/06/2024  Discharge Physician: Cresencio Fairly   PCP: Epifanio Alm SQUIBB, MD   Recommendations at discharge:    F/up with outpt providers as requested  Discharge Diagnoses: Principal Problem:   UTI (urinary tract infection) Active Problems:   Internal jugular vein thrombosis, right (HCC)   Pulmonary embolism (HCC)   Asthma   SLE (systemic lupus erythematosus related syndrome) (HCC)   Fibromyalgia   Anxiety and depression   Anal squamous cell carcinoma (HCC)   Breast mass in female   VIN III (vulvar intraepithelial neoplasia III)   Obesity, Class III, BMI 40-49.9 (morbid obesity) [employer])  Hospital Course: Assessment and Plan:  48 y.o. 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 Patient admitted for UTI. Has PICC line in Left arm   10/23: transfer to med-surg, DC tele, wean off O2  Sepsis due to pneumonia Fever, tachycardia, leukopenia  Initially admitted for UTI (urinary tract infection) She had no fever on admission and was normotensive.  She was started on cefepime. UCX showed multiple species.  On Hospital day 3 she developed a fever up to 103, HR 110s, and BP 107/61. Ucx was obtained, Bcx were obtained. RPP which was neg was obtained.  Patient's antibiotics were broadened to meropenem.  -She underwent CT A/P 10/21 which showed moderate retained stool throughout the colon consistent with constipation. RLL pna.  Procalcitonin 0.15, noted to be lymphopenic, ALC  0.2K/uL.  Negative RPP, Negative HIV.  - Treated with IV cefepime, Vanco and flagyl - transitioned to PO Abx at DC - she had significant improvement with - afberile for > 2 days, no leukocytosis and she requested DC. She had appt with Dr Tamea (PCCM) at 11:30 am on the day of the DC   Hypotension  -Continue Midodrine 10 tid   Macrocytic anemia Hgb stable.  B12 decreased, Started supplementation   Hx of pulmonary embolism (HCC) and  Internal jugular vein thrombosis, right  -continue home Lovenox  80 mg bid   Asthma: Stable -Bronchodilators, as needed Mucinex    History of Lupus (systemic lupus erythematosus) (HCC): currently not taking medications.  Stable per patient. 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  Patient is following up with Dr. Babara of oncology.  Currently on chemotherapy and radiation therapy for anal cancer. - Temporarily hold Xeloda due to ongoing infection - Follow-up with Dr. 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  - Encourage losing weight - Exercise and healthy diet   She is at high risk for readmissions  30 Day Unplanned Readmission Risk Score    Flowsheet Row ED to Hosp-Admission (Discharged) from 08/31/2024 in [facility] 1C MEDICAL TELEMETRY  30 Day Unplanned  Readmission Risk Score (%) 28.69 Filed at 09/06/2024 0801    This score is the patient's risk of an unplanned readmission within 30 days of being discharged (0 -100%). The score is based on dignosis, age, lab data, medications, orders, and past utilization.   Low:  0-14.9    Medium: 15-21.9   High: 22-29.9   Extreme: 30 and above               Disposition: Home Diet recommendation:  Discharge Diet Orders (From admission, onward)     Start     Ordered   09/06/24 0000  Diet - low sodium heart healthy        09/06/24 0829           Carb modified diet DISCHARGE MEDICATION: Allergies as of 09/06/2024       Reactions   Prednisone Shortness Of Breath, Swelling   Patient has taken methylprednisolone  (Depo-Medrol ) and dexamethasone  (Decadron ) without any documented side effects or adverse reactions. She describes that she used to take prednisone due to her bronchial asthma and respiratory problems, but later on she received some prednisone which into locations caused her to have significant swelling and shortness of breath. For that reason, she was told to avoid the use of prednisone.   Silicone Hives, Itching        Medication List     STOP taking these medications    clonazePAM 0.5 MG tablet Commonly known as: KLONOPIN   diphenoxylate-atropine 2.5-0.025 MG tablet Commonly known as: Lomotil   ferrous sulfate 325 (65 FE) MG tablet   guaiFENesin -codeine  100-10 MG/5ML syrup   lidocaine  2 % solution Commonly known as: XYLOCAINE    loperamide 2 MG tablet Commonly known as: IMODIUM A-D   magic mouthwash (multi-ingredient) oral suspension   naproxen  500 MG EC tablet Commonly known as: EC NAPROSYN    nystatin  powder Commonly known as: MYCOSTATIN /NYSTOP    promethazine -dextromethorphan  6.25-15 MG/5ML syrup Commonly known as: PROMETHAZINE -DM   sucralfate  1 g tablet Commonly known as: Carafate    SUPER B COMPLEX PO   traZODone  50 MG tablet Commonly known as: DESYREL        TAKE these medications    albuterol  108 (90 Base) MCG/ACT inhaler Commonly known as: VENTOLIN  HFA Inhale 2 puffs into the lungs every 4 (four) hours as needed for wheezing or shortness of breath. What changed: Another medication with the same name was  removed. Continue taking this medication, and follow the directions you see here.   amoxicillin -clavulanate 500-125 MG tablet Commonly known as: Augmentin  Take 1 tablet by mouth 2 (two) times daily for 5 days.   ascorbic acid 500 MG tablet Commonly known as: VITAMIN C Take 500 mg by mouth daily.   benzonatate  200 MG capsule Commonly known as: TESSALON  Take 1 capsule (200 mg total) by mouth 3 (three) times daily as needed for cough.   BIOTIN PO Take by mouth daily.   budesonide  0.25 MG/2ML nebulizer solution Commonly known as: Pulmicort  Take 2 mLs (0.25 mg total) by nebulization 2 (two) times daily.   busPIRone  5 MG tablet Commonly known as: BUSPAR  Take 5 mg by mouth 2 (two) times daily.   capecitabine 500 MG tablet Commonly known as: XELODA Take 3 tablets (1,500 mg total) by mouth 2 (two) times daily after a meal. Take within 30 minutes after meals. Take only on days when you have radiation. Start on 08/29/24.   cholecalciferol 25 MCG (1000 UNIT) tablet Commonly known as: VITAMIN D3 Take 1,000 Units by mouth daily.  DULoxetine  30 MG capsule Commonly known as: CYMBALTA  Take 30 mg by mouth at bedtime.   enoxaparin  80 MG/0.8ML injection Commonly known as: Lovenox  Inject 0.8 mLs (80 mg total) into the skin every 12 (twelve) hours.   esomeprazole  40 MG capsule Commonly known as: NexIUM  Take 1 capsule (40 mg total) by mouth 2 (two) times daily before a meal.   fentaNYL  25 MCG/HR Commonly known as: DURAGESIC  Place 1 patch onto the skin every 3 (three) days.   FT Mucus Relief DM 30-600 MG Tb12 Take 1 tablet by mouth 2 (two) times daily as needed for up to 10 days for cough.   gabapentin  300 MG capsule Commonly known as: NEURONTIN  Take 600 mg by mouth 2 (two) times daily.   hydrocortisone 2.5 % rectal cream Commonly known as: Anusol-HC Place 1 Application rectally 2 (two) times daily.   ipratropium 17 MCG/ACT inhaler Commonly known as: ATROVENT  HFA Inhale 2 puffs  into the lungs every 6 (six) hours as needed for wheezing (COUGH).   ipratropium-albuterol  0.5-2.5 (3) MG/3ML Soln Commonly known as: DUONEB Take 3 mLs by nebulization 4 (four) times daily.   lidocaine  5 % Commonly known as: LIDODERM  1 patch daily.   Linzess 145 MCG Caps capsule Generic drug: linaclotide Take 145 mcg by mouth daily before breakfast.   LORazepam 0.5 MG tablet Commonly known as: ATIVAN Take 1 tablet (0.5 mg total) by mouth every 12 (twelve) hours as needed for anxiety (nausea).   midodrine 10 MG tablet Commonly known as: PROAMATINE Take 1 tablet (10 mg total) by mouth 3 (three) times daily with meals.   montelukast  10 MG tablet Commonly known as: Singulair  Take 1 tablet (10 mg total) by mouth See admin instructions. Take 1 tablet on the day prior to chemotherapy and take 1 tablet daily for 2 days after chemotherapy.   multivitamin with minerals Tabs tablet Take 1 tablet by mouth daily.   nystatin  100000 UNIT/ML suspension Commonly known as: MYCOSTATIN  Take 5 mLs (500,000 Units total) by mouth 4 (four) times daily. Swish and swallow   OLANZapine 5 MG tablet Commonly known as: ZYPREXA Take 1 tablet (5 mg total) by mouth at bedtime.   oxyCODONE  5 MG immediate release tablet Commonly known as: Oxy IR/ROXICODONE  Take 1 tablet (5 mg total) by mouth every 6 (six) hours as needed for severe pain (pain score 7-10) or breakthrough pain.   potassium chloride  SA 20 MEQ tablet Commonly known as: KLOR-CON  M Take 1 tablet (20 mEq total) by mouth daily.   QC TUMERIC COMPLEX PO Take by mouth daily.        Follow-up Information     Epifanio Alm SQUIBB, MD. Schedule an appointment as soon as possible for a visit in 1 week(s).   Specialty: Infectious Diseases Why: [facility] Discharge F/UP Contact information: [address] [place] [address] [phone]         Babara Call, MD. Schedule an appointment as soon as possible for a visit in 3 day(s).    Specialty: Oncology Why: [facility] Discharge F/UP Contact information: [address] [place] [address] [phone]         Tamea Dedra CROME, MD. Go on 09/06/2024.   Specialty: Pulmonary Disease Why: @ 11:30 am as scheduled, [facility] Discharge F/UP Contact information: [address] [place] [address] [place] [address] [phone]                Discharge Exam: Fredricka Weights   08/31/24 1405  Weight: 91.2 kg   Constitutional: In no distress.  Cardiovascular: S1, S2 normal. No lower extremity edema  Pulmonary: Non labored breathing on room air, no wheezing or rales.   Abdominal: Soft. Non distended and non tender Musculoskeletal: Normal range of motion.     Neurological: Alert and oriented to person, place, and time. Non focal  Skin: Diffusely warm to touch. Inguinal folds with macerated skin, white gel, intergluateal cleft and more inferior region with maceration  Condition at discharge: fair  The results of significant diagnostics from this hospitalization (including imaging, microbiology, ancillary and laboratory) are listed below for reference.   Imaging Studies: CT Chest Wo Contrast Result Date: 09/07/2024 EXAM: CT CHEST WITHOUT CONTRAST 09/07/2024 03:02:30 AM TECHNIQUE: CT of the chest was performed without the administration of intravenous contrast. Multiplanar reformatted images are provided for review. Automated exposure control, iterative reconstruction, and/or weight based adjustment of the mA/kV was utilized to reduce the radiation dose to as low as reasonably achievable. COMPARISON: Chest radiograph earlier today. CLINICAL HISTORY: Respiratory illness, nondiagnostic xray. Pt complaints of shortness of breath and chest tightness. Pt reports she was discharged from the hospital this morning. Pt reports she is not able to carry on a conversations without feeling short of breath. Pt reports she was admitted to hospital due to sepsis.  FINDINGS: MEDIASTINUM: Trace pericardial effusion. Left arm PICC terminates in the mid SVC. The central airways are clear. LYMPH NODES: Subcentimeter mediastinal lymph nodes, likely reactive. No hilar or axillary lymphadenopathy. LUNGS AND PLEURA: Multifocal patchy right lung opacities, right lower lobe predominant, favoring multifocal pneumonia. Mild left basilar opacity, atelectasis versus pneumonia. Small bilateral pleural effusions. No pneumothorax. SOFT TISSUES/BONES: No acute abnormality of the bones or soft tissues. UPPER ABDOMEN: Limited images of the upper abdomen demonstrate mild hepatic steatosis. IMPRESSION: 1. Multifocal patchy right lung opacities, right lower lobe predominant, favoring multifocal pneumonia. 2. Mild left basilar opacity, atelectasis versus pneumonia. 3. Small bilateral pleural effusions. Electronically signed by: Pinkie Pebbles MD 09/07/2024 03:05 AM EDT RP Workstation: [id number]   DG Chest 1 View Result Date: 09/07/2024 EXAM: 1 VIEW(S) XRAY OF THE CHEST 09/07/2024 12:01:00 AM COMPARISON: 09/03/2024 CLINICAL HISTORY: 89973 Shortness of breath 10026. Pt presents to ER from home with complaints of shortness of breath and chest tightness. Pt reports she was discharged from the hospital this morning. Pt reports she is not able to carry on a conversations without feeling short of breath. Pt reports she ; was admitted to hospital due to sepsis. Pt also reports she thinks her PICC line is infected. Pt presents to ER from home with complaints of shortness of breath and chest tightness. Pt reports she was discharged from the hospital this morning. Pt reports she is not able to carry on a conversations without feeling short of breath. Pt reports she ; was admitted to hospital due to sepsis. Pt also reports she thinks her PICC line is infected. FINDINGS: LINES, TUBES AND DEVICES: Stable left arm PICC at the cavoatrial junction. LUNGS AND PLEURA: New patchy right lower lobe opacity,  favoring pneumonia over atelectasis. No pulmonary edema. No pleural effusion. No pneumothorax. HEART AND MEDIASTINUM: No acute abnormality of the cardiac and mediastinal silhouettes. BONES AND SOFT TISSUES: No acute osseous abnormality. IMPRESSION: 1. New patchy right lower lobe opacity, favoring pneumonia over atelectasis. Electronically signed by: Pinkie Pebbles MD 09/07/2024 12:05 AM EDT RP Workstation: [id number]   US  Venous Img Lower Unilateral Right (DVT) Result Date: 09/05/2024 CLINICAL DATA:  Right lower extremity pain, edema  and warmth. History of prior DVT. EXAM: RIGHT LOWER EXTREMITY VENOUS DOPPLER ULTRASOUND TECHNIQUE: Gray-scale sonography with graded compression, as well as color Doppler and duplex ultrasound were performed to evaluate the lower extremity deep venous systems from the level of the common femoral vein and including the common femoral, femoral, profunda femoral, popliteal and calf veins including the posterior tibial, peroneal and gastrocnemius veins when visible. The superficial great saphenous vein was also interrogated. Spectral Doppler was utilized to evaluate flow at rest and with distal augmentation maneuvers in the common femoral, femoral and popliteal veins. COMPARISON:  None Available. FINDINGS: Contralateral Common Femoral Vein: Respiratory phasicity is normal and symmetric with the symptomatic side. No evidence of thrombus. Normal compressibility. Common Femoral Vein: No evidence of thrombus. Normal compressibility, respiratory phasicity and response to augmentation. Saphenofemoral Junction: No evidence of thrombus. Normal compressibility and flow on color Doppler imaging. Profunda Femoral Vein: No evidence of thrombus. Normal compressibility and flow on color Doppler imaging. Femoral Vein: No evidence of thrombus. Normal compressibility, respiratory phasicity and response to augmentation. Popliteal Vein: No evidence of thrombus. Normal compressibility, respiratory  phasicity and response to augmentation. Calf Veins: No evidence of thrombus. Normal compressibility and flow on color Doppler imaging. Superficial Great Saphenous Vein: No evidence of thrombus. Normal compressibility. Venous Reflux:  None. Other Findings: No evidence of superficial thrombophlebitis or abnormal fluid collection. IMPRESSION: No evidence of right lower extremity deep venous thrombosis. Electronically Signed   By: Marcey Moan M.D.   On: 09/05/2024 07:49   CT ABDOMEN PELVIS W CONTRAST Result Date: 09/03/2024 CLINICAL DATA:  Urinary tract infection, right breast cancer EXAM: CT ABDOMEN AND PELVIS WITH CONTRAST TECHNIQUE: Multidetector CT imaging of the abdomen and pelvis was performed using the standard protocol following bolus administration of intravenous contrast. RADIATION DOSE REDUCTION: This exam was performed according to the departmental dose-optimization program which includes automated exposure control, adjustment of the mA and/or kV according to patient size and/or use of iterative reconstruction technique. CONTRAST:  OMNIPAQUE  IOHEXOL  300 MG/ML  SOLN COMPARISON:  08/17/2024 FINDINGS: Lower chest: Right lower lobe airspace disease consistent with pneumonia. Trace right effusion. No pneumothorax. Skin thickening within the right breast likely post therapeutic in this patient with a known prior history of right breast cancer and lumpectomy. Hepatobiliary: No focal liver abnormality is seen. No gallstones, gallbladder wall thickening, or biliary dilatation. Pancreas: Unremarkable. No pancreatic ductal dilatation or surrounding inflammatory changes. Spleen: Normal in size without focal abnormality. Adrenals/Urinary Tract: Adrenal glands are unremarkable. Kidneys are normal, without renal calculi, focal lesion, or hydronephrosis. Bladder is unremarkable. Stomach/Bowel: No bowel obstruction or ileus. Moderate stool within the colon consistent with constipation. Normal appendix right  lower quadrant. No bowel wall thickening or inflammatory change. Vascular/Lymphatic: No significant vascular findings are present. No enlarged abdominal or pelvic lymph nodes. Reproductive: Uterus and bilateral adnexa are unremarkable. Other: Trace pelvic free fluid, nonspecific. No free intraperitoneal gas. No abdominal wall hernia. Musculoskeletal: Sequela of multiple subcutaneous injections within the anterior abdominal wall. No acute or destructive bony abnormalities. Reconstructed images demonstrate no additional findings. IMPRESSION: 1. Right lower lobe pneumonia. 2. Trace pelvic free fluid, likely physiologic. 3. Moderate retained stool throughout the colon consistent with constipation. 4. Likely post therapeutic skin thickening within the right breast in this patient with a known history of right breast cancer. Electronically Signed   By: Ozell Daring M.D.   On: 09/03/2024 20:39   DG Chest 1 View Result Date: 09/03/2024 CLINICAL DATA:  Pneumonia. EXAM: DG CHEST  1V COMPARISON:  08/31/2024 FINDINGS: Left-sided PICC line unchanged with tip over the SVC. Lungs are hypoinflated with minimal stable prominence of the central pulmonary vasculature. No acute consolidation or effusion. Cardiomediastinal silhouette and remainder the exam is unchanged. IMPRESSION: Hypoinflation without acute cardiopulmonary disease. Electronically Signed   By: Toribio Agreste M.D.   On: 09/03/2024 08:54   DG Chest Port 1 View Result Date: 08/31/2024 EXAM: 1 VIEW XRAY OF THE CHEST 08/31/2024 07:31:10 PM COMPARISON: 08/13/2024 CLINICAL HISTORY: Questionable sepsis - evaluate for abnormality. Appears to be some bleeding around the site; patient is concerned that dressing may need to be changed, states site did not appear that way when she was last scene at [facility] on Thursday. Ambulatory to triage, NAD noted at this time. FINDINGS: LINES, TUBES AND DEVICES: Interval removal of a right chest wall port-a-cath. Interval  placement of a left PICC with tip overlying the expected region of the distal superior vena cava. LUNGS AND PLEURA: Slightly low lung volumes. No focal pulmonary opacity. No pulmonary edema. No pleural effusion. No pneumothorax. HEART AND MEDIASTINUM: Slightly prominent cardiac silhouette likely due to AP portable technique and low lung volumes . No acute abnormality of the mediastinal silhouette. BONES AND SOFT TISSUES: No acute osseous abnormality. IMPRESSION: 1. Low lung volumes with no definite acute cardiopulmonary process. 2. Apparent cardiomegaly on AP portable; consider repeat PA and lateral chest radiographs for confirmation. Electronically signed by: Morgane Naveau MD 08/31/2024 07:34 PM EDT RP Workstation: [id number]   IR REMOVAL TUN ACCESS W/ PORT W/O FL MOD SED Result Date: 08/28/2024 INDICATION: 48 year old with malpositioned right jugular Port-A-Cath and thrombus in the right internal jugular vein. Patient needs removal of the Port-A-Cath and placement of a PICC line for treatment of anal cancer. EXAM: 1.  PICC LINE PLACEMENT WITH ULTRASOUND AND FLUOROSCOPIC GUIDANCE 2.  REMOVAL OF RIGHT CHEST PORT-A-CATH MEDICATIONS: No sedation ANESTHESIA/SEDATION: No sedation FLUOROSCOPY: Radiation Exposure Index (as provided by the fluoroscopic device): 8 mGy Kerma COMPLICATIONS: None immediate. PROCEDURE: The patient was advised of the possible risks and complications and agreed to undergo the procedure. The patient was then brought to the angiographic suite for the procedure. The left arm was prepped with chlorhexidine , draped in the usual sterile fashion using maximum barrier technique (cap and mask, sterile gown, sterile gloves, large sterile sheet, hand hygiene and cutaneous antiseptic). Local anesthesia was attained by infiltration with 1% lidocaine . Ultrasound demonstrated patency of the left basilic vein, and this was documented with an image. Under real-time ultrasound guidance, this vein was accessed  with a 21 gauge micropuncture needle and image documentation was performed. The needle was exchanged over a guidewire for a peel-away sheath through which a 39 cm 5 French single lumen power injectable PICC was advanced, and positioned with its tip at the lower SVC/right atrial junction. Fluoroscopy during the procedure and fluoro spot radiograph confirms appropriate catheter position. The catheter was flushed, secured to the skin, and covered with a sterile dressing. The right chest was prepped and draped in a sterile fashion. 1% lidocaine  with epinephrine  was utilized for local anesthesia. An incision was made over the previously healed surgical incision. Utilizing blunt dissection, the port catheter and reservoir were removed from the underlying subcutaneous tissue in their entirety. Retention sutures were removed. The pocket was irrigated with a copious amount of sterile normal saline. The subcutaneous tissue was closed with 3-0 Vicryl interrupted subcutaneous stitches. A 4-0 Vicryl running subcuticular stitch was utilized to approximate the skin. Dermabond was applied. Dressing  was placed. Fluoroscopic image was obtained after removal of the Port-A-Cath. IMPRESSION: Successful placement of a left arm PICC line with sonographic and fluoroscopic guidance. The catheter is ready for use. Successful removal of the right chest Port-A-Cath. Electronically Signed   By: Juliene Balder M.D.   On: 08/28/2024 16:45   IR PICC PLACEMENT LEFT >5 YRS INC IMG GUIDE Result Date: 08/28/2024 INDICATION: 48 year old with malpositioned right jugular Port-A-Cath and thrombus in the right internal jugular vein. Patient needs removal of the Port-A-Cath and placement of a PICC line for treatment of anal cancer. EXAM: 1.  PICC LINE PLACEMENT WITH ULTRASOUND AND FLUOROSCOPIC GUIDANCE 2.  REMOVAL OF RIGHT CHEST PORT-A-CATH MEDICATIONS: No sedation ANESTHESIA/SEDATION: No sedation FLUOROSCOPY: Radiation Exposure Index (as provided by the  fluoroscopic device): 8 mGy Kerma COMPLICATIONS: None immediate. PROCEDURE: The patient was advised of the possible risks and complications and agreed to undergo the procedure. The patient was then brought to the angiographic suite for the procedure. The left arm was prepped with chlorhexidine , draped in the usual sterile fashion using maximum barrier technique (cap and mask, sterile gown, sterile gloves, large sterile sheet, hand hygiene and cutaneous antiseptic). Local anesthesia was attained by infiltration with 1% lidocaine . Ultrasound demonstrated patency of the left basilic vein, and this was documented with an image. Under real-time ultrasound guidance, this vein was accessed with a 21 gauge micropuncture needle and image documentation was performed. The needle was exchanged over a guidewire for a peel-away sheath through which a 39 cm 5 French single lumen power injectable PICC was advanced, and positioned with its tip at the lower SVC/right atrial junction. Fluoroscopy during the procedure and fluoro spot radiograph confirms appropriate catheter position. The catheter was flushed, secured to the skin, and covered with a sterile dressing. The right chest was prepped and draped in a sterile fashion. 1% lidocaine  with epinephrine  was utilized for local anesthesia. An incision was made over the previously healed surgical incision. Utilizing blunt dissection, the port catheter and reservoir were removed from the underlying subcutaneous tissue in their entirety. Retention sutures were removed. The pocket was irrigated with a copious amount of sterile normal saline. The subcutaneous tissue was closed with 3-0 Vicryl interrupted subcutaneous stitches. A 4-0 Vicryl running subcuticular stitch was utilized to approximate the skin. Dermabond was applied. Dressing was placed. Fluoroscopic image was obtained after removal of the Port-A-Cath. IMPRESSION: Successful placement of a left arm PICC line with sonographic and  fluoroscopic guidance. The catheter is ready for use. Successful removal of the right chest Port-A-Cath. Electronically Signed   By: Juliene Balder M.D.   On: 08/28/2024 16:45   IR CV Line Injection Result Date: 08/21/2024 INDICATION: 48 year old female with a surgically placed right chest port catheter. Catheter is malfunctioning and the patient presents for port injection. EXAM: IR NON-TUNNELED CENTRAL VENOUS CATH PLC W IMG MEDICATIONS: None ANESTHESIA/SEDATION: None FLUOROSCOPY TIME:  Radiation exposure index: 10 mGy, air kerma COMPLICATIONS: None immediate. PROCEDURE: Informed written consent was obtained from the patient after a thorough discussion of the procedural risks, benefits and alternatives. All questions were addressed. Maximal Sterile Barrier Technique was utilized including caps, mask, sterile gowns, sterile gloves, sterile drape, hand hygiene and skin antiseptic. A timeout was performed prior to the initiation of the procedure. Fluoroscopic evaluation demonstrates a right IJ port catheter. The entry site into the internal jugular vein appears high and the external portion of the catheter tubing is redundant in the soft tissues of the right neck. Contrast was next injected.  The tip of the catheter overlies the right innominate vein. No contrast enters the vein at this point, the contrast tracks back along the catheter where it fills retrograde into the internal jugular vein. A large tubular filling defect is present within the right internal jugular vein consistent with thrombus. No contrast material passes into the innominate vein. Contrast material instead fills the anterior jugular vein an other venous collaterals draining into the left innominate vein. IMPRESSION: 1. Malpositioned right IJ approach port catheter with significant fibrin sheath completely occluding the catheter tip. 2. Occlusive thrombus is present within the right internal jugular vein. Recommend removal of this nonfunctional  device and placement of a PICC to allow for continued venous access. After several weeks, right upper extremity duplex venous ultrasound could be performed to assess patency of the right internal jugular vein and, if patent, placement of a new port catheter could be considered at that time. Electronically Signed   By: Wilkie Lent M.D.   On: 08/21/2024 15:26   CT ABDOMEN PELVIS W CONTRAST Result Date: 08/17/2024 EXAM: CT ABDOMEN AND PELVIS WITH CONTRAST 08/17/2024 11:29:22 PM TECHNIQUE: CT of the abdomen and pelvis was performed with the administration of intravenous contrast, (iohexol  (OMNIPAQUE ) 300 MG/ML solution). Multiplanar reformatted images are provided for review. Automated exposure control, iterative reconstruction, and/or weight-based adjustment of the mA/kV was utilized to reduce the radiation dose to as low as reasonably achievable. COMPARISON: Comparison made to June 05, 2024. CLINICAL HISTORY: N/V/D. Anal cancer. Patient presented to the ED from home via POV for loose stools that are green. Patient is nauseated with some abdominal pain. *tracking code: Bo* FINDINGS: LOWER CHEST: Visualized lung bases are clear. 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 The liver is otherwise unremarkable. GALLBLADDER AND BILE DUCTS: No mention of gallbladder or biliary ductal dilatation. SPLEEN: No acute abnormality. PANCREAS: No acute abnormality. ADRENAL GLANDS: No acute abnormality. KIDNEYS, URETERS AND BLADDER: No stones in the kidneys or ureters. No hydronephrosis. No perinephric or periureteral stranding. Urinary bladder is unremarkable. GI AND BOWEL: Appendix is normal. The stomach, small bowel, and large bowel are otherwise unremarkable. PERITONEUM AND RETROPERITONEUM: Multiple inflammatory-appearing subcutaneous nodules are seen within the anterior abdominal  wall, new since prior examination, likely related to subcutaneous injection. No ascites. No free air. VASCULATURE: Aorta is normal in caliber. LYMPH NODES: Heterogeneously enhancing enlarged left inguinal lymph node is again identified, measuring 2.4 cm in diameter, stable since prior examination. No additional pathologic adenopathy. REPRODUCTIVE ORGANS: No acute abnormality. BONES AND SOFT TISSUES: 3.3 x 5.0 cm complex fluid collection within the right breast, prior partial breast resection. No acute osseous abnormality. IMPRESSION: 1. No acute findings in the abdomen or pelvis. 2. 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 3. 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 No additional pathologic adenopathy or evidence of distal metastatic disease within the abdomen and pelvis. 4. Probable postsurgical changes within the right breast. Electronically signed by: Dorethia Molt MD 08/17/2024 11:43 PM EDT RP Workstation: [id number]   DG Chest 2 View Result Date: 08/13/2024 CLINICAL DATA:  Port-A-Cath placement. EXAM: CHEST - 2 VIEW COMPARISON:  July 29, 2024. FINDINGS: The heart size and mediastinal contours are within normal limits. Stable position of  right internal jugular Port-A-Cath tip in expected position of upper SVC. Both lungs are clear. The visualized skeletal structures are unremarkable. IMPRESSION: No active cardiopulmonary disease. Electronically Signed   By: Lynwood Landy Raddle M.D.   On: 08/13/2024 15:26    Microbiology: Results for orders placed or performed during the hospital encounter of 08/31/24  Urine Culture (for pregnant, neutropenic or urologic patients or patients with an indwelling urinary catheter)     Status: Abnormal   Collection Time: 08/31/24  2:16 PM    Specimen: Urine, Clean Catch  Result Value Ref Range Status   Specimen Description   Final    URINE, CLEAN CATCH Performed at [facility], [address], [place], [place] [address]    Special Requests   Final    NONE Performed at [facility], [address], [place], [place] [address]    Culture MULTIPLE SPECIES PRESENT, SUGGEST RECOLLECTION (A)  Final   Report Status 09/02/2024 FINAL  Final  Blood Culture (routine x 2)     Status: None   Collection Time: 08/31/24  3:38 PM   Specimen: BLOOD  Result Value Ref Range Status   Specimen Description BLOOD BLOOD RIGHT ARM  Final   Special Requests   Final    BOTTLES DRAWN AEROBIC AND ANAEROBIC Blood Culture adequate volume   Culture   Final    NO GROWTH 5 DAYS Performed at [facility], [address], [place], [place] [address]    Report Status 09/05/2024 FINAL  Final  Blood Culture (routine x 2)     Status: Abnormal   Collection Time: 08/31/24  6:40 PM   Specimen: BLOOD  Result Value Ref Range Status   Specimen Description   Final    BLOOD BLOOD LEFT WRIST Performed at [facility], [address] [place], [place] [address]    Special Requests   Final    BOTTLES DRAWN AEROBIC AND ANAEROBIC Blood Culture adequate volume Performed at [facility], [address] [place], [place] [address]    Culture  Setup Time   Final    GRAM POSITIVE RODS ANAEROBIC BOTTLE ONLY CRITICAL RESULT CALLED TO, READ BACK BY AND VERIFIED WITH: JASON ROBBINS 09/05/24 0500 MW Performed at [facility], [address] [place], [place] [address]    Culture (A)  Final    CUTIBACTERIUM ACNES Standardized susceptibility testing for this organism is not available. Performed at [facility], [address] [place], [place] [address]    Report Status 09/07/2024 FINAL  Final  Respiratory (~20 pathogens) panel by PCR     Status: None   Collection Time: 09/03/24 10:25 AM   Specimen:  Nasopharyngeal Swab; Respiratory  Result Value Ref Range Status   Adenovirus NOT DETECTED NOT DETECTED Final   Coronavirus 229E NOT DETECTED NOT DETECTED Final    Comment: (NOTE) The Coronavirus on the Respiratory Panel, DOES NOT test for the novel  Coronavirus (2019 nCoV)    Coronavirus HKU1 NOT DETECTED NOT DETECTED Final   Coronavirus NL63 NOT DETECTED NOT DETECTED Final   Coronavirus OC43 NOT DETECTED NOT DETECTED Final   Metapneumovirus NOT DETECTED NOT DETECTED Final   Rhinovirus / Enterovirus NOT DETECTED NOT DETECTED Final   Influenza A NOT DETECTED NOT DETECTED Final   Influenza B NOT DETECTED NOT DETECTED Final   Parainfluenza Virus 1 NOT DETECTED NOT DETECTED Final   Parainfluenza Virus 2 NOT DETECTED NOT DETECTED Final   Parainfluenza Virus 3 NOT  DETECTED NOT DETECTED Final   Parainfluenza Virus 4 NOT DETECTED NOT DETECTED Final   Respiratory Syncytial Virus NOT DETECTED NOT DETECTED Final   Bordetella pertussis NOT DETECTED NOT DETECTED Final   Bordetella Parapertussis NOT DETECTED NOT DETECTED Final   Chlamydophila pneumoniae NOT DETECTED NOT DETECTED Final   Mycoplasma pneumoniae NOT DETECTED NOT DETECTED Final    Comment: Performed at [facility], [address] [place], [place] [address]  Urine Culture (for pregnant, neutropenic or urologic patients or patients with an indwelling urinary catheter)     Status: None   Collection Time: 09/03/24 11:55 AM   Specimen: Urine, Clean Catch  Result Value Ref Range Status   Specimen Description   Final    URINE, CLEAN CATCH Performed at [facility], [address] [place], [place] [address]    Special Requests   Final    NONE Performed at [facility], [address] [place], [place] [address]    Culture   Final    NO GROWTH Performed at [facility], [address] [place], [place] [address]    Report Status 09/04/2024 FINAL  Final  Culture, blood (single) w Reflex to ID Panel      Status: None (Preliminary result)   Collection Time: 09/03/24  7:46 PM   Specimen: BLOOD  Result Value Ref Range Status   Specimen Description BLOOD BLOOD RIGHT HAND  Final   Special Requests   Final    BOTTLES DRAWN AEROBIC AND ANAEROBIC Blood Culture adequate volume   Culture   Final    NO GROWTH 4 DAYS Performed at [facility], [address] [place], [place] [address]    Report Status PENDING  Incomplete    Labs: CBC: Recent Labs  Lab 09/04/24 0500 09/04/24 0530 09/05/24 0545 09/06/24 0552 09/07/24 0023 09/07/24 0851  WBC 2.8* 2.9* 2.1* 3.4* 2.9* 1.9*  NEUTROABS 2.3  --  1.7  --  2.1  --   HGB 9.6* 9.6* 9.0* 8.8* 7.3* 8.3*  HCT 29.6* 30.2* 28.5* 27.5* 23.3* 26.3*  MCV 99.3 99.7 101.4* 100.7* 103.1* 101.9*  PLT 219 218 188 200 196 182   Basic Metabolic Panel: Recent Labs  Lab 09/02/24 0422 09/03/24 0615 09/04/24 0530 09/05/24 0545 09/06/24 0552 09/07/24 0023 09/07/24 0849 09/07/24 0851  NA 143   < > 142 142 141 138  --  138  K 4.0   < > 3.8 3.6 2.9* 2.9*  --  3.3*  CL 107   < > 101 105 107 102  --  103  CO2 27   < > 27 28 26 23   --  24  GLUCOSE 120*   < > 103* 146* 91 86  --  96  BUN 9   < > 7 10 13 10   --  9  CREATININE 0.70   < > 0.80 0.77 0.68 0.79  --  0.75  CALCIUM 8.3*   < > 8.5* 8.3* 8.1* 7.6*  --  7.7*  MG 1.9  --   --   --  1.9  --  1.7  --   PHOS 3.4  --   --   --   --   --   --   --    < > = values in this interval not displayed.   Liver Function Tests: No results for input(s): AST, ALT, ALKPHOS, BILITOT, PROT, ALBUMIN in the last 168 hours. CBG: No results for input(s): GLUCAP  in the last 168 hours.  Discharge time spent: greater than 30 minutes.  Signed: Cresencio Fairly, MD [facility] 09/07/2024

## 2024-09-07 NOTE — ED Notes (Signed)
 Lab called and agreed to come attempt lab draw for remaining cultures after multiple failed attempts for an IV blood draw. All questions and concerns from pt have been answered by this nurse. Blanket has been given to pt and pt has been made comfortable.

## 2024-09-07 NOTE — Plan of Care (Signed)
°  Problem: Education: °Goal: Knowledge of General Education information will improve °Description: Including pain rating scale, medication(s)/side effects and non-pharmacologic comfort measures °Outcome: Progressing °  °Problem: Clinical Measurements: °Goal: Respiratory complications will improve °Outcome: Progressing °  °Problem: Activity: °Goal: Risk for activity intolerance will decrease °Outcome: Progressing °  °Problem: Coping: °Goal: Level of anxiety will decrease °Outcome: Progressing °  °Problem: Elimination: °Goal: Will not experience complications related to bowel motility °Outcome: Progressing °  °

## 2024-09-07 NOTE — ED Provider Notes (Addendum)
 [facility] Provider Note    Event Date/Time   First MD Initiated Contact with Patient 09/06/24 2358     (approximate)   History   Shortness of Breath   HPI Monica Gonzalez is a 48 y.o. 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  She was recently admitted to the hospital for sepsis due to urinary tract infection and apparently developed pneumonia while she was in the hospital.  She was discharged yesterday from the hospital.  She presents tonight for worsening shortness of breath.  She said that since she went home her shortness of breath has not got better and then started getting worse.  She is now on oxygen but that is not making better.  Her cough is getting worse as well.  She also has concerns that something is wrong with the PICC line in her left arm.     Physical Exam   Triage Vital Signs: ED Triage Vitals  Encounter Vitals Group     BP 09/06/24 2330 (!) 108/58     Girls Systolic BP Percentile --      Girls Diastolic BP Percentile --      Boys Systolic BP Percentile --      Boys Diastolic BP Percentile --      Pulse Rate 09/06/24 2330 (!) 103     Resp 09/06/24 2330 14     Temp 09/06/24 2330 99.4 F (37.4 C)     Temp Source 09/06/24 2330 Oral     SpO2 09/06/24 2330 94 %     Weight 09/06/24 2331 91.2 kg (201 lb 1 oz)     Height 09/06/24 2331 1.499 m (4' 11)     Head Circumference --      Peak Flow --      Pain Score 09/06/24 2331 8     Pain Loc --      Pain Education --      Exclude from Growth Chart --     Most recent vital signs: Vitals:   09/07/24 0200 09/07/24 0230  BP: 117/73 (!) 111/51  Pulse: (!) 110 (!) 113  Resp:  16  Temp:    SpO2: 94% 95%    General: Awake and alert, conversant,  ill-appearing. CV:  Good peripheral perfusion.  Tachycardia, regular rhythm. Resp:  Normal effort. Speaking easily and comfortably but with frequent thick sounding cough, no accessory muscle usage nor intercostal retractions.  Coarse breath sounds, no wheezing. Abd:  No distention.  Other:  PICC line in left arm is still in place in left arm.   ED Results / Procedures / Treatments   Labs (all labs ordered are listed, but only abnormal results are displayed) Labs Reviewed  BASIC METABOLIC PANEL WITH GFR - Abnormal; Notable for the following components:      Result Value   Potassium 2.9 (*)    Calcium 7.6 (*)    All other components within normal limits  CBC WITH DIFFERENTIAL/PLATELET - Abnormal; Notable for the following components:   WBC 2.9 (*)    RBC 2.26 (*)    Hemoglobin 7.3 (*)    HCT 23.3 (*)    MCV 103.1 (*)    RDW 18.7 (*)    Lymphs Abs 0.3 (*)    All other components within normal limits  CULTURE, BLOOD (ROUTINE X 2)  CULTURE, BLOOD (ROUTINE X 2)  CULTURE, BLOOD (SINGLE)  LACTIC ACID, PLASMA  PROCALCITONIN  POC URINE PREG, ED     EKG  ED ECG REPORT I, Darleene Dome, the attending physician, personally viewed and interpreted this ECG.  Date: 09/06/2024 EKG Time: 23: 32 Rate: 102 Rhythm: Sinus tachycardia QRS Axis: normal Intervals: normal ST/T Wave abnormalities: Non-specific ST segment / T-wave changes, but no clear evidence of acute ischemia. Narrative Interpretation: no definitive evidence of acute ischemia; does not meet STEMI criteria.    RADIOLOGY See ED course for details   PROCEDURES:  Critical Care performed: Yes, see critical care procedure note(s)  .Critical Care  Performed by: Dome Darleene, MD Authorized by: Dome Darleene, MD   Critical care provider statement:    Critical care time (minutes):  45   Critical care time was exclusive of:  Separately billable procedures and treating other patients   Critical care was necessary to  treat or prevent imminent or life-threatening deterioration of the following conditions:  Sepsis and respiratory failure   Critical care was time spent personally by me on the following activities:  Development of treatment plan with patient or surrogate, evaluation of patient's response to treatment, examination of patient, obtaining history from patient or surrogate, ordering and performing treatments and interventions, ordering and review of laboratory studies, ordering and review of radiographic studies, pulse oximetry, re-evaluation of patient's condition and review of old charts .1-3 Lead EKG Interpretation  Performed by: Dome Darleene, MD Authorized by: Dome Darleene, MD     Interpretation: abnormal     ECG rate:  105   ECG rate assessment: tachycardic     Rhythm: sinus tachycardia     Ectopy: none     Conduction: normal       IMPRESSION / MDM / ASSESSMENT AND PLAN / ED COURSE  I reviewed the triage vital signs and the nursing notes.                              Differential diagnosis includes, but is not limited to, subacute pneumonia, worsening pneumonia/sepsis, worsening PE, electrolyte or metabolic abnormality, bacteremia.  Patient's presentation is most consistent with acute presentation with potential threat to life or bodily function.  Labs/studies ordered: I ordered standard sepsis labs/studies including the following: respiratory viral panel PCR swab, blood cultures x2, pro time-INR, CMP, urinalysis, urine culture, lactic acid, CBC with differential, high-sensitivity troponin, lipase, 1-view chest x-ray, EKG. I also ordered a third blood culture directly off the PICC line and a procalcitonin level.  Interventions/Medications given:  Medications  vancomycin (VANCOCIN) IVPB 1000 mg/200 mL premix (has no administration in time range)  meropenem (MERREM) 1 g in sodium chloride  0.9 % 100 mL IVPB (has no administration in time range)  lactated ringers  bolus 1,500 mL (has no  administration in time range)  potassium chloride  SA (KLOR-CON  M) CR tablet 40 mEq (has no administration in time range)    (Note:  hospital course my include additional interventions and/or labs/studies not listed above.)   Complicated patient and the patient was discharged from the hospital so recently that a discharge summary has not yet been written.  However, I reviewed the medical record extensively including the most recent progress note written by hospitalist Dr. Maree.  I have verified that the patient had been treated both for UTI and for pneumonia and that she had been treated with meropenem.  I do not know what antibiotic she was discharged on although presumably is using the PICC line.  Unclear if the patient is stable or if her symptoms represent a worsening of her symptoms.  I independently viewed and interpreted the chest x-ray and she has a obvious right lower lobe infiltrate, but this may be stable or even improving so I will proceed with a CT chest without contrast.  No indication to get a CTA since she is already being treated for PE.  The patient is on the cardiac monitor to evaluate for evidence of arrhythmia and/or significant heart rate changes.   Clinical Course as of 09/07/24 0347  Sat Sep 07, 2024  0310 CT Chest Wo Contrast I independently viewed and interpreted the patient's chest CT and there is significant pneumonia throughout multiple lobes but most notable with a consolidation in the right lower lobe.  I compared it to the lower lobes visible on the CT abdomen/pelvis obtained while she was in the hospital, and it looks substantially worse tonight. [CF]  0318 The patient's worsening leukopenia, heart rate greater than 100, and clearly worsening pneumonia on chest CT, as well as her ongoing immunosuppression from chemotherapy for cancer treatment, I am making the patient a code sepsis.  She does not presenting as severe sepsis or septic shock.  I added on a  procalcitonin level.  I ordered 1.5 L LR based on ideal body weight of 45 kg.  I ordered meropenem 1 g IV and vancomycin per pharmacy protocol because she had been on meropenem while hospitalized before her antibiotics were narrowed and spectrum. [CF]  0346 Hemoglobin is still above 7 but has dropped from baseline, white blood cell count has further dropped from the leukopenia that was noted during her hospitalization.  Procalcitonin has gone up to 0.64.  Potassium is low at 2.9 and I ordered 40 mill equivalents of potassium p.o. 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   Gordan Huxley, MD 09/07/24 9655    Gordan Huxley, MD 09/07/24 [phone]

## 2024-09-08 DIAGNOSIS — D611 Drug-induced aplastic anemia: Secondary | ICD-10-CM | POA: Diagnosis present

## 2024-09-08 DIAGNOSIS — E876 Hypokalemia: Secondary | ICD-10-CM | POA: Diagnosis present

## 2024-09-08 DIAGNOSIS — Z87412 Personal history of vulvar dysplasia: Secondary | ICD-10-CM | POA: Diagnosis not present

## 2024-09-08 DIAGNOSIS — A419 Sepsis, unspecified organism: Secondary | ICD-10-CM | POA: Diagnosis present

## 2024-09-08 DIAGNOSIS — G894 Chronic pain syndrome: Secondary | ICD-10-CM | POA: Diagnosis present

## 2024-09-08 DIAGNOSIS — Z6841 Body Mass Index (BMI) 40.0 and over, adult: Secondary | ICD-10-CM | POA: Diagnosis not present

## 2024-09-08 DIAGNOSIS — Z833 Family history of diabetes mellitus: Secondary | ICD-10-CM | POA: Diagnosis not present

## 2024-09-08 DIAGNOSIS — M797 Fibromyalgia: Secondary | ICD-10-CM | POA: Diagnosis present

## 2024-09-08 DIAGNOSIS — K529 Noninfective gastroenteritis and colitis, unspecified: Secondary | ICD-10-CM | POA: Diagnosis present

## 2024-09-08 DIAGNOSIS — F32A Depression, unspecified: Secondary | ICD-10-CM | POA: Diagnosis present

## 2024-09-08 DIAGNOSIS — D701 Agranulocytosis secondary to cancer chemotherapy: Secondary | ICD-10-CM | POA: Diagnosis present

## 2024-09-08 DIAGNOSIS — Z8249 Family history of ischemic heart disease and other diseases of the circulatory system: Secondary | ICD-10-CM | POA: Diagnosis not present

## 2024-09-08 DIAGNOSIS — C210 Malignant neoplasm of anus, unspecified: Secondary | ICD-10-CM | POA: Diagnosis present

## 2024-09-08 DIAGNOSIS — Z86718 Personal history of other venous thrombosis and embolism: Secondary | ICD-10-CM | POA: Diagnosis not present

## 2024-09-08 DIAGNOSIS — Z7951 Long term (current) use of inhaled steroids: Secondary | ICD-10-CM | POA: Diagnosis not present

## 2024-09-08 DIAGNOSIS — J189 Pneumonia, unspecified organism: Secondary | ICD-10-CM | POA: Diagnosis not present

## 2024-09-08 DIAGNOSIS — Z7901 Long term (current) use of anticoagulants: Secondary | ICD-10-CM | POA: Diagnosis not present

## 2024-09-08 DIAGNOSIS — J123 Human metapneumovirus pneumonia: Secondary | ICD-10-CM | POA: Diagnosis present

## 2024-09-08 DIAGNOSIS — M329 Systemic lupus erythematosus, unspecified: Secondary | ICD-10-CM | POA: Diagnosis present

## 2024-09-08 DIAGNOSIS — K219 Gastro-esophageal reflux disease without esophagitis: Secondary | ICD-10-CM | POA: Diagnosis present

## 2024-09-08 DIAGNOSIS — Z86711 Personal history of pulmonary embolism: Secondary | ICD-10-CM | POA: Diagnosis not present

## 2024-09-08 DIAGNOSIS — D84821 Immunodeficiency due to drugs: Secondary | ICD-10-CM | POA: Diagnosis present

## 2024-09-08 DIAGNOSIS — J9601 Acute respiratory failure with hypoxia: Secondary | ICD-10-CM | POA: Diagnosis present

## 2024-09-08 DIAGNOSIS — F419 Anxiety disorder, unspecified: Secondary | ICD-10-CM | POA: Diagnosis present

## 2024-09-08 DIAGNOSIS — E66813 Obesity, class 3: Secondary | ICD-10-CM | POA: Diagnosis present

## 2024-09-08 LAB — CBC WITH DIFFERENTIAL/PLATELET
Abs Immature Granulocytes: 0.02 K/uL (ref 0.00–0.07)
Basophils Absolute: 0 K/uL (ref 0.0–0.1)
Basophils Relative: 1 %
Eosinophils Absolute: 0.1 K/uL (ref 0.0–0.5)
Eosinophils Relative: 5 %
HCT: 27.7 % — ABNORMAL LOW (ref 36.0–46.0)
Hemoglobin: 8.5 g/dL — ABNORMAL LOW (ref 12.0–15.0)
Immature Granulocytes: 1 %
Lymphocytes Relative: 8 %
Lymphs Abs: 0.2 K/uL — ABNORMAL LOW (ref 0.7–4.0)
MCH: 31.3 pg (ref 26.0–34.0)
MCHC: 30.7 g/dL (ref 30.0–36.0)
MCV: 101.8 fL — ABNORMAL HIGH (ref 80.0–100.0)
Monocytes Absolute: 0.3 K/uL (ref 0.1–1.0)
Monocytes Relative: 12 %
Neutro Abs: 1.6 K/uL — ABNORMAL LOW (ref 1.7–7.7)
Neutrophils Relative %: 73 %
Platelets: 189 K/uL (ref 150–400)
RBC: 2.72 MIL/uL — ABNORMAL LOW (ref 3.87–5.11)
RDW: 19.1 % — ABNORMAL HIGH (ref 11.5–15.5)
WBC: 2.3 K/uL — ABNORMAL LOW (ref 4.0–10.5)
nRBC: 0 % (ref 0.0–0.2)

## 2024-09-08 LAB — RENAL FUNCTION PANEL
Albumin: 2.5 g/dL — ABNORMAL LOW (ref 3.5–5.0)
Anion gap: 10 (ref 5–15)
BUN: 8 mg/dL (ref 6–20)
CO2: 24 mmol/L (ref 22–32)
Calcium: 7.8 mg/dL — ABNORMAL LOW (ref 8.9–10.3)
Chloride: 103 mmol/L (ref 98–111)
Creatinine, Ser: 0.7 mg/dL (ref 0.44–1.00)
GFR, Estimated: >60 mL/min (ref >60)
Glucose, Bld: 93 mg/dL (ref 70–99)
Phosphorus: 3.4 mg/dL (ref 2.5–4.6)
Potassium: 3.5 mmol/L (ref 3.5–5.1)
Sodium: 137 mmol/L (ref 135–145)

## 2024-09-08 LAB — CULTURE, BLOOD (SINGLE)
Culture: NO GROWTH
Special Requests: ADEQUATE

## 2024-09-08 LAB — MRSA NEXT GEN BY PCR, NASAL: MRSA by PCR Next Gen: NOT DETECTED

## 2024-09-08 LAB — MAGNESIUM: Magnesium: 2 mg/dL (ref 1.7–2.4)

## 2024-09-08 MED ORDER — POTASSIUM CHLORIDE CRYS ER 20 MEQ PO TBCR
40.0000 meq | EXTENDED_RELEASE_TABLET | Freq: Every day | ORAL | Status: DC
  Administered 2024-09-08 – 2024-09-14 (×7): 40 meq via ORAL
  Filled 2024-09-08 (×7): qty 2

## 2024-09-08 MED ORDER — HYDROCODONE BIT-HOMATROP MBR 5-1.5 MG/5ML PO SOLN
5.0000 mL | Freq: Four times a day (QID) | ORAL | Status: DC | PRN
  Administered 2024-09-08 – 2024-09-14 (×15): 5 mL via ORAL
  Filled 2024-09-08 (×16): qty 5

## 2024-09-08 NOTE — Plan of Care (Signed)

## 2024-09-08 NOTE — Plan of Care (Signed)
  Problem: Education: Goal: Knowledge of General Education information will improve Description: Including pain rating scale, medication(s)/side effects and non-pharmacologic comfort measures Outcome: Progressing   Problem: Activity: Goal: Risk for activity intolerance will decrease Outcome: Progressing   Problem: Clinical Measurements: Goal: Respiratory complications will improve Outcome: Progressing   Problem: Coping: Goal: Level of anxiety will decrease Outcome: Progressing   Problem: Pain Managment: Goal: General experience of comfort will improve and/or be controlled Outcome: Progressing

## 2024-09-08 NOTE — Progress Notes (Signed)
 Progress Note    Monica Gonzalez  FMW:[id number] DOB: 1975-12-29  DOA: 09/06/2024 PCP: Epifanio Alm SQUIBB, MD      Brief Narrative:    Medical records reviewed and are as summarized below:  Monica Gonzalez is a 48 y.o. 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  She was initially treated with broad-spectrum antibiotics including IV cefepime, vancomycin, Flagyl meropenem.  She was discharged on Augmentin  on 09/06/2024. Unfortunately, she returned to the emergency department later in the evening on 09/06/2024 with complaints of frequent cough, shortness of breath and chest tightness.  Vital signs in the ED: Temperature 99.4 F, respiratory rate 14, pulse 103, BP 108/58, O2 sat 94% on room air.  O2 saturation subsequently dropped to 88% while she was sleeping.  Labs significant for WBC 2.9, hemoglobin 7.3, hematocrit 23.3, platelet 196, potassium 2.9, lactic acid normal (0.9) and procalcitonin 0.64.   Chest x-ray IMPRESSION: 1. New patchy right lower lobe opacity, favoring pneumonia over atelectasis.  CT chest without contrast IMPRESSION: 1. Multifocal patchy right lung opacities, right lower lobe predominant, favoring multifocal pneumonia. 2. Mild left basilar opacity, atelectasis versus pneumonia. 3. Small bilateral pleural effusions.    She was readmitted to the hospital for sepsis secondary to multifocal pneumonia.    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  (Class III obesity)   Sepsis secondary to multifocal pneumonia in an immunocompromised patient: Continue IV meropenem and vancomycin.  Start Hycodan syrup for severe cough.  She said Tessalon  Perles is not helping and she is okay with discontinuing oxycodone  to make room for Hycodan syrup. Respiratory viral panel positive for metapneumovirus.  This could be the cause of her pneumonia.  Possibly secondary bacterial involvement as well   Acute hypoxic respiratory failure: She is on 4 L/min oxygen via [place].  Wean off oxygen as able.     Hypokalemia: Improved.  Continue potassium repletion.   Acute on chronic anemia: Probably related to malignancy and chemoradiation.  H&H stable.  Hemoglobin up from 7.3-8.3-8.5. No indication for blood transfusion at this time.   Leukopenia: Probably due to sepsis.  WBC up from 1.9-2.3.   Anal cancer on chemotherapy and radiation therapy, history of right breast cancer s/p lumpectomy: Outpatient follow-up with oncologist.   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   Comorbidities include chronic pain, anxiety, depression, SLE   Diet Order             Diet regular  Room service appropriate? Yes; Fluid consistency: Thin  Diet effective now                                  Consultants: None  Procedures: None    Medications:    benzonatate   200 mg Oral  TID   budesonide   0.25 mg Nebulization BID   busPIRone   5 mg Oral BID   Chlorhexidine  Gluconate Cloth  6 each Topical Daily   DULoxetine   30 mg Oral QHS   enoxaparin   90 mg Subcutaneous Q12H   fentaNYL   1 patch Transdermal Q72H   gabapentin   600 mg Oral BID   ipratropium-albuterol   3 mL Nebulization QID   midodrine  10 mg Oral TID WC   OLANZapine  5 mg Oral QHS   pantoprazole  40 mg Oral Daily   Continuous Infusions:  meropenem (MERREM) IV Stopped (09/08/24 0455)   vancomycin 1,250 mg (09/08/24 0529)     Anti-infectives (From admission, onward)    Start     Dose/Rate Route Frequency Ordered Stop   09/08/24 0500  vancomycin (VANCOREADY) IVPB 1250 mg/250 mL        1,250 mg 166.7 mL/hr over 90 Minutes Intravenous Every 24 hours 09/07/24 0459     09/07/24 1200  meropenem (MERREM) 1 g in sodium chloride  0.9 % 100 mL IVPB        1 g 200 mL/hr over 30 Minutes Intravenous Every 8 hours 09/07/24 0441     09/07/24 0445  vancomycin (VANCOREADY) IVPB 2000 mg/400 mL        2,000 mg 200 mL/hr over 120 Minutes Intravenous  Once 09/07/24 0441 09/07/24 0714   09/07/24 0330  vancomycin (VANCOCIN) IVPB 1000 mg/200 mL premix  Status:  Discontinued        1,000 mg 200 mL/hr over 60 Minutes Intravenous  Once 09/07/24 0318 09/07/24 0440   09/07/24 0330  meropenem (MERREM) 1 g in sodium chloride  0.9 % 100 mL IVPB        1 g 200 mL/hr over 30 Minutes Intravenous  Once 09/07/24 0318 09/07/24 0455              Family Communication/Anticipated D/C date and plan/Code Status   DVT prophylaxis:      Code Status: Prior  Family Communication: Plan discussed with Tammy, aunt, at the bedside Disposition Plan: Plan to discharge home   Status is: Observation The patient will require care spanning > 2 midnights and should be moved to inpatient because: Sepsis from multifocal pneumonia       Subjective:   Interval events noted.  She complains of frequent cough, pleuritic chest pain and  shortness of breath.  Tessalon  Perles is not helping.  Tammy, aunt at bedside  Objective:    Vitals:   09/07/24 2026 09/08/24 0117 09/08/24 0506 09/08/24 0745  BP: 130/69 (!) 156/78 (!) 89/59 118/79  Pulse: 93 (!) 105 85 97  Resp: 17 18 18 18   Temp: 98.2 F (36.8 C) 99.4 F (37.4 C) 98.6 F (37 C) 99.2 F (37.3 C)  TempSrc: Oral Oral Oral Oral  SpO2: 94% 99% 95% 98%  Weight:      Height:       No data found.   Intake/Output Summary (Last 24 hours) at 09/08/2024 1206 Last data filed at 09/08/2024 0309 Gross per 24 hour  Intake 440 ml  Output --  Net 440 ml  Filed Weights   09/06/24 2331  Weight: 91.2 kg    Exam:  GEN: NAD SKIN: Warm and dry EYES: No pallor or icterus ENT: MMM CV: RRR PULM: Bilateral rhonchi, bibasilar rales ABD: soft, ND, NT, +BS CNS: AAO x 3, non focal EXT: No edema or tenderness       Data Reviewed:   I have personally reviewed following labs and imaging studies:  Labs: Labs show the following:   Basic Metabolic Panel: Recent Labs  Lab 09/02/24 0422 09/03/24 0615 09/05/24 0545 09/06/24 0552 09/07/24 0023 09/07/24 0849 09/07/24 0851 09/08/24 0500  NA 143   < > 142 141 138  --  138 137  K 4.0   < > 3.6 2.9* 2.9*  --  3.3* 3.5  CL 107   < > 105 107 102  --  103 103  CO2 27   < > 28 26 23   --  24 24  GLUCOSE 120*   < > 146* 91 86  --  96 93  BUN 9   < > 10 13 10   --  9 8  CREATININE 0.70   < > 0.77 0.68 0.79  --  0.75 0.70  CALCIUM 8.3*   < > 8.3* 8.1* 7.6*  --  7.7* 7.8*  MG 1.9  --   --  1.9  --  1.7  --  2.0  PHOS 3.4  --   --   --   --   --   --  3.4   < > = values in this interval not displayed.   GFR Estimated Creatinine Clearance: 83.8 mL/min (by C-G formula based on SCr of 0.7 mg/dL). Liver Function Tests: Recent Labs  Lab 09/08/24 0500  ALBUMIN 2.5*   No results for input(s): LIPASE, AMYLASE in the last 168 hours. No results for input(s): AMMONIA in the last 168 hours. Coagulation profile No  results for input(s): INR, PROTIME in the last 168 hours.  CBC: Recent Labs  Lab 09/04/24 0500 09/04/24 0530 09/05/24 0545 09/06/24 0552 09/07/24 0023 09/07/24 0851 09/08/24 0500  WBC 2.8*   < > 2.1* 3.4* 2.9* 1.9* 2.3*  NEUTROABS 2.3  --  1.7  --  2.1  --  1.6*  HGB 9.6*   < > 9.0* 8.8* 7.3* 8.3* 8.5*  HCT 29.6*   < > 28.5* 27.5* 23.3* 26.3* 27.7*  MCV 99.3   < > 101.4* 100.7* 103.1* 101.9* 101.8*  PLT 219   < > 188 200 196 182 189   < > = values in this interval not displayed.   Cardiac Enzymes: No results for input(s): CKTOTAL, CKMB, CKMBINDEX, TROPONINI in the last 168 hours. BNP (last 3 results) No results for input(s): PROBNP in the last 8760 hours. CBG: No results for input(s): GLUCAP in the last 168 hours. D-Dimer: No results for input(s): DDIMER in the last 72 hours. Hgb A1c: No results for input(s): HGBA1C in the last 72 hours. Lipid Profile: No results for input(s): CHOL, HDL, LDLCALC, TRIG, CHOLHDL, LDLDIRECT in the last 72 hours. Thyroid  function studies: No results for input(s): TSH, T4TOTAL, T3FREE, THYROIDAB in the last 72 hours.  Invalid input(s): FREET3 Anemia work up: No results for input(s): VITAMINB12, FOLATE, FERRITIN, TIBC, IRON, RETICCTPCT in the last 72 hours.  Sepsis Labs: Recent Labs  Lab 09/03/24 2349 09/04/24 0500 09/04/24 0530 09/04/24 1603 09/04/24 1935 09/05/24 0545 09/06/24 0552 09/07/24 0023 09/07/24 0851 09/08/24 0500  PROCALCITON  --  0.15  --   --   --   --   --  0.64  --   --   WBC  --  2.8*   < >  --   --    < > 3.4* 2.9* 1.9* 2.3*  LATICACIDVEN 1.9  --   --  0.8 1.0  --   --  0.9  --   --    < > = values in this interval not displayed.    Microbiology Recent Results (from the past 240 hours)  Urine Culture (for pregnant, neutropenic or urologic patients or patients with an indwelling urinary catheter)     Status: Abnormal   Collection Time: 08/31/24  2:16 PM    Specimen: Urine, Clean Catch  Result Value Ref Range Status   Specimen Description   Final    URINE, CLEAN CATCH Performed at [facility], [address], [place], [place] [address]    Special Requests   Final    NONE Performed at [facility], [address] [place], [place] [address]    Culture MULTIPLE SPECIES PRESENT, SUGGEST RECOLLECTION (A)  Final   Report Status 09/02/2024 FINAL  Final  Blood Culture (routine x 2)     Status: None   Collection Time: 08/31/24  3:38 PM   Specimen: BLOOD  Result Value Ref Range Status   Specimen Description BLOOD BLOOD RIGHT ARM  Final   Special Requests   Final    BOTTLES DRAWN AEROBIC AND ANAEROBIC Blood Culture adequate volume   Culture   Final    NO GROWTH 5 DAYS Performed at [facility], [address] [place], [place] [address]    Report Status 09/05/2024 FINAL  Final  Blood Culture (routine x 2)     Status: Abnormal   Collection Time: 08/31/24  6:40 PM   Specimen: BLOOD  Result Value Ref Range Status   Specimen Description   Final    BLOOD BLOOD LEFT WRIST Performed at [facility], [address], [place], [place] [address]    Special Requests   Final    BOTTLES DRAWN AEROBIC AND ANAEROBIC Blood Culture adequate volume Performed at [facility], [address] [place], [place] [address]    Culture  Setup Time   Final    GRAM POSITIVE RODS ANAEROBIC BOTTLE ONLY CRITICAL RESULT CALLED TO, READ BACK BY AND VERIFIED WITH: JASON ROBBINS 09/05/24 0500 MW Performed at [facility], [address] [place], [place] [address]    Culture (A)  Final    CUTIBACTERIUM ACNES Standardized susceptibility testing for this organism is not available. Performed at [facility], [address] [place], [place] [address]    Report Status 09/07/2024 FINAL  Final  Respiratory (~20 pathogens) panel by PCR     Status: None   Collection Time: 09/03/24 10:25 AM   Specimen:  Nasopharyngeal Swab; Respiratory  Result Value Ref Range Status   Adenovirus NOT DETECTED NOT DETECTED Final   Coronavirus 229E NOT DETECTED NOT DETECTED Final    Comment: (NOTE) The Coronavirus on the Respiratory Panel, DOES NOT test for the novel  Coronavirus (2019 nCoV)    Coronavirus HKU1 NOT DETECTED NOT DETECTED Final   Coronavirus NL63 NOT DETECTED NOT DETECTED Final   Coronavirus OC43 NOT DETECTED NOT DETECTED Final   Metapneumovirus NOT DETECTED NOT DETECTED Final   Rhinovirus / Enterovirus NOT DETECTED NOT DETECTED Final   Influenza A NOT DETECTED NOT DETECTED Final   Influenza B NOT DETECTED NOT DETECTED Final   Parainfluenza Virus 1 NOT  DETECTED NOT DETECTED Final   Parainfluenza Virus 2 NOT DETECTED NOT DETECTED Final   Parainfluenza Virus 3 NOT DETECTED NOT DETECTED Final   Parainfluenza Virus 4 NOT DETECTED NOT DETECTED Final   Respiratory Syncytial Virus NOT DETECTED NOT DETECTED Final   Bordetella pertussis NOT DETECTED NOT DETECTED Final   Bordetella Parapertussis NOT DETECTED NOT DETECTED Final   Chlamydophila pneumoniae NOT DETECTED NOT DETECTED Final   Mycoplasma pneumoniae NOT DETECTED NOT DETECTED Final    Comment: Performed at [facility], [address] [place], [place] [address]  Urine Culture (for pregnant, neutropenic or urologic patients or patients with an indwelling urinary catheter)     Status: None   Collection Time: 09/03/24 11:55 AM   Specimen: Urine, Clean Catch  Result Value Ref Range Status   Specimen Description   Final    URINE, CLEAN CATCH Performed at [facility], [address], [place], [place] [address]    Special Requests   Final    NONE Performed at [facility], [address] [place], [place] [address]    Culture   Final    NO GROWTH Performed at [facility], [address], [place], [place] [address]    Report Status 09/04/2024 FINAL  Final  Culture, blood (single) w Reflex to ID Panel      Status: None   Collection Time: 09/03/24  7:46 PM   Specimen: BLOOD  Result Value Ref Range Status   Specimen Description BLOOD BLOOD RIGHT HAND  Final   Special Requests   Final    BOTTLES DRAWN AEROBIC AND ANAEROBIC Blood Culture adequate volume   Culture   Final    NO GROWTH 5 DAYS Performed at [facility], [address], [place], [place] [address]    Report Status 09/08/2024 FINAL  Final  Blood culture (single)     Status: None (Preliminary result)   Collection Time: 09/07/24 12:23 AM   Specimen: BLOOD LEFT ARM  Result Value Ref Range Status   Specimen Description BLOOD LEFT ARM  Final   Special Requests   Final    BOTTLES DRAWN AEROBIC AND ANAEROBIC Blood Culture results may not be optimal due to an inadequate volume of blood received in culture bottles   Culture   Final    NO GROWTH 1 DAY Performed at [facility], [address], [place], [place] [address]    Report Status PENDING  Incomplete  Blood Culture (routine x 2)     Status: None (Preliminary result)   Collection Time: 09/07/24  3:45 AM   Specimen: BLOOD  Result Value Ref Range Status   Specimen Description BLOOD BLOOD RIGHT FOREARM  Final   Special Requests   Final    BOTTLES DRAWN AEROBIC AND ANAEROBIC Blood Culture results may not be optimal due to an inadequate volume of blood received in culture bottles   Culture   Final    NO GROWTH 1 DAY Performed at [facility], [address] [place], [place] [address]    Report Status PENDING  Incomplete  Blood Culture (routine x 2)     Status: None (Preliminary result)   Collection Time: 09/07/24  3:55 AM   Specimen: BLOOD  Result Value Ref Range Status   Specimen Description BLOOD RIGHT ANTECUBITAL  Final   Special Requests   Final    BOTTLES DRAWN AEROBIC AND ANAEROBIC Blood Culture results may not be optimal due to an inadequate volume  of blood received in culture bottles   Culture   Final    NO GROWTH 1 DAY Performed at  [facility], [address] [place], [place] [address]    Report Status PENDING  Incomplete  Respiratory (~20 pathogens) panel by PCR     Status: Abnormal   Collection Time: 09/07/24  4:56 AM   Specimen: Nasopharyngeal Swab; Respiratory  Result Value Ref Range Status   Adenovirus NOT DETECTED NOT DETECTED Final   Coronavirus 229E NOT DETECTED NOT DETECTED Final    Comment: (NOTE) The Coronavirus on the Respiratory Panel, DOES NOT test for the novel  Coronavirus (2019 nCoV)    Coronavirus HKU1 NOT DETECTED NOT DETECTED Final   Coronavirus NL63 NOT DETECTED NOT DETECTED Final   Coronavirus OC43 NOT DETECTED NOT DETECTED Final   Metapneumovirus DETECTED (A) NOT DETECTED Final   Rhinovirus / Enterovirus NOT DETECTED NOT DETECTED Final   Influenza A NOT DETECTED NOT DETECTED Final   Influenza B NOT DETECTED NOT DETECTED Final   Parainfluenza Virus 1 NOT DETECTED NOT DETECTED Final   Parainfluenza Virus 2 NOT DETECTED NOT DETECTED Final   Parainfluenza Virus 3 NOT DETECTED NOT DETECTED Final   Parainfluenza Virus 4 NOT DETECTED NOT DETECTED Final   Respiratory Syncytial Virus NOT DETECTED NOT DETECTED Final   Bordetella pertussis NOT DETECTED NOT DETECTED Final   Bordetella Parapertussis NOT DETECTED NOT DETECTED Final   Chlamydophila pneumoniae NOT DETECTED NOT DETECTED Final   Mycoplasma pneumoniae NOT DETECTED NOT DETECTED Final    Comment: Performed at [facility], [address] [place], [place] [address]    Procedures and diagnostic studies:  CT Chest Wo Contrast Result Date: 09/07/2024 EXAM: CT CHEST WITHOUT CONTRAST 09/07/2024 03:02:30 AM TECHNIQUE: CT of the chest was performed without the administration of intravenous contrast. Multiplanar reformatted images are provided for review. Automated exposure control, iterative reconstruction, and/or weight based adjustment of the mA/kV was utilized to reduce the radiation dose to as low as reasonably achievable.  COMPARISON: Chest radiograph earlier today. CLINICAL HISTORY: Respiratory illness, nondiagnostic xray. Pt complaints of shortness of breath and chest tightness. Pt reports she was discharged from the hospital this morning. Pt reports she is not able to carry on a conversations without feeling short of breath. Pt reports she was admitted to hospital due to sepsis. FINDINGS: MEDIASTINUM: Trace pericardial effusion. Left arm PICC terminates in the mid SVC. The central airways are clear. LYMPH NODES: Subcentimeter mediastinal lymph nodes, likely reactive. No hilar or axillary lymphadenopathy. LUNGS AND PLEURA: Multifocal patchy right lung opacities, right lower lobe predominant, favoring multifocal pneumonia. Mild left basilar opacity, atelectasis versus pneumonia. Small bilateral pleural effusions. No pneumothorax. SOFT TISSUES/BONES: No acute abnormality of the bones or soft tissues. UPPER ABDOMEN: Limited images of the upper abdomen demonstrate mild hepatic steatosis. IMPRESSION: 1. Multifocal patchy right lung opacities, right lower lobe predominant, favoring multifocal pneumonia. 2. Mild left basilar opacity, atelectasis versus pneumonia. 3. Small bilateral pleural effusions. Electronically signed by: Pinkie Pebbles MD 09/07/2024 03:05 AM EDT RP Workstation: [id number]   DG Chest 1 View Result Date: 09/07/2024 EXAM: 1 VIEW(S) XRAY OF THE CHEST 09/07/2024 12:01:00 AM COMPARISON: 09/03/2024 CLINICAL HISTORY: 89973 Shortness of breath 10026. Pt presents to ER from home with complaints of shortness of breath and chest tightness. Pt reports she was discharged from the hospital this morning. Pt reports she is not able to carry on a conversations without feeling short of breath. Pt reports she ; was admitted  to hospital due to sepsis. Pt also reports she thinks her PICC line is infected. Pt presents to ER from home with complaints of shortness of breath and chest tightness. Pt reports she was discharged from the  hospital this morning. Pt reports she is not able to carry on a conversations without feeling short of breath. Pt reports she ; was admitted to hospital due to sepsis. Pt also reports she thinks her PICC line is infected. FINDINGS: LINES, TUBES AND DEVICES: Stable left arm PICC at the cavoatrial junction. LUNGS AND PLEURA: New patchy right lower lobe opacity, favoring pneumonia over atelectasis. No pulmonary edema. No pleural effusion. No pneumothorax. HEART AND MEDIASTINUM: No acute abnormality of the cardiac and mediastinal silhouettes. BONES AND SOFT TISSUES: No acute osseous abnormality. IMPRESSION: 1. New patchy right lower lobe opacity, favoring pneumonia over atelectasis. Electronically signed by: Pinkie Pebbles MD 09/07/2024 12:05 AM EDT RP Workstation: [id number]               LOS: 0 days   Milayna Rotenberg  [facility]   Pager on [email or web address]. If 7PM-7AM, please contact night-coverage at [email or web address]     09/08/2024, 12:06 PM

## 2024-09-09 ENCOUNTER — Ambulatory Visit

## 2024-09-09 ENCOUNTER — Ambulatory Visit: Admitting: Nurse Practitioner

## 2024-09-09 DIAGNOSIS — J189 Pneumonia, unspecified organism: Secondary | ICD-10-CM | POA: Diagnosis not present

## 2024-09-09 DIAGNOSIS — A419 Sepsis, unspecified organism: Secondary | ICD-10-CM | POA: Diagnosis not present

## 2024-09-09 LAB — RENAL FUNCTION PANEL
Albumin: 2.3 g/dL — ABNORMAL LOW (ref 3.5–5.0)
Anion gap: 7 (ref 5–15)
BUN: 7 mg/dL (ref 6–20)
CO2: 25 mmol/L (ref 22–32)
Calcium: 7.8 mg/dL — ABNORMAL LOW (ref 8.9–10.3)
Chloride: 103 mmol/L (ref 98–111)
Creatinine, Ser: 0.67 mg/dL (ref 0.44–1.00)
GFR, Estimated: >60 mL/min (ref >60)
Glucose, Bld: 114 mg/dL — ABNORMAL HIGH (ref 70–99)
Phosphorus: 2.7 mg/dL (ref 2.5–4.6)
Potassium: 4.1 mmol/L (ref 3.5–5.1)
Sodium: 135 mmol/L (ref 135–145)

## 2024-09-09 LAB — CBC
HCT: 26.2 % — ABNORMAL LOW (ref 36.0–46.0)
Hemoglobin: 8.1 g/dL — ABNORMAL LOW (ref 12.0–15.0)
MCH: 31.6 pg (ref 26.0–34.0)
MCHC: 30.9 g/dL (ref 30.0–36.0)
MCV: 102.3 fL — ABNORMAL HIGH (ref 80.0–100.0)
Platelets: 163 K/uL (ref 150–400)
RBC: 2.56 MIL/uL — ABNORMAL LOW (ref 3.87–5.11)
RDW: 18.6 % — ABNORMAL HIGH (ref 11.5–15.5)
WBC: 2.4 K/uL — ABNORMAL LOW (ref 4.0–10.5)
nRBC: 0 % (ref 0.0–0.2)

## 2024-09-09 LAB — MAGNESIUM: Magnesium: 2.1 mg/dL (ref 1.7–2.4)

## 2024-09-09 MED ORDER — HYDROCORTISONE 1 % EX CREA
TOPICAL_CREAM | Freq: Two times a day (BID) | CUTANEOUS | Status: DC
  Filled 2024-09-09 (×2): qty 28

## 2024-09-09 NOTE — Progress Notes (Signed)
 Progress Note    Monica Gonzalez  FMW:[id number] DOB: 1976-09-08  DOA: 09/06/2024 PCP: Epifanio Alm SQUIBB, MD      Brief Narrative:    Medical records reviewed and are as summarized below:  Monica Gonzalez is a 48 y.o. 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  She was initially treated with broad-spectrum antibiotics including IV cefepime, vancomycin, Flagyl meropenem.  She was discharged on Augmentin  on 09/06/2024. Unfortunately, she returned to the emergency department later in the evening on 09/06/2024 with complaints of frequent cough, shortness of breath and chest tightness.  Vital signs in the ED: Temperature 99.4 F, respiratory rate 14, pulse 103, BP 108/58, O2 sat 94% on room air.  O2 saturation subsequently dropped to 88% while she was sleeping.  Labs significant for WBC 2.9, hemoglobin 7.3, hematocrit 23.3, platelet 196, potassium 2.9, lactic acid normal (0.9) and procalcitonin 0.64.   Chest x-ray IMPRESSION: 1. New patchy right lower lobe opacity, favoring pneumonia over atelectasis.  CT chest without contrast IMPRESSION: 1. Multifocal patchy right lung opacities, right lower lobe predominant, favoring multifocal pneumonia. 2. Mild left basilar opacity, atelectasis versus pneumonia. 3. Small bilateral pleural effusions.    She was readmitted to the hospital for sepsis secondary to multifocal pneumonia.    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  (Class III obesity)   Sepsis secondary to multifocal pneumonia in an immunocompromised patient: MRSA screen negative.  Discontinue IV vancomycin.  Continue IV meropenem for total of 5 days. Continue Hycodan syrup for cough.  Continue bronchodilators as needed. Respiratory viral panel positive for metapneumovirus.  This could be the cause of her pneumonia.  Possibly secondary bacterial involvement as well   Acute hypoxic respiratory failure: Continue 4 L/min oxygen via [place] and wean off oxygen as able.     Hypokalemia: Improved   Acute on chronic anemia: Probably related to malignancy and chemoradiation.  H&H stable.  Hemoglobin up from 7.3-8.3-8.5-8.1. No indication for blood transfusion at this time.   Leukopenia: Probably due to sepsis.  WBC up from 1.9-2.3-2.4.   Anal cancer on chemotherapy and radiation therapy, history of right breast cancer s/p lumpectomy: Outpatient follow-up with oncologist.   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  Lomotil as needed for diarrhea   History of PE, DVT right internal jugular and right subclavian vein in September 2025): Continue therapeutic dose Lovenox .   Comorbidities include chronic pain, anxiety, depression, SLE, history of aspirin   Diet Order             Diet regular Room service appropriate? Yes; Fluid consistency:  Thin  Diet effective now                                  Consultants: None  Procedures: None    Medications:    benzonatate   200 mg Oral TID    budesonide   0.25 mg Nebulization BID   busPIRone   5 mg Oral BID   Chlorhexidine  Gluconate Cloth  6 each Topical Daily   DULoxetine   30 mg Oral QHS   enoxaparin   90 mg Subcutaneous Q12H   fentaNYL   1 patch Transdermal Q72H   gabapentin   600 mg Oral BID   ipratropium-albuterol   3 mL Nebulization QID   midodrine  10 mg Oral TID WC   OLANZapine  5 mg Oral QHS   pantoprazole  40 mg Oral Daily   potassium chloride   40 mEq Oral Daily   Continuous Infusions:  meropenem (MERREM) IV 1 g (09/09/24 1311)     Anti-infectives (From admission, onward)    Start     Dose/Rate Route Frequency Ordered Stop   09/08/24 0500  vancomycin (VANCOREADY) IVPB 1250 mg/250 mL  Status:  Discontinued        1,250 mg 166.7 mL/hr over 90 Minutes Intravenous Every 24 hours 09/07/24 0459 09/09/24 1001   09/07/24 1200  meropenem (MERREM) 1 g in sodium chloride  0.9 % 100 mL IVPB        1 g 200 mL/hr over 30 Minutes Intravenous Every 8 hours 09/07/24 0441 09/11/24 2359   09/07/24 0445  vancomycin (VANCOREADY) IVPB 2000 mg/400 mL        2,000 mg 200 mL/hr over 120 Minutes Intravenous  Once 09/07/24 0441 09/07/24 0714   09/07/24 0330  vancomycin (VANCOCIN) IVPB 1000 mg/200 mL premix  Status:  Discontinued        1,000 mg 200 mL/hr over 60 Minutes Intravenous  Once 09/07/24 0318 09/07/24 0440   09/07/24 0330  meropenem (MERREM) 1 g in sodium chloride  0.9 % 100 mL IVPB        1 g 200 mL/hr over 30 Minutes Intravenous  Once 09/07/24 0318 09/07/24 0455              Family Communication/Anticipated D/C date and plan/Code Status   DVT prophylaxis:      Code Status: Prior  Family Communication: Plan discussed with Tammy, aunt, at the bedside Disposition Plan: Plan to discharge home   Status is: Inpatient Remains inpatient appropriate because: Sepsis from multifocal pneumonia         Subjective:   Interval events noted.  She complains of persistent cough, shortness of breath and mild pleuritic  chest pain from coughing.  Objective:    Vitals:   09/08/24 1948 09/09/24 0444 09/09/24 0733 09/09/24 1017  BP: 127/77 (!) 118/99 (!) 89/51 112/65  Pulse: 99 94 94   Resp: 16 18 16    Temp: 99 F (37.2 C) 98.2 F (36.8 C) 98 F (36.7 C)   TempSrc:   Oral   SpO2: 96% (!) 89% 97%   Weight:      Height:       No data found.   Intake/Output Summary (Last 24 hours) at 09/09/2024 1333 Last data filed at 09/09/2024 0900 Gross per 24 hour  Intake 905.99 ml  Output --  Net 905.99 ml   Filed Weights   09/06/24 2331  Weight: 91.2 kg    Exam:  GEN: NAD SKIN: Warm and  dry EYES: No pallor or icterus ENT: MMM CV: RRR PULM: Mild bilateral rhonchi ABD: soft, obese, NT, +BS CNS: AAO x 3, non focal EXT: No edema or tenderness        Data Reviewed:   I have personally reviewed following labs and imaging studies:  Labs: Labs show the following:   Basic Metabolic Panel: Recent Labs  Lab 09/06/24 0552 09/07/24 0023 09/07/24 0849 09/07/24 0851 09/08/24 0500 09/09/24 0400  NA 141 138  --  138 137 135  K 2.9* 2.9*  --  3.3* 3.5 4.1  CL 107 102  --  103 103 103  CO2 26 23  --  24 24 25   GLUCOSE 91 86  --  96 93 114*  BUN 13 10  --  9 8 7   CREATININE 0.68 0.79  --  0.75 0.70 0.67  CALCIUM 8.1* 7.6*  --  7.7* 7.8* 7.8*  MG 1.9  --  1.7  --  2.0 2.1  PHOS  --   --   --   --  3.4 2.7   GFR Estimated Creatinine Clearance: 83.8 mL/min (by C-G formula based on SCr of 0.67 mg/dL). Liver Function Tests: Recent Labs  Lab 09/08/24 0500 09/09/24 0400  ALBUMIN 2.5* 2.3*   No results for input(s): LIPASE, AMYLASE in the last 168 hours. No results for input(s): AMMONIA in the last 168 hours. Coagulation profile No results for input(s): INR, PROTIME in the last 168 hours.  CBC: Recent Labs  Lab 09/04/24 0500 09/04/24 0530 09/05/24 0545 09/06/24 0552 09/07/24 0023 09/07/24 0851 09/08/24 0500 09/09/24 0400  WBC 2.8*   < > 2.1* 3.4* 2.9* 1.9* 2.3*  2.4*  NEUTROABS 2.3  --  1.7  --  2.1  --  1.6*  --   HGB 9.6*   < > 9.0* 8.8* 7.3* 8.3* 8.5* 8.1*  HCT 29.6*   < > 28.5* 27.5* 23.3* 26.3* 27.7* 26.2*  MCV 99.3   < > 101.4* 100.7* 103.1* 101.9* 101.8* 102.3*  PLT 219   < > 188 200 196 182 189 163   < > = values in this interval not displayed.   Cardiac Enzymes: No results for input(s): CKTOTAL, CKMB, CKMBINDEX, TROPONINI in the last 168 hours. BNP (last 3 results) No results for input(s): PROBNP in the last 8760 hours. CBG: No results for input(s): GLUCAP in the last 168 hours. D-Dimer: No results for input(s): DDIMER in the last 72 hours. Hgb A1c: No results for input(s): HGBA1C in the last 72 hours. Lipid Profile: No results for input(s): CHOL, HDL, LDLCALC, TRIG, CHOLHDL, LDLDIRECT in the last 72 hours. Thyroid  function studies: No results for input(s): TSH, T4TOTAL, T3FREE, THYROIDAB in the last 72 hours.  Invalid input(s): FREET3 Anemia work up: No results for input(s): VITAMINB12, FOLATE, FERRITIN, TIBC, IRON, RETICCTPCT in the last 72 hours.  Sepsis Labs: Recent Labs  Lab 09/03/24 2349 09/04/24 0500 09/04/24 0530 09/04/24 1603 09/04/24 1935 09/05/24 0545 09/07/24 0023 09/07/24 0851 09/08/24 0500 09/09/24 0400  PROCALCITON  --  0.15  --   --   --   --  0.64  --   --   --   WBC  --  2.8*   < >  --   --    < > 2.9* 1.9* 2.3* 2.4*  LATICACIDVEN 1.9  --   --  0.8 1.0  --  0.9  --   --   --    < > = values in  this interval not displayed.    Microbiology Recent Results (from the past 240 hours)  Urine Culture (for pregnant, neutropenic or urologic patients or patients with an indwelling urinary catheter)     Status: Abnormal   Collection Time: 08/31/24  2:16 PM   Specimen: Urine, Clean Catch  Result Value Ref Range Status   Specimen Description   Final    URINE, CLEAN CATCH Performed at [facility], [address] [place] [address] [place], [place] [address]     Special Requests   Final    NONE Performed at [facility], [address] [place], [place] [address]    Culture MULTIPLE SPECIES PRESENT, SUGGEST RECOLLECTION (A)  Final   Report Status 09/02/2024 FINAL  Final  Blood Culture (routine x 2)     Status: None   Collection Time: 08/31/24  3:38 PM   Specimen: BLOOD  Result Value Ref Range Status   Specimen Description BLOOD BLOOD RIGHT ARM  Final   Special Requests   Final    BOTTLES DRAWN AEROBIC AND ANAEROBIC Blood Culture adequate volume   Culture   Final    NO GROWTH 5 DAYS Performed at [facility], [address] [place], [place] [address]    Report Status 09/05/2024 FINAL  Final  Blood Culture (routine x 2)     Status: Abnormal   Collection Time: 08/31/24  6:40 PM   Specimen: BLOOD  Result Value Ref Range Status   Specimen Description   Final    BLOOD BLOOD LEFT WRIST Performed at [facility], [address], [place], [place] [address]    Special Requests   Final    BOTTLES DRAWN AEROBIC AND ANAEROBIC Blood Culture adequate volume Performed at [facility], [address] [place], [place] [address]    Culture  Setup Time   Final    GRAM POSITIVE RODS ANAEROBIC BOTTLE ONLY CRITICAL RESULT CALLED TO, READ BACK BY AND VERIFIED WITH: JASON ROBBINS 09/05/24 0500 MW Performed at [facility], [address] [place], [place] [address]    Culture (A)  Final    CUTIBACTERIUM ACNES Standardized susceptibility testing for this organism is not available. Performed at [facility], [address], [place], [place] [address]    Report Status 09/07/2024 FINAL  Final  Respiratory (~20 pathogens) panel by PCR     Status: None   Collection Time: 09/03/24 10:25 AM   Specimen: Nasopharyngeal Swab; Respiratory  Result Value Ref Range Status   Adenovirus NOT DETECTED NOT DETECTED Final   Coronavirus 229E NOT DETECTED NOT DETECTED Final    Comment: (NOTE) The Coronavirus on the  Respiratory Panel, DOES NOT test for the novel  Coronavirus (2019 nCoV)    Coronavirus HKU1 NOT DETECTED NOT DETECTED Final   Coronavirus NL63 NOT DETECTED NOT DETECTED Final   Coronavirus OC43 NOT DETECTED NOT DETECTED Final   Metapneumovirus NOT DETECTED NOT DETECTED Final   Rhinovirus / Enterovirus NOT DETECTED NOT DETECTED Final   Influenza A NOT DETECTED NOT DETECTED Final   Influenza B NOT DETECTED NOT DETECTED Final   Parainfluenza Virus 1 NOT DETECTED NOT DETECTED Final   Parainfluenza Virus 2 NOT DETECTED NOT DETECTED Final   Parainfluenza Virus 3 NOT DETECTED NOT DETECTED Final   Parainfluenza Virus 4 NOT DETECTED NOT DETECTED Final   Respiratory Syncytial Virus NOT DETECTED NOT DETECTED Final   Bordetella pertussis NOT DETECTED NOT DETECTED Final   Bordetella Parapertussis NOT DETECTED NOT DETECTED Final  Chlamydophila pneumoniae NOT DETECTED NOT DETECTED Final   Mycoplasma pneumoniae NOT DETECTED NOT DETECTED Final    Comment: Performed at [facility], [address] [place], [place] [address]  Urine Culture (for pregnant, neutropenic or urologic patients or patients with an indwelling urinary catheter)     Status: None   Collection Time: 09/03/24 11:55 AM   Specimen: Urine, Clean Catch  Result Value Ref Range Status   Specimen Description   Final    URINE, CLEAN CATCH Performed at [facility], [address] [place], [place] [address]    Special Requests   Final    NONE Performed at [facility], [address], [place], [place] [address]    Culture   Final    NO GROWTH Performed at [facility], [address], [place], [place] [address]    Report Status 09/04/2024 FINAL  Final  Culture, blood (single) w Reflex to ID Panel     Status: None   Collection Time: 09/03/24  7:46 PM   Specimen: BLOOD  Result Value Ref Range Status   Specimen Description BLOOD BLOOD RIGHT HAND  Final   Special Requests   Final    BOTTLES DRAWN  AEROBIC AND ANAEROBIC Blood Culture adequate volume   Culture   Final    NO GROWTH 5 DAYS Performed at [facility], [address] [place] [place], [place] [address]    Report Status 09/08/2024 FINAL  Final  Blood culture (single)     Status: None (Preliminary result)   Collection Time: 09/07/24 12:23 AM   Specimen: BLOOD LEFT ARM  Result Value Ref Range Status   Specimen Description BLOOD LEFT ARM  Final   Special Requests   Final    BOTTLES DRAWN AEROBIC AND ANAEROBIC Blood Culture results may not be optimal due to an inadequate volume of blood received in culture bottles   Culture   Final    NO GROWTH 2 DAYS Performed at [facility], [address], [place], [place] [address]    Report Status PENDING  Incomplete  Blood Culture (routine x 2)     Status: None (Preliminary result)   Collection Time: 09/07/24  3:45 AM   Specimen: BLOOD  Result Value Ref Range Status   Specimen Description BLOOD BLOOD RIGHT FOREARM  Final   Special Requests   Final    BOTTLES DRAWN AEROBIC AND ANAEROBIC Blood Culture results may not be optimal due to an inadequate volume of blood received in culture bottles   Culture   Final    NO GROWTH 2 DAYS Performed at [facility], [address] [place], [place] [address]    Report Status PENDING  Incomplete  Blood Culture (routine x 2)     Status: None (Preliminary result)   Collection Time: 09/07/24  3:55 AM   Specimen: BLOOD  Result Value Ref Range Status   Specimen Description BLOOD RIGHT ANTECUBITAL  Final   Special Requests   Final    BOTTLES DRAWN AEROBIC AND ANAEROBIC Blood Culture results may not be optimal due to an inadequate volume of blood received in culture bottles   Culture   Final    NO GROWTH 2 DAYS Performed at [facility], [address] [place], [place] [address]    Report Status PENDING  Incomplete  Respiratory (~20 pathogens) panel by PCR     Status: Abnormal   Collection Time: 09/07/24  4:56  AM   Specimen:  Nasopharyngeal Swab; Respiratory  Result Value Ref Range Status   Adenovirus NOT DETECTED NOT DETECTED Final   Coronavirus 229E NOT DETECTED NOT DETECTED Final    Comment: (NOTE) The Coronavirus on the Respiratory Panel, DOES NOT test for the novel  Coronavirus (2019 nCoV)    Coronavirus HKU1 NOT DETECTED NOT DETECTED Final   Coronavirus NL63 NOT DETECTED NOT DETECTED Final   Coronavirus OC43 NOT DETECTED NOT DETECTED Final   Metapneumovirus DETECTED (A) NOT DETECTED Final   Rhinovirus / Enterovirus NOT DETECTED NOT DETECTED Final   Influenza A NOT DETECTED NOT DETECTED Final   Influenza B NOT DETECTED NOT DETECTED Final   Parainfluenza Virus 1 NOT DETECTED NOT DETECTED Final   Parainfluenza Virus 2 NOT DETECTED NOT DETECTED Final   Parainfluenza Virus 3 NOT DETECTED NOT DETECTED Final   Parainfluenza Virus 4 NOT DETECTED NOT DETECTED Final   Respiratory Syncytial Virus NOT DETECTED NOT DETECTED Final   Bordetella pertussis NOT DETECTED NOT DETECTED Final   Bordetella Parapertussis NOT DETECTED NOT DETECTED Final   Chlamydophila pneumoniae NOT DETECTED NOT DETECTED Final   Mycoplasma pneumoniae NOT DETECTED NOT DETECTED Final    Comment: Performed at [facility], [address], [place], [place] [address]  MRSA Next Gen by PCR, Nasal     Status: None   Collection Time: 09/08/24 12:37 PM   Specimen: Nasal Mucosa; Nasal Swab  Result Value Ref Range Status   MRSA by PCR Next Gen NOT DETECTED NOT DETECTED Final    Comment: (NOTE) The GeneXpert MRSA Assay (FDA approved for NASAL specimens only), is one component of a comprehensive MRSA colonization surveillance program. It is not intended to diagnose MRSA infection nor to guide or monitor treatment for MRSA infections. Test performance is not FDA approved in patients less than 78 years old. Performed at [facility], [address] [place], [place] [address]     Procedures and diagnostic  studies:  No results found.              LOS: 1 day   Naphtali Zywicki  [facility]   Pager on [email or web address]. If 7PM-7AM, please contact night-coverage at [email or web address]     09/09/2024, 1:33 PM

## 2024-09-10 ENCOUNTER — Ambulatory Visit

## 2024-09-10 ENCOUNTER — Inpatient Hospital Stay

## 2024-09-10 ENCOUNTER — Other Ambulatory Visit: Payer: Self-pay

## 2024-09-10 ENCOUNTER — Encounter: Payer: Self-pay | Admitting: Oncology

## 2024-09-10 DIAGNOSIS — J189 Pneumonia, unspecified organism: Secondary | ICD-10-CM | POA: Diagnosis not present

## 2024-09-10 DIAGNOSIS — A419 Sepsis, unspecified organism: Secondary | ICD-10-CM | POA: Diagnosis not present

## 2024-09-10 LAB — RAD ONC ARIA SESSION SUMMARY
Course Elapsed Days: 40
Plan Fractions Treated to Date: 23
Plan Prescribed Dose Per Fraction: 1.8 Gy
Plan Total Fractions Prescribed: 30
Plan Total Prescribed Dose: 54 Gy
Reference Point Dosage Given to Date: 41.4 Gy
Reference Point Session Dosage Given: 1.8 Gy
Session Number: 23

## 2024-09-10 MED ORDER — IPRATROPIUM-ALBUTEROL 0.5-2.5 (3) MG/3ML IN SOLN
3.0000 mL | Freq: Three times a day (TID) | RESPIRATORY_TRACT | Status: DC
  Administered 2024-09-10 – 2024-09-11 (×4): 3 mL via RESPIRATORY_TRACT
  Filled 2024-09-10 (×4): qty 3

## 2024-09-10 MED ORDER — ONDANSETRON HCL 4 MG/2ML IJ SOLN
4.0000 mg | Freq: Four times a day (QID) | INTRAMUSCULAR | Status: DC | PRN
  Administered 2024-09-10 – 2024-09-13 (×2): 4 mg via INTRAVENOUS
  Filled 2024-09-10 (×2): qty 2

## 2024-09-10 MED ORDER — CARMEX CLASSIC LIP BALM EX OINT
1.0000 | TOPICAL_OINTMENT | CUTANEOUS | Status: DC | PRN
  Administered 2024-09-10: 1 via TOPICAL
  Filled 2024-09-10: qty 10

## 2024-09-10 NOTE — Progress Notes (Signed)
 Progress Note    Monica Gonzalez  FMW:[id number] DOB: 19-Mar-1976  DOA: 09/06/2024 PCP: Epifanio Alm SQUIBB, MD      Brief Narrative:    Medical records reviewed and are as summarized below:  Monica Gonzalez is a 48 y.o. 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  She was initially treated with broad-spectrum antibiotics including IV cefepime, vancomycin, Flagyl meropenem.  She was discharged on Augmentin  on 09/06/2024. Unfortunately, she returned to the emergency department later in the evening on 09/06/2024 with complaints of frequent cough, shortness of breath and chest tightness.  Vital signs in the ED: Temperature 99.4 F, respiratory rate 14, pulse 103, BP 108/58, O2 sat 94% on room air.  O2 saturation subsequently dropped to 88% while she was sleeping.  Labs significant for WBC 2.9, hemoglobin 7.3, hematocrit 23.3, platelet 196, potassium 2.9, lactic acid normal (0.9) and procalcitonin 0.64.   Chest x-ray IMPRESSION: 1. New patchy right lower lobe opacity, favoring pneumonia over atelectasis.  CT chest without contrast IMPRESSION: 1. Multifocal patchy right lung opacities, right lower lobe predominant, favoring multifocal pneumonia. 2. Mild left basilar opacity, atelectasis versus pneumonia. 3. Small bilateral pleural effusions.    She was readmitted to the hospital for sepsis secondary to multifocal pneumonia.    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  (Class III obesity)   Sepsis secondary to multifocal pneumonia in an immunocompromised patient, metapneumovirus infection: MRSA screen negative.  IV vancomycin was discontinued on 09/09/2024. Repeat chest x-ray on 09/10/2024 showed diffuse interstitial prominence and bibasilar patchy airspace opacities suspicious for pneumonia.  Small right pleural effusion. Continue IV meropenem through 09/11/2024 (total of 5 days) Hycodan.  As needed for cough. Continue bronchodilators as needed. Respiratory viral panel positive for metapneumovirus.  This could be the cause of her pneumonia.  Possibly secondary bacterial involvement as well   Acute hypoxic respiratory failure: Oxygen therapy weaned down from 4 L to 3 L per [place].  Wean off oxygen as able.   Hypokalemia: Improved   Acute on chronic anemia: Probably related to malignancy and chemoradiation.  H&H stable.  Hemoglobin up from 7.3-8.3-8.5-8.1. No indication for blood transfusion at this time.   Leukopenia: Probably due to sepsis.  WBC up from 1.9-2.3-2.4.   Anal cancer on chemotherapy and radiation therapy, history of right breast cancer s/p lumpectomy: Outpatient follow-up with oncologist.   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  Lomotil as needed for diarrhea   History of PE, DVT right internal jugular and right subclavian vein in September 2025): Continue therapeutic dose Lovenox .   Comorbidities include chronic  pain, anxiety, depression, SLE, history of asthma   Diet Order              Diet regular Room service appropriate? Yes; Fluid consistency: Thin  Diet effective now                                  Consultants: None  Procedures: None    Medications:    benzonatate   200 mg Oral TID   budesonide   0.25 mg Nebulization BID   busPIRone   5 mg Oral BID   Chlorhexidine  Gluconate Cloth  6 each Topical Daily   DULoxetine   30 mg Oral QHS   enoxaparin   90 mg Subcutaneous Q12H   fentaNYL   1 patch Transdermal Q72H   gabapentin   600 mg Oral BID   hydrocortisone cream   Topical BID   ipratropium-albuterol   3 mL Nebulization TID   midodrine  10 mg Oral TID WC   OLANZapine  5 mg Oral QHS   pantoprazole  40 mg Oral Daily   potassium chloride   40 mEq Oral Daily   Continuous Infusions:  meropenem (MERREM) IV 1 g (09/10/24 0453)     Anti-infectives (From admission, onward)    Start     Dose/Rate Route Frequency Ordered Stop   09/08/24 0500  vancomycin (VANCOREADY) IVPB 1250 mg/250 mL  Status:  Discontinued        1,250 mg 166.7 mL/hr over 90 Minutes Intravenous Every 24 hours 09/07/24 0459 09/09/24 1001   09/07/24 1200  meropenem (MERREM) 1 g in sodium chloride  0.9 % 100 mL IVPB        1 g 200 mL/hr over 30 Minutes Intravenous Every 8 hours 09/07/24 0441 09/11/24 2359   09/07/24 0445  vancomycin (VANCOREADY) IVPB 2000 mg/400 mL        2,000 mg 200 mL/hr over 120 Minutes Intravenous  Once 09/07/24 0441 09/07/24 0714   09/07/24 0330  vancomycin (VANCOCIN) IVPB 1000 mg/200 mL premix  Status:  Discontinued        1,000 mg 200 mL/hr over 60 Minutes Intravenous  Once 09/07/24 0318 09/07/24 0440   09/07/24 0330  meropenem (MERREM) 1 g in sodium chloride  0.9 % 100 mL IVPB        1 g 200 mL/hr over 30 Minutes Intravenous  Once 09/07/24 0318 09/07/24 0455              Family Communication/Anticipated D/C date and plan/Code Status   DVT prophylaxis:      Code Status: Prior  Family Communication: Plan discussed with Tammy, aunt, at the  bedside Disposition Plan: Plan to discharge home   Status is: Inpatient Remains inpatient appropriate because: Sepsis from multifocal pneumonia         Subjective:   She complains of cough and general weakness.  She had fever yesterday evening with temperature of 101.2 F.  Breathing is a little better.  No diarrhea.  Objective:    Vitals:   09/09/24 1815 09/09/24 1931 09/09/24 2014 09/10/24 0826  BP:   123/64 (!) 111/49  Pulse:   99 100  Resp:   17 17  Temp: 99.4 F (37.4 C)  99 F (37.2 C) 99 F (37.2 C)  TempSrc: Oral  Oral Oral  SpO2:  97% 94% 93%  Weight:      Height:       No data found.   Intake/Output Summary (Last 24 hours) at  09/10/2024 1113 Last data filed at 09/10/2024 1045 Gross per 24 hour  Intake 480 ml  Output --  Net 480 ml   Filed Weights   09/06/24 2331  Weight: 91.2 kg    Exam:  GEN: NAD SKIN: Warm and dry EYES: No pallor or icterus ENT: MMM CV: RRR PULM: Mild bilateral wheezing at the lung bases ABD: soft, ND, NT, +BS CNS: AAO x 3, non focal EXT: No edema or tenderness         Data Reviewed:   I have personally reviewed following labs and imaging studies:  Labs: Labs show the following:   Basic Metabolic Panel: Recent Labs  Lab 09/06/24 0552 09/07/24 0023 09/07/24 0849 09/07/24 0851 09/08/24 0500 09/09/24 0400  NA 141 138  --  138 137 135  K 2.9* 2.9*  --  3.3* 3.5 4.1  CL 107 102  --  103 103 103  CO2 26 23  --  24 24 25   GLUCOSE 91 86  --  96 93 114*  BUN 13 10  --  9 8 7   CREATININE 0.68 0.79  --  0.75 0.70 0.67  CALCIUM 8.1* 7.6*  --  7.7* 7.8* 7.8*  MG 1.9  --  1.7  --  2.0 2.1  PHOS  --   --   --   --  3.4 2.7   GFR Estimated Creatinine Clearance: 83.8 mL/min (by C-G formula based on SCr of 0.67 mg/dL). Liver Function Tests: Recent Labs  Lab 09/08/24 0500 09/09/24 0400  ALBUMIN 2.5* 2.3*   No results for input(s): LIPASE, AMYLASE in the last 168 hours. No results for input(s):  AMMONIA in the last 168 hours. Coagulation profile No results for input(s): INR, PROTIME in the last 168 hours.  CBC: Recent Labs  Lab 09/04/24 0500 09/04/24 0530 09/05/24 0545 09/06/24 0552 09/07/24 0023 09/07/24 0851 09/08/24 0500 09/09/24 0400  WBC 2.8*   < > 2.1* 3.4* 2.9* 1.9* 2.3* 2.4*  NEUTROABS 2.3  --  1.7  --  2.1  --  1.6*  --   HGB 9.6*   < > 9.0* 8.8* 7.3* 8.3* 8.5* 8.1*  HCT 29.6*   < > 28.5* 27.5* 23.3* 26.3* 27.7* 26.2*  MCV 99.3   < > 101.4* 100.7* 103.1* 101.9* 101.8* 102.3*  PLT 219   < > 188 200 196 182 189 163   < > = values in this interval not displayed.   Cardiac Enzymes: No results for input(s): CKTOTAL, CKMB, CKMBINDEX, TROPONINI in the last 168 hours. BNP (last 3 results) No results for input(s): PROBNP in the last 8760 hours. CBG: No results for input(s): GLUCAP in the last 168 hours. D-Dimer: No results for input(s): DDIMER in the last 72 hours. Hgb A1c: No results for input(s): HGBA1C in the last 72 hours. Lipid Profile: No results for input(s): CHOL, HDL, LDLCALC, TRIG, CHOLHDL, LDLDIRECT in the last 72 hours. Thyroid  function studies: No results for input(s): TSH, T4TOTAL, T3FREE, THYROIDAB in the last 72 hours.  Invalid input(s): FREET3 Anemia work up: No results for input(s): VITAMINB12, FOLATE, FERRITIN, TIBC, IRON, RETICCTPCT in the last 72 hours.  Sepsis Labs: Recent Labs  Lab 09/03/24 2349 09/04/24 0500 09/04/24 0530 09/04/24 1603 09/04/24 1935 09/05/24 0545 09/07/24 0023 09/07/24 0851 09/08/24 0500 09/09/24 0400  PROCALCITON  --  0.15  --   --   --   --  0.64  --   --   --   WBC  --  2.8*   < >  --   --    < > 2.9* 1.9* 2.3* 2.4*  LATICACIDVEN 1.9  --   --  0.8 1.0  --  0.9  --   --   --    < > = values in this interval not displayed.    Microbiology Recent Results (from the past 240 hours)  Urine Culture (for pregnant, neutropenic or urologic patients or  patients with an indwelling urinary catheter)     Status: Abnormal   Collection Time: 08/31/24  2:16 PM   Specimen: Urine, Clean Catch  Result Value Ref Range Status   Specimen Description   Final    URINE, CLEAN CATCH Performed at [facility], [address] [place], [place] [address]    Special Requests   Final    NONE Performed at [facility], [address] [place], [place] [address]    Culture MULTIPLE SPECIES PRESENT, SUGGEST RECOLLECTION (A)  Final   Report Status 09/02/2024 FINAL  Final  Blood Culture (routine x 2)     Status: None   Collection Time: 08/31/24  3:38 PM   Specimen: BLOOD  Result Value Ref Range Status   Specimen Description BLOOD BLOOD RIGHT ARM  Final   Special Requests   Final    BOTTLES DRAWN AEROBIC AND ANAEROBIC Blood Culture adequate volume   Culture   Final    NO GROWTH 5 DAYS Performed at [facility], [address] [place], [place] [address]    Report Status 09/05/2024 FINAL  Final  Blood Culture (routine x 2)     Status: Abnormal   Collection Time: 08/31/24  6:40 PM   Specimen: BLOOD  Result Value Ref Range Status   Specimen Description   Final    BLOOD BLOOD LEFT WRIST Performed at [facility], [address] [place], [place] [address]    Special Requests   Final    BOTTLES DRAWN AEROBIC AND ANAEROBIC Blood Culture adequate volume Performed at [facility], [address] [place], [place] [address]    Culture  Setup Time   Final    GRAM POSITIVE RODS ANAEROBIC BOTTLE ONLY CRITICAL RESULT CALLED TO, READ BACK BY AND VERIFIED WITH: JASON ROBBINS 09/05/24 0500 MW Performed at [facility], [address] [place], [place] [address]    Culture (A)  Final    CUTIBACTERIUM ACNES Standardized susceptibility testing for this organism is not available. Performed at [facility], [address] [place], [place] [address]    Report Status 09/07/2024 FINAL  Final   Respiratory (~20 pathogens) panel by PCR     Status: None   Collection Time: 09/03/24 10:25 AM   Specimen: Nasopharyngeal Swab; Respiratory  Result Value Ref Range Status   Adenovirus NOT DETECTED NOT DETECTED Final   Coronavirus 229E NOT DETECTED NOT DETECTED Final    Comment: (NOTE) The Coronavirus on the Respiratory Panel, DOES NOT test for the novel  Coronavirus (2019 nCoV)    Coronavirus HKU1 NOT DETECTED NOT DETECTED Final   Coronavirus NL63 NOT DETECTED NOT DETECTED Final   Coronavirus OC43 NOT DETECTED NOT DETECTED Final   Metapneumovirus NOT DETECTED NOT DETECTED Final   Rhinovirus / Enterovirus NOT DETECTED NOT DETECTED Final   Influenza A NOT DETECTED NOT DETECTED Final   Influenza B NOT DETECTED NOT DETECTED Final   Parainfluenza Virus 1 NOT DETECTED NOT DETECTED Final   Parainfluenza Virus 2 NOT DETECTED NOT  DETECTED Final   Parainfluenza Virus 3 NOT DETECTED NOT DETECTED Final   Parainfluenza Virus 4 NOT DETECTED NOT DETECTED Final   Respiratory Syncytial Virus NOT DETECTED NOT DETECTED Final   Bordetella pertussis NOT DETECTED NOT DETECTED Final   Bordetella Parapertussis NOT DETECTED NOT DETECTED Final   Chlamydophila pneumoniae NOT DETECTED NOT DETECTED Final   Mycoplasma pneumoniae NOT DETECTED NOT DETECTED Final    Comment: Performed at [facility], [address] [place], [place] [address]  Urine Culture (for pregnant, neutropenic or urologic patients or patients with an indwelling urinary catheter)     Status: None   Collection Time: 09/03/24 11:55 AM   Specimen: Urine, Clean Catch  Result Value Ref Range Status   Specimen Description   Final    URINE, CLEAN CATCH Performed at [facility], [address], [place], [place] [address]    Special Requests   Final    NONE Performed at [facility], [address], [place], [place] [address]    Culture   Final    NO GROWTH Performed at [facility], [address],  [place], [place] [address]    Report Status 09/04/2024 FINAL  Final  Culture, blood (single) w Reflex to ID Panel     Status: None   Collection Time: 09/03/24  7:46 PM   Specimen: BLOOD  Result Value Ref Range Status   Specimen Description BLOOD BLOOD RIGHT HAND  Final   Special Requests   Final    BOTTLES DRAWN AEROBIC AND ANAEROBIC Blood Culture adequate volume   Culture   Final    NO GROWTH 5 DAYS Performed at [facility], [address], [place], [place] [address]    Report Status 09/08/2024 FINAL  Final  Blood culture (single)     Status: None (Preliminary result)   Collection Time: 09/07/24 12:23 AM   Specimen: BLOOD LEFT ARM  Result Value Ref Range Status   Specimen Description BLOOD LEFT ARM  Final   Special Requests   Final    BOTTLES DRAWN AEROBIC AND ANAEROBIC Blood Culture results may not be optimal due to an inadequate volume of blood received in culture bottles   Culture   Final    NO GROWTH 3 DAYS Performed at [facility], [address] [place], [place] [address]    Report Status PENDING  Incomplete  Blood Culture (routine x 2)     Status: None (Preliminary result)   Collection Time: 09/07/24  3:45 AM   Specimen: BLOOD  Result Value Ref Range Status   Specimen Description BLOOD BLOOD RIGHT FOREARM  Final   Special Requests   Final    BOTTLES DRAWN AEROBIC AND ANAEROBIC Blood Culture results may not be optimal due to an inadequate volume of blood received in culture bottles   Culture   Final    NO GROWTH 3 DAYS Performed at [facility], [address] [place], [place] [address]    Report Status PENDING  Incomplete  Blood Culture (routine x 2)     Status: None (Preliminary result)   Collection Time: 09/07/24  3:55 AM   Specimen: BLOOD  Result Value Ref Range Status   Specimen Description BLOOD RIGHT ANTECUBITAL  Final   Special Requests   Final    BOTTLES DRAWN AEROBIC AND ANAEROBIC Blood Culture results may not be optimal due to  an inadequate volume of blood received in culture bottles   Culture  Final    NO GROWTH 3 DAYS Performed at [facility], [address] [place], [place] [address]    Report Status PENDING  Incomplete  Respiratory (~20 pathogens) panel by PCR     Status: Abnormal   Collection Time: 09/07/24  4:56 AM   Specimen: Nasopharyngeal Swab; Respiratory  Result Value Ref Range Status   Adenovirus NOT DETECTED NOT DETECTED Final   Coronavirus 229E NOT DETECTED NOT DETECTED Final    Comment: (NOTE) The Coronavirus on the Respiratory Panel, DOES NOT test for the novel  Coronavirus (2019 nCoV)    Coronavirus HKU1 NOT DETECTED NOT DETECTED Final   Coronavirus NL63 NOT DETECTED NOT DETECTED Final   Coronavirus OC43 NOT DETECTED NOT DETECTED Final   Metapneumovirus DETECTED (A) NOT DETECTED Final   Rhinovirus / Enterovirus NOT DETECTED NOT DETECTED Final   Influenza A NOT DETECTED NOT DETECTED Final   Influenza B NOT DETECTED NOT DETECTED Final   Parainfluenza Virus 1 NOT DETECTED NOT DETECTED Final   Parainfluenza Virus 2 NOT DETECTED NOT DETECTED Final   Parainfluenza Virus 3 NOT DETECTED NOT DETECTED Final   Parainfluenza Virus 4 NOT DETECTED NOT DETECTED Final   Respiratory Syncytial Virus NOT DETECTED NOT DETECTED Final   Bordetella pertussis NOT DETECTED NOT DETECTED Final   Bordetella Parapertussis NOT DETECTED NOT DETECTED Final   Chlamydophila pneumoniae NOT DETECTED NOT DETECTED Final   Mycoplasma pneumoniae NOT DETECTED NOT DETECTED Final    Comment: Performed at [facility], [address], [place], [place] [address]  MRSA Next Gen by PCR, Nasal     Status: None   Collection Time: 09/08/24 12:37 PM   Specimen: Nasal Mucosa; Nasal Swab  Result Value Ref Range Status   MRSA by PCR Next Gen NOT DETECTED NOT DETECTED Final    Comment: (NOTE) The GeneXpert MRSA Assay (FDA approved for NASAL specimens only), is one component of a comprehensive MRSA colonization  surveillance program. It is not intended to diagnose MRSA infection nor to guide or monitor treatment for MRSA infections. Test performance is not FDA approved in patients less than 69 years old. Performed at [facility], [address] [place], [place] [address]     Procedures and diagnostic studies:  DG Chest 2 View Result Date: 09/10/2024 EXAM: 2 VIEW(S) XRAY OF THE CHEST 09/10/2024 08:42:00 AM COMPARISON: 09/06/2024 CLINICAL HISTORY: Recurrent fever, pneumonia FINDINGS: LINES, TUBES AND DEVICES: Left upper extremity PICC in place with tip at cavoatrial junction. LUNGS AND PLEURA: Low lung volumes. Small right pleural effusion. Bibasilar patchy airspace opacities. HEART AND MEDIASTINUM: No acute abnormality of the cardiac and mediastinal silhouettes. BONES AND SOFT TISSUES: No acute osseous abnormality. IMPRESSION: 1. Diffuse interstitial prominence and bibasilar patchy airspace opacities, suspicious for pneumonia. 2. Small right pleural effusion. Electronically signed by: Oneil Devonshire MD 09/10/2024 09:42 AM EDT RP Workstation: [id number]                LOS: 2 days   Doak Mah  [facility]   Pager on [email or web address]. If 7PM-7AM, please contact night-coverage at [email or web address]     09/10/2024, 11:13 AM

## 2024-09-10 NOTE — Plan of Care (Signed)
  Problem: Skin Integrity: Goal: Risk for impaired skin integrity will decrease Outcome: Progressing   Problem: Safety: Goal: Ability to remain free from injury will improve Outcome: Progressing   Problem: Pain Managment: Goal: General experience of comfort will improve and/or be controlled Outcome: Progressing   Problem: Elimination: Goal: Will not experience complications related to bowel motility Outcome: Progressing   Problem: Nutrition: Goal: Adequate nutrition will be maintained Outcome: Progressing

## 2024-09-10 NOTE — Plan of Care (Signed)

## 2024-09-11 ENCOUNTER — Ambulatory Visit

## 2024-09-11 ENCOUNTER — Inpatient Hospital Stay

## 2024-09-11 ENCOUNTER — Other Ambulatory Visit: Payer: Self-pay

## 2024-09-11 DIAGNOSIS — J189 Pneumonia, unspecified organism: Secondary | ICD-10-CM | POA: Diagnosis not present

## 2024-09-11 DIAGNOSIS — A419 Sepsis, unspecified organism: Secondary | ICD-10-CM | POA: Diagnosis not present

## 2024-09-11 LAB — RAD ONC ARIA SESSION SUMMARY
Course Elapsed Days: 41
Plan Fractions Treated to Date: 24
Plan Prescribed Dose Per Fraction: 1.8 Gy
Plan Total Fractions Prescribed: 30
Plan Total Prescribed Dose: 54 Gy
Reference Point Dosage Given to Date: 43.2 Gy
Reference Point Session Dosage Given: 1.8 Gy
Session Number: 24

## 2024-09-11 MED ORDER — IPRATROPIUM-ALBUTEROL 0.5-2.5 (3) MG/3ML IN SOLN
3.0000 mL | Freq: Four times a day (QID) | RESPIRATORY_TRACT | Status: DC
  Administered 2024-09-11 – 2024-09-12 (×2): 3 mL via RESPIRATORY_TRACT
  Filled 2024-09-11 (×2): qty 3

## 2024-09-11 NOTE — Progress Notes (Signed)
 Progress Note    Monica Gonzalez  FMW:[id number] DOB: 12-Jan-1976  DOA: 09/06/2024 PCP: Epifanio Alm SQUIBB, MD      Brief Narrative:    Medical records reviewed and are as summarized below:  Monica Gonzalez is a 48 y.o. 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  She was initially treated with broad-spectrum antibiotics including IV cefepime, vancomycin, Flagyl meropenem.  She was discharged on Augmentin  on 09/06/2024. Unfortunately, she returned to the emergency department later in the evening on 09/06/2024 with complaints of frequent cough, shortness of breath and chest tightness.  Vital signs in the ED: Temperature 99.4 F, respiratory rate 14, pulse 103, BP 108/58, O2 sat 94% on room air.  O2 saturation subsequently dropped to 88% while she was sleeping.  Labs significant for WBC 2.9, hemoglobin 7.3, hematocrit 23.3, platelet 196, potassium 2.9, lactic acid normal (0.9) and procalcitonin 0.64.   Chest x-ray IMPRESSION: 1. New patchy right lower lobe opacity, favoring pneumonia over atelectasis.  CT chest without contrast IMPRESSION: 1. Multifocal patchy right lung opacities, right lower lobe predominant, favoring multifocal pneumonia. 2. Mild left basilar opacity, atelectasis versus pneumonia. 3. Small bilateral pleural effusions.    She was readmitted to the hospital for sepsis secondary to multifocal pneumonia.    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  (Class III obesity)   Sepsis secondary to multifocal pneumonia in an immunocompromised patient, metapneumovirus infection: MRSA screen negative.  IV vancomycin was discontinued on 09/09/2024. Repeat chest x-ray on 09/10/2024 showed diffuse interstitial prominence and bibasilar patchy airspace opacities suspicious for pneumonia.  Small right pleural effusion. Plan to complete 5 days of IV meropenem today. Continue Hycodan as needed for cough Continue bronchodilators as needed. Respiratory viral panel positive for metapneumovirus.  This could be the cause of her pneumonia.  Possibly secondary bacterial involvement as well   Acute hypoxic respiratory failure: She remains on 3 L/min oxygen.  Unable to wean off at this time.  Continue weaning attempts. She was previously on 4 L/min oxygen on admission.     Hypokalemia: Improved   Acute on chronic anemia: Probably related to malignancy and chemoradiation.  H&H stable.  Hemoglobin up from 7.3-8.3-8.5-8.1. No indication for blood transfusion at this time.   Leukopenia: Probably due to sepsis.  WBC up from 1.9-2.3-2.4.   Anal cancer on chemotherapy and radiation therapy, history of right breast cancer s/p lumpectomy: Outpatient follow-up with oncologist.   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  Lomotil as needed for diarrhea   History of PE, DVT right internal jugular and right subclavian vein in  September 2025): Continue therapeutic dose Lovenox .   Comorbidities include chronic pain, anxiety,  depression, SLE, history of asthma   Diet Order             Diet regular Room service appropriate? Yes; Fluid consistency: Thin  Diet effective now                                  Consultants: None  Procedures: None    Medications:    benzonatate   200 mg Oral TID   budesonide   0.25 mg Nebulization BID   busPIRone   5 mg Oral BID   Chlorhexidine  Gluconate Cloth  6 each Topical Daily   DULoxetine   30 mg Oral QHS   enoxaparin   90 mg Subcutaneous Q12H   fentaNYL   1 patch Transdermal Q72H   gabapentin   600 mg Oral BID   hydrocortisone cream   Topical BID   ipratropium-albuterol   3 mL Nebulization TID   midodrine  10 mg Oral TID WC   OLANZapine  5 mg Oral QHS   pantoprazole  40 mg Oral Daily   potassium chloride   40 mEq Oral Daily   Continuous Infusions:  meropenem (MERREM) IV 1 g (09/11/24 0432)     Anti-infectives (From admission, onward)    Start     Dose/Rate Route Frequency Ordered Stop   09/08/24 0500  vancomycin (VANCOREADY) IVPB 1250 mg/250 mL  Status:  Discontinued        1,250 mg 166.7 mL/hr over 90 Minutes Intravenous Every 24 hours 09/07/24 0459 09/09/24 1001   09/07/24 1200  meropenem (MERREM) 1 g in sodium chloride  0.9 % 100 mL IVPB        1 g 200 mL/hr over 30 Minutes Intravenous Every 8 hours 09/07/24 0441 09/11/24 2359   09/07/24 0445  vancomycin (VANCOREADY) IVPB 2000 mg/400 mL        2,000 mg 200 mL/hr over 120 Minutes Intravenous  Once 09/07/24 0441 09/07/24 0714   09/07/24 0330  vancomycin (VANCOCIN) IVPB 1000 mg/200 mL premix  Status:  Discontinued        1,000 mg 200 mL/hr over 60 Minutes Intravenous  Once 09/07/24 0318 09/07/24 0440   09/07/24 0330  meropenem (MERREM) 1 g in sodium chloride  0.9 % 100 mL IVPB        1 g 200 mL/hr over 30 Minutes Intravenous  Once 09/07/24 0318 09/07/24 0455              Family Communication/Anticipated D/C date and plan/Code Status   DVT prophylaxis:      Code Status:  Prior  Family Communication: None Disposition Plan: Plan to discharge home   Status is: Inpatient Remains inpatient appropriate because: Sepsis from multifocal pneumonia         Subjective:   Interval events noted.  No fever overnight.  She complains of cough and shortness of breath with minimal exertion associated with oxygen desaturation.  Jammie, LPN, was at the bedside  Objective:    Vitals:   09/10/24 1951 09/10/24 2006 09/11/24 0453 09/11/24 0724  BP: (!) 110/39  108/61 113/67  Pulse: (!) 102  100 93  Resp: 20  17 19   Temp: 99 F (37.2 C)  99.2 F (37.3 C) 98.5 F (36.9 C)  TempSrc: Oral   Oral  SpO2: (!) 67% 93% 95% 97%  Weight:      Height:       No data  found.   Intake/Output Summary (Last 24 hours) at 09/11/2024 1056 Last data filed at 09/10/2024 1524 Gross per 24 hour  Intake 240 ml  Output --  Net 240 ml   Filed Weights   09/06/24 2331  Weight: 91.2 kg    Exam:  GEN: NAD SKIN: Warm and dry EYES: No pallor or icterus ENT: MMM CV: RRR PULM: Mild rhonchi at the lung bases.  No rales. ABD: soft, ND, NT, +BS CNS: AAO x 3, non focal EXT: No edema or tenderness       Data Reviewed:   I have personally reviewed following labs and imaging studies:  Labs: Labs show the following:   Basic Metabolic Panel: Recent Labs  Lab 09/06/24 0552 09/07/24 0023 09/07/24 0849 09/07/24 0851 09/08/24 0500 09/09/24 0400  NA 141 138  --  138 137 135  K 2.9* 2.9*  --  3.3* 3.5 4.1  CL 107 102  --  103 103 103  CO2 26 23  --  24 24 25   GLUCOSE 91 86  --  96 93 114*  BUN 13 10  --  9 8 7   CREATININE 0.68 0.79  --  0.75 0.70 0.67  CALCIUM 8.1* 7.6*  --  7.7* 7.8* 7.8*  MG 1.9  --  1.7  --  2.0 2.1  PHOS  --   --   --   --  3.4 2.7   GFR Estimated Creatinine Clearance: 83.8 mL/min (by C-G formula based on SCr of 0.67 mg/dL). Liver Function Tests: Recent Labs  Lab 09/08/24 0500 09/09/24 0400  ALBUMIN 2.5* 2.3*   No results for  input(s): LIPASE, AMYLASE in the last 168 hours. No results for input(s): AMMONIA in the last 168 hours. Coagulation profile No results for input(s): INR, PROTIME in the last 168 hours.  CBC: Recent Labs  Lab 09/05/24 0545 09/06/24 0552 09/07/24 0023 09/07/24 0851 09/08/24 0500 09/09/24 0400  WBC 2.1* 3.4* 2.9* 1.9* 2.3* 2.4*  NEUTROABS 1.7  --  2.1  --  1.6*  --   HGB 9.0* 8.8* 7.3* 8.3* 8.5* 8.1*  HCT 28.5* 27.5* 23.3* 26.3* 27.7* 26.2*  MCV 101.4* 100.7* 103.1* 101.9* 101.8* 102.3*  PLT 188 200 196 182 189 163   Cardiac Enzymes: No results for input(s): CKTOTAL, CKMB, CKMBINDEX, TROPONINI in the last 168 hours. BNP (last 3 results) No results for input(s): PROBNP in the last 8760 hours. CBG: No results for input(s): GLUCAP in the last 168 hours. D-Dimer: No results for input(s): DDIMER in the last 72 hours. Hgb A1c: No results for input(s): HGBA1C in the last 72 hours. Lipid Profile: No results for input(s): CHOL, HDL, LDLCALC, TRIG, CHOLHDL, LDLDIRECT in the last 72 hours. Thyroid  function studies: No results for input(s): TSH, T4TOTAL, T3FREE, THYROIDAB in the last 72 hours.  Invalid input(s): FREET3 Anemia work up: No results for input(s): VITAMINB12, FOLATE, FERRITIN, TIBC, IRON, RETICCTPCT in the last 72 hours.  Sepsis Labs: Recent Labs  Lab 09/04/24 1603 09/04/24 1935 09/05/24 0545 09/07/24 0023 09/07/24 0851 09/08/24 0500 09/09/24 0400  PROCALCITON  --   --   --  0.64  --   --   --   WBC  --   --    < > 2.9* 1.9* 2.3* 2.4*  LATICACIDVEN 0.8 1.0  --  0.9  --   --   --    < > = values in this interval not displayed.    Microbiology Recent Results (from the past  240 hours)  Respiratory (~20 pathogens) panel by PCR     Status: None   Collection Time: 09/03/24 10:25 AM   Specimen: Nasopharyngeal Swab; Respiratory  Result Value Ref Range Status   Adenovirus NOT DETECTED NOT DETECTED Final    Coronavirus 229E NOT DETECTED NOT DETECTED Final    Comment: (NOTE) The Coronavirus on the Respiratory Panel, DOES NOT test for the novel  Coronavirus (2019 nCoV)    Coronavirus HKU1 NOT DETECTED NOT DETECTED Final   Coronavirus NL63 NOT DETECTED NOT DETECTED Final   Coronavirus OC43 NOT DETECTED NOT DETECTED Final   Metapneumovirus NOT DETECTED NOT DETECTED Final   Rhinovirus / Enterovirus NOT DETECTED NOT DETECTED Final   Influenza A NOT DETECTED NOT DETECTED Final   Influenza B NOT DETECTED NOT DETECTED Final   Parainfluenza Virus 1 NOT DETECTED NOT DETECTED Final   Parainfluenza Virus 2 NOT DETECTED NOT DETECTED Final   Parainfluenza Virus 3 NOT DETECTED NOT DETECTED Final   Parainfluenza Virus 4 NOT DETECTED NOT DETECTED Final   Respiratory Syncytial Virus NOT DETECTED NOT DETECTED Final   Bordetella pertussis NOT DETECTED NOT DETECTED Final   Bordetella Parapertussis NOT DETECTED NOT DETECTED Final   Chlamydophila pneumoniae NOT DETECTED NOT DETECTED Final   Mycoplasma pneumoniae NOT DETECTED NOT DETECTED Final    Comment: Performed at [facility], [address] [place], [place] [address]  Urine Culture (for pregnant, neutropenic or urologic patients or patients with an indwelling urinary catheter)     Status: None   Collection Time: 09/03/24 11:55 AM   Specimen: Urine, Clean Catch  Result Value Ref Range Status   Specimen Description   Final    URINE, CLEAN CATCH Performed at [facility], [address] [place], [place] [address]    Special Requests   Final    NONE Performed at [facility], [address], [place], [place] [address]    Culture   Final    NO GROWTH Performed at [facility], [address], [place], [place] [address]    Report Status 09/04/2024 FINAL  Final  Culture, blood (single) w Reflex to ID Panel     Status: None   Collection Time: 09/03/24  7:46 PM   Specimen: BLOOD  Result Value Ref Range Status   Specimen  Description BLOOD BLOOD RIGHT HAND  Final   Special Requests   Final    BOTTLES DRAWN AEROBIC AND ANAEROBIC Blood Culture adequate volume   Culture   Final    NO GROWTH 5 DAYS Performed at [facility], [address] [place], [place] [address]    Report Status 09/08/2024 FINAL  Final  Blood culture (single)     Status: None (Preliminary result)   Collection Time: 09/07/24 12:23 AM   Specimen: BLOOD LEFT ARM  Result Value Ref Range Status   Specimen Description BLOOD LEFT ARM  Final   Special Requests   Final    BOTTLES DRAWN AEROBIC AND ANAEROBIC Blood Culture results may not be optimal due to an inadequate volume of blood received in culture bottles   Culture   Final    NO GROWTH 4 DAYS Performed at [facility], [address] [place], [place] [address]    Report Status PENDING  Incomplete  Blood Culture (routine x 2)     Status: None (Preliminary result)   Collection Time: 09/07/24  3:45 AM   Specimen: BLOOD  Result Value Ref Range Status  Specimen Description BLOOD BLOOD RIGHT FOREARM  Final   Special Requests   Final    BOTTLES DRAWN AEROBIC AND ANAEROBIC Blood Culture results may not be optimal due to an inadequate volume of blood received in culture bottles   Culture   Final    NO GROWTH 4 DAYS Performed at [facility], [address], [place], [place] [address]    Report Status PENDING  Incomplete  Blood Culture (routine x 2)     Status: None (Preliminary result)   Collection Time: 09/07/24  3:55 AM   Specimen: BLOOD  Result Value Ref Range Status   Specimen Description BLOOD RIGHT ANTECUBITAL  Final   Special Requests   Final    BOTTLES DRAWN AEROBIC AND ANAEROBIC Blood Culture results may not be optimal due to an inadequate volume of blood received in culture bottles   Culture   Final    NO GROWTH 4 DAYS Performed at [facility], [address] [place], [place] [address]    Report Status PENDING  Incomplete   Respiratory (~20 pathogens) panel by PCR     Status: Abnormal   Collection Time: 09/07/24  4:56 AM   Specimen: Nasopharyngeal Swab; Respiratory  Result Value Ref Range Status   Adenovirus NOT DETECTED NOT DETECTED Final   Coronavirus 229E NOT DETECTED NOT DETECTED Final    Comment: (NOTE) The Coronavirus on the Respiratory Panel, DOES NOT test for the novel  Coronavirus (2019 nCoV)    Coronavirus HKU1 NOT DETECTED NOT DETECTED Final   Coronavirus NL63 NOT DETECTED NOT DETECTED Final   Coronavirus OC43 NOT DETECTED NOT DETECTED Final   Metapneumovirus DETECTED (A) NOT DETECTED Final   Rhinovirus / Enterovirus NOT DETECTED NOT DETECTED Final   Influenza A NOT DETECTED NOT DETECTED Final   Influenza B NOT DETECTED NOT DETECTED Final   Parainfluenza Virus 1 NOT DETECTED NOT DETECTED Final   Parainfluenza Virus 2 NOT DETECTED NOT DETECTED Final   Parainfluenza Virus 3 NOT DETECTED NOT DETECTED Final   Parainfluenza Virus 4 NOT DETECTED NOT DETECTED Final   Respiratory Syncytial Virus NOT DETECTED NOT DETECTED Final   Bordetella pertussis NOT DETECTED NOT DETECTED Final   Bordetella Parapertussis NOT DETECTED NOT DETECTED Final   Chlamydophila pneumoniae NOT DETECTED NOT DETECTED Final   Mycoplasma pneumoniae NOT DETECTED NOT DETECTED Final    Comment: Performed at [facility], [address] [place], [place] [address]  MRSA Next Gen by PCR, Nasal     Status: None   Collection Time: 09/08/24 12:37 PM   Specimen: Nasal Mucosa; Nasal Swab  Result Value Ref Range Status   MRSA by PCR Next Gen NOT DETECTED NOT DETECTED Final    Comment: (NOTE) The GeneXpert MRSA Assay (FDA approved for NASAL specimens only), is one component of a comprehensive MRSA colonization surveillance program. It is not intended to diagnose MRSA infection nor to guide or monitor treatment for MRSA infections. Test performance is not FDA approved in patients less than 75 years old. Performed at [facility], [address] [place], [place] [address]     Procedures and diagnostic studies:  DG Chest 2 View Result Date: 09/10/2024 EXAM: 2 VIEW(S) XRAY OF THE CHEST 09/10/2024 08:42:00 AM COMPARISON: 09/06/2024 CLINICAL HISTORY: Recurrent fever, pneumonia FINDINGS: LINES, TUBES AND DEVICES: Left upper extremity PICC in place with tip at cavoatrial junction. LUNGS AND PLEURA: Low lung volumes. Small right pleural effusion. Bibasilar patchy airspace opacities. HEART AND MEDIASTINUM: No  acute abnormality of the cardiac and mediastinal silhouettes. BONES AND SOFT TISSUES: No acute osseous abnormality. IMPRESSION: 1. Diffuse interstitial prominence and bibasilar patchy airspace opacities, suspicious for pneumonia. 2. Small right pleural effusion. Electronically signed by: Oneil Devonshire MD 09/10/2024 09:42 AM EDT RP Workstation: [id number]                LOS: 3 days   Shoshannah Faubert  [facility]   Pager on [email or web address]. If 7PM-7AM, please contact night-coverage at [email or web address]     09/11/2024, 10:56 AM

## 2024-09-11 NOTE — Progress Notes (Signed)
 Pt informed that Capecitabine 500mg  will be on hold per MD. Pt requested that pills be returned due to cost and that it is a special order that is hard to replace. Pt given back bottle of Capecitabine.

## 2024-09-11 NOTE — Plan of Care (Signed)
  Problem: Skin Integrity: Goal: Risk for impaired skin integrity will decrease Outcome: Progressing   Problem: Safety: Goal: Ability to remain free from injury will improve Outcome: Progressing   Problem: Elimination: Goal: Will not experience complications related to bowel motility Outcome: Progressing   Problem: Activity: Goal: Risk for activity intolerance will decrease Outcome: Progressing   Problem: Nutrition: Goal: Adequate nutrition will be maintained Outcome: Progressing

## 2024-09-12 ENCOUNTER — Inpatient Hospital Stay: Admitting: Oncology

## 2024-09-12 ENCOUNTER — Ambulatory Visit

## 2024-09-12 ENCOUNTER — Inpatient Hospital Stay

## 2024-09-12 ENCOUNTER — Other Ambulatory Visit: Payer: Self-pay

## 2024-09-12 DIAGNOSIS — A419 Sepsis, unspecified organism: Secondary | ICD-10-CM | POA: Diagnosis not present

## 2024-09-12 DIAGNOSIS — J189 Pneumonia, unspecified organism: Secondary | ICD-10-CM | POA: Diagnosis not present

## 2024-09-12 LAB — CBC
HCT: 26.6 % — ABNORMAL LOW (ref 36.0–46.0)
Hemoglobin: 8.1 g/dL — ABNORMAL LOW (ref 12.0–15.0)
MCH: 31.5 pg (ref 26.0–34.0)
MCHC: 30.5 g/dL (ref 30.0–36.0)
MCV: 103.5 fL — ABNORMAL HIGH (ref 80.0–100.0)
Platelets: 222 K/uL (ref 150–400)
RBC: 2.57 MIL/uL — ABNORMAL LOW (ref 3.87–5.11)
RDW: 17.7 % — ABNORMAL HIGH (ref 11.5–15.5)
WBC: 2.6 K/uL — ABNORMAL LOW (ref 4.0–10.5)
nRBC: 0 % (ref 0.0–0.2)

## 2024-09-12 LAB — RAD ONC ARIA SESSION SUMMARY
Course Elapsed Days: 42
Plan Fractions Treated to Date: 25
Plan Prescribed Dose Per Fraction: 1.8 Gy
Plan Total Fractions Prescribed: 30
Plan Total Prescribed Dose: 54 Gy
Reference Point Dosage Given to Date: 45 Gy
Reference Point Session Dosage Given: 1.8 Gy
Session Number: 25

## 2024-09-12 LAB — RENAL FUNCTION PANEL
Albumin: 2.4 g/dL — ABNORMAL LOW (ref 3.5–5.0)
Anion gap: 10 (ref 5–15)
BUN: <5 mg/dL — ABNORMAL LOW (ref 6–20)
CO2: 31 mmol/L (ref 22–32)
Calcium: 8.6 mg/dL — ABNORMAL LOW (ref 8.9–10.3)
Chloride: 101 mmol/L (ref 98–111)
Creatinine, Ser: 0.46 mg/dL (ref 0.44–1.00)
GFR, Estimated: >60 mL/min (ref >60)
Glucose, Bld: 102 mg/dL — ABNORMAL HIGH (ref 70–99)
Phosphorus: 4.1 mg/dL (ref 2.5–4.6)
Potassium: 4.4 mmol/L (ref 3.5–5.1)
Sodium: 142 mmol/L (ref 135–145)

## 2024-09-12 LAB — CULTURE, BLOOD (ROUTINE X 2)
Culture: NO GROWTH
Culture: NO GROWTH

## 2024-09-12 LAB — CULTURE, BLOOD (SINGLE): Culture: NO GROWTH

## 2024-09-12 LAB — MAGNESIUM: Magnesium: 2.3 mg/dL (ref 1.7–2.4)

## 2024-09-12 MED ORDER — IPRATROPIUM-ALBUTEROL 0.5-2.5 (3) MG/3ML IN SOLN
3.0000 mL | Freq: Two times a day (BID) | RESPIRATORY_TRACT | Status: DC
  Administered 2024-09-12 – 2024-09-14 (×4): 3 mL via RESPIRATORY_TRACT
  Filled 2024-09-12 (×4): qty 3

## 2024-09-12 NOTE — Progress Notes (Signed)
 Progress Note    Monica Gonzalez  FMW:[id number] DOB: [birth year]  DOA: 09/06/2024 PCP: Epifanio Alm SQUIBB, MD      Brief Narrative:    Medical records reviewed and are as summarized below:  Monica Gonzalez is a 48 y.o. 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  She was initially treated with broad-spectrum antibiotics including IV cefepime, vancomycin, Flagyl meropenem.  She was discharged on Augmentin  on 09/06/2024. Unfortunately, she returned to the emergency department later in the evening on 09/06/2024 with complaints of frequent cough, shortness of breath and chest tightness.  Vital signs in the ED: Temperature 99.4 F, respiratory rate 14, pulse 103, BP 108/58, O2 sat 94% on room air.  O2 saturation subsequently dropped to 88% while she was sleeping.  Labs significant for WBC 2.9, hemoglobin 7.3, hematocrit 23.3, platelet 196, potassium 2.9, lactic acid normal (0.9) and procalcitonin 0.64.   Chest x-ray IMPRESSION: 1. New patchy right lower lobe opacity, favoring pneumonia over atelectasis.  CT chest without contrast IMPRESSION: 1. Multifocal patchy right lung opacities, right lower lobe predominant, favoring multifocal pneumonia. 2. Mild left basilar opacity, atelectasis versus pneumonia. 3. Small bilateral pleural effusions.    She was readmitted to the hospital for sepsis secondary to multifocal pneumonia.    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  (Class III obesity)   Sepsis secondary to multifocal pneumonia in an immunocompromised patient, metapneumovirus infection: MRSA screen negative.  IV vancomycin was discontinued on 09/09/2024. Repeat chest x-ray on 09/10/2024 showed diffuse interstitial prominence and bibasilar patchy airspace opacities suspicious for pneumonia.  Small right pleural effusion. Completed 5 days of IV meropenem on 09/11/2024. Continue Hycodan as needed for cough Continue bronchodilators as needed. Respiratory viral panel positive for metapneumovirus.  This could be the cause of her pneumonia.  Possibly secondary bacterial involvement as well   Acute hypoxic respiratory failure: Oxygen has been weaned down to 2.5 L/min (from 3 L/min).  Continue attempts to wean off oxygen.   She was previously on 4 L/min oxygen on admission.     Hypokalemia: Improved   Acute on chronic anemia: Probably related to malignancy and chemoradiation.  H&H stable.  Hemoglobin up from 7.3-8.3-8.5-8.1. No indication for blood transfusion at this time.   Leukopenia: Probably due to sepsis.  WBC up from 1.9-2.3-2.4.   Anal cancer on chemotherapy and radiation therapy, history of right breast cancer s/p lumpectomy: Outpatient follow-up with oncologist.   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  Lomotil as needed for diarrhea   History of PE, DVT right internal jugular and right subclavian vein  in September 2025): Continue therapeutic dose Lovenox .   Comorbidities include chronic pain,  anxiety, depression, SLE, history of asthma   Diet Order             Diet regular Room service appropriate? Yes; Fluid consistency: Thin  Diet effective now                                  Consultants: None  Procedures: None    Medications:    benzonatate   200 mg Oral TID   budesonide   0.25 mg Nebulization BID   busPIRone   5 mg Oral BID   Chlorhexidine  Gluconate Cloth  6 each Topical Daily   DULoxetine   30 mg Oral QHS   enoxaparin   90 mg Subcutaneous Q12H   fentaNYL   1 patch Transdermal Q72H   gabapentin   600 mg Oral BID   hydrocortisone cream   Topical BID   ipratropium-albuterol   3 mL Nebulization BID   midodrine  10 mg Oral TID WC   OLANZapine  5 mg Oral QHS   pantoprazole  40 mg Oral Daily   potassium chloride   40 mEq Oral Daily   Continuous Infusions:     Anti-infectives (From admission, onward)    Start     Dose/Rate Route Frequency Ordered Stop   09/08/24 0500  vancomycin (VANCOREADY) IVPB 1250 mg/250 mL  Status:  Discontinued        1,250 mg 166.7 mL/hr over 90 Minutes Intravenous Every 24 hours 09/07/24 0459 09/09/24 1001   09/07/24 1200  meropenem (MERREM) 1 g in sodium chloride  0.9 % 100 mL IVPB        1 g 200 mL/hr over 30 Minutes Intravenous Every 8 hours 09/07/24 0441 09/11/24 2049   09/07/24 0445  vancomycin (VANCOREADY) IVPB 2000 mg/400 mL        2,000 mg 200 mL/hr over 120 Minutes Intravenous  Once 09/07/24 0441 09/07/24 0714   09/07/24 0330  vancomycin (VANCOCIN) IVPB 1000 mg/200 mL premix  Status:  Discontinued        1,000 mg 200 mL/hr over 60 Minutes Intravenous  Once 09/07/24 0318 09/07/24 0440   09/07/24 0330  meropenem (MERREM) 1 g in sodium chloride  0.9 % 100 mL IVPB        1 g 200 mL/hr over 30 Minutes Intravenous  Once 09/07/24 0318 09/07/24 0455              Family Communication/Anticipated D/C date and plan/Code Status   DVT prophylaxis:      Code Status: Prior  Family Communication:  None Disposition Plan: Plan to discharge home   Status is: Inpatient Remains inpatient appropriate because: Sepsis from multifocal pneumonia         Subjective:   Interval events noted.  She still has a cough with pleuritic chest pain and shortness of breath with minimal exertion.  Objective:    Vitals:   09/11/24 1954 09/12/24 0403 09/12/24 0810 09/12/24 1028  BP: 110/70 (!) 86/60  105/71  Pulse: 96 87    Resp:      Temp: 97.7 F (36.5 C) (!) 97.2 F (36.2 C)    TempSrc:      SpO2: 98% 97% 94% 97%  Weight:      Height:       No data found.   Intake/Output Summary (Last 24 hours) at 09/12/2024 1350 Last data filed at 09/12/2024 1350 Gross per 24 hour  Intake 240 ml  Output --  Net 240 ml   Filed Weights   09/06/24 2331  Weight: 91.2 kg    Exam:  GEN: NAD SKIN: Warm and dry EYES: No pallor or icterus ENT: MMM CV: RRR PULM: Air entry adequate bilaterally.  Occasional rhonchi at the lung bases. ABD: soft, ND, NT, +BS CNS: AAO x 3, non focal EXT: No edema or tenderness        Data Reviewed:   I have personally reviewed following labs and imaging studies:  Labs: Labs show the following:   Basic Metabolic Panel: Recent Labs  Lab 09/06/24 0552 09/07/24 0023 09/07/24 0849 09/07/24 0851 09/08/24 0500 09/09/24 0400 09/12/24 0326  NA 141 138  --  138 137 135 142  K 2.9* 2.9*  --  3.3* 3.5 4.1 4.4  CL 107 102  --  103 103 103 101  CO2 26 23  --  24 24 25 31   GLUCOSE 91 86  --  96 93 114* 102*  BUN 13 10  --  9 8 7  <5*  CREATININE 0.68 0.79  --  0.75 0.70 0.67 0.46  CALCIUM 8.1* 7.6*  --  7.7* 7.8* 7.8* 8.6*  MG 1.9  --  1.7  --  2.0 2.1 2.3  PHOS  --   --   --   --  3.4 2.7 4.1   GFR Estimated Creatinine Clearance: 83.8 mL/min (by C-G formula based on SCr of 0.46 mg/dL). Liver Function Tests: Recent Labs  Lab 09/08/24 0500 09/09/24 0400 09/12/24 0326  ALBUMIN 2.5* 2.3* 2.4*   No results for input(s): LIPASE, AMYLASE in  the last 168 hours. No results for input(s): AMMONIA in the last 168 hours. Coagulation profile No results for input(s): INR, PROTIME in the last 168 hours.  CBC: Recent Labs  Lab 09/07/24 0023 09/07/24 0851 09/08/24 0500 09/09/24 0400 09/12/24 0326  WBC 2.9* 1.9* 2.3* 2.4* 2.6*  NEUTROABS 2.1  --  1.6*  --   --   HGB 7.3* 8.3* 8.5* 8.1* 8.1*  HCT 23.3* 26.3* 27.7* 26.2* 26.6*  MCV 103.1* 101.9* 101.8* 102.3* 103.5*  PLT 196 182 189 163 222   Cardiac Enzymes: No results for input(s): CKTOTAL, CKMB, CKMBINDEX, TROPONINI in the last 168 hours. BNP (last 3 results) No results for input(s): PROBNP in the last 8760 hours. CBG: No results for input(s): GLUCAP in the last 168 hours. D-Dimer: No results for input(s): DDIMER in the last 72 hours. Hgb A1c: No results for input(s): HGBA1C in the last 72 hours. Lipid Profile: No results for input(s): CHOL, HDL, LDLCALC, TRIG, CHOLHDL, LDLDIRECT in the last 72 hours. Thyroid  function studies: No results for input(s): TSH, T4TOTAL, T3FREE, THYROIDAB in the last 72 hours.  Invalid input(s): FREET3 Anemia work up: No results for input(s): VITAMINB12, FOLATE, FERRITIN, TIBC, IRON, RETICCTPCT in the last 72 hours.  Sepsis Labs: Recent Labs  Lab 09/07/24 0023 09/07/24 0851 09/08/24 0500 09/09/24 0400 09/12/24 0326  PROCALCITON 0.64  --   --   --   --   WBC 2.9* 1.9* 2.3* 2.4* 2.6*  LATICACIDVEN 0.9  --   --   --   --     Microbiology Recent Results (from the past 240 hours)  Respiratory (~20 pathogens) panel by PCR     Status: None   Collection Time: 09/03/24 10:25 AM   Specimen: Nasopharyngeal Swab; Respiratory  Result Value Ref Range Status   Adenovirus NOT DETECTED NOT DETECTED Final  Coronavirus 229E NOT DETECTED NOT DETECTED Final    Comment: (NOTE) The Coronavirus on the Respiratory Panel, DOES NOT test for the novel  Coronavirus (2019 nCoV)    Coronavirus  HKU1 NOT DETECTED NOT DETECTED Final   Coronavirus NL63 NOT DETECTED NOT DETECTED Final   Coronavirus OC43 NOT DETECTED NOT DETECTED Final   Metapneumovirus NOT DETECTED NOT DETECTED Final   Rhinovirus / Enterovirus NOT DETECTED NOT DETECTED Final   Influenza A NOT DETECTED NOT DETECTED Final   Influenza B NOT DETECTED NOT DETECTED Final   Parainfluenza Virus 1 NOT DETECTED NOT DETECTED Final   Parainfluenza Virus 2 NOT DETECTED NOT DETECTED Final   Parainfluenza Virus 3 NOT DETECTED NOT DETECTED Final   Parainfluenza Virus 4 NOT DETECTED NOT DETECTED Final   Respiratory Syncytial Virus NOT DETECTED NOT DETECTED Final   Bordetella pertussis NOT DETECTED NOT DETECTED Final   Bordetella Parapertussis NOT DETECTED NOT DETECTED Final   Chlamydophila pneumoniae NOT DETECTED NOT DETECTED Final   Mycoplasma pneumoniae NOT DETECTED NOT DETECTED Final    Comment: Performed at [facility], [address], [place], [place] [address]  Urine Culture (for pregnant, neutropenic or urologic patients or patients with an indwelling urinary catheter)     Status: None   Collection Time: 09/03/24 11:55 AM   Specimen: Urine, Clean Catch  Result Value Ref Range Status   Specimen Description   Final    URINE, CLEAN CATCH Performed at [facility], [address] [place], [place] [address]    Special Requests   Final    NONE Performed at [facility], [address], [place], [place] [address]    Culture   Final    NO GROWTH Performed at [facility], [address] [place], [place] [address]    Report Status 09/04/2024 FINAL  Final  Culture, blood (single) w Reflex to ID Panel     Status: None   Collection Time: 09/03/24  7:46 PM   Specimen: BLOOD  Result Value Ref Range Status   Specimen Description BLOOD BLOOD RIGHT HAND  Final   Special Requests   Final    BOTTLES DRAWN AEROBIC AND ANAEROBIC Blood Culture adequate volume   Culture   Final    NO GROWTH 5  DAYS Performed at [facility], [address] [place], [place] [address]    Report Status 09/08/2024 FINAL  Final  Blood culture (single)     Status: None   Collection Time: 09/07/24 12:23 AM   Specimen: BLOOD LEFT ARM  Result Value Ref Range Status   Specimen Description BLOOD LEFT ARM  Final   Special Requests   Final    BOTTLES DRAWN AEROBIC AND ANAEROBIC Blood Culture results may not be optimal due to an inadequate volume of blood received in culture bottles   Culture   Final    NO GROWTH 5 DAYS Performed at [facility], [address] [place], [place] [address]    Report Status 09/12/2024 FINAL  Final  Blood Culture (routine x 2)     Status: None   Collection Time: 09/07/24  3:45 AM   Specimen: BLOOD  Result Value Ref Range Status   Specimen Description BLOOD BLOOD RIGHT FOREARM  Final   Special Requests   Final    BOTTLES DRAWN AEROBIC AND ANAEROBIC Blood Culture results may not be optimal due to an inadequate volume of blood received in culture bottles   Culture  Final    NO GROWTH 5 DAYS Performed at [facility], [address] [place], [place] [address]    Report Status 09/12/2024 FINAL  Final  Blood Culture (routine x 2)     Status: None   Collection Time: 09/07/24  3:55 AM   Specimen: BLOOD  Result Value Ref Range Status   Specimen Description BLOOD RIGHT ANTECUBITAL  Final   Special Requests   Final    BOTTLES DRAWN AEROBIC AND ANAEROBIC Blood Culture results may not be optimal due to an inadequate volume of blood received in culture bottles   Culture   Final    NO GROWTH 5 DAYS Performed at [facility], [address] [place], [place] [address]    Report Status 09/12/2024 FINAL  Final  Respiratory (~20 pathogens) panel by PCR     Status: Abnormal   Collection Time: 09/07/24  4:56 AM   Specimen: Nasopharyngeal Swab; Respiratory  Result Value Ref Range Status   Adenovirus NOT DETECTED NOT DETECTED Final    Coronavirus 229E NOT DETECTED NOT DETECTED Final    Comment: (NOTE) The Coronavirus on the Respiratory Panel, DOES NOT test for the novel  Coronavirus (2019 nCoV)    Coronavirus HKU1 NOT DETECTED NOT DETECTED Final   Coronavirus NL63 NOT DETECTED NOT DETECTED Final   Coronavirus OC43 NOT DETECTED NOT DETECTED Final   Metapneumovirus DETECTED (A) NOT DETECTED Final   Rhinovirus / Enterovirus NOT DETECTED NOT DETECTED Final   Influenza A NOT DETECTED NOT DETECTED Final   Influenza B NOT DETECTED NOT DETECTED Final   Parainfluenza Virus 1 NOT DETECTED NOT DETECTED Final   Parainfluenza Virus 2 NOT DETECTED NOT DETECTED Final   Parainfluenza Virus 3 NOT DETECTED NOT DETECTED Final   Parainfluenza Virus 4 NOT DETECTED NOT DETECTED Final   Respiratory Syncytial Virus NOT DETECTED NOT DETECTED Final   Bordetella pertussis NOT DETECTED NOT DETECTED Final   Bordetella Parapertussis NOT DETECTED NOT DETECTED Final   Chlamydophila pneumoniae NOT DETECTED NOT DETECTED Final   Mycoplasma pneumoniae NOT DETECTED NOT DETECTED Final    Comment: Performed at [facility], [address] [place], [place] [address]  MRSA Next Gen by PCR, Nasal     Status: None   Collection Time: 09/08/24 12:37 PM   Specimen: Nasal Mucosa; Nasal Swab  Result Value Ref Range Status   MRSA by PCR Next Gen NOT DETECTED NOT DETECTED Final    Comment: (NOTE) The GeneXpert MRSA Assay (FDA approved for NASAL specimens only), is one component of a comprehensive MRSA colonization surveillance program. It is not intended to diagnose MRSA infection nor to guide or monitor treatment for MRSA infections. Test performance is not FDA approved in patients less than 37 years old. Performed at [facility], [address] [place], [place] [address]     Procedures and diagnostic studies:  No results found.               LOS: 4 days   Jahayra Mazo  [facility]   Pager on [email or web address]  7PM-7AM, please contact night-coverage at [email or web address]     09/12/2024, 1:50 PM

## 2024-09-12 NOTE — Plan of Care (Signed)

## 2024-09-12 NOTE — Progress Notes (Signed)
 1028 Pt weaned down to 2.5L sat 97%

## 2024-09-13 ENCOUNTER — Ambulatory Visit

## 2024-09-13 ENCOUNTER — Other Ambulatory Visit: Payer: Self-pay

## 2024-09-13 ENCOUNTER — Encounter: Payer: Self-pay | Admitting: Oncology

## 2024-09-13 DIAGNOSIS — D701 Agranulocytosis secondary to cancer chemotherapy: Secondary | ICD-10-CM

## 2024-09-13 DIAGNOSIS — A419 Sepsis, unspecified organism: Secondary | ICD-10-CM | POA: Diagnosis not present

## 2024-09-13 DIAGNOSIS — Z86718 Personal history of other venous thrombosis and embolism: Secondary | ICD-10-CM | POA: Diagnosis not present

## 2024-09-13 DIAGNOSIS — T451X5A Adverse effect of antineoplastic and immunosuppressive drugs, initial encounter: Secondary | ICD-10-CM

## 2024-09-13 DIAGNOSIS — J189 Pneumonia, unspecified organism: Secondary | ICD-10-CM | POA: Diagnosis not present

## 2024-09-13 DIAGNOSIS — J9601 Acute respiratory failure with hypoxia: Secondary | ICD-10-CM | POA: Diagnosis not present

## 2024-09-13 DIAGNOSIS — E66813 Obesity, class 3: Secondary | ICD-10-CM

## 2024-09-13 LAB — RAD ONC ARIA SESSION SUMMARY
Course Elapsed Days: 43
Plan Fractions Treated to Date: 26
Plan Prescribed Dose Per Fraction: 1.8 Gy
Plan Total Fractions Prescribed: 30
Plan Total Prescribed Dose: 54 Gy
Reference Point Dosage Given to Date: 46.8 Gy
Reference Point Session Dosage Given: 1.8 Gy
Session Number: 26

## 2024-09-13 LAB — CBC
HCT: 25.6 % — ABNORMAL LOW (ref 36.0–46.0)
Hemoglobin: 8 g/dL — ABNORMAL LOW (ref 12.0–15.0)
MCH: 32 pg (ref 26.0–34.0)
MCHC: 31.3 g/dL (ref 30.0–36.0)
MCV: 102.4 fL — ABNORMAL HIGH (ref 80.0–100.0)
Platelets: 250 K/uL (ref 150–400)
RBC: 2.5 MIL/uL — ABNORMAL LOW (ref 3.87–5.11)
RDW: 17.6 % — ABNORMAL HIGH (ref 11.5–15.5)
WBC: 2.3 K/uL — ABNORMAL LOW (ref 4.0–10.5)
nRBC: 0 % (ref 0.0–0.2)

## 2024-09-13 NOTE — Progress Notes (Signed)
 SATURATION QUALIFICATIONS: (This note is used to comply with regulatory documentation for home oxygen)  Patient Saturations on Room Air at Rest = 89%  Patient Saturations on Room Air while Ambulating = 80%  Patient Saturations on 3L Liters of oxygen while Ambulating = 92%  Pt ambulated less than 91ft to bathroom on room air O2 dropped to 80% Nurse placed 3L [place] O2 while ambulating 92% at rest approx 95%

## 2024-09-13 NOTE — Progress Notes (Signed)
 [facility]  - [place] [facility]   PATIENT NAME: Monica Gonzalez    MR#:  [id number]  DATE OF BIRTH:  January 21, 1976  SUBJECTIVE:  no family at bedside. Patient had radiation due to her annual cancer. Feels tired. No shortness of breath remains afebrile. Unable to wean her off oxygen. She desaturated to 80% on ambulation. Will set up home oxygen patient is agreeable. She wants to go home tomorrow.    VITALS:  Blood pressure 115/82, pulse (!) 103, temperature 98 F (36.7 C), resp. rate 18, height 4' 11 (1.499 m), weight 91.2 kg, SpO2 98%.  PHYSICAL EXAMINATION:   GENERAL:  48 y.o.-year-old patient with no acute distress. Morbid obesity LUNGS: Normal breath sounds bilaterally, no wheezing CARDIOVASCULAR: S1, S2 normal. No murmur   ABDOMEN: Soft, nontender, nondistended. Bowel sounds present.  EXTREMITIES: No  edema b/l.    NEUROLOGIC: nonfocal  patient is alert and awake SKIN: No obvious rash, lesion, or ulcer.   LABORATORY PANEL:  CBC Recent Labs  Lab 09/13/24 0500  WBC 2.3*  HGB 8.0*  HCT 25.6*  PLT 250    Chemistries  Recent Labs  Lab 09/12/24 0326  NA 142  K 4.4  CL 101  CO2 31  GLUCOSE 102*  BUN <5*  CREATININE 0.46  CALCIUM 8.6*  MG 2.3   Assessment and Plan Monica Gonzalez is a 48 y.o. 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  She was initially treated with broad-spectrum antibiotics including IV cefepime, vancomycin, Flagyl meropenem.  She was discharged on Augmentin  on 09/06/2024. Unfortunately, she returned to the emergency department later in the evening on  09/06/2024 with complaints of frequent cough, shortness of breath and chest tightness.  Chest x-ray IMPRESSION: 1. New patchy right lower lobe opacity, favoring pneumonia over atelectasis.   CT chest without contrast IMPRESSION: 1. Multifocal patchy right lung opacities, right lower lobe predominant, favoring multifocal pneumonia. 2. Mild left basilar opacity, atelectasis versus pneumonia. 3. Small bilateral pleural effusions.  Sepsis secondary to multifocal pneumonia in an immunocompromised patient, metapneumovirus infection: MRSA screen negative.  IV vancomycin was discontinued on 09/09/2024. --Repeat chest x-ray on 09/10/2024 showed diffuse interstitial prominence and bibasilar patchy airspace opacities suspicious for pneumonia.  Small right pleural effusion. -Completed 5 days of IV meropenem on 09/11/2024. -Continue Hycodan as needed for cough --Continue bronchodilators as needed. -Respiratory viral panel positive for metapneumovirus.  This could be the cause of her pneumonia.  Possibly secondary bacterial involvement as well    Acute hypoxic respiratory failure:  H/o Asthma --Oxygen has been weaned down to 2.5 L/min (from 3 L/min).   --given lung damage from PNA unable to wean oxygen. Pt qualifies for home oxygen. -- Will defer to PCP regarding weaning oxygen. Consider pulmonary consultation/referral as outpatient if needed. This was discussed with patient she is agreeable   Hypokalemia: Improved   Acute on chronic anemia: Probably related to malignancy and chemoradiation.  H&H stable.  Hemoglobin up from 7.3-8.3-8.5-8.1. --No indication for blood transfusion at this time.    Leukopenia: Probably due to sepsis and chemo.  WBC up from 1.9-2.3-2.4. 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  Lomotil as needed for diarrhea    History of PE, DVT right internal jugular and right subclavian vein in September 2025): Continue therapeutic dose Lovenox .     Comorbidities include chronic pain, anxiety, depression, SLE, history of asthma    Procedures: Family communication :none Consults :none CODE STATUS: full DVT Prophylaxis :lovenox  Level of care: Telemetry Medical Status is: Inpatient Remains inpatient appropriate because: will set up oxygen. Patient wants to go home tomorrow.    TOTAL TIME TAKING CARE OF THIS PATIENT: 35 minutes.  >50% time spent on counselling and coordination of care  Note: This dictation was prepared with Dragon dictation along with smaller phrase technology. Any transcriptional errors that result from this process are unintentional.  Leita Blanch M.D    [facility]   CC: Primary care physician; Epifanio Alm SQUIBB, MD

## 2024-09-13 NOTE — Plan of Care (Signed)
  Problem: Education: Goal: Knowledge of General Education information will improve Description: Including pain rating scale, medication(s)/side effects and non-pharmacologic comfort measures Outcome: Progressing   Problem: Clinical Measurements: Goal: Will remain free from infection Outcome: Progressing   Problem: Clinical Measurements: Goal: Respiratory complications will improve Outcome: Progressing   Problem: Clinical Measurements: Goal: Cardiovascular complication will be avoided Outcome: Progressing   Problem: Activity: Goal: Risk for activity intolerance will decrease Outcome: Progressing   Problem: Elimination: Goal: Will not experience complications related to bowel motility Outcome: Progressing   Problem: Elimination: Goal: Will not experience complications related to urinary retention Outcome: Progressing   Problem: Pain Managment: Goal: General experience of comfort will improve and/or be controlled Outcome: Progressing

## 2024-09-14 DIAGNOSIS — A419 Sepsis, unspecified organism: Secondary | ICD-10-CM | POA: Diagnosis not present

## 2024-09-14 DIAGNOSIS — J189 Pneumonia, unspecified organism: Secondary | ICD-10-CM | POA: Diagnosis not present

## 2024-09-14 MED ORDER — HYDROCODONE BIT-HOMATROP MBR 5-1.5 MG/5ML PO SOLN: 5.0000 mL | Freq: Four times a day (QID) | ORAL | 0 refills | Status: DC | PRN

## 2024-09-14 NOTE — Discharge Summary (Signed)
 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  She was initially treated with broad-spectrum antibiotics including IV cefepime, vancomycin, Flagyl meropenem.  She was discharged on Augmentin  on 09/06/2024. Unfortunately, she returned to the emergency department later in the evening on 09/06/2024 with complaints of frequent cough, shortness of breath and chest tightness.   Chest x-ray IMPRESSION: 1. New patchy right lower lobe opacity, favoring pneumonia over atelectasis.   CT chest without contrast IMPRESSION: 1. Multifocal patchy right lung opacities, right lower lobe predominant, favoring multifocal pneumonia. 2. Mild left basilar  opacity, atelectasis versus pneumonia. 3. Small bilateral pleural effusions.   Sepsis secondary to multifocal pneumonia in an immunocompromised patient, metapneumovirus infection: MRSA screen negative.  IV vancomycin was discontinued on 09/09/2024. --Repeat chest x-ray on 09/10/2024 showed diffuse interstitial prominence and bibasilar patchy airspace opacities suspicious for pneumonia.  Small right pleural effusion. -Completed 5 days of IV meropenem on 09/11/2024. -Continue Hycodan as needed for cough --Continue bronchodilators as needed. -Respiratory viral panel positive for metapneumovirus.  This could be the cause of her pneumonia.  Possibly secondary bacterial involvement as well    Acute hypoxic respiratory failure:  H/o Asthma --Oxygen has been weaned down to 2.5 L/min (from 3 L/min).   --given lung damage from PNA unable to wean oxygen. Pt qualifies for home oxygen. -- Will defer to PCP regarding weaning oxygen. Consider pulmonary consultation/referral as outpatient if needed. This was discussed with patient she is agreeable   Hypokalemia: Improved   Acute on chronic anemia: Probably related to malignancy and chemoradiation.  H&H stable.  Hemoglobin up from 7.3-8.3-8.5-8.1. --No indication for blood transfusion at this time.    Leukopenia: Probably due to sepsis and chemo.  WBC up from 1.9-2.3-2.4. 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  Lomotil as needed for diarrhea    History of PE, DVT right internal jugular and right subclavian vein in September 2025): Continue therapeutic dose Lovenox .     Comorbidities include chronic pain, anxiety, depression, SLE, history of asthma    overall stable. Agrees with d/c plans   Procedures:  Family communication :none Consults :none CODE  STATUS: full DVT Prophylaxis :lovenox      Pain control - [place]  Controlled Substance Reporting System database was reviewed. and patient was instructed, not to drive, operate heavy machinery, perform activities at heights, swimming or participation in water  activities or provide baby-sitting services while on Pain, Sleep and Anxiety Medications; until their outpatient Physician has advised to do so again. Also recommended to not to take more than prescribed Pain, Sleep and Anxiety Medications.   Disposition: Home Diet recommendation:  Discharge Diet Orders (From admission, onward)     Start     Ordered   09/14/24 0000  Diet - low sodium heart healthy        09/14/24 0902           Cardiac diet DISCHARGE MEDICATION: Allergies as of 09/14/2024       Reactions   Prednisone Shortness Of Breath, Swelling   Patient has taken methylprednisolone  (Depo-Medrol ) and dexamethasone  (Decadron ) without any documented side effects or adverse reactions. She describes that she used to take prednisone due to her bronchial asthma and respiratory problems, but later on she received some prednisone which into locations caused her to have significant swelling and shortness of breath. For that reason, she was told to avoid the use of prednisone.   Silicone Hives, Itching        Medication List     STOP taking these medications    amoxicillin -clavulanate 500-125 MG tablet Commonly known as: Augmentin    ipratropium 17 MCG/ACT inhaler Commonly known as: ATROVENT  HFA       TAKE these medications    albuterol  108 (90 Base) MCG/ACT inhaler Commonly known as: VENTOLIN  HFA Inhale 2 puffs into the lungs every 4 (four) hours as needed for wheezing or shortness of breath.   ascorbic acid 500 MG tablet Commonly known as: VITAMIN C Take 500 mg by mouth daily.   benzonatate  200 MG capsule Commonly known as: TESSALON  Take 1 capsule (200 mg total) by mouth 3 (three) times daily as needed for  cough.   BIOTIN PO Take by mouth daily.   budesonide  0.25 MG/2ML nebulizer solution Commonly known as: Pulmicort  Take 2 mLs (0.25 mg total) by nebulization 2 (two) times daily.   busPIRone  5 MG tablet Commonly known as: BUSPAR  Take 5 mg by mouth 2 (two) times daily.   capecitabine 500 MG tablet Commonly known as: XELODA Take 3 tablets (1,500 mg total) by mouth 2 (two) times daily after a meal. Take within 30 minutes after meals. Take only on days when you have radiation. Start on 08/29/24.   cholecalciferol 25 MCG (1000 UNIT) tablet Commonly known as: VITAMIN D3 Take 1,000 Units by mouth daily.   DULoxetine  30 MG capsule Commonly known as: CYMBALTA  Take 30 mg by mouth at bedtime.   enoxaparin  80 MG/0.8ML injection Commonly known as: Lovenox  Inject 0.8 mLs (80 mg total) into the skin every 12 (twelve) hours.   esomeprazole  40 MG capsule Commonly known as: NexIUM  Take 1 capsule (40 mg total) by mouth 2 (two) times daily before a meal.   fentaNYL  25 MCG/HR Commonly known as: DURAGESIC  Place 1 patch onto the skin every 3 (three) days.   FT Mucus Relief DM 30-600 MG Tb12 Take 1 tablet by mouth 2 (two) times daily as needed for up to 10 days for cough.   gabapentin  300 MG capsule Commonly known as: NEURONTIN  Take 600 mg by mouth 2 (two) times daily.   HYDROcodone  bit-homatropine 5-1.5  MG/5ML syrup Commonly known as: HYCODAN Take 5 mLs by mouth every 6 (six) hours as needed for cough.   hydrocortisone 2.5 % rectal cream Commonly known as: Anusol-HC Place 1 Application rectally 2 (two) times daily.   ipratropium-albuterol  0.5-2.5 (3) MG/3ML Soln Commonly known as: DUONEB Take 3 mLs by nebulization 4 (four) times daily.   lidocaine  5 % Commonly known as: LIDODERM  1 patch daily.   Linzess 145 MCG Caps capsule Generic drug: linaclotide Take 145 mcg by mouth daily before breakfast.   LORazepam 0.5 MG tablet Commonly known as: ATIVAN Take 1 tablet (0.5 mg total)  by mouth every 12 (twelve) hours as needed for anxiety (nausea).   midodrine 10 MG tablet Commonly known as: PROAMATINE Take 1 tablet (10 mg total) by mouth 3 (three) times daily with meals.   montelukast  10 MG tablet Commonly known as: Singulair  Take 1 tablet (10 mg total) by mouth See admin instructions. Take 1 tablet on the day prior to chemotherapy and take 1 tablet daily for 2 days after chemotherapy.   multivitamin with minerals Tabs tablet Take 1 tablet by mouth daily.   nystatin  100000 UNIT/ML suspension Commonly known as: MYCOSTATIN  Take 5 mLs (500,000 Units total) by mouth 4 (four) times daily. Swish and swallow   OLANZapine 5 MG tablet Commonly known as: ZYPREXA Take 1 tablet (5 mg total) by mouth at bedtime.   oxyCODONE  5 MG immediate release tablet Commonly known as: Oxy IR/ROXICODONE  Take 1 tablet (5 mg total) by mouth every 6 (six) hours as needed for severe pain (pain score 7-10) or breakthrough pain.   potassium chloride  SA 20 MEQ tablet Commonly known as: KLOR-CON  M Take 1 tablet (20 mEq total) by mouth daily.   QC TUMERIC COMPLEX PO Take by mouth daily.               Durable Medical Equipment  (From admission, onward)           Start     Ordered   09/13/24 1346  For home use only DME oxygen  Once       Question Answer Comment  Length of Need 6 Months   Mode or (Route) Nasal cannula   Liters per Minute 3   Frequency Continuous (stationary and portable oxygen unit needed)   Oxygen conserving device Yes   Oxygen delivery system: Gas      09/13/24 1345            Follow-up Information     Epifanio Alm SQUIBB, MD. Schedule an appointment as soon as possible for a visit in 1 week(s).   Specialty: Infectious Diseases Contact information: [address] [place] [address] [phone]         Babara Call, MD. Go to.   Specialty: Oncology Why: on your scheduled appt Contact information: [address] [place]  [place] [address] [phone]                Discharge Exam: Fredricka Weights   09/06/24 2331  Weight: 91.2 kg   GENERAL:  48 y.o.-year-old patient with no acute distress. Morbid obesity LUNGS: Normal breath sounds bilaterally, no wheezing CARDIOVASCULAR: S1, S2 normal. No murmur   ABDOMEN: Soft, nontender, nondistended. Bowel sounds present.  EXTREMITIES: No  edema b/l.    NEUROLOGIC: nonfocal  patient is alert and awake SKIN: No obvious rash, lesion, or ulcer.   Condition at discharge: fair  The results of significant diagnostics from this hospitalization (including imaging, microbiology, ancillary  and laboratory) are listed below for reference.   Imaging Studies: DG Chest 2 View Result Date: 09/10/2024 EXAM: 2 VIEW(S) XRAY OF THE CHEST 09/10/2024 08:42:00 AM COMPARISON: 09/06/2024 CLINICAL HISTORY: Recurrent fever, pneumonia FINDINGS: LINES, TUBES AND DEVICES: Left upper extremity PICC in place with tip at cavoatrial junction. LUNGS AND PLEURA: Low lung volumes. Small right pleural effusion. Bibasilar patchy airspace opacities. HEART AND MEDIASTINUM: No acute abnormality of the cardiac and mediastinal silhouettes. BONES AND SOFT TISSUES: No acute osseous abnormality. IMPRESSION: 1. Diffuse interstitial prominence and bibasilar patchy airspace opacities, suspicious for pneumonia. 2. Small right pleural effusion. Electronically signed by: Oneil Devonshire MD 09/10/2024 09:42 AM EDT RP Workstation: [id number]   CT Chest Wo Contrast Result Date: 09/07/2024 EXAM: CT CHEST WITHOUT CONTRAST 09/07/2024 03:02:30 AM TECHNIQUE: CT of the chest was performed without the administration of intravenous contrast. Multiplanar reformatted images are provided for review. Automated exposure control, iterative reconstruction, and/or weight based adjustment of the mA/kV was utilized to reduce the radiation dose to as low as reasonably achievable. COMPARISON: Chest radiograph earlier today. CLINICAL HISTORY:  Respiratory illness, nondiagnostic xray. Pt complaints of shortness of breath and chest tightness. Pt reports she was discharged from the hospital this morning. Pt reports she is not able to carry on a conversations without feeling short of breath. Pt reports she was admitted to hospital due to sepsis. FINDINGS: MEDIASTINUM: Trace pericardial effusion. Left arm PICC terminates in the mid SVC. The central airways are clear. LYMPH NODES: Subcentimeter mediastinal lymph nodes, likely reactive. No hilar or axillary lymphadenopathy. LUNGS AND PLEURA: Multifocal patchy right lung opacities, right lower lobe predominant, favoring multifocal pneumonia. Mild left basilar opacity, atelectasis versus pneumonia. Small bilateral pleural effusions. No pneumothorax. SOFT TISSUES/BONES: No acute abnormality of the bones or soft tissues. UPPER ABDOMEN: Limited images of the upper abdomen demonstrate mild hepatic steatosis. IMPRESSION: 1. Multifocal patchy right lung opacities, right lower lobe predominant, favoring multifocal pneumonia. 2. Mild left basilar opacity, atelectasis versus pneumonia. 3. Small bilateral pleural effusions. Electronically signed by: Pinkie Pebbles MD 09/07/2024 03:05 AM EDT RP Workstation: [id number]   DG Chest 1 View Result Date: 09/07/2024 EXAM: 1 VIEW(S) XRAY OF THE CHEST 09/07/2024 12:01:00 AM COMPARISON: 09/03/2024 CLINICAL HISTORY: 89973 Shortness of breath 10026. Pt presents to ER from home with complaints of shortness of breath and chest tightness. Pt reports she was discharged from the hospital this morning. Pt reports she is not able to carry on a conversations without feeling short of breath. Pt reports she ; was admitted to hospital due to sepsis. Pt also reports she thinks her PICC line is infected. Pt presents to ER from home with complaints of shortness of breath and chest tightness. Pt reports she was discharged from the hospital this morning. Pt reports she is not able to carry on a  conversations without feeling short of breath. Pt reports she ; was admitted to hospital due to sepsis. Pt also reports she thinks her PICC line is infected. FINDINGS: LINES, TUBES AND DEVICES: Stable left arm PICC at the cavoatrial junction. LUNGS AND PLEURA: New patchy right lower lobe opacity, favoring pneumonia over atelectasis. No pulmonary edema. No pleural effusion. No pneumothorax. HEART AND MEDIASTINUM: No acute abnormality of the cardiac and mediastinal silhouettes. BONES AND SOFT TISSUES: No acute osseous abnormality. IMPRESSION: 1. New patchy right lower lobe opacity, favoring pneumonia over atelectasis. Electronically signed by: Pinkie Pebbles MD 09/07/2024 12:05 AM EDT RP Workstation: [id number]   US  Venous Img Lower Unilateral Right (DVT) Result Date:  09/05/2024 CLINICAL DATA:  Right lower extremity pain, edema and warmth. History of prior DVT. EXAM: RIGHT LOWER EXTREMITY VENOUS DOPPLER ULTRASOUND TECHNIQUE: Gray-scale sonography with graded compression, as well as color Doppler and duplex ultrasound were performed to evaluate the lower extremity deep venous systems from the level of the common femoral vein and including the common femoral, femoral, profunda femoral, popliteal and calf veins including the posterior tibial, peroneal and gastrocnemius veins when visible. The superficial great saphenous vein was also interrogated. Spectral Doppler was utilized to evaluate flow at rest and with distal augmentation maneuvers in the common femoral, femoral and popliteal veins. COMPARISON:  None Available. FINDINGS: Contralateral Common Femoral Vein: Respiratory phasicity is normal and symmetric with the symptomatic side. No evidence of thrombus. Normal compressibility. Common Femoral Vein: No evidence of thrombus. Normal compressibility, respiratory phasicity and response to augmentation. Saphenofemoral Junction: No evidence of thrombus. Normal compressibility and flow on color Doppler imaging.  Profunda Femoral Vein: No evidence of thrombus. Normal compressibility and flow on color Doppler imaging. Femoral Vein: No evidence of thrombus. Normal compressibility, respiratory phasicity and response to augmentation. Popliteal Vein: No evidence of thrombus. Normal compressibility, respiratory phasicity and response to augmentation. Calf Veins: No evidence of thrombus. Normal compressibility and flow on color Doppler imaging. Superficial Great Saphenous Vein: No evidence of thrombus. Normal compressibility. Venous Reflux:  None. Other Findings: No evidence of superficial thrombophlebitis or abnormal fluid collection. IMPRESSION: No evidence of right lower extremity deep venous thrombosis. Electronically Signed   By: Marcey Moan M.D.   On: 09/05/2024 07:49   CT ABDOMEN PELVIS W CONTRAST Result Date: 09/03/2024 CLINICAL DATA:  Urinary tract infection, right breast cancer EXAM: CT ABDOMEN AND PELVIS WITH CONTRAST TECHNIQUE: Multidetector CT imaging of the abdomen and pelvis was performed using the standard protocol following bolus administration of intravenous contrast. RADIATION DOSE REDUCTION: This exam was performed according to the departmental dose-optimization program which includes automated exposure control, adjustment of the mA and/or kV according to patient size and/or use of iterative reconstruction technique. CONTRAST:  OMNIPAQUE  IOHEXOL  300 MG/ML  SOLN COMPARISON:  08/17/2024 FINDINGS: Lower chest: Right lower lobe airspace disease consistent with pneumonia. Trace right effusion. No pneumothorax. Skin thickening within the right breast likely post therapeutic in this patient with a known prior history of right breast cancer and lumpectomy. Hepatobiliary: No focal liver abnormality is seen. No gallstones, gallbladder wall thickening, or biliary dilatation. Pancreas: Unremarkable. No pancreatic ductal dilatation or surrounding inflammatory changes. Spleen: Normal in size without focal  abnormality. Adrenals/Urinary Tract: Adrenal glands are unremarkable. Kidneys are normal, without renal calculi, focal lesion, or hydronephrosis. Bladder is unremarkable. Stomach/Bowel: No bowel obstruction or ileus. Moderate stool within the colon consistent with constipation. Normal appendix right lower quadrant. No bowel wall thickening or inflammatory change. Vascular/Lymphatic: No significant vascular findings are present. No enlarged abdominal or pelvic lymph nodes. Reproductive: Uterus and bilateral adnexa are unremarkable. Other: Trace pelvic free fluid, nonspecific. No free intraperitoneal gas. No abdominal wall hernia. Musculoskeletal: Sequela of multiple subcutaneous injections within the anterior abdominal wall. No acute or destructive bony abnormalities. Reconstructed images demonstrate no additional findings. IMPRESSION: 1. Right lower lobe pneumonia. 2. Trace pelvic free fluid, likely physiologic. 3. Moderate retained stool throughout the colon consistent with constipation. 4. Likely post therapeutic skin thickening within the right breast in this patient with a known history of right breast cancer. Electronically Signed   By: Ozell Daring M.D.   On: 09/03/2024 20:39   DG Chest 1 View Result  Date: 09/03/2024 CLINICAL DATA:  Pneumonia. EXAM: DG CHEST 1V COMPARISON:  08/31/2024 FINDINGS: Left-sided PICC line unchanged with tip over the SVC. Lungs are hypoinflated with minimal stable prominence of the central pulmonary vasculature. No acute consolidation or effusion. Cardiomediastinal silhouette and remainder the exam is unchanged. IMPRESSION: Hypoinflation without acute cardiopulmonary disease. Electronically Signed   By: Toribio Agreste M.D.   On: 09/03/2024 08:54   DG Chest Port 1 View Result Date: 08/31/2024 EXAM: 1 VIEW XRAY OF THE CHEST 08/31/2024 07:31:10 PM COMPARISON: 08/13/2024 CLINICAL HISTORY: Questionable sepsis - evaluate for abnormality. Appears to be some bleeding around the site;  patient is concerned that dressing may need to be changed, states site did not appear that way when she was last scene at [facility] on Thursday. Ambulatory to triage, NAD noted at this time. FINDINGS: LINES, TUBES AND DEVICES: Interval removal of a right chest wall port-a-cath. Interval placement of a left PICC with tip overlying the expected region of the distal superior vena cava. LUNGS AND PLEURA: Slightly low lung volumes. No focal pulmonary opacity. No pulmonary edema. No pleural effusion. No pneumothorax. HEART AND MEDIASTINUM: Slightly prominent cardiac silhouette likely due to AP portable technique and low lung volumes . No acute abnormality of the mediastinal silhouette. BONES AND SOFT TISSUES: No acute osseous abnormality. IMPRESSION: 1. Low lung volumes with no definite acute cardiopulmonary process. 2. Apparent cardiomegaly on AP portable; consider repeat PA and lateral chest radiographs for confirmation. Electronically signed by: Morgane Naveau MD 08/31/2024 07:34 PM EDT RP Workstation: [id number]   IR REMOVAL TUN ACCESS W/ PORT W/O FL MOD SED Result Date: 08/28/2024 INDICATION: 48 year old with malpositioned right jugular Port-A-Cath and thrombus in the right internal jugular vein. Patient needs removal of the Port-A-Cath and placement of a PICC line for treatment of anal cancer. EXAM: 1.  PICC LINE PLACEMENT WITH ULTRASOUND AND FLUOROSCOPIC GUIDANCE 2.  REMOVAL OF RIGHT CHEST PORT-A-CATH MEDICATIONS: No sedation ANESTHESIA/SEDATION: No sedation FLUOROSCOPY: Radiation Exposure Index (as provided by the fluoroscopic device): 8 mGy Kerma COMPLICATIONS: None immediate. PROCEDURE: The patient was advised of the possible risks and complications and agreed to undergo the procedure. The patient was then brought to the angiographic suite for the procedure. The left arm was prepped with chlorhexidine , draped in the usual sterile fashion using maximum barrier technique (cap and mask, sterile gown,  sterile gloves, large sterile sheet, hand hygiene and cutaneous antiseptic). Local anesthesia was attained by infiltration with 1% lidocaine . Ultrasound demonstrated patency of the left basilic vein, and this was documented with an image. Under real-time ultrasound guidance, this vein was accessed with a 21 gauge micropuncture needle and image documentation was performed. The needle was exchanged over a guidewire for a peel-away sheath through which a 39 cm 5 French single lumen power injectable PICC was advanced, and positioned with its tip at the lower SVC/right atrial junction. Fluoroscopy during the procedure and fluoro spot radiograph confirms appropriate catheter position. The catheter was flushed, secured to the skin, and covered with a sterile dressing. The right chest was prepped and draped in a sterile fashion. 1% lidocaine  with epinephrine  was utilized for local anesthesia. An incision was made over the previously healed surgical incision. Utilizing blunt dissection, the port catheter and reservoir were removed from the underlying subcutaneous tissue in their entirety. Retention sutures were removed. The pocket was irrigated with a copious amount of sterile normal saline. The subcutaneous tissue was closed with 3-0 Vicryl interrupted subcutaneous stitches. A 4-0 Vicryl running subcuticular stitch was  utilized to approximate the skin. Dermabond was applied. Dressing was placed. Fluoroscopic image was obtained after removal of the Port-A-Cath. IMPRESSION: Successful placement of a left arm PICC line with sonographic and fluoroscopic guidance. The catheter is ready for use. Successful removal of the right chest Port-A-Cath. Electronically Signed   By: Juliene Balder M.D.   On: 08/28/2024 16:45   IR PICC PLACEMENT LEFT >5 YRS INC IMG GUIDE Result Date: 08/28/2024 INDICATION: 48 year old with malpositioned right jugular Port-A-Cath and thrombus in the right internal jugular vein. Patient needs removal of the  Port-A-Cath and placement of a PICC line for treatment of anal cancer. EXAM: 1.  PICC LINE PLACEMENT WITH ULTRASOUND AND FLUOROSCOPIC GUIDANCE 2.  REMOVAL OF RIGHT CHEST PORT-A-CATH MEDICATIONS: No sedation ANESTHESIA/SEDATION: No sedation FLUOROSCOPY: Radiation Exposure Index (as provided by the fluoroscopic device): 8 mGy Kerma COMPLICATIONS: None immediate. PROCEDURE: The patient was advised of the possible risks and complications and agreed to undergo the procedure. The patient was then brought to the angiographic suite for the procedure. The left arm was prepped with chlorhexidine , draped in the usual sterile fashion using maximum barrier technique (cap and mask, sterile gown, sterile gloves, large sterile sheet, hand hygiene and cutaneous antiseptic). Local anesthesia was attained by infiltration with 1% lidocaine . Ultrasound demonstrated patency of the left basilic vein, and this was documented with an image. Under real-time ultrasound guidance, this vein was accessed with a 21 gauge micropuncture needle and image documentation was performed. The needle was exchanged over a guidewire for a peel-away sheath through which a 39 cm 5 French single lumen power injectable PICC was advanced, and positioned with its tip at the lower SVC/right atrial junction. Fluoroscopy during the procedure and fluoro spot radiograph confirms appropriate catheter position. The catheter was flushed, secured to the skin, and covered with a sterile dressing. The right chest was prepped and draped in a sterile fashion. 1% lidocaine  with epinephrine  was utilized for local anesthesia. An incision was made over the previously healed surgical incision. Utilizing blunt dissection, the port catheter and reservoir were removed from the underlying subcutaneous tissue in their entirety. Retention sutures were removed. The pocket was irrigated with a copious amount of sterile normal saline. The subcutaneous tissue was closed with 3-0 Vicryl  interrupted subcutaneous stitches. A 4-0 Vicryl running subcuticular stitch was utilized to approximate the skin. Dermabond was applied. Dressing was placed. Fluoroscopic image was obtained after removal of the Port-A-Cath. IMPRESSION: Successful placement of a left arm PICC line with sonographic and fluoroscopic guidance. The catheter is ready for use. Successful removal of the right chest Port-A-Cath. Electronically Signed   By: Juliene Balder M.D.   On: 08/28/2024 16:45   IR CV Line Injection Result Date: 08/21/2024 INDICATION: 48 year old female with a surgically placed right chest port catheter. Catheter is malfunctioning and the patient presents for port injection. EXAM: IR NON-TUNNELED CENTRAL VENOUS CATH PLC W IMG MEDICATIONS: None ANESTHESIA/SEDATION: None FLUOROSCOPY TIME:  Radiation exposure index: 10 mGy, air kerma COMPLICATIONS: None immediate. PROCEDURE: Informed written consent was obtained from the patient after a thorough discussion of the procedural risks, benefits and alternatives. All questions were addressed. Maximal Sterile Barrier Technique was utilized including caps, mask, sterile gowns, sterile gloves, sterile drape, hand hygiene and skin antiseptic. A timeout was performed prior to the initiation of the procedure. Fluoroscopic evaluation demonstrates a right IJ port catheter. The entry site into the internal jugular vein appears high and the external portion of the catheter tubing is redundant in the soft  tissues of the right neck. Contrast was next injected. The tip of the catheter overlies the right innominate vein. No contrast enters the vein at this point, the contrast tracks back along the catheter where it fills retrograde into the internal jugular vein. A large tubular filling defect is present within the right internal jugular vein consistent with thrombus. No contrast material passes into the innominate vein. Contrast material instead fills the anterior jugular vein an other venous  collaterals draining into the left innominate vein. IMPRESSION: 1. Malpositioned right IJ approach port catheter with significant fibrin sheath completely occluding the catheter tip. 2. Occlusive thrombus is present within the right internal jugular vein. Recommend removal of this nonfunctional device and placement of a PICC to allow for continued venous access. After several weeks, right upper extremity duplex venous ultrasound could be performed to assess patency of the right internal jugular vein and, if patent, placement of a new port catheter could be considered at that time. Electronically Signed   By: Wilkie Lent M.D.   On: 08/21/2024 15:26   CT ABDOMEN PELVIS W CONTRAST Result Date: 08/17/2024 EXAM: CT ABDOMEN AND PELVIS WITH CONTRAST 08/17/2024 11:29:22 PM TECHNIQUE: CT of the abdomen and pelvis was performed with the administration of intravenous contrast, 100mL (iohexol  (OMNIPAQUE ) 300 MG/ML solution). Multiplanar reformatted images are provided for review. Automated exposure control, iterative reconstruction, and/or weight-based adjustment of the mA/kV was utilized to reduce the radiation dose to as low as reasonably achievable. COMPARISON: Comparison made to June 05, 2024. CLINICAL HISTORY: N/V/D. Anal cancer. Patient presented to the ED from home via POV for loose stools that are green. Patient is nauseated with some abdominal pain. *tracking code: Bo* FINDINGS: LOWER CHEST: Visualized lung bases are clear. 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 The liver is otherwise unremarkable. GALLBLADDER AND BILE DUCTS: No mention of gallbladder or biliary ductal dilatation. SPLEEN: No acute abnormality. PANCREAS: No acute abnormality. ADRENAL GLANDS: No acute abnormality. KIDNEYS, URETERS AND BLADDER: No stones in the kidneys or ureters. No hydronephrosis. No  perinephric or periureteral stranding. Urinary bladder is unremarkable. GI AND BOWEL: Appendix is normal. The stomach, small bowel, and large bowel are otherwise unremarkable. PERITONEUM AND RETROPERITONEUM: Multiple inflammatory-appearing subcutaneous nodules are seen within the anterior abdominal wall, new since prior examination, likely related to subcutaneous injection. No ascites. No free air. VASCULATURE: Aorta is normal in caliber. LYMPH NODES: Heterogeneously enhancing enlarged left inguinal lymph node is again identified, measuring 2.4 cm in diameter, stable since prior examination. No additional pathologic adenopathy. REPRODUCTIVE ORGANS: No acute abnormality. BONES AND SOFT TISSUES: 3.3 x 5.0 cm complex fluid collection within the right breast, prior partial breast resection. No acute osseous abnormality. IMPRESSION: 1. No acute findings in the abdomen or pelvis. 2. 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 3. 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 No additional pathologic adenopathy or evidence of distal metastatic disease within the abdomen and pelvis. 4. Probable postsurgical changes within the right breast. Electronically signed by: Dorethia Molt MD 08/17/2024 11:43 PM EDT RP Workstation: [id number]    Microbiology: Results for orders placed or performed during the hospital encounter of 09/06/24  Blood culture (single)     Status: None   Collection  Time: 09/07/24 12:23 AM   Specimen: BLOOD LEFT ARM  Result Value Ref Range Status   Specimen Description BLOOD LEFT ARM  Final   Special Requests   Final    BOTTLES DRAWN AEROBIC AND ANAEROBIC Blood Culture results may not be optimal due to an inadequate volume of blood received in culture bottles   Culture   Final     NO GROWTH 5 DAYS Performed at [facility], [address] [place], [place] [address]    Report Status 09/12/2024 FINAL  Final  Blood Culture (routine x 2)     Status: None   Collection Time: 09/07/24  3:45 AM   Specimen: BLOOD  Result Value Ref Range Status   Specimen Description BLOOD BLOOD RIGHT FOREARM  Final   Special Requests   Final    BOTTLES DRAWN AEROBIC AND ANAEROBIC Blood Culture results may not be optimal due to an inadequate volume of blood received in culture bottles   Culture   Final    NO GROWTH 5 DAYS Performed at [facility], [address] [place], [place] [address]    Report Status 09/12/2024 FINAL  Final  Blood Culture (routine x 2)     Status: None   Collection Time: 09/07/24  3:55 AM   Specimen: BLOOD  Result Value Ref Range Status   Specimen Description BLOOD RIGHT ANTECUBITAL  Final   Special Requests   Final    BOTTLES DRAWN AEROBIC AND ANAEROBIC Blood Culture results may not be optimal due to an inadequate volume of blood received in culture bottles   Culture   Final    NO GROWTH 5 DAYS Performed at [facility], [address] [place], [place] [address]    Report Status 09/12/2024 FINAL  Final  Respiratory (~20 pathogens) panel by PCR     Status: Abnormal   Collection Time: 09/07/24  4:56 AM   Specimen: Nasopharyngeal Swab; Respiratory  Result Value Ref Range Status   Adenovirus NOT DETECTED NOT DETECTED Final   Coronavirus 229E NOT DETECTED NOT DETECTED Final    Comment: (NOTE) The Coronavirus on the Respiratory Panel, DOES NOT test for the novel  Coronavirus (2019 nCoV)    Coronavirus HKU1 NOT DETECTED NOT DETECTED Final   Coronavirus NL63 NOT DETECTED NOT DETECTED Final   Coronavirus OC43 NOT DETECTED NOT DETECTED Final   Metapneumovirus DETECTED (A) NOT DETECTED Final   Rhinovirus / Enterovirus NOT DETECTED NOT DETECTED Final   Influenza A NOT DETECTED NOT DETECTED Final   Influenza B NOT DETECTED NOT  DETECTED Final   Parainfluenza Virus 1 NOT DETECTED NOT DETECTED Final   Parainfluenza Virus 2 NOT DETECTED NOT DETECTED Final   Parainfluenza Virus 3 NOT DETECTED NOT DETECTED Final   Parainfluenza Virus 4 NOT DETECTED NOT DETECTED Final   Respiratory Syncytial Virus NOT DETECTED NOT DETECTED Final   Bordetella pertussis NOT DETECTED NOT DETECTED Final   Bordetella Parapertussis NOT DETECTED NOT DETECTED Final   Chlamydophila pneumoniae NOT DETECTED NOT DETECTED Final   Mycoplasma pneumoniae NOT DETECTED NOT DETECTED Final    Comment: Performed at [facility], [address], [place], [place] [address]  MRSA Next Gen by PCR, Nasal     Status: None   Collection Time: 09/08/24 12:37 PM   Specimen: Nasal Mucosa; Nasal Swab  Result Value Ref Range Status   MRSA by PCR Next Gen NOT DETECTED NOT DETECTED Final    Comment: (NOTE) The GeneXpert MRSA Assay (FDA  approved for NASAL specimens only), is one component of a comprehensive MRSA colonization surveillance program. It is not intended to diagnose MRSA infection nor to guide or monitor treatment for MRSA infections. Test performance is not FDA approved in patients less than 64 years old. Performed at [facility], [address] [place], [place] [address]     Labs: CBC: Recent Labs  Lab 09/08/24 0500 09/09/24 0400 09/12/24 0326 09/13/24 0500  WBC 2.3* 2.4* 2.6* 2.3*  NEUTROABS 1.6*  --   --   --   HGB 8.5* 8.1* 8.1* 8.0*  HCT 27.7* 26.2* 26.6* 25.6*  MCV 101.8* 102.3* 103.5* 102.4*  PLT 189 163 222 250   Basic Metabolic Panel: Recent Labs  Lab 09/08/24 0500 09/09/24 0400 09/12/24 0326  NA 137 135 142  K 3.5 4.1 4.4  CL 103 103 101  CO2 24 25 31   GLUCOSE 93 114* 102*  BUN 8 7 <5*  CREATININE 0.70 0.67 0.46  CALCIUM 7.8* 7.8* 8.6*  MG 2.0 2.1 2.3  PHOS 3.4 2.7 4.1   Liver Function Tests: Recent Labs  Lab 09/08/24 0500 09/09/24 0400 09/12/24 0326  ALBUMIN 2.5* 2.3* 2.4*   Discharge time  spent: greater than 30 minutes.  Signed: Leita Blanch, MD [facility] 09/14/2024

## 2024-09-14 NOTE — Discharge Instructions (Signed)
 PICC line care as per instructions given to you before Use oxygen as instructed

## 2024-09-16 ENCOUNTER — Ambulatory Visit

## 2024-09-16 ENCOUNTER — Telehealth: Payer: Self-pay | Admitting: Oncology

## 2024-09-16 ENCOUNTER — Telehealth: Payer: Self-pay

## 2024-09-16 ENCOUNTER — Other Ambulatory Visit: Payer: Self-pay

## 2024-09-16 NOTE — Telephone Encounter (Signed)
 Open in error

## 2024-09-16 NOTE — Telephone Encounter (Signed)
 Per secure chat from RN, add pt today or tomorrow, MD only.   I added appt tomorrow 11/4 before her rad appt.  I called and left vm for pt with appt details.

## 2024-09-17 ENCOUNTER — Ambulatory Visit
Admission: RE | Admit: 2024-09-17 | Discharge: 2024-09-17 | Disposition: A | Discharge status: Home / Self Care | Source: Ambulatory Visit | Attending: Radiation Oncology | Admitting: Radiation Oncology

## 2024-09-17 ENCOUNTER — Ambulatory Visit

## 2024-09-17 ENCOUNTER — Other Ambulatory Visit: Payer: Self-pay

## 2024-09-17 ENCOUNTER — Inpatient Hospital Stay (HOSPITAL_BASED_OUTPATIENT_CLINIC_OR_DEPARTMENT_OTHER): Admitting: Oncology

## 2024-09-17 ENCOUNTER — Inpatient Hospital Stay

## 2024-09-17 ENCOUNTER — Encounter: Payer: Self-pay | Admitting: Oncology

## 2024-09-17 VITALS — BP 105/70 | HR 102 | Temp 97.2°F | Resp 18 | Wt 195.0 lb

## 2024-09-17 DIAGNOSIS — K219 Gastro-esophageal reflux disease without esophagitis: Secondary | ICD-10-CM | POA: Insufficient documentation

## 2024-09-17 DIAGNOSIS — D1803 Hemangioma of intra-abdominal structures: Secondary | ICD-10-CM | POA: Insufficient documentation

## 2024-09-17 DIAGNOSIS — Z923 Personal history of irradiation: Secondary | ICD-10-CM | POA: Insufficient documentation

## 2024-09-17 DIAGNOSIS — C50919 Malignant neoplasm of unspecified site of unspecified female breast: Secondary | ICD-10-CM | POA: Diagnosis not present

## 2024-09-17 DIAGNOSIS — Z800 Family history of malignant neoplasm of digestive organs: Secondary | ICD-10-CM | POA: Insufficient documentation

## 2024-09-17 DIAGNOSIS — Z79899 Other long term (current) drug therapy: Secondary | ICD-10-CM | POA: Insufficient documentation

## 2024-09-17 DIAGNOSIS — J4520 Mild intermittent asthma, uncomplicated: Secondary | ICD-10-CM | POA: Diagnosis not present

## 2024-09-17 DIAGNOSIS — K521 Toxic gastroenteritis and colitis: Secondary | ICD-10-CM | POA: Insufficient documentation

## 2024-09-17 DIAGNOSIS — Z809 Family history of malignant neoplasm, unspecified: Secondary | ICD-10-CM | POA: Insufficient documentation

## 2024-09-17 DIAGNOSIS — Z171 Estrogen receptor negative status [ER-]: Secondary | ICD-10-CM | POA: Insufficient documentation

## 2024-09-17 DIAGNOSIS — E876 Hypokalemia: Secondary | ICD-10-CM | POA: Insufficient documentation

## 2024-09-17 DIAGNOSIS — K627 Radiation proctitis: Secondary | ICD-10-CM | POA: Insufficient documentation

## 2024-09-17 DIAGNOSIS — Z801 Family history of malignant neoplasm of trachea, bronchus and lung: Secondary | ICD-10-CM | POA: Insufficient documentation

## 2024-09-17 DIAGNOSIS — M797 Fibromyalgia: Secondary | ICD-10-CM | POA: Insufficient documentation

## 2024-09-17 DIAGNOSIS — R011 Cardiac murmur, unspecified: Secondary | ICD-10-CM | POA: Insufficient documentation

## 2024-09-17 DIAGNOSIS — I2693 Single subsegmental pulmonary embolism without acute cor pulmonale: Secondary | ICD-10-CM | POA: Insufficient documentation

## 2024-09-17 DIAGNOSIS — Z1732 Human epidermal growth factor receptor 2 negative status: Secondary | ICD-10-CM | POA: Insufficient documentation

## 2024-09-17 DIAGNOSIS — D0511 Intraductal carcinoma in situ of right breast: Secondary | ICD-10-CM | POA: Insufficient documentation

## 2024-09-17 DIAGNOSIS — Z7951 Long term (current) use of inhaled steroids: Secondary | ICD-10-CM | POA: Insufficient documentation

## 2024-09-17 DIAGNOSIS — Z853 Personal history of malignant neoplasm of breast: Secondary | ICD-10-CM | POA: Insufficient documentation

## 2024-09-17 DIAGNOSIS — J45909 Unspecified asthma, uncomplicated: Secondary | ICD-10-CM | POA: Insufficient documentation

## 2024-09-17 DIAGNOSIS — N179 Acute kidney failure, unspecified: Secondary | ICD-10-CM | POA: Insufficient documentation

## 2024-09-17 DIAGNOSIS — M129 Arthropathy, unspecified: Secondary | ICD-10-CM | POA: Insufficient documentation

## 2024-09-17 DIAGNOSIS — C210 Malignant neoplasm of anus, unspecified: Secondary | ICD-10-CM | POA: Insufficient documentation

## 2024-09-17 DIAGNOSIS — Z86718 Personal history of other venous thrombosis and embolism: Secondary | ICD-10-CM | POA: Insufficient documentation

## 2024-09-17 DIAGNOSIS — C50911 Malignant neoplasm of unspecified site of right female breast: Secondary | ICD-10-CM | POA: Insufficient documentation

## 2024-09-17 DIAGNOSIS — Z87442 Personal history of urinary calculi: Secondary | ICD-10-CM | POA: Insufficient documentation

## 2024-09-17 DIAGNOSIS — J9811 Atelectasis: Secondary | ICD-10-CM | POA: Insufficient documentation

## 2024-09-17 DIAGNOSIS — Z1721 Progesterone receptor positive status: Secondary | ICD-10-CM | POA: Insufficient documentation

## 2024-09-17 DIAGNOSIS — K760 Fatty (change of) liver, not elsewhere classified: Secondary | ICD-10-CM | POA: Insufficient documentation

## 2024-09-17 DIAGNOSIS — C50411 Malignant neoplasm of upper-outer quadrant of right female breast: Secondary | ICD-10-CM | POA: Insufficient documentation

## 2024-09-17 DIAGNOSIS — I82A11 Acute embolism and thrombosis of right axillary vein: Secondary | ICD-10-CM | POA: Insufficient documentation

## 2024-09-17 DIAGNOSIS — Z9221 Personal history of antineoplastic chemotherapy: Secondary | ICD-10-CM | POA: Insufficient documentation

## 2024-09-17 DIAGNOSIS — Z8701 Personal history of pneumonia (recurrent): Secondary | ICD-10-CM | POA: Insufficient documentation

## 2024-09-17 DIAGNOSIS — G894 Chronic pain syndrome: Secondary | ICD-10-CM | POA: Insufficient documentation

## 2024-09-17 DIAGNOSIS — T451X5A Adverse effect of antineoplastic and immunosuppressive drugs, initial encounter: Secondary | ICD-10-CM | POA: Insufficient documentation

## 2024-09-17 DIAGNOSIS — Z87412 Personal history of vulvar dysplasia: Secondary | ICD-10-CM | POA: Insufficient documentation

## 2024-09-17 DIAGNOSIS — R590 Localized enlarged lymph nodes: Secondary | ICD-10-CM | POA: Insufficient documentation

## 2024-09-17 DIAGNOSIS — Z1722 Progesterone receptor negative status: Secondary | ICD-10-CM | POA: Insufficient documentation

## 2024-09-17 DIAGNOSIS — Y842 Radiological procedure and radiotherapy as the cause of abnormal reaction of the patient, or of later complication, without mention of misadventure at the time of the procedure: Secondary | ICD-10-CM | POA: Insufficient documentation

## 2024-09-17 DIAGNOSIS — I82C11 Acute embolism and thrombosis of right internal jugular vein: Secondary | ICD-10-CM | POA: Insufficient documentation

## 2024-09-17 DIAGNOSIS — Z791 Long term (current) use of non-steroidal anti-inflammatories (NSAID): Secondary | ICD-10-CM | POA: Insufficient documentation

## 2024-09-17 DIAGNOSIS — I2699 Other pulmonary embolism without acute cor pulmonale: Secondary | ICD-10-CM | POA: Diagnosis not present

## 2024-09-17 DIAGNOSIS — E860 Dehydration: Secondary | ICD-10-CM | POA: Insufficient documentation

## 2024-09-17 DIAGNOSIS — M329 Systemic lupus erythematosus, unspecified: Secondary | ICD-10-CM | POA: Insufficient documentation

## 2024-09-17 DIAGNOSIS — Z510 Encounter for antineoplastic radiation therapy: Secondary | ICD-10-CM | POA: Insufficient documentation

## 2024-09-17 LAB — CMP (CANCER CENTER ONLY)
ALT: 18 U/L (ref 0–44)
AST: 36 U/L (ref 15–41)
Albumin: 2.7 g/dL — ABNORMAL LOW (ref 3.5–5.0)
Alkaline Phosphatase: 90 U/L (ref 38–126)
Anion gap: 9 (ref 5–15)
BUN: 6 mg/dL (ref 6–20)
CO2: 24 mmol/L (ref 22–32)
Calcium: 8.5 mg/dL — ABNORMAL LOW (ref 8.9–10.3)
Chloride: 103 mmol/L (ref 98–111)
Creatinine: 0.7 mg/dL (ref 0.44–1.00)
GFR, Estimated: >60 mL/min (ref >60)
Glucose, Bld: 94 mg/dL (ref 70–99)
Potassium: 3.7 mmol/L (ref 3.5–5.1)
Sodium: 136 mmol/L (ref 135–145)
Total Bilirubin: 0.8 mg/dL (ref 0.0–1.2)
Total Protein: 6.3 g/dL — ABNORMAL LOW (ref 6.5–8.1)

## 2024-09-17 LAB — CBC WITH DIFFERENTIAL (CANCER CENTER ONLY)
Abs Immature Granulocytes: 0.12 K/uL — ABNORMAL HIGH (ref 0.00–0.07)
Basophils Absolute: 0 K/uL (ref 0.0–0.1)
Basophils Relative: 1 %
Eosinophils Absolute: 0.2 K/uL (ref 0.0–0.5)
Eosinophils Relative: 3 %
HCT: 31.7 % — ABNORMAL LOW (ref 36.0–46.0)
Hemoglobin: 10 g/dL — ABNORMAL LOW (ref 12.0–15.0)
Immature Granulocytes: 3 %
Lymphocytes Relative: 10 %
Lymphs Abs: 0.5 K/uL — ABNORMAL LOW (ref 0.7–4.0)
MCH: 32.3 pg (ref 26.0–34.0)
MCHC: 31.5 g/dL (ref 30.0–36.0)
MCV: 102.3 fL — ABNORMAL HIGH (ref 80.0–100.0)
Monocytes Absolute: 0.7 K/uL (ref 0.1–1.0)
Monocytes Relative: 16 %
Neutro Abs: 3.1 K/uL (ref 1.7–7.7)
Neutrophils Relative %: 67 %
Platelet Count: 285 K/uL (ref 150–400)
RBC: 3.1 MIL/uL — ABNORMAL LOW (ref 3.87–5.11)
RDW: 18.5 % — ABNORMAL HIGH (ref 11.5–15.5)
WBC Count: 4.6 K/uL (ref 4.0–10.5)
nRBC: 0 % (ref 0.0–0.2)

## 2024-09-17 LAB — RAD ONC ARIA SESSION SUMMARY
Course Elapsed Days: 47
Plan Fractions Treated to Date: 27
Plan Prescribed Dose Per Fraction: 1.8 Gy
Plan Total Fractions Prescribed: 30
Plan Total Prescribed Dose: 54 Gy
Reference Point Dosage Given to Date: 48.6 Gy
Reference Point Session Dosage Given: 1.8 Gy
Session Number: 27

## 2024-09-17 LAB — SAMPLE TO BLOOD BANK

## 2024-09-17 MED ORDER — AZELASTINE HCL 0.1 % NA SOLN: 1.0000 | Freq: Two times a day (BID) | NASAL | 0 refills | Status: AC

## 2024-09-17 MED ORDER — PROMETHAZINE HCL 25 MG PO TABS: 25.0000 mg | ORAL_TABLET | Freq: Three times a day (TID) | ORAL | 0 refills | Status: AC | PRN

## 2024-09-17 MED ORDER — DEXAMETHASONE 4 MG PO TABS: ORAL_TABLET | ORAL | 1 refills | Status: AC

## 2024-09-17 MED ORDER — PROCHLORPERAZINE MALEATE 10 MG PO TABS: 10.0000 mg | ORAL_TABLET | Freq: Four times a day (QID) | ORAL | 1 refills | Status: AC | PRN

## 2024-09-17 NOTE — Assessment & Plan Note (Signed)
 Continue inhalers.singular.   Recommend patient to follow-up with pulmonology.

## 2024-09-17 NOTE — Assessment & Plan Note (Addendum)
 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 mT1c pN41mi Initial margin was positive and patient underwent reexcision and achieved a negative margin.  Patient's case is complicated as evidenced by 2 cancer primaries.  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 Plan to have proceed with concurrent chemotherapy and radiation treatment for Stage III anal SCC first.  After that she will need to have adjuvant chemotherapy for triple negative breast cancer.  Plan to start treatment a few weeks after she finishes anal cancer treatments. Plan ddAC followed by weekly Taxol. Obtain MUGA/Echo

## 2024-09-17 NOTE — Assessment & Plan Note (Signed)
 Anusol rectal cream topically

## 2024-09-17 NOTE — Assessment & Plan Note (Signed)
 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 Continue Lovenox  1 mg/kg twice daily-80 mg every 12 hours.  anti Xa level was c in therapeutic/supratherapeutic range.     I will refer patient to establish care for vascular surgeon for evaluation of feasibility of thrombectomy

## 2024-09-17 NOTE — Progress Notes (Signed)
 START ON PATHWAY REGIMEN - Breast     Cycles 1 through 4: A cycle is every 14 days:     Doxorubicin      Cyclophosphamide      Pegfilgrastim-xxxx    Cycles 5 through 16: A cycle is every 7 days:     Paclitaxel   **Always confirm dose/schedule in your pharmacy ordering system**  Patient Characteristics: Postoperative without Neoadjuvant Therapy, M0 (Pathologic Staging), Invasive Disease, Adjuvant Therapy, HER2 Negative, ER Negative, Node Negative, pT1a-c, N71mi or pT1c or Higher, pN0 Therapeutic Status: Postoperative without Neoadjuvant Therapy, M0 (Pathologic Staging) AJCC Grade: G3 AJCC N Category: pN62mi AJCC M Category: cM0 ER Status: Negative (-) AJCC 8 Stage Grouping: IB HER2 Status: Negative (-) Oncotype Dx Recurrence Score: Not Appropriate AJCC T Category: pT1c PR Status: Negative (-) Intent of Therapy: Curative Intent, Discussed with Patient

## 2024-09-17 NOTE — Assessment & Plan Note (Signed)
 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  Per IR, right inguinal lymph node is not enlarged.  Previously on concurrent chemotherapy 5-FU and mitomycin  C with Radiation. S/p D1 5-FU and Mitomycin -C. Patient is on radiation.  Medi port was occluded and has been removed. 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  Patient has PICC line placement.  This is a complicated case.  Continuous 5-FU infusion via PICC line is not considered safe.  She has been on Xeloda 1500 mg twice daily on days of radiation. - held due to recent admission.  Recommend patient to resume Xeloda today. She has resumed radiation, last RT 10/7

## 2024-09-17 NOTE — Assessment & Plan Note (Signed)
 Continue Lovenox 1 mg/kg twice daily

## 2024-09-17 NOTE — Progress Notes (Signed)
 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  Per IR, right inguinal lymph node is not enlarged.  Previously on concurrent chemotherapy 5-FU and mitomycin  C with Radiation. S/p D1 5-FU and Mitomycin -C. Patient is on radiation.  Medi port was occluded and has been removed. 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  Patient has PICC line placement.  This is a complicated case.  Continuous 5-FU infusion via PICC line is not considered safe.  She has been on Xeloda 1500 mg twice daily on days of radiation. - held due to recent admission.  Recommend patient to resume Xeloda today. 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 mT1c pN43mi Initial margin was positive and patient underwent reexcision and achieved a negative margin.  Patient's case is complicated as evidenced by 2 cancer primaries.  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 Plan to have proceed with concurrent chemotherapy and radiation treatment for Stage III anal SCC first.  After that she will need to have adjuvant chemotherapy for triple negative breast cancer.  Plan to start treatment a few weeks after she finishes anal cancer treatments.   Acute subsegmental pulmonary embolism (HCC) Continue Lovenox  1 mg/kg twice daily.  Asthma Continue inhalers.singular.   Recommend patient to follow-up with pulmonology.  Internal jugular vein thrombosis,  right (HCC) acute occlusive DVT involving the right internal jugular and subclavian veins. Continue Lovenox  1 mg/kg twice daily-80 mg every 12 hours.  anti Xa level was c in therapeutic/supratherapeutic range.     I will refer patient to establish care for vascular surgeon for evaluation of feasibility of thrombectomy  Radiation proctitis Anusol rectal cream topically     Orders Placed This Encounter  Procedures   CBC with Differential (Cancer Center Only)    Standing Status:   Future    Number of Occurrences:   1    Expected Date:   09/18/2024    Expiration Date:   12/17/2024   CMP (Cancer Center only)    Standing Status:   Future    Number of Occurrences:   1    Expected Date:   09/18/2024    Expiration Date:   12/17/2024   Sample to Blood Bank    Standing Status:   Future    Number of Occurrences:   1    Expected Date:   09/18/2024    Expiration Date:   12/17/2024   Follow-up 1 week  lab NP IV fluid 2 weeks lab MD All questions were answered. The patient knows to call the clinic with any problems, questions or concerns.  [name], MD, PhD [facility] Hematology Oncology 09/17/2024    HISTORY OF PRESENTING ILLNESS:  Monica Gonzalez 48 y.o. 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 Summary of oncologic history is as follows: Oncology History  Anal squamous cell carcinoma (  HCC)  05/29/2024 Initial Diagnosis   Anal squamous cell carcinoma   Patient has noticed a hard mass at the anal area which she initially thought was a hemorrhoid.  The mass has been present for more than a month and appears to be enlarging. Patient was referred to establish care with surgeon Dr. Tye  05/16/2024, incisional biopsy of anal mass showed 1. Anus, biopsy, mass :       - INVASIVE MODERATELY DIFFERENTIATED SQUAMOUS  CELL CARCINOMA  06/19/2024, left inguinal adenopathy biopsy showed Metastatic moderately differentiated squamous cell carcinoma.  '    05/29/2024 Cancer Staging   Staging form: Anus, AJCC V9 - Clinical stage from 05/29/2024: Stage IIIA (cT3, cN1a, cM0) - Signed by Babara Call, MD on 06/26/2024 Stage prefix: Initial diagnosis   06/05/2024 Imaging   CT chest abdomen pelvis with contrast  Slight thickening in the area of the anal canal. Please correlate for history of neoplasm. There is a pathologic enlarged left inguinal node. There is a borderline right-sided node.   No additional areas of abnormal nodal enlargement or other aggressive appearing mass lesion at this time.   Fatty liver infiltration with known segment 7 hepatic hemangioma.   Of note there is a right-sided upper breast mass which was not clearly seen on prior CT scan. Please correlate for any prior study or if needed diagnostic mammographic evaluation and possible ultrasound when appropriate.     06/12/2024 Imaging   CT chest abdomen pelvis with contrast showed Slight thickening in the area of the anal canal. Please correlate for history of neoplasm. There is a pathologic enlarged left inguinal node. There is a borderline right-sided node.   No additional areas of abnormal nodal enlargement or other aggressive appearing mass lesion at this time.   Fatty liver infiltration with known segment 7 hepatic hemangioma.   Of note there is a right-sided upper breast mass which was not clearly seen on prior CT scan. Please correlate for any prior study or if needed diagnostic mammographic evaluation and possible ultrasound when appropriate.   06/19/2024 Procedure   1. Lymph node, biopsy, left inguinal adenopathy :       -  METASTATIC MODERATELY DIFFERENTIATED SQUAMOUS CELL CARCINOMA    07/01/2024 Imaging   PET scan showed 1. Hypermetabolic anal mass with hypermetabolic left inguinal adenopathy, compatible with anorectal  carcinoma. 2. Hypermetabolic right breast nodule with recent biopsies on 06/18/2024. Please correlate with pathology report.   07/31/2024 - 07/31/2024 Chemotherapy   Patient is on Treatment Plan : ANUS Mitomycin  D1,28 + 5FU D1-4, 28-31 q32d     08/23/2024 Genetic Testing   Negative genetic testing. No pathogenic variants identified on the [facility] CancerNext-Expanded+RNA Panel. The report date is 08/23/2024.  The CancerNext-Expanded gene panel offered by [facility] and includes sequencing, rearrangement, and RNA analysis for the following 77 genes: AIP, ALK, APC, ATM, AXIN2, BAP1, BARD1, BMPR1A, BRCA1, BRCA2, BRIP1, CDC73, CDH1, CDK4, CDKN1B, CDKN2A, CEBPA, CHEK2, CTNNA1, DDX41, DICER1, ETV6, FH, FLCN, GATA2, LZTR1, MAX, MBD4, MEN1, MET, MLH1, MSH2, MSH3, MSH6, MUTYH, NF1, NF2, NTHL1, PALB2, PHOX2B, PMS2, POT1, PRKAR1A, PTCH1, PTEN, RAD51C, RAD51D, RB1, RET, RPS20, RUNX1, SDHA, SDHAF2, SDHB, SDHC, SDHD, SMAD4, SMARCA4, SMARCB1, SMARCE1, STK11, SUFU, TMEM127, TP53, TSC1, TSC2, VHL, and WT1 (sequencing and deletion/duplication); EGFR, HOXB13, KIT, MITF, PDGFRA, POLD1, and POLE (sequencing only); EPCAM and GREM1 (deletion/duplication only).    08/29/2024 - 08/29/2024 Chemotherapy   Patient is on Treatment Plan : ANUS Mitomycin  D1,29 + Capecitabine + XRT  Invasive carcinoma of breast (HCC)  06/12/2024 Imaging   CT chest abdomen pelvis with contrast showed Slight thickening in the area of the anal canal. Please correlate for history of neoplasm. There is a pathologic enlarged left inguinal node. There is a borderline right-sided node.   No additional areas of abnormal nodal enlargement or other aggressive appearing mass lesion at this time.   Fatty liver infiltration with known segment 7 hepatic hemangioma.   Of note there is a right-sided upper breast mass which was not clearly seen on prior CT scan. Please correlate for any prior study or if needed diagnostic mammographic evaluation and  possible ultrasound when appropriate.   06/17/2024 Mammogram   1. There is a suspicious 17 mm mass at the site of CT and mammographic concern in the RIGHT breast. It demonstrates associated pleomorphic calcifications. Recommend ultrasound-guided biopsy for definitive characterization. 2. There is incidental sonographic note of a 7 mm mass in the RIGHT breast at 12 o'clock and a 6 mm non mass area at 9 o'clock. Recommend ultrasound-guided biopsy of these 2 areas for definitive characterization given suspicious appearance of the dominant mass. 3. No suspicious RIGHT axillary adenopathy. 4. No mammographic evidence of malignancy in the LEFT breast.   06/18/2024 Cancer Staging   Staging form: Breast, AJCC 8th Edition - Clinical stage from 06/18/2024: Stage IB (cT1b, cN0, cM0, G3, ER-, PR-, HER2-) - Signed by Babara Call, MD on 06/26/2024 Stage prefix: Initial diagnosis Histologic grading system: 3 grade system   06/18/2024 Initial Diagnosis   Invasive carcinoma of breast [facility])  Patient with right breast biopsy.  Pathology showed 1. Breast, right, needle core biopsy, 12:00 12cmfn (heart clip) :      INVASIVE DUCTAL CARCINOMA      TUBULE FORMATION: SCORE 3      NUCLEAR PLEOMORPHISM: SCORE 3      MITOTIC COUNT: SCORE 2      TOTAL SCORE: 8      OVERALL GRADE: 3      LYMPHOVASCULAR INVASION: NOT IDENTIFIED      CANCER LENGTH: 1.0 CM      CALCIFICATIONS: NOT IDENTIFIED      OTHER FINDINGS: NONE      ER negative, PR negative, HER2 negative (IHC 0) Ki-67 95%.       2. Breast, right, needle core biopsy, 12:00 8cmfn (coil clip) :      INVASIVE DUCTAL CARCINOMA      DUCTAL CARCINOMA IN SITU, SOLID, HIGH NUCLEAR GRADE WITH NECROSIS      TUBULE FORMATION: SCORE 3      NUCLEAR PLEOMORPHISM: SCORE 3      MITOTIC COUNT: SCORE 2      TOTAL SCORE: 8      OVERALL GRADE: 3      LYMPHOVASCULAR INVASION: NOT IDENTIFIED      CANCER LENGTH: 0.3 CM      CALCIFICATIONS: NOT IDENTIFIED      OTHER FINDINGS:  NONE            3. Breast, right, needle core biopsy, 9:00 12cmfn (venus) :      BENIGN BREAST TISSUE WITH DENSE STROMAL FIBROSIS.      NEGATIVE FOR ATYPIA OR MALIGNANCY.    07/08/2024 Surgery   Patient went right breast lumpectomy and sentinel lymph node biopsy.  1. 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      - DUCTAL CARCINOMA IN SITU (DCIS).      -  SEE CANCER SUMMARY AND NOTE BELOW.      - TWO BIOPSY SITES WITH CORRESPONDING HEART AND COIL CLIPS.      - TWO SAVI SCOUT TAGS.       2. Lymph node, sentinel, biopsy, Right :      - MICROMETASTATIC CARCINOMA INVOLVES ONE OF FIVE LYMPH NODES (1MI/5).      - SEE CANCER SUMMARY AND NOTE BELOW.       3. Breast, excision, Right medial posterior margin :      - BENIGN BREAST TISSUE.      - NEGATIVE FOR ATYPIA AND MALIGNANCY.      - SEE CANCER SUMMARY BELOW.   TUMOR Histologic Type: Invasive carcinoma of no special type (ductal) Histologic Grade (Nottingham Histologic Score) Glandular (Acinar)/Tubular Differentiation: 3 Nuclear Pleomorphism: 3 Mitotic Rate: 3 Overall Grade: 3 Tumor Size: Greatest dimension of largest invasive focus: 19 mm Ductal Carcinoma In Situ (DCIS): Present, high-grade with comedonecrosis Lymphatic and/or Vascular Invasion: Present, extensive (LVI in 2 or more blocks)  Treatment Effect in the Breast: No known presurgical therapy MARGINS Margin Status for Invasive Carcinoma: Invasive Carcinoma involves inferior (unifocal) and superior (unifocal) margins and is 0.5 mm to anterior margin Margin Status for DCIS: All margins negative for DCIS Distance from DCIS to closest margin: 0.5 mm Specify closest margin: Inferior REGIONAL LYMPH NODES Regional Lymph Node Status: Tumor present in regional lymph node(s) Number of Lymph Nodes with Macrometastases (greater than 2 mm): 0 Number of Lymph Nodes with Micrometastases (greater than 0.2 mm to 2 mm  and/or greater than 200 cells): 1 Number of Lymph Nodes with Isolated Tumor Cells (0.2 mm or less OR 200 cells or less): 0 Size of Largest Metastatic Deposit: 1.9 mm Extranodal Extension: Not identified Total Number of Lymph Nodes Examined (sentinel and non-sentinel): 5 Number of Sentinel Nodes Examined: 5 DISTANT METASTASIS Distant Site(s) Involved, if applicable: Not applicable PATHOLOGIC STAGE CLASSIFICATION (pTNM, AJCC 8th Edition): Modified Classification: Not applicable pT Category: pT1c T Suffix: (m) multiple primary synchronous tumors in a single organ pN Category: pN51mi N Suffix: (sn) pM Category: Not applicable SPECIAL STUDIES Breast Biomarker Testing Performed on Previous Biopsy: [id number] (heart) Estrogen Receptor: Negative (0%) Progesterone Receptor: Negative (0%) HER2 IHC: Negative (0) Proliferation Marker Ki67: 95%   07/22/2024 Cancer Staging   Staging form: Breast, AJCC 8th Edition - Pathologic stage from 07/22/2024: Stage IB (pT1c, pN50mi(sn), cM0, G3, ER-, PR-, HER2-) - Signed by Babara Call, MD on 07/22/2024 Stage prefix: Initial diagnosis Method of lymph node assessment: Sentinel lymph node biopsy Multigene prognostic tests performed: None Histologic grading system: 3 grade system   07/25/2024 Surgery   Patient reports additional excision for positive margin.  1. Breast, excision, superior margin of right breast - RESIDUAL INVASIVE DUCTAL CARCINOMA, COMPLETELY EXCISED. - CARCINOMA PRESENT 2 MM FROM THE POSTERIOR MARGIN AND 4 MM FROM THE ANTERIOR AND SUPERIOR MARGINS. 2. Breast, excision, inferior margin of right breast - RESIDUAL INVASIVE DUCTAL CARCINOMA, COMPLETELY EXCISED. - CARCINOMA PRESENT 1 MM FROM THE NEW INFERIOR MARGIN.   08/23/2024 Genetic Testing   Negative genetic testing. No pathogenic variants identified on the [facility] CancerNext-Expanded+RNA Panel. The report date is 08/23/2024.  The CancerNext-Expanded gene panel offered by [facility] and  includes sequencing, rearrangement, and RNA analysis for the following 77 genes: AIP, ALK, APC, ATM, AXIN2, BAP1, BARD1, BMPR1A, BRCA1, BRCA2, BRIP1, CDC73, CDH1, CDK4, CDKN1B, CDKN2A, CEBPA, CHEK2, CTNNA1, DDX41, DICER1, ETV6, FH, FLCN, GATA2, LZTR1, MAX, MBD4, MEN1, MET, MLH1, MSH2, MSH3, MSH6,  MUTYH, NF1, NF2, NTHL1, PALB2, PHOX2B, PMS2, POT1, PRKAR1A, PTCH1, PTEN, RAD51C, RAD51D, RB1, RET, RPS20, RUNX1, SDHA, SDHAF2, SDHB, SDHC, SDHD, SMAD4, SMARCA4, SMARCB1, SMARCE1, STK11, SUFU, TMEM127, TP53, TSC1, TSC2, VHL, and WT1 (sequencing and deletion/duplication); EGFR, HOXB13, KIT, MITF, PDGFRA, POLD1, and POLE (sequencing only); EPCAM and GREM1 (deletion/duplication only).    10/02/2024 -  Chemotherapy   Patient is on Treatment Plan : BREAST DOSE DENSE AC q14d / PACLitaxel q7d      Discussed the use of AI scribe software for clinical note transcription with the patient, who gave verbal consent to proceed.   Patient has a history of VIN 3 status post vulvectomy  07/11/2024,she was evaluated by cardiology with palpitations and atypical chest pain. Chest pain deemed atypical and she went on to have a normal echo at 07/19/2024.   07/23/2024, patient was seen by symptom management for right neck pain.  She was seen by Tinnie Dawn.  Right upper extremity ultrasound was obtained which showed acute occlusive DVT involving the right internal jugular and subclavian veins.  Patient was started on Lovenox , she was supposed to be started on 1 mg/kg twice daily however was started off 40 mg twice daily which is a subtherapeutic dosage. She was asked to hold off the evening dose prior to her breast re-excision.  07/29/2024, patient presented emergency room due to shortness of breath and chest pain.  CTA angiogram showed subsegmental PE medially in the left lower lobe.  Shortness of breath was felt to be multifactorial secondary to asthma/bronchitis, pulmonary embolism.  Symptoms are better after supportive care and she  was discharged on Lovenox  80 mg subcutaneously twice daily.  08/31/2024 -09/06/2024 hospitalized due to sepsis due to multifocal pneumonia, UTI. Respiratory viral panel positive for metapneumovirus  Today patient presented for post hospitalization.  She has persistent respiratory symptoms following a recent hospitalization for a viral infection with multifocal pneumonia. Symptoms include a persistent  sensation of mucus accumulation in her nasal passages, postnasal drip with mucus that varies in color from clear to green, and occasionally green with blood. She experiences nasal congestion and a sensation of talking in a 'foghorn'. These symptoms began during her second hospital stay. No fever or chills.   Patient resumed on radiation on 09/16/24. + diarrhea has resolved.   is currently on Lovenox  for anticoagulation.     MEDICAL HISTORY:  Past Medical History:  Diagnosis Date   Anemia    Anginal pain    Anxiety    Arthritis    Asthma    Cancer (HCC)    rectal cancer, breast cancer 2025   Chronic pain syndrome 05/21/2023   DDD (degenerative disc disease), cervical 05/22/2023   Deep venous thrombosis (HCC)    Depression    Dyspnea    Fibromyalgia    GERD (gastroesophageal reflux disease)    Headache    Heart murmur    History of hiatal hernia    History of kidney stones    Lupus    Malignant neoplasm of upper-outer quadrant of right female breast, unspecified estrogen receptor status (HCC) 2025   Pneumonia    PTSD (post-traumatic stress disorder)    Skin candidiasis 05/29/2024   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    SURGICAL HISTORY: Past Surgical History:  Procedure Laterality Date   BREAST BIOPSY Right 06/18/2024   US  RT BREAST BX W LOC DEV 1ST LESION IMG BX SPEC US  GUIDE 06/18/2024 [facility]   BREAST BIOPSY Right 06/18/2024   US  RT  BREAST BX W LOC DEV EA ADD LESION IMG BX SPEC US  GUIDE 06/18/2024 [facility]   BREAST BIOPSY Right  06/18/2024   US  RT BREAST BX W LOC DEV EA ADD LESION IMG BX SPEC US  GUIDE 06/18/2024 [facility]   BREAST BIOPSY Right 07/05/2024   US  RT BREAST SAVI/RF TAG 1ST LESION US  GUIDE 07/05/2024 [facility]   BREAST BIOPSY Right 07/05/2024   US  RT BREAST SAVI/RF TAG EA ADD'L LESION US  GUIDE 07/05/2024 [facility]   BREAST LUMPECTOMY Right 07/25/2024   Procedure: BREAST LUMPECTOMY;  Surgeon: Tye Millet, DO;  Location: [facility];  Service: General;  Laterality: Right;   BREAST LUMPECTOMY WITH RADIO FREQUENCY LOCALIZER Right 07/08/2024   Procedure: BREAST LUMPECTOMY WITH RADIO FREQUENCY LOCALIZER;  Surgeon: Tye Millet, DO;  Location: [facility];  Service: General;  Laterality: Right;   COLONOSCOPY WITH PROPOFOL  N/A 02/17/2023   Procedure: COLONOSCOPY WITH PROPOFOL ;  Surgeon: Maryruth Ole DASEN, MD;  Location: [facility];  Service: Endoscopy;  Laterality: N/A;   ESOPHAGOGASTRODUODENOSCOPY (EGD) WITH PROPOFOL  N/A 02/17/2023   Procedure: ESOPHAGOGASTRODUODENOSCOPY (EGD) WITH PROPOFOL ;  Surgeon: Maryruth Ole DASEN, MD;  Location: [facility];  Service: Endoscopy;  Laterality: N/A;   EXTRACORPOREAL SHOCK WAVE LITHOTRIPSY     IR CV LINE INJECTION  08/21/2024   IR REMOVAL TUN ACCESS W/ PORT W/O FL MOD SED  08/28/2024   PORTACATH PLACEMENT Right 07/08/2024   Procedure: INSERTION, TUNNELED CENTRAL VENOUS DEVICE, WITH PORT;  Surgeon: Tye Millet, DO;  Location: [facility];  Service: General;  Laterality: Right;   RECTAL EXAM UNDER ANESTHESIA N/A 05/16/2024   Procedure: EXAM UNDER ANESTHESIA, RECTUM, INCISIONAL BIOPSY OF ANAL MASS;  Surgeon: Tye Millet, DO;  Location: [facility];  Service: General;  Laterality: N/A;   SENTINEL NODE BIOPSY Right 07/08/2024   Procedure: BIOPSY, LYMPH NODE, SENTINEL;  Surgeon: Tye Millet, DO;  Location: [facility];  Service: General;  Laterality: Right;   TONSILLECTOMY     TUBAL LIGATION     VULVECTOMY N/A 07/29/2020   Procedure: WIDE EXCISION VULVECTOMY;  Surgeon:  Mancil Barter, MD;  Location: [facility];  Service: Gynecology;  Laterality: N/A;    SOCIAL HISTORY: Social History   Socioeconomic History   Marital status: Divorced    Spouse name: Not on file   Number of children: 1   Years of education: Not on file   Highest education level: Not on file  Occupational History   Not on file  Tobacco Use   Smoking status: Never   Smokeless tobacco: Never  Vaping Use   Vaping status: Never Used  Substance and Sexual Activity   Alcohol use: Not Currently   Drug use: No   Sexual activity: Yes  Other Topics Concern   Not on file  Social History Narrative   Not on file   Social Drivers of Health   Financial Resource Strain: Medium Risk (05/15/2024)   Received from [facility]   Overall Financial Resource Strain (CARDIA)    Difficulty of Paying Living Expenses: Somewhat hard  Food Insecurity: No Food Insecurity (09/07/2024)   Hunger Vital Sign    Worried About Running Out of Food in the Last Year: Never true    Ran Out of Food in the Last Year: Never true  Recent Concern: Food Insecurity - Food Insecurity Present (08/22/2024)   Hunger Vital Sign    Worried About Running Out of Food in the Last Year: Sometimes true    Ran Out of Food in the Last Year: Sometimes true  Transportation Needs: No Transportation Needs (09/07/2024)   PRAPARE - Administrator, Civil Service (Medical): No    Lack of Transportation (Non-Medical): No  Physical Activity: Not on file  Stress: Not on file  Social Connections: Not on file  Intimate Partner Violence: Not At Risk (09/07/2024)   Humiliation, Afraid, Rape, and Kick questionnaire    Fear of Current or Ex-Partner: No    Emotionally Abused: No    Physically Abused: No    Sexually Abused: No    FAMILY HISTORY: Family History  Problem Relation Age of Onset   Diabetes Mother    Congestive Heart Failure Mother    Colon cancer Father 68   Lupus Maternal Grandmother    Cancer  Maternal Grandfather        either prostate or colon   Colon cancer Paternal Grandfather    Lung cancer Paternal Grandfather     ALLERGIES:  is allergic to prednisone and silicone.  MEDICATIONS:  Current Outpatient Medications  Medication Sig Dispense Refill   albuterol  (VENTOLIN  HFA) 108 (90 Base) MCG/ACT inhaler Inhale 2 puffs into the lungs every 4 (four) hours as needed for wheezing or shortness of breath. 8.5 g 2   ascorbic acid (VITAMIN C) 500 MG tablet Take 500 mg by mouth daily.     azelastine (ASTELIN) 0.1 % nasal spray Place 1 spray into both nostrils 2 (two) times daily. Use in each nostril as directed 30 mL 0   benzonatate  (TESSALON ) 200 MG capsule Take 1 capsule (200 mg total) by mouth 3 (three) times daily as needed for cough. 30 capsule 1   BIOTIN PO Take by mouth daily.     budesonide  (PULMICORT ) 0.25 MG/2ML nebulizer solution Take 2 mLs (0.25 mg total) by nebulization 2 (two) times daily. 60 mL 12   busPIRone  (BUSPAR ) 5 MG tablet Take 5 mg by mouth 2 (two) times daily.     cholecalciferol (VITAMIN D3) 25 MCG (1000 UNIT) tablet Take 1,000 Units by mouth daily.     DULoxetine  (CYMBALTA ) 30 MG capsule Take 30 mg by mouth at bedtime.     enoxaparin  (LOVENOX ) 80 MG/0.8ML injection Inject 0.8 mLs (80 mg total) into the skin every 12 (twelve) hours. 48 mL 1   esomeprazole  (NEXIUM ) 40 MG capsule Take 1 capsule (40 mg total) by mouth 2 (two) times daily before a meal. 60 capsule 2   fentaNYL  (DURAGESIC ) 25 MCG/HR Place 1 patch onto the skin every 3 (three) days. 5 patch 0   gabapentin  (NEURONTIN ) 300 MG capsule Take 600 mg by mouth 2 (two) times daily.     HYDROcodone  bit-homatropine (HYCODAN) 5-1.5 MG/5ML syrup Take 5 mLs by mouth every 6 (six) hours as needed for cough. 120 mL 0   hydrocortisone (ANUSOL-HC) 2.5 % rectal cream Place 1 Application rectally 2 (two) times daily. 90 g 2   ipratropium-albuterol  (DUONEB) 0.5-2.5 (3) MG/3ML SOLN Take 3 mLs by nebulization 4 (four) times  daily. 360 mL 3   lidocaine  (LIDODERM ) 5 % 1 patch daily.     linaclotide (LINZESS) 145 MCG CAPS capsule Take 145 mcg by mouth daily before breakfast.     LORazepam (ATIVAN) 0.5 MG tablet Take 1 tablet (0.5 mg total) by mouth every 12 (twelve) hours as needed for anxiety (nausea). 30 tablet 0   midodrine (PROAMATINE) 10 MG tablet Take 1 tablet (10 mg total) by mouth 3 (three) times daily with meals. 90 tablet 0   montelukast  (SINGULAIR ) 10 MG tablet Take  1 tablet (10 mg total) by mouth See admin instructions. Take 1 tablet on the day prior to chemotherapy and take 1 tablet daily for 2 days after chemotherapy. 30 tablet 0   Multiple Vitamin (MULTIVITAMIN WITH MINERALS) TABS tablet Take 1 tablet by mouth daily.     nystatin  (MYCOSTATIN ) 100000 UNIT/ML suspension Take 5 mLs (500,000 Units total) by mouth 4 (four) times daily. Swish and swallow 473 mL 1   OLANZapine (ZYPREXA) 5 MG tablet Take 1 tablet (5 mg total) by mouth at bedtime. 30 tablet 3   oxyCODONE  (OXY IR/ROXICODONE ) 5 MG immediate release tablet Take 1 tablet (5 mg total) by mouth every 6 (six) hours as needed for severe pain (pain score 7-10) or breakthrough pain. 30 tablet 0   potassium chloride  SA (KLOR-CON  M) 20 MEQ tablet Take 1 tablet (20 mEq total) by mouth daily. 30 tablet 1   Turmeric (QC TUMERIC COMPLEX PO) Take by mouth daily.     capecitabine (XELODA) 500 MG tablet Take 3 tablets (1,500 mg total) by mouth 2 (two) times daily after a meal. Take within 30 minutes after meals. Take only on days when you have radiation. Start on 08/29/24. (Patient not taking: Reported on 09/17/2024) 60 tablet 0   No current facility-administered medications for this visit.    Review of Systems  Constitutional:  Positive for fatigue. Negative for chills and fever.  HENT:   Negative for hearing loss and voice change.        Nasal congestion  Eyes:  Negative for eye problems.  Respiratory:  Positive for cough. Negative for chest tightness and  shortness of breath.   Cardiovascular:  Negative for chest pain.  Gastrointestinal:  Positive for nausea. Negative for abdominal distention, abdominal pain, blood in stool and diarrhea.       Anal mass with drainage  Endocrine: Negative for hot flashes.  Genitourinary:  Negative for difficulty urinating and frequency.   Musculoskeletal:  Negative for arthralgias.  Skin:  Negative for itching and rash.  Neurological:  Negative for extremity weakness.  Hematological:  Negative for adenopathy.  Psychiatric/Behavioral:  Negative for confusion.      PHYSICAL EXAMINATION: ECOG PERFORMANCE STATUS: 0 - Asymptomatic  Vitals:   09/17/24 1438  BP: 105/70  Pulse: (!) 102  Resp: 18  Temp: (!) 97.2 F (36.2 C)  SpO2: 98%   Filed Weights   09/17/24 1438  Weight: 195 lb (88.5 kg)    Physical Exam Constitutional:      General: She is not in acute distress.    Appearance: She is not diaphoretic.  HENT:     Head: Normocephalic and atraumatic.     Mouth/Throat:     Pharynx: No oropharyngeal exudate.  Eyes:     General: No scleral icterus. Cardiovascular:     Rate and Rhythm: Normal rate and regular rhythm.     Heart sounds: No murmur heard. Pulmonary:     Effort: Pulmonary effort is normal. No respiratory distress.     Breath sounds: No wheezing.  Abdominal:     General: There is no distension.     Palpations: Abdomen is soft.     Tenderness: There is no abdominal tenderness.  Genitourinary:    Comments: anal mass previously 5cm, decreased in size Musculoskeletal:        General: Normal range of motion.     Cervical back: Normal range of motion and neck supple.     Comments: Right hand swelling.   Skin:  General: Skin is warm and dry.     Findings: No erythema.  Neurological:     Mental Status: She is alert and oriented to person, place, and time. Mental status is at baseline.     Motor: No abnormal muscle tone.  Psychiatric:        Mood and Affect: Affect normal.          LABORATORY DATA:  I have reviewed the data as listed    Latest Ref Rng & Units 09/17/2024    3:17 PM 09/13/2024    5:00 AM 09/12/2024    3:26 AM  CBC  WBC 4.0 - 10.5 K/uL 4.6  2.3  2.6   Hemoglobin 12.0 - 15.0 g/dL 89.9  8.0  8.1   Hematocrit 36.0 - 46.0 % 31.7  25.6  26.6   Platelets 150 - 400 K/uL 285  250  222       Latest Ref Rng & Units 09/17/2024    3:17 PM 09/12/2024    3:26 AM 09/09/2024    4:00 AM  CMP  Glucose 70 - 99 mg/dL 94  897  885   BUN 6 - 20 mg/dL 6  <5  7   Creatinine 9.55 - 1.00 mg/dL 9.29  9.53  9.32   Sodium 135 - 145 mmol/L 136  142  135   Potassium 3.5 - 5.1 mmol/L 3.7  4.4  4.1   Chloride 98 - 111 mmol/L 103  101  103   CO2 22 - 32 mmol/L 24  31  25    Calcium 8.9 - 10.3 mg/dL 8.5  8.6  7.8   Total Protein 6.5 - 8.1 g/dL 6.3     Total Bilirubin 0.0 - 1.2 mg/dL 0.8     Alkaline Phos 38 - 126 U/L 90     AST 15 - 41 U/L 36     ALT 0 - 44 U/L 18        RADIOGRAPHIC STUDIES: I have personally reviewed the radiological images as listed and agreed with the findings in the report. DG Chest 2 View Result Date: 09/10/2024 EXAM: 2 VIEW(S) XRAY OF THE CHEST 09/10/2024 08:42:00 AM COMPARISON: 09/06/2024 CLINICAL HISTORY: Recurrent fever, pneumonia FINDINGS: LINES, TUBES AND DEVICES: Left upper extremity PICC in place with tip at cavoatrial junction. LUNGS AND PLEURA: Low lung volumes. Small right pleural effusion. Bibasilar patchy airspace opacities. HEART AND MEDIASTINUM: No acute abnormality of the cardiac and mediastinal silhouettes. BONES AND SOFT TISSUES: No acute osseous abnormality. IMPRESSION: 1. Diffuse interstitial prominence and bibasilar patchy airspace opacities, suspicious for pneumonia. 2. Small right pleural effusion. Electronically signed by: Oneil Devonshire MD 09/10/2024 09:42 AM EDT RP Workstation: [id number]   CT Chest Wo Contrast Result Date: 09/07/2024 EXAM: CT CHEST WITHOUT CONTRAST 09/07/2024 03:02:30 AM TECHNIQUE: CT of the chest was  performed without the administration of intravenous contrast. Multiplanar reformatted images are provided for review. Automated exposure control, iterative reconstruction, and/or weight based adjustment of the mA/kV was utilized to reduce the radiation dose to as low as reasonably achievable. COMPARISON: Chest radiograph earlier today. CLINICAL HISTORY: Respiratory illness, nondiagnostic xray. Pt complaints of shortness of breath and chest tightness. Pt reports she was discharged from the hospital this morning. Pt reports she is not able to carry on a conversations without feeling short of breath. Pt reports she was admitted to hospital due to sepsis. FINDINGS: MEDIASTINUM: Trace pericardial effusion. Left arm PICC terminates in the mid SVC. The central airways are clear. LYMPH NODES:  Subcentimeter mediastinal lymph nodes, likely reactive. No hilar or axillary lymphadenopathy. LUNGS AND PLEURA: Multifocal patchy right lung opacities, right lower lobe predominant, favoring multifocal pneumonia. Mild left basilar opacity, atelectasis versus pneumonia. Small bilateral pleural effusions. No pneumothorax. SOFT TISSUES/BONES: No acute abnormality of the bones or soft tissues. UPPER ABDOMEN: Limited images of the upper abdomen demonstrate mild hepatic steatosis. IMPRESSION: 1. Multifocal patchy right lung opacities, right lower lobe predominant, favoring multifocal pneumonia. 2. Mild left basilar opacity, atelectasis versus pneumonia. 3. Small bilateral pleural effusions. Electronically signed by: Pinkie Pebbles MD 09/07/2024 03:05 AM EDT RP Workstation: [id number]   DG Chest 1 View Result Date: 09/07/2024 EXAM: 1 VIEW(S) XRAY OF THE CHEST 09/07/2024 12:01:00 AM COMPARISON: 09/03/2024 CLINICAL HISTORY: [id number] Shortness of breath 10026. Pt presents to ER from home with complaints of shortness of breath and chest tightness. Pt reports she was discharged from the hospital this morning. Pt reports she is not able to carry  on a conversations without feeling short of breath. Pt reports she ; was admitted to hospital due to sepsis. Pt also reports she thinks her PICC line is infected. Pt presents to ER from home with complaints of shortness of breath and chest tightness. Pt reports she was discharged from the hospital this morning. Pt reports she is not able to carry on a conversations without feeling short of breath. Pt reports she ; was admitted to hospital due to sepsis. Pt also reports she thinks her PICC line is infected. FINDINGS: LINES, TUBES AND DEVICES: Stable left arm PICC at the cavoatrial junction. LUNGS AND PLEURA: New patchy right lower lobe opacity, favoring pneumonia over atelectasis. No pulmonary edema. No pleural effusion. No pneumothorax. HEART AND MEDIASTINUM: No acute abnormality of the cardiac and mediastinal silhouettes. BONES AND SOFT TISSUES: No acute osseous abnormality. IMPRESSION: 1. New patchy right lower lobe opacity, favoring pneumonia over atelectasis. Electronically signed by: Pinkie Pebbles MD 09/07/2024 12:05 AM EDT RP Workstation: [id number]   US  Venous Img Lower Unilateral Right (DVT) Result Date: 09/05/2024 CLINICAL DATA:  Right lower extremity pain, edema and warmth. History of prior DVT. EXAM: RIGHT LOWER EXTREMITY VENOUS DOPPLER ULTRASOUND TECHNIQUE: Gray-scale sonography with graded compression, as well as color Doppler and duplex ultrasound were performed to evaluate the lower extremity deep venous systems from the level of the common femoral vein and including the common femoral, femoral, profunda femoral, popliteal and calf veins including the posterior tibial, peroneal and gastrocnemius veins when visible. The superficial great saphenous vein was also interrogated. Spectral Doppler was utilized to evaluate flow at rest and with distal augmentation maneuvers in the common femoral, femoral and popliteal veins. COMPARISON:  None Available. FINDINGS: Contralateral Common Femoral Vein:  Respiratory phasicity is normal and symmetric with the symptomatic side. No evidence of thrombus. Normal compressibility. Common Femoral Vein: No evidence of thrombus. Normal compressibility, respiratory phasicity and response to augmentation. Saphenofemoral Junction: No evidence of thrombus. Normal compressibility and flow on color Doppler imaging. Profunda Femoral Vein: No evidence of thrombus. Normal compressibility and flow on color Doppler imaging. Femoral Vein: No evidence of thrombus. Normal compressibility, respiratory phasicity and response to augmentation. Popliteal Vein: No evidence of thrombus. Normal compressibility, respiratory phasicity and response to augmentation. Calf Veins: No evidence of thrombus. Normal compressibility and flow on color Doppler imaging. Superficial Great Saphenous Vein: No evidence of thrombus. Normal compressibility. Venous Reflux:  None. Other Findings: No evidence of superficial thrombophlebitis or abnormal fluid collection. IMPRESSION: No evidence of right lower extremity deep venous thrombosis. Electronically Signed  By: Marcey Moan M.D.   On: 09/05/2024 07:49   CT ABDOMEN PELVIS W CONTRAST Result Date: 09/03/2024 CLINICAL DATA:  Urinary tract infection, right breast cancer EXAM: CT ABDOMEN AND PELVIS WITH CONTRAST TECHNIQUE: Multidetector CT imaging of the abdomen and pelvis was performed using the standard protocol following bolus administration of intravenous contrast. RADIATION DOSE REDUCTION: This exam was performed according to the departmental dose-optimization program which includes automated exposure control, adjustment of the mA and/or kV according to patient size and/or use of iterative reconstruction technique. CONTRAST:  OMNIPAQUE  IOHEXOL  300 MG/ML  SOLN COMPARISON:  08/17/2024 FINDINGS: Lower chest: Right lower lobe airspace disease consistent with pneumonia. Trace right effusion. No pneumothorax. Skin thickening within the right breast likely  post therapeutic in this patient with a known prior history of right breast cancer and lumpectomy. Hepatobiliary: No focal liver abnormality is seen. No gallstones, gallbladder wall thickening, or biliary dilatation. Pancreas: Unremarkable. No pancreatic ductal dilatation or surrounding inflammatory changes. Spleen: Normal in size without focal abnormality. Adrenals/Urinary Tract: Adrenal glands are unremarkable. Kidneys are normal, without renal calculi, focal lesion, or hydronephrosis. Bladder is unremarkable. Stomach/Bowel: No bowel obstruction or ileus. Moderate stool within the colon consistent with constipation. Normal appendix right lower quadrant. No bowel wall thickening or inflammatory change. Vascular/Lymphatic: No significant vascular findings are present. No enlarged abdominal or pelvic lymph nodes. Reproductive: Uterus and bilateral adnexa are unremarkable. Other: Trace pelvic free fluid, nonspecific. No free intraperitoneal gas. No abdominal wall hernia. Musculoskeletal: Sequela of multiple subcutaneous injections within the anterior abdominal wall. No acute or destructive bony abnormalities. Reconstructed images demonstrate no additional findings. IMPRESSION: 1. Right lower lobe pneumonia. 2. Trace pelvic free fluid, likely physiologic. 3. Moderate retained stool throughout the colon consistent with constipation. 4. Likely post therapeutic skin thickening within the right breast in this patient with a known history of right breast cancer. Electronically Signed   By: Ozell Daring M.D.   On: 09/03/2024 20:39   DG Chest 1 View Result Date: 09/03/2024 CLINICAL DATA:  Pneumonia. EXAM: DG CHEST 1V COMPARISON:  08/31/2024 FINDINGS: Left-sided PICC line unchanged with tip over the SVC. Lungs are hypoinflated with minimal stable prominence of the central pulmonary vasculature. No acute consolidation or effusion. Cardiomediastinal silhouette and remainder the exam is unchanged. IMPRESSION: Hypoinflation  without acute cardiopulmonary disease. Electronically Signed   By: Toribio Agreste M.D.   On: 09/03/2024 08:54   DG Chest Port 1 View Result Date: 08/31/2024 EXAM: 1 VIEW XRAY OF THE CHEST 08/31/2024 07:31:10 PM COMPARISON: 08/13/2024 CLINICAL HISTORY: Questionable sepsis - evaluate for abnormality. Appears to be some bleeding around the site; patient is concerned that dressing may need to be changed, states site did not appear that way when she was last scene at [facility] on Thursday. Ambulatory to triage, NAD noted at this time. FINDINGS: LINES, TUBES AND DEVICES: Interval removal of a right chest wall port-a-cath. Interval placement of a left PICC with tip overlying the expected region of the distal superior vena cava. LUNGS AND PLEURA: Slightly low lung volumes. No focal pulmonary opacity. No pulmonary edema. No pleural effusion. No pneumothorax. HEART AND MEDIASTINUM: Slightly prominent cardiac silhouette likely due to AP portable technique and low lung volumes . No acute abnormality of the mediastinal silhouette. BONES AND SOFT TISSUES: No acute osseous abnormality. IMPRESSION: 1. Low lung volumes with no definite acute cardiopulmonary process. 2. Apparent cardiomegaly on AP portable; consider repeat PA and lateral chest radiographs for confirmation. Electronically signed by: Morgane Naveau MD  08/31/2024 07:34 PM EDT RP Workstation: [id number]   IR REMOVAL TUN ACCESS W/ PORT W/O FL MOD SED Result Date: 08/28/2024 INDICATION: 48 year old with malpositioned right jugular Port-A-Cath and thrombus in the right internal jugular vein. Patient needs removal of the Port-A-Cath and placement of a PICC line for treatment of anal cancer. EXAM: 1.  PICC LINE PLACEMENT WITH ULTRASOUND AND FLUOROSCOPIC GUIDANCE 2.  REMOVAL OF RIGHT CHEST PORT-A-CATH MEDICATIONS: No sedation ANESTHESIA/SEDATION: No sedation FLUOROSCOPY: Radiation Exposure Index (as provided by the fluoroscopic device): 8 mGy Kerma COMPLICATIONS:  None immediate. PROCEDURE: The patient was advised of the possible risks and complications and agreed to undergo the procedure. The patient was then brought to the angiographic suite for the procedure. The left arm was prepped with chlorhexidine , draped in the usual sterile fashion using maximum barrier technique (cap and mask, sterile gown, sterile gloves, large sterile sheet, hand hygiene and cutaneous antiseptic). Local anesthesia was attained by infiltration with 1% lidocaine . Ultrasound demonstrated patency of the left basilic vein, and this was documented with an image. Under real-time ultrasound guidance, this vein was accessed with a 21 gauge micropuncture needle and image documentation was performed. The needle was exchanged over a guidewire for a peel-away sheath through which a 39 cm 5 French single lumen power injectable PICC was advanced, and positioned with its tip at the lower SVC/right atrial junction. Fluoroscopy during the procedure and fluoro spot radiograph confirms appropriate catheter position. The catheter was flushed, secured to the skin, and covered with a sterile dressing. The right chest was prepped and draped in a sterile fashion. 1% lidocaine  with epinephrine  was utilized for local anesthesia. An incision was made over the previously healed surgical incision. Utilizing blunt dissection, the port catheter and reservoir were removed from the underlying subcutaneous tissue in their entirety. Retention sutures were removed. The pocket was irrigated with a copious amount of sterile normal saline. The subcutaneous tissue was closed with 3-0 Vicryl interrupted subcutaneous stitches. A 4-0 Vicryl running subcuticular stitch was utilized to approximate the skin. Dermabond was applied. Dressing was placed. Fluoroscopic image was obtained after removal of the Port-A-Cath. IMPRESSION: Successful placement of a left arm PICC line with sonographic and fluoroscopic guidance. The catheter is ready for  use. Successful removal of the right chest Port-A-Cath. Electronically Signed   By: Juliene Balder M.D.   On: 08/28/2024 16:45   IR PICC PLACEMENT LEFT >5 YRS INC IMG GUIDE Result Date: 08/28/2024 INDICATION: 48 year old with malpositioned right jugular Port-A-Cath and thrombus in the right internal jugular vein. Patient needs removal of the Port-A-Cath and placement of a PICC line for treatment of anal cancer. EXAM: 1.  PICC LINE PLACEMENT WITH ULTRASOUND AND FLUOROSCOPIC GUIDANCE 2.  REMOVAL OF RIGHT CHEST PORT-A-CATH MEDICATIONS: No sedation ANESTHESIA/SEDATION: No sedation FLUOROSCOPY: Radiation Exposure Index (as provided by the fluoroscopic device): 8 mGy Kerma COMPLICATIONS: None immediate. PROCEDURE: The patient was advised of the possible risks and complications and agreed to undergo the procedure. The patient was then brought to the angiographic suite for the procedure. The left arm was prepped with chlorhexidine , draped in the usual sterile fashion using maximum barrier technique (cap and mask, sterile gown, sterile gloves, large sterile sheet, hand hygiene and cutaneous antiseptic). Local anesthesia was attained by infiltration with 1% lidocaine . Ultrasound demonstrated patency of the left basilic vein, and this was documented with an image. Under real-time ultrasound guidance, this vein was accessed with a 21 gauge micropuncture needle and image documentation was performed. The needle was exchanged over  a guidewire for a peel-away sheath through which a 39 cm 5 French single lumen power injectable PICC was advanced, and positioned with its tip at the lower SVC/right atrial junction. Fluoroscopy during the procedure and fluoro spot radiograph confirms appropriate catheter position. The catheter was flushed, secured to the skin, and covered with a sterile dressing. The right chest was prepped and draped in a sterile fashion. 1% lidocaine  with epinephrine  was utilized for local anesthesia. An incision was  made over the previously healed surgical incision. Utilizing blunt dissection, the port catheter and reservoir were removed from the underlying subcutaneous tissue in their entirety. Retention sutures were removed. The pocket was irrigated with a copious amount of sterile normal saline. The subcutaneous tissue was closed with 3-0 Vicryl interrupted subcutaneous stitches. A 4-0 Vicryl running subcuticular stitch was utilized to approximate the skin. Dermabond was applied. Dressing was placed. Fluoroscopic image was obtained after removal of the Port-A-Cath. IMPRESSION: Successful placement of a left arm PICC line with sonographic and fluoroscopic guidance. The catheter is ready for use. Successful removal of the right chest Port-A-Cath. Electronically Signed   By: Juliene Balder M.D.   On: 08/28/2024 16:45   IR CV Line Injection Result Date: 08/21/2024 INDICATION: 48 year old female with a surgically placed right chest port catheter. Catheter is malfunctioning and the patient presents for port injection. EXAM: IR NON-TUNNELED CENTRAL VENOUS CATH PLC W IMG MEDICATIONS: None ANESTHESIA/SEDATION: None FLUOROSCOPY TIME:  Radiation exposure index: 10 mGy, air kerma COMPLICATIONS: None immediate. PROCEDURE: Informed written consent was obtained from the patient after a thorough discussion of the procedural risks, benefits and alternatives. All questions were addressed. Maximal Sterile Barrier Technique was utilized including caps, mask, sterile gowns, sterile gloves, sterile drape, hand hygiene and skin antiseptic. A timeout was performed prior to the initiation of the procedure. Fluoroscopic evaluation demonstrates a right IJ port catheter. The entry site into the internal jugular vein appears high and the external portion of the catheter tubing is redundant in the soft tissues of the right neck. Contrast was next injected. The tip of the catheter overlies the right innominate vein. No contrast enters the vein at this  point, the contrast tracks back along the catheter where it fills retrograde into the internal jugular vein. A large tubular filling defect is present within the right internal jugular vein consistent with thrombus. No contrast material passes into the innominate vein. Contrast material instead fills the anterior jugular vein an other venous collaterals draining into the left innominate vein. IMPRESSION: 1. Malpositioned right IJ approach port catheter with significant fibrin sheath completely occluding the catheter tip. 2. Occlusive thrombus is present within the right internal jugular vein. Recommend removal of this nonfunctional device and placement of a PICC to allow for continued venous access. After several weeks, right upper extremity duplex venous ultrasound could be performed to assess patency of the right internal jugular vein and, if patent, placement of a new port catheter could be considered at that time. Electronically Signed   By: Wilkie Lent M.D.   On: 08/21/2024 15:26

## 2024-09-17 NOTE — Addendum Note (Signed)
 Addended by: BABARA CALL on: 09/17/2024 08:36 PM   Modules accepted: Orders

## 2024-09-18 ENCOUNTER — Encounter: Payer: Self-pay | Admitting: Oncology

## 2024-09-18 ENCOUNTER — Other Ambulatory Visit: Payer: Self-pay

## 2024-09-18 ENCOUNTER — Ambulatory Visit

## 2024-09-18 ENCOUNTER — Ambulatory Visit
Admission: RE | Admit: 2024-09-18 | Discharge: 2024-09-18 | Disposition: A | Source: Ambulatory Visit | Attending: Radiation Oncology | Admitting: Radiation Oncology

## 2024-09-18 ENCOUNTER — Inpatient Hospital Stay

## 2024-09-18 DIAGNOSIS — Z5181 Encounter for therapeutic drug level monitoring: Secondary | ICD-10-CM

## 2024-09-18 LAB — RAD ONC ARIA SESSION SUMMARY
Course Elapsed Days: 48
Plan Fractions Treated to Date: 28
Plan Prescribed Dose Per Fraction: 1.8 Gy
Plan Total Fractions Prescribed: 30
Plan Total Prescribed Dose: 54 Gy
Reference Point Dosage Given to Date: 50.4 Gy
Reference Point Session Dosage Given: 1.8 Gy
Session Number: 28

## 2024-09-18 NOTE — Addendum Note (Signed)
 Addended by: BABARA CALL on: 09/18/2024 03:28 PM   Modules accepted: Orders

## 2024-09-18 NOTE — Progress Notes (Signed)
 Pharmacist Chemotherapy Monitoring - Initial Assessment    Anticipated start date: 10/02/24   The following has been reviewed per standard work regarding the patient's treatment regimen: The patient's diagnosis, treatment plan and drug doses, and organ/hematologic function Lab orders and baseline tests specific to treatment regimen  The treatment plan start date, drug sequencing, and pre-medications Prior authorization status  Patient's documented medication list, including drug-drug interaction screen and prescriptions for anti-emetics and supportive care specific to the treatment regimen The drug concentrations, fluid compatibility, administration routes, and timing of the medications to be used The patient's access for treatment and lifetime cumulative dose history, if applicable  The patient's medication allergies and previous infusion related reactions, if applicable  Anal squamous cell carcinoma, 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

## 2024-09-19 ENCOUNTER — Other Ambulatory Visit: Payer: Self-pay

## 2024-09-19 ENCOUNTER — Ambulatory Visit

## 2024-09-19 ENCOUNTER — Ambulatory Visit
Admission: RE | Admit: 2024-09-19 | Discharge: 2024-09-19 | Attending: Radiation Oncology | Admitting: Radiation Oncology

## 2024-09-19 LAB — RAD ONC ARIA SESSION SUMMARY
Course Elapsed Days: 49
Plan Fractions Treated to Date: 29
Plan Prescribed Dose Per Fraction: 1.8 Gy
Plan Total Fractions Prescribed: 30
Plan Total Prescribed Dose: 54 Gy
Reference Point Dosage Given to Date: 52.2 Gy
Reference Point Session Dosage Given: 1.8 Gy
Session Number: 29

## 2024-09-20 ENCOUNTER — Ambulatory Visit

## 2024-09-20 ENCOUNTER — Ambulatory Visit: Attending: Oncology

## 2024-09-20 ENCOUNTER — Inpatient Hospital Stay

## 2024-09-20 NOTE — Progress Notes (Signed)
 Nutrition  Patient did not show up for scheduled nutrition visit.  Message sent to scheduling to offer another appointment.   Chancy Smigiel B. Dasie SOLON, CSO, LDN Registered Dietitian [phone]

## 2024-09-23 ENCOUNTER — Inpatient Hospital Stay

## 2024-09-23 ENCOUNTER — Encounter: Payer: Self-pay | Admitting: Oncology

## 2024-09-23 ENCOUNTER — Ambulatory Visit

## 2024-09-23 ENCOUNTER — Inpatient Hospital Stay (HOSPITAL_BASED_OUTPATIENT_CLINIC_OR_DEPARTMENT_OTHER): Admitting: Oncology

## 2024-09-23 ENCOUNTER — Other Ambulatory Visit: Payer: Self-pay

## 2024-09-23 VITALS — BP 117/73 | HR 105 | Temp 96.9°F | Resp 18 | Wt 186.0 lb

## 2024-09-23 DIAGNOSIS — E876 Hypokalemia: Secondary | ICD-10-CM

## 2024-09-23 DIAGNOSIS — I82C11 Acute embolism and thrombosis of right internal jugular vein: Secondary | ICD-10-CM

## 2024-09-23 DIAGNOSIS — C50919 Malignant neoplasm of unspecified site of unspecified female breast: Secondary | ICD-10-CM

## 2024-09-23 DIAGNOSIS — C210 Malignant neoplasm of anus, unspecified: Secondary | ICD-10-CM

## 2024-09-23 DIAGNOSIS — T451X5A Adverse effect of antineoplastic and immunosuppressive drugs, initial encounter: Secondary | ICD-10-CM

## 2024-09-23 DIAGNOSIS — K627 Radiation proctitis: Secondary | ICD-10-CM

## 2024-09-23 DIAGNOSIS — I2699 Other pulmonary embolism without acute cor pulmonale: Secondary | ICD-10-CM

## 2024-09-23 DIAGNOSIS — R110 Nausea: Secondary | ICD-10-CM

## 2024-09-23 DIAGNOSIS — R197 Diarrhea, unspecified: Secondary | ICD-10-CM | POA: Diagnosis not present

## 2024-09-23 DIAGNOSIS — K521 Toxic gastroenteritis and colitis: Secondary | ICD-10-CM

## 2024-09-23 DIAGNOSIS — Z5181 Encounter for therapeutic drug level monitoring: Secondary | ICD-10-CM

## 2024-09-23 LAB — CMP (CANCER CENTER ONLY)
ALT: 44 U/L (ref 0–44)
AST: 75 U/L — ABNORMAL HIGH (ref 15–41)
Albumin: 3 g/dL — ABNORMAL LOW (ref 3.5–5.0)
Alkaline Phosphatase: 80 U/L (ref 38–126)
Anion gap: 7 (ref 5–15)
BUN: 5 mg/dL — ABNORMAL LOW (ref 6–20)
CO2: 23 mmol/L (ref 22–32)
Calcium: 8.6 mg/dL — ABNORMAL LOW (ref 8.9–10.3)
Chloride: 105 mmol/L (ref 98–111)
Creatinine: 0.71 mg/dL (ref 0.44–1.00)
GFR, Estimated: >60 mL/min (ref >60)
Glucose, Bld: 109 mg/dL — ABNORMAL HIGH (ref 70–99)
Potassium: 3.1 mmol/L — ABNORMAL LOW (ref 3.5–5.1)
Sodium: 135 mmol/L (ref 135–145)
Total Bilirubin: 0.7 mg/dL (ref 0.0–1.2)
Total Protein: 6.3 g/dL — ABNORMAL LOW (ref 6.5–8.1)

## 2024-09-23 LAB — CBC WITH DIFFERENTIAL (CANCER CENTER ONLY)
Abs Immature Granulocytes: 0.03 K/uL (ref 0.00–0.07)
Basophils Absolute: 0 K/uL (ref 0.0–0.1)
Basophils Relative: 1 %
Eosinophils Absolute: 0.1 K/uL (ref 0.0–0.5)
Eosinophils Relative: 2 %
HCT: 33.8 % — ABNORMAL LOW (ref 36.0–46.0)
Hemoglobin: 11 g/dL — ABNORMAL LOW (ref 12.0–15.0)
Immature Granulocytes: 1 %
Lymphocytes Relative: 9 %
Lymphs Abs: 0.5 K/uL — ABNORMAL LOW (ref 0.7–4.0)
MCH: 32.5 pg (ref 26.0–34.0)
MCHC: 32.5 g/dL (ref 30.0–36.0)
MCV: 100 fL (ref 80.0–100.0)
Monocytes Absolute: 0.8 K/uL (ref 0.1–1.0)
Monocytes Relative: 16 %
Neutro Abs: 3.5 K/uL (ref 1.7–7.7)
Neutrophils Relative %: 71 %
Platelet Count: 322 K/uL (ref 150–400)
RBC: 3.38 MIL/uL — ABNORMAL LOW (ref 3.87–5.11)
RDW: 18.5 % — ABNORMAL HIGH (ref 11.5–15.5)
WBC Count: 5 K/uL (ref 4.0–10.5)
nRBC: 0 % (ref 0.0–0.2)

## 2024-09-23 LAB — MAGNESIUM: Magnesium: 2 mg/dL (ref 1.7–2.4)

## 2024-09-23 MED ORDER — SODIUM CHLORIDE 0.9 % IV SOLN
Freq: Once | INTRAVENOUS | Status: AC
  Filled 2024-09-23: qty 250

## 2024-09-23 MED ORDER — POTASSIUM CHLORIDE 20 MEQ/100ML IV SOLN: 20.0000 meq | Freq: Once | INTRAVENOUS | Status: DC

## 2024-09-23 MED ORDER — DEXAMETHASONE SOD PHOSPHATE PF 10 MG/ML IJ SOLN
10.0000 mg | Freq: Once | INTRAMUSCULAR | Status: AC
  Administered 2024-09-23: 10 mg via INTRAVENOUS

## 2024-09-23 MED ORDER — DIPHENOXYLATE-ATROPINE 2.5-0.025 MG PO TABS: 1.0000 | ORAL_TABLET | Freq: Four times a day (QID) | ORAL | 0 refills | Status: DC

## 2024-09-23 MED ORDER — ENOXAPARIN SODIUM 80 MG/0.8ML IJ SOSY: 80.0000 mg | PREFILLED_SYRINGE | Freq: Two times a day (BID) | INTRAMUSCULAR | 2 refills | Status: AC

## 2024-09-23 MED ORDER — POTASSIUM CHLORIDE 10 MEQ/100ML IV SOLN
10.0000 meq | Freq: Once | INTRAVENOUS | Status: AC
  Administered 2024-09-23: 10 meq via INTRAVENOUS
  Filled 2024-09-23: qty 100

## 2024-09-23 MED ORDER — POTASSIUM CHLORIDE CRYS ER 20 MEQ PO TBCR: 20.0000 meq | EXTENDED_RELEASE_TABLET | Freq: Two times a day (BID) | ORAL | 0 refills | Status: DC

## 2024-09-23 MED ORDER — PROCHLORPERAZINE EDISYLATE 10 MG/2ML IJ SOLN
10.0000 mg | Freq: Once | INTRAMUSCULAR | Status: AC
  Administered 2024-09-23: 10 mg via INTRAVENOUS
  Filled 2024-09-23: qty 2

## 2024-09-23 NOTE — Assessment & Plan Note (Signed)
 Continue Lovenox 1 mg/kg twice daily

## 2024-09-23 NOTE — Assessment & Plan Note (Signed)
 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 Continue Lovenox  1 mg/kg twice daily-80 mg every 12 hours.  anti Xa level was  in therapeutic/supratherapeutic range.

## 2024-09-23 NOTE — Assessment & Plan Note (Signed)
 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 mT1c [id number] Initial margin was positive and patient underwent reexcision and achieved a negative margin.  Patient's case is complicated as evidenced by 2 cancer primaries.  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 Plan to have proceed with concurrent chemotherapy and radiation treatment for Stage III anal SCC first.  After that she will need to have adjuvant chemotherapy for triple negative breast cancer.  Plan to start treatment a few weeks after she finishes anal cancer treatments. Plan ddAC followed by weekly Taxol. Obtain MUGA/Echo

## 2024-09-23 NOTE — Assessment & Plan Note (Addendum)
 Secondary to chemotherapy and radiation.  Recommend Lomotil 1 tablet p.o. 4 times daily Check stool studies

## 2024-09-23 NOTE — Assessment & Plan Note (Signed)
 Increase potassium 20 mEq twice daily.  Patient will get IV potassium chloride  10 mEq x 1

## 2024-09-23 NOTE — Assessment & Plan Note (Signed)
 Anusol rectal cream topically

## 2024-09-23 NOTE — Assessment & Plan Note (Addendum)
 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  Per IR, right inguinal lymph node is not enlarged.  Previously on concurrent chemotherapy 5-FU and mitomycin  C with Radiation. S/p D1 5-FU and Mitomycin -C. Patient is on radiation on  Medi port was occluded and has been removed. 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  Patient has PICC line placement.  This is a complicated case.  Continuous 5-FU infusion via PICC line is not considered safe.  She has been on Xeloda 1500 mg twice daily on days of radiation.  She has 1 more dose of radiation today which she plans to reschedule. I recommend to omit last 2 doses of Xeloda.

## 2024-09-23 NOTE — Assessment & Plan Note (Signed)
 Continue home antiemetics as needed.  Added lorazepam 0.5 every 12 hours as needed anxiety and nausea.   Patient will get IV fluid sessions today, along with IV dexamethasone  10 mg, IV Compazine  10 mg

## 2024-09-23 NOTE — Progress Notes (Signed)
 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  Per IR, right inguinal lymph node is not enlarged.  Previously on concurrent chemotherapy 5-FU and mitomycin  C with Radiation. S/p D1 5-FU and Mitomycin -C. Patient is on radiation on  Medi port was occluded and has been removed. 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  Patient has PICC line placement.  This is a complicated case.  Continuous 5-FU infusion via PICC line is not considered safe.  She has been on Xeloda 1500 mg twice daily on days of radiation.  She has 1 more dose of radiation today which she plans to reschedule. I recommend to omit last 2 doses of Xeloda.  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 mT1c pN86mi Initial margin was positive and patient underwent reexcision and achieved a negative margin.  Patient's case is complicated as evidenced by 2 cancer primaries.  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 Plan to have proceed with concurrent chemotherapy and radiation treatment for Stage III anal SCC first.  After that she will need to have adjuvant chemotherapy for triple negative breast cancer.  Plan to start treatment a few weeks after she finishes anal cancer treatments. Plan ddAC followed by weekly Taxol. Obtain MUGA/Echo  Acute subsegmental pulmonary embolism (HCC) Continue Lovenox  1 mg/kg twice daily.  Chemotherapy induced diarrhea Secondary to chemotherapy and radiation.   Recommend Lomotil 1 tablet p.o. 4 times daily Check stool studies  Chemotherapy-induced nausea Continue home antiemetics as needed.  Added lorazepam 0.5 every 12 hours as needed anxiety and nausea.   Patient will get IV fluid sessions today, along with IV dexamethasone  10 mg, IV Compazine  10 mg  Hypokalemia Increase potassium 20 mEq twice daily.  Patient will get IV potassium chloride  10 mEq x 1  Internal jugular vein thrombosis, right (HCC) acute occlusive DVT involving the right internal jugular and subclavian veins. Continue Lovenox  1 mg/kg twice daily-80 mg every 12 hours.  anti Xa level was  in therapeutic/supratherapeutic range.     Radiation proctitis Anusol rectal cream topically     Orders Placed This Encounter  Procedures   Gastrointestinal Panel by PCR , Stool    Standing Status:   Future    Expected Date:   09/23/2024    Expiration Date:   12/22/2024   C difficile quick screen w PCR reflex    Standing Status:   Future    Expected Date:   09/23/2024    Expiration Date:   12/22/2024   CMP (Cancer Center only)    Standing Status:   Future    Expected Date:   10/01/2024    Expiration Date:   12/30/2024   Magnesium     Standing Status:   Future    Expected Date:   10/01/2024    Expiration Date:   12/30/2024   Follow-up 1 week  lab NP IV fluid 2 weeks lab MD All questions were answered. The patient knows to call the clinic with any problems, questions or concerns.  [name], MD, PhD [facility]  Hematology Oncology 09/23/2024    HISTORY OF PRESENTING ILLNESS:  Monica Gonzalez 48 y.o. 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 Summary of oncologic history is as follows: Oncology History  Anal squamous cell carcinoma (HCC)  05/29/2024 Initial Diagnosis   Anal squamous cell carcinoma    Patient has noticed a hard mass at the anal area which she initially thought was a hemorrhoid.  The mass has been present for more than a month and appears to be enlarging. Patient was referred to establish care with surgeon Dr. Tye  05/16/2024, incisional biopsy of anal mass showed 1. Anus, biopsy, mass :       - INVASIVE MODERATELY DIFFERENTIATED SQUAMOUS CELL CARCINOMA  06/19/2024, left inguinal adenopathy biopsy showed Metastatic moderately differentiated squamous cell carcinoma.  '    05/29/2024 Cancer Staging   Staging form: Anus, AJCC V9 - Clinical stage from 05/29/2024: Stage IIIA (cT3, cN1a, cM0) - Signed by Babara Call, MD on 06/26/2024 Stage prefix: Initial diagnosis   06/05/2024 Imaging   CT chest abdomen pelvis with contrast  Slight thickening in the area of the anal canal. Please correlate for history of neoplasm. There is a pathologic enlarged left inguinal node. There is a borderline right-sided node.   No additional areas of abnormal nodal enlargement or other aggressive appearing mass lesion at this time.   Fatty liver infiltration with known segment 7 hepatic hemangioma.   Of note there is a right-sided upper breast mass which was not clearly seen on prior CT scan. Please correlate for any prior study or if needed diagnostic mammographic evaluation and possible ultrasound when appropriate.     06/12/2024 Imaging   CT chest abdomen pelvis with contrast showed Slight thickening in the area of the anal canal. Please correlate for history of neoplasm. There is a pathologic enlarged left inguinal node. There is a borderline right-sided node.   No additional areas of abnormal nodal enlargement or other aggressive appearing mass lesion at this time.   Fatty liver infiltration with known segment 7 hepatic hemangioma.   Of note there is a right-sided upper breast mass which was not clearly seen on prior CT scan. Please correlate for any prior study or if needed  diagnostic mammographic evaluation and possible ultrasound when appropriate.   06/19/2024 Procedure   1. Lymph node, biopsy, left inguinal adenopathy :       -  METASTATIC MODERATELY DIFFERENTIATED SQUAMOUS CELL CARCINOMA    07/01/2024 Imaging   PET scan showed 1. Hypermetabolic anal mass with hypermetabolic left inguinal adenopathy, compatible with anorectal carcinoma. 2. Hypermetabolic right breast nodule with recent biopsies on 06/18/2024. Please correlate with pathology report.   07/31/2024 - 07/31/2024 Chemotherapy   Patient is on Treatment Plan : ANUS Mitomycin  D1,28 + 5FU D1-4, 28-31 q32d     08/23/2024 Genetic Testing   Negative genetic testing. No pathogenic variants identified on the [facility] CancerNext-Expanded+RNA Panel. The report date is 08/23/2024.  The CancerNext-Expanded gene panel offered by [facility] and includes sequencing, rearrangement, and RNA analysis for the following 77 genes: AIP, ALK, APC, ATM, AXIN2, BAP1, BARD1, BMPR1A, BRCA1, BRCA2, BRIP1, CDC73, CDH1, CDK4, CDKN1B, CDKN2A, CEBPA, CHEK2, CTNNA1, DDX41, DICER1, ETV6, FH, FLCN, GATA2, LZTR1, MAX, MBD4, MEN1, MET, MLH1, MSH2, MSH3, MSH6, MUTYH, NF1, NF2, NTHL1, PALB2, PHOX2B, PMS2, POT1, PRKAR1A, PTCH1, PTEN, RAD51C, RAD51D, RB1, RET,  RPS20, RUNX1, SDHA, SDHAF2, SDHB, SDHC, SDHD, SMAD4, SMARCA4, SMARCB1, SMARCE1, STK11, SUFU, TMEM127, TP53, TSC1, TSC2, VHL, and WT1 (sequencing and deletion/duplication); EGFR, HOXB13, KIT, MITF, PDGFRA, POLD1, and POLE (sequencing only); EPCAM and GREM1 (deletion/duplication only).    08/29/2024 - 08/29/2024 Chemotherapy   Patient is on Treatment Plan : ANUS Mitomycin  D1,29 + Capecitabine + XRT     Invasive carcinoma of breast (HCC)  06/12/2024 Imaging   CT chest abdomen pelvis with contrast showed Slight thickening in the area of the anal canal. Please correlate for history of neoplasm. There is a pathologic enlarged left inguinal node. There is a borderline right-sided node.    No additional areas of abnormal nodal enlargement or other aggressive appearing mass lesion at this time.   Fatty liver infiltration with known segment 7 hepatic hemangioma.   Of note there is a right-sided upper breast mass which was not clearly seen on prior CT scan. Please correlate for any prior study or if needed diagnostic mammographic evaluation and possible ultrasound when appropriate.   06/17/2024 Mammogram   1. There is a suspicious 17 mm mass at the site of CT and mammographic concern in the RIGHT breast. It demonstrates associated pleomorphic calcifications. Recommend ultrasound-guided biopsy for definitive characterization. 2. There is incidental sonographic note of a 7 mm mass in the RIGHT breast at 12 o'clock and a 6 mm non mass area at 9 o'clock. Recommend ultrasound-guided biopsy of these 2 areas for definitive characterization given suspicious appearance of the dominant mass. 3. No suspicious RIGHT axillary adenopathy. 4. No mammographic evidence of malignancy in the LEFT breast.   06/18/2024 Cancer Staging   Staging form: Breast, AJCC 8th Edition - Clinical stage from 06/18/2024: Stage IB (cT1b, cN0, cM0, G3, ER-, PR-, HER2-) - Signed by Babara Call, MD on 06/26/2024 Stage prefix: Initial diagnosis Histologic grading system: 3 grade system   06/18/2024 Initial Diagnosis   Invasive carcinoma of breast [facility])  Patient with right breast biopsy.  Pathology showed 1. Breast, right, needle core biopsy, 12:00 12cmfn (heart clip) :      INVASIVE DUCTAL CARCINOMA      TUBULE FORMATION: SCORE 3      NUCLEAR PLEOMORPHISM: SCORE 3      MITOTIC COUNT: SCORE 2      TOTAL SCORE: 8      OVERALL GRADE: 3      LYMPHOVASCULAR INVASION: NOT IDENTIFIED      CANCER LENGTH: 1.0 CM      CALCIFICATIONS: NOT IDENTIFIED      OTHER FINDINGS: NONE      ER negative, PR negative, HER2 negative (IHC 0) Ki-67 95%.       2. Breast, right, needle core biopsy, 12:00 8cmfn (coil clip) :       INVASIVE DUCTAL CARCINOMA      DUCTAL CARCINOMA IN SITU, SOLID, HIGH NUCLEAR GRADE WITH NECROSIS      TUBULE FORMATION: SCORE 3      NUCLEAR PLEOMORPHISM: SCORE 3      MITOTIC COUNT: SCORE 2      TOTAL SCORE: 8      OVERALL GRADE: 3      LYMPHOVASCULAR INVASION: NOT IDENTIFIED      CANCER LENGTH: 0.3 CM      CALCIFICATIONS: NOT IDENTIFIED      OTHER FINDINGS: NONE            3. Breast, right, needle core biopsy, 9:00 12cmfn (venus) :      BENIGN BREAST TISSUE WITH  DENSE STROMAL FIBROSIS.      NEGATIVE FOR ATYPIA OR MALIGNANCY.    07/08/2024 Surgery   Patient went right breast lumpectomy and sentinel lymph node biopsy.  1. 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      - DUCTAL CARCINOMA IN SITU (DCIS).      - SEE CANCER SUMMARY AND NOTE BELOW.      - TWO BIOPSY SITES WITH CORRESPONDING HEART AND COIL CLIPS.      - TWO SAVI SCOUT TAGS.       2. Lymph node, sentinel, biopsy, Right :      - MICROMETASTATIC CARCINOMA INVOLVES ONE OF FIVE LYMPH NODES (1MI/5).      - SEE CANCER SUMMARY AND NOTE BELOW.       3. Breast, excision, Right medial posterior margin :      - BENIGN BREAST TISSUE.      - NEGATIVE FOR ATYPIA AND MALIGNANCY.      - SEE CANCER SUMMARY BELOW.   TUMOR Histologic Type: Invasive carcinoma of no special type (ductal) Histologic Grade (Nottingham Histologic Score) Glandular (Acinar)/Tubular Differentiation: 3 Nuclear Pleomorphism: 3 Mitotic Rate: 3 Overall Grade: 3 Tumor Size: Greatest dimension of largest invasive focus: 19 mm Ductal Carcinoma In Situ (DCIS): Present, high-grade with comedonecrosis Lymphatic and/or Vascular Invasion: Present, extensive (LVI in 2 or more blocks)  Treatment Effect in the Breast: No known presurgical therapy MARGINS Margin Status for Invasive Carcinoma: Invasive Carcinoma involves inferior (unifocal) and superior (unifocal) margins and is 0.5 mm to anterior  margin Margin Status for DCIS: All margins negative for DCIS Distance from DCIS to closest margin: 0.5 mm Specify closest margin: Inferior REGIONAL LYMPH NODES Regional Lymph Node Status: Tumor present in regional lymph node(s) Number of Lymph Nodes with Macrometastases (greater than 2 mm): 0 Number of Lymph Nodes with Micrometastases (greater than 0.2 mm to 2 mm and/or greater than 200 cells): 1 Number of Lymph Nodes with Isolated Tumor Cells (0.2 mm or less OR 200 cells or less): 0 Size of Largest Metastatic Deposit: 1.9 mm Extranodal Extension: Not identified Total Number of Lymph Nodes Examined (sentinel and non-sentinel): 5 Number of Sentinel Nodes Examined: 5 DISTANT METASTASIS Distant Site(s) Involved, if applicable: Not applicable PATHOLOGIC STAGE CLASSIFICATION (pTNM, AJCC 8th Edition): Modified Classification: Not applicable pT Category: pT1c T Suffix: (m) multiple primary synchronous tumors in a single organ pN Category: pN75mi N Suffix: (sn) pM Category: Not applicable SPECIAL STUDIES Breast Biomarker Testing Performed on Previous Biopsy: [id number] (heart) Estrogen Receptor: Negative (0%) Progesterone Receptor: Negative (0%) HER2 IHC: Negative (0) Proliferation Marker Ki67: 95%   07/22/2024 Cancer Staging   Staging form: Breast, AJCC 8th Edition - Pathologic stage from 07/22/2024: Stage IB (pT1c, pN61mi(sn), cM0, G3, ER-, PR-, HER2-) - Signed by Babara Call, MD on 07/22/2024 Stage prefix: Initial diagnosis Method of lymph node assessment: Sentinel lymph node biopsy Multigene prognostic tests performed: None Histologic grading system: 3 grade system   07/25/2024 Surgery   Patient reports additional excision for positive margin.  1. Breast, excision, superior margin of right breast - RESIDUAL INVASIVE DUCTAL CARCINOMA, COMPLETELY EXCISED. - CARCINOMA PRESENT 2 MM FROM THE POSTERIOR MARGIN AND 4 MM FROM THE ANTERIOR AND SUPERIOR MARGINS. 2. Breast, excision, inferior  margin of right breast - RESIDUAL INVASIVE DUCTAL CARCINOMA, COMPLETELY EXCISED. - CARCINOMA PRESENT 1 MM FROM THE NEW INFERIOR MARGIN.   08/23/2024 Genetic Testing   Negative genetic testing.  No pathogenic variants identified on the [facility] CancerNext-Expanded+RNA Panel. The report date is 08/23/2024.  The CancerNext-Expanded gene panel offered by [facility] and includes sequencing, rearrangement, and RNA analysis for the following 77 genes: AIP, ALK, APC, ATM, AXIN2, BAP1, BARD1, BMPR1A, BRCA1, BRCA2, BRIP1, CDC73, CDH1, CDK4, CDKN1B, CDKN2A, CEBPA, CHEK2, CTNNA1, DDX41, DICER1, ETV6, FH, FLCN, GATA2, LZTR1, MAX, MBD4, MEN1, MET, MLH1, MSH2, MSH3, MSH6, MUTYH, NF1, NF2, NTHL1, PALB2, PHOX2B, PMS2, POT1, PRKAR1A, PTCH1, PTEN, RAD51C, RAD51D, RB1, RET, RPS20, RUNX1, SDHA, SDHAF2, SDHB, SDHC, SDHD, SMAD4, SMARCA4, SMARCB1, SMARCE1, STK11, SUFU, TMEM127, TP53, TSC1, TSC2, VHL, and WT1 (sequencing and deletion/duplication); EGFR, HOXB13, KIT, MITF, PDGFRA, POLD1, and POLE (sequencing only); EPCAM and GREM1 (deletion/duplication only).    10/23/2024 -  Chemotherapy   Patient is on Treatment Plan : BREAST DOSE DENSE AC q14d / PACLitaxel q7d      Discussed the use of AI scribe software for clinical note transcription with the patient, who gave verbal consent to proceed.   Patient has a history of VIN 3 status post vulvectomy  07/11/2024,she was evaluated by cardiology with palpitations and atypical chest pain. Chest pain deemed atypical and she went on to have a normal echo at 07/19/2024.   07/23/2024, patient was seen by symptom management for right neck pain.  She was seen by Tinnie Dawn.  Right upper extremity ultrasound was obtained which showed acute occlusive DVT involving the right internal jugular and subclavian veins.  Patient was started on Lovenox , she was supposed to be started on 1 mg/kg twice daily however was started off 40 mg twice daily which is a subtherapeutic dosage. She was asked to  hold off the evening dose prior to her breast re-excision.  07/29/2024, patient presented emergency room due to shortness of breath and chest pain.  CTA angiogram showed subsegmental PE medially in the left lower lobe.  Shortness of breath was felt to be multifactorial secondary to asthma/bronchitis, pulmonary embolism.  Symptoms are better after supportive care and she was discharged on Lovenox  80 mg subcutaneously twice daily.  08/31/2024 -09/06/2024 hospitalized due to sepsis due to multifocal pneumonia, UTI. Respiratory viral panel positive for metapneumovirus  Patient resumed on radiation on 09/16/24.  Patient took Xeloda twice daily and continued radiation till 09/19/2024.  She was not able to continue radiation on 09/20/2024 due to severe diarrhea.  09/20/2024 due to diarrhea  She has been experiencing excessive diarrhea for over a week, with significant worsening since 09/19/2024.SABRA The diarrhea is described as yellowish, slimy, orangey, and mucousy. She is unable to count the number of episodes and mentions having 'messed up' her bed multiple times. She has been taking Lomotil approximately three times a day as prescribed, but it has not been effective in alleviating her symptoms.  In addition to diarrhea, she has severe nausea that has persisted daily. She has been taking her prescribed nausea medication, Zofran , but continues to feel nauseous. She also mentions not sleeping well and feeling hungry.      MEDICAL HISTORY:  Past Medical History:  Diagnosis Date   Anemia    Anginal pain    Anxiety    Arthritis    Asthma    Cancer (HCC)    rectal cancer, breast cancer 2025   Chronic pain syndrome 05/21/2023   DDD (degenerative disc disease), cervical 05/22/2023   Deep venous thrombosis (HCC)    Depression    Dyspnea    Fibromyalgia    GERD (gastroesophageal reflux disease)    Headache  Heart murmur    History of hiatal hernia    History of kidney stones    Lupus    Malignant  neoplasm of upper-outer quadrant of right female breast, unspecified estrogen receptor status (HCC) 2025   Pneumonia    PTSD (post-traumatic stress disorder)    Skin candidiasis 05/29/2024   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    SURGICAL HISTORY: Past Surgical History:  Procedure Laterality Date   BREAST BIOPSY Right 06/18/2024   US  RT BREAST BX W LOC DEV 1ST LESION IMG BX SPEC US  GUIDE 06/18/2024 [facility]   BREAST BIOPSY Right 06/18/2024   US  RT BREAST BX W LOC DEV EA ADD LESION IMG BX SPEC US  GUIDE 06/18/2024 [facility]   BREAST BIOPSY Right 06/18/2024   US  RT BREAST BX W LOC DEV EA ADD LESION IMG BX SPEC US  GUIDE 06/18/2024 [facility]   BREAST BIOPSY Right 07/05/2024   US  RT BREAST SAVI/RF TAG 1ST LESION US  GUIDE 07/05/2024 [facility]   BREAST BIOPSY Right 07/05/2024   US  RT BREAST SAVI/RF TAG EA ADD'L LESION US  GUIDE 07/05/2024 [facility]   BREAST LUMPECTOMY Right 07/25/2024   Procedure: BREAST LUMPECTOMY;  Surgeon: Tye Millet, DO;  Location: [facility];  Service: General;  Laterality: Right;   BREAST LUMPECTOMY WITH RADIO FREQUENCY LOCALIZER Right 07/08/2024   Procedure: BREAST LUMPECTOMY WITH RADIO FREQUENCY LOCALIZER;  Surgeon: Tye Millet, DO;  Location: [facility];  Service: General;  Laterality: Right;   COLONOSCOPY WITH PROPOFOL  N/A 02/17/2023   Procedure: COLONOSCOPY WITH PROPOFOL ;  Surgeon: Maryruth Ole DASEN, MD;  Location: [facility];  Service: Endoscopy;  Laterality: N/A;   ESOPHAGOGASTRODUODENOSCOPY (EGD) WITH PROPOFOL  N/A 02/17/2023   Procedure: ESOPHAGOGASTRODUODENOSCOPY (EGD) WITH PROPOFOL ;  Surgeon: Maryruth Ole DASEN, MD;  Location: [facility];  Service: Endoscopy;  Laterality: N/A;   EXTRACORPOREAL SHOCK WAVE LITHOTRIPSY     IR CV LINE INJECTION  08/21/2024   IR REMOVAL TUN ACCESS W/ PORT W/O FL MOD SED  08/28/2024   PORTACATH PLACEMENT Right 07/08/2024   Procedure: INSERTION, TUNNELED CENTRAL VENOUS DEVICE, WITH  PORT;  Surgeon: Tye Millet, DO;  Location: [facility];  Service: General;  Laterality: Right;   RECTAL EXAM UNDER ANESTHESIA N/A 05/16/2024   Procedure: EXAM UNDER ANESTHESIA, RECTUM, INCISIONAL BIOPSY OF ANAL MASS;  Surgeon: Tye Millet, DO;  Location: [facility];  Service: General;  Laterality: N/A;   SENTINEL NODE BIOPSY Right 07/08/2024   Procedure: BIOPSY, LYMPH NODE, SENTINEL;  Surgeon: Tye Millet, DO;  Location: [facility];  Service: General;  Laterality: Right;   TONSILLECTOMY     TUBAL LIGATION     VULVECTOMY N/A 07/29/2020   Procedure: WIDE EXCISION VULVECTOMY;  Surgeon: Mancil Barter, MD;  Location: [facility];  Service: Gynecology;  Laterality: N/A;    SOCIAL HISTORY: Social History   Socioeconomic History   Marital status: Divorced    Spouse name: Not on file   Number of children: 1   Years of education: Not on file   Highest education level: Not on file  Occupational History   Not on file  Tobacco Use   Smoking status: Never   Smokeless tobacco: Never  Vaping Use   Vaping status: Never Used  Substance and Sexual Activity   Alcohol use: Not Currently   Drug use: No   Sexual activity: Yes  Other Topics Concern   Not on file  Social History Narrative   Not on file   Social Drivers of Health   Financial Resource Strain: Medium Risk (  05/15/2024)   Received from [facility]   Overall Financial Resource Strain (CARDIA)    Difficulty of Paying Living Expenses: Somewhat hard  Food Insecurity: No Food Insecurity (09/07/2024)   Hunger Vital Sign    Worried About Running Out of Food in the Last Year: Never true    Ran Out of Food in the Last Year: Never true  Recent Concern: Food Insecurity - Food Insecurity Present (08/22/2024)   Hunger Vital Sign    Worried About Running Out of Food in the Last Year: Sometimes true    Ran Out of Food in the Last Year: Sometimes true  Transportation Needs: No Transportation Needs (09/07/2024)   PRAPARE -  Administrator, Civil Service (Medical): No    Lack of Transportation (Non-Medical): No  Physical Activity: Not on file  Stress: Not on file  Social Connections: Not on file  Intimate Partner Violence: Not At Risk (09/07/2024)   Humiliation, Afraid, Rape, and Kick questionnaire    Fear of Current or Ex-Partner: No    Emotionally Abused: No    Physically Abused: No    Sexually Abused: No    FAMILY HISTORY: Family History  Problem Relation Age of Onset   Diabetes Mother    Congestive Heart Failure Mother    Colon cancer Father 50   Lupus Maternal Grandmother    Cancer Maternal Grandfather        either prostate or colon   Colon cancer Paternal Grandfather    Lung cancer Paternal Grandfather     ALLERGIES:  is allergic to prednisone and silicone.  MEDICATIONS:  Current Outpatient Medications  Medication Sig Dispense Refill   albuterol  (VENTOLIN  HFA) 108 (90 Base) MCG/ACT inhaler Inhale 2 puffs into the lungs every 4 (four) hours as needed for wheezing or shortness of breath. 8.5 g 2   ascorbic acid (VITAMIN C) 500 MG tablet Take 500 mg by mouth daily.     azelastine (ASTELIN) 0.1 % nasal spray Place 1 spray into both nostrils 2 (two) times daily. Use in each nostril as directed 30 mL 0   benzonatate  (TESSALON ) 200 MG capsule Take 1 capsule (200 mg total) by mouth 3 (three) times daily as needed for cough. 30 capsule 1   BIOTIN PO Take by mouth daily.     budesonide  (PULMICORT ) 0.25 MG/2ML nebulizer solution Take 2 mLs (0.25 mg total) by nebulization 2 (two) times daily. 60 mL 12   busPIRone  (BUSPAR ) 5 MG tablet Take 5 mg by mouth 2 (two) times daily.     cholecalciferol (VITAMIN D3) 25 MCG (1000 UNIT) tablet Take 1,000 Units by mouth daily.     dexamethasone  (DECADRON ) 4 MG tablet Take 2 tablets (8 mg total) by mouth daily for 2 days. Start the day after doxorubicin/cyclophosphamide chemotherapy. Take with food. 30 tablet 1   diphenoxylate-atropine (LOMOTIL) 2.5-0.025  MG tablet Take 1 tablet by mouth 4 (four) times daily. 60 tablet 0   DULoxetine  (CYMBALTA ) 30 MG capsule Take 30 mg by mouth at bedtime.     esomeprazole  (NEXIUM ) 40 MG capsule Take 1 capsule (40 mg total) by mouth 2 (two) times daily before a meal. 60 capsule 2   fentaNYL  (DURAGESIC ) 25 MCG/HR Place 1 patch onto the skin every 3 (three) days. 5 patch 0   gabapentin  (NEURONTIN ) 300 MG capsule Take 600 mg by mouth 2 (two) times daily.     HYDROcodone  bit-homatropine (HYCODAN) 5-1.5 MG/5ML syrup Take 5 mLs by mouth every  6 (six) hours as needed for cough. 120 mL 0   hydrocortisone (ANUSOL-HC) 2.5 % rectal cream Place 1 Application rectally 2 (two) times daily. 90 g 2   ipratropium-albuterol  (DUONEB) 0.5-2.5 (3) MG/3ML SOLN Take 3 mLs by nebulization 4 (four) times daily. 360 mL 3   lidocaine  (LIDODERM ) 5 % 1 patch daily.     linaclotide (LINZESS) 145 MCG CAPS capsule Take 145 mcg by mouth daily before breakfast.     LORazepam (ATIVAN) 0.5 MG tablet Take 1 tablet (0.5 mg total) by mouth every 12 (twelve) hours as needed for anxiety (nausea). 30 tablet 0   midodrine (PROAMATINE) 10 MG tablet Take 1 tablet (10 mg total) by mouth 3 (three) times daily with meals. 90 tablet 0   montelukast  (SINGULAIR ) 10 MG tablet Take 1 tablet (10 mg total) by mouth See admin instructions. Take 1 tablet on the day prior to chemotherapy and take 1 tablet daily for 2 days after chemotherapy. 30 tablet 0   Multiple Vitamin (MULTIVITAMIN WITH MINERALS) TABS tablet Take 1 tablet by mouth daily.     nystatin  (MYCOSTATIN ) 100000 UNIT/ML suspension Take 5 mLs (500,000 Units total) by mouth 4 (four) times daily. Swish and swallow 473 mL 1   OLANZapine (ZYPREXA) 5 MG tablet Take 1 tablet (5 mg total) by mouth at bedtime. 30 tablet 3   oxyCODONE  (OXY IR/ROXICODONE ) 5 MG immediate release tablet Take 1 tablet (5 mg total) by mouth every 6 (six) hours as needed for severe pain (pain score 7-10) or breakthrough pain. 30 tablet 0    prochlorperazine  (COMPAZINE ) 10 MG tablet Take 1 tablet (10 mg total) by mouth every 6 (six) hours as needed for nausea or vomiting. 30 tablet 1   promethazine  (PHENERGAN ) 25 MG tablet Take 1 tablet (25 mg total) by mouth every 8 (eight) hours as needed for nausea or vomiting. 60 tablet 0   Turmeric (QC TUMERIC COMPLEX PO) Take by mouth daily.     capecitabine (XELODA) 500 MG tablet Take 3 tablets (1,500 mg total) by mouth 2 (two) times daily after a meal. Take within 30 minutes after meals. Take only on days when you have radiation. Start on 08/29/24. (Patient not taking: Reported on 09/23/2024) 60 tablet 0   enoxaparin  (LOVENOX ) 80 MG/0.8ML injection Inject 0.8 mLs (80 mg total) into the skin every 12 (twelve) hours. 48 mL 2   potassium chloride  SA (KLOR-CON  M) 20 MEQ tablet Take 1 tablet (20 mEq total) by mouth 2 (two) times daily. 30 tablet 0   No current facility-administered medications for this visit.   Facility-Administered Medications Ordered in Other Visits  Medication Dose Route Frequency Provider Last Rate Last Admin   potassium chloride  20 mEq in 100 mL IVPB  20 mEq Intravenous Once Babara Call, MD        Review of Systems  Constitutional:  Positive for fatigue. Negative for chills and fever.  HENT:   Negative for hearing loss and voice change.        Nasal congestion  Eyes:  Negative for eye problems.  Respiratory:  Negative for chest tightness, cough and shortness of breath.   Cardiovascular:  Negative for chest pain.  Gastrointestinal:  Positive for diarrhea and nausea. Negative for abdominal distention, abdominal pain and blood in stool.       Anal mass with drainage  Endocrine: Negative for hot flashes.  Genitourinary:  Negative for difficulty urinating and frequency.   Musculoskeletal:  Negative for arthralgias.  Skin:  Negative  for itching and rash.  Neurological:  Negative for extremity weakness.  Hematological:  Negative for adenopathy.  Psychiatric/Behavioral:   Negative for confusion.      PHYSICAL EXAMINATION: ECOG PERFORMANCE STATUS: 0 - Asymptomatic  Vitals:   09/23/24 1414  BP: 117/73  Pulse: (!) 105  Resp: 18  Temp: (!) 96.9 F (36.1 C)  SpO2: 100%   Filed Weights   09/23/24 1414  Weight: 186 lb (84.4 kg)    Physical Exam Constitutional:      General: She is not in acute distress.    Appearance: She is not diaphoretic.  HENT:     Head: Normocephalic and atraumatic.     Mouth/Throat:     Pharynx: No oropharyngeal exudate.  Eyes:     General: No scleral icterus. Cardiovascular:     Rate and Rhythm: Normal rate and regular rhythm.     Heart sounds: No murmur heard. Pulmonary:     Effort: Pulmonary effort is normal. No respiratory distress.     Breath sounds: No wheezing.  Abdominal:     General: There is no distension.     Palpations: Abdomen is soft.     Tenderness: There is no abdominal tenderness.  Genitourinary:    Comments: anal mass previously 5cm, decreased in size Musculoskeletal:        General: Normal range of motion.     Cervical back: Normal range of motion and neck supple.  Skin:    General: Skin is warm and dry.     Findings: No erythema.  Neurological:     Mental Status: She is alert and oriented to person, place, and time. Mental status is at baseline.     Motor: No abnormal muscle tone.  Psychiatric:        Mood and Affect: Affect normal.         LABORATORY DATA:  I have reviewed the data as listed    Latest Ref Rng & Units 09/23/2024    2:04 PM 09/17/2024    3:17 PM 09/13/2024    5:00 AM  CBC  WBC 4.0 - 10.5 K/uL 5.0  4.6  2.3   Hemoglobin 12.0 - 15.0 g/dL 88.9  89.9  8.0   Hematocrit 36.0 - 46.0 % 33.8  31.7  25.6   Platelets 150 - 400 K/uL 322  285  250       Latest Ref Rng & Units 09/23/2024    2:04 PM 09/17/2024    3:17 PM 09/12/2024    3:26 AM  CMP  Glucose 70 - 99 mg/dL 890  94  897   BUN 6 - 20 mg/dL 5  6  <5   Creatinine 9.55 - 1.00 mg/dL 9.28  9.29  9.53   Sodium  135 - 145 mmol/L 135  136  142   Potassium 3.5 - 5.1 mmol/L 3.1  3.7  4.4   Chloride 98 - 111 mmol/L 105  103  101   CO2 22 - 32 mmol/L 23  24  31    Calcium 8.9 - 10.3 mg/dL 8.6  8.5  8.6   Total Protein 6.5 - 8.1 g/dL 6.3  6.3    Total Bilirubin 0.0 - 1.2 mg/dL 0.7  0.8    Alkaline Phos 38 - 126 U/L 80  90    AST 15 - 41 U/L 75  36    ALT 0 - 44 U/L 44  18       RADIOGRAPHIC STUDIES: I have personally reviewed  the radiological images as listed and agreed with the findings in the report. DG Chest 2 View Result Date: 09/10/2024 EXAM: 2 VIEW(S) XRAY OF THE CHEST 09/10/2024 08:42:00 AM COMPARISON: 09/06/2024 CLINICAL HISTORY: Recurrent fever, pneumonia FINDINGS: LINES, TUBES AND DEVICES: Left upper extremity PICC in place with tip at cavoatrial junction. LUNGS AND PLEURA: Low lung volumes. Small right pleural effusion. Bibasilar patchy airspace opacities. HEART AND MEDIASTINUM: No acute abnormality of the cardiac and mediastinal silhouettes. BONES AND SOFT TISSUES: No acute osseous abnormality. IMPRESSION: 1. Diffuse interstitial prominence and bibasilar patchy airspace opacities, suspicious for pneumonia. 2. Small right pleural effusion. Electronically signed by: Oneil Devonshire MD 09/10/2024 09:42 AM EDT RP Workstation: [id number]   CT Chest Wo Contrast Result Date: 09/07/2024 EXAM: CT CHEST WITHOUT CONTRAST 09/07/2024 03:02:30 AM TECHNIQUE: CT of the chest was performed without the administration of intravenous contrast. Multiplanar reformatted images are provided for review. Automated exposure control, iterative reconstruction, and/or weight based adjustment of the mA/kV was utilized to reduce the radiation dose to as low as reasonably achievable. COMPARISON: Chest radiograph earlier today. CLINICAL HISTORY: Respiratory illness, nondiagnostic xray. Pt complaints of shortness of breath and chest tightness. Pt reports she was discharged from the hospital this morning. Pt reports she is not able to  carry on a conversations without feeling short of breath. Pt reports she was admitted to hospital due to sepsis. FINDINGS: MEDIASTINUM: Trace pericardial effusion. Left arm PICC terminates in the mid SVC. The central airways are clear. LYMPH NODES: Subcentimeter mediastinal lymph nodes, likely reactive. No hilar or axillary lymphadenopathy. LUNGS AND PLEURA: Multifocal patchy right lung opacities, right lower lobe predominant, favoring multifocal pneumonia. Mild left basilar opacity, atelectasis versus pneumonia. Small bilateral pleural effusions. No pneumothorax. SOFT TISSUES/BONES: No acute abnormality of the bones or soft tissues. UPPER ABDOMEN: Limited images of the upper abdomen demonstrate mild hepatic steatosis. IMPRESSION: 1. Multifocal patchy right lung opacities, right lower lobe predominant, favoring multifocal pneumonia. 2. Mild left basilar opacity, atelectasis versus pneumonia. 3. Small bilateral pleural effusions. Electronically signed by: Pinkie Pebbles MD 09/07/2024 03:05 AM EDT RP Workstation: [id number]   DG Chest 1 View Result Date: 09/07/2024 EXAM: 1 VIEW(S) XRAY OF THE CHEST 09/07/2024 12:01:00 AM COMPARISON: 09/03/2024 CLINICAL HISTORY: 89973 Shortness of breath 10026. Pt presents to ER from home with complaints of shortness of breath and chest tightness. Pt reports she was discharged from the hospital this morning. Pt reports she is not able to carry on a conversations without feeling short of breath. Pt reports she ; was admitted to hospital due to sepsis. Pt also reports she thinks her PICC line is infected. Pt presents to ER from home with complaints of shortness of breath and chest tightness. Pt reports she was discharged from the hospital this morning. Pt reports she is not able to carry on a conversations without feeling short of breath. Pt reports she ; was admitted to hospital due to sepsis. Pt also reports she thinks her PICC line is infected. FINDINGS: LINES, TUBES AND  DEVICES: Stable left arm PICC at the cavoatrial junction. LUNGS AND PLEURA: New patchy right lower lobe opacity, favoring pneumonia over atelectasis. No pulmonary edema. No pleural effusion. No pneumothorax. HEART AND MEDIASTINUM: No acute abnormality of the cardiac and mediastinal silhouettes. BONES AND SOFT TISSUES: No acute osseous abnormality. IMPRESSION: 1. New patchy right lower lobe opacity, favoring pneumonia over atelectasis. Electronically signed by: Pinkie Pebbles MD 09/07/2024 12:05 AM EDT RP Workstation: [id number]   US  Venous Img Lower Unilateral Right (DVT)  Result Date: 09/05/2024 CLINICAL DATA:  Right lower extremity pain, edema and warmth. History of prior DVT. EXAM: RIGHT LOWER EXTREMITY VENOUS DOPPLER ULTRASOUND TECHNIQUE: Gray-scale sonography with graded compression, as well as color Doppler and duplex ultrasound were performed to evaluate the lower extremity deep venous systems from the level of the common femoral vein and including the common femoral, femoral, profunda femoral, popliteal and calf veins including the posterior tibial, peroneal and gastrocnemius veins when visible. The superficial great saphenous vein was also interrogated. Spectral Doppler was utilized to evaluate flow at rest and with distal augmentation maneuvers in the common femoral, femoral and popliteal veins. COMPARISON:  None Available. FINDINGS: Contralateral Common Femoral Vein: Respiratory phasicity is normal and symmetric with the symptomatic side. No evidence of thrombus. Normal compressibility. Common Femoral Vein: No evidence of thrombus. Normal compressibility, respiratory phasicity and response to augmentation. Saphenofemoral Junction: No evidence of thrombus. Normal compressibility and flow on color Doppler imaging. Profunda Femoral Vein: No evidence of thrombus. Normal compressibility and flow on color Doppler imaging. Femoral Vein: No evidence of thrombus. Normal compressibility, respiratory phasicity  and response to augmentation. Popliteal Vein: No evidence of thrombus. Normal compressibility, respiratory phasicity and response to augmentation. Calf Veins: No evidence of thrombus. Normal compressibility and flow on color Doppler imaging. Superficial Great Saphenous Vein: No evidence of thrombus. Normal compressibility. Venous Reflux:  None. Other Findings: No evidence of superficial thrombophlebitis or abnormal fluid collection. IMPRESSION: No evidence of right lower extremity deep venous thrombosis. Electronically Signed   By: Marcey Moan M.D.   On: 09/05/2024 07:49   CT ABDOMEN PELVIS W CONTRAST Result Date: 09/03/2024 CLINICAL DATA:  Urinary tract infection, right breast cancer EXAM: CT ABDOMEN AND PELVIS WITH CONTRAST TECHNIQUE: Multidetector CT imaging of the abdomen and pelvis was performed using the standard protocol following bolus administration of intravenous contrast. RADIATION DOSE REDUCTION: This exam was performed according to the departmental dose-optimization program which includes automated exposure control, adjustment of the mA and/or kV according to patient size and/or use of iterative reconstruction technique. CONTRAST:  OMNIPAQUE  IOHEXOL  300 MG/ML  SOLN COMPARISON:  08/17/2024 FINDINGS: Lower chest: Right lower lobe airspace disease consistent with pneumonia. Trace right effusion. No pneumothorax. Skin thickening within the right breast likely post therapeutic in this patient with a known prior history of right breast cancer and lumpectomy. Hepatobiliary: No focal liver abnormality is seen. No gallstones, gallbladder wall thickening, or biliary dilatation. Pancreas: Unremarkable. No pancreatic ductal dilatation or surrounding inflammatory changes. Spleen: Normal in size without focal abnormality. Adrenals/Urinary Tract: Adrenal glands are unremarkable. Kidneys are normal, without renal calculi, focal lesion, or hydronephrosis. Bladder is unremarkable. Stomach/Bowel: No bowel  obstruction or ileus. Moderate stool within the colon consistent with constipation. Normal appendix right lower quadrant. No bowel wall thickening or inflammatory change. Vascular/Lymphatic: No significant vascular findings are present. No enlarged abdominal or pelvic lymph nodes. Reproductive: Uterus and bilateral adnexa are unremarkable. Other: Trace pelvic free fluid, nonspecific. No free intraperitoneal gas. No abdominal wall hernia. Musculoskeletal: Sequela of multiple subcutaneous injections within the anterior abdominal wall. No acute or destructive bony abnormalities. Reconstructed images demonstrate no additional findings. IMPRESSION: 1. Right lower lobe pneumonia. 2. Trace pelvic free fluid, likely physiologic. 3. Moderate retained stool throughout the colon consistent with constipation. 4. Likely post therapeutic skin thickening within the right breast in this patient with a known history of right breast cancer. Electronically Signed   By: Ozell Daring M.D.   On: 09/03/2024 20:39   DG Chest 1  View Result Date: 09/03/2024 CLINICAL DATA:  Pneumonia. EXAM: DG CHEST 1V COMPARISON:  08/31/2024 FINDINGS: Left-sided PICC line unchanged with tip over the SVC. Lungs are hypoinflated with minimal stable prominence of the central pulmonary vasculature. No acute consolidation or effusion. Cardiomediastinal silhouette and remainder the exam is unchanged. IMPRESSION: Hypoinflation without acute cardiopulmonary disease. Electronically Signed   By: Toribio Agreste M.D.   On: 09/03/2024 08:54   DG Chest Port 1 View Result Date: 08/31/2024 EXAM: 1 VIEW XRAY OF THE CHEST 08/31/2024 07:31:10 PM COMPARISON: 08/13/2024 CLINICAL HISTORY: Questionable sepsis - evaluate for abnormality. Appears to be some bleeding around the site; patient is concerned that dressing may need to be changed, states site did not appear that way when she was last scene at [facility] on Thursday. Ambulatory to triage, NAD noted at this time.  FINDINGS: LINES, TUBES AND DEVICES: Interval removal of a right chest wall port-a-cath. Interval placement of a left PICC with tip overlying the expected region of the distal superior vena cava. LUNGS AND PLEURA: Slightly low lung volumes. No focal pulmonary opacity. No pulmonary edema. No pleural effusion. No pneumothorax. HEART AND MEDIASTINUM: Slightly prominent cardiac silhouette likely due to AP portable technique and low lung volumes . No acute abnormality of the mediastinal silhouette. BONES AND SOFT TISSUES: No acute osseous abnormality. IMPRESSION: 1. Low lung volumes with no definite acute cardiopulmonary process. 2. Apparent cardiomegaly on AP portable; consider repeat PA and lateral chest radiographs for confirmation. Electronically signed by: Morgane Naveau MD 08/31/2024 07:34 PM EDT RP Workstation: [id number]   IR REMOVAL TUN ACCESS W/ PORT W/O FL MOD SED Result Date: 08/28/2024 INDICATION: 48 year old with malpositioned right jugular Port-A-Cath and thrombus in the right internal jugular vein. Patient needs removal of the Port-A-Cath and placement of a PICC line for treatment of anal cancer. EXAM: 1.  PICC LINE PLACEMENT WITH ULTRASOUND AND FLUOROSCOPIC GUIDANCE 2.  REMOVAL OF RIGHT CHEST PORT-A-CATH MEDICATIONS: No sedation ANESTHESIA/SEDATION: No sedation FLUOROSCOPY: Radiation Exposure Index (as provided by the fluoroscopic device): 8 mGy Kerma COMPLICATIONS: None immediate. PROCEDURE: The patient was advised of the possible risks and complications and agreed to undergo the procedure. The patient was then brought to the angiographic suite for the procedure. The left arm was prepped with chlorhexidine , draped in the usual sterile fashion using maximum barrier technique (cap and mask, sterile gown, sterile gloves, large sterile sheet, hand hygiene and cutaneous antiseptic). Local anesthesia was attained by infiltration with 1% lidocaine . Ultrasound demonstrated patency of the left basilic vein,  and this was documented with an image. Under real-time ultrasound guidance, this vein was accessed with a 21 gauge micropuncture needle and image documentation was performed. The needle was exchanged over a guidewire for a peel-away sheath through which a 39 cm 5 French single lumen power injectable PICC was advanced, and positioned with its tip at the lower SVC/right atrial junction. Fluoroscopy during the procedure and fluoro spot radiograph confirms appropriate catheter position. The catheter was flushed, secured to the skin, and covered with a sterile dressing. The right chest was prepped and draped in a sterile fashion. 1% lidocaine  with epinephrine  was utilized for local anesthesia. An incision was made over the previously healed surgical incision. Utilizing blunt dissection, the port catheter and reservoir were removed from the underlying subcutaneous tissue in their entirety. Retention sutures were removed. The pocket was irrigated with a copious amount of sterile normal saline. The subcutaneous tissue was closed with 3-0 Vicryl interrupted subcutaneous stitches. A 4-0 Vicryl running subcuticular  stitch was utilized to approximate the skin. Dermabond was applied. Dressing was placed. Fluoroscopic image was obtained after removal of the Port-A-Cath. IMPRESSION: Successful placement of a left arm PICC line with sonographic and fluoroscopic guidance. The catheter is ready for use. Successful removal of the right chest Port-A-Cath. Electronically Signed   By: Juliene Balder M.D.   On: 08/28/2024 16:45   IR PICC PLACEMENT LEFT >5 YRS INC IMG GUIDE Result Date: 08/28/2024 INDICATION: 48 year old with malpositioned right jugular Port-A-Cath and thrombus in the right internal jugular vein. Patient needs removal of the Port-A-Cath and placement of a PICC line for treatment of anal cancer. EXAM: 1.  PICC LINE PLACEMENT WITH ULTRASOUND AND FLUOROSCOPIC GUIDANCE 2.  REMOVAL OF RIGHT CHEST PORT-A-CATH MEDICATIONS: No  sedation ANESTHESIA/SEDATION: No sedation FLUOROSCOPY: Radiation Exposure Index (as provided by the fluoroscopic device): 8 mGy Kerma COMPLICATIONS: None immediate. PROCEDURE: The patient was advised of the possible risks and complications and agreed to undergo the procedure. The patient was then brought to the angiographic suite for the procedure. The left arm was prepped with chlorhexidine , draped in the usual sterile fashion using maximum barrier technique (cap and mask, sterile gown, sterile gloves, large sterile sheet, hand hygiene and cutaneous antiseptic). Local anesthesia was attained by infiltration with 1% lidocaine . Ultrasound demonstrated patency of the left basilic vein, and this was documented with an image. Under real-time ultrasound guidance, this vein was accessed with a 21 gauge micropuncture needle and image documentation was performed. The needle was exchanged over a guidewire for a peel-away sheath through which a 39 cm 5 French single lumen power injectable PICC was advanced, and positioned with its tip at the lower SVC/right atrial junction. Fluoroscopy during the procedure and fluoro spot radiograph confirms appropriate catheter position. The catheter was flushed, secured to the skin, and covered with a sterile dressing. The right chest was prepped and draped in a sterile fashion. 1% lidocaine  with epinephrine  was utilized for local anesthesia. An incision was made over the previously healed surgical incision. Utilizing blunt dissection, the port catheter and reservoir were removed from the underlying subcutaneous tissue in their entirety. Retention sutures were removed. The pocket was irrigated with a copious amount of sterile normal saline. The subcutaneous tissue was closed with 3-0 Vicryl interrupted subcutaneous stitches. A 4-0 Vicryl running subcuticular stitch was utilized to approximate the skin. Dermabond was applied. Dressing was placed. Fluoroscopic image was obtained after removal  of the Port-A-Cath. IMPRESSION: Successful placement of a left arm PICC line with sonographic and fluoroscopic guidance. The catheter is ready for use. Successful removal of the right chest Port-A-Cath. Electronically Signed   By: Juliene Balder M.D.   On: 08/28/2024 16:45

## 2024-09-24 ENCOUNTER — Ambulatory Visit
Admission: RE | Admit: 2024-09-24 | Discharge: 2024-09-24 | Disposition: A | Discharge status: Home / Self Care | Source: Ambulatory Visit | Attending: Radiation Oncology | Admitting: Radiation Oncology

## 2024-09-24 ENCOUNTER — Other Ambulatory Visit: Payer: Self-pay

## 2024-09-24 ENCOUNTER — Ambulatory Visit

## 2024-09-24 LAB — RAD ONC ARIA SESSION SUMMARY
Course Elapsed Days: 54
Plan Fractions Treated to Date: 30
Plan Prescribed Dose Per Fraction: 1.8 Gy
Plan Total Fractions Prescribed: 30
Plan Total Prescribed Dose: 54 Gy
Reference Point Dosage Given to Date: 54 Gy
Reference Point Session Dosage Given: 1.8 Gy
Session Number: 30

## 2024-09-25 ENCOUNTER — Inpatient Hospital Stay

## 2024-09-25 ENCOUNTER — Inpatient Hospital Stay: Admitting: Licensed Clinical Social Worker

## 2024-09-25 NOTE — Progress Notes (Signed)
 [facility] CSW Progress Note  Clinical Child Psychotherapist contacted patient by phone to follow-up on financial concerns.    Interventions: Patient reports feeling tired today.  She has received assistance from two breast cancer grants.  CSW to follow up with other two agencies to determine status of her applications.      Follow Up Plan:  CSW will follow-up with patient by phone     Monica Gonzalez Au, LCSW Clinical Social Worker [facility]    Patient is participating in a Managed Medicaid Plan:  Yes

## 2024-09-25 NOTE — Radiation Completion Notes (Signed)
 Patient Name: Monica Gonzalez, Monica Gonzalez MRN: [id number] Date of Birth: 11-Aug-1976 Referring Physician: [name], M.D. Date of Service: 2024-09-25 Radiation Oncologist: Marcey Penton, M.D. 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  She was seen by surgery and underwent an anal biopsy which was positive for invasive moderately differentiated squamous cell carcinoma.  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  CT scan of chest abdomen pelvis showed thickening area of the anal canal and pathologically enlarged left inguinal node.  She had borderline right sided inguinal adenopathy.  Of note there was a right sided upper breast mass noted and she is undergoing today biopsy of her inguinal nodes as well as referral to surgeon for evaluation of her breast mass.  She did have ultrasound-guided biopsy yesterday of her right breast mass showing invasive ductal carcinoma overall grade 3.  She is seen today for radiation oncology evaluation.  She still somewhat sore from her breast biopsy.  Her bowel movements tend towards diarrhea she states for the past month they are fairly watery.  No increased lower urinary tract symptoms.      ==========DELIVERED PLANS==========  First Treatment Date: 2024-08-01 Last Treatment Date: 2024-09-24   Plan Name: Anus Site: Anus Technique: IMRT Mode: Photon Dose Per Fraction: 1.8 Gy Prescribed Dose (Delivered / Prescribed): 54 Gy / 54 Gy Prescribed  Fxs (Delivered / Prescribed): 30 / 30     ==========ON TREATMENT VISIT DATES========== 2024-08-06, 2024-08-13, 2024-08-20, 2024-08-27, 2024-09-10, 2024-09-17, 2024-09-24     ==========UPCOMING VISITS========== 10/30/2024 [facility] RAD ONCOLOGY FOLLOW UP 30 Penton Marcey, MD  10/25/2024 [facility] ONC INJECTION [facility]-MO INJECTION  10/23/2024 [facility] ON[facility] INFUSION [facility] MO INFUSION CHAIR 23  10/23/2024 [facility] ONC EST PT Cap Zelphia, MD  10/23/2024 [facility] INF PORT FLUSH W/LAB [facility]-PORT FLUSH  10/14/2024 [facility] INFUSION [facility]- MO INFUSION CHAIR 15  10/07/2024 [facility] INFUSION [facility] MO INFUSION CHAIR 10  10/04/2024 [facility] ONC NUT 45 Dasie Simple, RD  09/30/2024 [facility] ONC FLUID CLINIC INFUSION [facility]-FLUID CLINIC 3  09/30/2024 [facility] ON[facility] EST PT Cap Zelphia, MD  09/30/2024 [facility] ON[facility] INF PORT FLUSH W/LAB [facility]FLUID CLINIC 3  09/30/2024 [facility] OFFICE VISIT - PULM Malachy Comer GAILS, NP  09/25/2024 [facility] Duffy, Macario PEDLAR, LCSW        ==========APPENDIX - ON TREATMENT VISIT NOTES==========   See weekly On Treatment Notes in Epic for details in the Media tab (listed as Progress notes on the On Treatment Visit Dates listed above).

## 2024-09-27 ENCOUNTER — Inpatient Hospital Stay

## 2024-09-27 ENCOUNTER — Encounter: Payer: Self-pay | Admitting: Oncology

## 2024-09-30 ENCOUNTER — Telehealth: Payer: Self-pay | Admitting: *Deleted

## 2024-09-30 ENCOUNTER — Inpatient Hospital Stay

## 2024-09-30 ENCOUNTER — Ambulatory Visit: Admitting: Nurse Practitioner

## 2024-09-30 ENCOUNTER — Encounter: Payer: Self-pay | Admitting: Nurse Practitioner

## 2024-09-30 ENCOUNTER — Inpatient Hospital Stay: Admitting: Oncology

## 2024-09-30 ENCOUNTER — Encounter: Payer: Self-pay | Admitting: Oncology

## 2024-09-30 VITALS — BP 106/69 | HR 98 | Temp 97.6°F | Resp 16 | Wt 184.0 lb

## 2024-09-30 DIAGNOSIS — I2699 Other pulmonary embolism without acute cor pulmonale: Secondary | ICD-10-CM | POA: Diagnosis not present

## 2024-09-30 DIAGNOSIS — K521 Toxic gastroenteritis and colitis: Secondary | ICD-10-CM

## 2024-09-30 DIAGNOSIS — K627 Radiation proctitis: Secondary | ICD-10-CM

## 2024-09-30 DIAGNOSIS — T451X5A Adverse effect of antineoplastic and immunosuppressive drugs, initial encounter: Secondary | ICD-10-CM

## 2024-09-30 DIAGNOSIS — I82C11 Acute embolism and thrombosis of right internal jugular vein: Secondary | ICD-10-CM

## 2024-09-30 DIAGNOSIS — C210 Malignant neoplasm of anus, unspecified: Secondary | ICD-10-CM | POA: Diagnosis not present

## 2024-09-30 DIAGNOSIS — C50919 Malignant neoplasm of unspecified site of unspecified female breast: Secondary | ICD-10-CM

## 2024-09-30 DIAGNOSIS — R197 Diarrhea, unspecified: Secondary | ICD-10-CM

## 2024-09-30 DIAGNOSIS — R7989 Other specified abnormal findings of blood chemistry: Secondary | ICD-10-CM

## 2024-09-30 DIAGNOSIS — R110 Nausea: Secondary | ICD-10-CM

## 2024-09-30 DIAGNOSIS — E876 Hypokalemia: Secondary | ICD-10-CM

## 2024-09-30 LAB — CMP (CANCER CENTER ONLY)
ALT: 85 U/L — ABNORMAL HIGH (ref 0–44)
AST: 85 U/L — ABNORMAL HIGH (ref 15–41)
Albumin: 3.1 g/dL — ABNORMAL LOW (ref 3.5–5.0)
Alkaline Phosphatase: 82 U/L (ref 38–126)
Anion gap: 10 (ref 5–15)
BUN: 8 mg/dL (ref 6–20)
CO2: 21 mmol/L — ABNORMAL LOW (ref 22–32)
Calcium: 8.8 mg/dL — ABNORMAL LOW (ref 8.9–10.3)
Chloride: 106 mmol/L (ref 98–111)
Creatinine: 0.82 mg/dL (ref 0.44–1.00)
GFR, Estimated: >60 mL/min (ref >60)
Glucose, Bld: 140 mg/dL — ABNORMAL HIGH (ref 70–99)
Potassium: 3.7 mmol/L (ref 3.5–5.1)
Sodium: 137 mmol/L (ref 135–145)
Total Bilirubin: 1 mg/dL (ref 0.0–1.2)
Total Protein: 6.2 g/dL — ABNORMAL LOW (ref 6.5–8.1)

## 2024-09-30 LAB — MAGNESIUM: Magnesium: 1.8 mg/dL (ref 1.7–2.4)

## 2024-09-30 MED ORDER — FENTANYL 25 MCG/HR TD PT72: 1.0000 | MEDICATED_PATCH | TRANSDERMAL | 0 refills | Status: DC

## 2024-09-30 MED ORDER — SODIUM CHLORIDE 0.9 % IV SOLN
Freq: Once | INTRAVENOUS | Status: AC
  Filled 2024-09-30: qty 250

## 2024-09-30 MED ORDER — OXYCODONE HCL 5 MG PO TABS: 5.0000 mg | ORAL_TABLET | Freq: Four times a day (QID) | ORAL | 0 refills | Status: DC | PRN

## 2024-09-30 NOTE — Assessment & Plan Note (Signed)
 Secondary to chemotherapy and radiation.  Recommend Lomotil 1 tablet p.o. 4 times daily

## 2024-09-30 NOTE — Assessment & Plan Note (Signed)
 Continue Lovenox 1 mg/kg twice daily

## 2024-09-30 NOTE — Assessment & Plan Note (Signed)
 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 Continue Lovenox  1 mg/kg twice daily-80 mg every 12 hours.  anti Xa level was  in therapeutic/supratherapeutic range.

## 2024-09-30 NOTE — Assessment & Plan Note (Addendum)
 Anusol rectal cream topically Continue Oxycodone  5mg  Q6h PRN and Fetanyl patch 25mcg Q3 days for pain control. She has finished treatments and I expect symptoms to gradual improve.

## 2024-09-30 NOTE — Progress Notes (Signed)
 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  Per IR, right inguinal lymph node is not enlarged.  Previously on concurrent chemotherapy 5-FU and mitomycin  C with Radiation. S/p D1 5-FU and Mitomycin -C. D29 5-FU infusion was switched to concurrent Xeloda due to lack of medi port.- she has picc line.  Received D29 Mitomycin  C. Chemo was interrupted due to multi focal pneumonia.  Finally finished concurrent Xeloda with Radiation.  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 mT1c pN58mi Initial margin was positive and patient underwent reexcision and achieved a negative margin.  Patient's case is complicated as evidenced by 2 cancer primaries.  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 Finished treatment for Stage III Anal cancer.  Once she recovered from recent chemo and radiation. Plan adjuvant ddAC followed by weekly Taxol. Obtain MUGA/Echo  Acute subsegmental pulmonary embolism (HCC) Continue Lovenox  1 mg/kg twice daily.  Chemotherapy induced diarrhea Secondary to chemotherapy and radiation.  Recommend Lomotil 1 tablet p.o. 4 times daily   Chemotherapy-induced nausea Continue home antiemetics as needed.  Added lorazepam 0.5 every 12 hours as needed anxiety and nausea.   Patient will get IV fluid sessions today,  Elevated LFTs Multifactorial, possible secondary to chemotherapy, fatty liver disease.  Bilirubin level is normal. Recommend patient to  hold off using Tylenol .     Hypokalemia Continue potassium 20 mEq twice daily.    Internal jugular vein thrombosis, right (HCC) acute occlusive DVT involving the right internal jugular and subclavian veins. Continue Lovenox  1 mg/kg twice daily-80 mg every 12 hours.  anti Xa level was  in therapeutic/supratherapeutic range.     Radiation proctitis Anusol rectal cream topically Continue Oxycodone  5mg  Q6h PRN and Fetanyl patch 25mcg Q3 days for pain control. She has finished treatments and I expect symptoms to gradual improve.      No orders of the defined types were placed in this encounter.  Follow-up per LOS  All questions were answered. The patient knows to call the clinic with any problems, questions or concerns.  [name], MD, PhD [facility] Hematology Oncology 09/30/2024    HISTORY OF PRESENTING ILLNESS:  Monica Gonzalez 48 y.o. 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 Summary of oncologic history is as follows: Oncology History  Anal squamous cell carcinoma (HCC)  05/29/2024 Initial Diagnosis   Anal squamous cell carcinoma   Patient has noticed a hard mass at the anal area which she initially thought was a hemorrhoid.  The mass has been present for more than a month and appears to be enlarging. Patient was referred to establish care with surgeon Dr. Tye  05/16/2024, incisional biopsy of anal mass showed 1. Anus, biopsy, mass :       - INVASIVE MODERATELY DIFFERENTIATED SQUAMOUS CELL CARCINOMA  06/19/2024, left inguinal adenopathy biopsy showed Metastatic moderately differentiated squamous cell carcinoma.  '    05/29/2024 Cancer Staging  Staging form: Anus, AJCC V9 - Clinical stage from 05/29/2024: Stage IIIA (cT3, cN1a, cM0) - Signed by Babara Call, MD on 06/26/2024 Stage prefix: Initial  diagnosis   06/05/2024 Imaging   CT chest abdomen pelvis with contrast  Slight thickening in the area of the anal canal. Please correlate for history of neoplasm. There is a pathologic enlarged left inguinal node. There is a borderline right-sided node.   No additional areas of abnormal nodal enlargement or other aggressive appearing mass lesion at this time.   Fatty liver infiltration with known segment 7 hepatic hemangioma.   Of note there is a right-sided upper breast mass which was not clearly seen on prior CT scan. Please correlate for any prior study or if needed diagnostic mammographic evaluation and possible ultrasound when appropriate.     06/12/2024 Imaging   CT chest abdomen pelvis with contrast showed Slight thickening in the area of the anal canal. Please correlate for history of neoplasm. There is a pathologic enlarged left inguinal node. There is a borderline right-sided node.   No additional areas of abnormal nodal enlargement or other aggressive appearing mass lesion at this time.   Fatty liver infiltration with known segment 7 hepatic hemangioma.   Of note there is a right-sided upper breast mass which was not clearly seen on prior CT scan. Please correlate for any prior study or if needed diagnostic mammographic evaluation and possible ultrasound when appropriate.   06/19/2024 Procedure   1. Lymph node, biopsy, left inguinal adenopathy :       -  METASTATIC MODERATELY DIFFERENTIATED SQUAMOUS CELL CARCINOMA    07/01/2024 Imaging   PET scan showed 1. Hypermetabolic anal mass with hypermetabolic left inguinal adenopathy, compatible with anorectal carcinoma. 2. Hypermetabolic right breast nodule with recent biopsies on 06/18/2024. Please correlate with pathology report.   07/31/2024 - 07/31/2024 Chemotherapy   Patient is on Treatment Plan : ANUS Mitomycin  D1,28 + 5FU D1-4, 28-31 q32d     08/23/2024 Genetic Testing   Negative genetic testing. No pathogenic  variants identified on the [facility] CancerNext-Expanded+RNA Panel. The report date is 08/23/2024.  The CancerNext-Expanded gene panel offered by [facility] and includes sequencing, rearrangement, and RNA analysis for the following 77 genes: AIP, ALK, APC, ATM, AXIN2, BAP1, BARD1, BMPR1A, BRCA1, BRCA2, BRIP1, CDC73, CDH1, CDK4, CDKN1B, CDKN2A, CEBPA, CHEK2, CTNNA1, DDX41, DICER1, ETV6, FH, FLCN, GATA2, LZTR1, MAX, MBD4, MEN1, MET, MLH1, MSH2, MSH3, MSH6, MUTYH, NF1, NF2, NTHL1, PALB2, PHOX2B, PMS2, POT1, PRKAR1A, PTCH1, PTEN, RAD51C, RAD51D, RB1, RET, RPS20, RUNX1, SDHA, SDHAF2, SDHB, SDHC, SDHD, SMAD4, SMARCA4, SMARCB1, SMARCE1, STK11, SUFU, TMEM127, TP53, TSC1, TSC2, VHL, and WT1 (sequencing and deletion/duplication); EGFR, HOXB13, KIT, MITF, PDGFRA, POLD1, and POLE (sequencing only); EPCAM and GREM1 (deletion/duplication only).    08/29/2024 - 08/29/2024 Chemotherapy   Patient is on Treatment Plan : ANUS Mitomycin  D1,29 + Capecitabine + XRT     Invasive carcinoma of breast (HCC)  06/12/2024 Imaging   CT chest abdomen pelvis with contrast showed Slight thickening in the area of the anal canal. Please correlate for history of neoplasm. There is a pathologic enlarged left inguinal node. There is a borderline right-sided node.   No additional areas of abnormal nodal enlargement or other aggressive appearing mass lesion at this time.   Fatty liver infiltration with known segment 7 hepatic hemangioma.   Of note there is a right-sided upper breast mass which was not clearly seen on prior CT scan. Please correlate for any prior study or if needed diagnostic  mammographic evaluation and possible ultrasound when appropriate.   06/17/2024 Mammogram   1. There is a suspicious 17 mm mass at the site of CT and mammographic concern in the RIGHT breast. It demonstrates associated pleomorphic calcifications. Recommend ultrasound-guided biopsy for definitive characterization. 2. There is incidental  sonographic note of a 7 mm mass in the RIGHT breast at 12 o'clock and a 6 mm non mass area at 9 o'clock. Recommend ultrasound-guided biopsy of these 2 areas for definitive characterization given suspicious appearance of the dominant mass. 3. No suspicious RIGHT axillary adenopathy. 4. No mammographic evidence of malignancy in the LEFT breast.   06/18/2024 Cancer Staging   Staging form: Breast, AJCC 8th Edition - Clinical stage from 06/18/2024: Stage IB (cT1b, cN0, cM0, G3, ER-, PR-, HER2-) - Signed by Babara Call, MD on 06/26/2024 Stage prefix: Initial diagnosis Histologic grading system: 3 grade system   06/18/2024 Initial Diagnosis   Invasive carcinoma of breast [facility])  Patient with right breast biopsy.  Pathology showed 1. Breast, right, needle core biopsy, 12:00 12cmfn (heart clip) :      INVASIVE DUCTAL CARCINOMA      TUBULE FORMATION: SCORE 3      NUCLEAR PLEOMORPHISM: SCORE 3      MITOTIC COUNT: SCORE 2      TOTAL SCORE: 8      OVERALL GRADE: 3      LYMPHOVASCULAR INVASION: NOT IDENTIFIED      CANCER LENGTH: 1.0 CM      CALCIFICATIONS: NOT IDENTIFIED      OTHER FINDINGS: NONE      ER negative, PR negative, HER2 negative (IHC 0) Ki-67 95%.       2. Breast, right, needle core biopsy, 12:00 8cmfn (coil clip) :      INVASIVE DUCTAL CARCINOMA      DUCTAL CARCINOMA IN SITU, SOLID, HIGH NUCLEAR GRADE WITH NECROSIS      TUBULE FORMATION: SCORE 3      NUCLEAR PLEOMORPHISM: SCORE 3      MITOTIC COUNT: SCORE 2      TOTAL SCORE: 8      OVERALL GRADE: 3      LYMPHOVASCULAR INVASION: NOT IDENTIFIED      CANCER LENGTH: 0.3 CM      CALCIFICATIONS: NOT IDENTIFIED      OTHER FINDINGS: NONE            3. Breast, right, needle core biopsy, 9:00 12cmfn (venus) :      BENIGN BREAST TISSUE WITH DENSE STROMAL FIBROSIS.      NEGATIVE FOR ATYPIA OR MALIGNANCY.    07/08/2024 Surgery   Patient went right breast lumpectomy and sentinel lymph node biopsy.  1. Breast, lumpectomy, Right breast mass  :      - INVASIVE CARCINOMA OF NO SPECIAL TYPE (DUCTAL), MULTIFOCAL.      - DUCTAL CARCINOMA IN SITU (DCIS).      - SEE CANCER SUMMARY AND NOTE BELOW.      - TWO BIOPSY SITES WITH CORRESPONDING HEART AND COIL CLIPS.      - TWO SAVI SCOUT TAGS.       2. Lymph node, sentinel, biopsy, Right :      - MICROMETASTATIC CARCINOMA INVOLVES ONE OF FIVE LYMPH NODES (1MI/5).      - SEE CANCER SUMMARY AND NOTE BELOW.       3. Breast, excision, Right medial posterior margin :      - BENIGN BREAST TISSUE.      -  NEGATIVE FOR ATYPIA AND MALIGNANCY.      - SEE CANCER SUMMARY BELOW.   TUMOR Histologic Type: Invasive carcinoma of no special type (ductal) Histologic Grade (Nottingham Histologic Score) Glandular (Acinar)/Tubular Differentiation: 3 Nuclear Pleomorphism: 3 Mitotic Rate: 3 Overall Grade: 3 Tumor Size: Greatest dimension of largest invasive focus: 19 mm Ductal Carcinoma In Situ (DCIS): Present, high-grade with comedonecrosis Lymphatic and/or Vascular Invasion: Present, extensive (LVI in 2 or more blocks)  Treatment Effect in the Breast: No known presurgical therapy MARGINS Margin Status for Invasive Carcinoma: Invasive Carcinoma involves inferior (unifocal) and superior (unifocal) margins and is 0.5 mm to anterior margin Margin Status for DCIS: All margins negative for DCIS Distance from DCIS to closest margin: 0.5 mm Specify closest margin: Inferior REGIONAL LYMPH NODES Regional Lymph Node Status: Tumor present in regional lymph node(s) Number of Lymph Nodes with Macrometastases (greater than 2 mm): 0 Number of Lymph Nodes with Micrometastases (greater than 0.2 mm to 2 mm and/or greater than 200 cells): 1 Number of Lymph Nodes with Isolated Tumor Cells (0.2 mm or less OR 200 cells or less): 0 Size of Largest Metastatic Deposit: 1.9 mm Extranodal Extension: Not identified Total Number of Lymph Nodes Examined (sentinel and non-sentinel): 5 Number of Sentinel Nodes Examined:  5 DISTANT METASTASIS Distant Site(s) Involved, if applicable: Not applicable PATHOLOGIC STAGE CLASSIFICATION (pTNM, AJCC 8th Edition): Modified Classification: Not applicable pT Category: pT1c T Suffix: (m) multiple primary synchronous tumors in a single organ pN Category: pN82mi N Suffix: (sn) pM Category: Not applicable SPECIAL STUDIES Breast Biomarker Testing Performed on Previous Biopsy: [id number] (heart) Estrogen Receptor: Negative (0%) Progesterone Receptor: Negative (0%) HER2 IHC: Negative (0) Proliferation Marker Ki67: 95%   07/22/2024 Cancer Staging   Staging form: Breast, AJCC 8th Edition - Pathologic stage from 07/22/2024: Stage IB (pT1c, pN85mi(sn), cM0, G3, ER-, PR-, HER2-) - Signed by Babara Call, MD on 07/22/2024 Stage prefix: Initial diagnosis Method of lymph node assessment: Sentinel lymph node biopsy Multigene prognostic tests performed: None Histologic grading system: 3 grade system   07/25/2024 Surgery   Patient reports additional excision for positive margin.  1. Breast, excision, superior margin of right breast - RESIDUAL INVASIVE DUCTAL CARCINOMA, COMPLETELY EXCISED. - CARCINOMA PRESENT 2 MM FROM THE POSTERIOR MARGIN AND 4 MM FROM THE ANTERIOR AND SUPERIOR MARGINS. 2. Breast, excision, inferior margin of right breast - RESIDUAL INVASIVE DUCTAL CARCINOMA, COMPLETELY EXCISED. - CARCINOMA PRESENT 1 MM FROM THE NEW INFERIOR MARGIN.   08/23/2024 Genetic Testing   Negative genetic testing. No pathogenic variants identified on the [facility] CancerNext-Expanded+RNA Panel. The report date is 08/23/2024.  The CancerNext-Expanded gene panel offered by [facility] and includes sequencing, rearrangement, and RNA analysis for the following 77 genes: AIP, ALK, APC, ATM, AXIN2, BAP1, BARD1, BMPR1A, BRCA1, BRCA2, BRIP1, CDC73, CDH1, CDK4, CDKN1B, CDKN2A, CEBPA, CHEK2, CTNNA1, DDX41, DICER1, ETV6, FH, FLCN, GATA2, LZTR1, MAX, MBD4, MEN1, MET, MLH1, MSH2, MSH3, MSH6, MUTYH, NF1, NF2,  NTHL1, PALB2, PHOX2B, PMS2, POT1, PRKAR1A, PTCH1, PTEN, RAD51C, RAD51D, RB1, RET, RPS20, RUNX1, SDHA, SDHAF2, SDHB, SDHC, SDHD, SMAD4, SMARCA4, SMARCB1, SMARCE1, STK11, SUFU, TMEM127, TP53, TSC1, TSC2, VHL, and WT1 (sequencing and deletion/duplication); EGFR, HOXB13, KIT, MITF, PDGFRA, POLD1, and POLE (sequencing only); EPCAM and GREM1 (deletion/duplication only).    10/23/2024 -  Chemotherapy   Patient is on Treatment Plan : BREAST DOSE DENSE AC q14d / PACLitaxel q7d      Discussed the use of AI scribe software for clinical note transcription with the patient, who gave verbal consent to  proceed.   Patient has a history of VIN 3 status post vulvectomy  07/11/2024,she was evaluated by cardiology with palpitations and atypical chest pain. Chest pain deemed atypical and she went on to have a normal echo at 07/19/2024.   07/23/2024, patient was seen by symptom management for right neck pain.  She was seen by Tinnie Dawn.  Right upper extremity ultrasound was obtained which showed acute occlusive DVT involving the right internal jugular and subclavian veins.  Patient was started on Lovenox , she was supposed to be started on 1 mg/kg twice daily however was started off 40 mg twice daily which is a subtherapeutic dosage. She was asked to hold off the evening dose prior to her breast re-excision.  07/29/2024, patient presented emergency room due to shortness of breath and chest pain.  CTA angiogram showed subsegmental PE medially in the left lower lobe.  Shortness of breath was felt to be multifactorial secondary to asthma/bronchitis, pulmonary embolism.  Symptoms are better after supportive care and she was discharged on Lovenox  80 mg subcutaneously twice daily.  08/31/2024 -09/06/2024 hospitalized due to sepsis due to multifocal pneumonia, UTI. Respiratory viral panel positive for metapneumovirus  Patient resumed on radiation on 09/16/24.  Patient took Xeloda twice daily and continued radiation till 09/19/2024.   She was not able to continue radiation on 09/20/2024 due to severe diarrhea.  11/7 radiation was not given due to patient's symptoms Final radiation was done on 09/25/2024. Last dose of Xeloda was omitted.   She feels better after last IVF hydration with steroid and antiemetics.  Still has ongoing diarrhea, 5 time yesterday, slightly better. She take Lomotil QID. Mild nausea.  Rectal pain, she take oxycodone  PRN and uses Fetanyl patch.    MEDICAL HISTORY:  Past Medical History:  Diagnosis Date   Anemia    Anginal pain    Anxiety    Arthritis    Asthma    Cancer (HCC)    rectal cancer, breast cancer 2025   Chronic pain syndrome 05/21/2023   DDD (degenerative disc disease), cervical 05/22/2023   Deep venous thrombosis (HCC)    Depression    Dyspnea    Fibromyalgia    GERD (gastroesophageal reflux disease)    Headache    Heart murmur    History of hiatal hernia    History of kidney stones    Lupus    Malignant neoplasm of upper-outer quadrant of right female breast, unspecified estrogen receptor status (HCC) 2025   Pneumonia    PTSD (post-traumatic stress disorder)    Skin candidiasis 05/29/2024   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    SURGICAL HISTORY: Past Surgical History:  Procedure Laterality Date   BREAST BIOPSY Right 06/18/2024   US  RT BREAST BX W LOC DEV 1ST LESION IMG BX SPEC US  GUIDE 06/18/2024 [facility]   BREAST BIOPSY Right 06/18/2024   US  RT BREAST BX W LOC DEV EA ADD LESION IMG BX SPEC US  GUIDE 06/18/2024 [facility]   BREAST BIOPSY Right 06/18/2024   US  RT BREAST BX W LOC DEV EA ADD LESION IMG BX SPEC US  GUIDE 06/18/2024 [facility]   BREAST BIOPSY Right 07/05/2024   US  RT BREAST SAVI/RF TAG 1ST LESION US  GUIDE 07/05/2024 [facility]   BREAST BIOPSY Right 07/05/2024   US  RT BREAST SAVI/RF TAG EA ADD'L LESION US  GUIDE 07/05/2024 [facility]   BREAST LUMPECTOMY Right 07/25/2024   Procedure: BREAST LUMPECTOMY;  Surgeon:  Tye Millet, DO;  Location: [facility];  Service: General;  Laterality:  Right;   BREAST LUMPECTOMY WITH RADIO FREQUENCY LOCALIZER Right 07/08/2024   Procedure: BREAST LUMPECTOMY WITH RADIO FREQUENCY LOCALIZER;  Surgeon: Tye Millet, DO;  Location: [facility];  Service: General;  Laterality: Right;   COLONOSCOPY WITH PROPOFOL  N/A 02/17/2023   Procedure: COLONOSCOPY WITH PROPOFOL ;  Surgeon: Maryruth Ole DASEN, MD;  Location: [facility];  Service: Endoscopy;  Laterality: N/A;   ESOPHAGOGASTRODUODENOSCOPY (EGD) WITH PROPOFOL  N/A 02/17/2023   Procedure: ESOPHAGOGASTRODUODENOSCOPY (EGD) WITH PROPOFOL ;  Surgeon: Maryruth Ole DASEN, MD;  Location: [facility];  Service: Endoscopy;  Laterality: N/A;   EXTRACORPOREAL SHOCK WAVE LITHOTRIPSY     IR CV LINE INJECTION  08/21/2024   IR REMOVAL TUN ACCESS W/ PORT W/O FL MOD SED  08/28/2024   PORTACATH PLACEMENT Right 07/08/2024   Procedure: INSERTION, TUNNELED CENTRAL VENOUS DEVICE, WITH PORT;  Surgeon: Tye Millet, DO;  Location: [facility];  Service: General;  Laterality: Right;   RECTAL EXAM UNDER ANESTHESIA N/A 05/16/2024   Procedure: EXAM UNDER ANESTHESIA, RECTUM, INCISIONAL BIOPSY OF ANAL MASS;  Surgeon: Tye Millet, DO;  Location: [facility];  Service: General;  Laterality: N/A;   SENTINEL NODE BIOPSY Right 07/08/2024   Procedure: BIOPSY, LYMPH NODE, SENTINEL;  Surgeon: Tye Millet, DO;  Location: [facility];  Service: General;  Laterality: Right;   TONSILLECTOMY     TUBAL LIGATION     VULVECTOMY N/A 07/29/2020   Procedure: WIDE EXCISION VULVECTOMY;  Surgeon: Mancil Barter, MD;  Location: [facility];  Service: Gynecology;  Laterality: N/A;    SOCIAL HISTORY: Social History   Socioeconomic History   Marital status: Divorced    Spouse name: Not on file   Number of children: 1   Years of education: Not on file   Highest education level: Not on file  Occupational History   Not on file  Tobacco Use   Smoking status: Never   Smokeless tobacco:  Never  Vaping Use   Vaping status: Never Used  Substance and Sexual Activity   Alcohol use: Not Currently   Drug use: No   Sexual activity: Yes  Other Topics Concern   Not on file  Social History Narrative   Not on file   Social Drivers of Health   Financial Resource Strain: Medium Risk (05/15/2024)   Received from [facility]   Overall Financial Resource Strain (CARDIA)    Difficulty of Paying Living Expenses: Somewhat hard  Food Insecurity: No Food Insecurity (09/07/2024)   Hunger Vital Sign    Worried About Running Out of Food in the Last Year: Never true    Ran Out of Food in the Last Year: Never true  Recent Concern: Food Insecurity - Food Insecurity Present (08/22/2024)   Hunger Vital Sign    Worried About Running Out of Food in the Last Year: Sometimes true    Ran Out of Food in the Last Year: Sometimes true  Transportation Needs: No Transportation Needs (09/07/2024)   PRAPARE - Administrator, Civil Service (Medical): No    Lack of Transportation (Non-Medical): No  Physical Activity: Not on file  Stress: Not on file  Social Connections: Not on file  Intimate Partner Violence: Not At Risk (09/07/2024)   Humiliation, Afraid, Rape, and Kick questionnaire    Fear of Current or Ex-Partner: No    Emotionally Abused: No    Physically Abused: No    Sexually Abused: No    FAMILY HISTORY: Family History  Problem Relation Age of Onset   Diabetes Mother  Congestive Heart Failure Mother    Colon cancer Father 58   Lupus Maternal Grandmother    Cancer Maternal Grandfather        either prostate or colon   Colon cancer Paternal Grandfather    Lung cancer Paternal Grandfather     ALLERGIES:  is allergic to prednisone and silicone.  MEDICATIONS:  Current Outpatient Medications  Medication Sig Dispense Refill   albuterol  (VENTOLIN  HFA) 108 (90 Base) MCG/ACT inhaler Inhale 2 puffs into the lungs every 4 (four) hours as needed for wheezing or  shortness of breath. 8.5 g 2   ascorbic acid (VITAMIN C) 500 MG tablet Take 500 mg by mouth daily.     azelastine (ASTELIN) 0.1 % nasal spray Place 1 spray into both nostrils 2 (two) times daily. Use in each nostril as directed 30 mL 0   benzonatate  (TESSALON ) 200 MG capsule Take 1 capsule (200 mg total) by mouth 3 (three) times daily as needed for cough. 30 capsule 1   BIOTIN PO Take by mouth daily.     budesonide  (PULMICORT ) 0.25 MG/2ML nebulizer solution Take 2 mLs (0.25 mg total) by nebulization 2 (two) times daily. 60 mL 12   busPIRone  (BUSPAR ) 5 MG tablet Take 5 mg by mouth 2 (two) times daily.     cholecalciferol (VITAMIN D3) 25 MCG (1000 UNIT) tablet Take 1,000 Units by mouth daily.     dexamethasone  (DECADRON ) 4 MG tablet Take 2 tablets (8 mg total) by mouth daily for 2 days. Start the day after doxorubicin/cyclophosphamide chemotherapy. Take with food. 30 tablet 1   diphenoxylate-atropine (LOMOTIL) 2.5-0.025 MG tablet Take 1 tablet by mouth 4 (four) times daily. 60 tablet 0   DULoxetine  (CYMBALTA ) 30 MG capsule Take 30 mg by mouth at bedtime.     enoxaparin  (LOVENOX ) 80 MG/0.8ML injection Inject 0.8 mLs (80 mg total) into the skin every 12 (twelve) hours. 48 mL 2   esomeprazole  (NEXIUM ) 40 MG capsule Take 1 capsule (40 mg total) by mouth 2 (two) times daily before a meal. 60 capsule 2   gabapentin  (NEURONTIN ) 300 MG capsule Take 600 mg by mouth 2 (two) times daily.     HYDROcodone  bit-homatropine (HYCODAN) 5-1.5 MG/5ML syrup Take 5 mLs by mouth every 6 (six) hours as needed for cough. 120 mL 0   hydrocortisone (ANUSOL-HC) 2.5 % rectal cream Place 1 Application rectally 2 (two) times daily. 90 g 2   ipratropium-albuterol  (DUONEB) 0.5-2.5 (3) MG/3ML SOLN Take 3 mLs by nebulization 4 (four) times daily. 360 mL 3   lidocaine  (LIDODERM ) 5 % 1 patch daily.     linaclotide (LINZESS) 145 MCG CAPS capsule Take 145 mcg by mouth daily before breakfast.     LORazepam (ATIVAN) 0.5 MG tablet Take 1  tablet (0.5 mg total) by mouth every 12 (twelve) hours as needed for anxiety (nausea). 30 tablet 0   montelukast  (SINGULAIR ) 10 MG tablet Take 1 tablet (10 mg total) by mouth See admin instructions. Take 1 tablet on the day prior to chemotherapy and take 1 tablet daily for 2 days after chemotherapy. 30 tablet 0   Multiple Vitamin (MULTIVITAMIN WITH MINERALS) TABS tablet Take 1 tablet by mouth daily.     nystatin  (MYCOSTATIN ) 100000 UNIT/ML suspension Take 5 mLs (500,000 Units total) by mouth 4 (four) times daily. Swish and swallow 473 mL 1   OLANZapine (ZYPREXA) 5 MG tablet Take 1 tablet (5 mg total) by mouth at bedtime. 30 tablet 3   potassium chloride  SA (KLOR-CON   M) 20 MEQ tablet Take 1 tablet (20 mEq total) by mouth 2 (two) times daily. 30 tablet 0   prochlorperazine  (COMPAZINE ) 10 MG tablet Take 1 tablet (10 mg total) by mouth every 6 (six) hours as needed for nausea or vomiting. 30 tablet 1   promethazine  (PHENERGAN ) 25 MG tablet Take 1 tablet (25 mg total) by mouth every 8 (eight) hours as needed for nausea or vomiting. 60 tablet 0   Turmeric (QC TUMERIC COMPLEX PO) Take by mouth daily.     capecitabine (XELODA) 500 MG tablet Take 3 tablets (1,500 mg total) by mouth 2 (two) times daily after a meal. Take within 30 minutes after meals. Take only on days when you have radiation. Start on 08/29/24. (Patient not taking: Reported on 09/30/2024) 60 tablet 0   fentaNYL  (DURAGESIC ) 25 MCG/HR Place 1 patch onto the skin every 3 (three) days. 10 patch 0   midodrine (PROAMATINE) 10 MG tablet Take 1 tablet (10 mg total) by mouth 3 (three) times daily with meals. (Patient not taking: Reported on 09/30/2024) 90 tablet 0   oxyCODONE  (OXY IR/ROXICODONE ) 5 MG immediate release tablet Take 1 tablet (5 mg total) by mouth every 6 (six) hours as needed for severe pain (pain score 7-10) or breakthrough pain. 30 tablet 0   No current facility-administered medications for this visit.    Review of Systems   Constitutional:  Positive for fatigue. Negative for chills and fever.  HENT:   Negative for hearing loss and voice change.        Nasal congestion  Eyes:  Negative for eye problems.  Respiratory:  Negative for chest tightness, cough and shortness of breath.   Cardiovascular:  Negative for chest pain.  Gastrointestinal:  Positive for diarrhea and nausea. Negative for abdominal distention, abdominal pain and blood in stool.       Anal mass- rectal pain  Endocrine: Negative for hot flashes.  Genitourinary:  Negative for difficulty urinating and frequency.   Musculoskeletal:  Negative for arthralgias.  Skin:  Negative for itching and rash.  Neurological:  Negative for extremity weakness.  Hematological:  Negative for adenopathy.  Psychiatric/Behavioral:  Negative for confusion.      PHYSICAL EXAMINATION: ECOG PERFORMANCE STATUS: 0 - Asymptomatic  Vitals:   09/30/24 0916  BP: 106/69  Pulse: 98  Resp: 16  Temp: 97.6 F (36.4 C)  SpO2: 98%   Filed Weights   09/30/24 0916  Weight: 184 lb (83.5 kg)    Physical Exam Constitutional:      General: She is not in acute distress.    Appearance: She is not diaphoretic.  HENT:     Head: Normocephalic and atraumatic.     Mouth/Throat:     Pharynx: No oropharyngeal exudate.  Eyes:     General: No scleral icterus. Cardiovascular:     Rate and Rhythm: Normal rate and regular rhythm.     Heart sounds: No murmur heard. Pulmonary:     Effort: Pulmonary effort is normal. No respiratory distress.     Breath sounds: No wheezing.  Abdominal:     General: There is no distension.     Palpations: Abdomen is soft.     Tenderness: There is no abdominal tenderness.  Musculoskeletal:        General: Normal range of motion.     Cervical back: Normal range of motion and neck supple.  Skin:    General: Skin is warm and dry.     Findings: No erythema.  Neurological:     Mental Status: She is alert and oriented to person, place, and time.  Mental status is at baseline.     Motor: No abnormal muscle tone.  Psychiatric:        Mood and Affect: Affect normal.         LABORATORY DATA:  I have reviewed the data as listed    Latest Ref Rng & Units 09/23/2024    2:04 PM 09/17/2024    3:17 PM 09/13/2024    5:00 AM  CBC  WBC 4.0 - 10.5 K/uL 5.0  4.6  2.3   Hemoglobin 12.0 - 15.0 g/dL 88.9  89.9  8.0   Hematocrit 36.0 - 46.0 % 33.8  31.7  25.6   Platelets 150 - 400 K/uL 322  285  250       Latest Ref Rng & Units 09/30/2024    8:35 AM 09/23/2024    2:04 PM 09/17/2024    3:17 PM  CMP  Glucose 70 - 99 mg/dL 859  890  94   BUN 6 - 20 mg/dL 8  5  6    Creatinine 0.44 - 1.00 mg/dL 9.17  9.28  9.29   Sodium 135 - 145 mmol/L 137  135  136   Potassium 3.5 - 5.1 mmol/L 3.7  3.1  3.7   Chloride 98 - 111 mmol/L 106  105  103   CO2 22 - 32 mmol/L 21  23  24    Calcium 8.9 - 10.3 mg/dL 8.8  8.6  8.5   Total Protein 6.5 - 8.1 g/dL 6.2  6.3  6.3   Total Bilirubin 0.0 - 1.2 mg/dL 1.0  0.7  0.8   Alkaline Phos 38 - 126 U/L 82  80  90   AST 15 - 41 U/L 85  75  36   ALT 0 - 44 U/L 85  44  18      RADIOGRAPHIC STUDIES: I have personally reviewed the radiological images as listed and agreed with the findings in the report. DG Chest 2 View Result Date: 09/10/2024 EXAM: 2 VIEW(S) XRAY OF THE CHEST 09/10/2024 08:42:00 AM COMPARISON: 09/06/2024 CLINICAL HISTORY: Recurrent fever, pneumonia FINDINGS: LINES, TUBES AND DEVICES: Left upper extremity PICC in place with tip at cavoatrial junction. LUNGS AND PLEURA: Low lung volumes. Small right pleural effusion. Bibasilar patchy airspace opacities. HEART AND MEDIASTINUM: No acute abnormality of the cardiac and mediastinal silhouettes. BONES AND SOFT TISSUES: No acute osseous abnormality. IMPRESSION: 1. Diffuse interstitial prominence and bibasilar patchy airspace opacities, suspicious for pneumonia. 2. Small right pleural effusion. Electronically signed by: Oneil Devonshire MD 09/10/2024 09:42 AM EDT RP  Workstation: [id number]   CT Chest Wo Contrast Result Date: 09/07/2024 EXAM: CT CHEST WITHOUT CONTRAST 09/07/2024 03:02:30 AM TECHNIQUE: CT of the chest was performed without the administration of intravenous contrast. Multiplanar reformatted images are provided for review. Automated exposure control, iterative reconstruction, and/or weight based adjustment of the mA/kV was utilized to reduce the radiation dose to as low as reasonably achievable. COMPARISON: Chest radiograph earlier today. CLINICAL HISTORY: Respiratory illness, nondiagnostic xray. Pt complaints of shortness of breath and chest tightness. Pt reports she was discharged from the hospital this morning. Pt reports she is not able to carry on a conversations without feeling short of breath. Pt reports she was admitted to hospital due to sepsis. FINDINGS: MEDIASTINUM: Trace pericardial effusion. Left arm PICC terminates in the mid SVC. The central airways are clear. LYMPH NODES: Subcentimeter mediastinal lymph nodes,  likely reactive. No hilar or axillary lymphadenopathy. LUNGS AND PLEURA: Multifocal patchy right lung opacities, right lower lobe predominant, favoring multifocal pneumonia. Mild left basilar opacity, atelectasis versus pneumonia. Small bilateral pleural effusions. No pneumothorax. SOFT TISSUES/BONES: No acute abnormality of the bones or soft tissues. UPPER ABDOMEN: Limited images of the upper abdomen demonstrate mild hepatic steatosis. IMPRESSION: 1. Multifocal patchy right lung opacities, right lower lobe predominant, favoring multifocal pneumonia. 2. Mild left basilar opacity, atelectasis versus pneumonia. 3. Small bilateral pleural effusions. Electronically signed by: Pinkie Pebbles MD 09/07/2024 03:05 AM EDT RP Workstation: [id number]   DG Chest 1 View Result Date: 09/07/2024 EXAM: 1 VIEW(S) XRAY OF THE CHEST 09/07/2024 12:01:00 AM COMPARISON: 09/03/2024 CLINICAL HISTORY: 89973 Shortness of breath 10026. Pt presents to ER from  home with complaints of shortness of breath and chest tightness. Pt reports she was discharged from the hospital this morning. Pt reports she is not able to carry on a conversations without feeling short of breath. Pt reports she ; was admitted to hospital due to sepsis. Pt also reports she thinks her PICC line is infected. Pt presents to ER from home with complaints of shortness of breath and chest tightness. Pt reports she was discharged from the hospital this morning. Pt reports she is not able to carry on a conversations without feeling short of breath. Pt reports she ; was admitted to hospital due to sepsis. Pt also reports she thinks her PICC line is infected. FINDINGS: LINES, TUBES AND DEVICES: Stable left arm PICC at the cavoatrial junction. LUNGS AND PLEURA: New patchy right lower lobe opacity, favoring pneumonia over atelectasis. No pulmonary edema. No pleural effusion. No pneumothorax. HEART AND MEDIASTINUM: No acute abnormality of the cardiac and mediastinal silhouettes. BONES AND SOFT TISSUES: No acute osseous abnormality. IMPRESSION: 1. New patchy right lower lobe opacity, favoring pneumonia over atelectasis. Electronically signed by: Pinkie Pebbles MD 09/07/2024 12:05 AM EDT RP Workstation: [id number]   US  Venous Img Lower Unilateral Right (DVT) Result Date: 09/05/2024 CLINICAL DATA:  Right lower extremity pain, edema and warmth. History of prior DVT. EXAM: RIGHT LOWER EXTREMITY VENOUS DOPPLER ULTRASOUND TECHNIQUE: Gray-scale sonography with graded compression, as well as color Doppler and duplex ultrasound were performed to evaluate the lower extremity deep venous systems from the level of the common femoral vein and including the common femoral, femoral, profunda femoral, popliteal and calf veins including the posterior tibial, peroneal and gastrocnemius veins when visible. The superficial great saphenous vein was also interrogated. Spectral Doppler was utilized to evaluate flow at rest and  with distal augmentation maneuvers in the common femoral, femoral and popliteal veins. COMPARISON:  None Available. FINDINGS: Contralateral Common Femoral Vein: Respiratory phasicity is normal and symmetric with the symptomatic side. No evidence of thrombus. Normal compressibility. Common Femoral Vein: No evidence of thrombus. Normal compressibility, respiratory phasicity and response to augmentation. Saphenofemoral Junction: No evidence of thrombus. Normal compressibility and flow on color Doppler imaging. Profunda Femoral Vein: No evidence of thrombus. Normal compressibility and flow on color Doppler imaging. Femoral Vein: No evidence of thrombus. Normal compressibility, respiratory phasicity and response to augmentation. Popliteal Vein: No evidence of thrombus. Normal compressibility, respiratory phasicity and response to augmentation. Calf Veins: No evidence of thrombus. Normal compressibility and flow on color Doppler imaging. Superficial Great Saphenous Vein: No evidence of thrombus. Normal compressibility. Venous Reflux:  None. Other Findings: No evidence of superficial thrombophlebitis or abnormal fluid collection. IMPRESSION: No evidence of right lower extremity deep venous thrombosis. Electronically Signed   By: Marcey  Luverne M.D.   On: 09/05/2024 07:49   CT ABDOMEN PELVIS W CONTRAST Result Date: 09/03/2024 CLINICAL DATA:  Urinary tract infection, right breast cancer EXAM: CT ABDOMEN AND PELVIS WITH CONTRAST TECHNIQUE: Multidetector CT imaging of the abdomen and pelvis was performed using the standard protocol following bolus administration of intravenous contrast. RADIATION DOSE REDUCTION: This exam was performed according to the departmental dose-optimization program which includes automated exposure control, adjustment of the mA and/or kV according to patient size and/or use of iterative reconstruction technique. CONTRAST:  OMNIPAQUE  IOHEXOL  300 MG/ML  SOLN COMPARISON:  08/17/2024 FINDINGS:  Lower chest: Right lower lobe airspace disease consistent with pneumonia. Trace right effusion. No pneumothorax. Skin thickening within the right breast likely post therapeutic in this patient with a known prior history of right breast cancer and lumpectomy. Hepatobiliary: No focal liver abnormality is seen. No gallstones, gallbladder wall thickening, or biliary dilatation. Pancreas: Unremarkable. No pancreatic ductal dilatation or surrounding inflammatory changes. Spleen: Normal in size without focal abnormality. Adrenals/Urinary Tract: Adrenal glands are unremarkable. Kidneys are normal, without renal calculi, focal lesion, or hydronephrosis. Bladder is unremarkable. Stomach/Bowel: No bowel obstruction or ileus. Moderate stool within the colon consistent with constipation. Normal appendix right lower quadrant. No bowel wall thickening or inflammatory change. Vascular/Lymphatic: No significant vascular findings are present. No enlarged abdominal or pelvic lymph nodes. Reproductive: Uterus and bilateral adnexa are unremarkable. Other: Trace pelvic free fluid, nonspecific. No free intraperitoneal gas. No abdominal wall hernia. Musculoskeletal: Sequela of multiple subcutaneous injections within the anterior abdominal wall. No acute or destructive bony abnormalities. Reconstructed images demonstrate no additional findings. IMPRESSION: 1. Right lower lobe pneumonia. 2. Trace pelvic free fluid, likely physiologic. 3. Moderate retained stool throughout the colon consistent with constipation. 4. Likely post therapeutic skin thickening within the right breast in this patient with a known history of right breast cancer. Electronically Signed   By: Ozell Daring M.D.   On: 09/03/2024 20:39   DG Chest 1 View Result Date: 09/03/2024 CLINICAL DATA:  Pneumonia. EXAM: DG CHEST 1V COMPARISON:  08/31/2024 FINDINGS: Left-sided PICC line unchanged with tip over the SVC. Lungs are hypoinflated with minimal stable prominence of the  central pulmonary vasculature. No acute consolidation or effusion. Cardiomediastinal silhouette and remainder the exam is unchanged. IMPRESSION: Hypoinflation without acute cardiopulmonary disease. Electronically Signed   By: Toribio Agreste M.D.   On: 09/03/2024 08:54   DG Chest Port 1 View Result Date: 08/31/2024 EXAM: 1 VIEW XRAY OF THE CHEST 08/31/2024 07:31:10 PM COMPARISON: 08/13/2024 CLINICAL HISTORY: Questionable sepsis - evaluate for abnormality. Appears to be some bleeding around the site; patient is concerned that dressing may need to be changed, states site did not appear that way when she was last scene at [facility] on Thursday. Ambulatory to triage, NAD noted at this time. FINDINGS: LINES, TUBES AND DEVICES: Interval removal of a right chest wall port-a-cath. Interval placement of a left PICC with tip overlying the expected region of the distal superior vena cava. LUNGS AND PLEURA: Slightly low lung volumes. No focal pulmonary opacity. No pulmonary edema. No pleural effusion. No pneumothorax. HEART AND MEDIASTINUM: Slightly prominent cardiac silhouette likely due to AP portable technique and low lung volumes . No acute abnormality of the mediastinal silhouette. BONES AND SOFT TISSUES: No acute osseous abnormality. IMPRESSION: 1. Low lung volumes with no definite acute cardiopulmonary process. 2. Apparent cardiomegaly on AP portable; consider repeat PA and lateral chest radiographs for confirmation. Electronically signed by: Morgane Naveau MD 08/31/2024 07:34 PM  EDT RP Workstation: [id number]

## 2024-09-30 NOTE — Progress Notes (Signed)
 Pt in for follow up, reports having rectal pain and diarrhea.  Last episode was this morning, reports 4 loose stools yesterday and no appetite.  Pt scheduled to see nutrition on 10/04/24. Pt request refill on oxycodone  and duragesic .

## 2024-09-30 NOTE — Assessment & Plan Note (Signed)
 Multifactorial, possible secondary to chemotherapy, fatty liver disease.  Bilirubin level is normal. Recommend patient to hold off using Tylenol .

## 2024-09-30 NOTE — Assessment & Plan Note (Signed)
 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  Per IR, right inguinal lymph node is not enlarged.  Previously on concurrent chemotherapy 5-FU and mitomycin  C with Radiation. S/p D1 5-FU and Mitomycin -C. D29 5-FU infusion was switched to concurrent Xeloda due to lack of medi port.- she has picc line.  Received D29 Mitomycin  C. Chemo was interrupted due to multi focal pneumonia.  Finally finished concurrent Xeloda with Radiation.  Continue supportive care

## 2024-09-30 NOTE — Assessment & Plan Note (Signed)
 Continue potassium 20 mEq twice daily

## 2024-09-30 NOTE — Assessment & Plan Note (Signed)
 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 mT1c pN21mi Initial margin was positive and patient underwent reexcision and achieved a negative margin.  Patient's case is complicated as evidenced by 2 cancer primaries.  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 Finished treatment for Stage III Anal cancer.  Once she recovered from recent chemo and radiation. Plan adjuvant ddAC followed by weekly Taxol. Obtain MUGA/Echo

## 2024-09-30 NOTE — Assessment & Plan Note (Signed)
 Continue home antiemetics as needed.  Added lorazepam 0.5 every 12 hours as needed anxiety and nausea.   Patient will get IV fluid sessions today,

## 2024-09-30 NOTE — Telephone Encounter (Signed)
 RN called and left message that patient will need to have echocardiogram done before she can start her chemotherapy treatment.  Previously scheduled for 09/20/24 but patient missed appointment. Instructed will have scheduler call to make appt or patient can call office to have it scheduled.

## 2024-10-01 ENCOUNTER — Encounter: Payer: Self-pay | Admitting: Oncology

## 2024-10-01 NOTE — Telephone Encounter (Signed)
 Echo r/s for 11/26 @ 9am. Pt called not answer. Mychart message sent and pt confirmed that she will be there.

## 2024-10-02 ENCOUNTER — Inpatient Hospital Stay: Admitting: Oncology

## 2024-10-02 ENCOUNTER — Other Ambulatory Visit: Payer: Self-pay

## 2024-10-02 ENCOUNTER — Other Ambulatory Visit (INDEPENDENT_AMBULATORY_CARE_PROVIDER_SITE_OTHER): Payer: Self-pay | Admitting: Nurse Practitioner

## 2024-10-02 ENCOUNTER — Inpatient Hospital Stay

## 2024-10-02 ENCOUNTER — Encounter: Payer: Self-pay | Admitting: Oncology

## 2024-10-02 DIAGNOSIS — I82C11 Acute embolism and thrombosis of right internal jugular vein: Secondary | ICD-10-CM

## 2024-10-02 DIAGNOSIS — J45901 Unspecified asthma with (acute) exacerbation: Secondary | ICD-10-CM

## 2024-10-02 DIAGNOSIS — R7309 Other abnormal glucose: Secondary | ICD-10-CM

## 2024-10-03 ENCOUNTER — Ambulatory Visit (INDEPENDENT_AMBULATORY_CARE_PROVIDER_SITE_OTHER): Admitting: Nurse Practitioner

## 2024-10-03 ENCOUNTER — Encounter (INDEPENDENT_AMBULATORY_CARE_PROVIDER_SITE_OTHER): Payer: Self-pay | Admitting: Nurse Practitioner

## 2024-10-03 ENCOUNTER — Encounter: Payer: Self-pay | Admitting: Oncology

## 2024-10-03 ENCOUNTER — Ambulatory Visit (INDEPENDENT_AMBULATORY_CARE_PROVIDER_SITE_OTHER)

## 2024-10-03 VITALS — BP 102/73 | HR 88 | Resp 16 | Ht 59.0 in | Wt 186.0 lb

## 2024-10-03 DIAGNOSIS — C50919 Malignant neoplasm of unspecified site of unspecified female breast: Secondary | ICD-10-CM

## 2024-10-03 DIAGNOSIS — I82C11 Acute embolism and thrombosis of right internal jugular vein: Secondary | ICD-10-CM

## 2024-10-03 DIAGNOSIS — I82621 Acute embolism and thrombosis of deep veins of right upper extremity: Secondary | ICD-10-CM | POA: Diagnosis not present

## 2024-10-04 ENCOUNTER — Inpatient Hospital Stay

## 2024-10-05 ENCOUNTER — Encounter (INDEPENDENT_AMBULATORY_CARE_PROVIDER_SITE_OTHER): Payer: Self-pay | Admitting: Nurse Practitioner

## 2024-10-05 NOTE — Progress Notes (Signed)
 SUBJECTIVE:  Patient ID: Monica Gonzalez, female    DOB: 1975-12-01, 48 y.o.   MRN: [id number] Chief Complaint  Patient presents with   New Patient (Initial Visit)    . Consult + UE venous. Right internal jugular vein thrombosis.  YU, ZHOU.    Discussed the use of AI scribe software for clinical note transcription with the patient, who gave verbal consent to proceed.  History of Present Illness Monica Gonzalez is a 48 year old female with breast cancer and anal cancer who presents with blood clots. She was referred by Dr. Babara for evaluation of blood clots and anticoagulation management.  She has been diagnosed with both breast cancer and anal cancer and is currently undergoing treatment for these conditions. During her treatment, a port was placed in her right chest, which was later removed due to misalignment. Subsequently, she developed a blood clot in her right internal jugular vein that extended to her subclavian vein and down her arm. A PICC line is now in place in her left arm.  She has a history of possible pulmonary emboli and is currently on Lovenox  for anticoagulation. The Lovenox  injections are painful and cause significant bruising and hematomas, describing her belly as looking like a 'pincushion'. Despite this, she is managing well with the medication.  Today her noninvasive studies show resolution of the DVT in her right upper extremity.  Her family history includes a grandmother with a clotting disorder, which she mentioned when she started experiencing clots. She has thrown three clots in quick succession, similar to her grandmother's history, although her grandmother did not have cancer but had heart issues.  She is preparing to move in with her aunt to receive support during her breast cancer treatment, which she anticipates will be challenging. She has previously experienced adverse reactions to chemotherapy for anal cancer and is hopeful for a smoother process with her current  treatment.     Results RADIOLOGY CT scan: Breast cancer detected  PATHOLOGY Lymph node biopsy: Positive  Past Medical History:  Diagnosis Date   Anemia    Anginal pain    Anxiety    Arthritis    Asthma    Cancer (HCC)    rectal cancer, breast cancer 2025   Chronic pain syndrome 05/21/2023   DDD (degenerative disc disease), cervical 05/22/2023   Deep venous thrombosis (HCC)    Depression    Dyspnea    Fibromyalgia    GERD (gastroesophageal reflux disease)    Headache    Heart murmur    History of hiatal hernia    History of kidney stones    Lupus    Malignant neoplasm of upper-outer quadrant of right female breast, unspecified estrogen receptor status (HCC) 2025   Pneumonia    PTSD (post-traumatic stress disorder)    Skin candidiasis 05/29/2024   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    Past Surgical History:  Procedure Laterality Date   BREAST BIOPSY Right 06/18/2024   US  RT BREAST BX W LOC DEV 1ST LESION IMG BX SPEC US  GUIDE 06/18/2024 [facility]   BREAST BIOPSY Right 06/18/2024   US  RT BREAST BX W LOC DEV EA ADD LESION IMG BX SPEC US  GUIDE 06/18/2024 [facility]   BREAST BIOPSY Right 06/18/2024   US  RT BREAST BX W LOC DEV EA ADD LESION IMG BX SPEC US  GUIDE 06/18/2024 [facility]   BREAST BIOPSY Right 07/05/2024   US  RT BREAST SAVI/RF TAG 1ST LESION US  GUIDE 07/05/2024 [facility]  BREAST BIOPSY Right 07/05/2024   US  RT BREAST SAVI/RF TAG EA ADD'L LESION US  GUIDE 07/05/2024 [facility]   BREAST LUMPECTOMY Right 07/25/2024   Procedure: BREAST LUMPECTOMY;  Surgeon: Tye Millet, DO;  Location: [facility];  Service: General;  Laterality: Right;   BREAST LUMPECTOMY WITH RADIO FREQUENCY LOCALIZER Right 07/08/2024   Procedure: BREAST LUMPECTOMY WITH RADIO FREQUENCY LOCALIZER;  Surgeon: Tye Millet, DO;  Location: [facility];  Service: General;  Laterality: Right;   COLONOSCOPY WITH PROPOFOL  N/A 02/17/2023   Procedure: COLONOSCOPY  WITH PROPOFOL ;  Surgeon: Maryruth Ole DASEN, MD;  Location: [facility];  Service: Endoscopy;  Laterality: N/A;   ESOPHAGOGASTRODUODENOSCOPY (EGD) WITH PROPOFOL  N/A 02/17/2023   Procedure: ESOPHAGOGASTRODUODENOSCOPY (EGD) WITH PROPOFOL ;  Surgeon: Maryruth Ole DASEN, MD;  Location: [facility];  Service: Endoscopy;  Laterality: N/A;   EXTRACORPOREAL SHOCK WAVE LITHOTRIPSY     IR CV LINE INJECTION  08/21/2024   IR REMOVAL TUN ACCESS W/ PORT W/O FL MOD SED  08/28/2024   PICC LINE INSERTION N/A 08/2024   PORTACATH PLACEMENT Right 07/08/2024   Procedure: INSERTION, TUNNELED CENTRAL VENOUS DEVICE, WITH PORT;  Surgeon: Tye Millet, DO;  Location: [facility];  Service: General;  Laterality: Right;   RECTAL EXAM UNDER ANESTHESIA N/A 05/16/2024   Procedure: EXAM UNDER ANESTHESIA, RECTUM, INCISIONAL BIOPSY OF ANAL MASS;  Surgeon: Tye Millet, DO;  Location: [facility];  Service: General;  Laterality: N/A;   SENTINEL NODE BIOPSY Right 07/08/2024   Procedure: BIOPSY, LYMPH NODE, SENTINEL;  Surgeon: Tye Millet, DO;  Location: [facility];  Service: General;  Laterality: Right;   TONSILLECTOMY     TUBAL LIGATION     VULVECTOMY N/A 07/29/2020   Procedure: WIDE EXCISION VULVECTOMY;  Surgeon: Mancil Barter, MD;  Location: [facility];  Service: Gynecology;  Laterality: N/A;    Social History   Socioeconomic History   Marital status: Divorced    Spouse name: Not on file   Number of children: 1   Years of education: Not on file   Highest education level: Not on file  Occupational History   Not on file  Tobacco Use   Smoking status: Never   Smokeless tobacco: Never  Vaping Use   Vaping status: Never Used  Substance and Sexual Activity   Alcohol use: Not Currently   Drug use: No   Sexual activity: Yes  Other Topics Concern   Not on file  Social History Narrative   Not on file   Social Drivers of Health   Financial Resource Strain: Medium Risk (05/15/2024)   Received from [facility]   Overall Financial Resource Strain (CARDIA)    Difficulty of Paying Living Expenses: Somewhat hard  Food Insecurity: No Food Insecurity (09/07/2024)   Hunger Vital Sign    Worried About Running Out of Food in the Last Year: Never true    Ran Out of Food in the Last Year: Never true  Recent Concern: Food Insecurity - Food Insecurity Present (08/22/2024)   Hunger Vital Sign    Worried About Running Out of Food in the Last Year: Sometimes true    Ran Out of Food in the Last Year: Sometimes true  Transportation Needs: No Transportation Needs (09/07/2024)   PRAPARE - Administrator, Civil Service (Medical): No    Lack of Transportation (Non-Medical): No  Physical Activity: Not on file  Stress: Not on file  Social Connections: Not on file  Intimate Partner Violence: Not At Risk (09/07/2024)   Humiliation,  Afraid, Rape, and Kick questionnaire    Fear of Current or Ex-Partner: No    Emotionally Abused: No    Physically Abused: No    Sexually Abused: No    Family History  Problem Relation Age of Onset   Diabetes Mother    Congestive Heart Failure Mother    Colon cancer Father 67   Lupus Maternal Grandmother    Cancer Maternal Grandfather        either prostate or colon   Colon cancer Paternal Grandfather    Lung cancer Paternal Grandfather     Allergies  Allergen Reactions   Prednisone Shortness Of Breath and Swelling    Patient has taken methylprednisolone  (Depo-Medrol ) and dexamethasone  (Decadron ) without any documented side effects or adverse reactions. She describes that she used to take prednisone due to her bronchial asthma and respiratory problems, but later on she received some prednisone which into locations caused her to have significant swelling and shortness of breath. For that reason, she was told to avoid the use of prednisone.   Silicone Hives and Itching     Review of Systems   Review of Systems: Negative Unless Checked Constitutional:  [] Weight loss  [] Fever  [] Chills Cardiac: [] Chest pain   []  Atrial Fibrillation  [] Palpitations   [] Shortness of breath when laying flat   [] Shortness of breath with exertion. [] Shortness of breath at rest Vascular:  [] Pain in legs with walking   [] Pain in legs with standing [] Pain in legs when laying flat   [] Claudication    [] Pain in feet when laying flat    [] History of DVT   [] Phlebitis   [] Swelling in legs   [] Varicose veins   [] Non-healing ulcers Pulmonary:   [] Uses home oxygen   [] Productive cough   [] Hemoptysis   [] Wheeze  [] COPD   [] Asthma Neurologic:  [] Dizziness   [] Seizures  [] Blackouts [] History of stroke   [] History of TIA  [] Aphasia   [] Temporary Blindness   [] Weakness or numbness in arm   [] Weakness or numbness in leg Musculoskeletal:   [] Joint swelling   [] Joint pain   [] Low back pain  []  History of Knee Replacement [] Arthritis [] back Surgeries  []  Spinal Stenosis    Hematologic:  [x] Easy bruising  [x] Easy bleeding   [] Hypercoagulable state   [] Anemic Gastrointestinal:  [] Diarrhea   [] Vomiting  [] Gastroesophageal reflux/heartburn   [] Difficulty swallowing. [] Abdominal pain Genitourinary:  [] Chronic kidney disease   [] Difficult urination  [] Anuric   [] Blood in urine [] Frequent urination  [] Burning with urination   [] Hematuria Skin:  [] Rashes   [] Ulcers [] Wounds Psychological:  [] History of anxiety   []  History of major depression  []  Memory Difficulties      OBJECTIVE:     BP 102/73   Pulse 88   Resp 16   Ht 4' 11 (1.499 m)   Wt 186 lb (84.4 kg)   BMI 37.57 kg/m   Physical Exam Vitals reviewed.  HENT:     Head: Normocephalic.  Cardiovascular:     Rate and Rhythm: Normal rate.     Pulses: Normal pulses.  Pulmonary:     Effort: Pulmonary effort is normal.  Skin:    General: Skin is warm and dry.  Neurological:     Mental Status: She is alert and oriented to person, place, and time.  Psychiatric:        Mood and Affect: Mood normal.        Behavior: Behavior  normal.        Thought  Content: Thought content normal.        Judgment: Judgment normal.    Physical Exam     CMP     Component Value Date/Time   NA 137 09/30/2024 0835   NA 141 03/06/2015 1307   K 3.7 09/30/2024 0835   K 3.2 (L) 03/06/2015 1307   CL 106 09/30/2024 0835   CL 107 03/06/2015 1307   CO2 21 (L) 09/30/2024 0835   CO2 25 03/06/2015 1307   GLUCOSE 140 (H) 09/30/2024 0835   GLUCOSE 83 03/06/2015 1307   BUN 8 09/30/2024 0835   BUN 5 (L) 03/06/2015 1307   CREATININE 0.82 09/30/2024 0835   CREATININE 0.89 03/06/2015 1307   CALCIUM 8.8 (L) 09/30/2024 0835   CALCIUM 8.8 (L) 03/06/2015 1307   PROT 6.2 (L) 09/30/2024 0835   PROT 7.4 03/06/2015 1307   ALBUMIN  3.1 (L) 09/30/2024 0835   ALBUMIN  4.2 03/06/2015 1307   AST 85 (H) 09/30/2024 0835   ALT 85 (H) 09/30/2024 0835   ALT 14 03/06/2015 1307   ALKPHOS 82 09/30/2024 0835   ALKPHOS 46 03/06/2015 1307   BILITOT 1.0 09/30/2024 0835   GFRNONAA >60 09/30/2024 0835   GFRNONAA >60 03/06/2015 1307    No results found.     ASSESSMENT AND PLAN:  1. Acute deep vein thrombosis (DVT) of other vein of right upper extremity (HCC) (Primary) Resolved venous thromboembolism in the internal jugular and subclavian veins, likely provoked by a misplaced port and exacerbated by active malignancy. Transition to Eliquis considered for lower bleeding risk and better efficacy. Long-term anticoagulation recommended due to ongoing cancer treatment and high risk of recurrence. - Spoke with Dr. Babara directly and she recommends to continue with Lovenox .  She notes she has plan to transition her to Eliquis at a later date when she is done with procedures. - Continue anticoagulation therapy during active cancer treatment.  2. Invasive carcinoma of breast (HCC) In speaking with Dr. Babara she would like to have her port replaced her right chest instead of leaving it in her left PICC line.  This is due to the fact that the patient will be receiving  chemotherapy and a PICC line is a less than optimal place.  I have contacted patient regarding placement left a voicemail so that we can reschedule him.     Current Outpatient Medications on File Prior to Visit  Medication Sig Dispense Refill   albuterol  (VENTOLIN  HFA) 108 (90 Base) MCG/ACT inhaler Inhale 2 puffs into the lungs every 4 (four) hours as needed for wheezing or shortness of breath. 8.5 g 2   ascorbic acid (VITAMIN C) 500 MG tablet Take 500 mg by mouth daily.     benzonatate  (TESSALON ) 200 MG capsule Take 1 capsule (200 mg total) by mouth 3 (three) times daily as needed for cough. 30 capsule 1   BIOTIN PO Take by mouth daily.     budesonide  (PULMICORT ) 0.25 MG/2ML nebulizer solution Take 2 mLs (0.25 mg total) by nebulization 2 (two) times daily. 60 mL 12   busPIRone  (BUSPAR ) 5 MG tablet Take 5 mg by mouth 2 (two) times daily.     cholecalciferol (VITAMIN D3) 25 MCG (1000 UNIT) tablet Take 1,000 Units by mouth daily.     dexamethasone  (DECADRON ) 4 MG tablet Take 2 tablets (8 mg total) by mouth daily for 2 days. Start the day after doxorubicin/cyclophosphamide chemotherapy. Take with food. 30 tablet 1   diphenoxylate -atropine  (LOMOTIL ) 2.5-0.025 MG tablet Take 1 tablet by  mouth 4 (four) times daily. 60 tablet 0   DULoxetine  (CYMBALTA ) 30 MG capsule Take 30 mg by mouth at bedtime.     enoxaparin  (LOVENOX ) 80 MG/0.8ML injection Inject 0.8 mLs (80 mg total) into the skin every 12 (twelve) hours. 48 mL 2   esomeprazole  (NEXIUM ) 40 MG capsule Take 1 capsule (40 mg total) by mouth 2 (two) times daily before a meal. 60 capsule 2   fentaNYL  (DURAGESIC ) 25 MCG/HR Place 1 patch onto the skin every 3 (three) days. 10 patch 0   gabapentin  (NEURONTIN ) 300 MG capsule Take 600 mg by mouth 2 (two) times daily.     HYDROcodone  bit-homatropine (HYCODAN) 5-1.5 MG/5ML syrup Take 5 mLs by mouth every 6 (six) hours as needed for cough. 120 mL 0   hydrocortisone  (ANUSOL -HC) 2.5 % rectal cream Place 1  Application rectally 2 (two) times daily. 90 g 2   ipratropium-albuterol  (DUONEB) 0.5-2.5 (3) MG/3ML SOLN Take 3 mLs by nebulization 4 (four) times daily. 360 mL 3   linaclotide (LINZESS) 145 MCG CAPS capsule Take 145 mcg by mouth daily before breakfast.     LORazepam  (ATIVAN ) 0.5 MG tablet Take 1 tablet (0.5 mg total) by mouth every 12 (twelve) hours as needed for anxiety (nausea). 30 tablet 0   montelukast  (SINGULAIR ) 10 MG tablet Take 1 tablet (10 mg total) by mouth See admin instructions. Take 1 tablet on the day prior to chemotherapy and take 1 tablet daily for 2 days after chemotherapy. 30 tablet 0   Multiple Vitamin (MULTIVITAMIN WITH MINERALS) TABS tablet Take 1 tablet by mouth daily.     nystatin  (MYCOSTATIN ) 100000 UNIT/ML suspension Take 5 mLs (500,000 Units total) by mouth 4 (four) times daily. Swish and swallow 473 mL 1   OLANZapine  (ZYPREXA ) 5 MG tablet Take 1 tablet (5 mg total) by mouth at bedtime. 30 tablet 3   oxyCODONE  (OXY IR/ROXICODONE ) 5 MG immediate release tablet Take 1 tablet (5 mg total) by mouth every 6 (six) hours as needed for severe pain (pain score 7-10) or breakthrough pain. 30 tablet 0   potassium chloride  SA (KLOR-CON  M) 20 MEQ tablet Take 1 tablet (20 mEq total) by mouth 2 (two) times daily. 30 tablet 0   prochlorperazine  (COMPAZINE ) 10 MG tablet Take 1 tablet (10 mg total) by mouth every 6 (six) hours as needed for nausea or vomiting. 30 tablet 1   promethazine  (PHENERGAN ) 25 MG tablet Take 1 tablet (25 mg total) by mouth every 8 (eight) hours as needed for nausea or vomiting. 60 tablet 0   azelastine  (ASTELIN ) 0.1 % nasal spray Place 1 spray into both nostrils 2 (two) times daily. Use in each nostril as directed 30 mL 0   capecitabine  (XELODA ) 500 MG tablet Take 3 tablets (1,500 mg total) by mouth 2 (two) times daily after a meal. Take within 30 minutes after meals. Take only on days when you have radiation. Start on 08/29/24. (Patient not taking: Reported on  09/30/2024) 60 tablet 0   lidocaine  (LIDODERM ) 5 % 1 patch daily. (Patient not taking: Reported on 10/03/2024)     midodrine  (PROAMATINE ) 10 MG tablet Take 1 tablet (10 mg total) by mouth 3 (three) times daily with meals. (Patient not taking: Reported on 09/30/2024) 90 tablet 0   Turmeric (QC TUMERIC COMPLEX PO) Take by mouth daily. (Patient not taking: Reported on 10/03/2024)     No current facility-administered medications on file prior to visit.    There are no Patient Instructions on file  for this visit. No follow-ups on file.   [facility], NP  This note was completed with Office Manager.  Any errors are purely unintentional.

## 2024-10-05 NOTE — H&P (View-Only) (Signed)
 SUBJECTIVE:  Patient ID: Monica Gonzalez, female    DOB: August 07, 1976, 48 y.o.   MRN: [id number] Chief Complaint  Patient presents with   New Patient (Initial Visit)    . Consult + UE venous. Right internal jugular vein thrombosis.  YU, ZHOU.    Discussed the use of AI scribe software for clinical note transcription with the patient, who gave verbal consent to proceed.  History of Present Illness Monica Gonzalez is a 48 year old female with breast cancer and anal cancer who presents with blood clots. She was referred by Dr. Babara for evaluation of blood clots and anticoagulation management.  She has been diagnosed with both breast cancer and anal cancer and is currently undergoing treatment for these conditions. During her treatment, a port was placed in her right chest, which was later removed due to misalignment. Subsequently, she developed a blood clot in her right internal jugular vein that extended to her subclavian vein and down her arm. A PICC line is now in place in her left arm.  She has a history of possible pulmonary emboli and is currently on Lovenox  for anticoagulation. The Lovenox  injections are painful and cause significant bruising and hematomas, describing her belly as looking like a 'pincushion'. Despite this, she is managing well with the medication.  Today her noninvasive studies show resolution of the DVT in her right upper extremity.  Her family history includes a grandmother with a clotting disorder, which she mentioned when she started experiencing clots. She has thrown three clots in quick succession, similar to her grandmother's history, although her grandmother did not have cancer but had heart issues.  She is preparing to move in with her aunt to receive support during her breast cancer treatment, which she anticipates will be challenging. She has previously experienced adverse reactions to chemotherapy for anal cancer and is hopeful for a smoother process with her current  treatment.     Results RADIOLOGY CT scan: Breast cancer detected  PATHOLOGY Lymph node biopsy: Positive  Past Medical History:  Diagnosis Date   Anemia    Anginal pain    Anxiety    Arthritis    Asthma    Cancer (HCC)    rectal cancer, breast cancer 2025   Chronic pain syndrome 05/21/2023   DDD (degenerative disc disease), cervical 05/22/2023   Deep venous thrombosis (HCC)    Depression    Dyspnea    Fibromyalgia    GERD (gastroesophageal reflux disease)    Headache    Heart murmur    History of hiatal hernia    History of kidney stones    Lupus    Malignant neoplasm of upper-outer quadrant of right female breast, unspecified estrogen receptor status (HCC) 2025   Pneumonia    PTSD (post-traumatic stress disorder)    Skin candidiasis 05/29/2024   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    Past Surgical History:  Procedure Laterality Date   BREAST BIOPSY Right 06/18/2024   US  RT BREAST BX W LOC DEV 1ST LESION IMG BX SPEC US  GUIDE 06/18/2024 [facility]   BREAST BIOPSY Right 06/18/2024   US  RT BREAST BX W LOC DEV EA ADD LESION IMG BX SPEC US  GUIDE 06/18/2024 [facility]   BREAST BIOPSY Right 06/18/2024   US  RT BREAST BX W LOC DEV EA ADD LESION IMG BX SPEC US  GUIDE 06/18/2024 [facility]   BREAST BIOPSY Right 07/05/2024   US  RT BREAST SAVI/RF TAG 1ST LESION US  GUIDE 07/05/2024 [facility]  BREAST BIOPSY Right 07/05/2024   US  RT BREAST SAVI/RF TAG EA ADD'L LESION US  GUIDE 07/05/2024 [facility]   BREAST LUMPECTOMY Right 07/25/2024   Procedure: BREAST LUMPECTOMY;  Surgeon: Tye Millet, DO;  Location: [facility];  Service: General;  Laterality: Right;   BREAST LUMPECTOMY WITH RADIO FREQUENCY LOCALIZER Right 07/08/2024   Procedure: BREAST LUMPECTOMY WITH RADIO FREQUENCY LOCALIZER;  Surgeon: Tye Millet, DO;  Location: [facility];  Service: General;  Laterality: Right;   COLONOSCOPY WITH PROPOFOL  N/A 02/17/2023   Procedure: COLONOSCOPY  WITH PROPOFOL ;  Surgeon: Maryruth Ole DASEN, MD;  Location: [facility];  Service: Endoscopy;  Laterality: N/A;   ESOPHAGOGASTRODUODENOSCOPY (EGD) WITH PROPOFOL  N/A 02/17/2023   Procedure: ESOPHAGOGASTRODUODENOSCOPY (EGD) WITH PROPOFOL ;  Surgeon: Maryruth Ole DASEN, MD;  Location: [facility];  Service: Endoscopy;  Laterality: N/A;   EXTRACORPOREAL SHOCK WAVE LITHOTRIPSY     IR CV LINE INJECTION  08/21/2024   IR REMOVAL TUN ACCESS W/ PORT W/O FL MOD SED  08/28/2024   PICC LINE INSERTION N/A 08/2024   PORTACATH PLACEMENT Right 07/08/2024   Procedure: INSERTION, TUNNELED CENTRAL VENOUS DEVICE, WITH PORT;  Surgeon: Tye Millet, DO;  Location: [facility];  Service: General;  Laterality: Right;   RECTAL EXAM UNDER ANESTHESIA N/A 05/16/2024   Procedure: EXAM UNDER ANESTHESIA, RECTUM, INCISIONAL BIOPSY OF ANAL MASS;  Surgeon: Tye Millet, DO;  Location: [facility];  Service: General;  Laterality: N/A;   SENTINEL NODE BIOPSY Right 07/08/2024   Procedure: BIOPSY, LYMPH NODE, SENTINEL;  Surgeon: Tye Millet, DO;  Location: [facility];  Service: General;  Laterality: Right;   TONSILLECTOMY     TUBAL LIGATION     VULVECTOMY N/A 07/29/2020   Procedure: WIDE EXCISION VULVECTOMY;  Surgeon: Mancil Barter, MD;  Location: [facility];  Service: Gynecology;  Laterality: N/A;    Social History   Socioeconomic History   Marital status: Divorced    Spouse name: Not on file   Number of children: 1   Years of education: Not on file   Highest education level: Not on file  Occupational History   Not on file  Tobacco Use   Smoking status: Never   Smokeless tobacco: Never  Vaping Use   Vaping status: Never Used  Substance and Sexual Activity   Alcohol use: Not Currently   Drug use: No   Sexual activity: Yes  Other Topics Concern   Not on file  Social History Narrative   Not on file   Social Drivers of Health   Financial Resource Strain: Medium Risk (05/15/2024)   Received from [facility]   Overall Financial Resource Strain (CARDIA)    Difficulty of Paying Living Expenses: Somewhat hard  Food Insecurity: No Food Insecurity (09/07/2024)   Hunger Vital Sign    Worried About Running Out of Food in the Last Year: Never true    Ran Out of Food in the Last Year: Never true  Recent Concern: Food Insecurity - Food Insecurity Present (08/22/2024)   Hunger Vital Sign    Worried About Running Out of Food in the Last Year: Sometimes true    Ran Out of Food in the Last Year: Sometimes true  Transportation Needs: No Transportation Needs (09/07/2024)   PRAPARE - Administrator, Civil Service (Medical): No    Lack of Transportation (Non-Medical): No  Physical Activity: Not on file  Stress: Not on file  Social Connections: Not on file  Intimate Partner Violence: Not At Risk (09/07/2024)   Humiliation,  Afraid, Rape, and Kick questionnaire    Fear of Current or Ex-Partner: No    Emotionally Abused: No    Physically Abused: No    Sexually Abused: No    Family History  Problem Relation Age of Onset   Diabetes Mother    Congestive Heart Failure Mother    Colon cancer Father 67   Lupus Maternal Grandmother    Cancer Maternal Grandfather        either prostate or colon   Colon cancer Paternal Grandfather    Lung cancer Paternal Grandfather     Allergies  Allergen Reactions   Prednisone Shortness Of Breath and Swelling    Patient has taken methylprednisolone  (Depo-Medrol ) and dexamethasone  (Decadron ) without any documented side effects or adverse reactions. She describes that she used to take prednisone due to her bronchial asthma and respiratory problems, but later on she received some prednisone which into locations caused her to have significant swelling and shortness of breath. For that reason, she was told to avoid the use of prednisone.   Silicone Hives and Itching     Review of Systems   Review of Systems: Negative Unless Checked Constitutional:  [] Weight loss  [] Fever  [] Chills Cardiac: [] Chest pain   []  Atrial Fibrillation  [] Palpitations   [] Shortness of breath when laying flat   [] Shortness of breath with exertion. [] Shortness of breath at rest Vascular:  [] Pain in legs with walking   [] Pain in legs with standing [] Pain in legs when laying flat   [] Claudication    [] Pain in feet when laying flat    [] History of DVT   [] Phlebitis   [] Swelling in legs   [] Varicose veins   [] Non-healing ulcers Pulmonary:   [] Uses home oxygen   [] Productive cough   [] Hemoptysis   [] Wheeze  [] COPD   [] Asthma Neurologic:  [] Dizziness   [] Seizures  [] Blackouts [] History of stroke   [] History of TIA  [] Aphasia   [] Temporary Blindness   [] Weakness or numbness in arm   [] Weakness or numbness in leg Musculoskeletal:   [] Joint swelling   [] Joint pain   [] Low back pain  []  History of Knee Replacement [] Arthritis [] back Surgeries  []  Spinal Stenosis    Hematologic:  [x] Easy bruising  [x] Easy bleeding   [] Hypercoagulable state   [] Anemic Gastrointestinal:  [] Diarrhea   [] Vomiting  [] Gastroesophageal reflux/heartburn   [] Difficulty swallowing. [] Abdominal pain Genitourinary:  [] Chronic kidney disease   [] Difficult urination  [] Anuric   [] Blood in urine [] Frequent urination  [] Burning with urination   [] Hematuria Skin:  [] Rashes   [] Ulcers [] Wounds Psychological:  [] History of anxiety   []  History of major depression  []  Memory Difficulties      OBJECTIVE:     BP 102/73   Pulse 88   Resp 16   Ht 4' 11 (1.499 m)   Wt 186 lb (84.4 kg)   BMI 37.57 kg/m   Physical Exam Vitals reviewed.  HENT:     Head: Normocephalic.  Cardiovascular:     Rate and Rhythm: Normal rate.     Pulses: Normal pulses.  Pulmonary:     Effort: Pulmonary effort is normal.  Skin:    General: Skin is warm and dry.  Neurological:     Mental Status: She is alert and oriented to person, place, and time.  Psychiatric:        Mood and Affect: Mood normal.        Behavior: Behavior  normal.        Thought  Content: Thought content normal.        Judgment: Judgment normal.    Physical Exam     CMP     Component Value Date/Time   NA 137 09/30/2024 0835   NA 141 03/06/2015 1307   K 3.7 09/30/2024 0835   K 3.2 (L) 03/06/2015 1307   CL 106 09/30/2024 0835   CL 107 03/06/2015 1307   CO2 21 (L) 09/30/2024 0835   CO2 25 03/06/2015 1307   GLUCOSE 140 (H) 09/30/2024 0835   GLUCOSE 83 03/06/2015 1307   BUN 8 09/30/2024 0835   BUN 5 (L) 03/06/2015 1307   CREATININE 0.82 09/30/2024 0835   CREATININE 0.89 03/06/2015 1307   CALCIUM 8.8 (L) 09/30/2024 0835   CALCIUM 8.8 (L) 03/06/2015 1307   PROT 6.2 (L) 09/30/2024 0835   PROT 7.4 03/06/2015 1307   ALBUMIN  3.1 (L) 09/30/2024 0835   ALBUMIN  4.2 03/06/2015 1307   AST 85 (H) 09/30/2024 0835   ALT 85 (H) 09/30/2024 0835   ALT 14 03/06/2015 1307   ALKPHOS 82 09/30/2024 0835   ALKPHOS 46 03/06/2015 1307   BILITOT 1.0 09/30/2024 0835   GFRNONAA >60 09/30/2024 0835   GFRNONAA >60 03/06/2015 1307    No results found.     ASSESSMENT AND PLAN:  1. Acute deep vein thrombosis (DVT) of other vein of right upper extremity (HCC) (Primary) Resolved venous thromboembolism in the internal jugular and subclavian veins, likely provoked by a misplaced port and exacerbated by active malignancy. Transition to Eliquis considered for lower bleeding risk and better efficacy. Long-term anticoagulation recommended due to ongoing cancer treatment and high risk of recurrence. - Spoke with Dr. Babara directly and she recommends to continue with Lovenox .  She notes she has plan to transition her to Eliquis at a later date when she is done with procedures. - Continue anticoagulation therapy during active cancer treatment.  2. Invasive carcinoma of breast (HCC) In speaking with Dr. Babara she would like to have her port replaced her right chest instead of leaving it in her left PICC line.  This is due to the fact that the patient will be receiving  chemotherapy and a PICC line is a less than optimal place.  I have contacted patient regarding placement left a voicemail so that we can reschedule him.     Current Outpatient Medications on File Prior to Visit  Medication Sig Dispense Refill   albuterol  (VENTOLIN  HFA) 108 (90 Base) MCG/ACT inhaler Inhale 2 puffs into the lungs every 4 (four) hours as needed for wheezing or shortness of breath. 8.5 g 2   ascorbic acid (VITAMIN C) 500 MG tablet Take 500 mg by mouth daily.     benzonatate  (TESSALON ) 200 MG capsule Take 1 capsule (200 mg total) by mouth 3 (three) times daily as needed for cough. 30 capsule 1   BIOTIN PO Take by mouth daily.     budesonide  (PULMICORT ) 0.25 MG/2ML nebulizer solution Take 2 mLs (0.25 mg total) by nebulization 2 (two) times daily. 60 mL 12   busPIRone  (BUSPAR ) 5 MG tablet Take 5 mg by mouth 2 (two) times daily.     cholecalciferol (VITAMIN D3) 25 MCG (1000 UNIT) tablet Take 1,000 Units by mouth daily.     dexamethasone  (DECADRON ) 4 MG tablet Take 2 tablets (8 mg total) by mouth daily for 2 days. Start the day after doxorubicin/cyclophosphamide chemotherapy. Take with food. 30 tablet 1   diphenoxylate -atropine  (LOMOTIL ) 2.5-0.025 MG tablet Take 1 tablet by  mouth 4 (four) times daily. 60 tablet 0   DULoxetine  (CYMBALTA ) 30 MG capsule Take 30 mg by mouth at bedtime.     enoxaparin  (LOVENOX ) 80 MG/0.8ML injection Inject 0.8 mLs (80 mg total) into the skin every 12 (twelve) hours. 48 mL 2   esomeprazole  (NEXIUM ) 40 MG capsule Take 1 capsule (40 mg total) by mouth 2 (two) times daily before a meal. 60 capsule 2   fentaNYL  (DURAGESIC ) 25 MCG/HR Place 1 patch onto the skin every 3 (three) days. 10 patch 0   gabapentin  (NEURONTIN ) 300 MG capsule Take 600 mg by mouth 2 (two) times daily.     HYDROcodone  bit-homatropine (HYCODAN) 5-1.5 MG/5ML syrup Take 5 mLs by mouth every 6 (six) hours as needed for cough. 120 mL 0   hydrocortisone  (ANUSOL -HC) 2.5 % rectal cream Place 1  Application rectally 2 (two) times daily. 90 g 2   ipratropium-albuterol  (DUONEB) 0.5-2.5 (3) MG/3ML SOLN Take 3 mLs by nebulization 4 (four) times daily. 360 mL 3   linaclotide (LINZESS) 145 MCG CAPS capsule Take 145 mcg by mouth daily before breakfast.     LORazepam  (ATIVAN ) 0.5 MG tablet Take 1 tablet (0.5 mg total) by mouth every 12 (twelve) hours as needed for anxiety (nausea). 30 tablet 0   montelukast  (SINGULAIR ) 10 MG tablet Take 1 tablet (10 mg total) by mouth See admin instructions. Take 1 tablet on the day prior to chemotherapy and take 1 tablet daily for 2 days after chemotherapy. 30 tablet 0   Multiple Vitamin (MULTIVITAMIN WITH MINERALS) TABS tablet Take 1 tablet by mouth daily.     nystatin  (MYCOSTATIN ) 100000 UNIT/ML suspension Take 5 mLs (500,000 Units total) by mouth 4 (four) times daily. Swish and swallow 473 mL 1   OLANZapine  (ZYPREXA ) 5 MG tablet Take 1 tablet (5 mg total) by mouth at bedtime. 30 tablet 3   oxyCODONE  (OXY IR/ROXICODONE ) 5 MG immediate release tablet Take 1 tablet (5 mg total) by mouth every 6 (six) hours as needed for severe pain (pain score 7-10) or breakthrough pain. 30 tablet 0   potassium chloride  SA (KLOR-CON  M) 20 MEQ tablet Take 1 tablet (20 mEq total) by mouth 2 (two) times daily. 30 tablet 0   prochlorperazine  (COMPAZINE ) 10 MG tablet Take 1 tablet (10 mg total) by mouth every 6 (six) hours as needed for nausea or vomiting. 30 tablet 1   promethazine  (PHENERGAN ) 25 MG tablet Take 1 tablet (25 mg total) by mouth every 8 (eight) hours as needed for nausea or vomiting. 60 tablet 0   azelastine  (ASTELIN ) 0.1 % nasal spray Place 1 spray into both nostrils 2 (two) times daily. Use in each nostril as directed 30 mL 0   capecitabine  (XELODA ) 500 MG tablet Take 3 tablets (1,500 mg total) by mouth 2 (two) times daily after a meal. Take within 30 minutes after meals. Take only on days when you have radiation. Start on 08/29/24. (Patient not taking: Reported on  09/30/2024) 60 tablet 0   lidocaine  (LIDODERM ) 5 % 1 patch daily. (Patient not taking: Reported on 10/03/2024)     midodrine  (PROAMATINE ) 10 MG tablet Take 1 tablet (10 mg total) by mouth 3 (three) times daily with meals. (Patient not taking: Reported on 09/30/2024) 90 tablet 0   Turmeric (QC TUMERIC COMPLEX PO) Take by mouth daily. (Patient not taking: Reported on 10/03/2024)     No current facility-administered medications on file prior to visit.    There are no Patient Instructions on file  for this visit. No follow-ups on file.   [facility], NP  This note was completed with Office Manager.  Any errors are purely unintentional.

## 2024-10-07 ENCOUNTER — Inpatient Hospital Stay

## 2024-10-08 ENCOUNTER — Telehealth (INDEPENDENT_AMBULATORY_CARE_PROVIDER_SITE_OTHER): Payer: Self-pay

## 2024-10-08 NOTE — Telephone Encounter (Signed)
 Spoke with the patient and she is scheduled with Dr. Marea on 10/14/24 with a 10:45 am arrival time to the [facility] for a port placement. Pre-procedure instructions were discussed and will be sent to Mychart.

## 2024-10-09 ENCOUNTER — Inpatient Hospital Stay

## 2024-10-09 ENCOUNTER — Ambulatory Visit
Admission: RE | Admit: 2024-10-09 | Discharge: 2024-10-09 | Disposition: A | Discharge status: Home / Self Care | Source: Ambulatory Visit | Attending: Oncology | Admitting: Oncology

## 2024-10-09 ENCOUNTER — Encounter: Payer: Self-pay | Admitting: Nurse Practitioner

## 2024-10-09 DIAGNOSIS — Z79899 Other long term (current) drug therapy: Secondary | ICD-10-CM | POA: Insufficient documentation

## 2024-10-09 DIAGNOSIS — Z5181 Encounter for therapeutic drug level monitoring: Secondary | ICD-10-CM | POA: Diagnosis present

## 2024-10-09 DIAGNOSIS — R011 Cardiac murmur, unspecified: Secondary | ICD-10-CM | POA: Insufficient documentation

## 2024-10-09 DIAGNOSIS — R0600 Dyspnea, unspecified: Secondary | ICD-10-CM | POA: Insufficient documentation

## 2024-10-09 LAB — ECHOCARDIOGRAM COMPLETE
AR max vel: 1.97 cm2
AV Area VTI: 2.33 cm2
AV Area mean vel: 1.99 cm2
AV Mean grad: 7 mmHg
AV Peak grad: 12.1 mmHg
Ao pk vel: 1.74 m/s
Area-P 1/2: 4.01 cm2
MV VTI: 3.46 cm2
S' Lateral: 2.9 cm

## 2024-10-09 NOTE — Progress Notes (Signed)
*  PRELIMINARY RESULTS* Echocardiogram 2D Echocardiogram has been performed.  Monica Gonzalez 10/09/2024, 9:48 AM

## 2024-10-14 ENCOUNTER — Encounter
Admission: RE | Disposition: A | Discharge status: Home / Self Care | Payer: Self-pay | Source: Home / Self Care | Attending: Vascular Surgery

## 2024-10-14 ENCOUNTER — Other Ambulatory Visit: Payer: Self-pay

## 2024-10-14 ENCOUNTER — Encounter: Payer: Self-pay | Admitting: Vascular Surgery

## 2024-10-14 ENCOUNTER — Ambulatory Visit
Admission: RE | Admit: 2024-10-14 | Discharge: 2024-10-14 | Disposition: A | Discharge status: Home / Self Care | Attending: Vascular Surgery | Admitting: Vascular Surgery

## 2024-10-14 ENCOUNTER — Inpatient Hospital Stay: Attending: Oncology

## 2024-10-14 ENCOUNTER — Inpatient Hospital Stay

## 2024-10-14 DIAGNOSIS — M797 Fibromyalgia: Secondary | ICD-10-CM | POA: Insufficient documentation

## 2024-10-14 DIAGNOSIS — Z1722 Progesterone receptor negative status: Secondary | ICD-10-CM | POA: Insufficient documentation

## 2024-10-14 DIAGNOSIS — Z923 Personal history of irradiation: Secondary | ICD-10-CM | POA: Insufficient documentation

## 2024-10-14 DIAGNOSIS — C50919 Malignant neoplasm of unspecified site of unspecified female breast: Secondary | ICD-10-CM | POA: Insufficient documentation

## 2024-10-14 DIAGNOSIS — K521 Toxic gastroenteritis and colitis: Secondary | ICD-10-CM | POA: Insufficient documentation

## 2024-10-14 DIAGNOSIS — R300 Dysuria: Secondary | ICD-10-CM | POA: Insufficient documentation

## 2024-10-14 DIAGNOSIS — Z79630 Long term (current) use of alkylating agent: Secondary | ICD-10-CM | POA: Insufficient documentation

## 2024-10-14 DIAGNOSIS — C210 Malignant neoplasm of anus, unspecified: Secondary | ICD-10-CM | POA: Diagnosis present

## 2024-10-14 DIAGNOSIS — K219 Gastro-esophageal reflux disease without esophagitis: Secondary | ICD-10-CM | POA: Insufficient documentation

## 2024-10-14 DIAGNOSIS — Z87891 Personal history of nicotine dependence: Secondary | ICD-10-CM | POA: Insufficient documentation

## 2024-10-14 DIAGNOSIS — G893 Neoplasm related pain (acute) (chronic): Secondary | ICD-10-CM | POA: Insufficient documentation

## 2024-10-14 DIAGNOSIS — Z832 Family history of diseases of the blood and blood-forming organs and certain disorders involving the immune mechanism: Secondary | ICD-10-CM | POA: Insufficient documentation

## 2024-10-14 DIAGNOSIS — Z79632 Long term (current) use of antitumor antibiotic: Secondary | ICD-10-CM | POA: Insufficient documentation

## 2024-10-14 DIAGNOSIS — Z7952 Long term (current) use of systemic steroids: Secondary | ICD-10-CM | POA: Insufficient documentation

## 2024-10-14 DIAGNOSIS — Z5189 Encounter for other specified aftercare: Secondary | ICD-10-CM | POA: Insufficient documentation

## 2024-10-14 DIAGNOSIS — D701 Agranulocytosis secondary to cancer chemotherapy: Secondary | ICD-10-CM | POA: Insufficient documentation

## 2024-10-14 DIAGNOSIS — Z86718 Personal history of other venous thrombosis and embolism: Secondary | ICD-10-CM | POA: Insufficient documentation

## 2024-10-14 DIAGNOSIS — R011 Cardiac murmur, unspecified: Secondary | ICD-10-CM | POA: Insufficient documentation

## 2024-10-14 DIAGNOSIS — C50411 Malignant neoplasm of upper-outer quadrant of right female breast: Secondary | ICD-10-CM | POA: Insufficient documentation

## 2024-10-14 DIAGNOSIS — K760 Fatty (change of) liver, not elsewhere classified: Secondary | ICD-10-CM | POA: Insufficient documentation

## 2024-10-14 DIAGNOSIS — T451X5A Adverse effect of antineoplastic and immunosuppressive drugs, initial encounter: Secondary | ICD-10-CM | POA: Insufficient documentation

## 2024-10-14 DIAGNOSIS — Z171 Estrogen receptor negative status [ER-]: Secondary | ICD-10-CM | POA: Insufficient documentation

## 2024-10-14 DIAGNOSIS — M129 Arthropathy, unspecified: Secondary | ICD-10-CM | POA: Insufficient documentation

## 2024-10-14 DIAGNOSIS — E876 Hypokalemia: Secondary | ICD-10-CM | POA: Insufficient documentation

## 2024-10-14 DIAGNOSIS — D1803 Hemangioma of intra-abdominal structures: Secondary | ICD-10-CM | POA: Insufficient documentation

## 2024-10-14 DIAGNOSIS — Z7901 Long term (current) use of anticoagulants: Secondary | ICD-10-CM | POA: Insufficient documentation

## 2024-10-14 DIAGNOSIS — Z59868 Other specified financial insecurity: Secondary | ICD-10-CM | POA: Insufficient documentation

## 2024-10-14 DIAGNOSIS — Z7951 Long term (current) use of inhaled steroids: Secondary | ICD-10-CM | POA: Insufficient documentation

## 2024-10-14 DIAGNOSIS — Z87412 Personal history of vulvar dysplasia: Secondary | ICD-10-CM | POA: Insufficient documentation

## 2024-10-14 DIAGNOSIS — D649 Anemia, unspecified: Secondary | ICD-10-CM | POA: Insufficient documentation

## 2024-10-14 DIAGNOSIS — M1990 Unspecified osteoarthritis, unspecified site: Secondary | ICD-10-CM | POA: Insufficient documentation

## 2024-10-14 DIAGNOSIS — Z5111 Encounter for antineoplastic chemotherapy: Secondary | ICD-10-CM | POA: Insufficient documentation

## 2024-10-14 DIAGNOSIS — J45909 Unspecified asthma, uncomplicated: Secondary | ICD-10-CM | POA: Insufficient documentation

## 2024-10-14 DIAGNOSIS — Z1732 Human epidermal growth factor receptor 2 negative status: Secondary | ICD-10-CM | POA: Insufficient documentation

## 2024-10-14 DIAGNOSIS — Z87442 Personal history of urinary calculi: Secondary | ICD-10-CM | POA: Insufficient documentation

## 2024-10-14 DIAGNOSIS — Z79899 Other long term (current) drug therapy: Secondary | ICD-10-CM | POA: Insufficient documentation

## 2024-10-14 DIAGNOSIS — M5030 Other cervical disc degeneration, unspecified cervical region: Secondary | ICD-10-CM | POA: Insufficient documentation

## 2024-10-14 HISTORY — PX: PORTA CATH INSERTION: CATH118285

## 2024-10-14 HISTORY — PX: PORTA CATH REMOVAL: CATH118286

## 2024-10-14 SURGERY — PORTA CATH INSERTION
Anesthesia: Moderate Sedation

## 2024-10-14 MED ORDER — DIPHENHYDRAMINE HCL 50 MG/ML IJ SOLN: 50.0000 mg | Freq: Once | INTRAMUSCULAR | Status: DC | PRN

## 2024-10-14 MED ORDER — LIDOCAINE-EPINEPHRINE (PF) 1 %-1:200000 IJ SOLN
INTRAMUSCULAR | Status: DC | PRN
  Administered 2024-10-14: 20 mL

## 2024-10-14 MED ORDER — METHYLPREDNISOLONE SODIUM SUCC 125 MG IJ SOLR: 125.0000 mg | Freq: Once | INTRAMUSCULAR | Status: DC | PRN

## 2024-10-14 MED ORDER — FENTANYL CITRATE (PF) 50 MCG/ML IJ SOSY
PREFILLED_SYRINGE | INTRAMUSCULAR | Status: AC
  Filled 2024-10-14: qty 1

## 2024-10-14 MED ORDER — MIDAZOLAM HCL (PF) 2 MG/2ML IJ SOLN
INTRAMUSCULAR | Status: DC | PRN
  Administered 2024-10-14 (×2): .5 mg via INTRAVENOUS
  Administered 2024-10-14: 2 mg via INTRAVENOUS
  Administered 2024-10-14: 1 mg via INTRAVENOUS

## 2024-10-14 MED ORDER — HYDROMORPHONE HCL 1 MG/ML IJ SOLN: 1.0000 mg | Freq: Once | INTRAMUSCULAR | Status: DC | PRN

## 2024-10-14 MED ORDER — FAMOTIDINE 20 MG PO TABS: 40.0000 mg | ORAL_TABLET | Freq: Once | ORAL | Status: DC | PRN

## 2024-10-14 MED ORDER — ONDANSETRON HCL 4 MG/2ML IJ SOLN: 4.0000 mg | Freq: Four times a day (QID) | INTRAMUSCULAR | Status: DC | PRN

## 2024-10-14 MED ORDER — HEPARIN (PORCINE) IN NACL 1000-0.9 UT/500ML-% IV SOLN
INTRAVENOUS | Status: DC | PRN
  Administered 2024-10-14: 500 mL

## 2024-10-14 MED ORDER — MIDAZOLAM HCL 2 MG/ML PO SYRP: 8.0000 mg | ORAL_SOLUTION | Freq: Once | ORAL | Status: DC | PRN

## 2024-10-14 MED ORDER — MIDAZOLAM HCL 2 MG/2ML IJ SOLN
INTRAMUSCULAR | Status: AC
  Filled 2024-10-14: qty 2

## 2024-10-14 MED ORDER — FENTANYL CITRATE (PF) 100 MCG/2ML IJ SOLN
INTRAMUSCULAR | Status: DC | PRN
  Administered 2024-10-14: 25 ug via INTRAVENOUS
  Administered 2024-10-14: 50 ug via INTRAVENOUS
  Administered 2024-10-14 (×2): 12.5 ug via INTRAVENOUS

## 2024-10-14 MED ORDER — CEFAZOLIN SODIUM-DEXTROSE 2-4 GM/100ML-% IV SOLN
INTRAVENOUS | Status: AC
  Filled 2024-10-14: qty 100

## 2024-10-14 MED ORDER — SODIUM CHLORIDE 0.9 % IV SOLN: INTRAVENOUS | Status: DC

## 2024-10-14 MED ORDER — SODIUM CHLORIDE 0.9 % IV SOLN
80.0000 mg | Freq: Once | INTRAVENOUS | Status: DC
  Filled 2024-10-14: qty 2

## 2024-10-14 MED ORDER — CEFAZOLIN SODIUM-DEXTROSE 2-4 GM/100ML-% IV SOLN
2.0000 g | INTRAVENOUS | Status: AC
  Administered 2024-10-14: 2 g via INTRAVENOUS

## 2024-10-14 SURGICAL SUPPLY — 12 items
"PENCIL ELECTRO HAND CTR " (MISCELLANEOUS) IMPLANT
COVER PROBE ULTRASOUND 5X96 (MISCELLANEOUS) IMPLANT
DERMABOND ADVANCED .7 DNX12 (GAUZE/BANDAGES/DRESSINGS) IMPLANT
HANDLE YANKAUER SUCT BULB TIP (MISCELLANEOUS) IMPLANT
KIT MICROPUNCTURE VSI 5F STIFF (SHEATH) IMPLANT
KIT PORT POWER 8FR ISP CVUE (Port) IMPLANT
NDL ENTRY 21GA 7CM ECHOTIP (NEEDLE) IMPLANT
PACK ANGIOGRAPHY (CUSTOM PROCEDURE TRAY) ×1 IMPLANT
PENCIL ELECTRO HAND CTR (MISCELLANEOUS) IMPLANT
SUT MNCRL AB 4-0 PS2 18 (SUTURE) IMPLANT
SUT VIC AB 3-0 SH 27X BRD (SUTURE) IMPLANT
TUBING CONNECTING 10 (TUBING) IMPLANT

## 2024-10-14 NOTE — Progress Notes (Signed)
 Fentanyl  patch on left arm removed with permission from patient.

## 2024-10-14 NOTE — Op Note (Signed)
      [facility]       Operative Note  Date: 10/14/2024  Preoperative diagnosis:  1. Anal cancer, breast cancer, previous right sided line with DVT  Postoperative diagnosis:  Same as above  Procedures: #1. Ultrasound guidance for vascular access to the left internal jugular vein. #2. Fluoroscopic guidance for placement of catheter. #3. Placement of CT compatible Port-A-Cath, left internal jugular vein.  Surgeon: Selinda Gu, MD.   Anesthesia: Local with moderate conscious sedation for approximately 45  minutes using 4 mg of Versed  and 100 mcg of Fentanyl   Fluoroscopy time: less than 1 minute  Contrast used: 0  Estimated blood loss: 5 cc  Indication for the procedure:  The patient is a 48 y.o.female with  breast and anal cancer.  The patient needs a Port-A-Cath for durable venous access, chemotherapy, lab draws, and CT scans. We are asked to place this. Risks and benefits were discussed and informed consent was obtained.  Description of procedure: The patient was brought to the vascular and interventional radiology suite.  Moderate conscious sedation was administered throughout the procedure during a face to face encounter with the patient with my supervision of the RN administering medicines and monitoring the patient's vital signs, pulse oximetry, telemetry and mental status throughout from the start of the procedure until the patient was taken to the recovery room. The left neck chest and shoulder were sterilely prepped and draped, and a sterile surgical field was created. Ultrasound was used to help visualize a patent left internal jugular vein. This was then accessed under direct ultrasound guidance without difficulty with the Seldinger needle and a permanent image was recorded. A J-wire was placed. After skin nick and dilatation, the peel-away sheath was then placed over the wire. I then anesthetized an area under the clavicle approximately 1-2 fingerbreadths. A  transverse incision was created and an inferior pocket was created with electrocautery and blunt dissection. The port was then brought onto the field, placed into the pocket and secured to the chest wall with 2 Prolene sutures. The catheter was connected to the port and tunneled from the subclavicular incision to the access site. Fluoroscopic guidance was then used to cut the catheter to an appropriate length. The catheter was then placed through the peel-away sheath and the peel-away sheath was removed. The catheter tip was parked in excellent location under fluorocoscopic guidance in the cavoatrial junction. The pocket was then irrigated with antibiotic impregnated saline and the wound was closed with a running 3-0 Vicryl and a 4-0 Monocryl. The access incision was closed with a single 4-0 Monocryl. The Huber needle was used to withdraw blood and flush the port with heparinized saline. Dermabond was then placed as a dressing. The patient tolerated the procedure well and was taken to the recovery room in stable condition.   Selinda Gu 10/14/2024 12:27 PM   This note was created with Dragon Medical transcription system. Any errors in dictation are purely unintentional.

## 2024-10-14 NOTE — Discharge Instructions (Signed)
 Implanted [facility] Guide  An implanted port is a type of central line that is placed under the skin. Central lines are used to provide IV access when treatment or nutrition needs to be given through a person's veins. Implanted ports are used for long-term IV access. An implanted port may be placed because: You need IV medicine that would be irritating to the small veins in your hands or arms. You need long-term IV medicines, such as antibiotics. You need IV nutrition for a long period. You need frequent blood draws for lab tests. You need dialysis.   Implanted ports are usually placed in the chest area, but they can also be placed in the upper arm, the abdomen, or the leg. An implanted port has two main parts: Reservoir. The reservoir is round and will appear as a small, raised area under your skin. The reservoir is the part where a needle is inserted to give medicines or draw blood. Catheter. The catheter is a thin, flexible tube that extends from the reservoir. The catheter is placed into a large vein. Medicine that is inserted into the reservoir goes into the catheter and then into the vein.   How will I care for my incision  You may shower tomorrow Please remove dressing in 24hrs not other skin care is needed  How is my port accessed? Special steps must be taken to access the port: Before the port is accessed, a numbing cream can be placed on the skin. This helps numb the skin over the port site. Your health care provider uses a sterile technique to access the port. Your health care provider must put on a mask and sterile gloves. The skin over your port is cleaned carefully with an antiseptic and allowed to dry. The port is gently pinched between sterile gloves, and a needle is inserted into the port. Only "non-coring" port needles should be used to access the port. Once the port is accessed, a blood return should be checked. This helps ensure that the port is in the vein and is not  clogged. If your port needs to remain accessed for a constant infusion, a clear (transparent) bandage will be placed over the needle site. The bandage and needle will need to be changed every week, or as directed by your health care provider.   What is flushing? Flushing helps keep the port from getting clogged. Follow your health care provider's instructions on how and when to flush the port. Ports are usually flushed with saline solution or a medicine called heparin. The need for flushing will depend on how the port is used. If the port is used for intermittent medicines or blood draws, the port will need to be flushed: After medicines have been given. After blood has been drawn. As part of routine maintenance. If a constant infusion is running, the port may not need to be flushed.   How long will my port stay implanted? The port can stay in for as long as your health care provider thinks it is needed. When it is time for the port to come out, surgery will be done to remove it. The procedure is similar to the one performed when the port was put in. When should I seek immediate medical care? When you have an implanted port, you should seek immediate medical care if: You notice a bad smell coming from the incision site. You have swelling, redness, or drainage at the incision site. You have more swelling or pain at the  port site or the surrounding area. You have a fever that is not controlled with medicine.   This information is not intended to replace advice given to you by your health care provider. Make sure you discuss any questions you have with your health care provider. Document Released: 10/31/2005 Document Revised: 04/07/2016 Document Reviewed: 07/08/2013 Elsevier Interactive Patient Education  2017 [employer].

## 2024-10-14 NOTE — Interval H&P Note (Signed)
 History and Physical Interval Note:  10/14/2024 10:04 AM  Monica Gonzalez  has presented today for surgery, with the diagnosis of Porta Cath Insertion   Anal squamous cell Ca.  The various methods of treatment have been discussed with the patient and family. After consideration of risks, benefits and other options for treatment, the patient has consented to  Procedure(s): PORTA CATH INSERTION (N/A) PORTA CATH REMOVAL (N/A) as a surgical intervention.  The patient's history has been reviewed, patient examined, no change in status, stable for surgery.  I have reviewed the patient's chart and labs.  Questions were answered to the patient's satisfaction.     Nachmen Mansel

## 2024-10-15 ENCOUNTER — Encounter (INDEPENDENT_AMBULATORY_CARE_PROVIDER_SITE_OTHER): Payer: Self-pay

## 2024-10-15 ENCOUNTER — Ambulatory Visit: Admitting: Radiation Oncology

## 2024-10-15 ENCOUNTER — Other Ambulatory Visit: Payer: Self-pay | Admitting: Oncology

## 2024-10-15 ENCOUNTER — Encounter: Payer: Self-pay | Admitting: Oncology

## 2024-10-16 ENCOUNTER — Inpatient Hospital Stay

## 2024-10-16 ENCOUNTER — Inpatient Hospital Stay: Attending: Oncology | Admitting: Hospice and Palliative Medicine

## 2024-10-16 ENCOUNTER — Encounter: Payer: Self-pay | Admitting: Hospice and Palliative Medicine

## 2024-10-16 ENCOUNTER — Other Ambulatory Visit: Payer: Self-pay

## 2024-10-16 VITALS — BP 116/78 | HR 98 | Temp 96.3°F | Resp 16 | Ht 59.0 in | Wt 187.0 lb

## 2024-10-16 DIAGNOSIS — Z79891 Long term (current) use of opiate analgesic: Secondary | ICD-10-CM

## 2024-10-16 DIAGNOSIS — C210 Malignant neoplasm of anus, unspecified: Secondary | ICD-10-CM

## 2024-10-16 DIAGNOSIS — M129 Arthropathy, unspecified: Secondary | ICD-10-CM | POA: Diagnosis not present

## 2024-10-16 DIAGNOSIS — J45909 Unspecified asthma, uncomplicated: Secondary | ICD-10-CM | POA: Diagnosis not present

## 2024-10-16 DIAGNOSIS — Z1732 Human epidermal growth factor receptor 2 negative status: Secondary | ICD-10-CM | POA: Diagnosis not present

## 2024-10-16 DIAGNOSIS — M1990 Unspecified osteoarthritis, unspecified site: Secondary | ICD-10-CM | POA: Diagnosis not present

## 2024-10-16 DIAGNOSIS — M797 Fibromyalgia: Secondary | ICD-10-CM | POA: Diagnosis not present

## 2024-10-16 DIAGNOSIS — R300 Dysuria: Secondary | ICD-10-CM | POA: Diagnosis not present

## 2024-10-16 DIAGNOSIS — Z95828 Presence of other vascular implants and grafts: Secondary | ICD-10-CM | POA: Diagnosis not present

## 2024-10-16 DIAGNOSIS — G893 Neoplasm related pain (acute) (chronic): Secondary | ICD-10-CM | POA: Diagnosis not present

## 2024-10-16 DIAGNOSIS — Z86718 Personal history of other venous thrombosis and embolism: Secondary | ICD-10-CM | POA: Diagnosis not present

## 2024-10-16 DIAGNOSIS — Z5189 Encounter for other specified aftercare: Secondary | ICD-10-CM | POA: Diagnosis not present

## 2024-10-16 DIAGNOSIS — K219 Gastro-esophageal reflux disease without esophagitis: Secondary | ICD-10-CM | POA: Diagnosis not present

## 2024-10-16 DIAGNOSIS — T451X5A Adverse effect of antineoplastic and immunosuppressive drugs, initial encounter: Secondary | ICD-10-CM | POA: Diagnosis not present

## 2024-10-16 DIAGNOSIS — M5030 Other cervical disc degeneration, unspecified cervical region: Secondary | ICD-10-CM | POA: Diagnosis not present

## 2024-10-16 DIAGNOSIS — D701 Agranulocytosis secondary to cancer chemotherapy: Secondary | ICD-10-CM | POA: Diagnosis not present

## 2024-10-16 DIAGNOSIS — R011 Cardiac murmur, unspecified: Secondary | ICD-10-CM | POA: Diagnosis not present

## 2024-10-16 DIAGNOSIS — K521 Toxic gastroenteritis and colitis: Secondary | ICD-10-CM | POA: Diagnosis not present

## 2024-10-16 DIAGNOSIS — C50411 Malignant neoplasm of upper-outer quadrant of right female breast: Secondary | ICD-10-CM | POA: Diagnosis not present

## 2024-10-16 DIAGNOSIS — Z1722 Progesterone receptor negative status: Secondary | ICD-10-CM | POA: Diagnosis not present

## 2024-10-16 DIAGNOSIS — Z5111 Encounter for antineoplastic chemotherapy: Secondary | ICD-10-CM | POA: Diagnosis not present

## 2024-10-16 DIAGNOSIS — K760 Fatty (change of) liver, not elsewhere classified: Secondary | ICD-10-CM | POA: Diagnosis not present

## 2024-10-16 DIAGNOSIS — E876 Hypokalemia: Secondary | ICD-10-CM | POA: Diagnosis not present

## 2024-10-16 DIAGNOSIS — D649 Anemia, unspecified: Secondary | ICD-10-CM | POA: Diagnosis not present

## 2024-10-16 DIAGNOSIS — D1803 Hemangioma of intra-abdominal structures: Secondary | ICD-10-CM | POA: Diagnosis not present

## 2024-10-16 DIAGNOSIS — Z171 Estrogen receptor negative status [ER-]: Secondary | ICD-10-CM | POA: Diagnosis not present

## 2024-10-16 MED ORDER — NALOXONE HCL 4 MG/0.1ML NA LIQD: NASAL | 0 refills | Status: AC

## 2024-10-16 MED ORDER — LIDOCAINE-PRILOCAINE 2.5-2.5 % EX CREA: 1.0000 | TOPICAL_CREAM | CUTANEOUS | 3 refills | Status: AC | PRN

## 2024-10-16 MED ORDER — OXYCODONE HCL 5 MG PO TABS: 5.0000 mg | ORAL_TABLET | Freq: Four times a day (QID) | ORAL | 0 refills | Status: DC | PRN

## 2024-10-16 NOTE — Progress Notes (Signed)
 Symptom Management Clinic [facility] [phone] [phone]  Patient Care Team: Epifanio Alm SQUIBB, MD as PCP - General (Infectious Diseases) Maurie Rayfield BIRCH, RN as Oncology Nurse Navigator Babara Call, MD as Consulting Physician (Oncology) Lenn Aran, MD as Consulting Physician (Radiation Oncology) Georgina Shasta POUR, RN as Oncology Nurse Navigator   NAME OF PATIENT: Monica Gonzalez  [id number]  May 05, 1976   DATE OF VISIT: 10/16/24  REASON FOR CONSULT: Monica Gonzalez is a 48 y.o. 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    INTERVAL HISTORY: Patient had port placed on 10/14/2024.  Presents to [facility] today with complaint of pain at port site.  She says that she lost her oxycodone  in the process of moving houses.  Says she changed her fentanyl  patch last night but took a shower this morning and it is no longer applied.  Does not feel that fentanyl  is helping.  States that oxycodone  was helping improve pain.  Denies any adverse effects from pain medications.  Denies any illicit use or misuse.  Patient says that her rectal pain has resolved.  Denies any neurologic complaints. Denies recent fevers or illnesses. Denies any easy bleeding or bruising. Reports good appetite and denies weight loss. Denies shortness of breath chest pain. Denies any nausea, vomiting, constipation, or diarrhea. Denies urinary complaints. Patient offers no further specific complaints today.   PAST MEDICAL HISTORY: Past Medical History:  Diagnosis Date   Anemia    Anginal pain    Anxiety    Arthritis    Asthma    Cancer (HCC)    rectal cancer, breast cancer 2025   Chronic pain syndrome 05/21/2023   DDD (degenerative disc disease), cervical 05/22/2023   Deep venous thrombosis (HCC)    Depression    Dyspnea     Fibromyalgia    GERD (gastroesophageal reflux disease)    Headache    Heart murmur    History of hiatal hernia    History of kidney stones    Lupus    Malignant neoplasm of upper-outer quadrant of right female breast, unspecified estrogen receptor status (HCC) 2025   Pneumonia    PTSD (post-traumatic stress disorder)    Skin candidiasis 05/29/2024   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    PAST SURGICAL HISTORY:  Past Surgical History:  Procedure Laterality Date   BREAST BIOPSY Right 06/18/2024   US  RT BREAST BX W LOC DEV 1ST LESION IMG BX SPEC US  GUIDE 06/18/2024 [facility]   BREAST BIOPSY Right 06/18/2024   US  RT BREAST BX W LOC DEV EA ADD LESION IMG BX SPEC US  GUIDE 06/18/2024 [facility]   BREAST BIOPSY Right 06/18/2024   US  RT BREAST BX W LOC DEV EA ADD LESION IMG BX SPEC US  GUIDE 06/18/2024 [facility]   BREAST BIOPSY Right 07/05/2024   US  RT BREAST SAVI/RF TAG 1ST LESION US  GUIDE 07/05/2024 [facility]   BREAST BIOPSY Right 07/05/2024   US  RT BREAST SAVI/RF TAG EA ADD'L LESION US  GUIDE 07/05/2024 [facility]   BREAST LUMPECTOMY Right 07/25/2024   Procedure: BREAST LUMPECTOMY;  Surgeon: Tye Millet, DO;  Location: [facility];  Service: General;  Laterality: Right;   BREAST LUMPECTOMY WITH RADIO FREQUENCY LOCALIZER Right 07/08/2024   Procedure: BREAST LUMPECTOMY WITH RADIO FREQUENCY LOCALIZER;  Surgeon: Tye Millet, DO;  Location: [facility];  Service: General;  Laterality: Right;  COLONOSCOPY WITH PROPOFOL  N/A 02/17/2023   Procedure: COLONOSCOPY WITH PROPOFOL ;  Surgeon: Maryruth Ole DASEN, MD;  Location: [facility] ENDOSCOPY;  Service: Endoscopy;  Laterality: N/A;   ESOPHAGOGASTRODUODENOSCOPY (EGD) WITH PROPOFOL  N/A 02/17/2023   Procedure: ESOPHAGOGASTRODUODENOSCOPY (EGD) WITH PROPOFOL ;  Surgeon: Maryruth Ole DASEN, MD;  Location: [facility] ENDOSCOPY;  Service: Endoscopy;  Laterality: N/A;   EXTRACORPOREAL SHOCK WAVE LITHOTRIPSY     IR CV LINE  INJECTION  08/21/2024   IR REMOVAL TUN ACCESS W/ PORT W/O FL MOD SED  08/28/2024   PICC LINE INSERTION N/A 08/2024   PORTA CATH INSERTION N/A 10/14/2024   Procedure: PORTA CATH INSERTION;  Surgeon: Marea Selinda RAMAN, MD;  Location: [facility];  Service: Cardiovascular;  Laterality: N/A;   PORTA CATH REMOVAL N/A 10/14/2024   Procedure: PORTA CATH REMOVAL;  Surgeon: Marea Selinda RAMAN, MD;  Location: [facility]AS[facility] LAB;  Service: Cardiovascular;  Laterality: N/A;   PORTACATH PLACEMENT Right 07/08/2024   Procedure: INSERTION, TUNNELED CENTRAL VENOUS DEVICE, WITH PORT;  Surgeon: Tye Millet, DO;  Location: [facility];  Service: General;  Laterality: Right;   RECTAL EXAM UNDER ANESTHESIA N/A 05/16/2024   Procedure: EXAM UNDER ANESTHESIA, RECTUM, INCISIONAL BIOPSY OF ANAL MASS;  Surgeon: Tye Millet, DO;  Location: [facility];  Service: General;  Laterality: N/A;   SENTINEL NODE BIOPSY Right 07/08/2024   Procedure: BIOPSY, LYMPH NODE, SENTINEL;  Surgeon: Tye Millet, DO;  Location: [facility];  Service: General;  Laterality: Right;   TONSILLECTOMY     TUBAL LIGATION     VULVECTOMY N/A 07/29/2020   Procedure: WIDE EXCISION VULVECTOMY;  Surgeon: Mancil Barter, MD;  Location: [facility];  Service: Gynecology;  Laterality: N/A;    HEMATOLOGY/ONCOLOGY HISTORY:  Oncology History  Anal squamous cell carcinoma (HCC)  05/29/2024 Initial Diagnosis   Anal squamous cell carcinoma   Patient has noticed a hard mass at the anal area which she initially thought was a hemorrhoid.  The mass has been present for more than a month and appears to be enlarging. Patient was referred to establish care with surgeon Dr. Tye  05/16/2024, incisional biopsy of anal mass showed 1. Anus, biopsy, mass :       - INVASIVE MODERATELY DIFFERENTIATED SQUAMOUS CELL CARCINOMA  06/19/2024, left inguinal adenopathy biopsy showed Metastatic moderately differentiated squamous cell carcinoma.  '    05/29/2024 Cancer Staging   Staging  form: Anus, AJCC V9 - Clinical stage from 05/29/2024: Stage IIIA (cT3, cN1a, cM0) - Signed by Babara Call, MD on 06/26/2024 Stage prefix: Initial diagnosis   06/05/2024 Imaging   CT chest abdomen pelvis with contrast  Slight thickening in the area of the anal canal. Please correlate for history of neoplasm. There is a pathologic enlarged left inguinal node. There is a borderline right-sided node.   No additional areas of abnormal nodal enlargement or other aggressive appearing mass lesion at this time.   Fatty liver infiltration with known segment 7 hepatic hemangioma.   Of note there is a right-sided upper breast mass which was not clearly seen on prior CT scan. Please correlate for any prior study or if needed diagnostic mammographic evaluation and possible ultrasound when appropriate.     06/12/2024 Imaging   CT chest abdomen pelvis with contrast showed Slight thickening in the area of the anal canal. Please correlate for history of neoplasm. There is a pathologic enlarged left inguinal node. There is a borderline right-sided node.   No additional areas of abnormal nodal enlargement or other aggressive  appearing mass lesion at this time.   Fatty liver infiltration with known segment 7 hepatic hemangioma.   Of note there is a right-sided upper breast mass which was not clearly seen on prior CT scan. Please correlate for any prior study or if needed diagnostic mammographic evaluation and possible ultrasound when appropriate.   06/19/2024 Procedure   1. Lymph node, biopsy, left inguinal adenopathy :       -  METASTATIC MODERATELY DIFFERENTIATED SQUAMOUS CELL CARCINOMA    07/01/2024 Imaging   PET scan showed 1. Hypermetabolic anal mass with hypermetabolic left inguinal adenopathy, compatible with anorectal carcinoma. 2. Hypermetabolic right breast nodule with recent biopsies on 06/18/2024. Please correlate with pathology report.   07/31/2024 - 07/31/2024 Chemotherapy   Patient is  on Treatment Plan : ANUS Mitomycin  D1,28 + 5FU D1-4, 28-31 q32d     08/23/2024 Genetic Testing   Negative genetic testing. No pathogenic variants identified on the [facility] CancerNext-Expanded+RNA Panel. The report date is 08/23/2024.  The CancerNext-Expanded gene panel offered by [facility] and includes sequencing, rearrangement, and RNA analysis for the following 77 genes: AIP, ALK, APC, ATM, AXIN2, BAP1, BARD1, BMPR1A, BRCA1, BRCA2, BRIP1, CDC73, CDH1, CDK4, CDKN1B, CDKN2A, CEBPA, CHEK2, CTNNA1, DDX41, DICER1, ETV6, FH, FLCN, GATA2, LZTR1, MAX, MBD4, MEN1, MET, MLH1, MSH2, MSH3, MSH6, MUTYH, NF1, NF2, NTHL1, PALB2, PHOX2B, PMS2, POT1, PRKAR1A, PTCH1, PTEN, RAD51C, RAD51D, RB1, RET, RPS20, RUNX1, SDHA, SDHAF2, SDHB, SDHC, SDHD, SMAD4, SMARCA4, SMARCB1, SMARCE1, STK11, SUFU, TMEM127, TP53, TSC1, TSC2, VHL, and WT1 (sequencing and deletion/duplication); EGFR, HOXB13, KIT, MITF, PDGFRA, POLD1, and POLE (sequencing only); EPCAM and GREM1 (deletion/duplication only).    08/29/2024 - 08/29/2024 Chemotherapy   Patient is on Treatment Plan : ANUS Mitomycin  D1,29 + Capecitabine  + XRT     Invasive carcinoma of breast (HCC)  06/12/2024 Imaging   CT chest abdomen pelvis with contrast showed Slight thickening in the area of the anal canal. Please correlate for history of neoplasm. There is a pathologic enlarged left inguinal node. There is a borderline right-sided node.   No additional areas of abnormal nodal enlargement or other aggressive appearing mass lesion at this time.   Fatty liver infiltration with known segment 7 hepatic hemangioma.   Of note there is a right-sided upper breast mass which was not clearly seen on prior CT scan. Please correlate for any prior study or if needed diagnostic mammographic evaluation and possible ultrasound when appropriate.   06/17/2024 Mammogram   1. There is a suspicious 17 mm mass at the site of CT and mammographic concern in the RIGHT breast. It demonstrates  associated pleomorphic calcifications. Recommend ultrasound-guided biopsy for definitive characterization. 2. There is incidental sonographic note of a 7 mm mass in the RIGHT breast at 12 o'clock and a 6 mm non mass area at 9 o'clock. Recommend ultrasound-guided biopsy of these 2 areas for definitive characterization given suspicious appearance of the dominant mass. 3. No suspicious RIGHT axillary adenopathy. 4. No mammographic evidence of malignancy in the LEFT breast.   06/18/2024 Cancer Staging   Staging form: Breast, AJCC 8th Edition - Clinical stage from 06/18/2024: Stage IB (cT1b, cN0, cM0, G3, ER-, PR-, HER2-) - Signed by Babara Call, MD on 06/26/2024 Stage prefix: Initial diagnosis Histologic grading system: 3 grade system   06/18/2024 Initial Diagnosis   Invasive carcinoma of breast [facility])  Patient with right breast biopsy.  Pathology showed 1. Breast, right, needle core biopsy, 12:00 12cmfn (heart clip) :      INVASIVE DUCTAL CARCINOMA  TUBULE FORMATION: SCORE 3      NUCLEAR PLEOMORPHISM: SCORE 3      MITOTIC COUNT: SCORE 2      TOTAL SCORE: 8      OVERALL GRADE: 3      LYMPHOVASCULAR INVASION: NOT IDENTIFIED      CANCER LENGTH: 1.0 CM      CALCIFICATIONS: NOT IDENTIFIED      OTHER FINDINGS: NONE      ER negative, PR negative, HER2 negative (IHC 0) Ki-67 95%.       2. Breast, right, needle core biopsy, 12:00 8cmfn (coil clip) :      INVASIVE DUCTAL CARCINOMA      DUCTAL CARCINOMA IN SITU, SOLID, HIGH NUCLEAR GRADE WITH NECROSIS      TUBULE FORMATION: SCORE 3      NUCLEAR PLEOMORPHISM: SCORE 3      MITOTIC COUNT: SCORE 2      TOTAL SCORE: 8      OVERALL GRADE: 3      LYMPHOVASCULAR INVASION: NOT IDENTIFIED      CANCER LENGTH: 0.3 CM      CALCIFICATIONS: NOT IDENTIFIED      OTHER FINDINGS: NONE            3. Breast, right, needle core biopsy, 9:00 12cmfn (venus) :      BENIGN BREAST TISSUE WITH DENSE STROMAL FIBROSIS.      NEGATIVE FOR ATYPIA OR MALIGNANCY.     07/08/2024 Surgery   Patient went right breast lumpectomy and sentinel lymph node biopsy.  1. 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      - DUCTAL CARCINOMA IN SITU (DCIS).      - SEE CANCER SUMMARY AND NOTE BELOW.      - TWO BIOPSY SITES WITH CORRESPONDING HEART AND COIL CLIPS.      - TWO SAVI SCOUT TAGS.       2. Lymph node, sentinel, biopsy, Right :      - MICROMETASTATIC CARCINOMA INVOLVES ONE OF FIVE LYMPH NODES (1MI/5).      - SEE CANCER SUMMARY AND NOTE BELOW.       3. Breast, excision, Right medial posterior margin :      - BENIGN BREAST TISSUE.      - NEGATIVE FOR ATYPIA AND MALIGNANCY.      - SEE CANCER SUMMARY BELOW.   TUMOR Histologic Type: Invasive carcinoma of no special type (ductal) Histologic Grade (Nottingham Histologic Score) Glandular (Acinar)/Tubular Differentiation: 3 Nuclear Pleomorphism: 3 Mitotic Rate: 3 Overall Grade: 3 Tumor Size: Greatest dimension of largest invasive focus: 19 mm Ductal Carcinoma In Situ (DCIS): Present, high-grade with comedonecrosis Lymphatic and/or Vascular Invasion: Present, extensive (LVI in 2 or more blocks)  Treatment Effect in the Breast: No known presurgical therapy MARGINS Margin Status for Invasive Carcinoma: Invasive Carcinoma involves inferior (unifocal) and superior (unifocal) margins and is 0.5 mm to anterior margin Margin Status for DCIS: All margins negative for DCIS Distance from DCIS to closest margin: 0.5 mm Specify closest margin: Inferior REGIONAL LYMPH NODES Regional Lymph Node Status: Tumor present in regional lymph node(s) Number of Lymph Nodes with Macrometastases (greater than 2 mm): 0 Number of Lymph Nodes with Micrometastases (greater than 0.2 mm to 2 mm and/or greater than 200 cells): 1 Number of Lymph Nodes with Isolated Tumor Cells (0.2 mm or less OR 200 cells or less): 0 Size of Largest Metastatic Deposit: 1.9  mm Extranodal  Extension: Not identified Total Number of Lymph Nodes Examined (sentinel and non-sentinel): 5 Number of Sentinel Nodes Examined: 5 DISTANT METASTASIS Distant Site(s) Involved, if applicable: Not applicable PATHOLOGIC STAGE CLASSIFICATION (pTNM, AJCC 8th Edition): Modified Classification: Not applicable pT Category: pT1c T Suffix: (m) multiple primary synchronous tumors in a single organ pN Category: pN38mi N Suffix: (sn) pM Category: Not applicable SPECIAL STUDIES Breast Biomarker Testing Performed on Previous Biopsy: [id number] (heart) Estrogen Receptor: Negative (0%) Progesterone Receptor: Negative (0%) HER2 IHC: Negative (0) Proliferation Marker Ki67: 95%   07/22/2024 Cancer Staging   Staging form: Breast, AJCC 8th Edition - Pathologic stage from 07/22/2024: Stage IB (pT1c, pN6mi(sn), cM0, G3, ER-, PR-, HER2-) - Signed by Babara Call, MD on 07/22/2024 Stage prefix: Initial diagnosis Method of lymph node assessment: Sentinel lymph node biopsy Multigene prognostic tests performed: None Histologic grading system: 3 grade system   07/25/2024 Surgery   Patient reports additional excision for positive margin.  1. Breast, excision, superior margin of right breast - RESIDUAL INVASIVE DUCTAL CARCINOMA, COMPLETELY EXCISED. - CARCINOMA PRESENT 2 MM FROM THE POSTERIOR MARGIN AND 4 MM FROM THE ANTERIOR AND SUPERIOR MARGINS. 2. Breast, excision, inferior margin of right breast - RESIDUAL INVASIVE DUCTAL CARCINOMA, COMPLETELY EXCISED. - CARCINOMA PRESENT 1 MM FROM THE NEW INFERIOR MARGIN.   08/23/2024 Genetic Testing   Negative genetic testing. No pathogenic variants identified on the [facility] CancerNext-Expanded+RNA Panel. The report date is 08/23/2024.  The CancerNext-Expanded gene panel offered by [facility] and includes sequencing, rearrangement, and RNA analysis for the following 77 genes: AIP, ALK, APC, ATM, AXIN2, BAP1, BARD1, BMPR1A, BRCA1, BRCA2, BRIP1, CDC73, CDH1, CDK4, CDKN1B,  CDKN2A, CEBPA, CHEK2, CTNNA1, DDX41, DICER1, ETV6, FH, FLCN, GATA2, LZTR1, MAX, MBD4, MEN1, MET, MLH1, MSH2, MSH3, MSH6, MUTYH, NF1, NF2, NTHL1, PALB2, PHOX2B, PMS2, POT1, PRKAR1A, PTCH1, PTEN, RAD51C, RAD51D, RB1, RET, RPS20, RUNX1, SDHA, SDHAF2, SDHB, SDHC, SDHD, SMAD4, SMARCA4, SMARCB1, SMARCE1, STK11, SUFU, TMEM127, TP53, TSC1, TSC2, VHL, and WT1 (sequencing and deletion/duplication); EGFR, HOXB13, KIT, MITF, PDGFRA, POLD1, and POLE (sequencing only); EPCAM and GREM1 (deletion/duplication only).    10/23/2024 -  Chemotherapy   Patient is on Treatment Plan : BREAST DOSE DENSE AC q14d / PACLitaxel q7d       ALLERGIES:  is allergic to prednisone and silicone.  MEDICATIONS:  Current Outpatient Medications  Medication Sig Dispense Refill   acetaminophen  (TYLENOL ) 500 MG tablet Take 500 mg by mouth every 6 (six) hours as needed.     albuterol  (VENTOLIN  HFA) 108 (90 Base) MCG/ACT inhaler Inhale 2 puffs into the lungs every 4 (four) hours as needed for wheezing or shortness of breath. 8.5 g 2   ascorbic acid (VITAMIN C) 500 MG tablet Take 500 mg by mouth daily.     azelastine  (ASTELIN ) 0.1 % nasal spray Place 1 spray into both nostrils 2 (two) times daily. Use in each nostril as directed 30 mL 0   benzonatate  (TESSALON ) 200 MG capsule Take 1 capsule (200 mg total) by mouth 3 (three) times daily as needed for cough. 30 capsule 1   BIOTIN PO Take by mouth daily.     budesonide  (PULMICORT ) 0.25 MG/2ML nebulizer solution Take 2 mLs (0.25 mg total) by nebulization 2 (two) times daily. 60 mL 12   busPIRone  (BUSPAR ) 5 MG tablet Take 5 mg by mouth 2 (two) times daily.     cholecalciferol (VITAMIN D3) 25 MCG (1000 UNIT) tablet Take 1,000 Units by mouth daily.     DULoxetine  (CYMBALTA ) 30  MG capsule Take 30 mg by mouth at bedtime.     enoxaparin  (LOVENOX ) 80 MG/0.8ML injection Inject 0.8 mLs (80 mg total) into the skin every 12 (twelve) hours. 48 mL 2   fentaNYL  (DURAGESIC ) 25 MCG/HR Place 1 patch onto the  skin every 3 (three) days. 10 patch 0   gabapentin  (NEURONTIN ) 300 MG capsule Take 600 mg by mouth 2 (two) times daily.     ibuprofen  (ADVIL ) 200 MG tablet Take 200 mg by mouth every 6 (six) hours as needed.     ipratropium-albuterol  (DUONEB) 0.5-2.5 (3) MG/3ML SOLN Take 3 mLs by nebulization 4 (four) times daily. 360 mL 3   lidocaine -prilocaine  (EMLA ) cream Apply 1 Application topically as needed. 30 g 3   linaclotide (LINZESS) 145 MCG CAPS capsule Take 145 mcg by mouth daily before breakfast.     LORazepam  (ATIVAN ) 0.5 MG tablet Take 1 tablet (0.5 mg total) by mouth every 12 (twelve) hours as needed for anxiety (nausea). 30 tablet 0   montelukast  (SINGULAIR ) 10 MG tablet Take 1 tablet (10 mg total) by mouth See admin instructions. Take 1 tablet on the day prior to chemotherapy and take 1 tablet daily for 2 days after chemotherapy. 30 tablet 0   Multiple Vitamin (MULTIVITAMIN WITH MINERALS) TABS tablet Take 1 tablet by mouth daily.     OLANZapine  (ZYPREXA ) 5 MG tablet Take 1 tablet (5 mg total) by mouth at bedtime. 30 tablet 3   potassium chloride  SA (KLOR-CON  M) 20 MEQ tablet Take 1 tablet (20 mEq total) by mouth 2 (two) times daily. 30 tablet 0   prochlorperazine  (COMPAZINE ) 10 MG tablet Take 1 tablet (10 mg total) by mouth every 6 (six) hours as needed for nausea or vomiting. 30 tablet 1   promethazine  (PHENERGAN ) 25 MG tablet Take 1 tablet (25 mg total) by mouth every 8 (eight) hours as needed for nausea or vomiting. 60 tablet 0   dexamethasone  (DECADRON ) 4 MG tablet Take 2 tablets (8 mg total) by mouth daily for 2 days. Start the day after doxorubicin/cyclophosphamide chemotherapy. Take with food. (Patient not taking: Reported on 10/16/2024) 30 tablet 1   diphenoxylate -atropine  (LOMOTIL ) 2.5-0.025 MG tablet Take 1 tablet by mouth 4 (four) times daily. (Patient not taking: Reported on 10/16/2024) 60 tablet 0   esomeprazole  (NEXIUM ) 40 MG capsule Take 1 capsule (40 mg total) by mouth 2 (two) times  daily before a meal. (Patient not taking: Reported on 10/16/2024) 60 capsule 2   nystatin  (MYCOSTATIN ) 100000 UNIT/ML suspension Take 5 mLs (500,000 Units total) by mouth 4 (four) times daily. Swish and swallow (Patient not taking: Reported on 10/16/2024) 473 mL 1   oxyCODONE  (OXY IR/ROXICODONE ) 5 MG immediate release tablet Take 1 tablet (5 mg total) by mouth every 6 (six) hours as needed for severe pain (pain score 7-10) or breakthrough pain. (Patient not taking: Reported on 10/16/2024) 30 tablet 0   Turmeric (QC TUMERIC COMPLEX PO) Take by mouth daily. (Patient not taking: Reported on 10/16/2024)     No current facility-administered medications for this visit.    VITAL SIGNS: BP 116/78   Pulse 98   Temp (!) 96.3 F (35.7 C) (Tympanic)   Resp 16   Ht 4' 11 (1.499 m)   Wt 187 lb (84.8 kg)   BMI 37.77 kg/m  Filed Weights   10/16/24 0845  Weight: 187 lb (84.8 kg)    Estimated body mass index is 37.77 kg/m as calculated from the following:   Height as of  this encounter: 4' 11 (1.499 m).   Weight as of this encounter: 187 lb (84.8 kg).  LABS: CBC:    Component Value Date/Time   WBC 5.0 09/23/2024 1404   WBC 2.3 (L) 09/13/2024 0500   HGB 11.0 (L) 09/23/2024 1404   HGB 14.1 03/06/2015 1307   HCT 33.8 (L) 09/23/2024 1404   HCT 42.4 03/06/2015 1307   PLT 322 09/23/2024 1404   PLT 252 03/06/2015 1307   MCV 100.0 09/23/2024 1404   MCV 90 03/06/2015 1307   NEUTROABS 3.5 09/23/2024 1404   NEUTROABS 4.8 03/06/2015 1307   LYMPHSABS 0.5 (L) 09/23/2024 1404   LYMPHSABS 0.5 (L) 03/06/2015 1307   MONOABS 0.8 09/23/2024 1404   MONOABS 0.4 03/06/2015 1307   EOSABS 0.1 09/23/2024 1404   EOSABS 0.0 03/06/2015 1307   BASOSABS 0.0 09/23/2024 1404   BASOSABS 0.1 03/06/2015 1307   Comprehensive Metabolic Panel:    Component Value Date/Time   NA 137 09/30/2024 0835   NA 141 03/06/2015 1307   K 3.7 09/30/2024 0835   K 3.2 (L) 03/06/2015 1307   CL 106 09/30/2024 0835   CL 107 03/06/2015  1307   CO2 21 (L) 09/30/2024 0835   CO2 25 03/06/2015 1307   BUN 8 09/30/2024 0835   BUN 5 (L) 03/06/2015 1307   CREATININE 0.82 09/30/2024 0835   CREATININE 0.89 03/06/2015 1307   GLUCOSE 140 (H) 09/30/2024 0835   GLUCOSE 83 03/06/2015 1307   CALCIUM 8.8 (L) 09/30/2024 0835   CALCIUM 8.8 (L) 03/06/2015 1307   AST 85 (H) 09/30/2024 0835   ALT 85 (H) 09/30/2024 0835   ALT 14 03/06/2015 1307   ALKPHOS 82 09/30/2024 0835   ALKPHOS 46 03/06/2015 1307   BILITOT 1.0 09/30/2024 0835   PROT 6.2 (L) 09/30/2024 0835   PROT 7.4 03/06/2015 1307   ALBUMIN  3.1 (L) 09/30/2024 0835   ALBUMIN  4.2 03/06/2015 1307    RADIOGRAPHIC STUDIES: PERIPHERAL VASCULAR CATHETERIZATION Result Date: 10/14/2024 See surgical note for result.  ECHOCARDIOGRAM COMPLETE Result Date: 10/09/2024    ECHOCARDIOGRAM REPORT   Patient Name:   MAKHIYA COBURN Woon Date of Exam: 10/09/2024 Medical Rec #:  [id number]     Height:       59.0 in Accession #:    [id number]    Weight:       186.0 lb Date of Birth:  [birth year]      BSA:          1.788 m Patient Age:    48 years      BP:           114/71 mmHg Patient Gender: F             HR:           104 bpm. Exam Location:  [facility] Procedure: 2D Echo, Cardiac Doppler, Color Doppler, 3D Echo and Strain Analysis            (Both Spectral and Color Flow Doppler were utilized during            procedure). Indications:     Chemo Z09  History:         Patient has no prior history of Echocardiogram examinations.                  Signs/Symptoms:Dyspnea and Murmur. Angina.  Sonographer:     Christopher Furnace Referring Phys:  [id number] ZHOU YU Diagnosing Phys: Evalene Lunger MD  Sonographer Comments: Global longitudinal strain was  attempted. IMPRESSIONS  1. Left ventricular ejection fraction, by estimation, is 60 to 65%. The left ventricle has normal function. The left ventricle has no regional wall motion abnormalities. Left ventricular diastolic parameters are consistent with Grade I diastolic dysfunction (impaired  relaxation).  2. Right ventricular systolic function is normal. The right ventricular size is normal. There is normal pulmonary artery systolic pressure. The estimated right ventricular systolic pressure is 15.8 mmHg.  3. The mitral valve is normal in structure. No evidence of mitral valve regurgitation. No evidence of mitral stenosis.  4. The aortic valve is normal in structure. Aortic valve regurgitation is not visualized. No aortic stenosis is present.  5. The inferior vena cava is normal in size with greater than 50% respiratory variability, suggesting right atrial pressure of 3 mmHg. FINDINGS  Left Ventricle: Left ventricular ejection fraction, by estimation, is 60 to 65%. The left ventricle has normal function. The left ventricle has no regional wall motion abnormalities. The global longitudinal strain is normal despite suboptimal segment tracking. The left ventricular internal cavity size was normal in size. There is no left ventricular hypertrophy. Left ventricular diastolic parameters are consistent with Grade I diastolic dysfunction (impaired relaxation). Right Ventricle: The right ventricular size is normal. No increase in right ventricular wall thickness. Right ventricular systolic function is normal. There is normal pulmonary artery systolic pressure. The tricuspid regurgitant velocity is 1.64 m/s, and  with an assumed right atrial pressure of 5 mmHg, the estimated right ventricular systolic pressure is 15.8 mmHg. Left Atrium: Left atrial size was normal in size. Right Atrium: Right atrial size was normal in size. Pericardium: There is no evidence of pericardial effusion. Mitral Valve: The mitral valve is normal in structure. No evidence of mitral valve regurgitation. No evidence of mitral valve stenosis. MV peak gradient, 3.1 mmHg. The mean mitral valve gradient is 2.0 mmHg. Tricuspid Valve: The tricuspid valve is normal in structure. Tricuspid valve regurgitation is mild . No evidence of tricuspid  stenosis. Aortic Valve: The aortic valve is normal in structure. Aortic valve regurgitation is not visualized. No aortic stenosis is present. Aortic valve mean gradient measures 7.0 mmHg. Aortic valve peak gradient measures 12.1 mmHg. Aortic valve area, by VTI measures 2.33 cm. Pulmonic Valve: The pulmonic valve was normal in structure. Pulmonic valve regurgitation is not visualized. No evidence of pulmonic stenosis. Aorta: The aortic root is normal in size and structure. Venous: The inferior vena cava is normal in size with greater than 50% respiratory variability, suggesting right atrial pressure of 3 mmHg. IAS/Shunts: No atrial level shunt detected by color flow Doppler. Additional Comments: 3D was performed not requiring image post processing on an independent workstation and was indeterminate.  LEFT VENTRICLE PLAX 2D LVIDd:         4.00 cm   Diastology LVIDs:         2.90 cm   LV e' medial:    6.09 cm/s LV PW:         1.00 cm   LV E/e' medial:  12.0 LV IVS:        0.90 cm   LV e' lateral:   10.40 cm/s LVOT diam:     2.00 cm   LV E/e' lateral: 7.0 LV SV:         55 LV SV Index:   31 LVOT Area:     3.14 cm LV IVRT:       92 msec  RIGHT VENTRICLE RV Basal diam:  3.70 cm  PULMONARY VEINS RV Mid diam:    2.80 cm     Diastolic Velocity: 20.30 cm/s RV S prime:     14.40 cm/s  S/D Velocity:       1.90 TAPSE (M-mode): 1.6 cm      Systolic Velocity:  39.10 cm/s LEFT ATRIUM           Index        RIGHT ATRIUM           Index LA diam:      2.60 cm 1.45 cm/m   RA Area:     13.60 cm LA Vol (A2C): 30.6 ml 17.11 ml/m  RA Volume:   36.60 ml  20.47 ml/m LA Vol (A4C): 17.2 ml 9.62 ml/m  AORTIC VALVE AV Area (Vmax):    1.97 cm AV Area (Vmean):   1.99 cm AV Area (VTI):     2.33 cm AV Vmax:           174.00 cm/s AV Vmean:          124.500 cm/s AV VTI:            0.236 m AV Peak Grad:      12.1 mmHg AV Mean Grad:      7.0 mmHg LVOT Vmax:         109.00 cm/s LVOT Vmean:        78.800 cm/s LVOT VTI:          0.175 m  LVOT/AV VTI ratio: 0.74  AORTA Ao Root diam: 2.60 cm MITRAL VALVE               TRICUSPID VALVE MV Area (PHT): 4.01 cm    TR Peak grad:   10.8 mmHg MV Area VTI:   3.46 cm    TR Vmax:        164.00 cm/s MV Peak grad:  3.1 mmHg MV Mean grad:  2.0 mmHg    SHUNTS MV Vmax:       0.88 m/s    Systemic VTI:  0.18 m MV Vmean:      59.4 cm/s   Systemic Diam: 2.00 cm MV Decel Time: 189 msec MV E velocity: 72.80 cm/s MV A velocity: 85.40 cm/s MV E/A ratio:  0.85 Evalene Lunger MD Electronically signed by Evalene Lunger MD Signature Date/Time: 10/09/2024/2:08:14 PM    Final    VAS US  UPPER EXTREMITY VENOUS DUPLEX Result Date: 10/07/2024 UPPER VENOUS STUDY  Patient Name:  JENNALYNN RIVARD Bruins  Date of Exam:   10/03/2024 Medical Rec #: [id number]      Accession #:    [id number] Date of Birth: 01-12-1976       Patient Gender: F Patient Age:   73 years Exam Location:  [facility] Procedure:      VAS US  UPPER EXTREMITY VENOUS DUPLEX Referring Phys: ORVIN DARING --------------------------------------------------------------------------------  Indications: F/U DVT Rt UE Risk Factors: DVT 07/23/2024: DVT Rt IJV and SCV. Comparison Study: 07/23/2024 Performing Technologist: Leafy Gibes RVS  Examination Guidelines: A complete evaluation includes B-mode imaging, spectral Doppler, color Doppler, and power Doppler as needed of all accessible portions of each vessel. Bilateral testing is considered an integral part of a complete examination. Limited examinations for reoccurring indications may be performed as noted.  Right Findings: +----------+------------+---------+-----------+----------+-------+ RIGHT     CompressiblePhasicitySpontaneousPropertiesSummary +----------+------------+---------+-----------+----------+-------+ IJV           Full       Yes       Yes                      +----------+------------+---------+-----------+----------+-------+  Subclavian    Full       Yes       Yes                       +----------+------------+---------+-----------+----------+-------+ Axillary      Full       Yes       Yes                      +----------+------------+---------+-----------+----------+-------+ Brachial      Full       Yes       Yes                      +----------+------------+---------+-----------+----------+-------+ Radial        Full       Yes       Yes                      +----------+------------+---------+-----------+----------+-------+ Ulnar         Full       Yes       Yes                      +----------+------------+---------+-----------+----------+-------+ Cephalic      Full       Yes       Yes                      +----------+------------+---------+-----------+----------+-------+ Basilic       Full       Yes       Yes                      +----------+------------+---------+-----------+----------+-------+  Left Findings: +----+------------+---------+-----------+----------+-------+ LEFTCompressiblePhasicitySpontaneousPropertiesSummary +----+------------+---------+-----------+----------+-------+ IJV     Full       Yes       Yes                      +----+------------+---------+-----------+----------+-------+  Summary:  Right: No evidence of deep vein thrombosis in the upper extremity. No evidence of superficial vein thrombosis in the upper extremity. No evidence of thrombosis in the subclavian. Findings for deep vein thrombosis appear to be resolved from previous study.  *See table(s) above for measurements and observations.  Diagnosing physician: Cordella Shawl MD Electronically signed by Cordella Shawl MD on 10/07/2024 at 10:05:29 AM.    Final     PERFORMANCE STATUS (ECOG) : 1 - Symptomatic but completely ambulatory  Review of Systems Unless otherwise noted, a complete review of systems is negative.  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  No discharge.  Pain does not extend into the neck.  No swelling to the arm.  Suspect the discomfort is just pain associated with port placement and should resolve quickly.  Discussed triggers for ED or clinic follow-up including worsening redness/pain/edema, warmth or fevers.  Emla  cream prescribed  Neoplasm related pain -patient reports having lost her oxycodone  while packing to move houses.  She says that she has been out  for at least a week.  Says that her fentanyl  patch washed off while bathing this morning.  Overall, she says her rectal pain has improved.  She does not feel that the fentanyl  is helping so we will discontinue. Had a long conversation with patient regarding the necessity of safe storage of her opioids. Will sign pain contract today. Will also check UDS. Also discussed plan for future weaning from opioids.  I will provide patient with limited quantity of oxycodone  today (#15 tablets).  Patient can also use acetaminophen  as needed.  MD follow-up next week.  Case and plan discussed with Dr. Babara  Patient expressed understanding and was in agreement with this plan. She also understands that She can call clinic at any time with any questions, concerns, or complaints.   Thank you for allowing me to participate in the care of this very pleasant patient.   Time Total: 20 minutes  Visit consisted of counseling and education dealing with the complex and emotionally intense issues of symptom management in the setting of serious illness.Greater than 50%  of this time was spent counseling and coordinating care related to the above assessment  and plan.  Signed by: Fonda Mower, PhD, NP-C

## 2024-10-17 ENCOUNTER — Encounter: Payer: Self-pay | Admitting: Oncology

## 2024-10-21 ENCOUNTER — Encounter: Payer: Self-pay | Admitting: Oncology

## 2024-10-21 ENCOUNTER — Inpatient Hospital Stay

## 2024-10-21 DIAGNOSIS — K1231 Oral mucositis (ulcerative) due to antineoplastic therapy: Secondary | ICD-10-CM | POA: Insufficient documentation

## 2024-10-21 DIAGNOSIS — G893 Neoplasm related pain (acute) (chronic): Secondary | ICD-10-CM | POA: Insufficient documentation

## 2024-10-22 ENCOUNTER — Encounter: Payer: Self-pay | Admitting: Vascular Surgery

## 2024-10-22 ENCOUNTER — Encounter: Payer: Self-pay | Admitting: Oncology

## 2024-10-22 LAB — MISC LABCORP TEST (SEND OUT): Labcorp test code: 791194

## 2024-10-22 MED FILL — Fosaprepitant Dimeglumine For IV Infusion 150 MG (Base Eq): INTRAVENOUS | Qty: 5 | Status: AC

## 2024-10-23 ENCOUNTER — Other Ambulatory Visit: Payer: Self-pay

## 2024-10-23 ENCOUNTER — Inpatient Hospital Stay

## 2024-10-23 ENCOUNTER — Encounter: Payer: Self-pay | Admitting: *Deleted

## 2024-10-23 ENCOUNTER — Inpatient Hospital Stay: Admitting: Oncology

## 2024-10-23 ENCOUNTER — Encounter: Payer: Self-pay | Admitting: Oncology

## 2024-10-23 VITALS — BP 106/72 | HR 97 | Temp 96.0°F | Resp 18 | Wt 183.7 lb

## 2024-10-23 VITALS — BP 100/62 | HR 73 | Temp 96.3°F | Resp 19

## 2024-10-23 DIAGNOSIS — C210 Malignant neoplasm of anus, unspecified: Secondary | ICD-10-CM

## 2024-10-23 DIAGNOSIS — Z95828 Presence of other vascular implants and grafts: Secondary | ICD-10-CM | POA: Diagnosis not present

## 2024-10-23 DIAGNOSIS — I2699 Other pulmonary embolism without acute cor pulmonale: Secondary | ICD-10-CM | POA: Diagnosis not present

## 2024-10-23 DIAGNOSIS — R7309 Other abnormal glucose: Secondary | ICD-10-CM

## 2024-10-23 DIAGNOSIS — C50919 Malignant neoplasm of unspecified site of unspecified female breast: Secondary | ICD-10-CM

## 2024-10-23 DIAGNOSIS — K521 Toxic gastroenteritis and colitis: Secondary | ICD-10-CM | POA: Diagnosis not present

## 2024-10-23 DIAGNOSIS — T451X5A Adverse effect of antineoplastic and immunosuppressive drugs, initial encounter: Secondary | ICD-10-CM

## 2024-10-23 DIAGNOSIS — E876 Hypokalemia: Secondary | ICD-10-CM

## 2024-10-23 DIAGNOSIS — R110 Nausea: Secondary | ICD-10-CM

## 2024-10-23 LAB — CMP (CANCER CENTER ONLY)
ALT: 21 U/L (ref 0–44)
AST: 30 U/L (ref 15–41)
Albumin: 3.7 g/dL (ref 3.5–5.0)
Alkaline Phosphatase: 90 U/L (ref 38–126)
Anion gap: 12 (ref 5–15)
BUN: 6 mg/dL (ref 6–20)
CO2: 23 mmol/L (ref 22–32)
Calcium: 9.5 mg/dL (ref 8.9–10.3)
Chloride: 104 mmol/L (ref 98–111)
Creatinine: 0.89 mg/dL (ref 0.44–1.00)
GFR, Estimated: >60 mL/min (ref >60)
Glucose, Bld: 125 mg/dL — ABNORMAL HIGH (ref 70–99)
Potassium: 4.1 mmol/L (ref 3.5–5.1)
Sodium: 139 mmol/L (ref 135–145)
Total Bilirubin: 0.6 mg/dL (ref 0.0–1.2)
Total Protein: 6.5 g/dL (ref 6.5–8.1)

## 2024-10-23 LAB — CBC WITH DIFFERENTIAL (CANCER CENTER ONLY)
Abs Immature Granulocytes: 0.05 K/uL (ref 0.00–0.07)
Basophils Absolute: 0 K/uL (ref 0.0–0.1)
Basophils Relative: 1 %
Eosinophils Absolute: 0.1 K/uL (ref 0.0–0.5)
Eosinophils Relative: 2 %
HCT: 38 % (ref 36.0–46.0)
Hemoglobin: 12.2 g/dL (ref 12.0–15.0)
Immature Granulocytes: 1 %
Lymphocytes Relative: 21 %
Lymphs Abs: 1.1 K/uL (ref 0.7–4.0)
MCH: 32.4 pg (ref 26.0–34.0)
MCHC: 32.1 g/dL (ref 30.0–36.0)
MCV: 100.8 fL — ABNORMAL HIGH (ref 80.0–100.0)
Monocytes Absolute: 0.7 K/uL (ref 0.1–1.0)
Monocytes Relative: 13 %
Neutro Abs: 3.3 K/uL (ref 1.7–7.7)
Neutrophils Relative %: 62 %
Platelet Count: 346 K/uL (ref 150–400)
RBC: 3.77 MIL/uL — ABNORMAL LOW (ref 3.87–5.11)
RDW: 15.1 % (ref 11.5–15.5)
WBC Count: 5.2 K/uL (ref 4.0–10.5)
nRBC: 0 % (ref 0.0–0.2)

## 2024-10-23 LAB — HEMOGLOBIN A1C
Hgb A1c MFr Bld: 4.3 % — ABNORMAL LOW (ref 4.8–5.6)
Mean Plasma Glucose: 76.71 mg/dL

## 2024-10-23 LAB — MAGNESIUM: Magnesium: 2.3 mg/dL (ref 1.7–2.4)

## 2024-10-23 MED ORDER — SODIUM CHLORIDE 0.9 % IV SOLN
600.0000 mg/m2 | Freq: Once | INTRAVENOUS | Status: AC
  Administered 2024-10-23: 1160 mg via INTRAVENOUS
  Filled 2024-10-23: qty 58

## 2024-10-23 MED ORDER — POTASSIUM CHLORIDE CRYS ER 20 MEQ PO TBCR: 20.0000 meq | EXTENDED_RELEASE_TABLET | Freq: Two times a day (BID) | ORAL | 0 refills | Status: AC

## 2024-10-23 MED ORDER — DOXORUBICIN HCL CHEMO IV INJECTION 2 MG/ML
60.0000 mg/m2 | Freq: Once | INTRAVENOUS | Status: AC
  Administered 2024-10-23: 116 mg via INTRAVENOUS
  Filled 2024-10-23: qty 58

## 2024-10-23 MED ORDER — POTASSIUM CHLORIDE 20 MEQ/100ML IV SOLN: 20.0000 meq | Freq: Once | INTRAVENOUS | Status: DC

## 2024-10-23 MED ORDER — SODIUM CHLORIDE 0.9 % IV SOLN
150.0000 mg | Freq: Once | INTRAVENOUS | Status: AC
  Administered 2024-10-23: 150 mg via INTRAVENOUS
  Filled 2024-10-23: qty 150

## 2024-10-23 MED ORDER — DEXAMETHASONE SOD PHOSPHATE PF 10 MG/ML IJ SOLN
10.0000 mg | Freq: Once | INTRAMUSCULAR | Status: AC
  Administered 2024-10-23: 10 mg via INTRAVENOUS

## 2024-10-23 MED ORDER — PALONOSETRON HCL INJECTION 0.25 MG/5ML
0.2500 mg | Freq: Once | INTRAVENOUS | Status: AC
  Administered 2024-10-23: 0.25 mg via INTRAVENOUS
  Filled 2024-10-23: qty 5

## 2024-10-23 MED ORDER — SODIUM CHLORIDE 0.9 % IV SOLN
INTRAVENOUS | Status: DC
  Filled 2024-10-23: qty 250

## 2024-10-23 MED ORDER — POTASSIUM CHLORIDE CRYS ER 20 MEQ PO TBCR: 20.0000 meq | EXTENDED_RELEASE_TABLET | Freq: Three times a day (TID) | ORAL | 0 refills | Status: DC

## 2024-10-23 NOTE — Assessment & Plan Note (Signed)
 Pain around medi port is getting better.  monitor

## 2024-10-23 NOTE — Assessment & Plan Note (Signed)
 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 mT1c pN30mi Initial margin was positive and patient underwent reexcision and achieved a negative margin.  Recommend  adjuvant ddAC followed by weekly Taxol. Baseline Echo LVEF 60-65%.Rationale and side effects were reviewed with patient.  Labs are reviewed and discussed with patient. Proceed with cycle 1 ddAC with D3 GCSF. Recommend Dexamethasone  8mg  daily after chemo.  Claritin 10mg  daily x 4 days after GCSF

## 2024-10-23 NOTE — Assessment & Plan Note (Addendum)
 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  Per IR, right inguinal lymph node is not enlarged.  Previously on concurrent chemotherapy 5-FU and mitomycin  C with Radiation. S/p D1 5-FU and Mitomycin -C. D29 5-FU infusion was switched to concurrent Xeloda  due to lack of medi port.- she has picc line. Received D29 Mitomycin  C. Chemo was interrupted due to multi focal pneumonia.  S/p concurrent Xeloda  with Radiation [last RT 09/24/2024 .   Future plan - DRE 8-12 weeks after treatment - Jan 2026. I will re image after she finishes adjuvant breast chemotherapy

## 2024-10-23 NOTE — Progress Notes (Signed)
 Error

## 2024-10-23 NOTE — Assessment & Plan Note (Signed)
 Continue home antiemetics phernegan and compazine  PRN.  Added lorazepam  0.5 every 12 hours as needed anxiety and nausea.

## 2024-10-23 NOTE — Assessment & Plan Note (Signed)
 Improved. Continue potassium 20 mEq twice daily.

## 2024-10-23 NOTE — Progress Notes (Signed)
 Nutrition Assessment   Reason for Assessment:  Receiving chemo, poor appetite, weight loss   ASSESSMENT:  48 year old female with 2 primary cancers (anal and breast).  Past medical history of vulvectomy, GERD, lupus, PTSD, PE, DVT.  Patient has completed treatment for anal cancer (chemo/radiation).  Currently receiving AC for breast cancer.    Met with patient during infusion.  Reports poor appetite since starting treatment for anal cancer.  Reports some nausea and diarrhea.     Medications: ativan , compazine , phenergan , KCL, lomotil    Labs: glucose 125   Anthropometrics:   Height: 4'11 Weight: 183 lb 195 lb 09/17/24 191-200 lb mostly noted in chart July-Sept 2025 BMI: 37  6% weight loss in the last month 4% weight loss in the last 3 months   Estimated Energy Needs  Kcals: 8173-7924 Protein: 91-103 g Fluid: >   NUTRITION DIAGNOSIS: Inadequate oral intake related to cancer and related treatment side effects as evidenced by 4% weight loss in the last 3 months, poor po intake   INTERVENTION:  Recommend setting alarm to remind to eat q 2-3 hours Discussed foods to choose with nausea and diarrhea.  Handout provided Recommend boost plus/ensure plus BID for added calories and protein.  Will contact [facility] regarding assistance with oral nutrition supplements Contact information provided   MONITORING, EVALUATION, GOAL: weight trends, intake   Next Visit: Wed, Dec 31 after injection  Monica Gonzalez B. Dasie SOLON, CSO, LDN Registered Dietitian [phone]

## 2024-10-23 NOTE — Progress Notes (Signed)
 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  monitor  Anal squamous cell carcinoma (HCC) Left inguinal nodal involvement, which upgrade staging to stage III, cT3 N1a.  Per IR, right inguinal lymph node is not enlarged.  Previously on concurrent chemotherapy 5-FU and mitomycin  C with Radiation. S/p D1 5-FU and Mitomycin -C. D29 5-FU infusion was switched to concurrent Xeloda  due to lack of medi port.- she has picc line. Received D29 Mitomycin  C. Chemo was interrupted due to multi focal pneumonia.  S/p concurrent Xeloda  with Radiation [last RT 09/24/2024 .   Future plan - DRE 8-12 weeks after treatment - Jan 2026. I will re image after she finishes adjuvant breast chemotherapy  Acute subsegmental pulmonary embolism (HCC) Continue Lovenox  1 mg/kg twice daily.  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 mT1c pN14mi Initial margin was positive and patient underwent reexcision and achieved a negative margin.  Recommend  adjuvant ddAC followed by weekly Taxol. Baseline Echo LVEF 60-65%.Rationale and side effects were reviewed with patient.  Labs are reviewed and discussed with patient. Proceed with cycle 1 ddAC with D3 GCSF. Recommend Dexamethasone  8mg  daily after chemo.  Claritin 10mg  daily x 4 days after GCSF  Chemotherapy induced diarrhea Secondary to chemotherapy and radiation.  Recommend Lomotil  1 tablet p.o. 4 times daily PRN   Chemotherapy-induced nausea Continue home antiemetics phernegan and compazine  PRN.  Added lorazepam  0.5 every 12 hours as needed anxiety and nausea.     Hypokalemia Improved. Continue potassium 20 mEq  twice daily.       Orders Placed This Encounter  Procedures   Magnesium     Standing Status:   Future    Expected Date:   10/23/2024    Expiration Date:   01/21/2025   CBC with Differential (Cancer Center Only)    Standing Status:   Future    Expected Date:   11/11/2024    Expiration Date:   11/11/2025   CMP (Cancer Center only)    Standing Status:   Future    Expected Date:   11/11/2024    Expiration Date:   11/11/2025   CBC with Differential (Cancer Center Only)    Standing Status:   Future    Expected Date:   10/30/2024    Expiration Date:   01/28/2025   CMP (Cancer Center only)    Standing Status:   Future    Expected Date:   10/30/2024    Expiration Date:   01/28/2025   Magnesium     Standing Status:   Future    Number of Occurrences:   1    Expected Date:   10/30/2024    Expiration Date:   01/28/2025   Follow-up per LOS  All questions were answered. The patient knows to call the clinic with any problems, questions or concerns.  [name], MD, PhD [facility] Hematology Oncology 10/23/2024    HISTORY OF PRESENTING ILLNESS:  Monica Gonzalez 48 y.o. 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 Summary of oncologic history is as follows: Oncology History  Anal  squamous cell carcinoma (HCC)  05/29/2024 Initial Diagnosis   Anal squamous cell carcinoma   Patient has noticed a hard mass at the anal area which she initially thought was a hemorrhoid.  The mass has been present for more than a month and appears to be enlarging. Patient was referred to establish care with surgeon Dr. Tye  05/16/2024, incisional biopsy of anal mass showed 1. Anus, biopsy, mass :       - INVASIVE MODERATELY DIFFERENTIATED SQUAMOUS CELL CARCINOMA  06/19/2024, left inguinal adenopathy biopsy showed Metastatic moderately  differentiated squamous cell carcinoma.  '    05/29/2024 Cancer Staging   Staging form: Anus, AJCC V9 - Clinical stage from 05/29/2024: Stage IIIA (cT3, cN1a, cM0) - Signed by Babara Call, MD on 06/26/2024 Stage prefix: Initial diagnosis   06/05/2024 Imaging   CT chest abdomen pelvis with contrast  Slight thickening in the area of the anal canal. Please correlate for history of neoplasm. There is a pathologic enlarged left inguinal node. There is a borderline right-sided node.   No additional areas of abnormal nodal enlargement or other aggressive appearing mass lesion at this time.   Fatty liver infiltration with known segment 7 hepatic hemangioma.   Of note there is a right-sided upper breast mass which was not clearly seen on prior CT scan. Please correlate for any prior study or if needed diagnostic mammographic evaluation and possible ultrasound when appropriate.     06/12/2024 Imaging   CT chest abdomen pelvis with contrast showed Slight thickening in the area of the anal canal. Please correlate for history of neoplasm. There is a pathologic enlarged left inguinal node. There is a borderline right-sided node.   No additional areas of abnormal nodal enlargement or other aggressive appearing mass lesion at this time.   Fatty liver infiltration with known segment 7 hepatic hemangioma.   Of note there is a right-sided upper breast mass which was not clearly seen on prior CT scan. Please correlate for any prior study or if needed diagnostic mammographic evaluation and possible ultrasound when appropriate.   06/19/2024 Procedure   1. Lymph node, biopsy, left inguinal adenopathy :       -  METASTATIC MODERATELY DIFFERENTIATED SQUAMOUS CELL CARCINOMA    07/01/2024 Imaging   PET scan showed 1. Hypermetabolic anal mass with hypermetabolic left inguinal adenopathy, compatible with anorectal carcinoma. 2. Hypermetabolic right breast nodule with recent biopsies on 06/18/2024. Please  correlate with pathology report.   07/31/2024 - 07/31/2024 Chemotherapy   Patient is on Treatment Plan : ANUS Mitomycin  D1,28 + 5FU D1-4, 28-31 q32d     08/23/2024 Genetic Testing   Negative genetic testing. No pathogenic variants identified on the [facility] CancerNext-Expanded+RNA Panel. The report date is 08/23/2024.  The CancerNext-Expanded gene panel offered by [facility] and includes sequencing, rearrangement, and RNA analysis for the following 77 genes: AIP, ALK, APC, ATM, AXIN2, BAP1, BARD1, BMPR1A, BRCA1, BRCA2, BRIP1, CDC73, CDH1, CDK4, CDKN1B, CDKN2A, CEBPA, CHEK2, CTNNA1, DDX41, DICER1, ETV6, FH, FLCN, GATA2, LZTR1, MAX, MBD4, MEN1, MET, MLH1, MSH2, MSH3, MSH6, MUTYH, NF1, NF2, NTHL1, PALB2, PHOX2B, PMS2, POT1, PRKAR1A, PTCH1, PTEN, RAD51C, RAD51D, RB1, RET, RPS20, RUNX1, SDHA, SDHAF2, SDHB, SDHC, SDHD, SMAD4, SMARCA4, SMARCB1, SMARCE1, STK11, SUFU, TMEM127, TP53, TSC1, TSC2, VHL, and WT1 (sequencing and deletion/duplication); EGFR, HOXB13, KIT, MITF, PDGFRA, POLD1, and POLE (sequencing only); EPCAM and GREM1 (deletion/duplication only).    08/29/2024 - 08/29/2024 Chemotherapy   Patient is on Treatment Plan : ANUS Mitomycin  D1,29 + Capecitabine  + XRT  Invasive carcinoma of breast (HCC)  06/12/2024 Imaging   CT chest abdomen pelvis with contrast showed Slight thickening in the area of the anal canal. Please correlate for history of neoplasm. There is a pathologic enlarged left inguinal node. There is a borderline right-sided node.   No additional areas of abnormal nodal enlargement or other aggressive appearing mass lesion at this time.   Fatty liver infiltration with known segment 7 hepatic hemangioma.   Of note there is a right-sided upper breast mass which was not clearly seen on prior CT scan. Please correlate for any prior study or if needed diagnostic mammographic evaluation and possible ultrasound when appropriate.   06/17/2024 Mammogram   1. There is a suspicious 17 mm  mass at the site of CT and mammographic concern in the RIGHT breast. It demonstrates associated pleomorphic calcifications. Recommend ultrasound-guided biopsy for definitive characterization. 2. There is incidental sonographic note of a 7 mm mass in the RIGHT breast at 12 o'clock and a 6 mm non mass area at 9 o'clock. Recommend ultrasound-guided biopsy of these 2 areas for definitive characterization given suspicious appearance of the dominant mass. 3. No suspicious RIGHT axillary adenopathy. 4. No mammographic evidence of malignancy in the LEFT breast.   06/18/2024 Cancer Staging   Staging form: Breast, AJCC 8th Edition - Clinical stage from 06/18/2024: Stage IB (cT1b, cN0, cM0, G3, ER-, PR-, HER2-) - Signed by Babara Call, MD on 06/26/2024 Stage prefix: Initial diagnosis Histologic grading system: 3 grade system   06/18/2024 Initial Diagnosis   Invasive carcinoma of breast [facility])  Patient with right breast biopsy.  Pathology showed 1. Breast, right, needle core biopsy, 12:00 12cmfn (heart clip) :      INVASIVE DUCTAL CARCINOMA      TUBULE FORMATION: SCORE 3      NUCLEAR PLEOMORPHISM: SCORE 3      MITOTIC COUNT: SCORE 2      TOTAL SCORE: 8      OVERALL GRADE: 3      LYMPHOVASCULAR INVASION: NOT IDENTIFIED      CANCER LENGTH: 1.0 CM      CALCIFICATIONS: NOT IDENTIFIED      OTHER FINDINGS: NONE      ER negative, PR negative, HER2 negative (IHC 0) Ki-67 95%.       2. Breast, right, needle core biopsy, 12:00 8cmfn (coil clip) :      INVASIVE DUCTAL CARCINOMA      DUCTAL CARCINOMA IN SITU, SOLID, HIGH NUCLEAR GRADE WITH NECROSIS      TUBULE FORMATION: SCORE 3      NUCLEAR PLEOMORPHISM: SCORE 3      MITOTIC COUNT: SCORE 2      TOTAL SCORE: 8      OVERALL GRADE: 3      LYMPHOVASCULAR INVASION: NOT IDENTIFIED      CANCER LENGTH: 0.3 CM      CALCIFICATIONS: NOT IDENTIFIED      OTHER FINDINGS: NONE            3. Breast, right, needle core biopsy, 9:00 12cmfn (venus) :      BENIGN BREAST  TISSUE WITH DENSE STROMAL FIBROSIS.      NEGATIVE FOR ATYPIA OR MALIGNANCY.    07/08/2024 Surgery   Patient went right breast lumpectomy and sentinel lymph node biopsy.  1. 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      - DUCTAL CARCINOMA IN SITU (DCIS).      -  SEE CANCER SUMMARY AND NOTE BELOW.      - TWO BIOPSY SITES WITH CORRESPONDING HEART AND COIL CLIPS.      - TWO SAVI SCOUT TAGS.       2. Lymph node, sentinel, biopsy, Right :      - MICROMETASTATIC CARCINOMA INVOLVES ONE OF FIVE LYMPH NODES (1MI/5).      - SEE CANCER SUMMARY AND NOTE BELOW.       3. Breast, excision, Right medial posterior margin :      - BENIGN BREAST TISSUE.      - NEGATIVE FOR ATYPIA AND MALIGNANCY.      - SEE CANCER SUMMARY BELOW.   TUMOR Histologic Type: Invasive carcinoma of no special type (ductal) Histologic Grade (Nottingham Histologic Score) Glandular (Acinar)/Tubular Differentiation: 3 Nuclear Pleomorphism: 3 Mitotic Rate: 3 Overall Grade: 3 Tumor Size: Greatest dimension of largest invasive focus: 19 mm Ductal Carcinoma In Situ (DCIS): Present, high-grade with comedonecrosis Lymphatic and/or Vascular Invasion: Present, extensive (LVI in 2 or more blocks)  Treatment Effect in the Breast: No known presurgical therapy MARGINS Margin Status for Invasive Carcinoma: Invasive Carcinoma involves inferior (unifocal) and superior (unifocal) margins and is 0.5 mm to anterior margin Margin Status for DCIS: All margins negative for DCIS Distance from DCIS to closest margin: 0.5 mm Specify closest margin: Inferior REGIONAL LYMPH NODES Regional Lymph Node Status: Tumor present in regional lymph node(s) Number of Lymph Nodes with Macrometastases (greater than 2 mm): 0 Number of Lymph Nodes with Micrometastases (greater than 0.2 mm to 2 mm and/or greater than 200 cells): 1 Number of Lymph Nodes with Isolated Tumor Cells (0.2 mm or less OR  200 cells or less): 0 Size of Largest Metastatic Deposit: 1.9 mm Extranodal Extension: Not identified Total Number of Lymph Nodes Examined (sentinel and non-sentinel): 5 Number of Sentinel Nodes Examined: 5 DISTANT METASTASIS Distant Site(s) Involved, if applicable: Not applicable PATHOLOGIC STAGE CLASSIFICATION (pTNM, AJCC 8th Edition): Modified Classification: Not applicable pT Category: pT1c T Suffix: (m) multiple primary synchronous tumors in a single organ pN Category: pN76mi N Suffix: (sn) pM Category: Not applicable SPECIAL STUDIES Breast Biomarker Testing Performed on Previous Biopsy: [id number] (heart) Estrogen Receptor: Negative (0%) Progesterone Receptor: Negative (0%) HER2 IHC: Negative (0) Proliferation Marker Ki67: 95%   07/22/2024 Cancer Staging   Staging form: Breast, AJCC 8th Edition - Pathologic stage from 07/22/2024: Stage IB (pT1c, pN7mi(sn), cM0, G3, ER-, PR-, HER2-) - Signed by Babara Call, MD on 07/22/2024 Stage prefix: Initial diagnosis Method of lymph node assessment: Sentinel lymph node biopsy Multigene prognostic tests performed: None Histologic grading system: 3 grade system   07/25/2024 Surgery   Patient reports additional excision for positive margin.  1. Breast, excision, superior margin of right breast - RESIDUAL INVASIVE DUCTAL CARCINOMA, COMPLETELY EXCISED. - CARCINOMA PRESENT 2 MM FROM THE POSTERIOR MARGIN AND 4 MM FROM THE ANTERIOR AND SUPERIOR MARGINS. 2. Breast, excision, inferior margin of right breast - RESIDUAL INVASIVE DUCTAL CARCINOMA, COMPLETELY EXCISED. - CARCINOMA PRESENT 1 MM FROM THE NEW INFERIOR MARGIN.   08/23/2024 Genetic Testing   Negative genetic testing. No pathogenic variants identified on the [facility] CancerNext-Expanded+RNA Panel. The report date is 08/23/2024.  The CancerNext-Expanded gene panel offered by [facility] and includes sequencing, rearrangement, and RNA analysis for the following 77 genes: AIP, ALK, APC, ATM,  AXIN2, BAP1, BARD1, BMPR1A, BRCA1, BRCA2, BRIP1, CDC73, CDH1, CDK4, CDKN1B, CDKN2A, CEBPA, CHEK2, CTNNA1, DDX41, DICER1, ETV6, FH, FLCN, GATA2, LZTR1, MAX, MBD4, MEN1, MET, MLH1, MSH2, MSH3, MSH6,  MUTYH, NF1, NF2, NTHL1, PALB2, PHOX2B, PMS2, POT1, PRKAR1A, PTCH1, PTEN, RAD51C, RAD51D, RB1, RET, RPS20, RUNX1, SDHA, SDHAF2, SDHB, SDHC, SDHD, SMAD4, SMARCA4, SMARCB1, SMARCE1, STK11, SUFU, TMEM127, TP53, TSC1, TSC2, VHL, and WT1 (sequencing and deletion/duplication); EGFR, HOXB13, KIT, MITF, PDGFRA, POLD1, and POLE (sequencing only); EPCAM and GREM1 (deletion/duplication only).    10/23/2024 -  Chemotherapy   Patient is on Treatment Plan : BREAST DOSE DENSE AC q14d / PACLitaxel q7d      Discussed the use of AI scribe software for clinical note transcription with the patient, who gave verbal consent to proceed.   Patient has a history of VIN 3 status post vulvectomy  07/11/2024,she was evaluated by cardiology with palpitations and atypical chest pain. Chest pain deemed atypical and she went on to have a normal echo at 07/19/2024.   07/23/2024, patient was seen by symptom management for right neck pain.  She was seen by Tinnie Dawn.  Right upper extremity ultrasound was obtained which showed acute occlusive DVT involving the right internal jugular and subclavian veins.  Patient was started on Lovenox , she was supposed to be started on 1 mg/kg twice daily however was started off 40 mg twice daily which is a subtherapeutic dosage. She was asked to hold off the evening dose prior to her breast re-excision.  07/29/2024, patient presented emergency room due to shortness of breath and chest pain.  CTA angiogram showed subsegmental PE medially in the left lower lobe.  Shortness of breath was felt to be multifactorial secondary to asthma/bronchitis, pulmonary embolism.  Symptoms are better after supportive care and she was discharged on Lovenox  80 mg subcutaneously twice daily.  08/31/2024 -09/06/2024 hospitalized due  to sepsis due to multifocal pneumonia, UTI. Respiratory viral panel positive for metapneumovirus  Patient resumed on radiation on 09/16/24.  Patient took Xeloda  twice daily and continued radiation till 09/19/2024.  She was not able to continue radiation on 09/20/2024 due to severe diarrhea.  11/7 radiation was not given due to patient's symptoms Final radiation was done on 09/25/2024. Last dose of Xeloda  was omitted.     INTERVAL HISTORY Monica Gonzalez is a 48 y.o. female who has above history reviewed by me today presents for follow up visit prior to chemo  She had picc line removed and a mediport placed by vascular surgeon. The incision area remains sore with mild erythema but is improving; she uses oxycodone  as needed for pain and discontinued the fentanyl  patch due to lack of efficacy.  She continues to have watery diarrhea and occasional fecal incontinence, with two episodes of uncontrolled bowel movements in the past week but none in the last 48 hours. Diarrhea recurs when she stops daily Lomotil , which she typically uses three times per day;  Intermittent nausea persists several days per week, with reduced appetite and minimal oral intake. She uses Phenergan  and Compazine  as needed, with Zofran  noted to be ineffective. She has to force herself to eat and is awaiting nutrition consultation. Fatigue is ongoing  Rectal pain, has resolved.   MEDICAL HISTORY:  Past Medical History:  Diagnosis Date   Anemia    Anginal pain    Anxiety    Arthritis    Asthma    Cancer (HCC)    rectal cancer, breast cancer 2025   Chronic pain syndrome 05/21/2023   DDD (degenerative disc disease), cervical 05/22/2023   Deep venous thrombosis (HCC)    Depression    Dyspnea    Fibromyalgia    GERD (gastroesophageal reflux  disease)    Headache    Heart murmur    History of hiatal hernia    History of kidney stones    Lupus    Malignant neoplasm of upper-outer quadrant of right female breast, unspecified  estrogen receptor status (HCC) 2025   Pneumonia    PTSD (post-traumatic stress disorder)    Skin candidiasis 05/29/2024   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    SURGICAL HISTORY: Past Surgical History:  Procedure Laterality Date   BREAST BIOPSY Right 06/18/2024   US  RT BREAST BX W LOC DEV 1ST LESION IMG BX SPEC US  GUIDE 06/18/2024 [facility]   BREAST BIOPSY Right 06/18/2024   US  RT BREAST BX W LOC DEV EA ADD LESION IMG BX SPEC US  GUIDE 06/18/2024 [facility]   BREAST BIOPSY Right 06/18/2024   US  RT BREAST BX W LOC DEV EA ADD LESION IMG BX SPEC US  GUIDE 06/18/2024 [facility]   BREAST BIOPSY Right 07/05/2024   US  RT BREAST SAVI/RF TAG 1ST LESION US  GUIDE 07/05/2024 [facility]   BREAST BIOPSY Right 07/05/2024   US  RT BREAST SAVI/RF TAG EA ADD'L LESION US  GUIDE 07/05/2024 [facility]   BREAST LUMPECTOMY Right 07/25/2024   Procedure: BREAST LUMPECTOMY;  Surgeon: Tye Millet, DO;  Location: [facility];  Service: General;  Laterality: Right;   BREAST LUMPECTOMY WITH RADIO FREQUENCY LOCALIZER Right 07/08/2024   Procedure: BREAST LUMPECTOMY WITH RADIO FREQUENCY LOCALIZER;  Surgeon: Tye Millet, DO;  Location: [facility];  Service: General;  Laterality: Right;   COLONOSCOPY WITH PROPOFOL  N/A 02/17/2023   Procedure: COLONOSCOPY WITH PROPOFOL ;  Surgeon: Maryruth Ole DASEN, MD;  Location: [facility];  Service: Endoscopy;  Laterality: N/A;   ESOPHAGOGASTRODUODENOSCOPY (EGD) WITH PROPOFOL  N/A 02/17/2023   Procedure: ESOPHAGOGASTRODUODENOSCOPY (EGD) WITH PROPOFOL ;  Surgeon: Maryruth Ole DASEN, MD;  Location: [facility];  Service: Endoscopy;  Laterality: N/A;   EXTRACORPOREAL SHOCK WAVE LITHOTRIPSY     IR CV LINE INJECTION  08/21/2024   IR REMOVAL TUN ACCESS W/ PORT W/O FL MOD SED  08/28/2024   PICC LINE INSERTION N/A 08/2024   PORTA CATH INSERTION N/A 10/14/2024   Procedure: PORTA CATH INSERTION;  Surgeon: Marea Selinda RAMAN, MD;  Location: [facility];  Service: Cardiovascular;  Laterality: N/A;   PORTA CATH REMOVAL N/A 10/14/2024   Procedure: PORTA CATH REMOVAL;  Surgeon: Marea Selinda RAMAN, MD;  Location: [facility];  Service: Cardiovascular;  Laterality: N/A;   PORTACATH PLACEMENT Right 07/08/2024   Procedure: INSERTION, TUNNELED CENTRAL VENOUS DEVICE, WITH PORT;  Surgeon: Tye Millet, DO;  Location: [facility];  Service: General;  Laterality: Right;   RECTAL EXAM UNDER ANESTHESIA N/A 05/16/2024   Procedure: EXAM UNDER ANESTHESIA, RECTUM, INCISIONAL BIOPSY OF ANAL MASS;  Surgeon: Tye Millet, DO;  Location: [facility];  Service: General;  Laterality: N/A;   SENTINEL NODE BIOPSY Right 07/08/2024   Procedure: BIOPSY, LYMPH NODE, SENTINEL;  Surgeon: Tye Millet, DO;  Location: [facility];  Service: General;  Laterality: Right;   TONSILLECTOMY     TUBAL LIGATION     VULVECTOMY N/A 07/29/2020   Procedure: WIDE EXCISION VULVECTOMY;  Surgeon: Mancil Barter, MD;  Location: [facility];  Service: Gynecology;  Laterality: N/A;    SOCIAL HISTORY: Social History   Socioeconomic History   Marital status: Divorced    Spouse name: Not on file   Number of children: 1   Years of education: Not on file   Highest education level: Not on file  Occupational History  Not on file  Tobacco Use   Smoking status: Former    Types: Cigarettes   Smokeless tobacco: Never  Vaping Use   Vaping status: Never Used  Substance and Sexual Activity   Alcohol use: Not Currently   Drug use: No   Sexual activity: Yes  Other Topics Concern   Not on file  Social History Narrative   Not on file   Social Drivers of Health   Financial Resource Strain: Medium Risk (05/15/2024)   Received from [facility]   Overall Financial Resource Strain (CARDIA)    Difficulty of Paying Living Expenses: Somewhat hard  Food Insecurity: No Food Insecurity (09/07/2024)   Hunger Vital Sign    Worried About Running Out of Food in the Last Year: Never true     Ran Out of Food in the Last Year: Never true  Recent Concern: Food Insecurity - Food Insecurity Present (08/22/2024)   Hunger Vital Sign    Worried About Running Out of Food in the Last Year: Sometimes true    Ran Out of Food in the Last Year: Sometimes true  Transportation Needs: No Transportation Needs (09/07/2024)   PRAPARE - Administrator, Civil Service (Medical): No    Lack of Transportation (Non-Medical): No  Physical Activity: Not on file  Stress: Not on file  Social Connections: Not on file  Intimate Partner Violence: Not At Risk (09/07/2024)   Humiliation, Afraid, Rape, and Kick questionnaire    Fear of Current or Ex-Partner: No    Emotionally Abused: No    Physically Abused: No    Sexually Abused: No    FAMILY HISTORY: Family History  Problem Relation Age of Onset   Diabetes Mother    Congestive Heart Failure Mother    Colon cancer Father 54   Lupus Maternal Grandmother    Cancer Maternal Grandfather        either prostate or colon   Colon cancer Paternal Grandfather    Lung cancer Paternal Grandfather     ALLERGIES:  is allergic to prednisone and silicone.  MEDICATIONS:  Current Outpatient Medications  Medication Sig Dispense Refill   acetaminophen  (TYLENOL ) 500 MG tablet Take 500 mg by mouth every 6 (six) hours as needed.     albuterol  (VENTOLIN  HFA) 108 (90 Base) MCG/ACT inhaler Inhale 2 puffs into the lungs every 4 (four) hours as needed for wheezing or shortness of breath. 8.5 g 2   ascorbic acid (VITAMIN C) 500 MG tablet Take 500 mg by mouth daily.     azelastine  (ASTELIN ) 0.1 % nasal spray Place 1 spray into both nostrils 2 (two) times daily. Use in each nostril as directed 30 mL 0   benzonatate  (TESSALON ) 200 MG capsule Take 1 capsule (200 mg total) by mouth 3 (three) times daily as needed for cough. 30 capsule 1   BIOTIN PO Take by mouth daily.     budesonide  (PULMICORT ) 0.25 MG/2ML nebulizer solution Take 2 mLs (0.25 mg total) by  nebulization 2 (two) times daily. 60 mL 12   busPIRone  (BUSPAR ) 5 MG tablet Take 5 mg by mouth 2 (two) times daily.     cholecalciferol (VITAMIN D3) 25 MCG (1000 UNIT) tablet Take 1,000 Units by mouth daily.     diphenoxylate -atropine  (LOMOTIL ) 2.5-0.025 MG tablet Take 1 tablet by mouth 4 (four) times daily. 60 tablet 0   DULoxetine  (CYMBALTA ) 30 MG capsule Take 30 mg by mouth at bedtime.     enoxaparin  (LOVENOX ) 80  MG/0.8ML injection Inject 0.8 mLs (80 mg total) into the skin every 12 (twelve) hours. 48 mL 2   gabapentin  (NEURONTIN ) 300 MG capsule Take 600 mg by mouth 2 (two) times daily.     ibuprofen  (ADVIL ) 200 MG tablet Take 200 mg by mouth every 6 (six) hours as needed.     ipratropium-albuterol  (DUONEB) 0.5-2.5 (3) MG/3ML SOLN Take 3 mLs by nebulization 4 (four) times daily. 360 mL 3   lidocaine -prilocaine  (EMLA ) cream Apply 1 Application topically as needed. 30 g 3   linaclotide (LINZESS) 145 MCG CAPS capsule Take 145 mcg by mouth daily before breakfast.     LORazepam  (ATIVAN ) 0.5 MG tablet Take 1 tablet (0.5 mg total) by mouth every 12 (twelve) hours as needed for anxiety (nausea). 30 tablet 0   montelukast  (SINGULAIR ) 10 MG tablet Take 1 tablet (10 mg total) by mouth See admin instructions. Take 1 tablet on the day prior to chemotherapy and take 1 tablet daily for 2 days after chemotherapy. 30 tablet 0   Multiple Vitamin (MULTIVITAMIN WITH MINERALS) TABS tablet Take 1 tablet by mouth daily.     naloxone  (NARCAN ) nasal spray 4 mg/0.1 mL SPRAY 1 SPRAY INTO ONE NOSTRIL AS DIRECTED FOR OPIOID OVERDOSE (TURN PERSON ON SIDE AFTER DOSE. IF NO RESPONSE IN 2-3 MINUTES OR PERSON RESPONDS BUT RELAPSES, REPEAT USING A NEW SPRAY DEVICE AND SPRAY INTO THE OTHER NOSTRIL. CALL 911 AFTER USE.) * EMERGENCY USE ONLY * 1 each 0   OLANZapine  (ZYPREXA ) 5 MG tablet Take 1 tablet (5 mg total) by mouth at bedtime. 30 tablet 3   oxyCODONE  (OXY IR/ROXICODONE ) 5 MG immediate release tablet Take 1 tablet (5 mg total)  by mouth every 6 (six) hours as needed for severe pain (pain score 7-10) or breakthrough pain. 15 tablet 0   prochlorperazine  (COMPAZINE ) 10 MG tablet Take 1 tablet (10 mg total) by mouth every 6 (six) hours as needed for nausea or vomiting. 30 tablet 1   promethazine  (PHENERGAN ) 25 MG tablet Take 1 tablet (25 mg total) by mouth every 8 (eight) hours as needed for nausea or vomiting. 60 tablet 0   traZODone  (DESYREL ) 50 MG tablet Take 50 mg by mouth at bedtime.     dexamethasone  (DECADRON ) 4 MG tablet Take 2 tablets (8 mg total) by mouth daily for 2 days. Start the day after doxorubicin /cyclophosphamide  chemotherapy. Take with food. (Patient not taking: Reported on 10/23/2024) 30 tablet 1   esomeprazole  (NEXIUM ) 40 MG capsule Take 1 capsule (40 mg total) by mouth 2 (two) times daily before a meal. (Patient not taking: Reported on 10/23/2024) 60 capsule 2   nystatin  (MYCOSTATIN ) 100000 UNIT/ML suspension Take 5 mLs (500,000 Units total) by mouth 4 (four) times daily. Swish and swallow (Patient not taking: Reported on 10/23/2024) 473 mL 1   potassium chloride  SA (KLOR-CON  M) 20 MEQ tablet Take 1 tablet (20 mEq total) by mouth 2 (two) times daily. 60 tablet 0   Turmeric (QC TUMERIC COMPLEX PO) Take by mouth daily. (Patient not taking: Reported on 10/23/2024)     No current facility-administered medications for this visit.   Facility-Administered Medications Ordered in Other Visits  Medication Dose Route Frequency Provider Last Rate Last Admin   0.9 %  sodium chloride  infusion   Intravenous Continuous Babara Call, MD 10 mL/hr at 10/23/24 0940 New Bag at 10/23/24 0940   cyclophosphamide  (CYTOXAN ) 1,160 mg in sodium chloride  0.9 % 250 mL chemo infusion  600 mg/m2 (Treatment Plan Recorded) Intravenous Once  Babara Call, MD        Review of Systems  Constitutional:  Positive for fatigue. Negative for chills and fever.  HENT:   Negative for hearing loss and voice change.        Nasal congestion  Eyes:   Negative for eye problems.  Respiratory:  Negative for chest tightness, cough and shortness of breath.   Cardiovascular:  Negative for chest pain.  Gastrointestinal:  Positive for diarrhea and nausea. Negative for abdominal distention, abdominal pain and blood in stool.  Endocrine: Negative for hot flashes.  Genitourinary:  Negative for difficulty urinating and frequency.   Musculoskeletal:  Negative for arthralgias.  Skin:  Negative for itching and rash.  Neurological:  Negative for extremity weakness.  Hematological:  Negative for adenopathy.  Psychiatric/Behavioral:  Negative for confusion.      PHYSICAL EXAMINATION: ECOG PERFORMANCE STATUS: 0 - Asymptomatic  Vitals:   10/23/24 0858  BP: 106/72  Pulse: 97  Resp: 18  Temp: (!) 96 F (35.6 C)  SpO2: 100%   Filed Weights   10/23/24 0858  Weight: 183 lb 11.2 oz (83.3 kg)    Physical Exam Constitutional:      General: She is not in acute distress.    Appearance: She is not diaphoretic.  HENT:     Head: Normocephalic and atraumatic.     Mouth/Throat:     Pharynx: No oropharyngeal exudate.  Eyes:     General: No scleral icterus. Cardiovascular:     Rate and Rhythm: Normal rate and regular rhythm.     Heart sounds: No murmur heard. Pulmonary:     Effort: Pulmonary effort is normal. No respiratory distress.     Breath sounds: No wheezing.  Abdominal:     General: There is no distension.     Palpations: Abdomen is soft.     Tenderness: There is no abdominal tenderness.  Musculoskeletal:        General: Normal range of motion.     Cervical back: Normal range of motion and neck supple.  Skin:    General: Skin is warm and dry.     Findings: No erythema.     Comments: Left chest wall medi port, mild erythematous changes at the incision site.   Neurological:     Mental Status: She is alert and oriented to person, place, and time. Mental status is at baseline.     Motor: No abnormal muscle tone.  Psychiatric:         Mood and Affect: Affect normal.         LABORATORY DATA:  I have reviewed the data as listed    Latest Ref Rng & Units 10/23/2024    8:44 AM 09/23/2024    2:04 PM 09/17/2024    3:17 PM  CBC  WBC 4.0 - 10.5 K/uL 5.2  5.0  4.6   Hemoglobin 12.0 - 15.0 g/dL 87.7  88.9  89.9   Hematocrit 36.0 - 46.0 % 38.0  33.8  31.7   Platelets 150 - 400 K/uL 346  322  285       Latest Ref Rng & Units 10/23/2024    8:44 AM 09/30/2024    8:35 AM 09/23/2024    2:04 PM  CMP  Glucose 70 - 99 mg/dL 874  859  890   BUN 6 - 20 mg/dL 6  8  5    Creatinine 0.44 - 1.00 mg/dL 9.10  9.17  9.28   Sodium 135 - 145 mmol/L  139  137  135   Potassium 3.5 - 5.1 mmol/L 4.1  3.7  3.1   Chloride 98 - 111 mmol/L 104  106  105   CO2 22 - 32 mmol/L 23  21  23    Calcium 8.9 - 10.3 mg/dL 9.5  8.8  8.6   Total Protein 6.5 - 8.1 g/dL 6.5  6.2  6.3   Total Bilirubin 0.0 - 1.2 mg/dL 0.6  1.0  0.7   Alkaline Phos 38 - 126 U/L 90  82  80   AST 15 - 41 U/L 30  85  75   ALT 0 - 44 U/L 21  85  44      RADIOGRAPHIC STUDIES: I have personally reviewed the radiological images as listed and agreed with the findings in the report. PERIPHERAL VASCULAR CATHETERIZATION Result Date: 10/14/2024 See surgical note for result.  ECHOCARDIOGRAM COMPLETE Result Date: 10/09/2024    ECHOCARDIOGRAM REPORT   Patient Name:   QUANDA PAVLICEK Newbern Date of Exam: 10/09/2024 Medical Rec #:  [id number]     Height:       59.0 in Accession #:    [id number]    Weight:       186.0 lb Date of Birth:  [birth year]      BSA:          1.788 m Patient Age:    48 years      BP:           114/71 mmHg Patient Gender: F             HR:           104 bpm. Exam Location:  [facility] Procedure: 2D Echo, Cardiac Doppler, Color Doppler, 3D Echo and Strain Analysis            (Both Spectral and Color Flow Doppler were utilized during            procedure). Indications:     Chemo Z09  History:         Patient has no prior history of Echocardiogram examinations.                   Signs/Symptoms:Dyspnea and Murmur. Angina.  Sonographer:     Christopher Furnace Referring Phys:  [id number] Eoghan Belcher Diagnosing Phys: Evalene Lunger MD  Sonographer Comments: Global longitudinal strain was attempted. IMPRESSIONS  1. Left ventricular ejection fraction, by estimation, is 60 to 65%. The left ventricle has normal function. The left ventricle has no regional wall motion abnormalities. Left ventricular diastolic parameters are consistent with Grade I diastolic dysfunction (impaired relaxation).  2. Right ventricular systolic function is normal. The right ventricular size is normal. There is normal pulmonary artery systolic pressure. The estimated right ventricular systolic pressure is 15.8 mmHg.  3. The mitral valve is normal in structure. No evidence of mitral valve regurgitation. No evidence of mitral stenosis.  4. The aortic valve is normal in structure. Aortic valve regurgitation is not visualized. No aortic stenosis is present.  5. The inferior vena cava is normal in size with greater than 50% respiratory variability, suggesting right atrial pressure of 3 mmHg. FINDINGS  Left Ventricle: Left ventricular ejection fraction, by estimation, is 60 to 65%. The left ventricle has normal function. The left ventricle has no regional wall motion abnormalities. The global longitudinal strain is normal despite suboptimal segment tracking. The left ventricular internal cavity size was normal in size. There is no left ventricular hypertrophy. Left ventricular diastolic  parameters are consistent with Grade I diastolic dysfunction (impaired relaxation). Right Ventricle: The right ventricular size is normal. No increase in right ventricular wall thickness. Right ventricular systolic function is normal. There is normal pulmonary artery systolic pressure. The tricuspid regurgitant velocity is 1.64 m/s, and  with an assumed right atrial pressure of 5 mmHg, the estimated right ventricular systolic pressure is 15.8 mmHg. Left Atrium:  Left atrial size was normal in size. Right Atrium: Right atrial size was normal in size. Pericardium: There is no evidence of pericardial effusion. Mitral Valve: The mitral valve is normal in structure. No evidence of mitral valve regurgitation. No evidence of mitral valve stenosis. MV peak gradient, 3.1 mmHg. The mean mitral valve gradient is 2.0 mmHg. Tricuspid Valve: The tricuspid valve is normal in structure. Tricuspid valve regurgitation is mild . No evidence of tricuspid stenosis. Aortic Valve: The aortic valve is normal in structure. Aortic valve regurgitation is not visualized. No aortic stenosis is present. Aortic valve mean gradient measures 7.0 mmHg. Aortic valve peak gradient measures 12.1 mmHg. Aortic valve area, by VTI measures 2.33 cm. Pulmonic Valve: The pulmonic valve was normal in structure. Pulmonic valve regurgitation is not visualized. No evidence of pulmonic stenosis. Aorta: The aortic root is normal in size and structure. Venous: The inferior vena cava is normal in size with greater than 50% respiratory variability, suggesting right atrial pressure of 3 mmHg. IAS/Shunts: No atrial level shunt detected by color flow Doppler. Additional Comments: 3D was performed not requiring image post processing on an independent workstation and was indeterminate.  LEFT VENTRICLE PLAX 2D LVIDd:         4.00 cm   Diastology LVIDs:         2.90 cm   LV e' medial:    6.09 cm/s LV PW:         1.00 cm   LV E/e' medial:  12.0 LV IVS:        0.90 cm   LV e' lateral:   10.40 cm/s LVOT diam:     2.00 cm   LV E/e' lateral: 7.0 LV SV:         55 LV SV Index:   31 LVOT Area:     3.14 cm LV IVRT:       92 msec  RIGHT VENTRICLE RV Basal diam:  3.70 cm     PULMONARY VEINS RV Mid diam:    2.80 cm     Diastolic Velocity: 20.30 cm/s RV S prime:     14.40 cm/s  S/D Velocity:       1.90 TAPSE (M-mode): 1.6 cm      Systolic Velocity:  39.10 cm/s LEFT ATRIUM           Index        RIGHT ATRIUM           Index LA diam:      2.60  cm 1.45 cm/m   RA Area:     13.60 cm LA Vol (A2C): 30.6 ml 17.11 ml/m  RA Volume:   36.60 ml  20.47 ml/m LA Vol (A4C): 17.2 ml 9.62 ml/m  AORTIC VALVE AV Area (Vmax):    1.97 cm AV Area (Vmean):   1.99 cm AV Area (VTI):     2.33 cm AV Vmax:           174.00 cm/s AV Vmean:          124.500 cm/s AV VTI:  0.236 m AV Peak Grad:      12.1 mmHg AV Mean Grad:      7.0 mmHg LVOT Vmax:         109.00 cm/s LVOT Vmean:        78.800 cm/s LVOT VTI:          0.175 m LVOT/AV VTI ratio: 0.74  AORTA Ao Root diam: 2.60 cm MITRAL VALVE               TRICUSPID VALVE MV Area (PHT): 4.01 cm    TR Peak grad:   10.8 mmHg MV Area VTI:   3.46 cm    TR Vmax:        164.00 cm/s MV Peak grad:  3.1 mmHg MV Mean grad:  2.0 mmHg    SHUNTS MV Vmax:       0.88 m/s    Systemic VTI:  0.18 m MV Vmean:      59.4 cm/s   Systemic Diam: 2.00 cm MV Decel Time: 189 msec MV E velocity: 72.80 cm/s MV A velocity: 85.40 cm/s MV E/A ratio:  0.85 Evalene Lunger MD Electronically signed by Evalene Lunger MD Signature Date/Time: 10/09/2024/2:08:14 PM    Final    VAS US  UPPER EXTREMITY VENOUS DUPLEX Result Date: 10/07/2024 UPPER VENOUS STUDY  Patient Name:  KITIARA HINTZE Coolman  Date of Exam:   10/03/2024 Medical Rec #: [id number]      Accession #:    [id number] Date of Birth: Aug 16, 1976       Patient Gender: F Patient Age:   22 years Exam Location:  [facility] Procedure:      VAS US  UPPER EXTREMITY VENOUS DUPLEX Referring Phys: ORVIN DARING --------------------------------------------------------------------------------  Indications: F/U DVT Rt UE Risk Factors: DVT 07/23/2024: DVT Rt IJV and SCV. Comparison Study: 07/23/2024 Performing Technologist: Leafy Gibes RVS  Examination Guidelines: A complete evaluation includes B-mode imaging, spectral Doppler, color Doppler, and power Doppler as needed of all accessible portions of each vessel. Bilateral testing is considered an integral part of a complete examination. Limited examinations  for reoccurring indications may be performed as noted.  Right Findings: +----------+------------+---------+-----------+----------+-------+ RIGHT     CompressiblePhasicitySpontaneousPropertiesSummary +----------+------------+---------+-----------+----------+-------+ IJV           Full       Yes       Yes                      +----------+------------+---------+-----------+----------+-------+ Subclavian    Full       Yes       Yes                      +----------+------------+---------+-----------+----------+-------+ Axillary      Full       Yes       Yes                      +----------+------------+---------+-----------+----------+-------+ Brachial      Full       Yes       Yes                      +----------+------------+---------+-----------+----------+-------+ Radial        Full       Yes       Yes                      +----------+------------+---------+-----------+----------+-------+ Ulnar  Full       Yes       Yes                      +----------+------------+---------+-----------+----------+-------+ Cephalic      Full       Yes       Yes                      +----------+------------+---------+-----------+----------+-------+ Basilic       Full       Yes       Yes                      +----------+------------+---------+-----------+----------+-------+  Left Findings: +----+------------+---------+-----------+----------+-------+ LEFTCompressiblePhasicitySpontaneousPropertiesSummary +----+------------+---------+-----------+----------+-------+ IJV     Full       Yes       Yes                      +----+------------+---------+-----------+----------+-------+  Summary:  Right: No evidence of deep vein thrombosis in the upper extremity. No evidence of superficial vein thrombosis in the upper extremity. No evidence of thrombosis in the subclavian. Findings for deep vein thrombosis appear to be resolved from previous study.  *See table(s)  above for measurements and observations.  Diagnosing physician: Cordella Shawl MD Electronically signed by Cordella Shawl MD on 10/07/2024 at 10:05:29 AM.    Final

## 2024-10-23 NOTE — Assessment & Plan Note (Signed)
 Continue Lovenox 1 mg/kg twice daily

## 2024-10-23 NOTE — Assessment & Plan Note (Signed)
 Secondary to chemotherapy and radiation.  Recommend Lomotil  1 tablet p.o. 4 times daily PRN

## 2024-10-24 ENCOUNTER — Other Ambulatory Visit: Payer: Self-pay

## 2024-10-24 MED ORDER — BOOST PLUS PO LIQD: 237.0000 mL | Freq: Two times a day (BID) | ORAL | 11 refills | Status: AC

## 2024-10-25 ENCOUNTER — Inpatient Hospital Stay

## 2024-10-25 DIAGNOSIS — C210 Malignant neoplasm of anus, unspecified: Secondary | ICD-10-CM | POA: Diagnosis not present

## 2024-10-25 DIAGNOSIS — C50919 Malignant neoplasm of unspecified site of unspecified female breast: Secondary | ICD-10-CM

## 2024-10-25 MED ORDER — PEGFILGRASTIM-CBQV 6 MG/0.6ML ~~LOC~~ SOSY
6.0000 mg | PREFILLED_SYRINGE | Freq: Once | SUBCUTANEOUS | Status: AC
  Administered 2024-10-25: 6 mg via SUBCUTANEOUS
  Filled 2024-10-25: qty 0.6

## 2024-10-30 ENCOUNTER — Encounter: Payer: Self-pay | Admitting: Oncology

## 2024-10-30 ENCOUNTER — Inpatient Hospital Stay: Admitting: Oncology

## 2024-10-30 ENCOUNTER — Inpatient Hospital Stay

## 2024-10-30 ENCOUNTER — Ambulatory Visit
Admission: RE | Admit: 2024-10-30 | Discharge: 2024-10-30 | Disposition: A | Discharge status: Home / Self Care | Source: Ambulatory Visit | Attending: Radiation Oncology | Admitting: Radiation Oncology

## 2024-10-30 ENCOUNTER — Encounter: Payer: Self-pay | Admitting: Radiation Oncology

## 2024-10-30 ENCOUNTER — Telehealth: Payer: Self-pay

## 2024-10-30 VITALS — BP 95/79 | HR 105 | Temp 97.0°F | Resp 18 | Wt 183.4 lb

## 2024-10-30 VITALS — BP 107/73 | HR 81 | Resp 18

## 2024-10-30 DIAGNOSIS — R110 Nausea: Secondary | ICD-10-CM | POA: Diagnosis not present

## 2024-10-30 DIAGNOSIS — J4520 Mild intermittent asthma, uncomplicated: Secondary | ICD-10-CM | POA: Diagnosis not present

## 2024-10-30 DIAGNOSIS — C50411 Malignant neoplasm of upper-outer quadrant of right female breast: Secondary | ICD-10-CM | POA: Insufficient documentation

## 2024-10-30 DIAGNOSIS — T451X5A Adverse effect of antineoplastic and immunosuppressive drugs, initial encounter: Secondary | ICD-10-CM | POA: Diagnosis not present

## 2024-10-30 DIAGNOSIS — C210 Malignant neoplasm of anus, unspecified: Secondary | ICD-10-CM | POA: Diagnosis not present

## 2024-10-30 DIAGNOSIS — C50919 Malignant neoplasm of unspecified site of unspecified female breast: Secondary | ICD-10-CM

## 2024-10-30 DIAGNOSIS — Z170 Estrogen receptor positive status [ER+]: Secondary | ICD-10-CM | POA: Insufficient documentation

## 2024-10-30 DIAGNOSIS — R300 Dysuria: Secondary | ICD-10-CM | POA: Diagnosis not present

## 2024-10-30 DIAGNOSIS — D701 Agranulocytosis secondary to cancer chemotherapy: Secondary | ICD-10-CM | POA: Diagnosis not present

## 2024-10-30 DIAGNOSIS — Z923 Personal history of irradiation: Secondary | ICD-10-CM | POA: Insufficient documentation

## 2024-10-30 DIAGNOSIS — Z9221 Personal history of antineoplastic chemotherapy: Secondary | ICD-10-CM | POA: Insufficient documentation

## 2024-10-30 DIAGNOSIS — I2699 Other pulmonary embolism without acute cor pulmonale: Secondary | ICD-10-CM

## 2024-10-30 DIAGNOSIS — R197 Diarrhea, unspecified: Secondary | ICD-10-CM | POA: Insufficient documentation

## 2024-10-30 LAB — URINALYSIS, COMPLETE (UACMP) WITH MICROSCOPIC
Bilirubin Urine: NEGATIVE
Glucose, UA: NEGATIVE mg/dL
Hgb urine dipstick: NEGATIVE
Ketones, ur: NEGATIVE mg/dL
Nitrite: NEGATIVE
Protein, ur: NEGATIVE mg/dL
Specific Gravity, Urine: 1.02 (ref 1.005–1.030)
pH: 6 (ref 5.0–8.0)

## 2024-10-30 LAB — CMP (CANCER CENTER ONLY)
ALT: 34 U/L (ref 0–44)
AST: 37 U/L (ref 15–41)
Albumin: 3.6 g/dL (ref 3.5–5.0)
Alkaline Phosphatase: 92 U/L (ref 38–126)
Anion gap: 10 (ref 5–15)
BUN: 11 mg/dL (ref 6–20)
CO2: 24 mmol/L (ref 22–32)
Calcium: 9.1 mg/dL (ref 8.9–10.3)
Chloride: 103 mmol/L (ref 98–111)
Creatinine: 0.7 mg/dL (ref 0.44–1.00)
GFR, Estimated: >60 mL/min (ref >60)
Glucose, Bld: 140 mg/dL — ABNORMAL HIGH (ref 70–99)
Potassium: 4.2 mmol/L (ref 3.5–5.1)
Sodium: 137 mmol/L (ref 135–145)
Total Bilirubin: 0.6 mg/dL (ref 0.0–1.2)
Total Protein: 6 g/dL — ABNORMAL LOW (ref 6.5–8.1)

## 2024-10-30 LAB — CBC WITH DIFFERENTIAL (CANCER CENTER ONLY)
Abs Immature Granulocytes: 0.01 K/uL (ref 0.00–0.07)
Basophils Absolute: 0 K/uL (ref 0.0–0.1)
Basophils Relative: 2 %
Eosinophils Absolute: 0 K/uL (ref 0.0–0.5)
Eosinophils Relative: 4 %
HCT: 32.3 % — ABNORMAL LOW (ref 36.0–46.0)
Hemoglobin: 10.3 g/dL — ABNORMAL LOW (ref 12.0–15.0)
Immature Granulocytes: 1 %
Lymphocytes Relative: 22 %
Lymphs Abs: 0.2 K/uL — ABNORMAL LOW (ref 0.7–4.0)
MCH: 31.9 pg (ref 26.0–34.0)
MCHC: 31.9 g/dL (ref 30.0–36.0)
MCV: 100 fL (ref 80.0–100.0)
Monocytes Absolute: 0.1 K/uL (ref 0.1–1.0)
Monocytes Relative: 10 %
Neutro Abs: 0.7 K/uL — ABNORMAL LOW (ref 1.7–7.7)
Neutrophils Relative %: 61 %
Platelet Count: 110 K/uL — ABNORMAL LOW (ref 150–400)
RBC: 3.23 MIL/uL — ABNORMAL LOW (ref 3.87–5.11)
RDW: 14.1 % (ref 11.5–15.5)
Smear Review: NORMAL
WBC Count: 1.1 K/uL — ABNORMAL LOW (ref 4.0–10.5)
nRBC: 0 % (ref 0.0–0.2)

## 2024-10-30 LAB — MAGNESIUM: Magnesium: 2.1 mg/dL (ref 1.7–2.4)

## 2024-10-30 MED ORDER — LORAZEPAM 0.5 MG PO TABS: 0.5000 mg | ORAL_TABLET | Freq: Two times a day (BID) | ORAL | 0 refills | Status: AC | PRN

## 2024-10-30 MED ORDER — SODIUM CHLORIDE 0.9 % IV SOLN
Freq: Once | INTRAVENOUS | Status: AC
  Filled 2024-10-30: qty 250

## 2024-10-30 MED ORDER — CIPROFLOXACIN HCL 250 MG PO TABS: 250.0000 mg | ORAL_TABLET | Freq: Two times a day (BID) | ORAL | 0 refills | Status: DC

## 2024-10-30 NOTE — Assessment & Plan Note (Signed)
 She receives Day 3 long acting GCSF Neutropenia precaution.

## 2024-10-30 NOTE — Assessment & Plan Note (Signed)
 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  Per IR, right inguinal lymph node is not enlarged.  Previously on concurrent chemotherapy 5-FU and mitomycin  C with Radiation. S/p D1 5-FU and Mitomycin -C. D29 5-FU infusion was switched to concurrent Xeloda  due to lack of medi port.- she has picc line. Received D29 Mitomycin  C. Chemo was interrupted due to multi focal pneumonia.  S/p concurrent Xeloda  with Radiation [last RT 09/24/2024 .   Future plan - DRE 8-12 weeks after treatment - Jan 2026. I will re image after she finishes adjuvant breast chemotherapy

## 2024-10-30 NOTE — Assessment & Plan Note (Signed)
 Continue inhalers.singular.   Recommend patient to follow-up with pulmonology.

## 2024-10-30 NOTE — Assessment & Plan Note (Signed)
 Continue home antiemetics phernegan and compazine  PRN.  Added lorazepam  0.5 every 12 hours as needed anxiety and nausea.

## 2024-10-30 NOTE — Addendum Note (Signed)
 Addended by: BABARA CALL on: 10/30/2024 12:41 PM   Modules accepted: Orders

## 2024-10-30 NOTE — Progress Notes (Signed)
 Radiation Oncology Follow up Note  Name: Monica Gonzalez   Date:   10/30/2024 MRN:  [id number] DOB: Jun 14, 1976    This 48 y.o. 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  She received 5-FU Mitomycin -C.  She has done well still occasionally has slight discharge from her rectum mucus which I have assured her is normal.  She also occasionally has some diarrhea.  She is having no rectal pain at this time.  She has recently started chemotherapy with medical oncology for her triple negative breast cancer and started on ddAC.SABRA  COMPLICATIONS OF TREATMENT: none  FOLLOW UP COMPLIANCE: keeps appointments   PHYSICAL EXAM:  There were no vitals taken for this visit. On rectal exam rectal sphincter tone is good.  No evidence of mass or nodularity in anus is noted.  No inguinal adenopathy is appreciated.  Well-developed well-nourished patient in NAD. HEENT reveals PERLA, EOMI, discs not visualized.  Oral cavity is clear. No oral mucosal lesions are identified. Neck is clear without evidence of cervical or supraclavicular adenopathy. Lungs are clear to A&P. Cardiac examination is essentially unremarkable with regular rate and rhythm without murmur rub or thrill. Abdomen is benign with no organomegaly or masses noted. Motor sensory and DTR levels are equal and symmetric in the upper and lower extremities. Cranial nerves II through XII are grossly intact. Proprioception is intact. No peripheral adenopathy or edema is identified. No motor or sensory levels are noted. Crude visual  fields are within normal range.  RADIOLOGY RESULTS: CT scan in 4 months of the abdomen pelvis ordered  PLAN: At this time I have asked to see her back in 4 months for follow-up.  Probably will see her sooner after completion of chemotherapy for breast cancer for adjuvant radiation therapy to her right breast.  Will also obtain in 4 months a CT scan of her abdomen and pelvis.  Patient continues treatment for her triple negative breast cancer through medical oncology.  Patient knows to call with any concerns.  I would like to take this opportunity to thank you for allowing me to participate in the care of your patient.SABRA Marcey Penton, MD

## 2024-10-30 NOTE — Assessment & Plan Note (Signed)
 Continue Lovenox 1 mg/kg twice daily

## 2024-10-30 NOTE — Telephone Encounter (Signed)
 Per Dr. Babara UA is positive. I have sent prescription to her pharmacy. please advise her to take cipro  250mg  BID for 5 days.  Pt informed of this and will pick up medication. Advised pt to keep appt as scheduled. Pt verbalized understanding of this

## 2024-10-30 NOTE — Progress Notes (Signed)
 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 mT1c pN66mi Initial margin was positive and patient underwent reexcision and achieved a negative margin.  Recommend  adjuvant ddAC followed by weekly Taxol. Baseline Echo LVEF 60-65%.Rationale and side effects were reviewed with patient.  Labs are reviewed and discussed with patient. Proceed with cycle 1 ddAC with D3 GCSF.  She tolerated with some difficulties.  Supportive care. Hydration session today  Acute subsegmental pulmonary embolism (HCC) Continue Lovenox  1 mg/kg twice daily.  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  Per IR, right inguinal lymph node is not enlarged.  Previously on concurrent chemotherapy 5-FU and mitomycin  C with Radiation. S/p D1 5-FU and Mitomycin -C. D29 5-FU infusion was switched to concurrent Xeloda  due to lack of medi port.- she has picc line. Received D29 Mitomycin  C. Chemo was interrupted due to multi focal pneumonia.  S/p concurrent Xeloda  with Radiation [last RT 09/24/2024 .   Future plan - DRE 8-12 weeks after treatment - Jan 2026. I will re image after she finishes adjuvant breast chemotherapy  Chemotherapy-induced nausea Continue home antiemetics phernegan and compazine  PRN.  Added lorazepam  0.5 every 12 hours as needed anxiety and nausea.     Asthma Continue inhalers.singular.   Recommend patient to follow-up with pulmonology.  Dysuria Obtain UA and urine culture.  UA showed large leukocytes. Culture is pending. I will cover with empiric antibiotics. Cipro  250mg  BID x 5 days.   Chemotherapy  induced neutropenia She receives Day 3 long acting GCSF Neutropenia precaution.      Orders Placed This Encounter  Procedures   Urine Culture    Standing Status:   Future    Expected Date:   10/30/2024    Expiration Date:   01/28/2025   Urinalysis, Complete w Microscopic    Standing Status:   Future    Expected Date:   10/30/2024    Expiration Date:   01/28/2025   Follow-up 1 week.  All questions were answered. The patient knows to call the clinic with any problems, questions or concerns.  [name], MD, PhD [facility] Hematology Oncology 10/30/2024    HISTORY OF PRESENTING ILLNESS:  Monica Gonzalez 48 y.o. 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 Summary of oncologic history is as follows: Oncology History  Anal squamous cell carcinoma (HCC)  05/29/2024 Initial Diagnosis   Anal squamous cell carcinoma   Patient has noticed a hard mass at the anal area which she initially thought was a hemorrhoid.  The mass has been present for more than a month and appears to be enlarging. Patient was referred to establish care with surgeon Dr. Tye  05/16/2024, incisional biopsy of anal mass showed 1. Anus, biopsy, mass :       - INVASIVE MODERATELY DIFFERENTIATED SQUAMOUS CELL CARCINOMA  06/19/2024, left inguinal adenopathy biopsy showed Metastatic moderately differentiated squamous cell carcinoma.  '    05/29/2024 Cancer Staging   Staging form: Anus, AJCC V9 - Clinical stage from 05/29/2024: Stage IIIA (cT3, cN1a,  cM0) - Signed by Babara Call, MD on 06/26/2024 Stage prefix: Initial diagnosis   06/05/2024 Imaging   CT chest abdomen pelvis with contrast  Slight thickening in the area of the anal canal. Please correlate for history of neoplasm. There is a pathologic enlarged left inguinal node. There is a  borderline right-sided node.   No additional areas of abnormal nodal enlargement or other aggressive appearing mass lesion at this time.   Fatty liver infiltration with known segment 7 hepatic hemangioma.   Of note there is a right-sided upper breast mass which was not clearly seen on prior CT scan. Please correlate for any prior study or if needed diagnostic mammographic evaluation and possible ultrasound when appropriate.     06/12/2024 Imaging   CT chest abdomen pelvis with contrast showed Slight thickening in the area of the anal canal. Please correlate for history of neoplasm. There is a pathologic enlarged left inguinal node. There is a borderline right-sided node.   No additional areas of abnormal nodal enlargement or other aggressive appearing mass lesion at this time.   Fatty liver infiltration with known segment 7 hepatic hemangioma.   Of note there is a right-sided upper breast mass which was not clearly seen on prior CT scan. Please correlate for any prior study or if needed diagnostic mammographic evaluation and possible ultrasound when appropriate.   06/19/2024 Procedure   1. Lymph node, biopsy, left inguinal adenopathy :       -  METASTATIC MODERATELY DIFFERENTIATED SQUAMOUS CELL CARCINOMA    07/01/2024 Imaging   PET scan showed 1. Hypermetabolic anal mass with hypermetabolic left inguinal adenopathy, compatible with anorectal carcinoma. 2. Hypermetabolic right breast nodule with recent biopsies on 06/18/2024. Please correlate with pathology report.   07/31/2024 - 07/31/2024 Chemotherapy   Patient is on Treatment Plan : ANUS Mitomycin  D1,28 + 5FU D1-4, 28-31 q32d     08/23/2024 Genetic Testing   Negative genetic testing. No pathogenic variants identified on the [facility] CancerNext-Expanded+RNA Panel. The report date is 08/23/2024.  The CancerNext-Expanded gene panel offered by [facility] and includes sequencing, rearrangement, and RNA analysis for the following  77 genes: AIP, ALK, APC, ATM, AXIN2, BAP1, BARD1, BMPR1A, BRCA1, BRCA2, BRIP1, CDC73, CDH1, CDK4, CDKN1B, CDKN2A, CEBPA, CHEK2, CTNNA1, DDX41, DICER1, ETV6, FH, FLCN, GATA2, LZTR1, MAX, MBD4, MEN1, MET, MLH1, MSH2, MSH3, MSH6, MUTYH, NF1, NF2, NTHL1, PALB2, PHOX2B, PMS2, POT1, PRKAR1A, PTCH1, PTEN, RAD51C, RAD51D, RB1, RET, RPS20, RUNX1, SDHA, SDHAF2, SDHB, SDHC, SDHD, SMAD4, SMARCA4, SMARCB1, SMARCE1, STK11, SUFU, TMEM127, TP53, TSC1, TSC2, VHL, and WT1 (sequencing and deletion/duplication); EGFR, HOXB13, KIT, MITF, PDGFRA, POLD1, and POLE (sequencing only); EPCAM and GREM1 (deletion/duplication only).    08/29/2024 - 08/29/2024 Chemotherapy   Patient is on Treatment Plan : ANUS Mitomycin  D1,29 + Capecitabine  + XRT     Invasive carcinoma of breast (HCC)  06/12/2024 Imaging   CT chest abdomen pelvis with contrast showed Slight thickening in the area of the anal canal. Please correlate for history of neoplasm. There is a pathologic enlarged left inguinal node. There is a borderline right-sided node.   No additional areas of abnormal nodal enlargement or other aggressive appearing mass lesion at this time.   Fatty liver infiltration with known segment 7 hepatic hemangioma.   Of note there is a right-sided upper breast mass which was not clearly seen on prior CT scan. Please correlate for any prior study or if needed diagnostic mammographic evaluation and possible ultrasound when appropriate.   06/17/2024 Mammogram   1.  There is a suspicious 17 mm mass at the site of CT and mammographic concern in the RIGHT breast. It demonstrates associated pleomorphic calcifications. Recommend ultrasound-guided biopsy for definitive characterization. 2. There is incidental sonographic note of a 7 mm mass in the RIGHT breast at 12 o'clock and a 6 mm non mass area at 9 o'clock. Recommend ultrasound-guided biopsy of these 2 areas for definitive characterization given suspicious appearance of the dominant  mass. 3. No suspicious RIGHT axillary adenopathy. 4. No mammographic evidence of malignancy in the LEFT breast.   06/18/2024 Cancer Staging   Staging form: Breast, AJCC 8th Edition - Clinical stage from 06/18/2024: Stage IB (cT1b, cN0, cM0, G3, ER-, PR-, HER2-) - Signed by Babara Call, MD on 06/26/2024 Stage prefix: Initial diagnosis Histologic grading system: 3 grade system   06/18/2024 Initial Diagnosis   Invasive carcinoma of breast [facility])  Patient with right breast biopsy.  Pathology showed 1. Breast, right, needle core biopsy, 12:00 12cmfn (heart clip) :      INVASIVE DUCTAL CARCINOMA      TUBULE FORMATION: SCORE 3      NUCLEAR PLEOMORPHISM: SCORE 3      MITOTIC COUNT: SCORE 2      TOTAL SCORE: 8      OVERALL GRADE: 3      LYMPHOVASCULAR INVASION: NOT IDENTIFIED      CANCER LENGTH: 1.0 CM      CALCIFICATIONS: NOT IDENTIFIED      OTHER FINDINGS: NONE      ER negative, PR negative, HER2 negative (IHC 0) Ki-67 95%.       2. Breast, right, needle core biopsy, 12:00 8cmfn (coil clip) :      INVASIVE DUCTAL CARCINOMA      DUCTAL CARCINOMA IN SITU, SOLID, HIGH NUCLEAR GRADE WITH NECROSIS      TUBULE FORMATION: SCORE 3      NUCLEAR PLEOMORPHISM: SCORE 3      MITOTIC COUNT: SCORE 2      TOTAL SCORE: 8      OVERALL GRADE: 3      LYMPHOVASCULAR INVASION: NOT IDENTIFIED      CANCER LENGTH: 0.3 CM      CALCIFICATIONS: NOT IDENTIFIED      OTHER FINDINGS: NONE            3. Breast, right, needle core biopsy, 9:00 12cmfn (venus) :      BENIGN BREAST TISSUE WITH DENSE STROMAL FIBROSIS.      NEGATIVE FOR ATYPIA OR MALIGNANCY.    07/08/2024 Surgery   Patient went right breast lumpectomy and sentinel lymph node biopsy.  1. 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      - DUCTAL CARCINOMA IN SITU (DCIS).      - SEE CANCER SUMMARY AND NOTE BELOW.      - TWO BIOPSY SITES WITH CORRESPONDING HEART AND COIL CLIPS.      - TWO SAVI  SCOUT TAGS.       2. Lymph node, sentinel, biopsy, Right :      - MICROMETASTATIC CARCINOMA INVOLVES ONE OF FIVE LYMPH NODES (1MI/5).      - SEE CANCER SUMMARY AND NOTE BELOW.       3. Breast, excision, Right medial posterior margin :      - BENIGN BREAST TISSUE.      - NEGATIVE FOR ATYPIA AND MALIGNANCY.      - SEE CANCER SUMMARY  BELOW.   TUMOR Histologic Type: Invasive carcinoma of no special type (ductal) Histologic Grade (Nottingham Histologic Score) Glandular (Acinar)/Tubular Differentiation: 3 Nuclear Pleomorphism: 3 Mitotic Rate: 3 Overall Grade: 3 Tumor Size: Greatest dimension of largest invasive focus: 19 mm Ductal Carcinoma In Situ (DCIS): Present, high-grade with comedonecrosis Lymphatic and/or Vascular Invasion: Present, extensive (LVI in 2 or more blocks)  Treatment Effect in the Breast: No known presurgical therapy MARGINS Margin Status for Invasive Carcinoma: Invasive Carcinoma involves inferior (unifocal) and superior (unifocal) margins and is 0.5 mm to anterior margin Margin Status for DCIS: All margins negative for DCIS Distance from DCIS to closest margin: 0.5 mm Specify closest margin: Inferior REGIONAL LYMPH NODES Regional Lymph Node Status: Tumor present in regional lymph node(s) Number of Lymph Nodes with Macrometastases (greater than 2 mm): 0 Number of Lymph Nodes with Micrometastases (greater than 0.2 mm to 2 mm and/or greater than 200 cells): 1 Number of Lymph Nodes with Isolated Tumor Cells (0.2 mm or less OR 200 cells or less): 0 Size of Largest Metastatic Deposit: 1.9 mm Extranodal Extension: Not identified Total Number of Lymph Nodes Examined (sentinel and non-sentinel): 5 Number of Sentinel Nodes Examined: 5 DISTANT METASTASIS Distant Site(s) Involved, if applicable: Not applicable PATHOLOGIC STAGE CLASSIFICATION (pTNM, AJCC 8th Edition): Modified Classification: Not applicable pT Category: pT1c T Suffix: (m) multiple primary synchronous  tumors in a single organ pN Category: pN11mi N Suffix: (sn) pM Category: Not applicable SPECIAL STUDIES Breast Biomarker Testing Performed on Previous Biopsy: [id number] (heart) Estrogen Receptor: Negative (0%) Progesterone Receptor: Negative (0%) HER2 IHC: Negative (0) Proliferation Marker Ki67: 95%   07/22/2024 Cancer Staging   Staging form: Breast, AJCC 8th Edition - Pathologic stage from 07/22/2024: Stage IB (pT1c, pN45mi(sn), cM0, G3, ER-, PR-, HER2-) - Signed by Babara Call, MD on 07/22/2024 Stage prefix: Initial diagnosis Method of lymph node assessment: Sentinel lymph node biopsy Multigene prognostic tests performed: None Histologic grading system: 3 grade system   07/25/2024 Surgery   Patient reports additional excision for positive margin.  1. Breast, excision, superior margin of right breast - RESIDUAL INVASIVE DUCTAL CARCINOMA, COMPLETELY EXCISED. - CARCINOMA PRESENT 2 MM FROM THE POSTERIOR MARGIN AND 4 MM FROM THE ANTERIOR AND SUPERIOR MARGINS. 2. Breast, excision, inferior margin of right breast - RESIDUAL INVASIVE DUCTAL CARCINOMA, COMPLETELY EXCISED. - CARCINOMA PRESENT 1 MM FROM THE NEW INFERIOR MARGIN.   08/23/2024 Genetic Testing   Negative genetic testing. No pathogenic variants identified on the [facility] CancerNext-Expanded+RNA Panel. The report date is 08/23/2024.  The CancerNext-Expanded gene panel offered by [facility] and includes sequencing, rearrangement, and RNA analysis for the following 77 genes: AIP, ALK, APC, ATM, AXIN2, BAP1, BARD1, BMPR1A, BRCA1, BRCA2, BRIP1, CDC73, CDH1, CDK4, CDKN1B, CDKN2A, CEBPA, CHEK2, CTNNA1, DDX41, DICER1, ETV6, FH, FLCN, GATA2, LZTR1, MAX, MBD4, MEN1, MET, MLH1, MSH2, MSH3, MSH6, MUTYH, NF1, NF2, NTHL1, PALB2, PHOX2B, PMS2, POT1, PRKAR1A, PTCH1, PTEN, RAD51C, RAD51D, RB1, RET, RPS20, RUNX1, SDHA, SDHAF2, SDHB, SDHC, SDHD, SMAD4, SMARCA4, SMARCB1, SMARCE1, STK11, SUFU, TMEM127, TP53, TSC1, TSC2, VHL, and WT1 (sequencing and  deletion/duplication); EGFR, HOXB13, KIT, MITF, PDGFRA, POLD1, and POLE (sequencing only); EPCAM and GREM1 (deletion/duplication only).    10/23/2024 -  Chemotherapy   Patient is on Treatment Plan : BREAST DOSE DENSE AC q14d / PACLitaxel q7d      Discussed the use of AI scribe software for clinical note transcription with the patient, who gave verbal consent to proceed.   Patient has a history of VIN 3 status post vulvectomy  07/11/2024,she was evaluated by cardiology with palpitations and atypical chest pain. Chest pain deemed atypical and she went on to have a normal echo at 07/19/2024.   07/23/2024, patient was seen by symptom management for right neck pain.  She was seen by Tinnie Dawn.  Right upper extremity ultrasound was obtained which showed acute occlusive DVT involving the right internal jugular and subclavian veins.  Patient was started on Lovenox , she was supposed to be started on 1 mg/kg twice daily however was started off 40 mg twice daily which is a subtherapeutic dosage. She was asked to hold off the evening dose prior to her breast re-excision.  07/29/2024, patient presented emergency room due to shortness of breath and chest pain.  CTA angiogram showed subsegmental PE medially in the left lower lobe.  Shortness of breath was felt to be multifactorial secondary to asthma/bronchitis, pulmonary embolism.  Symptoms are better after supportive care and she was discharged on Lovenox  80 mg subcutaneously twice daily.  08/31/2024 -09/06/2024 hospitalized due to sepsis due to multifocal pneumonia, UTI. Respiratory viral panel positive for metapneumovirus  Patient resumed on radiation on 09/16/24.  Patient took Xeloda  twice daily and continued radiation till 09/19/2024.  She was not able to continue radiation on 09/20/2024 due to severe diarrhea.  11/7 radiation was not given due to patient's symptoms Final radiation was done on 09/25/2024. Last dose of Xeloda  was omitted.     INTERVAL  HISTORY Catelin Manthe Mcomber is a 48 y.o. female who has above history reviewed by me today presents for follow up visit prior to chemo  Discussed the use of AI scribe software for clinical note transcription with the patient, who gave verbal consent to proceed.    She denies fever and chills but experiences frequent episodes of diaphoresis and hot flashes, approximately five to six times daily. She experiences sudden and bothersome episodes of diaphoresis and hot flashes, and her menstrual periods were irregular prior to treatment.  She endorses persistent cough, which she feels is worsening, along with increased nasal congestion and post-nasal drip. She denies sore throat and is unable to expectorate sputum. She reports increased fatigue. She denies vomiting but continues to experience nausea and anorexia, forcing herself to eat at least once daily. She has been approved for a nutritional supplement but has not yet received it. She uses Phenergan  for nausea and lorazepam  at night.  She attributes some sleep difficulty to adjusting to a new living environment at her aunt's house. She does not drink coffee but consumes soda regularly, acknowledging high caffeine  and sugar intake and is attempting to reduce this.  She reports dysuria,  She denies abnormal bleeding or bruising.   MEDICAL HISTORY:  Past Medical History:  Diagnosis Date   Anemia    Anginal pain    Anxiety    Arthritis    Asthma    Cancer (HCC)    rectal cancer, breast cancer 2025   Chronic pain syndrome 05/21/2023   DDD (degenerative disc disease), cervical 05/22/2023   Deep venous thrombosis (HCC)    Depression    Dyspnea    Fibromyalgia    GERD (gastroesophageal reflux disease)    Headache    Heart murmur    History of hiatal hernia    History of kidney stones    Lupus    Malignant neoplasm of upper-outer quadrant of right female breast, unspecified estrogen receptor status (HCC) 2025   Pneumonia    PTSD (post-traumatic  stress disorder)    Skin  candidiasis 05/29/2024   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    SURGICAL HISTORY: Past Surgical History:  Procedure Laterality Date   BREAST BIOPSY Right 06/18/2024   US  RT BREAST BX W LOC DEV 1ST LESION IMG BX SPEC US  GUIDE 06/18/2024 [facility]   BREAST BIOPSY Right 06/18/2024   US  RT BREAST BX W LOC DEV EA ADD LESION IMG BX SPEC US  GUIDE 06/18/2024 [facility]   BREAST BIOPSY Right 06/18/2024   US  RT BREAST BX W LOC DEV EA ADD LESION IMG BX SPEC US  GUIDE 06/18/2024 [facility]   BREAST BIOPSY Right 07/05/2024   US  RT BREAST SAVI/RF TAG 1ST LESION US  GUIDE 07/05/2024 [facility]   BREAST BIOPSY Right 07/05/2024   US  RT BREAST SAVI/RF TAG EA ADD'L LESION US  GUIDE 07/05/2024 [facility]   BREAST LUMPECTOMY Right 07/25/2024   Procedure: BREAST LUMPECTOMY;  Surgeon: Tye Millet, DO;  Location: [facility];  Service: General;  Laterality: Right;   BREAST LUMPECTOMY WITH RADIO FREQUENCY LOCALIZER Right 07/08/2024   Procedure: BREAST LUMPECTOMY WITH RADIO FREQUENCY LOCALIZER;  Surgeon: Tye Millet, DO;  Location: [facility];  Service: General;  Laterality: Right;   COLONOSCOPY WITH PROPOFOL  N/A 02/17/2023   Procedure: COLONOSCOPY WITH PROPOFOL ;  Surgeon: Maryruth Ole DASEN, MD;  Location: [facility];  Service: Endoscopy;  Laterality: N/A;   ESOPHAGOGASTRODUODENOSCOPY (EGD) WITH PROPOFOL  N/A 02/17/2023   Procedure: ESOPHAGOGASTRODUODENOSCOPY (EGD) WITH PROPOFOL ;  Surgeon: Maryruth Ole DASEN, MD;  Location: [facility];  Service: Endoscopy;  Laterality: N/A;   EXTRACORPOREAL SHOCK WAVE LITHOTRIPSY     IR CV LINE INJECTION  08/21/2024   IR REMOVAL TUN ACCESS W/ PORT W/O FL MOD SED  08/28/2024   PICC LINE INSERTION N/A 08/2024   PORTA CATH INSERTION N/A 10/14/2024   Procedure: PORTA CATH INSERTION;  Surgeon: Marea Selinda RAMAN, MD;  Location: [facility];  Service: Cardiovascular;  Laterality: N/A;   PORTA CATH REMOVAL  N/A 10/14/2024   Procedure: PORTA CATH REMOVAL;  Surgeon: Marea Selinda RAMAN, MD;  Location: [facility];  Service: Cardiovascular;  Laterality: N/A;   PORTACATH PLACEMENT Right 07/08/2024   Procedure: INSERTION, TUNNELED CENTRAL VENOUS DEVICE, WITH PORT;  Surgeon: Tye Millet, DO;  Location: [facility];  Service: General;  Laterality: Right;   RECTAL EXAM UNDER ANESTHESIA N/A 05/16/2024   Procedure: EXAM UNDER ANESTHESIA, RECTUM, INCISIONAL BIOPSY OF ANAL MASS;  Surgeon: Tye Millet, DO;  Location: [facility];  Service: General;  Laterality: N/A;   SENTINEL NODE BIOPSY Right 07/08/2024   Procedure: BIOPSY, LYMPH NODE, SENTINEL;  Surgeon: Tye Millet, DO;  Location: [facility];  Service: General;  Laterality: Right;   TONSILLECTOMY     TUBAL LIGATION     VULVECTOMY N/A 07/29/2020   Procedure: WIDE EXCISION VULVECTOMY;  Surgeon: Mancil Barter, MD;  Location: [facility];  Service: Gynecology;  Laterality: N/A;    SOCIAL HISTORY: Social History   Socioeconomic History   Marital status: Divorced    Spouse name: Not on file   Number of children: 1   Years of education: Not on file   Highest education level: Not on file  Occupational History   Not on file  Tobacco Use   Smoking status: Former    Types: Cigarettes   Smokeless tobacco: Never  Vaping Use   Vaping status: Never Used  Substance and Sexual Activity   Alcohol use: Not Currently   Drug use: No   Sexual activity: Yes  Other Topics Concern   Not on file  Social History Narrative   Not on file   Social Drivers of Health   Tobacco Use: Medium Risk (10/30/2024)   Patient History    Smoking Tobacco Use: Former    Smokeless Tobacco Use: Never    Passive Exposure: Not on file  Financial Resource Strain: Medium Risk (05/15/2024)   Received from [facility]   Overall Financial Resource Strain (CARDIA)    Difficulty of Paying Living Expenses: Somewhat hard  Food Insecurity: No Food Insecurity (09/07/2024)    Epic    Worried About Programme Researcher, Broadcasting/film/video in the Last Year: Never true    Ran Out of Food in the Last Year: Never true  Recent Concern: Food Insecurity - Food Insecurity Present (08/22/2024)   Epic    Worried About Programme Researcher, Broadcasting/film/video in the Last Year: Sometimes true    [employer] of Food in the Last Year: Sometimes true  Transportation Needs: No Transportation Needs (09/07/2024)   Epic    Lack of Transportation (Medical): No    Lack of Transportation (Non-Medical): No  Physical Activity: Not on file  Stress: Not on file  Social Connections: Not on file  Intimate Partner Violence: Not At Risk (09/07/2024)   Epic    Fear of Current or Ex-Partner: No    Emotionally Abused: No    Physically Abused: No    Sexually Abused: No  Depression (PHQ2-9): Low Risk (10/23/2024)   Depression (PHQ2-9)    PHQ-2 Score: 0  Alcohol Screen: Low Risk (10/16/2024)   Alcohol Screen    Last Alcohol Screening Score (AUDIT): 0  Housing: Low Risk (10/16/2024)   Epic    Unable to Pay for Housing in the Last Year: No    Number of Times Moved in the Last Year: 1    Homeless in the Last Year: No  Utilities: Not At Risk (09/07/2024)   Epic    Threatened with loss of utilities: No  Health Literacy: Not on file    FAMILY HISTORY: Family History  Problem Relation Age of Onset   Diabetes Mother    Congestive Heart Failure Mother    Colon cancer Father 22   Lupus Maternal Grandmother    Cancer Maternal Grandfather        either prostate or colon   Colon cancer Paternal Grandfather    Lung cancer Paternal Grandfather     ALLERGIES:  is allergic to prednisone and silicone.  MEDICATIONS:  Current Outpatient Medications  Medication Sig Dispense Refill   acetaminophen  (TYLENOL ) 500 MG tablet Take 500 mg by mouth every 6 (six) hours as needed.     albuterol  (VENTOLIN  HFA) 108 (90 Base) MCG/ACT inhaler Inhale 2 puffs into the lungs every 4 (four) hours as needed for wheezing or shortness of breath. 8.5 g 2    ascorbic acid (VITAMIN C) 500 MG tablet Take 500 mg by mouth daily.     azelastine  (ASTELIN ) 0.1 % nasal spray Place 1 spray into both nostrils 2 (two) times daily. Use in each nostril as directed 30 mL 0   benzonatate  (TESSALON ) 200 MG capsule Take 1 capsule (200 mg total) by mouth 3 (three) times daily as needed for cough. 30 capsule 1   BIOTIN PO Take by mouth daily.     budesonide  (PULMICORT ) 0.25 MG/2ML nebulizer solution Take 2 mLs (0.25 mg total) by nebulization 2 (two) times daily. 60 mL 12   busPIRone  (BUSPAR ) 5 MG tablet Take 5 mg by mouth 2 (two) times  daily.     cholecalciferol (VITAMIN D3) 25 MCG (1000 UNIT) tablet Take 1,000 Units by mouth daily.     diphenoxylate -atropine  (LOMOTIL ) 2.5-0.025 MG tablet Take 1 tablet by mouth 4 (four) times daily. 60 tablet 0   DULoxetine  (CYMBALTA ) 30 MG capsule Take 30 mg by mouth at bedtime.     enoxaparin  (LOVENOX ) 80 MG/0.8ML injection Inject 0.8 mLs (80 mg total) into the skin every 12 (twelve) hours. 48 mL 2   gabapentin  (NEURONTIN ) 300 MG capsule Take 600 mg by mouth 2 (two) times daily.     ibuprofen  (ADVIL ) 200 MG tablet Take 200 mg by mouth every 6 (six) hours as needed.     ipratropium-albuterol  (DUONEB) 0.5-2.5 (3) MG/3ML SOLN Take 3 mLs by nebulization 4 (four) times daily. 360 mL 3   lactose free nutrition (BOOST PLUS) LIQD Take 237 mLs by mouth 2 (two) times daily between meals. 237 mL 11   lidocaine -prilocaine  (EMLA ) cream Apply 1 Application topically as needed. 30 g 3   linaclotide (LINZESS) 145 MCG CAPS capsule Take 145 mcg by mouth daily before breakfast.     montelukast  (SINGULAIR ) 10 MG tablet Take 1 tablet (10 mg total) by mouth See admin instructions. Take 1 tablet on the day prior to chemotherapy and take 1 tablet daily for 2 days after chemotherapy. 30 tablet 0   Multiple Vitamin (MULTIVITAMIN WITH MINERALS) TABS tablet Take 1 tablet by mouth daily.     naloxone  (NARCAN ) nasal spray 4 mg/0.1 mL SPRAY 1 SPRAY INTO ONE NOSTRIL  AS DIRECTED FOR OPIOID OVERDOSE (TURN PERSON ON SIDE AFTER DOSE. IF NO RESPONSE IN 2-3 MINUTES OR PERSON RESPONDS BUT RELAPSES, REPEAT USING A NEW SPRAY DEVICE AND SPRAY INTO THE OTHER NOSTRIL. CALL 911 AFTER USE.) * EMERGENCY USE ONLY * 1 each 0   OLANZapine  (ZYPREXA ) 5 MG tablet Take 1 tablet (5 mg total) by mouth at bedtime. 30 tablet 3   oxyCODONE  (OXY IR/ROXICODONE ) 5 MG immediate release tablet Take 1 tablet (5 mg total) by mouth every 6 (six) hours as needed for severe pain (pain score 7-10) or breakthrough pain. 15 tablet 0   potassium chloride  SA (KLOR-CON  M) 20 MEQ tablet Take 1 tablet (20 mEq total) by mouth 2 (two) times daily. 60 tablet 0   prochlorperazine  (COMPAZINE ) 10 MG tablet Take 1 tablet (10 mg total) by mouth every 6 (six) hours as needed for nausea or vomiting. 30 tablet 1   promethazine  (PHENERGAN ) 25 MG tablet Take 1 tablet (25 mg total) by mouth every 8 (eight) hours as needed for nausea or vomiting. 60 tablet 0   traZODone  (DESYREL ) 50 MG tablet Take 50 mg by mouth at bedtime.     dexamethasone  (DECADRON ) 4 MG tablet Take 2 tablets (8 mg total) by mouth daily for 2 days. Start the day after doxorubicin /cyclophosphamide  chemotherapy. Take with food. (Patient not taking: Reported on 10/30/2024) 30 tablet 1   esomeprazole  (NEXIUM ) 40 MG capsule Take 1 capsule (40 mg total) by mouth 2 (two) times daily before a meal. (Patient not taking: Reported on 10/30/2024) 60 capsule 2   LORazepam  (ATIVAN ) 0.5 MG tablet Take 1 tablet (0.5 mg total) by mouth every 12 (twelve) hours as needed for anxiety (nausea). 30 tablet 0   nystatin  (MYCOSTATIN ) 100000 UNIT/ML suspension Take 5 mLs (500,000 Units total) by mouth 4 (four) times daily. Swish and swallow (Patient not taking: Reported on 10/30/2024) 473 mL 1   Turmeric (QC TUMERIC COMPLEX PO) Take by mouth daily. (  Patient not taking: Reported on 10/30/2024)     No current facility-administered medications for this visit.    Review of Systems   Constitutional:  Positive for fatigue. Negative for chills and fever.  HENT:   Negative for hearing loss and voice change.        Nasal congestion  Eyes:  Negative for eye problems.  Respiratory:  Negative for chest tightness, cough and shortness of breath.   Cardiovascular:  Negative for chest pain.  Gastrointestinal:  Positive for nausea. Negative for abdominal distention, abdominal pain, blood in stool and diarrhea.  Endocrine: Negative for hot flashes.  Genitourinary:  Positive for dysuria. Negative for difficulty urinating and frequency.   Musculoskeletal:  Negative for arthralgias.  Skin:  Negative for itching and rash.  Neurological:  Negative for extremity weakness.  Hematological:  Negative for adenopathy.  Psychiatric/Behavioral:  Negative for confusion.      PHYSICAL EXAMINATION: ECOG PERFORMANCE STATUS: 0 - Asymptomatic  Vitals:   10/30/24 0854  BP: 95/79  Pulse: (!) 105  Resp: 18  Temp: (!) 97 F (36.1 C)  SpO2: 100%   Filed Weights   10/30/24 0854  Weight: 183 lb 6.4 oz (83.2 kg)    Physical Exam Constitutional:      General: She is not in acute distress.    Appearance: She is not diaphoretic.  HENT:     Head: Normocephalic and atraumatic.     Mouth/Throat:     Pharynx: No oropharyngeal exudate.  Eyes:     General: No scleral icterus. Cardiovascular:     Rate and Rhythm: Normal rate and regular rhythm.     Heart sounds: No murmur heard. Pulmonary:     Effort: Pulmonary effort is normal. No respiratory distress.     Breath sounds: No wheezing.  Abdominal:     General: There is no distension.     Palpations: Abdomen is soft.     Tenderness: There is no abdominal tenderness.  Musculoskeletal:        General: Normal range of motion.     Cervical back: Normal range of motion and neck supple.  Skin:    General: Skin is warm and dry.     Findings: No erythema.     Comments: Left chest wall medi port, mild erythematous changes at the incision site.    Neurological:     Mental Status: She is alert and oriented to person, place, and time. Mental status is at baseline.     Motor: No abnormal muscle tone.  Psychiatric:        Mood and Affect: Affect normal.         LABORATORY DATA:  I have reviewed the data as listed    Latest Ref Rng & Units 10/30/2024    8:37 AM 10/23/2024    8:44 AM 09/23/2024    2:04 PM  CBC  WBC 4.0 - 10.5 K/uL 1.1  5.2  5.0   Hemoglobin 12.0 - 15.0 g/dL 89.6  87.7  88.9   Hematocrit 36.0 - 46.0 % 32.3  38.0  33.8   Platelets 150 - 400 K/uL 110  346  322       Latest Ref Rng & Units 10/30/2024    8:37 AM 10/23/2024    8:44 AM 09/30/2024    8:35 AM  CMP  Glucose 70 - 99 mg/dL 859  874  859   BUN 6 - 20 mg/dL 11  6  8    Creatinine 0.44 - 1.00  mg/dL 9.29  9.10  9.17   Sodium 135 - 145 mmol/L 137  139  137   Potassium 3.5 - 5.1 mmol/L 4.2  4.1  3.7   Chloride 98 - 111 mmol/L 103  104  106   CO2 22 - 32 mmol/L 24  23  21    Calcium 8.9 - 10.3 mg/dL 9.1  9.5  8.8   Total Protein 6.5 - 8.1 g/dL 6.0  6.5  6.2   Total Bilirubin 0.0 - 1.2 mg/dL 0.6  0.6  1.0   Alkaline Phos 38 - 126 U/L 92  90  82   AST 15 - 41 U/L 37  30  85   ALT 0 - 44 U/L 34  21  85      RADIOGRAPHIC STUDIES: I have personally reviewed the radiological images as listed and agreed with the findings in the report. PERIPHERAL VASCULAR CATHETERIZATION Result Date: 10/14/2024 See surgical note for result.  ECHOCARDIOGRAM COMPLETE Result Date: 10/09/2024    ECHOCARDIOGRAM REPORT   Patient Name:   Monica Gonzalez Date of Exam: 10/09/2024 Medical Rec #:  [id number]     Height:       59.0 in Accession #:    [id number]    Weight:       186.0 lb Date of Birth:  [birth year]      BSA:          1.788 m Patient Age:    48 years      BP:           114/71 mmHg Patient Gender: F             HR:           104 bpm. Exam Location:  [facility] Procedure: 2D Echo, Cardiac Doppler, Color Doppler, 3D Echo and Strain Analysis            (Both Spectral and Color Flow  Doppler were utilized during            procedure). Indications:     Chemo Z09  History:         Patient has no prior history of Echocardiogram examinations.                  Signs/Symptoms:Dyspnea and Murmur. Angina.  Sonographer:     Christopher Furnace Referring Phys:  [id number] Pankaj Haack Diagnosing Phys: Evalene Lunger MD  Sonographer Comments: Global longitudinal strain was attempted. IMPRESSIONS  1. Left ventricular ejection fraction, by estimation, is 60 to 65%. The left ventricle has normal function. The left ventricle has no regional wall motion abnormalities. Left ventricular diastolic parameters are consistent with Grade I diastolic dysfunction (impaired relaxation).  2. Right ventricular systolic function is normal. The right ventricular size is normal. There is normal pulmonary artery systolic pressure. The estimated right ventricular systolic pressure is 15.8 mmHg.  3. The mitral valve is normal in structure. No evidence of mitral valve regurgitation. No evidence of mitral stenosis.  4. The aortic valve is normal in structure. Aortic valve regurgitation is not visualized. No aortic stenosis is present.  5. The inferior vena cava is normal in size with greater than 50% respiratory variability, suggesting right atrial pressure of 3 mmHg. FINDINGS  Left Ventricle: Left ventricular ejection fraction, by estimation, is 60 to 65%. The left ventricle has normal function. The left ventricle has no regional wall motion abnormalities. The global longitudinal strain is normal despite suboptimal segment tracking. The left ventricular internal cavity size  was normal in size. There is no left ventricular hypertrophy. Left ventricular diastolic parameters are consistent with Grade I diastolic dysfunction (impaired relaxation). Right Ventricle: The right ventricular size is normal. No increase in right ventricular wall thickness. Right ventricular systolic function is normal. There is normal pulmonary artery systolic pressure. The  tricuspid regurgitant velocity is 1.64 m/s, and  with an assumed right atrial pressure of 5 mmHg, the estimated right ventricular systolic pressure is 15.8 mmHg. Left Atrium: Left atrial size was normal in size. Right Atrium: Right atrial size was normal in size. Pericardium: There is no evidence of pericardial effusion. Mitral Valve: The mitral valve is normal in structure. No evidence of mitral valve regurgitation. No evidence of mitral valve stenosis. MV peak gradient, 3.1 mmHg. The mean mitral valve gradient is 2.0 mmHg. Tricuspid Valve: The tricuspid valve is normal in structure. Tricuspid valve regurgitation is mild . No evidence of tricuspid stenosis. Aortic Valve: The aortic valve is normal in structure. Aortic valve regurgitation is not visualized. No aortic stenosis is present. Aortic valve mean gradient measures 7.0 mmHg. Aortic valve peak gradient measures 12.1 mmHg. Aortic valve area, by VTI measures 2.33 cm. Pulmonic Valve: The pulmonic valve was normal in structure. Pulmonic valve regurgitation is not visualized. No evidence of pulmonic stenosis. Aorta: The aortic root is normal in size and structure. Venous: The inferior vena cava is normal in size with greater than 50% respiratory variability, suggesting right atrial pressure of 3 mmHg. IAS/Shunts: No atrial level shunt detected by color flow Doppler. Additional Comments: 3D was performed not requiring image post processing on an independent workstation and was indeterminate.  LEFT VENTRICLE PLAX 2D LVIDd:         4.00 cm   Diastology LVIDs:         2.90 cm   LV e' medial:    6.09 cm/s LV PW:         1.00 cm   LV E/e' medial:  12.0 LV IVS:        0.90 cm   LV e' lateral:   10.40 cm/s LVOT diam:     2.00 cm   LV E/e' lateral: 7.0 LV SV:         55 LV SV Index:   31 LVOT Area:     3.14 cm LV IVRT:       92 msec  RIGHT VENTRICLE RV Basal diam:  3.70 cm     PULMONARY VEINS RV Mid diam:    2.80 cm     Diastolic Velocity: 20.30 cm/s RV S prime:      14.40 cm/s  S/D Velocity:       1.90 TAPSE (M-mode): 1.6 cm      Systolic Velocity:  39.10 cm/s LEFT ATRIUM           Index        RIGHT ATRIUM           Index LA diam:      2.60 cm 1.45 cm/m   RA Area:     13.60 cm LA Vol (A2C): 30.6 ml 17.11 ml/m  RA Volume:   36.60 ml  20.47 ml/m LA Vol (A4C): 17.2 ml 9.62 ml/m  AORTIC VALVE AV Area (Vmax):    1.97 cm AV Area (Vmean):   1.99 cm AV Area (VTI):     2.33 cm AV Vmax:           174.00 cm/s AV Vmean:  124.500 cm/s AV VTI:            0.236 m AV Peak Grad:      12.1 mmHg AV Mean Grad:      7.0 mmHg LVOT Vmax:         109.00 cm/s LVOT Vmean:        78.800 cm/s LVOT VTI:          0.175 m LVOT/AV VTI ratio: 0.74  AORTA Ao Root diam: 2.60 cm MITRAL VALVE               TRICUSPID VALVE MV Area (PHT): 4.01 cm    TR Peak grad:   10.8 mmHg MV Area VTI:   3.46 cm    TR Vmax:        164.00 cm/s MV Peak grad:  3.1 mmHg MV Mean grad:  2.0 mmHg    SHUNTS MV Vmax:       0.88 m/s    Systemic VTI:  0.18 m MV Vmean:      59.4 cm/s   Systemic Diam: 2.00 cm MV Decel Time: 189 msec MV E velocity: 72.80 cm/s MV A velocity: 85.40 cm/s MV E/A ratio:  0.85 Evalene Lunger MD Electronically signed by Evalene Lunger MD Signature Date/Time: 10/09/2024/2:08:14 PM    Final    VAS US  UPPER EXTREMITY VENOUS DUPLEX Result Date: 10/07/2024 UPPER VENOUS STUDY  Patient Name:  Monica Gonzalez  Date of Exam:   10/03/2024 Medical Rec #: [id number]      Accession #:    [id number] Date of Birth: [birth year]       Patient Gender: F Patient Age:   34 years Exam Location:  [facility] Procedure:      VAS US  UPPER EXTREMITY VENOUS DUPLEX Referring Phys: ORVIN DARING --------------------------------------------------------------------------------  Indications: F/U DVT Rt UE Risk Factors: DVT 07/23/2024: DVT Rt IJV and SCV. Comparison Study: 07/23/2024 Performing Technologist: Leafy Gibes RVS  Examination Guidelines: A complete evaluation includes B-mode imaging, spectral Doppler,  color Doppler, and power Doppler as needed of all accessible portions of each vessel. Bilateral testing is considered an integral part of a complete examination. Limited examinations for reoccurring indications may be performed as noted.  Right Findings: +----------+------------+---------+-----------+----------+-------+ RIGHT     CompressiblePhasicitySpontaneousPropertiesSummary +----------+------------+---------+-----------+----------+-------+ IJV           Full       Yes       Yes                      +----------+------------+---------+-----------+----------+-------+ Subclavian    Full       Yes       Yes                      +----------+------------+---------+-----------+----------+-------+ Axillary      Full       Yes       Yes                      +----------+------------+---------+-----------+----------+-------+ Brachial      Full       Yes       Yes                      +----------+------------+---------+-----------+----------+-------+ Radial        Full       Yes       Yes                      +----------+------------+---------+-----------+----------+-------+  Ulnar         Full       Yes       Yes                      +----------+------------+---------+-----------+----------+-------+ Cephalic      Full       Yes       Yes                      +----------+------------+---------+-----------+----------+-------+ Basilic       Full       Yes       Yes                      +----------+------------+---------+-----------+----------+-------+  Left Findings: +----+------------+---------+-----------+----------+-------+ LEFTCompressiblePhasicitySpontaneousPropertiesSummary +----+------------+---------+-----------+----------+-------+ IJV     Full       Yes       Yes                      +----+------------+---------+-----------+----------+-------+  Summary:  Right: No evidence of deep vein thrombosis in the upper extremity. No evidence of  superficial vein thrombosis in the upper extremity. No evidence of thrombosis in the subclavian. Findings for deep vein thrombosis appear to be resolved from previous study.  *See table(s) above for measurements and observations.  Diagnosing physician: Cordella Shawl MD Electronically signed by Cordella Shawl MD on 10/07/2024 at 10:05:29 AM.    Final

## 2024-10-30 NOTE — Assessment & Plan Note (Signed)
 Obtain UA and urine culture.  UA showed large leukocytes. Culture is pending. I will cover with empiric antibiotics. Cipro  250mg  BID x 5 days.

## 2024-10-30 NOTE — Assessment & Plan Note (Addendum)
 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 mT1c [id number] Initial margin was positive and patient underwent reexcision and achieved a negative margin.  Recommend  adjuvant ddAC followed by weekly Taxol. Baseline Echo LVEF 60-65%.Rationale and side effects were reviewed with patient.  Labs are reviewed and discussed with patient. Proceed with cycle 1 ddAC with D3 GCSF.  She tolerated with some difficulties.  Supportive care. Hydration session today

## 2024-10-31 LAB — URINE CULTURE: Culture: NO GROWTH

## 2024-11-01 ENCOUNTER — Telehealth: Payer: Self-pay | Admitting: Radiation Oncology

## 2024-11-01 NOTE — Telephone Encounter (Signed)
 Called pt to sched CT - left vm for return call - LH

## 2024-11-04 ENCOUNTER — Encounter: Payer: Self-pay | Admitting: Radiation Oncology

## 2024-11-04 ENCOUNTER — Telehealth: Payer: Self-pay | Admitting: Radiation Oncology

## 2024-11-04 NOTE — Telephone Encounter (Signed)
 Called pt to sched CT - pt confirmed date/time/location - pt confirmed about 2 hour early arrival for oral contrast - pt requested appt reminder via mail - LH

## 2024-11-06 ENCOUNTER — Inpatient Hospital Stay

## 2024-11-11 ENCOUNTER — Inpatient Hospital Stay: Admitting: Oncology

## 2024-11-11 ENCOUNTER — Inpatient Hospital Stay

## 2024-11-11 ENCOUNTER — Encounter: Payer: Self-pay | Admitting: Oncology

## 2024-11-11 VITALS — BP 98/81 | HR 89 | Temp 97.5°F | Resp 18 | Wt 177.4 lb

## 2024-11-11 DIAGNOSIS — I2699 Other pulmonary embolism without acute cor pulmonale: Secondary | ICD-10-CM

## 2024-11-11 DIAGNOSIS — D701 Agranulocytosis secondary to cancer chemotherapy: Secondary | ICD-10-CM

## 2024-11-11 DIAGNOSIS — C50919 Malignant neoplasm of unspecified site of unspecified female breast: Secondary | ICD-10-CM | POA: Diagnosis not present

## 2024-11-11 DIAGNOSIS — C210 Malignant neoplasm of anus, unspecified: Secondary | ICD-10-CM

## 2024-11-11 DIAGNOSIS — R110 Nausea: Secondary | ICD-10-CM

## 2024-11-11 DIAGNOSIS — T451X5A Adverse effect of antineoplastic and immunosuppressive drugs, initial encounter: Secondary | ICD-10-CM | POA: Diagnosis not present

## 2024-11-11 DIAGNOSIS — R197 Diarrhea, unspecified: Secondary | ICD-10-CM

## 2024-11-11 LAB — CBC WITH DIFFERENTIAL (CANCER CENTER ONLY)
Abs Immature Granulocytes: 0.2 K/uL — ABNORMAL HIGH (ref 0.00–0.07)
Basophils Absolute: 0.1 K/uL (ref 0.0–0.1)
Basophils Relative: 1 %
Eosinophils Absolute: 0 K/uL (ref 0.0–0.5)
Eosinophils Relative: 0 %
HCT: 37.7 % (ref 36.0–46.0)
Hemoglobin: 12.3 g/dL (ref 12.0–15.0)
Immature Granulocytes: 3 %
Lymphocytes Relative: 11 %
Lymphs Abs: 0.8 K/uL (ref 0.7–4.0)
MCH: 32.5 pg (ref 26.0–34.0)
MCHC: 32.6 g/dL (ref 30.0–36.0)
MCV: 99.5 fL (ref 80.0–100.0)
Monocytes Absolute: 0.9 K/uL (ref 0.1–1.0)
Monocytes Relative: 13 %
Neutro Abs: 5 K/uL (ref 1.7–7.7)
Neutrophils Relative %: 72 %
Platelet Count: 503 K/uL — ABNORMAL HIGH (ref 150–400)
RBC: 3.79 MIL/uL — ABNORMAL LOW (ref 3.87–5.11)
RDW: 15.8 % — ABNORMAL HIGH (ref 11.5–15.5)
WBC Count: 7 K/uL (ref 4.0–10.5)
nRBC: 0 % (ref 0.0–0.2)

## 2024-11-11 LAB — CMP (CANCER CENTER ONLY)
ALT: 26 U/L (ref 0–44)
AST: 35 U/L (ref 15–41)
Albumin: 4.1 g/dL (ref 3.5–5.0)
Alkaline Phosphatase: 83 U/L (ref 38–126)
Anion gap: 15 (ref 5–15)
BUN: <5 mg/dL — ABNORMAL LOW (ref 6–20)
CO2: 23 mmol/L (ref 22–32)
Calcium: 9.6 mg/dL (ref 8.9–10.3)
Chloride: 103 mmol/L (ref 98–111)
Creatinine: 0.96 mg/dL (ref 0.44–1.00)
GFR, Estimated: >60 mL/min
Glucose, Bld: 147 mg/dL — ABNORMAL HIGH (ref 70–99)
Potassium: 3.7 mmol/L (ref 3.5–5.1)
Sodium: 141 mmol/L (ref 135–145)
Total Bilirubin: 0.4 mg/dL (ref 0.0–1.2)
Total Protein: 6.7 g/dL (ref 6.5–8.1)

## 2024-11-11 MED ORDER — DIPHENOXYLATE-ATROPINE 2.5-0.025 MG PO TABS: 1.0000 | ORAL_TABLET | Freq: Four times a day (QID) | ORAL | 0 refills | Status: AC

## 2024-11-11 MED ORDER — SODIUM CHLORIDE 0.9 % IV SOLN
Freq: Once | INTRAVENOUS | Status: AC
  Filled 2024-11-11: qty 250

## 2024-11-11 MED ORDER — TRAZODONE HCL 50 MG PO TABS: 50.0000 mg | ORAL_TABLET | Freq: Every day | ORAL | 2 refills | Status: DC

## 2024-11-11 NOTE — Assessment & Plan Note (Signed)
 Encourage oral hydration.  Imodium  as needed.  Recent antibiotics use check stool panel and C. difficile.

## 2024-11-11 NOTE — Progress Notes (Signed)
 Pt here for follow up. Reports that right before coming to appt, she had an episode of lightheadedness and dizziness. She felt like she was going to pass out. Pt also requesting refill on Trazodone , Lomitil and Olanzapine . She has been having diarrhea for the past 4 days and Lomotil  has not been helping much.

## 2024-11-11 NOTE — Assessment & Plan Note (Signed)
 Continue home antiemetics phernegan and compazine  PRN.  Added lorazepam  0.5 every 12 hours as needed anxiety and nausea.

## 2024-11-11 NOTE — Progress Notes (Signed)
 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 mT1c pN73mi Initial margin was positive and patient underwent reexcision and achieved a negative margin.  Recommend  adjuvant ddAC followed by weekly Taxol. Baseline Echo LVEF 60-65%.Rationale and side effects were reviewed with patient.  Labs are reviewed and discussed with patient. Motorcycle trip ddAC with D3 GCSF.  Due to diarrhea. Supportive care. Hydration session today  Acute subsegmental pulmonary embolism (HCC) Continue Lovenox  1 mg/kg twice daily.  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  Per IR, right inguinal lymph node is not enlarged.  Previously on concurrent chemotherapy 5-FU and mitomycin  C with Radiation. S/p D1 5-FU and Mitomycin -C. D29 5-FU infusion was switched to concurrent Xeloda  due to lack of medi port.- she has picc line. Received D29 Mitomycin  C. Chemo was interrupted due to multi focal pneumonia.  S/p concurrent Xeloda  with Radiation [last RT 09/24/2024 .   Future plan - DRE 8-12 weeks after treatment - Jan 2026. I will re image after she finishes adjuvant breast chemotherapy  Chemotherapy induced neutropenia She receives Day 3 long acting GCSF Neutropenia precaution.   Chemotherapy-induced nausea Continue home antiemetics phernegan and compazine  PRN.  Added lorazepam  0.5 every 12 hours as needed anxiety and nausea.     Diarrhea Encourage oral hydration.  Imodium  as needed.  Recent antibiotics use check stool panel and C. difficile.      Orders Placed This Encounter  Procedures   C difficile quick  screen w PCR reflex    Standing Status:   Future    Expected Date:   11/11/2024    Expiration Date:   02/09/2025   Gastrointestinal Panel by PCR , Stool    Standing Status:   Future    Expected Date:   11/11/2024    Expiration Date:   02/09/2025   CBC with Differential (Cancer Center Only)    Standing Status:   Future    Expected Date:   11/20/2024    Expiration Date:   11/20/2025   CMP (Cancer Center only)    Standing Status:   Future    Expected Date:   11/20/2024    Expiration Date:   11/20/2025   Follow-up 1 week.  All questions were answered. The patient knows to call the clinic with any problems, questions or concerns.  [name], MD, PhD [facility] Hematology Oncology 11/11/2024    HISTORY OF PRESENTING ILLNESS:  Monica Gonzalez 48 y.o. 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 Summary of oncologic history is as follows: Oncology History  Anal squamous cell carcinoma (HCC)  05/29/2024 Initial Diagnosis   Anal squamous cell carcinoma   Patient has noticed a hard mass at the anal area which she initially thought was a hemorrhoid.  The mass has been present for more than a month and appears to be enlarging. Patient was referred to establish care with surgeon Dr. Tye  05/16/2024, incisional biopsy of anal mass showed 1. Anus, biopsy, mass :       -  INVASIVE MODERATELY DIFFERENTIATED SQUAMOUS CELL CARCINOMA  06/19/2024, left inguinal adenopathy biopsy showed Metastatic moderately differentiated squamous cell carcinoma.  '    05/29/2024 Cancer Staging   Staging form: Anus, AJCC V9 - Clinical stage from 05/29/2024: Stage IIIA (cT3, cN1a, cM0) - Signed by Babara Call, MD on 06/26/2024 Stage prefix: Initial diagnosis   06/05/2024 Imaging   CT chest abdomen pelvis with contrast  Slight thickening in the area  of the anal canal. Please correlate for history of neoplasm. There is a pathologic enlarged left inguinal node. There is a borderline right-sided node.   No additional areas of abnormal nodal enlargement or other aggressive appearing mass lesion at this time.   Fatty liver infiltration with known segment 7 hepatic hemangioma.   Of note there is a right-sided upper breast mass which was not clearly seen on prior CT scan. Please correlate for any prior study or if needed diagnostic mammographic evaluation and possible ultrasound when appropriate.     06/12/2024 Imaging   CT chest abdomen pelvis with contrast showed Slight thickening in the area of the anal canal. Please correlate for history of neoplasm. There is a pathologic enlarged left inguinal node. There is a borderline right-sided node.   No additional areas of abnormal nodal enlargement or other aggressive appearing mass lesion at this time.   Fatty liver infiltration with known segment 7 hepatic hemangioma.   Of note there is a right-sided upper breast mass which was not clearly seen on prior CT scan. Please correlate for any prior study or if needed diagnostic mammographic evaluation and possible ultrasound when appropriate.   06/19/2024 Procedure   1. Lymph node, biopsy, left inguinal adenopathy :       -  METASTATIC MODERATELY DIFFERENTIATED SQUAMOUS CELL CARCINOMA    07/01/2024 Imaging   PET scan showed 1. Hypermetabolic anal mass with hypermetabolic left inguinal adenopathy, compatible with anorectal carcinoma. 2. Hypermetabolic right breast nodule with recent biopsies on 06/18/2024. Please correlate with pathology report.   07/31/2024 - 07/31/2024 Chemotherapy   Patient is on Treatment Plan : ANUS Mitomycin  D1,28 + 5FU D1-4, 28-31 q32d     08/23/2024 Genetic Testing   Negative genetic testing. No pathogenic variants identified on the [facility] CancerNext-Expanded+RNA Panel. The report date is 08/23/2024.  The  CancerNext-Expanded gene panel offered by [facility] and includes sequencing, rearrangement, and RNA analysis for the following 77 genes: AIP, ALK, APC, ATM, AXIN2, BAP1, BARD1, BMPR1A, BRCA1, BRCA2, BRIP1, CDC73, CDH1, CDK4, CDKN1B, CDKN2A, CEBPA, CHEK2, CTNNA1, DDX41, DICER1, ETV6, FH, FLCN, GATA2, LZTR1, MAX, MBD4, MEN1, MET, MLH1, MSH2, MSH3, MSH6, MUTYH, NF1, NF2, NTHL1, PALB2, PHOX2B, PMS2, POT1, PRKAR1A, PTCH1, PTEN, RAD51C, RAD51D, RB1, RET, RPS20, RUNX1, SDHA, SDHAF2, SDHB, SDHC, SDHD, SMAD4, SMARCA4, SMARCB1, SMARCE1, STK11, SUFU, TMEM127, TP53, TSC1, TSC2, VHL, and WT1 (sequencing and deletion/duplication); EGFR, HOXB13, KIT, MITF, PDGFRA, POLD1, and POLE (sequencing only); EPCAM and GREM1 (deletion/duplication only).    08/29/2024 - 08/29/2024 Chemotherapy   Patient is on Treatment Plan : ANUS Mitomycin  D1,29 + Capecitabine  + XRT     Invasive carcinoma of breast (HCC)  06/12/2024 Imaging   CT chest abdomen pelvis with contrast showed Slight thickening in the area of the anal canal. Please correlate for history of neoplasm. There is a pathologic enlarged left inguinal node. There is a borderline right-sided node.   No additional areas of abnormal nodal enlargement or other aggressive appearing mass lesion at this time.   Fatty liver infiltration with known segment 7 hepatic hemangioma.  Of note there is a right-sided upper breast mass which was not clearly seen on prior CT scan. Please correlate for any prior study or if needed diagnostic mammographic evaluation and possible ultrasound when appropriate.   06/17/2024 Mammogram   1. There is a suspicious 17 mm mass at the site of CT and mammographic concern in the RIGHT breast. It demonstrates associated pleomorphic calcifications. Recommend ultrasound-guided biopsy for definitive characterization. 2. There is incidental sonographic note of a 7 mm mass in the RIGHT breast at 12 o'clock and a 6 mm non mass area at 9  o'clock. Recommend ultrasound-guided biopsy of these 2 areas for definitive characterization given suspicious appearance of the dominant mass. 3. No suspicious RIGHT axillary adenopathy. 4. No mammographic evidence of malignancy in the LEFT breast.   06/18/2024 Cancer Staging   Staging form: Breast, AJCC 8th Edition - Clinical stage from 06/18/2024: Stage IB (cT1b, cN0, cM0, G3, ER-, PR-, HER2-) - Signed by Babara Call, MD on 06/26/2024 Stage prefix: Initial diagnosis Histologic grading system: 3 grade system   06/18/2024 Initial Diagnosis   Invasive carcinoma of breast [facility])  Patient with right breast biopsy.  Pathology showed 1. Breast, right, needle core biopsy, 12:00 12cmfn (heart clip) :      INVASIVE DUCTAL CARCINOMA      TUBULE FORMATION: SCORE 3      NUCLEAR PLEOMORPHISM: SCORE 3      MITOTIC COUNT: SCORE 2      TOTAL SCORE: 8      OVERALL GRADE: 3      LYMPHOVASCULAR INVASION: NOT IDENTIFIED      CANCER LENGTH: 1.0 CM      CALCIFICATIONS: NOT IDENTIFIED      OTHER FINDINGS: NONE      ER negative, PR negative, HER2 negative (IHC 0) Ki-67 95%.       2. Breast, right, needle core biopsy, 12:00 8cmfn (coil clip) :      INVASIVE DUCTAL CARCINOMA      DUCTAL CARCINOMA IN SITU, SOLID, HIGH NUCLEAR GRADE WITH NECROSIS      TUBULE FORMATION: SCORE 3      NUCLEAR PLEOMORPHISM: SCORE 3      MITOTIC COUNT: SCORE 2      TOTAL SCORE: 8      OVERALL GRADE: 3      LYMPHOVASCULAR INVASION: NOT IDENTIFIED      CANCER LENGTH: 0.3 CM      CALCIFICATIONS: NOT IDENTIFIED      OTHER FINDINGS: NONE            3. Breast, right, needle core biopsy, 9:00 12cmfn (venus) :      BENIGN BREAST TISSUE WITH DENSE STROMAL FIBROSIS.      NEGATIVE FOR ATYPIA OR MALIGNANCY.    07/08/2024 Surgery   Patient went right breast lumpectomy and sentinel lymph node biopsy.  1. 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      - DUCTAL CARCINOMA IN  SITU (DCIS).      - SEE CANCER SUMMARY AND NOTE BELOW.      - TWO BIOPSY SITES WITH CORRESPONDING HEART AND COIL CLIPS.      - TWO SAVI SCOUT TAGS.       2. Lymph node, sentinel, biopsy, Right :      - MICROMETASTATIC CARCINOMA INVOLVES ONE OF FIVE LYMPH NODES (1MI/5).      - SEE CANCER SUMMARY AND NOTE BELOW.  3. Breast, excision, Right medial posterior margin :      - BENIGN BREAST TISSUE.      - NEGATIVE FOR ATYPIA AND MALIGNANCY.      - SEE CANCER SUMMARY BELOW.   TUMOR Histologic Type: Invasive carcinoma of no special type (ductal) Histologic Grade (Nottingham Histologic Score) Glandular (Acinar)/Tubular Differentiation: 3 Nuclear Pleomorphism: 3 Mitotic Rate: 3 Overall Grade: 3 Tumor Size: Greatest dimension of largest invasive focus: 19 mm Ductal Carcinoma In Situ (DCIS): Present, high-grade with comedonecrosis Lymphatic and/or Vascular Invasion: Present, extensive (LVI in 2 or more blocks)  Treatment Effect in the Breast: No known presurgical therapy MARGINS Margin Status for Invasive Carcinoma: Invasive Carcinoma involves inferior (unifocal) and superior (unifocal) margins and is 0.5 mm to anterior margin Margin Status for DCIS: All margins negative for DCIS Distance from DCIS to closest margin: 0.5 mm Specify closest margin: Inferior REGIONAL LYMPH NODES Regional Lymph Node Status: Tumor present in regional lymph node(s) Number of Lymph Nodes with Macrometastases (greater than 2 mm): 0 Number of Lymph Nodes with Micrometastases (greater than 0.2 mm to 2 mm and/or greater than 200 cells): 1 Number of Lymph Nodes with Isolated Tumor Cells (0.2 mm or less OR 200 cells or less): 0 Size of Largest Metastatic Deposit: 1.9 mm Extranodal Extension: Not identified Total Number of Lymph Nodes Examined (sentinel and non-sentinel): 5 Number of Sentinel Nodes Examined: 5 DISTANT METASTASIS Distant Site(s) Involved, if applicable: Not applicable PATHOLOGIC STAGE  CLASSIFICATION (pTNM, AJCC 8th Edition): Modified Classification: Not applicable pT Category: pT1c T Suffix: (m) multiple primary synchronous tumors in a single organ pN Category: pN94mi N Suffix: (sn) pM Category: Not applicable SPECIAL STUDIES Breast Biomarker Testing Performed on Previous Biopsy: [id number] (heart) Estrogen Receptor: Negative (0%) Progesterone Receptor: Negative (0%) HER2 IHC: Negative (0) Proliferation Marker Ki67: 95%   07/22/2024 Cancer Staging   Staging form: Breast, AJCC 8th Edition - Pathologic stage from 07/22/2024: Stage IB (pT1c, pN5mi(sn), cM0, G3, ER-, PR-, HER2-) - Signed by Babara Call, MD on 07/22/2024 Stage prefix: Initial diagnosis Method of lymph node assessment: Sentinel lymph node biopsy Multigene prognostic tests performed: None Histologic grading system: 3 grade system   07/25/2024 Surgery   Patient reports additional excision for positive margin.  1. Breast, excision, superior margin of right breast - RESIDUAL INVASIVE DUCTAL CARCINOMA, COMPLETELY EXCISED. - CARCINOMA PRESENT 2 MM FROM THE POSTERIOR MARGIN AND 4 MM FROM THE ANTERIOR AND SUPERIOR MARGINS. 2. Breast, excision, inferior margin of right breast - RESIDUAL INVASIVE DUCTAL CARCINOMA, COMPLETELY EXCISED. - CARCINOMA PRESENT 1 MM FROM THE NEW INFERIOR MARGIN.   08/23/2024 Genetic Testing   Negative genetic testing. No pathogenic variants identified on the [facility] CancerNext-Expanded+RNA Panel. The report date is 08/23/2024.  The CancerNext-Expanded gene panel offered by [facility] and includes sequencing, rearrangement, and RNA analysis for the following 77 genes: AIP, ALK, APC, ATM, AXIN2, BAP1, BARD1, BMPR1A, BRCA1, BRCA2, BRIP1, CDC73, CDH1, CDK4, CDKN1B, CDKN2A, CEBPA, CHEK2, CTNNA1, DDX41, DICER1, ETV6, FH, FLCN, GATA2, LZTR1, MAX, MBD4, MEN1, MET, MLH1, MSH2, MSH3, MSH6, MUTYH, NF1, NF2, NTHL1, PALB2, PHOX2B, PMS2, POT1, PRKAR1A, PTCH1, PTEN, RAD51C, RAD51D, RB1, RET, RPS20, RUNX1,  SDHA, SDHAF2, SDHB, SDHC, SDHD, SMAD4, SMARCA4, SMARCB1, SMARCE1, STK11, SUFU, TMEM127, TP53, TSC1, TSC2, VHL, and WT1 (sequencing and deletion/duplication); EGFR, HOXB13, KIT, MITF, PDGFRA, POLD1, and POLE (sequencing only); EPCAM and GREM1 (deletion/duplication only).    10/23/2024 -  Chemotherapy   Patient is on Treatment Plan : BREAST DOSE DENSE AC q14d / PACLitaxel q7d  Discussed the use of AI scribe software for clinical note transcription with the patient, who gave verbal consent to proceed.   Patient has a history of VIN 3 status post vulvectomy  07/11/2024,she was evaluated by cardiology with palpitations and atypical chest pain. Chest pain deemed atypical and she went on to have a normal echo at 07/19/2024.   07/23/2024, patient was seen by symptom management for right neck pain.  She was seen by Tinnie Dawn.  Right upper extremity ultrasound was obtained which showed acute occlusive DVT involving the right internal jugular and subclavian veins.  Patient was started on Lovenox , she was supposed to be started on 1 mg/kg twice daily however was started off 40 mg twice daily which is a subtherapeutic dosage. She was asked to hold off the evening dose prior to her breast re-excision.  07/29/2024, patient presented emergency room due to shortness of breath and chest pain.  CTA angiogram showed subsegmental PE medially in the left lower lobe.  Shortness of breath was felt to be multifactorial secondary to asthma/bronchitis, pulmonary embolism.  Symptoms are better after supportive care and she was discharged on Lovenox  80 mg subcutaneously twice daily.  08/31/2024 -09/06/2024 hospitalized due to sepsis due to multifocal pneumonia, UTI. Respiratory viral panel positive for metapneumovirus  Patient resumed on radiation on 09/16/24.  Patient took Xeloda  twice daily and continued radiation till 09/19/2024.  She was not able to continue radiation on 09/20/2024 due to severe diarrhea.  11/7 radiation  was not given due to patient's symptoms Final radiation was done on 09/25/2024. Last dose of Xeloda  was omitted.     INTERVAL HISTORY Monica Gonzalez is a 48 y.o. female who has above history reviewed by me today presents for follow up visit prior to chemo  Discussed the use of AI scribe software for clinical note transcription with the patient, who gave verbal consent to proceed.    She reports several days of persistent, watery diarrhea, with frequent episodes including a significant accident three days prior to the visit, during which she had eight to nine bowel movements in one day. She has poor oral intake, decreased appetite, and difficulty tolerating food, but denies nausea, vomiting, or hematochezia. She is using loperamide  as needed, with a dose taken the morning of the visit, and has not had a bowel movement today. She expresses concern that nutritional supplements may exacerbate her diarrhea.  She recently completed a course of antibiotics for urinary tract symptoms, though urine cultures eventually came back negative.  Dysuria has completely resolved.  She notes weight loss and reports lightheadedness, dizziness, and a near-syncope episode prior to the visit. She is not taking antihypertensive medications and consumes salty foods.  She developed bone pain in all major joints, particularly the knees, beginning on the second day after GCSF injection. The bone pain lasted a couple of days and has resolved. She used Claritin without relief and took Tylenol , ibuprofen , and oxycodone  for pain control. She has underlying fibromyalgia, which she feels exacerbates her joint pain during episodes of bone pain.  She denies much of nausea or vomiting.  She is experiencing significant chemotherapy-induced alopecia, with clumps of hair loss over the past four days, and is considering cutting her hair short for comfort.   MEDICAL HISTORY:  Past Medical History:  Diagnosis Date   Anemia    Anginal  pain    Anxiety    Arthritis    Asthma    Cancer (HCC)    rectal cancer, breast  cancer 2025   Chronic pain syndrome 05/21/2023   DDD (degenerative disc disease), cervical 05/22/2023   Deep venous thrombosis (HCC)    Depression    Dyspnea    Fibromyalgia    GERD (gastroesophageal reflux disease)    Headache    Heart murmur    History of hiatal hernia    History of kidney stones    Lupus    Malignant neoplasm of upper-outer quadrant of right female breast, unspecified estrogen receptor status (HCC) 2025   Pneumonia    PTSD (post-traumatic stress disorder)    Skin candidiasis 05/29/2024   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    SURGICAL HISTORY: Past Surgical History:  Procedure Laterality Date   BREAST BIOPSY Right 06/18/2024   US  RT BREAST BX W LOC DEV 1ST LESION IMG BX SPEC US  GUIDE 06/18/2024 [facility]   BREAST BIOPSY Right 06/18/2024   US  RT BREAST BX W LOC DEV EA ADD LESION IMG BX SPEC US  GUIDE 06/18/2024 [facility]   BREAST BIOPSY Right 06/18/2024   US  RT BREAST BX W LOC DEV EA ADD LESION IMG BX SPEC US  GUIDE 06/18/2024 [facility]   BREAST BIOPSY Right 07/05/2024   US  RT BREAST SAVI/RF TAG 1ST LESION US  GUIDE 07/05/2024 [facility]   BREAST BIOPSY Right 07/05/2024   US  RT BREAST SAVI/RF TAG EA ADD'L LESION US  GUIDE 07/05/2024 [facility]   BREAST LUMPECTOMY Right 07/25/2024   Procedure: BREAST LUMPECTOMY;  Surgeon: Tye Millet, DO;  Location: [facility];  Service: General;  Laterality: Right;   BREAST LUMPECTOMY WITH RADIO FREQUENCY LOCALIZER Right 07/08/2024   Procedure: BREAST LUMPECTOMY WITH RADIO FREQUENCY LOCALIZER;  Surgeon: Tye Millet, DO;  Location: [facility];  Service: General;  Laterality: Right;   COLONOSCOPY WITH PROPOFOL  N/A 02/17/2023   Procedure: COLONOSCOPY WITH PROPOFOL ;  Surgeon: Maryruth Ole DASEN, MD;  Location: [facility];  Service: Endoscopy;  Laterality: N/A;   ESOPHAGOGASTRODUODENOSCOPY (EGD) WITH  PROPOFOL  N/A 02/17/2023   Procedure: ESOPHAGOGASTRODUODENOSCOPY (EGD) WITH PROPOFOL ;  Surgeon: Maryruth Ole DASEN, MD;  Location: [facility];  Service: Endoscopy;  Laterality: N/A;   EXTRACORPOREAL SHOCK WAVE LITHOTRIPSY     IR CV LINE INJECTION  08/21/2024   IR REMOVAL TUN ACCESS W/ PORT W/O FL MOD SED  08/28/2024   PICC LINE INSERTION N/A 08/2024   PORTA CATH INSERTION N/A 10/14/2024   Procedure: PORTA CATH INSERTION;  Surgeon: Marea Selinda RAMAN, MD;  Location: [facility];  Service: Cardiovascular;  Laterality: N/A;   PORTA CATH REMOVAL N/A 10/14/2024   Procedure: PORTA CATH REMOVAL;  Surgeon: Marea Selinda RAMAN, MD;  Location: [facility];  Service: Cardiovascular;  Laterality: N/A;   PORTACATH PLACEMENT Right 07/08/2024   Procedure: INSERTION, TUNNELED CENTRAL VENOUS DEVICE, WITH PORT;  Surgeon: Tye Millet, DO;  Location: [facility];  Service: General;  Laterality: Right;   RECTAL EXAM UNDER ANESTHESIA N/A 05/16/2024   Procedure: EXAM UNDER ANESTHESIA, RECTUM, INCISIONAL BIOPSY OF ANAL MASS;  Surgeon: Tye Millet, DO;  Location: [facility];  Service: General;  Laterality: N/A;   SENTINEL NODE BIOPSY Right 07/08/2024   Procedure: BIOPSY, LYMPH NODE, SENTINEL;  Surgeon: Tye Millet, DO;  Location: [facility];  Service: General;  Laterality: Right;   TONSILLECTOMY     TUBAL LIGATION     VULVECTOMY N/A 07/29/2020   Procedure: WIDE EXCISION VULVECTOMY;  Surgeon: Mancil Barter, MD;  Location: [facility];  Service: Gynecology;  Laterality: N/A;    SOCIAL HISTORY: Social History   Socioeconomic History  Marital status: Divorced    Spouse name: Not on file   Number of children: 1   Years of education: Not on file   Highest education level: Not on file  Occupational History   Not on file  Tobacco Use   Smoking status: Former    Types: Cigarettes   Smokeless tobacco: Never  Vaping Use   Vaping status: Never Used  Substance and Sexual Activity   Alcohol use: Not Currently    Drug use: No   Sexual activity: Yes  Other Topics Concern   Not on file  Social History Narrative   Not on file   Social Drivers of Health   Tobacco Use: Medium Risk (11/11/2024)   Patient History    Smoking Tobacco Use: Former    Smokeless Tobacco Use: Never    Passive Exposure: Not on file  Financial Resource Strain: Medium Risk (05/15/2024)   Received from [facility]   Overall Financial Resource Strain (CARDIA)    Difficulty of Paying Living Expenses: Somewhat hard  Food Insecurity: No Food Insecurity (09/07/2024)   Epic    Worried About Programme Researcher, Broadcasting/film/video in the Last Year: Never true    Ran Out of Food in the Last Year: Never true  Recent Concern: Food Insecurity - Food Insecurity Present (08/22/2024)   Epic    Worried About Programme Researcher, Broadcasting/film/video in the Last Year: Sometimes true    Ran Out of Food in the Last Year: Sometimes true  Transportation Needs: No Transportation Needs (09/07/2024)   Epic    Lack of Transportation (Medical): No    Lack of Transportation (Non-Medical): No  Physical Activity: Not on file  Stress: Not on file  Social Connections: Not on file  Intimate Partner Violence: Not At Risk (09/07/2024)   Epic    Fear of Current or Ex-Partner: No    Emotionally Abused: No    Physically Abused: No    Sexually Abused: No  Depression (PHQ2-9): Low Risk (10/23/2024)   Depression (PHQ2-9)    PHQ-2 Score: 0  Alcohol Screen: Low Risk (10/16/2024)   Alcohol Screen    Last Alcohol Screening Score (AUDIT): 0  Housing: Low Risk (10/16/2024)   Epic    Unable to Pay for Housing in the Last Year: No    Number of Times Moved in the Last Year: 1    Homeless in the Last Year: No  Utilities: Not At Risk (09/07/2024)   Epic    Threatened with loss of utilities: No  Health Literacy: Not on file    FAMILY HISTORY: Family History  Problem Relation Age of Onset   Diabetes Mother    Congestive Heart Failure Mother    Colon cancer Father 47   Lupus  Maternal Grandmother    Cancer Maternal Grandfather        either prostate or colon   Colon cancer Paternal Grandfather    Lung cancer Paternal Grandfather     ALLERGIES:  is allergic to prednisone and silicone.  MEDICATIONS:  Current Outpatient Medications  Medication Sig Dispense Refill   acetaminophen  (TYLENOL ) 500 MG tablet Take 500 mg by mouth every 6 (six) hours as needed.     albuterol  (VENTOLIN  HFA) 108 (90 Base) MCG/ACT inhaler Inhale 2 puffs into the lungs every 4 (four) hours as needed for wheezing or shortness of breath. 8.5 g 2   ascorbic acid (VITAMIN C) 500 MG tablet Take 500 mg by mouth daily.  azelastine  (ASTELIN ) 0.1 % nasal spray Place 1 spray into both nostrils 2 (two) times daily. Use in each nostril as directed 30 mL 0   benzonatate  (TESSALON ) 200 MG capsule Take 1 capsule (200 mg total) by mouth 3 (three) times daily as needed for cough. 30 capsule 1   BIOTIN PO Take by mouth daily.     budesonide  (PULMICORT ) 0.25 MG/2ML nebulizer solution Take 2 mLs (0.25 mg total) by nebulization 2 (two) times daily. 60 mL 12   busPIRone  (BUSPAR ) 5 MG tablet Take 5 mg by mouth 2 (two) times daily.     cholecalciferol (VITAMIN D3) 25 MCG (1000 UNIT) tablet Take 1,000 Units by mouth daily.     dexamethasone  (DECADRON ) 4 MG tablet Take 2 tablets (8 mg total) by mouth daily for 2 days. Start the day after doxorubicin /cyclophosphamide  chemotherapy. Take with food. 30 tablet 1   DULoxetine  (CYMBALTA ) 30 MG capsule Take 30 mg by mouth at bedtime.     enoxaparin  (LOVENOX ) 80 MG/0.8ML injection Inject 0.8 mLs (80 mg total) into the skin every 12 (twelve) hours. 48 mL 2   esomeprazole  (NEXIUM ) 40 MG capsule Take 1 capsule (40 mg total) by mouth 2 (two) times daily before a meal. 60 capsule 2   gabapentin  (NEURONTIN ) 300 MG capsule Take 600 mg by mouth 2 (two) times daily.     ibuprofen  (ADVIL ) 200 MG tablet Take 200 mg by mouth every 6 (six) hours as needed.     ipratropium-albuterol   (DUONEB) 0.5-2.5 (3) MG/3ML SOLN Take 3 mLs by nebulization 4 (four) times daily. 360 mL 3   lactose free nutrition (BOOST PLUS) LIQD Take 237 mLs by mouth 2 (two) times daily between meals. 237 mL 11   lidocaine -prilocaine  (EMLA ) cream Apply 1 Application topically as needed. 30 g 3   linaclotide (LINZESS) 145 MCG CAPS capsule Take 145 mcg by mouth daily before breakfast.     LORazepam  (ATIVAN ) 0.5 MG tablet Take 1 tablet (0.5 mg total) by mouth every 12 (twelve) hours as needed for anxiety (nausea). 30 tablet 0   montelukast  (SINGULAIR ) 10 MG tablet Take 1 tablet (10 mg total) by mouth See admin instructions. Take 1 tablet on the day prior to chemotherapy and take 1 tablet daily for 2 days after chemotherapy. 30 tablet 0   Multiple Vitamin (MULTIVITAMIN WITH MINERALS) TABS tablet Take 1 tablet by mouth daily.     nystatin  (MYCOSTATIN ) 100000 UNIT/ML suspension Take 5 mLs (500,000 Units total) by mouth 4 (four) times daily. Swish and swallow 473 mL 1   oxyCODONE  (OXY IR/ROXICODONE ) 5 MG immediate release tablet Take 1 tablet (5 mg total) by mouth every 6 (six) hours as needed for severe pain (pain score 7-10) or breakthrough pain. 15 tablet 0   potassium chloride  SA (KLOR-CON  M) 20 MEQ tablet Take 1 tablet (20 mEq total) by mouth 2 (two) times daily. 60 tablet 0   prochlorperazine  (COMPAZINE ) 10 MG tablet Take 1 tablet (10 mg total) by mouth every 6 (six) hours as needed for nausea or vomiting. 30 tablet 1   promethazine  (PHENERGAN ) 25 MG tablet Take 1 tablet (25 mg total) by mouth every 8 (eight) hours as needed for nausea or vomiting. 60 tablet 0   diphenoxylate -atropine  (LOMOTIL ) 2.5-0.025 MG tablet Take 1 tablet by mouth 4 (four) times daily. 90 tablet 0   naloxone  (NARCAN ) nasal spray 4 mg/0.1 mL SPRAY 1 SPRAY INTO ONE NOSTRIL AS DIRECTED FOR OPIOID OVERDOSE (TURN PERSON ON SIDE AFTER DOSE. IF  NO RESPONSE IN 2-3 MINUTES OR PERSON RESPONDS BUT RELAPSES, REPEAT USING A NEW SPRAY DEVICE AND SPRAY  INTO THE OTHER NOSTRIL. CALL 911 AFTER USE.) * EMERGENCY USE ONLY * (Patient not taking: Reported on 11/11/2024) 1 each 0   traZODone  (DESYREL ) 50 MG tablet Take 1 tablet (50 mg total) by mouth at bedtime. 30 tablet 2   Turmeric (QC TUMERIC COMPLEX PO) Take by mouth daily. (Patient not taking: Reported on 11/11/2024)     No current facility-administered medications for this visit.    Review of Systems  Constitutional:  Positive for fatigue. Negative for chills and fever.  HENT:   Negative for hearing loss and voice change.        Nasal congestion  Eyes:  Negative for eye problems.  Respiratory:  Negative for chest tightness, cough and shortness of breath.   Cardiovascular:  Negative for chest pain.  Gastrointestinal:  Positive for diarrhea. Negative for abdominal distention, abdominal pain, blood in stool and nausea.  Endocrine: Negative for hot flashes.  Genitourinary:  Negative for difficulty urinating, dysuria and frequency.   Musculoskeletal:  Negative for arthralgias.  Skin:  Negative for itching and rash.  Neurological:  Positive for light-headedness. Negative for extremity weakness.  Hematological:  Negative for adenopathy.  Psychiatric/Behavioral:  Negative for confusion.      PHYSICAL EXAMINATION: ECOG PERFORMANCE STATUS: 0 - Asymptomatic  Vitals:   11/11/24 1405 11/11/24 1412  BP: (!) 92/49 98/81  Pulse: 89   Resp: 18   Temp: (!) 97.5 F (36.4 C)    Filed Weights   11/11/24 1405  Weight: 177 lb 6.4 oz (80.5 kg)    Physical Exam Constitutional:      General: She is not in acute distress.    Appearance: She is not diaphoretic.  HENT:     Head: Normocephalic and atraumatic.     Mouth/Throat:     Pharynx: No oropharyngeal exudate.  Eyes:     General: No scleral icterus. Cardiovascular:     Rate and Rhythm: Normal rate and regular rhythm.     Heart sounds: No murmur heard. Pulmonary:     Effort: Pulmonary effort is normal. No respiratory distress.      Breath sounds: No wheezing.  Abdominal:     General: There is no distension.     Palpations: Abdomen is soft.     Tenderness: There is no abdominal tenderness.  Musculoskeletal:        General: Normal range of motion.     Cervical back: Normal range of motion and neck supple.  Skin:    General: Skin is warm and dry.     Findings: No erythema.     Comments: Left chest wall medi port, mild erythematous changes at the incision site.   Neurological:     Mental Status: She is alert and oriented to person, place, and time. Mental status is at baseline.     Motor: No abnormal muscle tone.  Psychiatric:        Mood and Affect: Affect normal.         LABORATORY DATA:  I have reviewed the data as listed    Latest Ref Rng & Units 11/11/2024    1:26 PM 10/30/2024    8:37 AM 10/23/2024    8:44 AM  CBC  WBC 4.0 - 10.5 K/uL 7.0  1.1  5.2   Hemoglobin 12.0 - 15.0 g/dL 87.6  89.6  87.7   Hematocrit 36.0 - 46.0 % 37.7  32.3  38.0   Platelets 150 - 400 K/uL 503  110  346       Latest Ref Rng & Units 11/11/2024    1:26 PM 10/30/2024    8:37 AM 10/23/2024    8:44 AM  CMP  Glucose 70 - 99 mg/dL 852  859  874   BUN 6 - 20 mg/dL 5  11  6    Creatinine 0.44 - 1.00 mg/dL 9.03  9.29  9.10   Sodium 135 - 145 mmol/L 141  137  139   Potassium 3.5 - 5.1 mmol/L 3.7  4.2  4.1   Chloride 98 - 111 mmol/L 103  103  104   CO2 22 - 32 mmol/L 23  24  23    Calcium 8.9 - 10.3 mg/dL 9.6  9.1  9.5   Total Protein 6.5 - 8.1 g/dL 6.7  6.0  6.5   Total Bilirubin 0.0 - 1.2 mg/dL 0.4  0.6  0.6   Alkaline Phos 38 - 126 U/L 83  92  90   AST 15 - 41 U/L 35  37  30   ALT 0 - 44 U/L 26  34  21      RADIOGRAPHIC STUDIES: I have personally reviewed the radiological images as listed and agreed with the findings in the report. PERIPHERAL VASCULAR CATHETERIZATION Result Date: 10/14/2024 See surgical note for result.   "

## 2024-11-11 NOTE — Assessment & Plan Note (Signed)
 She receives Day 3 long acting GCSF Neutropenia precaution.

## 2024-11-11 NOTE — Assessment & Plan Note (Addendum)
 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 mT1c pN3mi Initial margin was positive and patient underwent reexcision and achieved a negative margin.  Recommend  adjuvant ddAC followed by weekly Taxol. Baseline Echo LVEF 60-65%.Rationale and side effects were reviewed with patient.  Labs are reviewed and discussed with patient. Motorcycle trip ddAC with D3 GCSF.  Due to diarrhea. Supportive care. Hydration session today

## 2024-11-11 NOTE — Assessment & Plan Note (Signed)
 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  Per IR, right inguinal lymph node is not enlarged.  Previously on concurrent chemotherapy 5-FU and mitomycin  C with Radiation. S/p D1 5-FU and Mitomycin -C. D29 5-FU infusion was switched to concurrent Xeloda  due to lack of medi port.- she has picc line. Received D29 Mitomycin  C. Chemo was interrupted due to multi focal pneumonia.  S/p concurrent Xeloda  with Radiation [last RT 09/24/2024 .   Future plan - DRE 8-12 weeks after treatment - Jan 2026. I will re image after she finishes adjuvant breast chemotherapy

## 2024-11-11 NOTE — Assessment & Plan Note (Signed)
 Continue Lovenox 1 mg/kg twice daily

## 2024-11-13 ENCOUNTER — Inpatient Hospital Stay

## 2024-11-13 NOTE — Progress Notes (Signed)
 Nutrition Follow-up:  Patient with 2 primary cancers (anal and breast).  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  Receiving [facility] for breast cancer.  Met with patient today in clinic.  Chemo held earlier this week.  Reports that appetite is still poor.  Has not eaten anything today but drank a boost shake before coming to clinic and took lomotil .Reports was able to eat some Chinese food yesterday.  Otherwise very limited oral intake.  Drinks water , soda, tea  Patient did receive boost plus shakes from [employer].   Medications: reviewed  Labs: reviewed  Anthropometrics:   Weight 177 lb on 12/29 183 lb on 12/10 195 lb on 09/17/24 191-200 lb July-Sept 2025  9% weight loss in the last 2 months, significant    NUTRITION DIAGNOSIS: Inadequate oral intake continues    INTERVENTION:  Samples of banatrol plus given to patient to try for diarrhea. Can be used in addition to other diarrhea medications. Discussed how to order more with patient. Encouraged trying shakes with food on stomach or diluting with lactaid milk, almond milk or Fairlife milk Consider appetite stimulant with significant weight loss and poor appetite.  Message sent to MD.      MONITORING, EVALUATION, GOAL: weight trends, intake   NEXT VISIT: as needed with treatment  Kamiya Acord B. Dasie SOLON, CSO, LDN Registered Dietitian [phone]

## 2024-11-19 MED FILL — Fosaprepitant Dimeglumine For IV Infusion 150 MG (Base Eq): INTRAVENOUS | Qty: 5 | Status: AC

## 2024-11-20 ENCOUNTER — Inpatient Hospital Stay: Attending: Oncology | Admitting: Oncology

## 2024-11-20 ENCOUNTER — Inpatient Hospital Stay

## 2024-11-20 ENCOUNTER — Encounter: Payer: Self-pay | Admitting: Oncology

## 2024-11-20 ENCOUNTER — Inpatient Hospital Stay: Attending: Oncology

## 2024-11-20 VITALS — BP 98/63 | HR 75 | Resp 18

## 2024-11-20 VITALS — BP 97/71 | HR 93 | Temp 97.2°F | Resp 18 | Wt 178.8 lb

## 2024-11-20 DIAGNOSIS — R590 Localized enlarged lymph nodes: Secondary | ICD-10-CM | POA: Insufficient documentation

## 2024-11-20 DIAGNOSIS — Z79630 Long term (current) use of alkylating agent: Secondary | ICD-10-CM | POA: Insufficient documentation

## 2024-11-20 DIAGNOSIS — G629 Polyneuropathy, unspecified: Secondary | ICD-10-CM | POA: Diagnosis not present

## 2024-11-20 DIAGNOSIS — J45909 Unspecified asthma, uncomplicated: Secondary | ICD-10-CM | POA: Diagnosis not present

## 2024-11-20 DIAGNOSIS — Z9221 Personal history of antineoplastic chemotherapy: Secondary | ICD-10-CM | POA: Insufficient documentation

## 2024-11-20 DIAGNOSIS — R110 Nausea: Secondary | ICD-10-CM | POA: Diagnosis not present

## 2024-11-20 DIAGNOSIS — Z923 Personal history of irradiation: Secondary | ICD-10-CM | POA: Diagnosis not present

## 2024-11-20 DIAGNOSIS — Z86718 Personal history of other venous thrombosis and embolism: Secondary | ICD-10-CM | POA: Insufficient documentation

## 2024-11-20 DIAGNOSIS — C50919 Malignant neoplasm of unspecified site of unspecified female breast: Secondary | ICD-10-CM

## 2024-11-20 DIAGNOSIS — T451X5A Adverse effect of antineoplastic and immunosuppressive drugs, initial encounter: Secondary | ICD-10-CM | POA: Insufficient documentation

## 2024-11-20 DIAGNOSIS — I878 Other specified disorders of veins: Secondary | ICD-10-CM | POA: Insufficient documentation

## 2024-11-20 DIAGNOSIS — D701 Agranulocytosis secondary to cancer chemotherapy: Secondary | ICD-10-CM | POA: Insufficient documentation

## 2024-11-20 DIAGNOSIS — C210 Malignant neoplasm of anus, unspecified: Secondary | ICD-10-CM | POA: Insufficient documentation

## 2024-11-20 DIAGNOSIS — Z87412 Personal history of vulvar dysplasia: Secondary | ICD-10-CM | POA: Insufficient documentation

## 2024-11-20 DIAGNOSIS — R011 Cardiac murmur, unspecified: Secondary | ICD-10-CM | POA: Insufficient documentation

## 2024-11-20 DIAGNOSIS — K760 Fatty (change of) liver, not elsewhere classified: Secondary | ICD-10-CM | POA: Insufficient documentation

## 2024-11-20 DIAGNOSIS — C50411 Malignant neoplasm of upper-outer quadrant of right female breast: Secondary | ICD-10-CM | POA: Diagnosis not present

## 2024-11-20 DIAGNOSIS — Z7951 Long term (current) use of inhaled steroids: Secondary | ICD-10-CM | POA: Insufficient documentation

## 2024-11-20 DIAGNOSIS — R630 Anorexia: Secondary | ICD-10-CM | POA: Diagnosis not present

## 2024-11-20 DIAGNOSIS — M5030 Other cervical disc degeneration, unspecified cervical region: Secondary | ICD-10-CM | POA: Diagnosis not present

## 2024-11-20 DIAGNOSIS — I2693 Single subsegmental pulmonary embolism without acute cor pulmonale: Secondary | ICD-10-CM | POA: Insufficient documentation

## 2024-11-20 DIAGNOSIS — Z87442 Personal history of urinary calculi: Secondary | ICD-10-CM | POA: Insufficient documentation

## 2024-11-20 DIAGNOSIS — M129 Arthropathy, unspecified: Secondary | ICD-10-CM | POA: Diagnosis not present

## 2024-11-20 DIAGNOSIS — M797 Fibromyalgia: Secondary | ICD-10-CM | POA: Insufficient documentation

## 2024-11-20 DIAGNOSIS — K0889 Other specified disorders of teeth and supporting structures: Secondary | ICD-10-CM | POA: Insufficient documentation

## 2024-11-20 DIAGNOSIS — R197 Diarrhea, unspecified: Secondary | ICD-10-CM | POA: Insufficient documentation

## 2024-11-20 DIAGNOSIS — K5900 Constipation, unspecified: Secondary | ICD-10-CM | POA: Diagnosis not present

## 2024-11-20 DIAGNOSIS — Z801 Family history of malignant neoplasm of trachea, bronchus and lung: Secondary | ICD-10-CM | POA: Insufficient documentation

## 2024-11-20 DIAGNOSIS — D1803 Hemangioma of intra-abdominal structures: Secondary | ICD-10-CM | POA: Insufficient documentation

## 2024-11-20 DIAGNOSIS — Z171 Estrogen receptor negative status [ER-]: Secondary | ICD-10-CM | POA: Insufficient documentation

## 2024-11-20 DIAGNOSIS — Z86711 Personal history of pulmonary embolism: Secondary | ICD-10-CM | POA: Insufficient documentation

## 2024-11-20 DIAGNOSIS — Z800 Family history of malignant neoplasm of digestive organs: Secondary | ICD-10-CM | POA: Insufficient documentation

## 2024-11-20 DIAGNOSIS — I2699 Other pulmonary embolism without acute cor pulmonale: Secondary | ICD-10-CM

## 2024-11-20 DIAGNOSIS — Z8701 Personal history of pneumonia (recurrent): Secondary | ICD-10-CM | POA: Insufficient documentation

## 2024-11-20 DIAGNOSIS — Z87891 Personal history of nicotine dependence: Secondary | ICD-10-CM | POA: Insufficient documentation

## 2024-11-20 DIAGNOSIS — Z7952 Long term (current) use of systemic steroids: Secondary | ICD-10-CM | POA: Insufficient documentation

## 2024-11-20 DIAGNOSIS — Z79632 Long term (current) use of antitumor antibiotic: Secondary | ICD-10-CM | POA: Insufficient documentation

## 2024-11-20 DIAGNOSIS — Z79899 Other long term (current) drug therapy: Secondary | ICD-10-CM | POA: Insufficient documentation

## 2024-11-20 LAB — CMP (CANCER CENTER ONLY)
ALT: 38 U/L (ref 0–44)
AST: 44 U/L — ABNORMAL HIGH (ref 15–41)
Albumin: 3.8 g/dL (ref 3.5–5.0)
Alkaline Phosphatase: 85 U/L (ref 38–126)
Anion gap: 11 (ref 5–15)
BUN: 11 mg/dL (ref 6–20)
CO2: 22 mmol/L (ref 22–32)
Calcium: 9.1 mg/dL (ref 8.9–10.3)
Chloride: 107 mmol/L (ref 98–111)
Creatinine: 0.81 mg/dL (ref 0.44–1.00)
GFR, Estimated: >60 mL/min
Glucose, Bld: 133 mg/dL — ABNORMAL HIGH (ref 70–99)
Potassium: 4 mmol/L (ref 3.5–5.1)
Sodium: 141 mmol/L (ref 135–145)
Total Bilirubin: 0.3 mg/dL (ref 0.0–1.2)
Total Protein: 6.1 g/dL — ABNORMAL LOW (ref 6.5–8.1)

## 2024-11-20 LAB — CBC WITH DIFFERENTIAL (CANCER CENTER ONLY)
Abs Immature Granulocytes: 0.04 K/uL (ref 0.00–0.07)
Basophils Absolute: 0.1 K/uL (ref 0.0–0.1)
Basophils Relative: 2 %
Eosinophils Absolute: 0 K/uL (ref 0.0–0.5)
Eosinophils Relative: 1 %
HCT: 35 % — ABNORMAL LOW (ref 36.0–46.0)
Hemoglobin: 11.1 g/dL — ABNORMAL LOW (ref 12.0–15.0)
Immature Granulocytes: 1 %
Lymphocytes Relative: 12 %
Lymphs Abs: 0.5 K/uL — ABNORMAL LOW (ref 0.7–4.0)
MCH: 32.1 pg (ref 26.0–34.0)
MCHC: 31.7 g/dL (ref 30.0–36.0)
MCV: 101.2 fL — ABNORMAL HIGH (ref 80.0–100.0)
Monocytes Absolute: 0.8 K/uL (ref 0.1–1.0)
Monocytes Relative: 17 %
Neutro Abs: 2.9 K/uL (ref 1.7–7.7)
Neutrophils Relative %: 67 %
Platelet Count: 310 K/uL (ref 150–400)
RBC: 3.46 MIL/uL — ABNORMAL LOW (ref 3.87–5.11)
RDW: 15.1 % (ref 11.5–15.5)
WBC Count: 4.4 K/uL (ref 4.0–10.5)
nRBC: 0 % (ref 0.0–0.2)

## 2024-11-20 MED ORDER — GABAPENTIN 300 MG PO CAPS: 600.0000 mg | ORAL_CAPSULE | Freq: Three times a day (TID) | ORAL | 2 refills | Status: AC

## 2024-11-20 MED ORDER — SODIUM CHLORIDE 0.9 % IV SOLN
INTRAVENOUS | Status: DC
  Filled 2024-11-20: qty 250

## 2024-11-20 MED ORDER — MEGESTROL ACETATE 40 MG PO TABS: 80.0000 mg | ORAL_TABLET | Freq: Two times a day (BID) | ORAL | 0 refills | Status: AC

## 2024-11-20 MED ORDER — DEXAMETHASONE SOD PHOSPHATE PF 10 MG/ML IJ SOLN
10.0000 mg | Freq: Once | INTRAMUSCULAR | Status: AC
  Administered 2024-11-20: 10 mg via INTRAVENOUS
  Filled 2024-11-20: qty 1

## 2024-11-20 MED ORDER — DOXORUBICIN HCL CHEMO IV INJECTION 2 MG/ML
60.0000 mg/m2 | Freq: Once | INTRAVENOUS | Status: AC
  Administered 2024-11-20: 116 mg via INTRAVENOUS
  Filled 2024-11-20: qty 58

## 2024-11-20 MED ORDER — AMOXICILLIN 500 MG PO CAPS: 500.0000 mg | ORAL_CAPSULE | Freq: Two times a day (BID) | ORAL | 0 refills | Status: DC

## 2024-11-20 MED ORDER — CHLORHEXIDINE GLUCONATE 0.12 % MT SOLN: 10.0000 mL | Freq: Two times a day (BID) | OROMUCOSAL | 0 refills | Status: DC

## 2024-11-20 MED ORDER — SODIUM CHLORIDE 0.9 % IV SOLN
150.0000 mg | Freq: Once | INTRAVENOUS | Status: AC
  Administered 2024-11-20: 150 mg via INTRAVENOUS
  Filled 2024-11-20: qty 150
  Filled 2024-11-20: qty 5

## 2024-11-20 MED ORDER — PALONOSETRON HCL INJECTION 0.25 MG/5ML
0.2500 mg | Freq: Once | INTRAVENOUS | Status: AC
  Administered 2024-11-20: 0.25 mg via INTRAVENOUS
  Filled 2024-11-20: qty 5

## 2024-11-20 MED ORDER — SODIUM CHLORIDE 0.9 % IV SOLN
600.0000 mg/m2 | Freq: Once | INTRAVENOUS | Status: AC
  Administered 2024-11-20: 1160 mg via INTRAVENOUS
  Filled 2024-11-20: qty 58

## 2024-11-20 NOTE — Assessment & Plan Note (Signed)
 Discussed about option of adding Megace  80 mg twice daily. Rationale and potential side effects reviewed and discussed with patient.  She agrees.  Prescription was sent to pharmacy.

## 2024-11-20 NOTE — Patient Instructions (Signed)
 [facility] - A DEPT OF [facility]  Discharge Instructions: Thank you for choosing [facility] to provide your oncology and hematology care.  If you have a lab appointment with the Cancer Center, please go directly to the Cancer Center and check in at the registration area.  Wear comfortable clothing and clothing appropriate for easy access to any Portacath or PICC line.   We strive to give you quality time with your provider. You may need to reschedule your appointment if you arrive late (15 or more minutes).  Arriving late affects you and other patients whose appointments are after yours.  Also, if you miss three or more appointments without notifying the office, you may be dismissed from the clinic at the providers discretion.      For prescription refill requests, have your pharmacy contact our office and allow 72 hours for refills to be completed.    Today you received the following chemotherapy and/or immunotherapy agents Doxorubicin , Cyclophosphamide       To help prevent nausea and vomiting after your treatment, we encourage you to take your nausea medication as directed.  BELOW ARE SYMPTOMS THAT SHOULD BE REPORTED IMMEDIATELY: *FEVER GREATER THAN 100.4 F (38 C) OR HIGHER *CHILLS OR SWEATING *NAUSEA AND VOMITING THAT IS NOT CONTROLLED WITH YOUR NAUSEA MEDICATION *UNUSUAL SHORTNESS OF BREATH *UNUSUAL BRUISING OR BLEEDING *URINARY PROBLEMS (pain or burning when urinating, or frequent urination) *BOWEL PROBLEMS (unusual diarrhea, constipation, pain near the anus) TENDERNESS IN MOUTH AND THROAT WITH OR WITHOUT PRESENCE OF ULCERS (sore throat, sores in mouth, or a toothache) UNUSUAL RASH, SWELLING OR PAIN  UNUSUAL VAGINAL DISCHARGE OR ITCHING   Items with * indicate a potential emergency and should be followed up as soon as possible or go to the Emergency Department if any problems should occur.  Please show the CHEMOTHERAPY ALERT CARD  or IMMUNOTHERAPY ALERT CARD at check-in to the Emergency Department and triage nurse.  Should you have questions after your visit or need to cancel or reschedule your appointment, please contact [facility] - A DEPT OF [facility]  [phone] and follow the prompts.  Office hours are 8:00 a.m. to 4:30 p.m. Monday - Friday. Please note that voicemails left after 4:00 p.m. may not be returned until the following business day.  We are closed weekends and major holidays. You have access to a nurse at all times for urgent questions. Please call the main number to the clinic [phone] and follow the prompts.  For any non-urgent questions, you may also contact your provider using MyChart. We now offer e-Visits for anyone 68 and older to request care online for non-urgent symptoms. For details visit [email or web address].   Also download the MyChart app! Go to the app store, search MyChart, open the app, select [facility], and log in with your MyChart username and password.  Doxorubicin  Injection What is this medication? DOXORUBICIN  (dox oh ROO bi sin) treats some types of cancer. It works by slowing down the growth of cancer cells. This medicine may be used for other purposes; ask your health care provider or pharmacist if you have questions. COMMON BRAND NAME(S): Adriamycin , Adriamycin  PFS, Adriamycin  RDF, Rubex  What should I tell my care team before I take this medication? They need to know if you have any of these conditions: Heart disease History of low blood cell levels caused by a medication Liver disease Recent or ongoing radiation An  unusual or allergic reaction to doxorubicin , other medications, foods, dyes, or preservatives If you or your partner are pregnant or trying to get pregnant Breast-feeding How should I use this medication? This medication is injected into a vein. It is given by your care team in a hospital or clinic setting. Talk to  your care team about the use of this medication in children. Special care may be needed. Overdosage: If you think you have taken too much of this medicine contact a poison control center or emergency room at once. NOTE: This medicine is only for you. Do not share this medicine with others. What if I miss a dose? Keep appointments for follow-up doses. It is important not to miss your dose. Call your care team if you are unable to keep an appointment. What may interact with this medication? 6-mercaptopurine Paclitaxel Phenytoin St. John's wort Trastuzumab Verapamil This list may not describe all possible interactions. Give your health care provider a list of all the medicines, herbs, non-prescription drugs, or dietary supplements you use. Also tell them if you smoke, drink alcohol, or use illegal drugs. Some items may interact with your medicine. What should I watch for while using this medication? Your condition will be monitored carefully while you are receiving this medication. You may need blood work while taking this medication. This medication may make you feel generally unwell. This is not uncommon as chemotherapy can affect healthy cells as well as cancer cells. Report any side effects. Continue your course of treatment even though you feel ill unless your care team tells you to stop. There is a maximum amount of this medication you should receive throughout your life. The amount depends on the medical condition being treated and your overall health. Your care team will watch how much of this medication you receive. Tell your care team if you have taken this medication before. Your urine may turn red for a few days after your dose. This is not blood. If your urine is dark or brown, call your care team. In some cases, you may be given additional medications to help with side effects. Follow all directions for their use. This medication may increase your risk of getting an infection. Call your  care team for advice if you get a fever, chills, sore throat, or other symptoms of a cold or flu. Do not treat yourself. Try to avoid being around people who are sick. This medication may increase your risk to bruise or bleed. Call your care team if you notice any unusual bleeding. Talk to your care team about your risk of cancer. You may be more at risk for certain types of cancers if you take this medication. Talk to your care team if you or your partner may be pregnant. Serious birth defects can occur if you take this medication during pregnancy and for 6 months after the last dose. Contraception is recommended while taking this medication and for 6 months after the last dose. Your care team can help you find the option that works for you. If your partner can get pregnant, use a condom while taking this medication and for 6 months after the last dose. Do not breastfeed while taking this medication. This medication may cause infertility. Talk to your care team if you are concerned about your fertility. What side effects may I notice from receiving this medication? Side effects that you should report to your care team as soon as possible: Allergic reactions--skin rash, itching, hives, swelling of the face,  lips, tongue, or throat Heart failure--shortness of breath, swelling of the ankles, feet, or hands, sudden weight gain, unusual weakness or fatigue Heart rhythm changes--fast or irregular heartbeat, dizziness, feeling faint or lightheaded, chest pain, trouble breathing Infection--fever, chills, cough, sore throat, wounds that don't heal, pain or trouble when passing urine, general feeling of discomfort or being unwell Low red blood cell level--unusual weakness or fatigue, dizziness, headache, trouble breathing Painful swelling, warmth, or redness of the skin, blisters or sores at the infusion site Unusual bruising or bleeding Side effects that usually do not require medical attention (report to  your care team if they continue or are bothersome): Diarrhea Hair loss Nausea Pain, redness, or swelling with sores inside the mouth or throat Red urine This list may not describe all possible side effects. Call your doctor for medical advice about side effects. You may report side effects to FDA at 1-800-FDA-1088. Where should I keep my medication? This medication is given in a hospital or clinic. It will not be stored at home. NOTE: This sheet is a summary. It may not cover all possible information. If you have questions about this medicine, talk to your doctor, pharmacist, or health care provider.  2024 Elsevier/Gold Standard (2023-02-02 00:00:00)  Cyclophosphamide  Injection What is this medication? CYCLOPHOSPHAMIDE  (sye kloe FOSS fa mide) treats some types of cancer. It works by slowing down the growth of cancer cells. This medicine may be used for other purposes; ask your health care provider or pharmacist if you have questions. COMMON BRAND NAME(S): Cyclophosphamide , Cytoxan , Neosar  What should I tell my care team before I take this medication? They need to know if you have any of these conditions: Heart disease Irregular heartbeat or rhythm Infection Kidney problems Liver disease Low blood cell levels (white cells, platelets, or red blood cells) Lung disease Previous radiation Trouble passing urine An unusual or allergic reaction to cyclophosphamide , other medications, foods, dyes, or preservatives Pregnant or trying to get pregnant Breast-feeding How should I use this medication? This medication is injected into a vein. It is given by your care team in a hospital or clinic setting. Talk to your care team about the use of this medication in children. Special care may be needed. Overdosage: If you think you have taken too much of this medicine contact a poison control center or emergency room at once. NOTE: This medicine is only for you. Do not share this medicine with  others. What if I miss a dose? Keep appointments for follow-up doses. It is important not to miss your dose. Call your care team if you are unable to keep an appointment. What may interact with this medication? Amphotericin B Amiodarone Azathioprine Certain antivirals for HIV or hepatitis Certain medications for blood pressure, such as enalapril, lisinopril, quinapril Cyclosporine Diuretics Etanercept Indomethacin Medications that relax muscles Metronidazole  Natalizumab Tamoxifen Warfarin This list may not describe all possible interactions. Give your health care provider a list of all the medicines, herbs, non-prescription drugs, or dietary supplements you use. Also tell them if you smoke, drink alcohol, or use illegal drugs. Some items may interact with your medicine. What should I watch for while using this medication? This medication may make you feel generally unwell. This is not uncommon as chemotherapy can affect healthy cells as well as cancer cells. Report any side effects. Continue your course of treatment even though you feel ill unless your care team tells you to stop. You may need blood work while you are taking this medication. This medication  may increase your risk of getting an infection. Call your care team for advice if you get a fever, chills, sore throat, or other symptoms of a cold or flu. Do not treat yourself. Try to avoid being around people who are sick. Avoid taking medications that contain aspirin, acetaminophen , ibuprofen , naproxen , or ketoprofen unless instructed by your care team. These medications may hide a fever. Be careful brushing or flossing your teeth or using a toothpick because you may get an infection or bleed more easily. If you have any dental work done, tell your dentist you are receiving this medication. Drink water  or other fluids as directed. Urinate often, even at night. Some products may contain alcohol. Ask your care team if this medication  contains alcohol. Be sure to tell all care teams you are taking this medicine. Certain medicines, like metronidazole  and disulfiram, can cause an unpleasant reaction when taken with alcohol. The reaction includes flushing, headache, nausea, vomiting, sweating, and increased thirst. The reaction can last from 30 minutes to several hours. Talk to your care team if you wish to become pregnant or think you might be pregnant. This medication can cause serious birth defects if taken during pregnancy and for 1 year after the last dose. A negative pregnancy test is required before starting this medication. A reliable form of contraception is recommended while taking this medication and for 1 year after the last dose. Talk to your care team about reliable forms of contraception. Do not father a child while taking this medication and for 4 months after the last dose. Use a condom during this time period. Do not breast-feed while taking this medication or for 1 week after the last dose. This medication may cause infertility. Talk to your care team if you are concerned about your fertility. Talk to your care team about your risk of cancer. You may be more at risk for certain types of cancer if you take this medication. What side effects may I notice from receiving this medication? Side effects that you should report to your care team as soon as possible: Allergic reactions--skin rash, itching, hives, swelling of the face, lips, tongue, or throat Dry cough, shortness of breath or trouble breathing Heart failure--shortness of breath, swelling of the ankles, feet, or hands, sudden weight gain, unusual weakness or fatigue Heart muscle inflammation--unusual weakness or fatigue, shortness of breath, chest pain, fast or irregular heartbeat, dizziness, swelling of the ankles, feet, or hands Heart rhythm changes--fast or irregular heartbeat, dizziness, feeling faint or lightheaded, chest pain, trouble  breathing Infection--fever, chills, cough, sore throat, wounds that don't heal, pain or trouble when passing urine, general feeling of discomfort or being unwell Kidney injury--decrease in the amount of urine, swelling of the ankles, hands, or feet Liver injury--right upper belly pain, loss of appetite, nausea, light-colored stool, dark yellow or brown urine, yellowing skin or eyes, unusual weakness or fatigue Low red blood cell level--unusual weakness or fatigue, dizziness, headache, trouble breathing Low sodium level--muscle weakness, fatigue, dizziness, headache, confusion Red or dark brown urine Unusual bruising or bleeding Side effects that usually do not require medical attention (report to your care team if they continue or are bothersome): Hair loss Irregular menstrual cycles or spotting Loss of appetite Nausea Pain, redness, or swelling with sores inside the mouth or throat Vomiting This list may not describe all possible side effects. Call your doctor for medical advice about side effects. You may report side effects to FDA at 1-800-FDA-1088. Where should I keep my  medication? This medication is given in a hospital or clinic. It will not be stored at home. NOTE: This sheet is a summary. It may not cover all possible information. If you have questions about this medicine, talk to your doctor, pharmacist, or health care provider.  2024 Elsevier/Gold Standard (2022-03-18 00:00:00)

## 2024-11-20 NOTE — Assessment & Plan Note (Signed)
 She receives Day 3 long acting GCSF Neutropenia precaution.

## 2024-11-20 NOTE — Assessment & Plan Note (Signed)
 Poor dentition.  Encouraged patient to seek dental care. Add Peridex  oral solution twice daily Recommend local Orajel for pain control.  She may also use over-the-counter Tylenol  as needed. Questionable dental infection.  Given that she is immunocompromised, I recommend amoxicillin  500 mg twice daily for 5 days while waiting for dental evaluation.

## 2024-11-20 NOTE — Assessment & Plan Note (Signed)
 Continue Lovenox 1 mg/kg twice daily

## 2024-11-20 NOTE — Assessment & Plan Note (Signed)
 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 mT1c [id number] Initial margin was positive and patient underwent reexcision and achieved a negative margin.  Recommend  adjuvant ddAC followed by weekly Taxol. Baseline Echo LVEF 60-65%.Rationale and side effects were reviewed with patient.  Labs are reviewed and discussed with patient. Proceed with cycle 2 ddAC with D3 GCSF.  Supportive care.

## 2024-11-20 NOTE — Assessment & Plan Note (Signed)
 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  Per IR, right inguinal lymph node is not enlarged.  Previously on concurrent chemotherapy 5-FU and mitomycin  C with Radiation. S/p D1 5-FU and Mitomycin -C. D29 5-FU infusion was switched to concurrent Xeloda  due to lack of medi port.- she has picc line. Received D29 Mitomycin  C. Chemo was interrupted due to multi focal pneumonia.  S/p concurrent Xeloda  with Radiation [last RT 09/24/2024 .   Future plan - DRE 8-12 weeks after treatment - Jan 2026. I will re image after she finishes adjuvant breast chemotherapy

## 2024-11-20 NOTE — Assessment & Plan Note (Signed)
 Continue home antiemetics phernegan and compazine  PRN.  Added lorazepam  0.5 every 12 hours as needed anxiety and nausea.

## 2024-11-20 NOTE — Progress Notes (Signed)
 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 mT1c pN51mi Initial margin was positive and patient underwent reexcision and achieved a negative margin.  Recommend  adjuvant ddAC followed by weekly Taxol. Baseline Echo LVEF 60-65%.Rationale and side effects were reviewed with patient.  Labs are reviewed and discussed with patient. Proceed with cycle 2 ddAC with D3 GCSF.  Supportive care.   Acute subsegmental pulmonary embolism (HCC) Continue Lovenox  1 mg/kg twice daily.  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  Per IR, right inguinal lymph node is not enlarged.  Previously on concurrent chemotherapy 5-FU and mitomycin  C with Radiation. S/p D1 5-FU and Mitomycin -C. D29 5-FU infusion was switched to concurrent Xeloda  due to lack of medi port.- she has picc line. Received D29 Mitomycin  C. Chemo was interrupted due to multi focal pneumonia.  S/p concurrent Xeloda  with Radiation [last RT 09/24/2024 .   Future plan - DRE 8-12 weeks after treatment - Jan 2026. I will re image after she finishes adjuvant breast chemotherapy  Chemotherapy induced neutropenia She receives Day 3 long acting GCSF Neutropenia precaution.   Chemotherapy-induced nausea Continue home antiemetics phernegan and compazine  PRN.  Added lorazepam  0.5 every 12 hours as needed anxiety and nausea.     Pain, dental Poor dentition.  Encouraged patient to seek dental care. Add Peridex  oral solution twice daily Recommend local Orajel for pain control.  She may also use over-the-counter Tylenol  as needed. Questionable dental  infection.  Given that she is immunocompromised, I recommend amoxicillin  500 mg twice daily for 5 days while waiting for dental evaluation.  Poor appetite Discussed about option of adding Megace  80 mg twice daily. Rationale and potential side effects reviewed and discussed with patient.  She agrees.  Prescription was sent to pharmacy.       Orders Placed This Encounter  Procedures   CMP (Cancer Center only)    Standing Status:   Future    Expected Date:   11/27/2024    Expiration Date:   02/25/2025   CBC with Differential (Cancer Center Only)    Standing Status:   Future    Expected Date:   11/27/2024    Expiration Date:   02/25/2025   CBC with Differential (Cancer Center Only)    Standing Status:   Future    Expected Date:   12/04/2024    Expiration Date:   12/04/2025   CMP (Cancer Center only)    Standing Status:   Future    Expected Date:   12/04/2024    Expiration Date:   12/04/2025   Follow-up per los All questions were answered. The patient knows to call the clinic with any problems, questions or concerns.  [name], MD, PhD [facility] Hematology Oncology 11/20/2024    HISTORY OF PRESENTING ILLNESS:  Monica Gonzalez 49 y.o. 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 Summary of oncologic history is as follows: Oncology History  Anal squamous cell carcinoma (HCC)  05/29/2024 Initial Diagnosis   Anal squamous cell  carcinoma   Patient has noticed a hard mass at the anal area which she initially thought was a hemorrhoid.  The mass has been present for more than a month and appears to be enlarging. Patient was referred to establish care with surgeon Dr. Tye  05/16/2024, incisional biopsy of anal mass showed 1. Anus, biopsy, mass :       - INVASIVE MODERATELY DIFFERENTIATED SQUAMOUS CELL  CARCINOMA  06/19/2024, left inguinal adenopathy biopsy showed Metastatic moderately differentiated squamous cell carcinoma.  '    05/29/2024 Cancer Staging   Staging form: Anus, AJCC V9 - Clinical stage from 05/29/2024: Stage IIIA (cT3, cN1a, cM0) - Signed by Babara Call, MD on 06/26/2024 Stage prefix: Initial diagnosis   06/05/2024 Imaging   CT chest abdomen pelvis with contrast  Slight thickening in the area of the anal canal. Please correlate for history of neoplasm. There is a pathologic enlarged left inguinal node. There is a borderline right-sided node.   No additional areas of abnormal nodal enlargement or other aggressive appearing mass lesion at this time.   Fatty liver infiltration with known segment 7 hepatic hemangioma.   Of note there is a right-sided upper breast mass which was not clearly seen on prior CT scan. Please correlate for any prior study or if needed diagnostic mammographic evaluation and possible ultrasound when appropriate.     06/12/2024 Imaging   CT chest abdomen pelvis with contrast showed Slight thickening in the area of the anal canal. Please correlate for history of neoplasm. There is a pathologic enlarged left inguinal node. There is a borderline right-sided node.   No additional areas of abnormal nodal enlargement or other aggressive appearing mass lesion at this time.   Fatty liver infiltration with known segment 7 hepatic hemangioma.   Of note there is a right-sided upper breast mass which was not clearly seen on prior CT scan. Please correlate for any prior study or if needed diagnostic mammographic evaluation and possible ultrasound when appropriate.   06/19/2024 Procedure   1. Lymph node, biopsy, left inguinal adenopathy :       -  METASTATIC MODERATELY DIFFERENTIATED SQUAMOUS CELL CARCINOMA    07/01/2024 Imaging   PET scan showed 1. Hypermetabolic anal mass with hypermetabolic left inguinal adenopathy, compatible with anorectal  carcinoma. 2. Hypermetabolic right breast nodule with recent biopsies on 06/18/2024. Please correlate with pathology report.   07/31/2024 - 07/31/2024 Chemotherapy   Patient is on Treatment Plan : ANUS Mitomycin  D1,28 + 5FU D1-4, 28-31 q32d     08/23/2024 Genetic Testing   Negative genetic testing. No pathogenic variants identified on the [facility] CancerNext-Expanded+RNA Panel. The report date is 08/23/2024.  The CancerNext-Expanded gene panel offered by [facility] and includes sequencing, rearrangement, and RNA analysis for the following 77 genes: AIP, ALK, APC, ATM, AXIN2, BAP1, BARD1, BMPR1A, BRCA1, BRCA2, BRIP1, CDC73, CDH1, CDK4, CDKN1B, CDKN2A, CEBPA, CHEK2, CTNNA1, DDX41, DICER1, ETV6, FH, FLCN, GATA2, LZTR1, MAX, MBD4, MEN1, MET, MLH1, MSH2, MSH3, MSH6, MUTYH, NF1, NF2, NTHL1, PALB2, PHOX2B, PMS2, POT1, PRKAR1A, PTCH1, PTEN, RAD51C, RAD51D, RB1, RET, RPS20, RUNX1, SDHA, SDHAF2, SDHB, SDHC, SDHD, SMAD4, SMARCA4, SMARCB1, SMARCE1, STK11, SUFU, TMEM127, TP53, TSC1, TSC2, VHL, and WT1 (sequencing and deletion/duplication); EGFR, HOXB13, KIT, MITF, PDGFRA, POLD1, and POLE (sequencing only); EPCAM and GREM1 (deletion/duplication only).    08/29/2024 - 08/29/2024 Chemotherapy   Patient is on Treatment Plan : ANUS Mitomycin  D1,29 + Capecitabine  + XRT     Invasive carcinoma of breast (HCC)  06/12/2024 Imaging  CT chest abdomen pelvis with contrast showed Slight thickening in the area of the anal canal. Please correlate for history of neoplasm. There is a pathologic enlarged left inguinal node. There is a borderline right-sided node.   No additional areas of abnormal nodal enlargement or other aggressive appearing mass lesion at this time.   Fatty liver infiltration with known segment 7 hepatic hemangioma.   Of note there is a right-sided upper breast mass which was not clearly seen on prior CT scan. Please correlate for any prior study or if needed diagnostic mammographic evaluation and  possible ultrasound when appropriate.   06/17/2024 Mammogram   1. There is a suspicious 17 mm mass at the site of CT and mammographic concern in the RIGHT breast. It demonstrates associated pleomorphic calcifications. Recommend ultrasound-guided biopsy for definitive characterization. 2. There is incidental sonographic note of a 7 mm mass in the RIGHT breast at 12 o'clock and a 6 mm non mass area at 9 o'clock. Recommend ultrasound-guided biopsy of these 2 areas for definitive characterization given suspicious appearance of the dominant mass. 3. No suspicious RIGHT axillary adenopathy. 4. No mammographic evidence of malignancy in the LEFT breast.   06/18/2024 Cancer Staging   Staging form: Breast, AJCC 8th Edition - Clinical stage from 06/18/2024: Stage IB (cT1b, cN0, cM0, G3, ER-, PR-, HER2-) - Signed by Babara Call, MD on 06/26/2024 Stage prefix: Initial diagnosis Histologic grading system: 3 grade system   06/18/2024 Initial Diagnosis   Invasive carcinoma of breast [facility])  Patient with right breast biopsy.  Pathology showed 1. Breast, right, needle core biopsy, 12:00 12cmfn (heart clip) :      INVASIVE DUCTAL CARCINOMA      TUBULE FORMATION: SCORE 3      NUCLEAR PLEOMORPHISM: SCORE 3      MITOTIC COUNT: SCORE 2      TOTAL SCORE: 8      OVERALL GRADE: 3      LYMPHOVASCULAR INVASION: NOT IDENTIFIED      CANCER LENGTH: 1.0 CM      CALCIFICATIONS: NOT IDENTIFIED      OTHER FINDINGS: NONE      ER negative, PR negative, HER2 negative (IHC 0) Ki-67 95%.       2. Breast, right, needle core biopsy, 12:00 8cmfn (coil clip) :      INVASIVE DUCTAL CARCINOMA      DUCTAL CARCINOMA IN SITU, SOLID, HIGH NUCLEAR GRADE WITH NECROSIS      TUBULE FORMATION: SCORE 3      NUCLEAR PLEOMORPHISM: SCORE 3      MITOTIC COUNT: SCORE 2      TOTAL SCORE: 8      OVERALL GRADE: 3      LYMPHOVASCULAR INVASION: NOT IDENTIFIED      CANCER LENGTH: 0.3 CM      CALCIFICATIONS: NOT IDENTIFIED      OTHER FINDINGS:  NONE            3. Breast, right, needle core biopsy, 9:00 12cmfn (venus) :      BENIGN BREAST TISSUE WITH DENSE STROMAL FIBROSIS.      NEGATIVE FOR ATYPIA OR MALIGNANCY.    07/08/2024 Surgery   Patient went right breast lumpectomy and sentinel lymph node biopsy.  1. 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      - DUCTAL CARCINOMA IN SITU (DCIS).      - SEE CANCER SUMMARY AND NOTE  BELOW.      - TWO BIOPSY SITES WITH CORRESPONDING HEART AND COIL CLIPS.      - TWO SAVI SCOUT TAGS.       2. Lymph node, sentinel, biopsy, Right :      - MICROMETASTATIC CARCINOMA INVOLVES ONE OF FIVE LYMPH NODES (1MI/5).      - SEE CANCER SUMMARY AND NOTE BELOW.       3. Breast, excision, Right medial posterior margin :      - BENIGN BREAST TISSUE.      - NEGATIVE FOR ATYPIA AND MALIGNANCY.      - SEE CANCER SUMMARY BELOW.   TUMOR Histologic Type: Invasive carcinoma of no special type (ductal) Histologic Grade (Nottingham Histologic Score) Glandular (Acinar)/Tubular Differentiation: 3 Nuclear Pleomorphism: 3 Mitotic Rate: 3 Overall Grade: 3 Tumor Size: Greatest dimension of largest invasive focus: 19 mm Ductal Carcinoma In Situ (DCIS): Present, high-grade with comedonecrosis Lymphatic and/or Vascular Invasion: Present, extensive (LVI in 2 or more blocks)  Treatment Effect in the Breast: No known presurgical therapy MARGINS Margin Status for Invasive Carcinoma: Invasive Carcinoma involves inferior (unifocal) and superior (unifocal) margins and is 0.5 mm to anterior margin Margin Status for DCIS: All margins negative for DCIS Distance from DCIS to closest margin: 0.5 mm Specify closest margin: Inferior REGIONAL LYMPH NODES Regional Lymph Node Status: Tumor present in regional lymph node(s) Number of Lymph Nodes with Macrometastases (greater than 2 mm): 0 Number of Lymph Nodes with Micrometastases (greater than 0.2 mm to 2 mm  and/or greater than 200 cells): 1 Number of Lymph Nodes with Isolated Tumor Cells (0.2 mm or less OR 200 cells or less): 0 Size of Largest Metastatic Deposit: 1.9 mm Extranodal Extension: Not identified Total Number of Lymph Nodes Examined (sentinel and non-sentinel): 5 Number of Sentinel Nodes Examined: 5 DISTANT METASTASIS Distant Site(s) Involved, if applicable: Not applicable PATHOLOGIC STAGE CLASSIFICATION (pTNM, AJCC 8th Edition): Modified Classification: Not applicable pT Category: pT1c T Suffix: (m) multiple primary synchronous tumors in a single organ pN Category: pN57mi N Suffix: (sn) pM Category: Not applicable SPECIAL STUDIES Breast Biomarker Testing Performed on Previous Biopsy: [id number] (heart) Estrogen Receptor: Negative (0%) Progesterone Receptor: Negative (0%) HER2 IHC: Negative (0) Proliferation Marker Ki67: 95%   07/22/2024 Cancer Staging   Staging form: Breast, AJCC 8th Edition - Pathologic stage from 07/22/2024: Stage IB (pT1c, pN57mi(sn), cM0, G3, ER-, PR-, HER2-) - Signed by Babara Call, MD on 07/22/2024 Stage prefix: Initial diagnosis Method of lymph node assessment: Sentinel lymph node biopsy Multigene prognostic tests performed: None Histologic grading system: 3 grade system   07/25/2024 Surgery   Patient reports additional excision for positive margin.  1. Breast, excision, superior margin of right breast - RESIDUAL INVASIVE DUCTAL CARCINOMA, COMPLETELY EXCISED. - CARCINOMA PRESENT 2 MM FROM THE POSTERIOR MARGIN AND 4 MM FROM THE ANTERIOR AND SUPERIOR MARGINS. 2. Breast, excision, inferior margin of right breast - RESIDUAL INVASIVE DUCTAL CARCINOMA, COMPLETELY EXCISED. - CARCINOMA PRESENT 1 MM FROM THE NEW INFERIOR MARGIN.   08/23/2024 Genetic Testing   Negative genetic testing. No pathogenic variants identified on the [facility] CancerNext-Expanded+RNA Panel. The report date is 08/23/2024.  The CancerNext-Expanded gene panel offered by [facility] and  includes sequencing, rearrangement, and RNA analysis for the following 77 genes: AIP, ALK, APC, ATM, AXIN2, BAP1, BARD1, BMPR1A, BRCA1, BRCA2, BRIP1, CDC73, CDH1, CDK4, CDKN1B, CDKN2A, CEBPA, CHEK2, CTNNA1, DDX41, DICER1, ETV6, FH, FLCN, GATA2, LZTR1, MAX, MBD4, MEN1, MET, MLH1, MSH2, MSH3, MSH6, MUTYH, NF1, NF2, NTHL1, PALB2,  PHOX2B, PMS2, POT1, PRKAR1A, PTCH1, PTEN, RAD51C, RAD51D, RB1, RET, RPS20, RUNX1, SDHA, SDHAF2, SDHB, SDHC, SDHD, SMAD4, SMARCA4, SMARCB1, SMARCE1, STK11, SUFU, TMEM127, TP53, TSC1, TSC2, VHL, and WT1 (sequencing and deletion/duplication); EGFR, HOXB13, KIT, MITF, PDGFRA, POLD1, and POLE (sequencing only); EPCAM and GREM1 (deletion/duplication only).    10/23/2024 -  Chemotherapy   Patient is on Treatment Plan : BREAST DOSE DENSE AC q14d / PACLitaxel q7d      Discussed the use of AI scribe software for clinical note transcription with the patient, who gave verbal consent to proceed.   Patient has a history of VIN 3 status post vulvectomy  07/11/2024,she was evaluated by cardiology with palpitations and atypical chest pain. Chest pain deemed atypical and she went on to have a normal echo at 07/19/2024.   07/23/2024, patient was seen by symptom management for right neck pain.  She was seen by Tinnie Dawn.  Right upper extremity ultrasound was obtained which showed acute occlusive DVT involving the right internal jugular and subclavian veins.  Patient was started on Lovenox , she was supposed to be started on 1 mg/kg twice daily however was started off 40 mg twice daily which is a subtherapeutic dosage. She was asked to hold off the evening dose prior to her breast re-excision.  07/29/2024, patient presented emergency room due to shortness of breath and chest pain.  CTA angiogram showed subsegmental PE medially in the left lower lobe.  Shortness of breath was felt to be multifactorial secondary to asthma/bronchitis, pulmonary embolism.  Symptoms are better after supportive care and she  was discharged on Lovenox  80 mg subcutaneously twice daily.  08/31/2024 -09/06/2024 hospitalized due to sepsis due to multifocal pneumonia, UTI. Respiratory viral panel positive for metapneumovirus  Patient resumed on radiation on 09/16/24.  Patient took Xeloda  twice daily and continued radiation till 09/19/2024.  She was not able to continue radiation on 09/20/2024 due to severe diarrhea.  11/7 radiation was not given due to patient's symptoms Final radiation was done on 09/25/2024. Last dose of Xeloda  was omitted.     INTERVAL HISTORY Monica Gonzalez is a 49 y.o. female who has above history reviewed by me today presents for follow up visit prior to chemo  Discussed the use of AI scribe software for clinical note transcription with the patient, who gave verbal consent to proceed.   Patient continues to have poor appetite.  Weight has been stable.  Gained 1 pound. SHe was seen by nutritionist who recommend trials of Megace .  Patient remains on Lovenox  injection for anticoagulation.  Patient endorses persistent pre-existing peripheral neuropathy, characterized by burning itching and numbness in her feet.  Symptoms are worse at night.  She previously take gabapentin  300 mg twice daily.  Patient has a history of cervical disc disease with radiculopathy.  Patient describes severe dental pain involving broken front teeth requiring extraction and discomfort from denture.  She is planning to establish care with dentist.  Today patient denies diarrhea or vomiting.  Mild nausea.  No new acute issues.  MEDICAL HISTORY:  Past Medical History:  Diagnosis Date   Anemia    Anginal pain    Anxiety    Arthritis    Asthma    Cancer (HCC)    rectal cancer, breast cancer 2025   Chronic pain syndrome 05/21/2023   DDD (degenerative disc disease), cervical 05/22/2023   Deep venous thrombosis (HCC)    Depression    Dyspnea    Fibromyalgia    GERD (gastroesophageal reflux disease)  Headache    Heart  murmur    History of hiatal hernia    History of kidney stones    Lupus    Malignant neoplasm of upper-outer quadrant of right female breast, unspecified estrogen receptor status (HCC) 2025   Pneumonia    PTSD (post-traumatic stress disorder)    Skin candidiasis 05/29/2024   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    SURGICAL HISTORY: Past Surgical History:  Procedure Laterality Date   BREAST BIOPSY Right 06/18/2024   US  RT BREAST BX W LOC DEV 1ST LESION IMG BX SPEC US  GUIDE 06/18/2024 [facility]   BREAST BIOPSY Right 06/18/2024   US  RT BREAST BX W LOC DEV EA ADD LESION IMG BX SPEC US  GUIDE 06/18/2024 [facility]   BREAST BIOPSY Right 06/18/2024   US  RT BREAST BX W LOC DEV EA ADD LESION IMG BX SPEC US  GUIDE 06/18/2024 [facility]   BREAST BIOPSY Right 07/05/2024   US  RT BREAST SAVI/RF TAG 1ST LESION US  GUIDE 07/05/2024 [facility]   BREAST BIOPSY Right 07/05/2024   US  RT BREAST SAVI/RF TAG EA ADD'L LESION US  GUIDE 07/05/2024 [facility]   BREAST LUMPECTOMY Right 07/25/2024   Procedure: BREAST LUMPECTOMY;  Surgeon: Tye Millet, DO;  Location: [facility];  Service: General;  Laterality: Right;   BREAST LUMPECTOMY WITH RADIO FREQUENCY LOCALIZER Right 07/08/2024   Procedure: BREAST LUMPECTOMY WITH RADIO FREQUENCY LOCALIZER;  Surgeon: Tye Millet, DO;  Location: [facility];  Service: General;  Laterality: Right;   COLONOSCOPY WITH PROPOFOL  N/A 02/17/2023   Procedure: COLONOSCOPY WITH PROPOFOL ;  Surgeon: Maryruth Ole DASEN, MD;  Location: [facility];  Service: Endoscopy;  Laterality: N/A;   ESOPHAGOGASTRODUODENOSCOPY (EGD) WITH PROPOFOL  N/A 02/17/2023   Procedure: ESOPHAGOGASTRODUODENOSCOPY (EGD) WITH PROPOFOL ;  Surgeon: Maryruth Ole DASEN, MD;  Location: [facility];  Service: Endoscopy;  Laterality: N/A;   EXTRACORPOREAL SHOCK WAVE LITHOTRIPSY     IR CV LINE INJECTION  08/21/2024   IR REMOVAL TUN ACCESS W/ PORT W/O FL MOD SED  08/28/2024    PICC LINE INSERTION N/A 08/2024   PORTA CATH INSERTION N/A 10/14/2024   Procedure: PORTA CATH INSERTION;  Surgeon: Marea Selinda RAMAN, MD;  Location: [facility];  Service: Cardiovascular;  Laterality: N/A;   PORTA CATH REMOVAL N/A 10/14/2024   Procedure: PORTA CATH REMOVAL;  Surgeon: Marea Selinda RAMAN, MD;  Location: [facility];  Service: Cardiovascular;  Laterality: N/A;   PORTACATH PLACEMENT Right 07/08/2024   Procedure: INSERTION, TUNNELED CENTRAL VENOUS DEVICE, WITH PORT;  Surgeon: Tye Millet, DO;  Location: [facility];  Service: General;  Laterality: Right;   RECTAL EXAM UNDER ANESTHESIA N/A 05/16/2024   Procedure: EXAM UNDER ANESTHESIA, RECTUM, INCISIONAL BIOPSY OF ANAL MASS;  Surgeon: Tye Millet, DO;  Location: [facility];  Service: General;  Laterality: N/A;   SENTINEL NODE BIOPSY Right 07/08/2024   Procedure: BIOPSY, LYMPH NODE, SENTINEL;  Surgeon: Tye Millet, DO;  Location: [facility];  Service: General;  Laterality: Right;   TONSILLECTOMY     TUBAL LIGATION     VULVECTOMY N/A 07/29/2020   Procedure: WIDE EXCISION VULVECTOMY;  Surgeon: Mancil Barter, MD;  Location: [facility];  Service: Gynecology;  Laterality: N/A;    SOCIAL HISTORY: Social History   Socioeconomic History   Marital status: Divorced    Spouse name: Not on file   Number of children: 1   Years of education: Not on file   Highest education level: Not on file  Occupational History   Not on file  Tobacco Use   Smoking status: Former    Types: Cigarettes   Smokeless tobacco: Never  Vaping Use   Vaping status: Never Used  Substance and Sexual Activity   Alcohol use: Not Currently   Drug use: No   Sexual activity: Yes  Other Topics Concern   Not on file  Social History Narrative   Not on file   Social Drivers of Health   Tobacco Use: Medium Risk (11/20/2024)   Patient History    Smoking Tobacco Use: Former    Smokeless Tobacco Use: Never    Passive Exposure: Not on file  Financial Resource  Strain: Medium Risk (05/15/2024)   Received from [facility]   Overall Financial Resource Strain (CARDIA)    Difficulty of Paying Living Expenses: Somewhat hard  Food Insecurity: No Food Insecurity (09/07/2024)   Epic    Worried About Programme Researcher, Broadcasting/film/video in the Last Year: Never true    Ran Out of Food in the Last Year: Never true  Recent Concern: Food Insecurity - Food Insecurity Present (08/22/2024)   Epic    Worried About Programme Researcher, Broadcasting/film/video in the Last Year: Sometimes true    Ran Out of Food in the Last Year: Sometimes true  Transportation Needs: No Transportation Needs (09/07/2024)   Epic    Lack of Transportation (Medical): No    Lack of Transportation (Non-Medical): No  Physical Activity: Not on file  Stress: Not on file  Social Connections: Not on file  Intimate Partner Violence: Not At Risk (09/07/2024)   Epic    Fear of Current or Ex-Partner: No    Emotionally Abused: No    Physically Abused: No    Sexually Abused: No  Depression (PHQ2-9): Low Risk (11/20/2024)   Depression (PHQ2-9)    PHQ-2 Score: 0  Alcohol Screen: Low Risk (10/16/2024)   Alcohol Screen    Last Alcohol Screening Score (AUDIT): 0  Housing: Low Risk (10/16/2024)   Epic    Unable to Pay for Housing in the Last Year: No    Number of Times Moved in the Last Year: 1    Homeless in the Last Year: No  Utilities: Not At Risk (09/07/2024)   Epic    Threatened with loss of utilities: No  Health Literacy: Not on file    FAMILY HISTORY: Family History  Problem Relation Age of Onset   Diabetes Mother    Congestive Heart Failure Mother    Colon cancer Father 33   Lupus Maternal Grandmother    Cancer Maternal Grandfather        either prostate or colon   Colon cancer Paternal Grandfather    Lung cancer Paternal Grandfather     ALLERGIES:  is allergic to prednisone and silicone.  MEDICATIONS:  Current Outpatient Medications  Medication Sig Dispense Refill   acetaminophen  (TYLENOL ) 500  MG tablet Take 500 mg by mouth every 6 (six) hours as needed.     albuterol  (VENTOLIN  HFA) 108 (90 Base) MCG/ACT inhaler Inhale 2 puffs into the lungs every 4 (four) hours as needed for wheezing or shortness of breath. 8.5 g 2   amoxicillin  (AMOXIL ) 500 MG capsule Take 1 capsule (500 mg total) by mouth 2 (two) times daily. 10 capsule 0   ascorbic acid (VITAMIN C) 500 MG tablet Take 500 mg by mouth daily.     azelastine  (ASTELIN ) 0.1 % nasal spray Place 1 spray into both nostrils 2 (two) times daily. Use in each nostril  as directed 30 mL 0   benzonatate  (TESSALON ) 200 MG capsule Take 1 capsule (200 mg total) by mouth 3 (three) times daily as needed for cough. 30 capsule 1   BIOTIN PO Take by mouth daily.     budesonide  (PULMICORT ) 0.25 MG/2ML nebulizer solution Take 2 mLs (0.25 mg total) by nebulization 2 (two) times daily. 60 mL 12   busPIRone  (BUSPAR ) 5 MG tablet Take 5 mg by mouth 2 (two) times daily.     chlorhexidine  (PERIDEX ) 0.12 % solution Use as directed 10 mLs in the mouth or throat 2 (two) times daily. 473 mL 0   cholecalciferol (VITAMIN D3) 25 MCG (1000 UNIT) tablet Take 1,000 Units by mouth daily.     dexamethasone  (DECADRON ) 4 MG tablet Take 2 tablets (8 mg total) by mouth daily for 2 days. Start the day after doxorubicin /cyclophosphamide  chemotherapy. Take with food. 30 tablet 1   diphenoxylate -atropine  (LOMOTIL ) 2.5-0.025 MG tablet Take 1 tablet by mouth 4 (four) times daily. 90 tablet 0   DULoxetine  (CYMBALTA ) 30 MG capsule Take 30 mg by mouth at bedtime.     enoxaparin  (LOVENOX ) 80 MG/0.8ML injection Inject 0.8 mLs (80 mg total) into the skin every 12 (twelve) hours. 48 mL 2   esomeprazole  (NEXIUM ) 40 MG capsule Take 1 capsule (40 mg total) by mouth 2 (two) times daily before a meal. 60 capsule 2   ibuprofen  (ADVIL ) 200 MG tablet Take 200 mg by mouth every 6 (six) hours as needed.     ipratropium-albuterol  (DUONEB) 0.5-2.5 (3) MG/3ML SOLN Take 3 mLs by nebulization 4 (four) times  daily. 360 mL 3   lactose free nutrition (BOOST PLUS) LIQD Take 237 mLs by mouth 2 (two) times daily between meals. 237 mL 11   lidocaine -prilocaine  (EMLA ) cream Apply 1 Application topically as needed. 30 g 3   linaclotide (LINZESS) 145 MCG CAPS capsule Take 145 mcg by mouth daily before breakfast.     LORazepam  (ATIVAN ) 0.5 MG tablet Take 1 tablet (0.5 mg total) by mouth every 12 (twelve) hours as needed for anxiety (nausea). 30 tablet 0   megestrol  (MEGACE ) 40 MG tablet Take 2 tablets (80 mg total) by mouth 2 (two) times daily. 120 tablet 0   montelukast  (SINGULAIR ) 10 MG tablet Take 1 tablet (10 mg total) by mouth See admin instructions. Take 1 tablet on the day prior to chemotherapy and take 1 tablet daily for 2 days after chemotherapy. 30 tablet 0   Multiple Vitamin (MULTIVITAMIN WITH MINERALS) TABS tablet Take 1 tablet by mouth daily.     nystatin  (MYCOSTATIN ) 100000 UNIT/ML suspension Take 5 mLs (500,000 Units total) by mouth 4 (four) times daily. Swish and swallow 473 mL 1   oxyCODONE  (OXY IR/ROXICODONE ) 5 MG immediate release tablet Take 1 tablet (5 mg total) by mouth every 6 (six) hours as needed for severe pain (pain score 7-10) or breakthrough pain. 15 tablet 0   potassium chloride  SA (KLOR-CON  M) 20 MEQ tablet Take 1 tablet (20 mEq total) by mouth 2 (two) times daily. 60 tablet 0   prochlorperazine  (COMPAZINE ) 10 MG tablet Take 1 tablet (10 mg total) by mouth every 6 (six) hours as needed for nausea or vomiting. 30 tablet 1   promethazine  (PHENERGAN ) 25 MG tablet Take 1 tablet (25 mg total) by mouth every 8 (eight) hours as needed for nausea or vomiting. 60 tablet 0   traZODone  (DESYREL ) 50 MG tablet Take 1 tablet (50 mg total) by mouth at bedtime. 30 tablet  2   gabapentin  (NEURONTIN ) 300 MG capsule Take 2 capsules (600 mg total) by mouth 3 (three) times daily. 180 capsule 2   naloxone  (NARCAN ) nasal spray 4 mg/0.1 mL SPRAY 1 SPRAY INTO ONE NOSTRIL AS DIRECTED FOR OPIOID OVERDOSE (TURN  PERSON ON SIDE AFTER DOSE. IF NO RESPONSE IN 2-3 MINUTES OR PERSON RESPONDS BUT RELAPSES, REPEAT USING A NEW SPRAY DEVICE AND SPRAY INTO THE OTHER NOSTRIL. CALL 911 AFTER USE.) * EMERGENCY USE ONLY * (Patient not taking: Reported on 11/20/2024) 1 each 0   Turmeric (QC TUMERIC COMPLEX PO) Take by mouth daily. (Patient not taking: Reported on 11/20/2024)     No current facility-administered medications for this visit.   Facility-Administered Medications Ordered in Other Visits  Medication Dose Route Frequency Provider Last Rate Last Admin   0.9 %  sodium chloride  infusion   Intravenous Continuous Babara Call, MD   Stopped at 11/20/24 1435    Review of Systems  Constitutional:  Positive for appetite change and fatigue. Negative for chills and fever.  HENT:   Negative for hearing loss and voice change.        Tooth pain  Eyes:  Negative for eye problems.  Respiratory:  Negative for chest tightness, cough and shortness of breath.   Cardiovascular:  Negative for chest pain.  Gastrointestinal:  Positive for nausea. Negative for abdominal distention, abdominal pain, blood in stool, diarrhea and vomiting.  Endocrine: Negative for hot flashes.  Genitourinary:  Negative for difficulty urinating, dysuria and frequency.   Musculoskeletal:  Negative for arthralgias.  Skin:  Negative for itching and rash.  Neurological:  Negative for extremity weakness.  Hematological:  Negative for adenopathy.  Psychiatric/Behavioral:  Negative for confusion.      PHYSICAL EXAMINATION: ECOG PERFORMANCE STATUS: 0 - Asymptomatic  Vitals:   11/20/24 1127  BP: 97/71  Pulse: 93  Resp: 18  Temp: (!) 97.2 F (36.2 C)  SpO2: 99%   Filed Weights   11/20/24 1127  Weight: 178 lb 12.8 oz (81.1 kg)    Physical Exam Constitutional:      General: She is not in acute distress.    Appearance: She is not diaphoretic.  HENT:     Head: Normocephalic and atraumatic.     Mouth/Throat:     Pharynx: No oropharyngeal exudate.   Eyes:     General: No scleral icterus. Cardiovascular:     Rate and Rhythm: Normal rate and regular rhythm.     Heart sounds: No murmur heard. Pulmonary:     Effort: Pulmonary effort is normal. No respiratory distress.     Breath sounds: No wheezing.  Abdominal:     General: There is no distension.     Palpations: Abdomen is soft.     Tenderness: There is no abdominal tenderness.  Musculoskeletal:        General: Normal range of motion.     Cervical back: Normal range of motion and neck supple.  Skin:    General: Skin is warm and dry.     Findings: No erythema.     Comments: Left chest wall medi port, mild erythematous changes at the incision site.   Neurological:     Mental Status: She is alert and oriented to person, place, and time. Mental status is at baseline.     Motor: No abnormal muscle tone.  Psychiatric:        Mood and Affect: Affect normal.         LABORATORY DATA:  I  have reviewed the data as listed    Latest Ref Rng & Units 11/20/2024   11:14 AM 11/11/2024    1:26 PM 10/30/2024    8:37 AM  CBC  WBC 4.0 - 10.5 K/uL 4.4  7.0  1.1   Hemoglobin 12.0 - 15.0 g/dL 88.8  87.6  89.6   Hematocrit 36.0 - 46.0 % 35.0  37.7  32.3   Platelets 150 - 400 K/uL 310  503  110       Latest Ref Rng & Units 11/20/2024   11:14 AM 11/11/2024    1:26 PM 10/30/2024    8:37 AM  CMP  Glucose 70 - 99 mg/dL 866  852  859   BUN 6 - 20 mg/dL 11  <5  11   Creatinine 0.44 - 1.00 mg/dL 9.18  9.03  9.29   Sodium 135 - 145 mmol/L 141  141  137   Potassium 3.5 - 5.1 mmol/L 4.0  3.7  4.2   Chloride 98 - 111 mmol/L 107  103  103   CO2 22 - 32 mmol/L 22  23  24    Calcium 8.9 - 10.3 mg/dL 9.1  9.6  9.1   Total Protein 6.5 - 8.1 g/dL 6.1  6.7  6.0   Total Bilirubin 0.0 - 1.2 mg/dL 0.3  0.4  0.6   Alkaline Phos 38 - 126 U/L 85  83  92   AST 15 - 41 U/L 44  35  37   ALT 0 - 44 U/L 38  26  34      RADIOGRAPHIC STUDIES: I have personally reviewed the radiological images as listed and  agreed with the findings in the report. No results found.   "

## 2024-11-21 ENCOUNTER — Encounter: Payer: Self-pay | Admitting: Oncology

## 2024-11-22 ENCOUNTER — Inpatient Hospital Stay

## 2024-11-22 DIAGNOSIS — C50919 Malignant neoplasm of unspecified site of unspecified female breast: Secondary | ICD-10-CM

## 2024-11-22 DIAGNOSIS — C210 Malignant neoplasm of anus, unspecified: Secondary | ICD-10-CM | POA: Diagnosis not present

## 2024-11-22 MED ORDER — PEGFILGRASTIM-CBQV 6 MG/0.6ML ~~LOC~~ SOSY
6.0000 mg | PREFILLED_SYRINGE | Freq: Once | SUBCUTANEOUS | Status: AC
  Administered 2024-11-22: 6 mg via SUBCUTANEOUS
  Filled 2024-11-22: qty 0.6

## 2024-11-27 ENCOUNTER — Telehealth: Payer: Self-pay | Admitting: *Deleted

## 2024-11-27 ENCOUNTER — Encounter: Payer: Self-pay | Admitting: Oncology

## 2024-11-27 ENCOUNTER — Inpatient Hospital Stay (HOSPITAL_BASED_OUTPATIENT_CLINIC_OR_DEPARTMENT_OTHER): Admitting: Oncology

## 2024-11-27 ENCOUNTER — Telehealth: Payer: Self-pay | Admitting: Oncology

## 2024-11-27 ENCOUNTER — Inpatient Hospital Stay

## 2024-11-27 ENCOUNTER — Ambulatory Visit
Admission: RE | Admit: 2024-11-27 | Discharge: 2024-11-27 | Disposition: A | Discharge status: Home / Self Care | Source: Ambulatory Visit | Attending: Oncology | Admitting: Oncology

## 2024-11-27 ENCOUNTER — Ambulatory Visit
Admission: RE | Admit: 2024-11-27 | Discharge: 2024-11-27 | Disposition: A | Discharge status: Home / Self Care | Attending: Oncology | Admitting: Oncology

## 2024-11-27 VITALS — BP 110/71 | HR 82

## 2024-11-27 VITALS — BP 98/76 | HR 85 | Temp 98.7°F | Resp 18 | Wt 182.5 lb

## 2024-11-27 DIAGNOSIS — R630 Anorexia: Secondary | ICD-10-CM

## 2024-11-27 DIAGNOSIS — C50919 Malignant neoplasm of unspecified site of unspecified female breast: Secondary | ICD-10-CM

## 2024-11-27 DIAGNOSIS — C210 Malignant neoplasm of anus, unspecified: Secondary | ICD-10-CM

## 2024-11-27 DIAGNOSIS — K0889 Other specified disorders of teeth and supporting structures: Secondary | ICD-10-CM

## 2024-11-27 DIAGNOSIS — R110 Nausea: Secondary | ICD-10-CM

## 2024-11-27 DIAGNOSIS — R109 Unspecified abdominal pain: Secondary | ICD-10-CM | POA: Insufficient documentation

## 2024-11-27 DIAGNOSIS — R197 Diarrhea, unspecified: Secondary | ICD-10-CM

## 2024-11-27 DIAGNOSIS — D696 Thrombocytopenia, unspecified: Secondary | ICD-10-CM | POA: Diagnosis not present

## 2024-11-27 DIAGNOSIS — D701 Agranulocytosis secondary to cancer chemotherapy: Secondary | ICD-10-CM

## 2024-11-27 DIAGNOSIS — T451X5A Adverse effect of antineoplastic and immunosuppressive drugs, initial encounter: Secondary | ICD-10-CM

## 2024-11-27 DIAGNOSIS — I2699 Other pulmonary embolism without acute cor pulmonale: Secondary | ICD-10-CM | POA: Diagnosis not present

## 2024-11-27 LAB — CBC WITH DIFFERENTIAL (CANCER CENTER ONLY)
Abs Immature Granulocytes: 0.02 K/uL (ref 0.00–0.07)
Basophils Absolute: 0 K/uL (ref 0.0–0.1)
Basophils Relative: 2 %
Eosinophils Absolute: 0 K/uL (ref 0.0–0.5)
Eosinophils Relative: 4 %
HCT: 30 % — ABNORMAL LOW (ref 36.0–46.0)
Hemoglobin: 9.6 g/dL — ABNORMAL LOW (ref 12.0–15.0)
Immature Granulocytes: 2 %
Lymphocytes Relative: 18 %
Lymphs Abs: 0.2 K/uL — ABNORMAL LOW (ref 0.7–4.0)
MCH: 32.5 pg (ref 26.0–34.0)
MCHC: 32 g/dL (ref 30.0–36.0)
MCV: 101.7 fL — ABNORMAL HIGH (ref 80.0–100.0)
Monocytes Absolute: 0.1 K/uL (ref 0.1–1.0)
Monocytes Relative: 8 %
Neutro Abs: 0.8 K/uL — ABNORMAL LOW (ref 1.7–7.7)
Neutrophils Relative %: 66 %
Platelet Count: 83 K/uL — ABNORMAL LOW (ref 150–400)
RBC: 2.95 MIL/uL — ABNORMAL LOW (ref 3.87–5.11)
RDW: 14.3 % (ref 11.5–15.5)
WBC Count: 1.2 K/uL — ABNORMAL LOW (ref 4.0–10.5)
nRBC: 0 % (ref 0.0–0.2)

## 2024-11-27 LAB — CMP (CANCER CENTER ONLY)
ALT: 21 U/L (ref 0–44)
AST: 15 U/L (ref 15–41)
Albumin: 3.9 g/dL (ref 3.5–5.0)
Alkaline Phosphatase: 70 U/L (ref 38–126)
Anion gap: 10 (ref 5–15)
BUN: 9 mg/dL (ref 6–20)
CO2: 20 mmol/L — ABNORMAL LOW (ref 22–32)
Calcium: 9.1 mg/dL (ref 8.9–10.3)
Chloride: 109 mmol/L (ref 98–111)
Creatinine: 0.61 mg/dL (ref 0.44–1.00)
GFR, Estimated: >60 mL/min
Glucose, Bld: 95 mg/dL (ref 70–99)
Potassium: 4 mmol/L (ref 3.5–5.1)
Sodium: 139 mmol/L (ref 135–145)
Total Bilirubin: 0.4 mg/dL (ref 0.0–1.2)
Total Protein: 6.2 g/dL — ABNORMAL LOW (ref 6.5–8.1)

## 2024-11-27 MED ORDER — SODIUM CHLORIDE 0.9 % IV SOLN
Freq: Once | INTRAVENOUS | Status: AC
  Filled 2024-11-27: qty 250

## 2024-11-27 MED ORDER — ONDANSETRON HCL 4 MG/2ML IJ SOLN
4.0000 mg | Freq: Once | INTRAMUSCULAR | Status: AC
  Administered 2024-11-27: 4 mg via INTRAVENOUS
  Filled 2024-11-27: qty 2

## 2024-11-27 MED ORDER — ESOMEPRAZOLE MAGNESIUM 40 MG PO CPDR: 40.0000 mg | DELAYED_RELEASE_CAPSULE | Freq: Every day | ORAL | 1 refills | Status: AC

## 2024-11-27 MED ORDER — FAMOTIDINE IN NACL 20-0.9 MG/50ML-% IV SOLN
20.0000 mg | Freq: Once | INTRAVENOUS | Status: AC
  Administered 2024-11-27: 20 mg via INTRAVENOUS
  Filled 2024-11-27: qty 50

## 2024-11-27 MED ORDER — DEXAMETHASONE SOD PHOSPHATE PF 10 MG/ML IJ SOLN
10.0000 mg | Freq: Once | INTRAMUSCULAR | Status: AC
  Administered 2024-11-27: 10 mg via INTRAVENOUS
  Filled 2024-11-27: qty 1

## 2024-11-27 NOTE — Telephone Encounter (Signed)
 Pt called triage.

## 2024-11-27 NOTE — Assessment & Plan Note (Signed)
"   Megace  80 mg twice daily.  "

## 2024-11-27 NOTE — Telephone Encounter (Signed)
 Appts from 1/15 moved to today (1/14)

## 2024-11-27 NOTE — Assessment & Plan Note (Signed)
 X-ray abdomen Stool studies IV Pepcid , Zofran  dexamethasone 

## 2024-11-27 NOTE — Assessment & Plan Note (Signed)
 Secondary to chemotherapy.  Monitor platelet counts

## 2024-11-27 NOTE — Assessment & Plan Note (Addendum)
 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 mT1c pN30mi Initial margin was positive and patient underwent reexcision and achieved a negative margin.  Recommend  adjuvant ddAC followed by weekly Taxol. Baseline Echo LVEF 60-65%.Rationale and side effects were reviewed with patient.  Labs are reviewed and discussed with patient. S/p cycle 2 ddAC with D3 GCSF.  Patient has moderate difficulties tolerating chemotherapy.  I will postpone next treatment for a week. Continue supportive care.  Hydration

## 2024-11-27 NOTE — Assessment & Plan Note (Signed)
 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  Per IR, right inguinal lymph node is not enlarged.  Previously on concurrent chemotherapy 5-FU and mitomycin  C with Radiation. S/p D1 5-FU and Mitomycin -C. D29 5-FU infusion was switched to concurrent Xeloda  due to lack of medi port.- she has picc line. Received D29 Mitomycin  C. Chemo was interrupted due to multi focal pneumonia.  S/p concurrent Xeloda  with Radiation [last RT 09/24/2024 .   Future plan - DRE 8-12 weeks after treatment - Jan 2026.-Recommend patient to follow-up with Dr. Tye for DRE I will re image after she finishes adjuvant breast chemotherapy

## 2024-11-27 NOTE — Progress Notes (Signed)
 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 mT1c pN4mi Initial margin was positive and patient underwent reexcision and achieved a negative margin.  Recommend  adjuvant ddAC followed by weekly Taxol. Baseline Echo LVEF 60-65%.Rationale and side effects were reviewed with patient.  Labs are reviewed and discussed with patient. S/p cycle 2 ddAC with D3 GCSF.  Patient has moderate difficulties tolerating chemotherapy.  I will postpone next treatment for a week. Continue supportive care.  Hydration  Acute subsegmental pulmonary embolism (HCC) Continue Lovenox  1 mg/kg twice daily.  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  Per IR, right inguinal lymph node is not enlarged.  Previously on concurrent chemotherapy 5-FU and mitomycin  C with Radiation. S/p D1 5-FU and Mitomycin -C. D29 5-FU infusion was switched to concurrent Xeloda  due to lack of medi port.- she has picc line. Received D29 Mitomycin  C. Chemo was interrupted due to multi focal pneumonia.  S/p concurrent Xeloda  with Radiation [last RT 09/24/2024 .   Future plan - DRE 8-12 weeks after treatment - Jan 2026.-Recommend patient to follow-up with Dr. Tye for DRE I will re image after she finishes adjuvant breast chemotherapy  Chemotherapy induced neutropenia She receives Day 3 long acting GCSF Neutropenia precaution.   Chemotherapy-induced nausea Continue home antiemetics phernegan and compazine  PRN.  Added lorazepam  0.5 every 12 hours as needed anxiety and nausea.   IV Zofran  4 mg and IV dexamethasone  10 mg  today.  Diarrhea Encourage oral hydration.  Imodium  as needed.  Recent antibiotics use check stool panel and C. difficile.  Pain, dental Poor dentition.  Encouraged patient to seek dental care. Add Peridex  oral solution twice daily Recommend local Orajel for pain control.  She may also use over-the-counter Tylenol  as needed. S/p amoxicillin  500 mg twice daily for 5 days while waiting for dental evaluation. Recommend checking CBC prior to dental extraction  Poor appetite  Megace  80 mg twice daily.   Abdominal pain X-ray abdomen Stool studies IV Pepcid , Zofran  dexamethasone   Thrombocytopenia Secondary to chemotherapy.  Monitor platelet counts       Orders Placed This Encounter  Procedures   C difficile quick screen w PCR reflex    Standing Status:   Future    Expected Date:   11/27/2024    Expiration Date:   02/25/2025   Gastrointestinal Panel by PCR , Stool    Standing Status:   Future    Expected Date:   11/27/2024    Expiration Date:   02/25/2025   DG Abd 2 Views    Standing Status:   Future    Number of Occurrences:   1    Expected Date:   11/27/2024    Expiration Date:   11/27/2025    Reason for Exam (SYMPTOM  OR DIAGNOSIS REQUIRED):   abdominal pain    Is patient pregnant?:   No    Preferred imaging location?:   [facility]   CBC with Differential (Cancer Center Only)    Standing Status:   Future    Expected Date:   12/11/2024    Expiration Date:   12/11/2025   CMP (Cancer Center only)    Standing  Status:   Future    Expected Date:   12/11/2024    Expiration Date:   12/11/2025   CMP (Cancer Center only)    Standing Status:   Future    Expected Date:   12/04/2024    Expiration Date:   03/04/2025   CBC with Differential (Cancer Center Only)    Standing Status:   Future    Expected Date:   12/04/2024    Expiration Date:   03/04/2025   Follow-up per los All questions were answered. The patient knows to call the clinic with any problems, questions or  concerns.  [name], MD, PhD [facility] Hematology Oncology 11/27/2024    HISTORY OF PRESENTING ILLNESS:  Porschia Willbanks Lovan 49 y.o. 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 Summary of oncologic history is as follows: Oncology History  Anal squamous cell carcinoma (HCC)  05/29/2024 Initial Diagnosis   Anal squamous cell carcinoma   Patient has noticed a hard mass at the anal area which she initially thought was a hemorrhoid.  The mass has been present for more than a month and appears to be enlarging. Patient was referred to establish care with surgeon Dr. Tye  05/16/2024, incisional biopsy of anal mass showed 1. Anus, biopsy, mass :       - INVASIVE MODERATELY DIFFERENTIATED SQUAMOUS CELL CARCINOMA  06/19/2024, left inguinal adenopathy biopsy showed Metastatic moderately differentiated squamous cell carcinoma.  '    05/29/2024 Cancer Staging   Staging form: Anus, AJCC V9 - Clinical stage from 05/29/2024: Stage IIIA (cT3, cN1a, cM0) - Signed by Cap Zelphia, MD on 06/26/2024 Stage prefix: Initial diagnosis   06/05/2024 Imaging   CT chest abdomen pelvis with contrast  Slight thickening in the area of the anal canal. Please correlate for history of neoplasm. There is a pathologic enlarged left inguinal node. There is a borderline right-sided node.   No additional areas of abnormal nodal enlargement or other aggressive appearing mass lesion at this time.   Fatty liver infiltration with known segment 7 hepatic hemangioma.   Of note there is a right-sided upper breast mass which was not clearly seen on prior CT scan. Please correlate for any prior study or if needed diagnostic mammographic evaluation and possible ultrasound when appropriate.     06/12/2024 Imaging   CT chest abdomen pelvis with contrast  showed Slight thickening in the area of the anal canal. Please correlate for history of neoplasm. There is a pathologic enlarged left inguinal node. There is a borderline right-sided node.   No additional areas of abnormal nodal enlargement or other aggressive appearing mass lesion at this time.   Fatty liver infiltration with known segment 7 hepatic hemangioma.   Of note there is a right-sided upper breast mass which was not clearly seen on prior CT scan. Please correlate for any prior study or if needed diagnostic mammographic evaluation and possible ultrasound when appropriate.   06/19/2024 Procedure   1. Lymph node, biopsy, left inguinal adenopathy :       -  METASTATIC MODERATELY DIFFERENTIATED SQUAMOUS CELL CARCINOMA    07/01/2024 Imaging   PET scan showed 1. Hypermetabolic anal mass with hypermetabolic left inguinal adenopathy, compatible with anorectal carcinoma. 2. Hypermetabolic right breast nodule with recent biopsies on 06/18/2024. Please correlate with pathology report.   07/31/2024 - 07/31/2024 Chemotherapy  Patient is on Treatment Plan : ANUS Mitomycin  D1,28 + 5FU D1-4, 28-31 q32d     08/23/2024 Genetic Testing   Negative genetic testing. No pathogenic variants identified on the [facility] CancerNext-Expanded+RNA Panel. The report date is 08/23/2024.  The CancerNext-Expanded gene panel offered by [facility] and includes sequencing, rearrangement, and RNA analysis for the following 77 genes: AIP, ALK, APC, ATM, AXIN2, BAP1, BARD1, BMPR1A, BRCA1, BRCA2, BRIP1, CDC73, CDH1, CDK4, CDKN1B, CDKN2A, CEBPA, CHEK2, CTNNA1, DDX41, DICER1, ETV6, FH, FLCN, GATA2, LZTR1, MAX, MBD4, MEN1, MET, MLH1, MSH2, MSH3, MSH6, MUTYH, NF1, NF2, NTHL1, PALB2, PHOX2B, PMS2, POT1, PRKAR1A, PTCH1, PTEN, RAD51C, RAD51D, RB1, RET, RPS20, RUNX1, SDHA, SDHAF2, SDHB, SDHC, SDHD, SMAD4, SMARCA4, SMARCB1, SMARCE1, STK11, SUFU, TMEM127, TP53, TSC1, TSC2, VHL, and WT1 (sequencing and deletion/duplication); EGFR,  HOXB13, KIT, MITF, PDGFRA, POLD1, and POLE (sequencing only); EPCAM and GREM1 (deletion/duplication only).    08/29/2024 - 08/29/2024 Chemotherapy   Patient is on Treatment Plan : ANUS Mitomycin  D1,29 + Capecitabine  + XRT     Invasive carcinoma of breast (HCC)  06/12/2024 Imaging   CT chest abdomen pelvis with contrast showed Slight thickening in the area of the anal canal. Please correlate for history of neoplasm. There is a pathologic enlarged left inguinal node. There is a borderline right-sided node.   No additional areas of abnormal nodal enlargement or other aggressive appearing mass lesion at this time.   Fatty liver infiltration with known segment 7 hepatic hemangioma.   Of note there is a right-sided upper breast mass which was not clearly seen on prior CT scan. Please correlate for any prior study or if needed diagnostic mammographic evaluation and possible ultrasound when appropriate.   06/17/2024 Mammogram   1. There is a suspicious 17 mm mass at the site of CT and mammographic concern in the RIGHT breast. It demonstrates associated pleomorphic calcifications. Recommend ultrasound-guided biopsy for definitive characterization. 2. There is incidental sonographic note of a 7 mm mass in the RIGHT breast at 12 o'clock and a 6 mm non mass area at 9 o'clock. Recommend ultrasound-guided biopsy of these 2 areas for definitive characterization given suspicious appearance of the dominant mass. 3. No suspicious RIGHT axillary adenopathy. 4. No mammographic evidence of malignancy in the LEFT breast.   06/18/2024 Cancer Staging   Staging form: Breast, AJCC 8th Edition - Clinical stage from 06/18/2024: Stage IB (cT1b, cN0, cM0, G3, ER-, PR-, HER2-) - Signed by Babara Call, MD on 06/26/2024 Stage prefix: Initial diagnosis Histologic grading system: 3 grade system   06/18/2024 Initial Diagnosis   Invasive carcinoma of breast [facility])  Patient with right breast biopsy.  Pathology showed 1.  Breast, right, needle core biopsy, 12:00 12cmfn (heart clip) :      INVASIVE DUCTAL CARCINOMA      TUBULE FORMATION: SCORE 3      NUCLEAR PLEOMORPHISM: SCORE 3      MITOTIC COUNT: SCORE 2      TOTAL SCORE: 8      OVERALL GRADE: 3      LYMPHOVASCULAR INVASION: NOT IDENTIFIED      CANCER LENGTH: 1.0 CM      CALCIFICATIONS: NOT IDENTIFIED      OTHER FINDINGS: NONE      ER negative, PR negative, HER2 negative (IHC 0) Ki-67 95%.       2. Breast, right, needle core biopsy, 12:00 8cmfn (coil clip) :      INVASIVE DUCTAL CARCINOMA      DUCTAL CARCINOMA IN SITU, SOLID, HIGH NUCLEAR  GRADE WITH NECROSIS      TUBULE FORMATION: SCORE 3      NUCLEAR PLEOMORPHISM: SCORE 3      MITOTIC COUNT: SCORE 2      TOTAL SCORE: 8      OVERALL GRADE: 3      LYMPHOVASCULAR INVASION: NOT IDENTIFIED      CANCER LENGTH: 0.3 CM      CALCIFICATIONS: NOT IDENTIFIED      OTHER FINDINGS: NONE            3. Breast, right, needle core biopsy, 9:00 12cmfn (venus) :      BENIGN BREAST TISSUE WITH DENSE STROMAL FIBROSIS.      NEGATIVE FOR ATYPIA OR MALIGNANCY.    07/08/2024 Surgery   Patient went right breast lumpectomy and sentinel lymph node biopsy.  1. 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      - DUCTAL CARCINOMA IN SITU (DCIS).      - SEE CANCER SUMMARY AND NOTE BELOW.      - TWO BIOPSY SITES WITH CORRESPONDING HEART AND COIL CLIPS.      - TWO SAVI SCOUT TAGS.       2. Lymph node, sentinel, biopsy, Right :      - MICROMETASTATIC CARCINOMA INVOLVES ONE OF FIVE LYMPH NODES (1MI/5).      - SEE CANCER SUMMARY AND NOTE BELOW.       3. Breast, excision, Right medial posterior margin :      - BENIGN BREAST TISSUE.      - NEGATIVE FOR ATYPIA AND MALIGNANCY.      - SEE CANCER SUMMARY BELOW.   TUMOR Histologic Type: Invasive carcinoma of no special type (ductal) Histologic Grade (Nottingham Histologic Score) Glandular (Acinar)/Tubular  Differentiation: 3 Nuclear Pleomorphism: 3 Mitotic Rate: 3 Overall Grade: 3 Tumor Size: Greatest dimension of largest invasive focus: 19 mm Ductal Carcinoma In Situ (DCIS): Present, high-grade with comedonecrosis Lymphatic and/or Vascular Invasion: Present, extensive (LVI in 2 or more blocks)  Treatment Effect in the Breast: No known presurgical therapy MARGINS Margin Status for Invasive Carcinoma: Invasive Carcinoma involves inferior (unifocal) and superior (unifocal) margins and is 0.5 mm to anterior margin Margin Status for DCIS: All margins negative for DCIS Distance from DCIS to closest margin: 0.5 mm Specify closest margin: Inferior REGIONAL LYMPH NODES Regional Lymph Node Status: Tumor present in regional lymph node(s) Number of Lymph Nodes with Macrometastases (greater than 2 mm): 0 Number of Lymph Nodes with Micrometastases (greater than 0.2 mm to 2 mm and/or greater than 200 cells): 1 Number of Lymph Nodes with Isolated Tumor Cells (0.2 mm or less OR 200 cells or less): 0 Size of Largest Metastatic Deposit: 1.9 mm Extranodal Extension: Not identified Total Number of Lymph Nodes Examined (sentinel and non-sentinel): 5 Number of Sentinel Nodes Examined: 5 DISTANT METASTASIS Distant Site(s) Involved, if applicable: Not applicable PATHOLOGIC STAGE CLASSIFICATION (pTNM, AJCC 8th Edition): Modified Classification: Not applicable pT Category: pT1c T Suffix: (m) multiple primary synchronous tumors in a single organ pN Category: pN41mi N Suffix: (sn) pM Category: Not applicable SPECIAL STUDIES Breast Biomarker Testing Performed on Previous Biopsy: [id number] (heart) Estrogen Receptor: Negative (0%) Progesterone Receptor: Negative (0%) HER2 IHC: Negative (0) Proliferation Marker Ki67: 95%   07/22/2024 Cancer Staging   Staging form: Breast, AJCC 8th Edition - Pathologic stage from 07/22/2024: Stage IB (pT1c, pN72mi(sn), cM0, G3, ER-, PR-, HER2-) - Signed by Babara Call,  MD on  07/22/2024 Stage prefix: Initial diagnosis Method of lymph node assessment: Sentinel lymph node biopsy Multigene prognostic tests performed: None Histologic grading system: 3 grade system   07/25/2024 Surgery   Patient reports additional excision for positive margin.  1. Breast, excision, superior margin of right breast - RESIDUAL INVASIVE DUCTAL CARCINOMA, COMPLETELY EXCISED. - CARCINOMA PRESENT 2 MM FROM THE POSTERIOR MARGIN AND 4 MM FROM THE ANTERIOR AND SUPERIOR MARGINS. 2. Breast, excision, inferior margin of right breast - RESIDUAL INVASIVE DUCTAL CARCINOMA, COMPLETELY EXCISED. - CARCINOMA PRESENT 1 MM FROM THE NEW INFERIOR MARGIN.   08/23/2024 Genetic Testing   Negative genetic testing. No pathogenic variants identified on the [facility] CancerNext-Expanded+RNA Panel. The report date is 08/23/2024.  The CancerNext-Expanded gene panel offered by [facility] and includes sequencing, rearrangement, and RNA analysis for the following 77 genes: AIP, ALK, APC, ATM, AXIN2, BAP1, BARD1, BMPR1A, BRCA1, BRCA2, BRIP1, CDC73, CDH1, CDK4, CDKN1B, CDKN2A, CEBPA, CHEK2, CTNNA1, DDX41, DICER1, ETV6, FH, FLCN, GATA2, LZTR1, MAX, MBD4, MEN1, MET, MLH1, MSH2, MSH3, MSH6, MUTYH, NF1, NF2, NTHL1, PALB2, PHOX2B, PMS2, POT1, PRKAR1A, PTCH1, PTEN, RAD51C, RAD51D, RB1, RET, RPS20, RUNX1, SDHA, SDHAF2, SDHB, SDHC, SDHD, SMAD4, SMARCA4, SMARCB1, SMARCE1, STK11, SUFU, TMEM127, TP53, TSC1, TSC2, VHL, and WT1 (sequencing and deletion/duplication); EGFR, HOXB13, KIT, MITF, PDGFRA, POLD1, and POLE (sequencing only); EPCAM and GREM1 (deletion/duplication only).    10/23/2024 -  Chemotherapy   Patient is on Treatment Plan : BREAST DOSE DENSE AC q14d / PACLitaxel q7d      Discussed the use of AI scribe software for clinical note transcription with the patient, who gave verbal consent to proceed.   Patient has a history of VIN 3 status post vulvectomy  07/11/2024,she was evaluated by cardiology with palpitations and  atypical chest pain. Chest pain deemed atypical and she went on to have a normal echo at 07/19/2024.   07/23/2024, patient was seen by symptom management for right neck pain.  She was seen by Tinnie Dawn.  Right upper extremity ultrasound was obtained which showed acute occlusive DVT involving the right internal jugular and subclavian veins.  Patient was started on Lovenox , she was supposed to be started on 1 mg/kg twice daily however was started off 40 mg twice daily which is a subtherapeutic dosage. She was asked to hold off the evening dose prior to her breast re-excision.  07/29/2024, patient presented emergency room due to shortness of breath and chest pain.  CTA angiogram showed subsegmental PE medially in the left lower lobe.  Shortness of breath was felt to be multifactorial secondary to asthma/bronchitis, pulmonary embolism.  Symptoms are better after supportive care and she was discharged on Lovenox  80 mg subcutaneously twice daily.  08/31/2024 -09/06/2024 hospitalized due to sepsis due to multifocal pneumonia, UTI. Respiratory viral panel positive for metapneumovirus  Patient resumed on radiation on 09/16/24.  Patient took Xeloda  twice daily and continued radiation till 09/19/2024.  She was not able to continue radiation on 09/20/2024 due to severe diarrhea.  11/7 radiation was not given due to patient's symptoms Final radiation was done on 09/25/2024. Last dose of Xeloda  was omitted.     INTERVAL HISTORY Dymin Dingledine Forgey is a 49 y.o. female who has above history reviewed by me today presents for follow up visit prior to chemo  Discussed the use of AI scribe software for clinical note transcription with the patient, who gave verbal consent to proceed.   Since the last chemotherapy session 1 week ago,  she has noticed constant  abdominal pain rated 6/10, localized to the lower abdomen and epigastric regions. The pain is associated with abdominal distension prior to loose stools, worsened by  movement, sitting, and lying down, and partially relieved by lying on their side with legs flexed. Nausea began approximately one hour ago. Appetite is poor, with only one substantial meal per day, typically at night, encouraged by family. Headaches are also present.  Bowel habits initially included constipation until 3 days after chemo, despite use of apple juice, naloxone , Miralax  (partial doses), coffee, and juice. Subsequently, stool consistency alternated between small, formed pellets and liquid diarrhea. Over the past two days, they have had exclusively loose stools, with three episodes of incontinence and seven to eight bowel movements yesterday. Lomotil  provided partial symptomatic relief. No hematochezia reported.  Menopausal symptoms have intensified since chemotherapy initiation, with frequent and severe hot flashes, diaphoresis, and significant sleep disturbance. Menses have ceased since the first chemotherapy cycle. They describe alternating episodes of chills and hot flashes.  Gastroesophageal reflux symptoms persist, managed with long-term Nexium  twice daily for over ten years due to hiatal hernia,  Epigastric pain and reflux symptoms have recently worsened.  Rectal symptoms are improving, though the patient reports feeling two small areas of tenderness. No current rectal pain or bleeding.  Dental pain is present due to two teeth requiring extraction. Tylenol  has been used for analgesia, but dental procedures have been postponed due to neutropenia identified on recent laboratory evaluation. They recently completed a course of amoxicillin  for infection prophylaxis.  MEDICAL HISTORY:  Past Medical History:  Diagnosis Date   Anemia    Anginal pain    Anxiety    Arthritis    Asthma    Cancer (HCC)    rectal cancer, breast cancer 2025   Chronic pain syndrome 05/21/2023   DDD (degenerative disc disease), cervical 05/22/2023   Deep venous thrombosis (HCC)    Depression    Dyspnea     Fibromyalgia    GERD (gastroesophageal reflux disease)    Headache    Heart murmur    History of hiatal hernia    History of kidney stones    Lupus    Malignant neoplasm of upper-outer quadrant of right female breast, unspecified estrogen receptor status (HCC) 2025   Pneumonia    PTSD (post-traumatic stress disorder)    Skin candidiasis 05/29/2024   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    SURGICAL HISTORY: Past Surgical History:  Procedure Laterality Date   BREAST BIOPSY Right 06/18/2024   US  RT BREAST BX W LOC DEV 1ST LESION IMG BX SPEC US  GUIDE 06/18/2024 [facility]   BREAST BIOPSY Right 06/18/2024   US  RT BREAST BX W LOC DEV EA ADD LESION IMG BX SPEC US  GUIDE 06/18/2024 [facility]   BREAST BIOPSY Right 06/18/2024   US  RT BREAST BX W LOC DEV EA ADD LESION IMG BX SPEC US  GUIDE 06/18/2024 [facility]   BREAST BIOPSY Right 07/05/2024   US  RT BREAST SAVI/RF TAG 1ST LESION US  GUIDE 07/05/2024 [facility]   BREAST BIOPSY Right 07/05/2024   US  RT BREAST SAVI/RF TAG EA ADD'L LESION US  GUIDE 07/05/2024 [facility]   BREAST LUMPECTOMY Right 07/25/2024   Procedure: BREAST LUMPECTOMY;  Surgeon: Tye Millet, DO;  Location: [facility];  Service: General;  Laterality: Right;   BREAST LUMPECTOMY WITH RADIO FREQUENCY LOCALIZER Right 07/08/2024   Procedure: BREAST LUMPECTOMY WITH RADIO FREQUENCY LOCALIZER;  Surgeon: Tye Millet, DO;  Location: [facility];  Service: General;  Laterality: Right;   COLONOSCOPY  WITH PROPOFOL  N/A 02/17/2023   Procedure: COLONOSCOPY WITH PROPOFOL ;  Surgeon: Maryruth Ole DASEN, MD;  Location: [facility] ENDOSCOPY;  Service: Endoscopy;  Laterality: N/A;   ESOPHAGOGASTRODUODENOSCOPY (EGD) WITH PROPOFOL  N/A 02/17/2023   Procedure: ESOPHAGOGASTRODUODENOSCOPY (EGD) WITH PROPOFOL ;  Surgeon: Maryruth Ole DASEN, MD;  Location: [facility] ENDOSCOPY;  Service: Endoscopy;  Laterality: N/A;   EXTRACORPOREAL SHOCK WAVE LITHOTRIPSY     IR CV LINE INJECTION   08/21/2024   IR REMOVAL TUN ACCESS W/ PORT W/O FL MOD SED  08/28/2024   PICC LINE INSERTION N/A 08/2024   PORTA CATH INSERTION N/A 10/14/2024   Procedure: PORTA CATH INSERTION;  Surgeon: Marea Selinda RAMAN, MD;  Location: [facility]AS[facility];  Service: Cardiovascular;  Laterality: N/A;   PORTA CATH REMOVAL N/A 10/14/2024   Procedure: PORTA CATH REMOVAL;  Surgeon: Marea Selinda RAMAN, MD;  Location: [facility];  Service: Cardiovascular;  Laterality: N/A;   PORTACATH PLACEMENT Right 07/08/2024   Procedure: INSERTION, TUNNELED CENTRAL VENOUS DEVICE, WITH PORT;  Surgeon: Tye Millet, DO;  Location: [facility];  Service: General;  Laterality: Right;   RECTAL EXAM UNDER ANESTHESIA N/A 05/16/2024   Procedure: EXAM UNDER ANESTHESIA, RECTUM, INCISIONAL BIOPSY OF ANAL MASS;  Surgeon: Tye Millet, DO;  Location: [facility];  Service: General;  Laterality: N/A;   SENTINEL NODE BIOPSY Right 07/08/2024   Procedure: BIOPSY, LYMPH NODE, SENTINEL;  Surgeon: Tye Millet, DO;  Location: [facility];  Service: General;  Laterality: Right;   TONSILLECTOMY     TUBAL LIGATION     VULVECTOMY N/A 07/29/2020   Procedure: WIDE EXCISION VULVECTOMY;  Surgeon: Mancil Barter, MD;  Location: [facility];  Service: Gynecology;  Laterality: N/A;    SOCIAL HISTORY: Social History   Socioeconomic History   Marital status: Divorced    Spouse name: Not on file   Number of children: 1   Years of education: Not on file   Highest education level: Not on file  Occupational History   Not on file  Tobacco Use   Smoking status: Former    Types: Cigarettes   Smokeless tobacco: Never  Vaping Use   Vaping status: Never Used  Substance and Sexual Activity   Alcohol use: Not Currently   Drug use: No   Sexual activity: Yes  Other Topics Concern   Not on file  Social History Narrative   Not on file   Social Drivers of Health   Tobacco Use: Medium Risk (11/27/2024)   Patient History    Smoking Tobacco Use: Former    Smokeless  Tobacco Use: Never    Passive Exposure: Not on file  Financial Resource Strain: Medium Risk (05/15/2024)   Received from [facility]   Overall Financial Resource Strain (CARDIA)    Difficulty of Paying Living Expenses: Somewhat hard  Food Insecurity: No Food Insecurity (09/07/2024)   Epic    Worried About Programme Researcher, Broadcasting/film/video in the Last Year: Never true    Ran Out of Food in the Last Year: Never true  Recent Concern: Food Insecurity - Food Insecurity Present (08/22/2024)   Epic    Worried About Programme Researcher, Broadcasting/film/video in the Last Year: Sometimes true    Ran Out of Food in the Last Year: Sometimes true  Transportation Needs: No Transportation Needs (09/07/2024)   Epic    Lack of Transportation (Medical): No    Lack of Transportation (Non-Medical): No  Physical Activity: Not on file  Stress: Not on file  Social Connections:  Not on file  Intimate Partner Violence: Not At Risk (09/07/2024)   Epic    Fear of Current or Ex-Partner: No    Emotionally Abused: No    Physically Abused: No    Sexually Abused: No  Depression (PHQ2-9): Low Risk (11/20/2024)   Depression (PHQ2-9)    PHQ-2 Score: 0  Alcohol Screen: Low Risk (10/16/2024)   Alcohol Screen    Last Alcohol Screening Score (AUDIT): 0  Housing: Low Risk (10/16/2024)   Epic    Unable to Pay for Housing in the Last Year: No    Number of Times Moved in the Last Year: 1    Homeless in the Last Year: No  Utilities: Not At Risk (09/07/2024)   Epic    Threatened with loss of utilities: No  Health Literacy: Not on file    FAMILY HISTORY: Family History  Problem Relation Age of Onset   Diabetes Mother    Congestive Heart Failure Mother    Colon cancer Father 40   Lupus Maternal Grandmother    Cancer Maternal Grandfather        either prostate or colon   Colon cancer Paternal Grandfather    Lung cancer Paternal Grandfather     ALLERGIES:  is allergic to prednisone and silicone.  MEDICATIONS:  Current Outpatient  Medications  Medication Sig Dispense Refill   acetaminophen  (TYLENOL ) 500 MG tablet Take 500 mg by mouth every 6 (six) hours as needed.     albuterol  (VENTOLIN  HFA) 108 (90 Base) MCG/ACT inhaler Inhale 2 puffs into the lungs every 4 (four) hours as needed for wheezing or shortness of breath. 8.5 g 2   ascorbic acid (VITAMIN C) 500 MG tablet Take 500 mg by mouth daily.     azelastine  (ASTELIN ) 0.1 % nasal spray Place 1 spray into both nostrils 2 (two) times daily. Use in each nostril as directed 30 mL 0   benzonatate  (TESSALON ) 200 MG capsule Take 1 capsule (200 mg total) by mouth 3 (three) times daily as needed for cough. 30 capsule 1   BIOTIN PO Take by mouth daily.     budesonide  (PULMICORT ) 0.25 MG/2ML nebulizer solution Take 2 mLs (0.25 mg total) by nebulization 2 (two) times daily. 60 mL 12   busPIRone  (BUSPAR ) 5 MG tablet Take 5 mg by mouth 2 (two) times daily.     chlorhexidine  (PERIDEX ) 0.12 % solution Use as directed 10 mLs in the mouth or throat 2 (two) times daily. 473 mL 0   cholecalciferol (VITAMIN D3) 25 MCG (1000 UNIT) tablet Take 1,000 Units by mouth daily.     dexamethasone  (DECADRON ) 4 MG tablet Take 2 tablets (8 mg total) by mouth daily for 2 days. Start the day after doxorubicin /cyclophosphamide  chemotherapy. Take with food. 30 tablet 1   diphenoxylate -atropine  (LOMOTIL ) 2.5-0.025 MG tablet Take 1 tablet by mouth 4 (four) times daily. 90 tablet 0   DULoxetine  (CYMBALTA ) 30 MG capsule Take 30 mg by mouth at bedtime.     enoxaparin  (LOVENOX ) 80 MG/0.8ML injection Inject 0.8 mLs (80 mg total) into the skin every 12 (twelve) hours. 48 mL 2   gabapentin  (NEURONTIN ) 300 MG capsule Take 2 capsules (600 mg total) by mouth 3 (three) times daily. 180 capsule 2   ibuprofen  (ADVIL ) 200 MG tablet Take 200 mg by mouth every 6 (six) hours as needed.     ipratropium-albuterol  (DUONEB) 0.5-2.5 (3) MG/3ML SOLN Take 3 mLs by nebulization 4 (four) times daily. 360 mL 3   lactose  free nutrition  (BOOST PLUS) LIQD Take 237 mLs by mouth 2 (two) times daily between meals. 237 mL 11   lidocaine -prilocaine  (EMLA ) cream Apply 1 Application topically as needed. 30 g 3   linaclotide (LINZESS) 145 MCG CAPS capsule Take 145 mcg by mouth daily before breakfast.     LORazepam  (ATIVAN ) 0.5 MG tablet Take 1 tablet (0.5 mg total) by mouth every 12 (twelve) hours as needed for anxiety (nausea). 30 tablet 0   megestrol  (MEGACE ) 40 MG tablet Take 2 tablets (80 mg total) by mouth 2 (two) times daily. 120 tablet 0   montelukast  (SINGULAIR ) 10 MG tablet Take 1 tablet (10 mg total) by mouth See admin instructions. Take 1 tablet on the day prior to chemotherapy and take 1 tablet daily for 2 days after chemotherapy. 30 tablet 0   Multiple Vitamin (MULTIVITAMIN WITH MINERALS) TABS tablet Take 1 tablet by mouth daily.     nystatin  (MYCOSTATIN ) 100000 UNIT/ML suspension Take 5 mLs (500,000 Units total) by mouth 4 (four) times daily. Swish and swallow 473 mL 1   oxyCODONE  (OXY IR/ROXICODONE ) 5 MG immediate release tablet Take 1 tablet (5 mg total) by mouth every 6 (six) hours as needed for severe pain (pain score 7-10) or breakthrough pain. 15 tablet 0   potassium chloride  SA (KLOR-CON  M) 20 MEQ tablet Take 1 tablet (20 mEq total) by mouth 2 (two) times daily. 60 tablet 0   prochlorperazine  (COMPAZINE ) 10 MG tablet Take 1 tablet (10 mg total) by mouth every 6 (six) hours as needed for nausea or vomiting. 30 tablet 1   promethazine  (PHENERGAN ) 25 MG tablet Take 1 tablet (25 mg total) by mouth every 8 (eight) hours as needed for nausea or vomiting. 60 tablet 0   traZODone  (DESYREL ) 50 MG tablet Take 1 tablet (50 mg total) by mouth at bedtime. 30 tablet 2   esomeprazole  (NEXIUM ) 40 MG capsule Take 1 capsule (40 mg total) by mouth daily. 90 capsule 1   naloxone  (NARCAN ) nasal spray 4 mg/0.1 mL SPRAY 1 SPRAY INTO ONE NOSTRIL AS DIRECTED FOR OPIOID OVERDOSE (TURN PERSON ON SIDE AFTER DOSE. IF NO RESPONSE IN 2-3 MINUTES OR  PERSON RESPONDS BUT RELAPSES, REPEAT USING A NEW SPRAY DEVICE AND SPRAY INTO THE OTHER NOSTRIL. CALL 911 AFTER USE.) * EMERGENCY USE ONLY * (Patient not taking: Reported on 11/27/2024) 1 each 0   Turmeric (QC TUMERIC COMPLEX PO) Take by mouth daily. (Patient not taking: Reported on 11/27/2024)     No current facility-administered medications for this visit.    Review of Systems  Constitutional:  Positive for appetite change and fatigue. Negative for chills and fever.  HENT:   Negative for hearing loss and voice change.        Tooth pain  Eyes:  Negative for eye problems.  Respiratory:  Negative for chest tightness, cough and shortness of breath.   Cardiovascular:  Negative for chest pain.  Gastrointestinal:  Positive for abdominal pain, constipation, diarrhea and nausea. Negative for abdominal distention, blood in stool and vomiting.  Endocrine: Negative for hot flashes.  Genitourinary:  Negative for difficulty urinating, dysuria and frequency.   Musculoskeletal:  Negative for arthralgias.  Skin:  Negative for itching and rash.  Neurological:  Negative for extremity weakness.  Hematological:  Negative for adenopathy.  Psychiatric/Behavioral:  Negative for confusion.      PHYSICAL EXAMINATION: ECOG PERFORMANCE STATUS: 0 - Asymptomatic  Vitals:   11/27/24 1456  BP: 98/76  Pulse: 85  Resp: 18  Temp: 98.7 F (37.1 C)  SpO2: 100%   Filed Weights   11/27/24 1456  Weight: 182 lb 8 oz (82.8 kg)    Physical Exam Constitutional:      General: She is not in acute distress.    Appearance: She is not diaphoretic.  HENT:     Head: Normocephalic and atraumatic.     Mouth/Throat:     Pharynx: No oropharyngeal exudate.  Eyes:     General: No scleral icterus. Cardiovascular:     Rate and Rhythm: Normal rate and regular rhythm.     Heart sounds: No murmur heard. Pulmonary:     Effort: Pulmonary effort is normal. No respiratory distress.     Breath sounds: No wheezing.  Abdominal:      General: There is no distension.     Palpations: Abdomen is soft.     Tenderness: There is no abdominal tenderness.  Musculoskeletal:        General: Normal range of motion.     Cervical back: Normal range of motion and neck supple.  Skin:    General: Skin is warm and dry.     Findings: No erythema.     Comments: Left chest wall medi port, mild erythematous changes at the incision site.   Neurological:     Mental Status: She is alert and oriented to person, place, and time. Mental status is at baseline.     Motor: No abnormal muscle tone.  Psychiatric:        Mood and Affect: Affect normal.         LABORATORY DATA:  I have reviewed the data as listed    Latest Ref Rng & Units 11/27/2024    2:41 PM 11/20/2024   11:14 AM 11/11/2024    1:26 PM  CBC  WBC 4.0 - 10.5 K/uL 1.2  4.4  7.0   Hemoglobin 12.0 - 15.0 g/dL 9.6  88.8  87.6   Hematocrit 36.0 - 46.0 % 30.0  35.0  37.7   Platelets 150 - 400 K/uL 83  310  503       Latest Ref Rng & Units 11/27/2024    2:41 PM 11/20/2024   11:14 AM 11/11/2024    1:26 PM  CMP  Glucose 70 - 99 mg/dL 95  866  852   BUN 6 - 20 mg/dL 9  11  <5   Creatinine 0.44 - 1.00 mg/dL 9.38  9.18  9.03   Sodium 135 - 145 mmol/L 139  141  141   Potassium 3.5 - 5.1 mmol/L 4.0  4.0  3.7   Chloride 98 - 111 mmol/L 109  107  103   CO2 22 - 32 mmol/L 20  22  23    Calcium 8.9 - 10.3 mg/dL 9.1  9.1  9.6   Total Protein 6.5 - 8.1 g/dL 6.2  6.1  6.7   Total Bilirubin 0.0 - 1.2 mg/dL 0.4  0.3  0.4   Alkaline Phos 38 - 126 U/L 70  85  83   AST 15 - 41 U/L 15  44  35   ALT 0 - 44 U/L 21  38  26      RADIOGRAPHIC STUDIES: I have personally reviewed the radiological images as listed and agreed with the findings in the report. DG Abd 2 Views Result Date: 11/27/2024 CLINICAL DATA:  Abdominal pain EXAM: ABDOMEN - 2 VIEW COMPARISON:  12/24/2018, CT 08/17/2024 FINDINGS: Central venous catheter tip in the right atrium.  No free air beneath the diaphragm.  Nonobstructed gas pattern. Phleboliths in the pelvis. IMPRESSION: Nonobstructed gas pattern. Electronically Signed   By: Luke Bun M.D.   On: 11/27/2024 17:33     "

## 2024-11-27 NOTE — Telephone Encounter (Signed)
 Caller verified using pt's full name and dob prior to discussing PHI    Patient called to request an apt with [facility] to have her labs and fluids today instead of tomorrow. Reports dizziness, fatigue and diarrhea. Diarrhea started 11/25/24 after an episode of constipation. Patient used laxatives to help her have a bm this weekend. Stopped laxatives after having bm. She now has uncontrollable diarrhea and periods of incontinence.  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  Consistency:  Liquid  Color:  brown  Odor:  yes  Undigested food or fat:  No  Mucous present:  Yes  Blood present:  No  Number of stools in past 24 hours:  4 loose stools today; 7-8 loose stools yesterday; incontinence as well  Weight loss:  No  Urine output:  Normal voiding  Urine Character:  Clear yellow  Signs of dehydration:  Weakness  Precipitating factors:  Chemotherapy treatment.  Onset:  Started Monday 11/25/24 after an episode of constipation. Patient used laxatives to help her have a bm this weekend. Stopped laxatives after having bm. She now has uncontrollable diarrhea and periods of incontinence.  Note: she just finished amoxicillin   Duration:    Relieving factors:  Lomotil  gives minimal relief.  Associated symptoms:  Abdominal cramps, excessive gas  Changes in ADLs:  No     Diet History  Food intolerance:  none  Food aversions:  none  Food allergies:  none  Consumption of well water :  No  Ingestion  of unpasteurized milk or milk products:  No  Consumption of raw seafood:  No  Recent travel abroad:  No  Exposure to farm animals or animal feces:  No    Triage Nurse Guidance Signs and Symptoms Action  Grossly bloody stool   Signs of severe dehydration  Severe lethargy or weakness  Heart palpitations  Decreased urine output  Sunken eyes  Orthostatic hypotension  Dizziness   Temperature higher than 100.4 F WITH suspect neutropenia   Symptoms of immune-mediated colitis, such as diarrhea accompanied by blood in the stool, severe abdominal pain, or tenderness   Symptoms of bowel perforation, such as diarrhea associated with severe abdominal pain, fever chills, nausea, and vomiting  Seek emergency care.  Call an ambulance immediately.   Excessive thirst, dry mouth  Temperature higher than 100.4 F WITHOUT suspect neutropenia  Diarrhea for more than five (5) days  More than four to six stools above baseline per day for two days  Swollen or painful abdomen  More than 10 stools per day  Weight loss of more than five (5) pounds since diarrhea began  Continued diarrhea despite antidiarrheal treatment  Decreased turgor; pinched skin does not spring back  Grade 3 or 4 nausea and vomiting  Seek urgent care within 24 hours.   Less than four (4) stools per day  Chronic diarrhea  Other family members with diarrhea  Recent travel to a foreign country  New prescription  Follow homecare instructions.  Notify MD  if no improvement.     Patient Instruction  Disclaimer:  Patient specific and [employer] instruction provided verbally and sent through MyChart for active users.    Consider food and fluid modifications: Drink 8-10 eight-ounce glasses of fluids per day (e.g., water , diluted cranberry juice, sports drinks, decaffeinated tea or coffee). Eat foods high in soluble fiber (e.g., bananas, oatmeal, applesauce, skinned turkey, chicken, rice, toast). Consider foods containing pectin, a natural  fiber that decreases diarrhea. These foods include beets, peeled apples, white rice, bananas, baked potatoes without skin, white bread, plain pasta, avocadoes, and asparagus tips. Eat easy-to-digest foods high in protein, calories, and potassium. - Cook all vegetables well. Raw vegetables are difficult to digest. - Eat small, frequent meals. Do not eat large meals. - Eat foods at room temperature, as hot and cold temperature foods may insti-gate diarrhea. Avoid foods high in insoluble fiber (e.g., raw fruits and vegetables, skins, seeds, legumes). Avoid milk and dairy products, caffeine , alcohol, sucrose, and sorbitol. - Avoid greasy, fatty, spicy, or fried foods and foods containing olestra. - Refrain from taking fiber supplements.  Implement a rectal skin care routine: Clean the perineal area well with mild soap and water  or aloe-based baby wipes. Apply barrier ointment (e.g., zinc oxide) for protection. Sitz baths may add comfort.  Examine the rectal area for red, scaly, or broken skin. If this is present, report it to the healthcare provider. Record the frequency, quality, and volume of stools during the course of treatment.  If diarrhea lasts more than 24 hours, notify the healthcare provider.  Also, consult a healthcare provider before taking any over-the-counter antidiarrheal medications. These can be very effective but may not be appropriate for this situation.  If treatment with immunotherapy or targeted therapies has been prescribed, consultation with the healthcare team should begin prior to starting antidiarrheal.  If prescribed, keep track of medications administered--type, amount, and frequency.  Report the Following Problems: Inability to keep fluids down for 24 hours Diarrhea lasting more than 24 hours Dark yellow urine or absence of urine production More than four (4) to six (6) bowel movements above baseline per day for two days in a row Grade 2 or higher nausea and vomiting  that accompanies diarrhea Dizziness Rectal bleeding Temperature greater than 100.4 F Swollen or painful abdomen Red, scaly, or broken skin of the rectal area  Teach Back Method used:  Yes    History of the problem:  Fatigue Severity: 0-10 scale with 0 as not severe and 10 as unimaginably severe 4 - 6 = Moderate 6  Onset: Over the last week; progressive weakness.   Duration: Since start of all her treatment.  Relieving factors: Laying down  Intensifying factors: Dizziness when standing  Changes in ADLs: none  Associated symptoms: Sleep disturbances, (difficulty sleeping currently on trazadone 50 mg and ativan  and klonopin ) and Diarrhea     Additional commentary:     Triage Nurse Guidance  Signs and Symptoms Action  Unable to wake up Seek emergency care. Call an ambulance immediately.  Severe fatigue that is disabling; patient is bedridden. Temperature higher than 100.21F (38C) with suspected neutropenia Adverse reaction to psychostimulant (e.g., methylpheni-date, modafinil, prednisone, dexamethasone ) Seek emergency care.   Severe fatigue or loss of ability to perform some activities Dizziness Temperature higher than 100.21F (38C) without sus-pected neutropenia Schedule office visit in 24-48 hours.   Moderate fatigue or difficulty performing some activities of daily living Follow homecare instruc-tions. Notify MD if no improvement.  Increased fatigue over baseline  but not altering daily life-style Follow homecare instruc-tions. Notify MD if no improvement.     Patient Instruction  Disclaimer:  Patient specific and [employer] instruction provided verbally and sent through MyChart for active users.    Report the Following Problems: Blood in urine or stool Weight loss Fever (temperature above the normal or baseline temperature) without neutropenia Inability to perform activities of daily living Inability to conceptualize thoughts  Seek Emergency Care Immediately if Any  of the Following Occurs: Fainting Unconsciousness Temperature higher than 100.33F (38C) with suspected neutropenia  Participate in the appropriate and optimal physical activity/exercise for the individual: Start slowly (e.g., walking, gentle stretching, yoga). Try short periods of exercise with frequent rest breaks. Avoid uneven surfaces. Avoid activity that puts you at risk for falls or injury. Aim for daily physical activity.  If able, include endurance activities (e.g., walking, swimming, cycling) and resis-tance training (e.g., weights, resistance bands).  Seek a physical therapist or exercise specialist for assessment and an exercise prescription.  Exercise is recommended with caution in patients with the following:  Bone metastases Thrombocytopenia Anemia Fever or active infection Physical limitations secondary to metastases or comorbid conditions Safety issues (risk of falls)  Manage stress through various means: In-person or online support groups Expressive writing, journaling Supportive counseling Relaxation breathing Progressive muscle relaxation Mindfulness-based stress reduction Meditation  Practice energy conservation: Prioritize important tasks and eliminate or delegate others. Keep frequently used items within reach.  Do not stand too long. Sit when preparing meals, washing dishes, ironing, etc.  Alternate periods of activity and rest throughout the day.  Plan ahead, as rushing uses energy.  Keep an activity journal for a few weeks to identify patterns of energy and fatigue. Use adaptive devices such as a jar opener, reaching or grabbing tool, shower chair, or bedside commode.  Optimize sleep quality: Cognitive behavioral therapy can address negative thought patterns and behav-iors that interfere with sleep. Sleep restriction: Match the time in bed with sleep requirements.  Avoid caffeine  at least six to eight hours before bedtime.  Stop smoking.  Avoid  alcohol and high-sugar foods in the evening.  Use the bedroom only for sleep and sex.  Limit daytime naps to 30 minutes.  Establish a routine prior to sleep that promotes relaxation.  Create a conducive sleep environment (dark, quiet, and comfortable).  Avoid gaming, watching TV, and using a computer or cell phone late at night.  Encourage a balanced diet and adequate intake of fluids, electrolytes, calories, protein, carbohydrates, fat, vitamins, and minerals.  Teach Back Method used:  Yes       History of the problem:  Cough Attempt to obtain patient reported VS: Home Pulse Oximetry:  unable to check, Respiratory rate:  18, Heart Rate:  98, Blood Pressure:  106/55-sitting; does not have BP cough available to check orthostatics.  Adventitious respiratory sounds heard by phone: none  Associated symptoms: Non-productive cough dry cough  Patient reported cyanosis or pallor: No  Timing of symptoms: Chronic cough  Aggravating factors:   Are these acute or chronic symptoms: chronic  Potential environmental irritants: none  Potential exposure to illness: none  Changes in ADLs: none     Smoking History: Never smoker     Additional commentary: H/o pneumonia      Triage Nurse Guidance Signs and Symptoms Action  Sudden, unexpected increase in dyspnea at rest Chest pain Frothy pink sputum or gross hemoptysis Facial swelling Change in mental status Seek emergency care immediately.   Increasing dyspnea with  activity Non-neutropenic fever Increased edema or swelling Change in cough or sputum production Uncontrollable cough Wheezing, rhonchi, or crackles Seek medical care within 24 hours.   Subacute or chronic cough, lasting more than three weeks Follow homecare instructions and seek medical care if no improvement within 24-48 hours.     Patient Instruction  Disclaimer:  Patient specific and [employer] instruction provided verbally and sent through MyChart for active users.     Comply with medical therapy (e.g., antibiotics, antitussives, bronchodilators, opioids, proton pump inhibitors), respiratory treatment, and oxygen, as prescribed. Drink plenty (1-2 L) of fluids (unless restricted because of cardiac dysfunction or kidney disease) to help thin out secretions (unless underlying congestive heart failure is present). Avoid precipitating factors that worsen cough (e.g., perfumes, tobacco smoke, cold or dry air). Consider use of a warm humidifier (cold humidified air may lead to broncho-spasm). Monitor for fever or any sign of infection, and avoid contact with individuals who are sick.  Report the Following Problems: Fever (temperature higher than 101.52F [38.6C], or 100.62F [38.1C] if receiving chemotherapy) Change in sputum production (color, hemoptysis)  Swelling of the feet or hands  Unrelieved heartburn symptoms, if applicable  Seek Emergency Care Immediately if Any of the Following Occurs: Worsening dyspnea, especially if accompanied by chest pain or gross hemoptysis  Increased work of breathing (elevated respiratory or heart rate)  Teach Back Method used:  Yes      Provider Consulted  Provider name and credentials: Dr. Babara  Provider instruction: Dr. Babara will see patient today rather than tomorrow and will address pt's concerns at the appointment.     Nurse Triage Priority:  Urgent (obtain medical orders as indicated with instruction for 8-24 hour or sooner follow-up)  Barriers to Care:  None identified  Nurse Triage Disposition:  In-person follow-up visit:  Dr. Babara will see patient today rather than tomorrow and will address pt's concerns at the appointment.   Protocol Source:  Ruthine HERO., & Eldonna CANDIE Pee.). (2019). Telephone triage for oncology nurses (3rd ed.). Oncology Nursing Society.

## 2024-11-27 NOTE — Assessment & Plan Note (Signed)
 Encourage oral hydration.  Imodium  as needed.  Recent antibiotics use check stool panel and C. difficile.

## 2024-11-27 NOTE — Assessment & Plan Note (Signed)
 Continue home antiemetics phernegan and compazine  PRN.  Added lorazepam  0.5 every 12 hours as needed anxiety and nausea.   IV Zofran  4 mg and IV dexamethasone  10 mg today.

## 2024-11-27 NOTE — Assessment & Plan Note (Signed)
 Continue Lovenox 1 mg/kg twice daily

## 2024-11-27 NOTE — Assessment & Plan Note (Signed)
 Poor dentition.  Encouraged patient to seek dental care. Add Peridex  oral solution twice daily Recommend local Orajel for pain control.  She may also use over-the-counter Tylenol  as needed. S/p amoxicillin  500 mg twice daily for 5 days while waiting for dental evaluation. Recommend checking CBC prior to dental extraction

## 2024-11-27 NOTE — Assessment & Plan Note (Signed)
 She receives Day 3 long acting GCSF Neutropenia precaution.

## 2024-11-27 NOTE — Telephone Encounter (Signed)
 Per secure chat, move appts from tomorrow to today. I called and spoke with pt and confirmed the appts for today and the times.

## 2024-11-28 ENCOUNTER — Inpatient Hospital Stay

## 2024-11-28 ENCOUNTER — Inpatient Hospital Stay: Admitting: Oncology

## 2024-11-29 ENCOUNTER — Encounter: Payer: Self-pay | Admitting: Oncology
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
## 2024-12-01 ENCOUNTER — Encounter: Payer: Self-pay | Admitting: Oncology

## 2024-12-02 ENCOUNTER — Telehealth: Payer: Self-pay | Admitting: *Deleted

## 2024-12-02 ENCOUNTER — Inpatient Hospital Stay

## 2024-12-02 ENCOUNTER — Encounter: Payer: Self-pay | Admitting: Hospice and Palliative Medicine

## 2024-12-02 ENCOUNTER — Encounter: Payer: Self-pay | Admitting: Oncology

## 2024-12-02 ENCOUNTER — Inpatient Hospital Stay: Admitting: Hospice and Palliative Medicine

## 2024-12-02 ENCOUNTER — Other Ambulatory Visit: Payer: Self-pay

## 2024-12-02 DIAGNOSIS — C210 Malignant neoplasm of anus, unspecified: Secondary | ICD-10-CM

## 2024-12-02 DIAGNOSIS — R109 Unspecified abdominal pain: Secondary | ICD-10-CM

## 2024-12-02 DIAGNOSIS — N179 Acute kidney failure, unspecified: Secondary | ICD-10-CM

## 2024-12-02 DIAGNOSIS — R197 Diarrhea, unspecified: Secondary | ICD-10-CM

## 2024-12-02 DIAGNOSIS — R630 Anorexia: Secondary | ICD-10-CM

## 2024-12-02 DIAGNOSIS — J069 Acute upper respiratory infection, unspecified: Secondary | ICD-10-CM

## 2024-12-02 DIAGNOSIS — C50919 Malignant neoplasm of unspecified site of unspecified female breast: Secondary | ICD-10-CM

## 2024-12-02 LAB — CBC WITH DIFFERENTIAL (CANCER CENTER ONLY)
Abs Immature Granulocytes: 0.82 K/uL — ABNORMAL HIGH (ref 0.00–0.07)
Basophils Absolute: 0.1 K/uL (ref 0.0–0.1)
Basophils Relative: 1 %
Eosinophils Absolute: 0 K/uL (ref 0.0–0.5)
Eosinophils Relative: 1 %
HCT: 28.5 % — ABNORMAL LOW (ref 36.0–46.0)
Hemoglobin: 9.1 g/dL — ABNORMAL LOW (ref 12.0–15.0)
Immature Granulocytes: 14 %
Lymphocytes Relative: 7 %
Lymphs Abs: 0.4 K/uL — ABNORMAL LOW (ref 0.7–4.0)
MCH: 31.9 pg (ref 26.0–34.0)
MCHC: 31.9 g/dL (ref 30.0–36.0)
MCV: 100 fL (ref 80.0–100.0)
Monocytes Absolute: 0.5 K/uL (ref 0.1–1.0)
Monocytes Relative: 9 %
Neutro Abs: 4 K/uL (ref 1.7–7.7)
Neutrophils Relative %: 68 %
Platelet Count: 97 K/uL — ABNORMAL LOW (ref 150–400)
RBC: 2.85 MIL/uL — ABNORMAL LOW (ref 3.87–5.11)
RDW: 15 % (ref 11.5–15.5)
WBC Count: 5.8 K/uL (ref 4.0–10.5)
nRBC: 0.5 % — ABNORMAL HIGH (ref 0.0–0.2)

## 2024-12-02 LAB — RESPIRATORY PANEL BY PCR

## 2024-12-02 LAB — CMP (CANCER CENTER ONLY)
ALT: 20 U/L (ref 0–44)
AST: 18 U/L (ref 15–41)
Albumin: 3.8 g/dL (ref 3.5–5.0)
Alkaline Phosphatase: 74 U/L (ref 38–126)
Anion gap: 12 (ref 5–15)
BUN: 7 mg/dL (ref 6–20)
CO2: 20 mmol/L — ABNORMAL LOW (ref 22–32)
Calcium: 9.2 mg/dL (ref 8.9–10.3)
Chloride: 106 mmol/L (ref 98–111)
Creatinine: 0.79 mg/dL (ref 0.44–1.00)
GFR, Estimated: >60 mL/min
Glucose, Bld: 142 mg/dL — ABNORMAL HIGH (ref 70–99)
Potassium: 3.6 mmol/L (ref 3.5–5.1)
Sodium: 138 mmol/L (ref 135–145)
Total Bilirubin: 0.2 mg/dL (ref 0.0–1.2)
Total Protein: 6.3 g/dL — ABNORMAL LOW (ref 6.5–8.1)

## 2024-12-02 LAB — MAGNESIUM: Magnesium: 2.1 mg/dL (ref 1.7–2.4)

## 2024-12-02 MED ORDER — SODIUM CHLORIDE 0.9 % IV SOLN
INTRAVENOUS | Status: DC
  Filled 2024-12-02 (×2): qty 250

## 2024-12-02 NOTE — Telephone Encounter (Signed)
 I will call to triage

## 2024-12-02 NOTE — Telephone Encounter (Signed)
 See triage note.

## 2024-12-02 NOTE — Telephone Encounter (Signed)
 Caller verified using pt's full name and dob prior to discussing PHI. Outgoing call to pt to f/u on her mychart msgs.  Patient has multiple complaints: diarrhea, abdominal pain, low grade fever, sore throat- see RN assessments below.      NURSING SYMPTOM TRIAGE ASSESSMENT & DISPOSITION  Mode of interaction:  Telephone Date of symptom triage interaction:  12/02/24 Time of symptom triage interaction:  1000  Cancer diagnosis:  Anal squamous cell carcinoma (HCC)  AKI (acute kidney injury)  Invasive carcinoma of breast (HCC) Current anti-cancer treatment:  Patient is on Treatment Plan :  BREAST DOSE DENSE AC q14d / PACLitaxel q7d  Last treatment date:  11/20/2024  Oral chemotherapy:  No  Symptom Triage Protocol:  Diarrhea  History of the problem:  Diarrhea Frequency of stools:  Bowel incontinence; patient going from constipation to diarrhea  Consistency:  Liquid  Color:  brown  Odor:  no  Undigested food or fat:  No  Mucous present:  No  Blood present:  No  Number of stools in past 24 hours:  8 loose stools; none this morning. Notes explosive loose stools to the point of incontinence.  Prior to this I had not been to bathroom until this past Sunday. About 3pm on Sunday, I started to have a BM. Then I went 8 times and it was completely watery.  Weight loss:  No  Urine output:  Normal voiding  Urine Character:  clear  Signs of dehydration:  Dry mouth  Precipitating factors:  Chemotherapy; flu like symptoms  Onset:  Ongoing since last apt with Dr. Babara. She was advised to encourage po fluid intake; take lomotil /imodium  as needed.   Due to recent antibiotic-amoxicillin  use she was instructed to collect stool for GI panel and C-diff. She has not collected these samples for the clinic.  Patient has been experiencing abdominal pain and constipation followed by episodes of watery diarrhea. She had a recent abd xray which showed a non-obstructive gas pattern.  Patient has remained  taking Linzess daily as well as lomotil  twice a day. I asked the patient to hold the Linzess given her amount of diarrhea she is currently experiencing     Duration:  Diarrhea since Sunday; but constipated prior to this.  Relieving factors:  Lomotil   Associated symptoms:  Abdominal bloating, Abdominal fullness, and Stool incontinence; appetite changes and fatigue. Patient requesting IV fluids this afternoon if possible. Pt currently scheduled for 12/04/24 apt for lab and fluids.   Changes in ADLs:  No     Diet History  Food intolerance:  none  Food aversions:  none  Food allergies:  no  Consumption of well water :  No  Ingestion of unpasteurized milk or milk products:  No  Consumption of raw seafood:  No  Recent travel abroad:  No  Exposure to farm animals or animal feces:  No    Triage Nurse Guidance Signs and Symptoms Action  Grossly bloody stool   Signs of severe dehydration  Severe lethargy or weakness  Heart palpitations  Decreased urine output  Sunken eyes  Orthostatic hypotension  Dizziness   Temperature higher than 100.4 F WITH suspect neutropenia   Symptoms of immune-mediated colitis, such as diarrhea accompanied by blood in the stool, severe abdominal pain, or tenderness   Symptoms of bowel perforation, such as diarrhea associated with severe abdominal pain, fever chills, nausea, and vomiting  Seek emergency care.  Call an ambulance immediately.   Excessive thirst, dry mouth  Temperature higher than 100.4  F WITHOUT suspect neutropenia  Diarrhea for more than five (5) days  More than four to six stools above baseline per day for two days  Swollen or painful abdomen  More than 10 stools per day  Weight loss of more than five (5) pounds since diarrhea began  Continued diarrhea despite antidiarrheal treatment  Decreased turgor; pinched skin does not spring back  Grade 3 or 4 nausea and vomiting  Seek urgent care within 24 hours.   Less than four (4) stools per day   Chronic diarrhea  Other family members with diarrhea  Recent travel to a foreign country  New prescription  Follow homecare instructions.  Notify MD if no improvement.     Patient Instruction  Disclaimer:  Patient specific and [employer] instruction provided verbally and sent through MyChart for active users.    Consider food and fluid modifications: Drink 8-10 eight-ounce glasses of fluids per day (e.g., water , diluted cranberry juice, sports drinks, decaffeinated tea or coffee). Eat foods high in soluble fiber (e.g., bananas, oatmeal, applesauce, skinned turkey, chicken, rice, toast). Consider foods containing pectin, a natural fiber that decreases diarrhea. These foods include beets, peeled apples, white rice, bananas, baked potatoes without skin, white bread, plain pasta, avocadoes, and asparagus tips. Eat easy-to-digest foods high in protein, calories, and potassium. - Cook all vegetables well. Raw vegetables are difficult to digest. - Eat small, frequent meals. Do not eat large meals. - Eat foods at room temperature, as hot and cold temperature foods may insti-gate diarrhea. Avoid foods high in insoluble fiber (e.g., raw fruits and vegetables, skins, seeds, legumes). Avoid milk and dairy products, caffeine , alcohol, sucrose, and sorbitol. - Avoid greasy, fatty, spicy, or fried foods and foods containing olestra. - Refrain from taking fiber supplements.  Implement a rectal skin care routine: Clean the perineal area well with mild soap and water  or aloe-based baby wipes. Apply barrier ointment (e.g., zinc oxide) for protection. Sitz baths may add comfort.  Examine the rectal area for red, scaly, or broken skin. If this is present, report it to the healthcare provider. Record the frequency, quality, and volume of stools during the course of treatment.  If diarrhea lasts more than 24 hours, notify the healthcare provider.  Also, consult a healthcare provider before taking any  over-the-counter antidiarrheal medications. These can be very effective but may not be appropriate for this situation.  If treatment with immunotherapy or targeted therapies has been prescribed, consultation with the healthcare team should begin prior to starting antidiarrheal.  If prescribed, keep track of medications administered--type, amount, and frequency.  Report the Following Problems: Inability to keep fluids down for 24 hours Diarrhea lasting more than 24 hours Dark yellow urine or absence of urine production More than four (4) to six (6) bowel movements above baseline per day for two days in a row Grade 2 or higher nausea and vomiting that accompanies diarrhea Dizziness Rectal bleeding Temperature greater than 100.4 F Swollen or painful abdomen Red, scaly, or broken skin of the rectal area  Teach Back Method used:  Yes    History of the problem:  Flu-Like Symptoms Current symptoms: Cough, non-productive, Poor appetite, Diarrhea, and Fatigue dry cough; Endores sore throat with low grade fever temp of 99.3  Onset: 1 week-started with a cough/mild congestion  Duration: Over a week  Current temperature: 99.3 Route taken:  tympanic  Relieving factors: Otc meds   Provoking factors: neutropenia  Changes in ADLs: none   Taking OTC-alka selser max and Dayquil  day and night.  Additional commentary:  Patient stated that she see pcp on Friday for management of URI symptoms and routine anxiety med mgmt. Pcp put pt on rx- doxycyline given URI symptoms  Patient also reports bright red bleeding from nose when blowing her nose. It's not gushing  Patient last had Udencya on 1/9.  Labs- 11/27/2024 ANC-0.8 Low   Plt count 83        Patient saw PCP on 1/16- URI panels- negative  Extended Respiratory Viral Panel - [facility] Specimen: Other - Swab of internal nose (specimen) Component Ref Range & Units 3 d ago  Influenza A PCR Negative Negative  Influenza B PCR Negative  Negative  RSV PCR Negative Negative  SARS-CoV2 PCR Negative Negative     Triage Nurse Guidance Severity Index  Temperature higher than 102F (38.9C) without suspected neutropenia Severe flu-like symptom cluster, limiting ability to carry out activities of daily living Change in mental status Seek emergency care.  Symptoms not relieved by current homecare instructions within 24 hours Seek urgent care within 24 hours.  If flu-like syndrome is expected from the current therapy Follow homecare instructions. Notify MD if no improvement.     Patient Instruction  Disclaimer:  Patient specific and [employer] instruction provided verbally and sent through MyChart for active users.    Fever (non-neutropenic) Use nonsteroidal anti-inflammatory drugs such as acetaminophen  or ibupro-fen unless contraindicated. Aspirin can also be used by adults if platelet count is above 50,000/mm3 Try tepid baths or ice packs as tolerated. Maintain hydration.  Chills For severe chills, a prescription for a narcotic such as meperidine  or hydro-morphone may be necessary. Use comfort measures such as warm clothing, blankets, or a hot water  bottle (use caution in areas treated with radiation therapy and risk of burns).  Myalgia and headache Use nonsteroidal anti-inflammatory drugs such as acetaminophen  or ibuprofen  unless contraindicated. Opioids and prednisone may be prescribed. Apply heat or cold on muscles. Apply a cool cloth on forehead. Rest in a quiet, dimly lit room. Apply heat and massage for headaches at the back of the head.  Malaise/fatigue Balance periods of activity with rest periods. Eat a balanced diet.  Cough Use an antitussive  such as codeine , dextromethorphan , or benzonatate . Use a decongestant if needed. Use an antihistamine if experiencing a runny nose.  Use a humidifier if experiencing a dry cough.  Seek Emergency Care Immediately if Any of the Following Occurs: Elevated  temperature for more than three days Vomiting Seizures Changes in vision Change in mental status  Teach Back Method used:  Yes     History of the problem:  Pain Pain Rating (0-10): Currently 7/10, on average 7-8/10, at worst 10/10, at best 7/10  Pain Location: Mid abdominal pain  Pain Quality: Sharp  Associated Pain Symptoms: Diarrhea/ constipation  When Did The Pain Start: Since last week- 1/16 I saw my pcp and had abdominal discomfort then.  Is the Pain Constant: Yes  Does the Pain Flare:   No  Does Anything Make the Pain Worse: Yes, abdominal cramps with my diarrhea  Does Anything Make the Pain Better:  No     Assess for Changes in ADLs: no     In the past, what treatments have you tried for your pain: Oxycodone , but I am out. I don't have any more  Did these treatments help: Yes  Additional commentary: I have calcium deposits in my abdomen that that is what is causing my pain.    Triage Nurse Guidance Severity  Index  Seek emergency care, call an ambulance immediately for: Signs/symptoms of acute injury, spinal cord compression, pathologic fracture, infection, or other life threatening problem Sudden onset of severe weakness or unrelenting localized pain Inability to ambulate or decreased sensation in extremities Loss of control of bowel or bladder Chest pain  Seek medical care within two to four hours for: Sudden onset of moderate to severe pain Pain not responsive to current medication regimen Pain that interferes with mobility  Seek medical care within twenty-four hours for: Mild to moderate pain that has been increasing Pain that is interfering with activity or sleep  Follow home care instructions for: Mild to moderate aches and pains Notify the provider if no improvement within twenty-four hours    Patient Instruction  Disclaimer:  Patient specific and [employer] instruction provided verbally and sent through MyChart for active users.    Report the following  problems No improvement in pain Pain that does not subside with interventions Other side effects, such as sedation, nausea, or constipation  Seek emergency care immediately if any of the following occur: Excruciating pain Immobility Low back pain associated with loss of bladder or bowel control; bilateral extremity weakness  Teach Back Method used:  Yes      Provider Consulted  Provider name and credentials: Dr. Babara and Fonda Mower, NP  Provider instruction: [facility] visit today     Nurse Triage Priority:  Urgent (obtain medical orders as indicated with instruction for 8-24 hour or sooner follow-up)  Barriers to Care:  None identified  Nurse Triage Disposition:  In-person follow-up visit:  apt today at 1pm for port labs/ - see Josh, NP at 130; possible IV fluids- cbc, metc, mag, GI panels and c-diff panels (orders already in- if patient collects specimen in clinic).  Protocol Source:  Ruthine HERO., & Eldonna CANDIE Pee.). (2019). Telephone triage for oncology nurses (3rd ed.). [employer] Nursing [employer].

## 2024-12-02 NOTE — Progress Notes (Signed)
 "  [facility] [phone] [phone]  Patient Care Team: Epifanio Alm SQUIBB, MD as PCP - General (Infectious Diseases) Maurie Rayfield BIRCH, RN as Oncology Nurse Navigator Babara Call, MD as Consulting Physician (Oncology) Lenn Aran, MD as Consulting Physician (Radiation Oncology) Georgina Shasta POUR, RN as Oncology Nurse Navigator   NAME OF PATIENT: Monica Gonzalez  [id number]  [birth year]   DATE OF VISIT: 12/02/24  REASON FOR CONSULT: Libia Fazzini Chauncey is a 49 y.o. 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    INTERVAL HISTORY: Patient saw Dr. Babara on 11/27/2024 and received cycle 2 ddAC with D3 G-CSF.  Plan was to delay next cycle of chemotherapy about 1 week due to toxicities from chemotherapy.  Recent diagnosis of PE.  Patient is on Lovenox  twice daily.  She saw her PCP Dr. Epifanio on 11/29/2024.  Patient had complaints of alternating diarrhea and constipation.  She also had URI symptoms and low-grade fever at home.  Patient was started on doxycycline.  Patient presents to [facility] today with multiple complaints including rhinorrhea, sore throat.  Also endorses 6 days of constipation.  She had a firm stool yesterday and then has had some loose stools afterwards.  Denies fever or chills.  Says she is taking her doxycycline.  Oral intake is reportedly poor.  Patient says she feels dehydrated.  Denies any neurologic complaints. Denies any easy bleeding or bruising.  Denies shortness of breath chest pain.  Denies urinary complaints. Patient offers no further specific complaints today.   PAST MEDICAL HISTORY: Past Medical History:  Diagnosis Date   Anemia    Anginal pain    Anxiety    Arthritis    Asthma    Cancer (HCC)    rectal cancer, breast cancer 2025   Chronic pain  syndrome 05/21/2023   DDD (degenerative disc disease), cervical 05/22/2023   Deep venous thrombosis (HCC)    Depression    Dyspnea    Fibromyalgia    GERD (gastroesophageal reflux disease)    Headache    Heart murmur    History of hiatal hernia    History of kidney stones    Lupus    Malignant neoplasm of upper-outer quadrant of right female breast, unspecified estrogen receptor status (HCC) 2025   Pneumonia    PTSD (post-traumatic stress disorder)    Skin candidiasis 05/29/2024   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    PAST SURGICAL HISTORY:  Past Surgical History:  Procedure Laterality Date   BREAST BIOPSY Right 06/18/2024   US  RT BREAST BX W LOC DEV 1ST LESION IMG BX SPEC US  GUIDE 06/18/2024 [facility]   BREAST BIOPSY Right 06/18/2024   US  RT BREAST BX W LOC DEV EA ADD LESION IMG BX SPEC US  GUIDE 06/18/2024 [facility]   BREAST BIOPSY Right 06/18/2024   US  RT BREAST BX W LOC DEV EA ADD LESION IMG BX SPEC US  GUIDE 06/18/2024 [facility]   BREAST BIOPSY Right 07/05/2024   US  RT BREAST SAVI/RF TAG 1ST LESION US  GUIDE 07/05/2024 [facility]   BREAST BIOPSY Right 07/05/2024   US  RT BREAST SAVI/RF TAG EA ADD'L LESION US  GUIDE 07/05/2024 [facility]   BREAST LUMPECTOMY Right 07/25/2024   Procedure: BREAST LUMPECTOMY;  Surgeon: Tye Millet, DO;  Location: [facility];  Service: General;  Laterality: Right;   BREAST LUMPECTOMY WITH RADIO  FREQUENCY LOCALIZER Right 07/08/2024   Procedure: BREAST LUMPECTOMY WITH RADIO FREQUENCY LOCALIZER;  Surgeon: Tye Millet, DO;  Location: [facility];  Service: General;  Laterality: Right;   COLONOSCOPY WITH PROPOFOL  N/A 02/17/2023   Procedure: COLONOSCOPY WITH PROPOFOL ;  Surgeon: Maryruth Ole DASEN, MD;  Location: [facility];  Service: Endoscopy;  Laterality: N/A;   ESOPHAGOGASTRODUODENOSCOPY (EGD) WITH PROPOFOL  N/A 02/17/2023   Procedure: ESOPHAGOGASTRODUODENOSCOPY (EGD) WITH PROPOFOL ;  Surgeon: Maryruth Ole DASEN, MD;  Location: [facility];  Service: Endoscopy;  Laterality: N/A;   EXTRACORPOREAL SHOCK WAVE LITHOTRIPSY     IR CV LINE INJECTION  08/21/2024   IR REMOVAL TUN ACCESS W/ PORT W/O FL MOD SED  08/28/2024   PICC LINE INSERTION N/A 08/2024   PORTA CATH INSERTION N/A 10/14/2024   Procedure: PORTA CATH INSERTION;  Surgeon: Marea Selinda RAMAN, MD;  Location: [facility];  Service: Cardiovascular;  Laterality: N/A;   PORTA CATH REMOVAL N/A 10/14/2024   Procedure: PORTA CATH REMOVAL;  Surgeon: Marea Selinda RAMAN, MD;  Location: [facility];  Service: Cardiovascular;  Laterality: N/A;   PORTACATH PLACEMENT Right 07/08/2024   Procedure: INSERTION, TUNNELED CENTRAL VENOUS DEVICE, WITH PORT;  Surgeon: Tye Millet, DO;  Location: [facility];  Service: General;  Laterality: Right;   RECTAL EXAM UNDER ANESTHESIA N/A 05/16/2024   Procedure: EXAM UNDER ANESTHESIA, RECTUM, INCISIONAL BIOPSY OF ANAL MASS;  Surgeon: Tye Millet, DO;  Location: [facility];  Service: General;  Laterality: N/A;   SENTINEL NODE BIOPSY Right 07/08/2024   Procedure: BIOPSY, LYMPH NODE, SENTINEL;  Surgeon: Tye Millet, DO;  Location: [facility];  Service: General;  Laterality: Right;   TONSILLECTOMY     TUBAL LIGATION     VULVECTOMY N/A 07/29/2020   Procedure: WIDE EXCISION VULVECTOMY;  Surgeon: Mancil Barter, MD;  Location: [facility];  Service: Gynecology;  Laterality: N/A;    HEMATOLOGY/ONCOLOGY HISTORY:  Oncology History  Anal squamous cell carcinoma (HCC)  05/29/2024 Initial Diagnosis   Anal squamous cell carcinoma   Patient has noticed a hard mass at the anal area which she initially thought was a hemorrhoid.  The mass has been present for more than a month and appears to be enlarging. Patient was referred to establish care with surgeon Dr. Tye  05/16/2024, incisional biopsy of anal mass showed 1. Anus, biopsy, mass :       - INVASIVE MODERATELY DIFFERENTIATED SQUAMOUS CELL CARCINOMA  06/19/2024, left  inguinal adenopathy biopsy showed Metastatic moderately differentiated squamous cell carcinoma.  '    05/29/2024 Cancer Staging   Staging form: Anus, AJCC V9 - Clinical stage from 05/29/2024: Stage IIIA (cT3, cN1a, cM0) - Signed by Babara Call, MD on 06/26/2024 Stage prefix: Initial diagnosis   06/05/2024 Imaging   CT chest abdomen pelvis with contrast  Slight thickening in the area of the anal canal. Please correlate for history of neoplasm. There is a pathologic enlarged left inguinal node. There is a borderline right-sided node.   No additional areas of abnormal nodal enlargement or other aggressive appearing mass lesion at this time.   Fatty liver infiltration with known segment 7 hepatic hemangioma.   Of note there is a right-sided upper breast mass which was not clearly seen on prior CT scan. Please correlate for any prior study or if needed diagnostic mammographic evaluation and possible ultrasound when appropriate.     06/12/2024 Imaging   CT chest abdomen pelvis with contrast showed Slight thickening in the area of the anal canal. Please correlate  for history of neoplasm. There is a pathologic enlarged left inguinal node. There is a borderline right-sided node.   No additional areas of abnormal nodal enlargement or other aggressive appearing mass lesion at this time.   Fatty liver infiltration with known segment 7 hepatic hemangioma.   Of note there is a right-sided upper breast mass which was not clearly seen on prior CT scan. Please correlate for any prior study or if needed diagnostic mammographic evaluation and possible ultrasound when appropriate.   06/19/2024 Procedure   1. Lymph node, biopsy, left inguinal adenopathy :       -  METASTATIC MODERATELY DIFFERENTIATED SQUAMOUS CELL CARCINOMA    07/01/2024 Imaging   PET scan showed 1. Hypermetabolic anal mass with hypermetabolic left inguinal adenopathy, compatible with anorectal carcinoma. 2. Hypermetabolic right  breast nodule with recent biopsies on 06/18/2024. Please correlate with pathology report.   07/31/2024 - 07/31/2024 Chemotherapy   Patient is on Treatment Plan : ANUS Mitomycin  D1,28 + 5FU D1-4, 28-31 q32d     08/23/2024 Genetic Testing   Negative genetic testing. No pathogenic variants identified on the [facility] CancerNext-Expanded+RNA Panel. The report date is 08/23/2024.  The CancerNext-Expanded gene panel offered by [facility] and includes sequencing, rearrangement, and RNA analysis for the following 77 genes: AIP, ALK, APC, ATM, AXIN2, BAP1, BARD1, BMPR1A, BRCA1, BRCA2, BRIP1, CDC73, CDH1, CDK4, CDKN1B, CDKN2A, CEBPA, CHEK2, CTNNA1, DDX41, DICER1, ETV6, FH, FLCN, GATA2, LZTR1, MAX, MBD4, MEN1, MET, MLH1, MSH2, MSH3, MSH6, MUTYH, NF1, NF2, NTHL1, PALB2, PHOX2B, PMS2, POT1, PRKAR1A, PTCH1, PTEN, RAD51C, RAD51D, RB1, RET, RPS20, RUNX1, SDHA, SDHAF2, SDHB, SDHC, SDHD, SMAD4, SMARCA4, SMARCB1, SMARCE1, STK11, SUFU, TMEM127, TP53, TSC1, TSC2, VHL, and WT1 (sequencing and deletion/duplication); EGFR, HOXB13, KIT, MITF, PDGFRA, POLD1, and POLE (sequencing only); EPCAM and GREM1 (deletion/duplication only).    08/29/2024 - 08/29/2024 Chemotherapy   Patient is on Treatment Plan : ANUS Mitomycin  D1,29 + Capecitabine  + XRT     Invasive carcinoma of breast (HCC)  06/12/2024 Imaging   CT chest abdomen pelvis with contrast showed Slight thickening in the area of the anal canal. Please correlate for history of neoplasm. There is a pathologic enlarged left inguinal node. There is a borderline right-sided node.   No additional areas of abnormal nodal enlargement or other aggressive appearing mass lesion at this time.   Fatty liver infiltration with known segment 7 hepatic hemangioma.   Of note there is a right-sided upper breast mass which was not clearly seen on prior CT scan. Please correlate for any prior study or if needed diagnostic mammographic evaluation and possible ultrasound when  appropriate.   06/17/2024 Mammogram   1. There is a suspicious 17 mm mass at the site of CT and mammographic concern in the RIGHT breast. It demonstrates associated pleomorphic calcifications. Recommend ultrasound-guided biopsy for definitive characterization. 2. There is incidental sonographic note of a 7 mm mass in the RIGHT breast at 12 o'clock and a 6 mm non mass area at 9 o'clock. Recommend ultrasound-guided biopsy of these 2 areas for definitive characterization given suspicious appearance of the dominant mass. 3. No suspicious RIGHT axillary adenopathy. 4. No mammographic evidence of malignancy in the LEFT breast.   06/18/2024 Cancer Staging   Staging form: Breast, AJCC 8th Edition - Clinical stage from 06/18/2024: Stage IB (cT1b, cN0, cM0, G3, ER-, PR-, HER2-) - Signed by Babara Call, MD on 06/26/2024 Stage prefix: Initial diagnosis Histologic grading system: 3 grade system   06/18/2024 Initial Diagnosis   Invasive carcinoma of breast (  [facility])  Patient with right breast biopsy.  Pathology showed 1. Breast, right, needle core biopsy, 12:00 12cmfn (heart clip) :      INVASIVE DUCTAL CARCINOMA      TUBULE FORMATION: SCORE 3      NUCLEAR PLEOMORPHISM: SCORE 3      MITOTIC COUNT: SCORE 2      TOTAL SCORE: 8      OVERALL GRADE: 3      LYMPHOVASCULAR INVASION: NOT IDENTIFIED      CANCER LENGTH: 1.0 CM      CALCIFICATIONS: NOT IDENTIFIED      OTHER FINDINGS: NONE      ER negative, PR negative, HER2 negative (IHC 0) Ki-67 95%.       2. Breast, right, needle core biopsy, 12:00 8cmfn (coil clip) :      INVASIVE DUCTAL CARCINOMA      DUCTAL CARCINOMA IN SITU, SOLID, HIGH NUCLEAR GRADE WITH NECROSIS      TUBULE FORMATION: SCORE 3      NUCLEAR PLEOMORPHISM: SCORE 3      MITOTIC COUNT: SCORE 2      TOTAL SCORE: 8      OVERALL GRADE: 3      LYMPHOVASCULAR INVASION: NOT IDENTIFIED      CANCER LENGTH: 0.3 CM      CALCIFICATIONS: NOT IDENTIFIED      OTHER FINDINGS: NONE            3. Breast,  right, needle core biopsy, 9:00 12cmfn (venus) :      BENIGN BREAST TISSUE WITH DENSE STROMAL FIBROSIS.      NEGATIVE FOR ATYPIA OR MALIGNANCY.    07/08/2024 Surgery   Patient went right breast lumpectomy and sentinel lymph node biopsy.  1. 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      - DUCTAL CARCINOMA IN SITU (DCIS).      - SEE CANCER SUMMARY AND NOTE BELOW.      - TWO BIOPSY SITES WITH CORRESPONDING HEART AND COIL CLIPS.      - TWO SAVI SCOUT TAGS.       2. Lymph node, sentinel, biopsy, Right :      - MICROMETASTATIC CARCINOMA INVOLVES ONE OF FIVE LYMPH NODES (1MI/5).      - SEE CANCER SUMMARY AND NOTE BELOW.       3. Breast, excision, Right medial posterior margin :      - BENIGN BREAST TISSUE.      - NEGATIVE FOR ATYPIA AND MALIGNANCY.      - SEE CANCER SUMMARY BELOW.   TUMOR Histologic Type: Invasive carcinoma of no special type (ductal) Histologic Grade (Nottingham Histologic Score) Glandular (Acinar)/Tubular Differentiation: 3 Nuclear Pleomorphism: 3 Mitotic Rate: 3 Overall Grade: 3 Tumor Size: Greatest dimension of largest invasive focus: 19 mm Ductal Carcinoma In Situ (DCIS): Present, high-grade with comedonecrosis Lymphatic and/or Vascular Invasion: Present, extensive (LVI in 2 or more blocks)  Treatment Effect in the Breast: No known presurgical therapy MARGINS Margin Status for Invasive Carcinoma: Invasive Carcinoma involves inferior (unifocal) and superior (unifocal) margins and is 0.5 mm to anterior margin Margin Status for DCIS: All margins negative for DCIS Distance from DCIS to closest margin: 0.5 mm Specify closest margin: Inferior REGIONAL LYMPH NODES Regional Lymph Node Status: Tumor present in regional lymph node(s) Number of Lymph Nodes with Macrometastases (greater than 2 mm): 0 Number of Lymph Nodes with Micrometastases (greater than 0.2  mm to 2 mm and/or greater than 200 cells):  1 Number of Lymph Nodes with Isolated Tumor Cells (0.2 mm or less OR 200 cells or less): 0 Size of Largest Metastatic Deposit: 1.9 mm Extranodal Extension: Not identified Total Number of Lymph Nodes Examined (sentinel and non-sentinel): 5 Number of Sentinel Nodes Examined: 5 DISTANT METASTASIS Distant Site(s) Involved, if applicable: Not applicable PATHOLOGIC STAGE CLASSIFICATION (pTNM, AJCC 8th Edition): Modified Classification: Not applicable pT Category: pT1c T Suffix: (m) multiple primary synchronous tumors in a single organ pN Category: pN35mi N Suffix: (sn) pM Category: Not applicable SPECIAL STUDIES Breast Biomarker Testing Performed on Previous Biopsy: [id number] (heart) Estrogen Receptor: Negative (0%) Progesterone Receptor: Negative (0%) HER2 IHC: Negative (0) Proliferation Marker Ki67: 95%   07/22/2024 Cancer Staging   Staging form: Breast, AJCC 8th Edition - Pathologic stage from 07/22/2024: Stage IB (pT1c, pN65mi(sn), cM0, G3, ER-, PR-, HER2-) - Signed by Babara Call, MD on 07/22/2024 Stage prefix: Initial diagnosis Method of lymph node assessment: Sentinel lymph node biopsy Multigene prognostic tests performed: None Histologic grading system: 3 grade system   07/25/2024 Surgery   Patient reports additional excision for positive margin.  1. Breast, excision, superior margin of right breast - RESIDUAL INVASIVE DUCTAL CARCINOMA, COMPLETELY EXCISED. - CARCINOMA PRESENT 2 MM FROM THE POSTERIOR MARGIN AND 4 MM FROM THE ANTERIOR AND SUPERIOR MARGINS. 2. Breast, excision, inferior margin of right breast - RESIDUAL INVASIVE DUCTAL CARCINOMA, COMPLETELY EXCISED. - CARCINOMA PRESENT 1 MM FROM THE NEW INFERIOR MARGIN.   08/23/2024 Genetic Testing   Negative genetic testing. No pathogenic variants identified on the [facility] CancerNext-Expanded+RNA Panel. The report date is 08/23/2024.  The CancerNext-Expanded gene panel offered by [facility] and includes sequencing, rearrangement,  and RNA analysis for the following 77 genes: AIP, ALK, APC, ATM, AXIN2, BAP1, BARD1, BMPR1A, BRCA1, BRCA2, BRIP1, CDC73, CDH1, CDK4, CDKN1B, CDKN2A, CEBPA, CHEK2, CTNNA1, DDX41, DICER1, ETV6, FH, FLCN, GATA2, LZTR1, MAX, MBD4, MEN1, MET, MLH1, MSH2, MSH3, MSH6, MUTYH, NF1, NF2, NTHL1, PALB2, PHOX2B, PMS2, POT1, PRKAR1A, PTCH1, PTEN, RAD51C, RAD51D, RB1, RET, RPS20, RUNX1, SDHA, SDHAF2, SDHB, SDHC, SDHD, SMAD4, SMARCA4, SMARCB1, SMARCE1, STK11, SUFU, TMEM127, TP53, TSC1, TSC2, VHL, and WT1 (sequencing and deletion/duplication); EGFR, HOXB13, KIT, MITF, PDGFRA, POLD1, and POLE (sequencing only); EPCAM and GREM1 (deletion/duplication only).    10/23/2024 -  Chemotherapy   Patient is on Treatment Plan : BREAST DOSE DENSE AC q14d / PACLitaxel q7d       ALLERGIES:  is allergic to prednisone and silicone.  MEDICATIONS:  Current Outpatient Medications  Medication Sig Dispense Refill   acetaminophen  (TYLENOL ) 500 MG tablet Take 500 mg by mouth every 6 (six) hours as needed.     albuterol  (VENTOLIN  HFA) 108 (90 Base) MCG/ACT inhaler Inhale 2 puffs into the lungs every 4 (four) hours as needed for wheezing or shortness of breath. 8.5 g 2   ascorbic acid (VITAMIN C) 500 MG tablet Take 500 mg by mouth daily.     azelastine  (ASTELIN ) 0.1 % nasal spray Place 1 spray into both nostrils 2 (two) times daily. Use in each nostril as directed 30 mL 0   BIOTIN PO Take by mouth daily.     budesonide  (PULMICORT ) 0.25 MG/2ML nebulizer solution Take 2 mLs (0.25 mg total) by nebulization 2 (two) times daily. 60 mL 12   busPIRone  (BUSPAR ) 5 MG tablet Take 5 mg by mouth 2 (two) times daily.     chlorhexidine  (PERIDEX ) 0.12 % solution Use as directed 10 mLs in the mouth  or throat 2 (two) times daily. 473 mL 0   cholecalciferol (VITAMIN D3) 25 MCG (1000 UNIT) tablet Take 1,000 Units by mouth daily.     clonazePAM  (KLONOPIN ) 0.5 MG tablet Take 0.5 mg by mouth at bedtime as needed for anxiety.     diphenoxylate -atropine   (LOMOTIL ) 2.5-0.025 MG tablet Take 1 tablet by mouth 4 (four) times daily. 90 tablet 0   doxycycline (VIBRAMYCIN) 100 MG capsule Take 100 mg by mouth 2 (two) times daily. for 7 days     DULoxetine  (CYMBALTA ) 30 MG capsule Take 30 mg by mouth at bedtime.     enoxaparin  (LOVENOX ) 80 MG/0.8ML injection Inject 0.8 mLs (80 mg total) into the skin every 12 (twelve) hours. 48 mL 2   esomeprazole  (NEXIUM ) 40 MG capsule Take 1 capsule (40 mg total) by mouth daily. 90 capsule 1   gabapentin  (NEURONTIN ) 300 MG capsule Take 2 capsules (600 mg total) by mouth 3 (three) times daily. 180 capsule 2   ipratropium-albuterol  (DUONEB) 0.5-2.5 (3) MG/3ML SOLN Take 3 mLs by nebulization 4 (four) times daily. 360 mL 3   lactose free nutrition (BOOST PLUS) LIQD Take 237 mLs by mouth 2 (two) times daily between meals. 237 mL 11   lidocaine -prilocaine  (EMLA ) cream Apply 1 Application topically as needed. 30 g 3   linaclotide (LINZESS) 145 MCG CAPS capsule Take 145 mcg by mouth daily before breakfast.     LORazepam  (ATIVAN ) 0.5 MG tablet Take 1 tablet (0.5 mg total) by mouth every 12 (twelve) hours as needed for anxiety (nausea). 30 tablet 0   megestrol  (MEGACE ) 40 MG tablet Take 2 tablets (80 mg total) by mouth 2 (two) times daily. 120 tablet 0   montelukast  (SINGULAIR ) 10 MG tablet Take 1 tablet (10 mg total) by mouth See admin instructions. Take 1 tablet on the day prior to chemotherapy and take 1 tablet daily for 2 days after chemotherapy. 30 tablet 0   Multiple Vitamin (MULTIVITAMIN WITH MINERALS) TABS tablet Take 1 tablet by mouth daily.     potassium chloride  SA (KLOR-CON  M) 20 MEQ tablet Take 1 tablet (20 mEq total) by mouth 2 (two) times daily. 60 tablet 0   prochlorperazine  (COMPAZINE ) 10 MG tablet Take 1 tablet (10 mg total) by mouth every 6 (six) hours as needed for nausea or vomiting. 30 tablet 1   promethazine  (PHENERGAN ) 25 MG tablet Take 1 tablet (25 mg total) by mouth every 8 (eight) hours as needed for nausea  or vomiting. 60 tablet 0   traZODone  (DESYREL ) 100 MG tablet Take 100 mg by mouth at bedtime.     benzonatate  (TESSALON ) 200 MG capsule Take 1 capsule (200 mg total) by mouth 3 (three) times daily as needed for cough. (Patient not taking: Reported on 12/02/2024) 30 capsule 1   dexamethasone  (DECADRON ) 4 MG tablet Take 2 tablets (8 mg total) by mouth daily for 2 days. Start the day after doxorubicin /cyclophosphamide  chemotherapy. Take with food. (Patient not taking: Reported on 12/02/2024) 30 tablet 1   ibuprofen  (ADVIL ) 200 MG tablet Take 200 mg by mouth every 6 (six) hours as needed. (Patient not taking: Reported on 12/02/2024)     naloxone  (NARCAN ) nasal spray 4 mg/0.1 mL SPRAY 1 SPRAY INTO ONE NOSTRIL AS DIRECTED FOR OPIOID OVERDOSE (TURN PERSON ON SIDE AFTER DOSE. IF NO RESPONSE IN 2-3 MINUTES OR PERSON RESPONDS BUT RELAPSES, REPEAT USING A NEW SPRAY DEVICE AND SPRAY INTO THE OTHER NOSTRIL. CALL 911 AFTER USE.) * EMERGENCY USE ONLY * (Patient  not taking: Reported on 12/02/2024) 1 each 0   nystatin  (MYCOSTATIN ) 100000 UNIT/ML suspension Take 5 mLs (500,000 Units total) by mouth 4 (four) times daily. Swish and swallow (Patient not taking: Reported on 12/02/2024) 473 mL 1   oxyCODONE  (OXY IR/ROXICODONE ) 5 MG immediate release tablet Take 1 tablet (5 mg total) by mouth every 6 (six) hours as needed for severe pain (pain score 7-10) or breakthrough pain. (Patient not taking: Reported on 12/02/2024) 15 tablet 0   No current facility-administered medications for this visit.    VITAL SIGNS: There were no vitals taken for this visit. There were no vitals filed for this visit.   Estimated body mass index is 36.86 kg/m as calculated from the following:   Height as of 10/16/24: 4' 11 (1.499 m).   Weight as of 11/27/24: 182 lb 8 oz (82.8 kg).  LABS: CBC:    Component Value Date/Time   WBC 1.2 (L) 11/27/2024 1441   WBC 2.3 (L) 09/13/2024 0500   HGB 9.6 (L) 11/27/2024 1441   HGB 14.1 03/06/2015 1307   HCT  30.0 (L) 11/27/2024 1441   HCT 42.4 03/06/2015 1307   PLT 83 (L) 11/27/2024 1441   PLT 252 03/06/2015 1307   MCV 101.7 (H) 11/27/2024 1441   MCV 90 03/06/2015 1307   NEUTROABS 0.8 (L) 11/27/2024 1441   NEUTROABS 4.8 03/06/2015 1307   LYMPHSABS 0.2 (L) 11/27/2024 1441   LYMPHSABS 0.5 (L) 03/06/2015 1307   MONOABS 0.1 11/27/2024 1441   MONOABS 0.4 03/06/2015 1307   EOSABS 0.0 11/27/2024 1441   EOSABS 0.0 03/06/2015 1307   BASOSABS 0.0 11/27/2024 1441   BASOSABS 0.1 03/06/2015 1307   Comprehensive Metabolic Panel:    Component Value Date/Time   NA 139 11/27/2024 1441   NA 141 03/06/2015 1307   K 4.0 11/27/2024 1441   K 3.2 (L) 03/06/2015 1307   CL 109 11/27/2024 1441   CL 107 03/06/2015 1307   CO2 20 (L) 11/27/2024 1441   CO2 25 03/06/2015 1307   BUN 9 11/27/2024 1441   BUN 5 (L) 03/06/2015 1307   CREATININE 0.61 11/27/2024 1441   CREATININE 0.89 03/06/2015 1307   GLUCOSE 95 11/27/2024 1441   GLUCOSE 83 03/06/2015 1307   CALCIUM 9.1 11/27/2024 1441   CALCIUM 8.8 (L) 03/06/2015 1307   AST 15 11/27/2024 1441   ALT 21 11/27/2024 1441   ALT 14 03/06/2015 1307   ALKPHOS 70 11/27/2024 1441   ALKPHOS 46 03/06/2015 1307   BILITOT 0.4 11/27/2024 1441   PROT 6.2 (L) 11/27/2024 1441   PROT 7.4 03/06/2015 1307   ALBUMIN  3.9 11/27/2024 1441   ALBUMIN  4.2 03/06/2015 1307    RADIOGRAPHIC STUDIES: DG Abd 2 Views Result Date: 11/27/2024 CLINICAL DATA:  Abdominal pain EXAM: ABDOMEN - 2 VIEW COMPARISON:  12/24/2018, CT 08/17/2024 FINDINGS: Central venous catheter tip in the right atrium. No free air beneath the diaphragm. Nonobstructed gas pattern. Phleboliths in the pelvis. IMPRESSION: Nonobstructed gas pattern. Electronically Signed   By: Luke Bun M.D.   On: 11/27/2024 17:33    PERFORMANCE STATUS (ECOG) : 1 - Symptomatic but completely ambulatory  Review of Systems Unless otherwise noted, a complete review of systems is negative.  Physical Exam General: NAD HEENT: Nasal  congestion, no exudate or thrush noted, no lymphadenopathy, Cardiovascular: regular rate and rhythm Pulmonary: clear anterior/posterior fields Abdomen: soft, nontender, + bowel sounds GU: no suprapubic tenderness Extremities: no edema, no joint deformities Skin: Warm and dry, no rashes Neurological:  Weakness but otherwise nonfocal  IMPRESSION/PLAN: Anal squamous cell carcinoma -status post Xeloda  and radiation  Breast cancer -on adjuvant ddAC chemotherapy  URI -continue doxycycline.  Discussed symptomatic treatment with OTC medications.  Suspect postnasal drip contributing to sore throat.  However, will check viral respiratory panel.  Constipation -recommended bowel regimen with MiraLAX .  No abdominal distention or pain.  Recent abdominal x-ray showed nonobstructive gas pattern.  Dehydration -no significant metabolic derangements on labs.  Proceed with IV fluids today per patient's request.  APP follow-up later this week and MD follow-up next week.  Case and plan discussed with Dr. Babara  Patient expressed understanding and was in agreement with this plan. She also understands that She can call clinic at any time with any questions, concerns, or complaints.   Thank you for allowing me to participate in the care of this very pleasant patient.   Time Total: 20 minutes  Visit consisted of counseling and education dealing with the complex and emotionally intense issues of symptom management in the setting of serious illness.Greater than 50%  of this time was spent counseling and coordinating care related to the above assessment and plan.  Signed by: Fonda Mower, PhD, NP-C     "

## 2024-12-02 NOTE — Telephone Encounter (Signed)
 Pt accepted [facility] apts at 1pm today for port lab/ [facility] apt at 130 pm and possible iv fluids.

## 2024-12-02 NOTE — Telephone Encounter (Signed)
 Monica Gonzalez,   I wasn't here on Friday, are you able to contact pt to triage please.

## 2024-12-03 ENCOUNTER — Encounter: Payer: Self-pay | Admitting: Hospice and Palliative Medicine

## 2024-12-03 ENCOUNTER — Encounter: Payer: Self-pay | Admitting: Pulmonary Disease

## 2024-12-03 ENCOUNTER — Other Ambulatory Visit: Payer: Self-pay

## 2024-12-03 ENCOUNTER — Ambulatory Visit: Admitting: Pulmonary Disease

## 2024-12-03 VITALS — BP 108/64 | HR 101 | Temp 98.9°F | Ht 59.0 in | Wt 186.0 lb

## 2024-12-03 DIAGNOSIS — K219 Gastro-esophageal reflux disease without esophagitis: Secondary | ICD-10-CM | POA: Diagnosis not present

## 2024-12-03 DIAGNOSIS — D709 Neutropenia, unspecified: Secondary | ICD-10-CM

## 2024-12-03 DIAGNOSIS — J069 Acute upper respiratory infection, unspecified: Secondary | ICD-10-CM

## 2024-12-03 DIAGNOSIS — B370 Candidal stomatitis: Secondary | ICD-10-CM | POA: Diagnosis not present

## 2024-12-03 DIAGNOSIS — C50919 Malignant neoplasm of unspecified site of unspecified female breast: Secondary | ICD-10-CM

## 2024-12-03 DIAGNOSIS — Z87891 Personal history of nicotine dependence: Secondary | ICD-10-CM

## 2024-12-03 DIAGNOSIS — C210 Malignant neoplasm of anus, unspecified: Secondary | ICD-10-CM

## 2024-12-03 DIAGNOSIS — J209 Acute bronchitis, unspecified: Secondary | ICD-10-CM

## 2024-12-03 MED ORDER — FLUCONAZOLE 100 MG PO TABS: 100.0000 mg | ORAL_TABLET | Freq: Every day | ORAL | 0 refills | Status: AC

## 2024-12-03 MED ORDER — CHLORHEXIDINE GLUCONATE 0.12 % MT SOLN: 10.0000 mL | Freq: Two times a day (BID) | OROMUCOSAL | 0 refills | Status: AC

## 2024-12-03 MED ORDER — NYSTATIN 100000 UNIT/ML MT SUSP: 5.0000 mL | Freq: Four times a day (QID) | OROMUCOSAL | 1 refills | Status: AC

## 2024-12-03 NOTE — Progress Notes (Unsigned)
 "  Subjective:    Patient ID: Monica Gonzalez, female    DOB: [birth year], 49 y.o.   MRN: [id number]  Patient Care Team: Epifanio Alm SQUIBB, MD as PCP - General (Infectious Diseases) Maurie Rayfield BIRCH, RN as Oncology Nurse Navigator Babara Call, MD as Consulting Physician (Oncology) Lenn Aran, MD as Consulting Physician (Radiation Oncology) Georgina Shasta POUR, RN as Oncology Nurse Navigator  Chief Complaint  Patient presents with   URI    PCP dx patient with URI.  Rx Doxycycline 11/29/2024.  Still c/o cough and sore throat.    BACKGROUND/INTERVAL:Patient is a 49 year old lifelong never smoker with a history as noted below, who presents for follow-up of shortness of breath and cough with occasional wheezing.  She was initially seen here on 08 August 2024.   HPI   Review of Systems A 10 point review of systems was performed and it is as noted above otherwise negative.   Patient Active Problem List   Diagnosis Date Noted   Abdominal pain 11/27/2024   Thrombocytopenia 11/27/2024   Pain, dental 11/20/2024   Poor appetite 11/20/2024   Dysuria 10/30/2024   Port-A-Cath in place 10/23/2024   Cancer associated pain 10/21/2024   Mucositis due to chemotherapy 10/21/2024   Sepsis due to multifocal pneumonia (HCC) 09/07/2024   Acute respiratory failure with hypoxia (HCC) 09/07/2024   Acute on chronic anemia 09/07/2024   Chemotherapy induced neutropenia 09/07/2024   UTI (urinary tract infection) 08/31/2024   Pulmonary embolism (HCC) 08/31/2024   Obesity, Class III, BMI 40-49.9 (morbid obesity) (HCC) 08/31/2024   Anxiety and depression 08/31/2024   Elevated LFTs 08/29/2024   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   Genetic testing 08/27/2024   Diarrhea 08/19/2024   AKI (acute kidney injury) 08/15/2024   Chemotherapy-induced nausea 08/15/2024   Chemotherapy induced diarrhea 08/15/2024   Hypokalemia 07/31/2024   Constipation 07/31/2024   Acute dyspnea 07/29/2024    Acute subsegmental pulmonary embolism (HCC) 07/29/2024   History of PE, 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) 07/29/2024   Anxiety    Deep venous thrombosis (HCC) 07/23/2024   Invasive carcinoma of breast (HCC) 06/26/2024   Breast mass in female 06/13/2024   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   Skin candidiasis 05/29/2024   Painful cervical range of motion 07/24/2023   Impaired range of motion of cervical spine 07/24/2023   History of Prednisone Allergy 07/04/2023    Class: History of   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, C5-6) 06/07/2023   Cervical sensory radiculopathy (Left) 06/07/2023   Chronic upper extremity pain (Left) 06/07/2023   Shoulder radicular pain (Left) 06/07/2023   Chronic pain 05/22/2023   Chronic shoulder pain (2ry area of Pain) (Left) 05/22/2023   Chronic low back pain (3ry area of Pain) (Bilateral) (R>L) w/o sciatica 05/22/2023   Cervicogenic headache (4th area of Pain) (Bilateral) 05/22/2023   Chronic feet pain (5th area of Pain) (Bilateral) 05/22/2023   Abnormal MRI, cervical spine (01/16/2023) 05/22/2023   DDD (degenerative disc disease), cervical 05/22/2023   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,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) 05/22/2023   Cervical facet hypertrophy 05/22/2023   Grade 1 Retrolisthesis of C4/C5 & C5/C6 05/22/2023   Hand fracture, sequela (Right) (06/09/2015) 05/22/2023   Chronic pain syndrome 05/21/2023   Pharmacologic therapy 05/21/2023   Disorder of skeletal system 05/21/2023   Problems influencing health status 05/21/2023   Fibromyalgia 02/07/2023   Colles' fracture of radius, sequela (Right) 12/29/2022   Palpitations 07/23/2020   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  Atypical chest pain 06/29/2020   Family history of premature CAD 06/29/2020   Asthma 06/13/2019   Hiatal hernia 06/13/2019   History of heart murmur in childhood 06/13/2019   SLE  (systemic lupus erythematosus related syndrome) (HCC) 06/13/2019    Social History   Tobacco Use   Smoking status: Former    Types: Cigarettes   Smokeless tobacco: Never  Substance Use Topics   Alcohol use: Not Currently    Allergies[1]  Active Medications[2]  Immunization History  Administered Date(s) Administered   Moderna Sars-Covid-2 Vaccination 11/29/2019, 12/27/2019        Objective:     Vitals:   12/03/24 1414  BP: 108/64  Pulse: (!) 101  Temp: 98.9 F (37.2 C)  Height: 4' 11 (1.499 m)  Weight: 186 lb (84.4 kg)  SpO2: 98% Comment: room air  TempSrc: Oral  BMI (Calculated): 37.55     SpO2: 98 % (room air)  GENERAL: HEAD: Normocephalic, atraumatic.  EYES: Pupils equal, round, reactive to light.  No scleral icterus.  MOUTH:  NECK: Supple. No thyromegaly. Trachea midline. No JVD.  No adenopathy. PULMONARY: Good air entry bilaterally.  No adventitious sounds. CARDIOVASCULAR: S1 and S2. Regular rate and rhythm.  ABDOMEN: MUSCULOSKELETAL: No joint deformity, no clubbing, no edema.  NEUROLOGIC:  SKIN: Intact,warm,dry. PSYCH:        Assessment & Plan:     ICD-10-CM   1. Acute bronchitis, unspecified organism  J20.9     2. Thrush of mouth and esophagus (HCC)  B37.81    B37.0     3. Gastroesophageal reflux disease, unspecified whether esophagitis present  K21.9     4. Anal squamous cell carcinoma (HCC)  C21.0     5. Invasive carcinoma of breast (HCC)  C50.919       No orders of the defined types were placed in this encounter.   Meds ordered this encounter  Medications   fluconazole  (DIFLUCAN ) 100 MG tablet    Sig: Take 1 tablet (100 mg total) by mouth daily for 14 days.    Dispense:  14 tablet    Refill:  0      Advised if symptoms do not improve or worsen, to please contact office for sooner follow up or seek emergency care.    I spent xxx minutes of dedicated to the care of this patient on the date of this encounter to  include pre-visit review of records, face-to-face time with the patient discussing conditions above, post visit ordering of testing, clinical documentation with the electronic health record, making appropriate referrals as documented, and communicating necessary findings to members of the patients care team.     C. Leita Sanders, MD Advanced Bronchoscopy PCCM [facility]    *This note was generated using voice recognition software/Dragon and/or AI transcription program.  Despite best efforts to proofread, errors can occur which can change the meaning. Any transcriptional errors that result from this process are unintentional and may not be fully corrected at the time of dictation.     [1] Allergies Allergen Reactions   Prednisone Shortness Of Breath and Swelling    Patient has taken methylprednisolone  (Depo-Medrol ) and dexamethasone  (Decadron ) without any documented side effects or adverse reactions. She describes that she used to take prednisone due to her bronchial asthma and respiratory problems, but later on she received some prednisone which into locations caused her to have significant swelling and shortness of breath. For that reason, she was told to avoid the use of prednisone.  Silicone Hives and Itching  [2] Current Meds  Medication Sig   acetaminophen  (TYLENOL ) 500 MG tablet Take 500 mg by mouth every 6 (six) hours as needed.   albuterol  (VENTOLIN  HFA) 108 (90 Base) MCG/ACT inhaler Inhale 2 puffs into the lungs every 4 (four) hours as needed for wheezing or shortness of breath.   ascorbic acid (VITAMIN C) 500 MG tablet Take 500 mg by mouth daily.   azelastine  (ASTELIN ) 0.1 % nasal spray Place 1 spray into both nostrils 2 (two) times daily. Use in each nostril as directed   benzonatate  (TESSALON ) 200 MG capsule Take 1 capsule (200 mg total) by mouth 3 (three) times daily as needed for cough.   BIOTIN PO Take by mouth daily.   budesonide  (PULMICORT )  0.25 MG/2ML nebulizer solution Take 2 mLs (0.25 mg total) by nebulization 2 (two) times daily.   busPIRone  (BUSPAR ) 5 MG tablet Take 5 mg by mouth 2 (two) times daily.   chlorhexidine  (PERIDEX ) 0.12 % solution Use as directed 10 mLs in the mouth or throat 2 (two) times daily.   cholecalciferol (VITAMIN D3) 25 MCG (1000 UNIT) tablet Take 1,000 Units by mouth daily.   clonazePAM  (KLONOPIN ) 0.5 MG tablet Take 0.5 mg by mouth at bedtime as needed for anxiety.   dexamethasone  (DECADRON ) 4 MG tablet Take 2 tablets (8 mg total) by mouth daily for 2 days. Start the day after doxorubicin /cyclophosphamide  chemotherapy. Take with food.   diphenoxylate -atropine  (LOMOTIL ) 2.5-0.025 MG tablet Take 1 tablet by mouth 4 (four) times daily.   doxycycline (VIBRAMYCIN) 100 MG capsule Take 100 mg by mouth 2 (two) times daily. for 7 days   DULoxetine  (CYMBALTA ) 30 MG capsule Take 30 mg by mouth at bedtime.   enoxaparin  (LOVENOX ) 80 MG/0.8ML injection Inject 0.8 mLs (80 mg total) into the skin every 12 (twelve) hours.   esomeprazole  (NEXIUM ) 40 MG capsule Take 1 capsule (40 mg total) by mouth daily.   fluconazole  (DIFLUCAN ) 100 MG tablet Take 1 tablet (100 mg total) by mouth daily for 14 days.   gabapentin  (NEURONTIN ) 300 MG capsule Take 2 capsules (600 mg total) by mouth 3 (three) times daily.   ibuprofen  (ADVIL ) 200 MG tablet Take 200 mg by mouth every 6 (six) hours as needed.   ipratropium-albuterol  (DUONEB) 0.5-2.5 (3) MG/3ML SOLN Take 3 mLs by nebulization 4 (four) times daily.   lactose free nutrition (BOOST PLUS) LIQD Take 237 mLs by mouth 2 (two) times daily between meals.   lidocaine -prilocaine  (EMLA ) cream Apply 1 Application topically as needed.   linaclotide (LINZESS) 145 MCG CAPS capsule Take 145 mcg by mouth daily before breakfast.   LORazepam  (ATIVAN ) 0.5 MG tablet Take 1 tablet (0.5 mg total) by mouth every 12 (twelve) hours as needed for anxiety (nausea).   megestrol  (MEGACE ) 40 MG  tablet Take 2 tablets (80 mg total) by mouth 2 (two) times daily.   montelukast  (SINGULAIR ) 10 MG tablet Take 1 tablet (10 mg total) by mouth See admin instructions. Take 1 tablet on the day prior to chemotherapy and take 1 tablet daily for 2 days after chemotherapy.   Multiple Vitamin (MULTIVITAMIN WITH MINERALS) TABS tablet Take 1 tablet by mouth daily.   nystatin  (MYCOSTATIN ) 100000 UNIT/ML suspension Take 5 mLs (500,000 Units total) by mouth 4 (four) times daily. Swish and swallow   potassium chloride  SA (KLOR-CON  M) 20 MEQ tablet Take 1 tablet (20 mEq total) by mouth 2 (two) times daily.   prochlorperazine  (COMPAZINE ) 10 MG tablet Take 1  tablet (10 mg total) by mouth every 6 (six) hours as needed for nausea or vomiting.   promethazine  (PHENERGAN ) 25 MG tablet Take 1 tablet (25 mg total) by mouth every 8 (eight) hours as needed for nausea or vomiting.   traZODone  (DESYREL ) 100 MG tablet Take 100 mg by mouth at bedtime.  "

## 2024-12-03 NOTE — Telephone Encounter (Signed)
 Monica Gonzalez sent refill for both to pharmacy.

## 2024-12-03 NOTE — Patient Instructions (Signed)
 VISIT SUMMARY:  During your visit, we discussed your persistent cough and sore throat, which have been ongoing since Friday. You tested negative for COVID-19, and your symptoms are likely due to an upper respiratory infection. We also addressed your heartburn, thrush, and neutropenia.  YOUR PLAN:  -ACUTE UPPER RESPIRATORY INFECTION: An upper respiratory infection is a viral or bacterial infection affecting the nose, throat, and airways. You should continue taking doxycycline and use the Duoneb nebulizer for bronchitis symptoms. Avoid using the Pulmicort  nebulizer to prevent worsening of thrush.  -CANDIDAL STOMATITIS: Candidal stomatitis, or thrush, is a fungal infection in the mouth. It is likely worsened by steroid use from chemotherapy. You have been prescribed fluconazole  (Diflucan ) and should discontinue nystatin .  -GASTROESOPHAGEAL REFLUX DISEASE: Gastroesophageal reflux disease (GERD) is a condition where stomach acid frequently flows back into the esophagus, causing heartburn. Continue taking Nexium  to manage your heartburn.  -NEUTROPENIA: Neutropenia is a condition where you have a low number of neutrophils, a type of white blood cell, which increases your risk of infections. Continue to monitor for any signs of infection and take precautions to avoid exposure to infections.  INSTRUCTIONS:  Please follow up as needed if your symptoms worsen or do not improve. Continue taking your medications as prescribed and use the Duoneb nebulizer for bronchitis symptoms. Avoid using the Pulmicort  nebulizer. Monitor for any signs of infection due to neutropenia and take precautions to avoid exposure to infections.

## 2024-12-04 ENCOUNTER — Encounter: Payer: Self-pay | Admitting: Pulmonary Disease

## 2024-12-04 ENCOUNTER — Inpatient Hospital Stay

## 2024-12-04 ENCOUNTER — Other Ambulatory Visit: Payer: Self-pay

## 2024-12-04 ENCOUNTER — Encounter: Payer: Self-pay | Admitting: Nurse Practitioner

## 2024-12-04 ENCOUNTER — Encounter: Payer: Self-pay | Admitting: Oncology

## 2024-12-04 ENCOUNTER — Inpatient Hospital Stay: Admitting: Nurse Practitioner

## 2024-12-04 ENCOUNTER — Inpatient Hospital Stay: Admitting: Oncology

## 2024-12-04 VITALS — BP 111/67 | HR 97 | Temp 98.4°F | Resp 18 | Wt 184.0 lb

## 2024-12-04 DIAGNOSIS — R251 Tremor, unspecified: Secondary | ICD-10-CM

## 2024-12-04 DIAGNOSIS — D649 Anemia, unspecified: Secondary | ICD-10-CM | POA: Diagnosis not present

## 2024-12-04 DIAGNOSIS — J069 Acute upper respiratory infection, unspecified: Secondary | ICD-10-CM

## 2024-12-04 DIAGNOSIS — C50919 Malignant neoplasm of unspecified site of unspecified female breast: Secondary | ICD-10-CM

## 2024-12-04 DIAGNOSIS — E860 Dehydration: Secondary | ICD-10-CM

## 2024-12-04 DIAGNOSIS — R110 Nausea: Secondary | ICD-10-CM | POA: Diagnosis not present

## 2024-12-04 DIAGNOSIS — B370 Candidal stomatitis: Secondary | ICD-10-CM | POA: Diagnosis not present

## 2024-12-04 DIAGNOSIS — C210 Malignant neoplasm of anus, unspecified: Secondary | ICD-10-CM | POA: Diagnosis not present

## 2024-12-04 LAB — IRON AND TIBC
Iron: 133 ug/dL (ref 28–170)
Saturation Ratios: 54 % — ABNORMAL HIGH (ref 10.4–31.8)
TIBC: 248 ug/dL — ABNORMAL LOW (ref 250–450)
UIBC: 115 ug/dL

## 2024-12-04 LAB — VITAMIN B12: Vitamin B-12: 585 pg/mL (ref 180–914)

## 2024-12-04 LAB — CBC WITH DIFFERENTIAL (CANCER CENTER ONLY)
Abs Immature Granulocytes: 1.35 K/uL — ABNORMAL HIGH (ref 0.00–0.07)
Basophils Absolute: 0.1 K/uL (ref 0.0–0.1)
Basophils Relative: 1 %
Eosinophils Absolute: 0 K/uL (ref 0.0–0.5)
Eosinophils Relative: 0 %
HCT: 26.8 % — ABNORMAL LOW (ref 36.0–46.0)
Hemoglobin: 8.6 g/dL — ABNORMAL LOW (ref 12.0–15.0)
Immature Granulocytes: 16 %
Lymphocytes Relative: 5 %
Lymphs Abs: 0.5 K/uL — ABNORMAL LOW (ref 0.7–4.0)
MCH: 32.3 pg (ref 26.0–34.0)
MCHC: 32.1 g/dL (ref 30.0–36.0)
MCV: 100.8 fL — ABNORMAL HIGH (ref 80.0–100.0)
Monocytes Absolute: 0.9 K/uL (ref 0.1–1.0)
Monocytes Relative: 11 %
Neutro Abs: 5.5 K/uL (ref 1.7–7.7)
Neutrophils Relative %: 67 %
Platelet Count: 125 K/uL — ABNORMAL LOW (ref 150–400)
RBC: 2.66 MIL/uL — ABNORMAL LOW (ref 3.87–5.11)
RDW: 15.3 % (ref 11.5–15.5)
WBC Count: 8.3 K/uL (ref 4.0–10.5)
nRBC: 0.2 % (ref 0.0–0.2)

## 2024-12-04 LAB — CMP (CANCER CENTER ONLY)
ALT: 16 U/L (ref 0–44)
AST: 17 U/L (ref 15–41)
Albumin: 3.6 g/dL (ref 3.5–5.0)
Alkaline Phosphatase: 71 U/L (ref 38–126)
Anion gap: 10 (ref 5–15)
BUN: 8 mg/dL (ref 6–20)
CO2: 21 mmol/L — ABNORMAL LOW (ref 22–32)
Calcium: 9.5 mg/dL (ref 8.9–10.3)
Chloride: 109 mmol/L (ref 98–111)
Creatinine: 0.78 mg/dL (ref 0.44–1.00)
GFR, Estimated: >60 mL/min
Glucose, Bld: 94 mg/dL (ref 70–99)
Potassium: 4.3 mmol/L (ref 3.5–5.1)
Sodium: 140 mmol/L (ref 135–145)
Total Bilirubin: 0.3 mg/dL (ref 0.0–1.2)
Total Protein: 6 g/dL — ABNORMAL LOW (ref 6.5–8.1)

## 2024-12-04 LAB — TSH: TSH: 0.676 u[IU]/mL (ref 0.350–4.500)

## 2024-12-04 LAB — FERRITIN: Ferritin: 290 ng/mL (ref 11–307)

## 2024-12-04 LAB — FOLATE: Folate: 7.1 ng/mL

## 2024-12-04 MED ORDER — ONDANSETRON HCL 4 MG/2ML IJ SOLN
4.0000 mg | Freq: Once | INTRAMUSCULAR | Status: AC
  Administered 2024-12-04: 4 mg via INTRAVENOUS
  Filled 2024-12-04: qty 2

## 2024-12-04 MED ORDER — NYSTATIN 100000 UNIT/ML MT SUSP
5.0000 mL | Freq: Three times a day (TID) | OROMUCOSAL | 0 refills | Status: AC
  Filled 2024-12-04: qty 225, 15d supply, fill #0

## 2024-12-04 MED ORDER — SODIUM CHLORIDE 0.9 % IV SOLN
Freq: Once | INTRAVENOUS | Status: AC
  Filled 2024-12-04: qty 250

## 2024-12-04 NOTE — Progress Notes (Signed)
 "  [facility] [phone] [phone]  Patient Care Team: Epifanio Alm SQUIBB, MD as PCP - General (Infectious Diseases) Maurie Rayfield BIRCH, RN as Oncology Nurse Navigator Babara Call, MD as Consulting Physician (Oncology) Lenn Aran, MD as Consulting Physician (Radiation Oncology) Georgina Shasta POUR, RN as Oncology Nurse Navigator   NAME OF PATIENT: Monica Gonzalez  [id number]  18-Jul-1976   DATE OF VISIT: 12/04/24  REASON FOR CONSULT: Salihah Peckham Betterton is a 49 y.o. 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    INTERVAL HISTORY: Patient saw Dr. Babara on 11/27/2024 and received cycle 2 ddAC with D3 G-CSF.  Plan was to delay next cycle of chemotherapy about 1 week due to toxicities from chemotherapy.  Recent diagnosis of PE.  Patient is on Lovenox  twice daily.  She saw her PCP Dr. Epifanio on 11/29/2024.  Patient had complaints of alternating diarrhea and constipation.  She also had URI symptoms and low-grade fever at home.  Patient was started on doxycycline.  Patient did see pulmonology yesterday and they did confirm upper resp. Infection/bronchitis and ordered for her to stay on doxycycline.  Pulmonology also dx her with thrush in her throat as well and started her on fluconazole . In which she has started taking she reports some improvement.    Oral intake is reportedly poor.  Patient says she feels dehydrated.  Denies any neurologic complaints. Denies any easy bleeding or bruising.  Denies shortness of breath chest pain.  Denies urinary complaints. Patient offers no further specific complaints today.   PAST MEDICAL HISTORY: Past Medical History:  Diagnosis Date   Anemia    Anginal pain    Anxiety    Arthritis    Asthma    Cancer (HCC)    rectal cancer, breast cancer  2025   Chronic pain syndrome 05/21/2023   DDD (degenerative disc disease), cervical 05/22/2023   Deep venous thrombosis (HCC)    Depression    Dyspnea    Fibromyalgia    GERD (gastroesophageal reflux disease)    Headache    Heart murmur    History of hiatal hernia    History of kidney stones    Lupus    Malignant neoplasm of upper-outer quadrant of right female breast, unspecified estrogen receptor status (HCC) 2025   Pneumonia    PTSD (post-traumatic stress disorder)    Skin candidiasis 05/29/2024   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    PAST SURGICAL HISTORY:  Past Surgical History:  Procedure Laterality Date   BREAST BIOPSY Right 06/18/2024   US  RT BREAST BX W LOC DEV 1ST LESION IMG BX SPEC US  GUIDE 06/18/2024 [facility]   BREAST BIOPSY Right 06/18/2024   US  RT BREAST BX W LOC DEV EA ADD LESION IMG BX SPEC US  GUIDE 06/18/2024 [facility]   BREAST BIOPSY Right 06/18/2024   US  RT BREAST BX W LOC DEV EA ADD LESION IMG BX SPEC US  GUIDE 06/18/2024 [facility]   BREAST BIOPSY Right 07/05/2024   US  RT BREAST SAVI/RF TAG 1ST LESION US  GUIDE 07/05/2024 [facility]   BREAST BIOPSY Right 07/05/2024   US  RT BREAST SAVI/RF TAG EA ADD'L LESION US  GUIDE 07/05/2024 [facility]   BREAST LUMPECTOMY Right 07/25/2024   Procedure: BREAST LUMPECTOMY;  Surgeon: Tye Millet, DO;  Location: [facility];  Service: General;  Laterality: Right;   BREAST  LUMPECTOMY WITH RADIO FREQUENCY LOCALIZER Right 07/08/2024   Procedure: BREAST LUMPECTOMY WITH RADIO FREQUENCY LOCALIZER;  Surgeon: Tye Millet, DO;  Location: [facility];  Service: General;  Laterality: Right;   COLONOSCOPY WITH PROPOFOL  N/A 02/17/2023   Procedure: COLONOSCOPY WITH PROPOFOL ;  Surgeon: Maryruth Ole DASEN, MD;  Location: [facility];  Service: Endoscopy;  Laterality: N/A;   ESOPHAGOGASTRODUODENOSCOPY (EGD) WITH PROPOFOL  N/A 02/17/2023   Procedure: ESOPHAGOGASTRODUODENOSCOPY (EGD) WITH PROPOFOL ;   Surgeon: Maryruth Ole DASEN, MD;  Location: [facility];  Service: Endoscopy;  Laterality: N/A;   EXTRACORPOREAL SHOCK WAVE LITHOTRIPSY     IR CV LINE INJECTION  08/21/2024   IR REMOVAL TUN ACCESS W/ PORT W/O FL MOD SED  08/28/2024   PICC LINE INSERTION N/A 08/2024   PORTA CATH INSERTION N/A 10/14/2024   Procedure: PORTA CATH INSERTION;  Surgeon: Marea Selinda RAMAN, MD;  Location: [facility];  Service: Cardiovascular;  Laterality: N/A;   PORTA CATH REMOVAL N/A 10/14/2024   Procedure: PORTA CATH REMOVAL;  Surgeon: Marea Selinda RAMAN, MD;  Location: [facility];  Service: Cardiovascular;  Laterality: N/A;   PORTACATH PLACEMENT Right 07/08/2024   Procedure: INSERTION, TUNNELED CENTRAL VENOUS DEVICE, WITH PORT;  Surgeon: Tye Millet, DO;  Location: [facility];  Service: General;  Laterality: Right;   RECTAL EXAM UNDER ANESTHESIA N/A 05/16/2024   Procedure: EXAM UNDER ANESTHESIA, RECTUM, INCISIONAL BIOPSY OF ANAL MASS;  Surgeon: Tye Millet, DO;  Location: [facility];  Service: General;  Laterality: N/A;   SENTINEL NODE BIOPSY Right 07/08/2024   Procedure: BIOPSY, LYMPH NODE, SENTINEL;  Surgeon: Tye Millet, DO;  Location: [facility];  Service: General;  Laterality: Right;   TONSILLECTOMY     TUBAL LIGATION     VULVECTOMY N/A 07/29/2020   Procedure: WIDE EXCISION VULVECTOMY;  Surgeon: Mancil Barter, MD;  Location: [facility];  Service: Gynecology;  Laterality: N/A;    HEMATOLOGY/ONCOLOGY HISTORY:  Oncology History  Anal squamous cell carcinoma (HCC)  05/29/2024 Initial Diagnosis   Anal squamous cell carcinoma   Patient has noticed a hard mass at the anal area which she initially thought was a hemorrhoid.  The mass has been present for more than a month and appears to be enlarging. Patient was referred to establish care with surgeon Dr. Tye  05/16/2024, incisional biopsy of anal mass showed 1. Anus, biopsy, mass :       - INVASIVE MODERATELY DIFFERENTIATED SQUAMOUS CELL  CARCINOMA  06/19/2024, left inguinal adenopathy biopsy showed Metastatic moderately differentiated squamous cell carcinoma.  '    05/29/2024 Cancer Staging   Staging form: Anus, AJCC V9 - Clinical stage from 05/29/2024: Stage IIIA (cT3, cN1a, cM0) - Signed by Babara Call, MD on 06/26/2024 Stage prefix: Initial diagnosis   06/05/2024 Imaging   CT chest abdomen pelvis with contrast  Slight thickening in the area of the anal canal. Please correlate for history of neoplasm. There is a pathologic enlarged left inguinal node. There is a borderline right-sided node.   No additional areas of abnormal nodal enlargement or other aggressive appearing mass lesion at this time.   Fatty liver infiltration with known segment 7 hepatic hemangioma.   Of note there is a right-sided upper breast mass which was not clearly seen on prior CT scan. Please correlate for any prior study or if needed diagnostic mammographic evaluation and possible ultrasound when appropriate.     06/12/2024 Imaging   CT chest abdomen pelvis with contrast showed Slight thickening in the area of the anal  canal. Please correlate for history of neoplasm. There is a pathologic enlarged left inguinal node. There is a borderline right-sided node.   No additional areas of abnormal nodal enlargement or other aggressive appearing mass lesion at this time.   Fatty liver infiltration with known segment 7 hepatic hemangioma.   Of note there is a right-sided upper breast mass which was not clearly seen on prior CT scan. Please correlate for any prior study or if needed diagnostic mammographic evaluation and possible ultrasound when appropriate.   06/19/2024 Procedure   1. Lymph node, biopsy, left inguinal adenopathy :       -  METASTATIC MODERATELY DIFFERENTIATED SQUAMOUS CELL CARCINOMA    07/01/2024 Imaging   PET scan showed 1. Hypermetabolic anal mass with hypermetabolic left inguinal adenopathy, compatible with anorectal  carcinoma. 2. Hypermetabolic right breast nodule with recent biopsies on 06/18/2024. Please correlate with pathology report.   07/31/2024 - 07/31/2024 Chemotherapy   Patient is on Treatment Plan : ANUS Mitomycin  D1,28 + 5FU D1-4, 28-31 q32d     08/23/2024 Genetic Testing   Negative genetic testing. No pathogenic variants identified on the [facility] CancerNext-Expanded+RNA Panel. The report date is 08/23/2024.  The CancerNext-Expanded gene panel offered by [facility] and includes sequencing, rearrangement, and RNA analysis for the following 77 genes: AIP, ALK, APC, ATM, AXIN2, BAP1, BARD1, BMPR1A, BRCA1, BRCA2, BRIP1, CDC73, CDH1, CDK4, CDKN1B, CDKN2A, CEBPA, CHEK2, CTNNA1, DDX41, DICER1, ETV6, FH, FLCN, GATA2, LZTR1, MAX, MBD4, MEN1, MET, MLH1, MSH2, MSH3, MSH6, MUTYH, NF1, NF2, NTHL1, PALB2, PHOX2B, PMS2, POT1, PRKAR1A, PTCH1, PTEN, RAD51C, RAD51D, RB1, RET, RPS20, RUNX1, SDHA, SDHAF2, SDHB, SDHC, SDHD, SMAD4, SMARCA4, SMARCB1, SMARCE1, STK11, SUFU, TMEM127, TP53, TSC1, TSC2, VHL, and WT1 (sequencing and deletion/duplication); EGFR, HOXB13, KIT, MITF, PDGFRA, POLD1, and POLE (sequencing only); EPCAM and GREM1 (deletion/duplication only).    08/29/2024 - 08/29/2024 Chemotherapy   Patient is on Treatment Plan : ANUS Mitomycin  D1,29 + Capecitabine  + XRT     Invasive carcinoma of breast (HCC)  06/12/2024 Imaging   CT chest abdomen pelvis with contrast showed Slight thickening in the area of the anal canal. Please correlate for history of neoplasm. There is a pathologic enlarged left inguinal node. There is a borderline right-sided node.   No additional areas of abnormal nodal enlargement or other aggressive appearing mass lesion at this time.   Fatty liver infiltration with known segment 7 hepatic hemangioma.   Of note there is a right-sided upper breast mass which was not clearly seen on prior CT scan. Please correlate for any prior study or if needed diagnostic mammographic evaluation and  possible ultrasound when appropriate.   06/17/2024 Mammogram   1. There is a suspicious 17 mm mass at the site of CT and mammographic concern in the RIGHT breast. It demonstrates associated pleomorphic calcifications. Recommend ultrasound-guided biopsy for definitive characterization. 2. There is incidental sonographic note of a 7 mm mass in the RIGHT breast at 12 o'clock and a 6 mm non mass area at 9 o'clock. Recommend ultrasound-guided biopsy of these 2 areas for definitive characterization given suspicious appearance of the dominant mass. 3. No suspicious RIGHT axillary adenopathy. 4. No mammographic evidence of malignancy in the LEFT breast.   06/18/2024 Cancer Staging   Staging form: Breast, AJCC 8th Edition - Clinical stage from 06/18/2024: Stage IB (cT1b, cN0, cM0, G3, ER-, PR-, HER2-) - Signed by Babara Call, MD on 06/26/2024 Stage prefix: Initial diagnosis Histologic grading system: 3 grade system   06/18/2024 Initial Diagnosis   Invasive  carcinoma of breast [facility])  Patient with right breast biopsy.  Pathology showed 1. Breast, right, needle core biopsy, 12:00 12cmfn (heart clip) :      INVASIVE DUCTAL CARCINOMA      TUBULE FORMATION: SCORE 3      NUCLEAR PLEOMORPHISM: SCORE 3      MITOTIC COUNT: SCORE 2      TOTAL SCORE: 8      OVERALL GRADE: 3      LYMPHOVASCULAR INVASION: NOT IDENTIFIED      CANCER LENGTH: 1.0 CM      CALCIFICATIONS: NOT IDENTIFIED      OTHER FINDINGS: NONE      ER negative, PR negative, HER2 negative (IHC 0) Ki-67 95%.       2. Breast, right, needle core biopsy, 12:00 8cmfn (coil clip) :      INVASIVE DUCTAL CARCINOMA      DUCTAL CARCINOMA IN SITU, SOLID, HIGH NUCLEAR GRADE WITH NECROSIS      TUBULE FORMATION: SCORE 3      NUCLEAR PLEOMORPHISM: SCORE 3      MITOTIC COUNT: SCORE 2      TOTAL SCORE: 8      OVERALL GRADE: 3      LYMPHOVASCULAR INVASION: NOT IDENTIFIED      CANCER LENGTH: 0.3 CM      CALCIFICATIONS: NOT IDENTIFIED      OTHER FINDINGS:  NONE            3. Breast, right, needle core biopsy, 9:00 12cmfn (venus) :      BENIGN BREAST TISSUE WITH DENSE STROMAL FIBROSIS.      NEGATIVE FOR ATYPIA OR MALIGNANCY.    07/08/2024 Surgery   Patient went right breast lumpectomy and sentinel lymph node biopsy.  1. 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      - DUCTAL CARCINOMA IN SITU (DCIS).      - SEE CANCER SUMMARY AND NOTE BELOW.      - TWO BIOPSY SITES WITH CORRESPONDING HEART AND COIL CLIPS.      - TWO SAVI SCOUT TAGS.       2. Lymph node, sentinel, biopsy, Right :      - MICROMETASTATIC CARCINOMA INVOLVES ONE OF FIVE LYMPH NODES (1MI/5).      - SEE CANCER SUMMARY AND NOTE BELOW.       3. Breast, excision, Right medial posterior margin :      - BENIGN BREAST TISSUE.      - NEGATIVE FOR ATYPIA AND MALIGNANCY.      - SEE CANCER SUMMARY BELOW.   TUMOR Histologic Type: Invasive carcinoma of no special type (ductal) Histologic Grade (Nottingham Histologic Score) Glandular (Acinar)/Tubular Differentiation: 3 Nuclear Pleomorphism: 3 Mitotic Rate: 3 Overall Grade: 3 Tumor Size: Greatest dimension of largest invasive focus: 19 mm Ductal Carcinoma In Situ (DCIS): Present, high-grade with comedonecrosis Lymphatic and/or Vascular Invasion: Present, extensive (LVI in 2 or more blocks)  Treatment Effect in the Breast: No known presurgical therapy MARGINS Margin Status for Invasive Carcinoma: Invasive Carcinoma involves inferior (unifocal) and superior (unifocal) margins and is 0.5 mm to anterior margin Margin Status for DCIS: All margins negative for DCIS Distance from DCIS to closest margin: 0.5 mm Specify closest margin: Inferior REGIONAL LYMPH NODES Regional Lymph Node Status: Tumor present in regional lymph node(s) Number of Lymph Nodes with Macrometastases (greater than 2 mm): 0 Number of Lymph Nodes with Micrometastases (  greater than 0.2 mm to 2 mm  and/or greater than 200 cells): 1 Number of Lymph Nodes with Isolated Tumor Cells (0.2 mm or less OR 200 cells or less): 0 Size of Largest Metastatic Deposit: 1.9 mm Extranodal Extension: Not identified Total Number of Lymph Nodes Examined (sentinel and non-sentinel): 5 Number of Sentinel Nodes Examined: 5 DISTANT METASTASIS Distant Site(s) Involved, if applicable: Not applicable PATHOLOGIC STAGE CLASSIFICATION (pTNM, AJCC 8th Edition): Modified Classification: Not applicable pT Category: pT1c T Suffix: (m) multiple primary synchronous tumors in a single organ pN Category: pN26mi N Suffix: (sn) pM Category: Not applicable SPECIAL STUDIES Breast Biomarker Testing Performed on Previous Biopsy: [id number] (heart) Estrogen Receptor: Negative (0%) Progesterone Receptor: Negative (0%) HER2 IHC: Negative (0) Proliferation Marker Ki67: 95%   07/22/2024 Cancer Staging   Staging form: Breast, AJCC 8th Edition - Pathologic stage from 07/22/2024: Stage IB (pT1c, pN21mi(sn), cM0, G3, ER-, PR-, HER2-) - Signed by Babara Call, MD on 07/22/2024 Stage prefix: Initial diagnosis Method of lymph node assessment: Sentinel lymph node biopsy Multigene prognostic tests performed: None Histologic grading system: 3 grade system   07/25/2024 Surgery   Patient reports additional excision for positive margin.  1. Breast, excision, superior margin of right breast - RESIDUAL INVASIVE DUCTAL CARCINOMA, COMPLETELY EXCISED. - CARCINOMA PRESENT 2 MM FROM THE POSTERIOR MARGIN AND 4 MM FROM THE ANTERIOR AND SUPERIOR MARGINS. 2. Breast, excision, inferior margin of right breast - RESIDUAL INVASIVE DUCTAL CARCINOMA, COMPLETELY EXCISED. - CARCINOMA PRESENT 1 MM FROM THE NEW INFERIOR MARGIN.   08/23/2024 Genetic Testing   Negative genetic testing. No pathogenic variants identified on the [facility] CancerNext-Expanded+RNA Panel. The report date is 08/23/2024.  The CancerNext-Expanded gene panel offered by [facility] and  includes sequencing, rearrangement, and RNA analysis for the following 77 genes: AIP, ALK, APC, ATM, AXIN2, BAP1, BARD1, BMPR1A, BRCA1, BRCA2, BRIP1, CDC73, CDH1, CDK4, CDKN1B, CDKN2A, CEBPA, CHEK2, CTNNA1, DDX41, DICER1, ETV6, FH, FLCN, GATA2, LZTR1, MAX, MBD4, MEN1, MET, MLH1, MSH2, MSH3, MSH6, MUTYH, NF1, NF2, NTHL1, PALB2, PHOX2B, PMS2, POT1, PRKAR1A, PTCH1, PTEN, RAD51C, RAD51D, RB1, RET, RPS20, RUNX1, SDHA, SDHAF2, SDHB, SDHC, SDHD, SMAD4, SMARCA4, SMARCB1, SMARCE1, STK11, SUFU, TMEM127, TP53, TSC1, TSC2, VHL, and WT1 (sequencing and deletion/duplication); EGFR, HOXB13, KIT, MITF, PDGFRA, POLD1, and POLE (sequencing only); EPCAM and GREM1 (deletion/duplication only).    10/23/2024 -  Chemotherapy   Patient is on Treatment Plan : BREAST DOSE DENSE AC q14d / PACLitaxel q7d       ALLERGIES:  is allergic to prednisone and silicone.  MEDICATIONS:  Current Outpatient Medications  Medication Sig Dispense Refill   acetaminophen  (TYLENOL ) 500 MG tablet Take 500 mg by mouth every 6 (six) hours as needed.     albuterol  (VENTOLIN  HFA) 108 (90 Base) MCG/ACT inhaler Inhale 2 puffs into the lungs every 4 (four) hours as needed for wheezing or shortness of breath. 8.5 g 2   alum & mag hydroxide-simeth-nystatin -diphenhydrAMINE  Take 5 mLs by mouth 3 (three) times daily for 15 days. Suspension contains equal amounts of Maalox Extra Strength, nystatin , and diphenhydramine . 225 mL 0   ascorbic acid (VITAMIN C) 500 MG tablet Take 500 mg by mouth daily.     azelastine  (ASTELIN ) 0.1 % nasal spray Place 1 spray into both nostrils 2 (two) times daily. Use in each nostril as directed 30 mL 0   benzonatate  (TESSALON ) 200 MG capsule Take 1 capsule (200 mg total) by mouth 3 (three) times daily as needed for cough. 30 capsule 1   BIOTIN PO Take  by mouth daily.     budesonide  (PULMICORT ) 0.25 MG/2ML nebulizer solution Take 2 mLs (0.25 mg total) by nebulization 2 (two) times daily. 60 mL 12   busPIRone  (BUSPAR ) 5 MG tablet  Take 5 mg by mouth 2 (two) times daily.     chlorhexidine  (PERIDEX ) 0.12 % solution Use as directed 10 mLs in the mouth or throat 2 (two) times daily. 473 mL 0   cholecalciferol (VITAMIN D3) 25 MCG (1000 UNIT) tablet Take 1,000 Units by mouth daily.     clonazePAM  (KLONOPIN ) 0.5 MG tablet Take 0.5 mg by mouth at bedtime as needed for anxiety.     dexamethasone  (DECADRON ) 4 MG tablet Take 2 tablets (8 mg total) by mouth daily for 2 days. Start the day after doxorubicin /cyclophosphamide  chemotherapy. Take with food. 30 tablet 1   diphenoxylate -atropine  (LOMOTIL ) 2.5-0.025 MG tablet Take 1 tablet by mouth 4 (four) times daily. 90 tablet 0   doxycycline (VIBRAMYCIN) 100 MG capsule Take 100 mg by mouth 2 (two) times daily. for 7 days     DULoxetine  (CYMBALTA ) 30 MG capsule Take 30 mg by mouth at bedtime.     enoxaparin  (LOVENOX ) 80 MG/0.8ML injection Inject 0.8 mLs (80 mg total) into the skin every 12 (twelve) hours. 48 mL 2   esomeprazole  (NEXIUM ) 40 MG capsule Take 1 capsule (40 mg total) by mouth daily. 90 capsule 1   fluconazole  (DIFLUCAN ) 100 MG tablet Take 1 tablet (100 mg total) by mouth daily for 14 days. 14 tablet 0   gabapentin  (NEURONTIN ) 300 MG capsule Take 2 capsules (600 mg total) by mouth 3 (three) times daily. 180 capsule 2   ibuprofen  (ADVIL ) 200 MG tablet Take 200 mg by mouth every 6 (six) hours as needed.     ipratropium-albuterol  (DUONEB) 0.5-2.5 (3) MG/3ML SOLN Take 3 mLs by nebulization 4 (four) times daily. 360 mL 3   lactose free nutrition (BOOST PLUS) LIQD Take 237 mLs by mouth 2 (two) times daily between meals. 237 mL 11   lidocaine -prilocaine  (EMLA ) cream Apply 1 Application topically as needed. 30 g 3   linaclotide (LINZESS) 145 MCG CAPS capsule Take 145 mcg by mouth daily before breakfast.     LORazepam  (ATIVAN ) 0.5 MG tablet Take 1 tablet (0.5 mg total) by mouth every 12 (twelve) hours as needed for anxiety (nausea). 30 tablet 0   megestrol  (MEGACE ) 40 MG tablet Take 2  tablets (80 mg total) by mouth 2 (two) times daily. 120 tablet 0   montelukast  (SINGULAIR ) 10 MG tablet Take 1 tablet (10 mg total) by mouth See admin instructions. Take 1 tablet on the day prior to chemotherapy and take 1 tablet daily for 2 days after chemotherapy. 30 tablet 0   Multiple Vitamin (MULTIVITAMIN WITH MINERALS) TABS tablet Take 1 tablet by mouth daily.     nystatin  (MYCOSTATIN ) 100000 UNIT/ML suspension Take 5 mLs (500,000 Units total) by mouth 4 (four) times daily. Swish and swallow 473 mL 1   oxyCODONE  (OXY IR/ROXICODONE ) 5 MG immediate release tablet Take 1 tablet (5 mg total) by mouth every 6 (six) hours as needed for severe pain (pain score 7-10) or breakthrough pain. 15 tablet 0   potassium chloride  SA (KLOR-CON  M) 20 MEQ tablet Take 1 tablet (20 mEq total) by mouth 2 (two) times daily. 60 tablet 0   prochlorperazine  (COMPAZINE ) 10 MG tablet Take 1 tablet (10 mg total) by mouth every 6 (six) hours as needed for nausea or vomiting. 30 tablet 1   promethazine  (  PHENERGAN ) 25 MG tablet Take 1 tablet (25 mg total) by mouth every 8 (eight) hours as needed for nausea or vomiting. 60 tablet 0   traZODone  (DESYREL ) 100 MG tablet Take 100 mg by mouth at bedtime.     naloxone  (NARCAN ) nasal spray 4 mg/0.1 mL SPRAY 1 SPRAY INTO ONE NOSTRIL AS DIRECTED FOR OPIOID OVERDOSE (TURN PERSON ON SIDE AFTER DOSE. IF NO RESPONSE IN 2-3 MINUTES OR PERSON RESPONDS BUT RELAPSES, REPEAT USING A NEW SPRAY DEVICE AND SPRAY INTO THE OTHER NOSTRIL. CALL 911 AFTER USE.) * EMERGENCY USE ONLY * (Patient not taking: Reported on 12/04/2024) 1 each 0   No current facility-administered medications for this visit.    VITAL SIGNS: BP 111/67 (BP Location: Left Arm, Patient Position: Sitting, Cuff Size: Normal)   Pulse 97   Temp 98.4 F (36.9 C) (Oral)   Resp 18   Wt 184 lb (83.5 kg)   SpO2 100%   BMI 37.16 kg/m  Filed Weights   12/04/24 1343  Weight: 184 lb (83.5 kg)     Estimated body mass index is 37.16  kg/m as calculated from the following:   Height as of 12/03/24: 4' 11 (1.499 m).   Weight as of this encounter: 184 lb (83.5 kg).  LABS: CBC:    Component Value Date/Time   WBC 8.3 12/04/2024 1325   WBC 2.3 (L) 09/13/2024 0500   HGB 8.6 (L) 12/04/2024 1325   HGB 14.1 03/06/2015 1307   HCT 26.8 (L) 12/04/2024 1325   HCT 42.4 03/06/2015 1307   PLT 125 (L) 12/04/2024 1325   PLT 252 03/06/2015 1307   MCV 100.8 (H) 12/04/2024 1325   MCV 90 03/06/2015 1307   NEUTROABS 5.5 12/04/2024 1325   NEUTROABS 4.8 03/06/2015 1307   LYMPHSABS 0.5 (L) 12/04/2024 1325   LYMPHSABS 0.5 (L) 03/06/2015 1307   MONOABS 0.9 12/04/2024 1325   MONOABS 0.4 03/06/2015 1307   EOSABS 0.0 12/04/2024 1325   EOSABS 0.0 03/06/2015 1307   BASOSABS 0.1 12/04/2024 1325   BASOSABS 0.1 03/06/2015 1307   Comprehensive Metabolic Panel:    Component Value Date/Time   NA 140 12/04/2024 1325   NA 141 03/06/2015 1307   K 4.3 12/04/2024 1325   K 3.2 (L) 03/06/2015 1307   CL 109 12/04/2024 1325   CL 107 03/06/2015 1307   CO2 21 (L) 12/04/2024 1325   CO2 25 03/06/2015 1307   BUN 8 12/04/2024 1325   BUN 5 (L) 03/06/2015 1307   CREATININE 0.78 12/04/2024 1325   CREATININE 0.89 03/06/2015 1307   GLUCOSE 94 12/04/2024 1325   GLUCOSE 83 03/06/2015 1307   CALCIUM 9.5 12/04/2024 1325   CALCIUM 8.8 (L) 03/06/2015 1307   AST 17 12/04/2024 1325   ALT 16 12/04/2024 1325   ALT 14 03/06/2015 1307   ALKPHOS 71 12/04/2024 1325   ALKPHOS 46 03/06/2015 1307   BILITOT 0.3 12/04/2024 1325   PROT 6.0 (L) 12/04/2024 1325   PROT 7.4 03/06/2015 1307   ALBUMIN  3.6 12/04/2024 1325   ALBUMIN  4.2 03/06/2015 1307    RADIOGRAPHIC STUDIES: DG Abd 2 Views Result Date: 11/27/2024 CLINICAL DATA:  Abdominal pain EXAM: ABDOMEN - 2 VIEW COMPARISON:  12/24/2018, CT 08/17/2024 FINDINGS: Central venous catheter tip in the right atrium. No free air beneath the diaphragm. Nonobstructed gas pattern. Phleboliths in the pelvis. IMPRESSION:  Nonobstructed gas pattern. Electronically Signed   By: Luke Bun M.D.   On: 11/27/2024 17:33    PERFORMANCE STATUS (ECOG) :  1 - Symptomatic but completely ambulatory  Review of Systems  Constitutional:  Positive for fatigue. Negative for chills, diaphoresis and fever.  HENT:  Positive for dental problem and sore throat. Negative for congestion.   Respiratory:  Positive for cough. Negative for shortness of breath and wheezing.   Gastrointestinal:  Negative for abdominal distention.  Endocrine: Negative.   Genitourinary: Negative.   Musculoskeletal: Negative.   Skin: Negative.   Allergic/Immunologic: Negative.   Neurological: Negative.   Hematological: Negative.   Psychiatric/Behavioral: Negative.     Unless otherwise noted, a complete review of systems is negative.  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  She did see pulmonology yesterday improving per patient reports no fever/chills  Constipation -recommended bowel regimen with MiraLAX .  No abdominal distention or pain.  Recent abdominal x-ray showed nonobstructive gas pattern. stable  Dehydration/nausea -no significant metabolic derangements on labs.  Proceed with IV fluids today and IV zofran   Oral thrush:  Dx by pulmonology and started on fluconazole  yesterday she reports improvement today in symptoms.  Anemia: could be secondary to chemotherapy added ferritin, iron and TIBC, vitamin b12, folate, and TSH to labs to  f/u deficiency as a factor Hg 8.6 today continue to closely monitor   Tremors:  Patient reports worsening tremors in both arms, no tremors seen today on exam she asked to go back to neurology referral sent  Patient expressed understanding and was in agreement with this plan. She also understands that She can call clinic at any time with any questions, concerns, or complaints.   Thank you for allowing me to participate in the care of this very pleasant patient.   Follow up plan: Per IS   Signed by: Morna Husband AGNP-C 12/04/24     "

## 2024-12-05 ENCOUNTER — Encounter: Payer: Self-pay | Admitting: Oncology

## 2024-12-05 ENCOUNTER — Other Ambulatory Visit: Payer: Self-pay

## 2024-12-06 ENCOUNTER — Inpatient Hospital Stay

## 2024-12-10 MED FILL — Fosaprepitant Dimeglumine For IV Infusion 150 MG (Base Eq): INTRAVENOUS | Qty: 5 | Status: AC

## 2024-12-11 ENCOUNTER — Inpatient Hospital Stay

## 2024-12-11 ENCOUNTER — Telehealth: Payer: Self-pay | Admitting: *Deleted

## 2024-12-11 ENCOUNTER — Encounter: Payer: Self-pay | Admitting: Oncology

## 2024-12-11 ENCOUNTER — Inpatient Hospital Stay: Admitting: Oncology

## 2024-12-11 VITALS — BP 110/74 | HR 112 | Temp 98.9°F | Resp 18 | Wt 184.8 lb

## 2024-12-11 VITALS — BP 92/61 | HR 91

## 2024-12-11 DIAGNOSIS — K0889 Other specified disorders of teeth and supporting structures: Secondary | ICD-10-CM | POA: Diagnosis not present

## 2024-12-11 DIAGNOSIS — D701 Agranulocytosis secondary to cancer chemotherapy: Secondary | ICD-10-CM | POA: Diagnosis not present

## 2024-12-11 DIAGNOSIS — D649 Anemia, unspecified: Secondary | ICD-10-CM

## 2024-12-11 DIAGNOSIS — T451X5A Adverse effect of antineoplastic and immunosuppressive drugs, initial encounter: Secondary | ICD-10-CM

## 2024-12-11 DIAGNOSIS — C50919 Malignant neoplasm of unspecified site of unspecified female breast: Secondary | ICD-10-CM

## 2024-12-11 DIAGNOSIS — I2699 Other pulmonary embolism without acute cor pulmonale: Secondary | ICD-10-CM

## 2024-12-11 DIAGNOSIS — C210 Malignant neoplasm of anus, unspecified: Secondary | ICD-10-CM

## 2024-12-11 DIAGNOSIS — R110 Nausea: Secondary | ICD-10-CM | POA: Diagnosis not present

## 2024-12-11 DIAGNOSIS — G629 Polyneuropathy, unspecified: Secondary | ICD-10-CM | POA: Diagnosis not present

## 2024-12-11 LAB — CBC WITH DIFFERENTIAL (CANCER CENTER ONLY)
Abs Immature Granulocytes: 0.23 10*3/uL — ABNORMAL HIGH (ref 0.00–0.07)
Basophils Absolute: 0.1 10*3/uL (ref 0.0–0.1)
Basophils Relative: 1 %
Eosinophils Absolute: 0 10*3/uL (ref 0.0–0.5)
Eosinophils Relative: 0 %
HCT: 29.3 % — ABNORMAL LOW (ref 36.0–46.0)
Hemoglobin: 9.2 g/dL — ABNORMAL LOW (ref 12.0–15.0)
Immature Granulocytes: 4 %
Lymphocytes Relative: 7 %
Lymphs Abs: 0.5 10*3/uL — ABNORMAL LOW (ref 0.7–4.0)
MCH: 32.9 pg (ref 26.0–34.0)
MCHC: 31.4 g/dL (ref 30.0–36.0)
MCV: 104.6 fL — ABNORMAL HIGH (ref 80.0–100.0)
Monocytes Absolute: 1 10*3/uL (ref 0.1–1.0)
Monocytes Relative: 17 %
Neutro Abs: 4.5 10*3/uL (ref 1.7–7.7)
Neutrophils Relative %: 71 %
Platelet Count: 416 10*3/uL — ABNORMAL HIGH (ref 150–400)
RBC: 2.8 MIL/uL — ABNORMAL LOW (ref 3.87–5.11)
RDW: 17.2 % — ABNORMAL HIGH (ref 11.5–15.5)
WBC Count: 6.3 10*3/uL (ref 4.0–10.5)
nRBC: 0 % (ref 0.0–0.2)

## 2024-12-11 LAB — CMP (CANCER CENTER ONLY)
ALT: 13 U/L (ref 0–44)
AST: 16 U/L (ref 15–41)
Albumin: 3.8 g/dL (ref 3.5–5.0)
Alkaline Phosphatase: 61 U/L (ref 38–126)
Anion gap: 12 (ref 5–15)
BUN: 8 mg/dL (ref 6–20)
CO2: 19 mmol/L — ABNORMAL LOW (ref 22–32)
Calcium: 9.3 mg/dL (ref 8.9–10.3)
Chloride: 108 mmol/L (ref 98–111)
Creatinine: 0.83 mg/dL (ref 0.44–1.00)
GFR, Estimated: >60 mL/min
Glucose, Bld: 151 mg/dL — ABNORMAL HIGH (ref 70–99)
Potassium: 3.8 mmol/L (ref 3.5–5.1)
Sodium: 139 mmol/L (ref 135–145)
Total Bilirubin: <0.2 mg/dL (ref 0.0–1.2)
Total Protein: 6.4 g/dL — ABNORMAL LOW (ref 6.5–8.1)

## 2024-12-11 LAB — TSH: TSH: 1.04 u[IU]/mL (ref 0.350–4.500)

## 2024-12-11 MED ORDER — SODIUM CHLORIDE 0.9 % IV SOLN
600.0000 mg/m2 | Freq: Once | INTRAVENOUS | Status: AC
  Administered 2024-12-11: 1160 mg via INTRAVENOUS
  Filled 2024-12-11: qty 58

## 2024-12-11 MED ORDER — DOXORUBICIN HCL CHEMO IV INJECTION 2 MG/ML
60.0000 mg/m2 | Freq: Once | INTRAVENOUS | Status: AC
  Administered 2024-12-11: 116 mg via INTRAVENOUS
  Filled 2024-12-11: qty 58

## 2024-12-11 MED ORDER — DEXAMETHASONE SOD PHOSPHATE PF 10 MG/ML IJ SOLN
10.0000 mg | Freq: Once | INTRAMUSCULAR | Status: AC
  Administered 2024-12-11: 10 mg via INTRAVENOUS
  Filled 2024-12-11: qty 1

## 2024-12-11 MED ORDER — SODIUM CHLORIDE 0.9 % IV SOLN
150.0000 mg | Freq: Once | INTRAVENOUS | Status: AC
  Administered 2024-12-11: 150 mg via INTRAVENOUS
  Filled 2024-12-11: qty 150

## 2024-12-11 MED ORDER — SODIUM CHLORIDE 0.9 % IV SOLN
INTRAVENOUS | Status: DC
  Filled 2024-12-11: qty 250

## 2024-12-11 MED ORDER — PALONOSETRON HCL INJECTION 0.25 MG/5ML
0.2500 mg | Freq: Once | INTRAVENOUS | Status: AC
  Administered 2024-12-11: 0.25 mg via INTRAVENOUS
  Filled 2024-12-11: qty 5

## 2024-12-11 NOTE — Assessment & Plan Note (Signed)
 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 Refer to neurology.

## 2024-12-11 NOTE — Assessment & Plan Note (Addendum)
 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  Per IR, right inguinal lymph node is not enlarged.  Previously on concurrent chemotherapy 5-FU and mitomycin  C with Radiation. S/p D1 5-FU and Mitomycin -C. D29 5-FU infusion was switched to concurrent Xeloda  due to lack of medi port.- she has picc line. Received D29 Mitomycin  C. Chemo was interrupted due to multi focal pneumonia.  S/p concurrent Xeloda  with Radiation [last RT 09/24/2024 .   Future plan - DRE 8-12 weeks after treatment - Jan 2026.-Recommend patient to follow-up with Dr. Tye for DRE in 2 weeks  I will re image after she finishes adjuvant breast chemotherapy

## 2024-12-11 NOTE — Assessment & Plan Note (Signed)
 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 mT1c [id number] Initial margin was positive and patient underwent reexcision and achieved a negative margin.  Recommend  adjuvant ddAC followed by weekly Taxol. Baseline Echo LVEF 60-65%.Rationale and side effects were reviewed with patient.  Labs are reviewed and discussed with patient. Proceed with cycle 3 ddAC with D3 GCSF.  I will postpone next treatment for a week - she will need to have DRE by Dr. Tye around 2 weeks time, and also tooth extraction. I expect her ANC is recovered to be > 1 by 2 weeks.  Next chemo will be in 3 weeks.  Continue supportive care.  Hydration sesstion.

## 2024-12-11 NOTE — Telephone Encounter (Signed)
 Spoke with patient re: FMLA needs

## 2024-12-11 NOTE — Assessment & Plan Note (Signed)
 She receives Day 3 long acting GCSF Neutropenia precaution.

## 2024-12-11 NOTE — Assessment & Plan Note (Addendum)
 Continue Lovenox  1 mg/kg twice daily. I recommend patient to hold off evening dose of Lovenox  prior to tooth extraction.

## 2024-12-11 NOTE — Progress Notes (Signed)
 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 mT1c pN66mi Initial margin was positive and patient underwent reexcision and achieved a negative margin.  Recommend  adjuvant ddAC followed by weekly Taxol. Baseline Echo LVEF 60-65%.Rationale and side effects were reviewed with patient.  Labs are reviewed and discussed with patient. Proceed with cycle 3 ddAC with D3 GCSF.  I will postpone next treatment for a week - she will need to have DRE by Dr. Tye around 2 weeks time, and also tooth extraction. I expect her ANC is recovered to be > 1 by 2 weeks.  Next chemo will be in 3 weeks.  Continue supportive care.  Hydration sesstion.  Acute subsegmental pulmonary embolism (HCC) Continue Lovenox  1 mg/kg twice daily. I recommend patient to hold off evening dose of Lovenox  prior to tooth extraction.   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  Per IR, right inguinal lymph node is not enlarged.  Previously on concurrent chemotherapy 5-FU and mitomycin  C with Radiation. S/p D1 5-FU and Mitomycin -C. D29 5-FU infusion was switched to concurrent Xeloda  due to lack of medi port.- she has picc line. Received D29 Mitomycin  C. Chemo was interrupted due to multi focal pneumonia.  S/p concurrent Xeloda  with Radiation [last RT 09/24/2024 .   Future plan - DRE 8-12 weeks after treatment - Jan 2026.-Recommend patient to follow-up with Dr. Tye for DRE in 2 weeks  I will re image after she finishes adjuvant breast chemotherapy  Chemotherapy induced neutropenia She receives Day 3 long acting GCSF Neutropenia  precaution.   Chemotherapy-induced nausea Continue home antiemetics phernegan and compazine  PRN.  Added lorazepam  0.5 every 12 hours as needed anxiety and nausea.     Pain, dental Poor dentition.  Encouraged patient to seek dental care. Add Peridex  oral solution twice daily Recommend local Orajel for pain control.  She may also use over-the-counter Tylenol  as needed. S/p amoxicillin  500 mg twice daily for 5 days while waiting for dental evaluation. Recommend checking CBC prior to dental extraction       Orders Placed This Encounter  Procedures   CBC with Differential (Cancer Center Only)    Standing Status:   Future    Expected Date:   01/01/2025    Expiration Date:   01/01/2026   CMP (Cancer Center only)    Standing Status:   Future    Expected Date:   01/01/2025    Expiration Date:   01/01/2026   CMP (Cancer Center only)    Standing Status:   Future    Expected Date:   12/18/2024    Expiration Date:   03/18/2025   CBC with Differential (Cancer Center Only)    Standing Status:   Future    Expected Date:   12/18/2024    Expiration Date:   03/18/2025   Amb Referral to Neuro Oncology    Referral Priority:   Routine    Referral Type:   Consultation    Referral Reason:   Specialty Services Required    Number of Visits Requested:   1   Follow-up per los All questions were answered. The patient knows to call the clinic with  any problems, questions or concerns.  [name], MD, PhD [facility] Hematology Oncology 12/11/2024    HISTORY OF PRESENTING ILLNESS:  Monica Gonzalez 49 y.o. 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 Summary of oncologic history is as follows: Oncology History  Anal squamous cell carcinoma (HCC)  05/29/2024 Initial Diagnosis   Anal squamous cell carcinoma   Patient has noticed a  hard mass at the anal area which she initially thought was a hemorrhoid.  The mass has been present for more than a month and appears to be enlarging. Patient was referred to establish care with surgeon Dr. Tye  05/16/2024, incisional biopsy of anal mass showed 1. Anus, biopsy, mass :       - INVASIVE MODERATELY DIFFERENTIATED SQUAMOUS CELL CARCINOMA  06/19/2024, left inguinal adenopathy biopsy showed Metastatic moderately differentiated squamous cell carcinoma.  '    05/29/2024 Cancer Staging   Staging form: Anus, AJCC V9 - Clinical stage from 05/29/2024: Stage IIIA (cT3, cN1a, cM0) - Signed by Cap Zelphia, MD on 06/26/2024 Stage prefix: Initial diagnosis   06/05/2024 Imaging   CT chest abdomen pelvis with contrast  Slight thickening in the area of the anal canal. Please correlate for history of neoplasm. There is a pathologic enlarged left inguinal node. There is a borderline right-sided node.   No additional areas of abnormal nodal enlargement or other aggressive appearing mass lesion at this time.   Fatty liver infiltration with known segment 7 hepatic hemangioma.   Of note there is a right-sided upper breast mass which was not clearly seen on prior CT scan. Please correlate for any prior study or if needed diagnostic mammographic evaluation and possible ultrasound when appropriate.     06/12/2024 Imaging   CT chest abdomen pelvis with contrast showed Slight thickening in the area of the anal canal. Please correlate for history of neoplasm. There is a pathologic enlarged left inguinal node. There is a borderline right-sided node.   No additional areas of abnormal nodal enlargement or other aggressive appearing mass lesion at this time.   Fatty liver infiltration with known segment 7 hepatic hemangioma.   Of note there is a right-sided upper breast mass which was not clearly seen on prior CT scan. Please correlate for any prior study or if needed diagnostic mammographic  evaluation and possible ultrasound when appropriate.   06/19/2024 Procedure   1. Lymph node, biopsy, left inguinal adenopathy :       -  METASTATIC MODERATELY DIFFERENTIATED SQUAMOUS CELL CARCINOMA    07/01/2024 Imaging   PET scan showed 1. Hypermetabolic anal mass with hypermetabolic left inguinal adenopathy, compatible with anorectal carcinoma. 2. Hypermetabolic right breast nodule with recent biopsies on 06/18/2024. Please correlate with pathology report.   07/31/2024 - 07/31/2024 Chemotherapy   Patient is on Treatment Plan : ANUS Mitomycin  D1,28 + 5FU D1-4, 28-31 q32d     08/23/2024 Genetic Testing   Negative genetic testing. No pathogenic variants identified on the [facility] CancerNext-Expanded+RNA Panel. The report date is 08/23/2024.  The CancerNext-Expanded gene panel offered by [facility] and includes sequencing, rearrangement, and RNA analysis for the following 77 genes: AIP, ALK, APC, ATM, AXIN2, BAP1, BARD1, BMPR1A, BRCA1, BRCA2, BRIP1, CDC73, CDH1, CDK4, CDKN1B, CDKN2A, CEBPA, CHEK2, CTNNA1, DDX41, DICER1, ETV6, FH, FLCN, GATA2, LZTR1, MAX, MBD4, MEN1, MET, MLH1, MSH2, MSH3, MSH6, MUTYH, NF1, NF2,  NTHL1, PALB2, PHOX2B, PMS2, POT1, PRKAR1A, PTCH1, PTEN, RAD51C, RAD51D, RB1, RET, RPS20, RUNX1, SDHA, SDHAF2, SDHB, SDHC, SDHD, SMAD4, SMARCA4, SMARCB1, SMARCE1, STK11, SUFU, TMEM127, TP53, TSC1, TSC2, VHL, and WT1 (sequencing and deletion/duplication); EGFR, HOXB13, KIT, MITF, PDGFRA, POLD1, and POLE (sequencing only); EPCAM and GREM1 (deletion/duplication only).    08/29/2024 - 08/29/2024 Chemotherapy   Patient is on Treatment Plan : ANUS Mitomycin  D1,29 + Capecitabine  + XRT     Invasive carcinoma of breast (HCC)  06/12/2024 Imaging   CT chest abdomen pelvis with contrast showed Slight thickening in the area of the anal canal. Please correlate for history of neoplasm. There is a pathologic enlarged left inguinal node. There is a borderline right-sided node.   No additional areas of  abnormal nodal enlargement or other aggressive appearing mass lesion at this time.   Fatty liver infiltration with known segment 7 hepatic hemangioma.   Of note there is a right-sided upper breast mass which was not clearly seen on prior CT scan. Please correlate for any prior study or if needed diagnostic mammographic evaluation and possible ultrasound when appropriate.   06/17/2024 Mammogram   1. There is a suspicious 17 mm mass at the site of CT and mammographic concern in the RIGHT breast. It demonstrates associated pleomorphic calcifications. Recommend ultrasound-guided biopsy for definitive characterization. 2. There is incidental sonographic note of a 7 mm mass in the RIGHT breast at 12 o'clock and a 6 mm non mass area at 9 o'clock. Recommend ultrasound-guided biopsy of these 2 areas for definitive characterization given suspicious appearance of the dominant mass. 3. No suspicious RIGHT axillary adenopathy. 4. No mammographic evidence of malignancy in the LEFT breast.   06/18/2024 Cancer Staging   Staging form: Breast, AJCC 8th Edition - Clinical stage from 06/18/2024: Stage IB (cT1b, cN0, cM0, G3, ER-, PR-, HER2-) - Signed by Babara Call, MD on 06/26/2024 Stage prefix: Initial diagnosis Histologic grading system: 3 grade system   06/18/2024 Initial Diagnosis   Invasive carcinoma of breast [facility])  Patient with right breast biopsy.  Pathology showed 1. Breast, right, needle core biopsy, 12:00 12cmfn (heart clip) :      INVASIVE DUCTAL CARCINOMA      TUBULE FORMATION: SCORE 3      NUCLEAR PLEOMORPHISM: SCORE 3      MITOTIC COUNT: SCORE 2      TOTAL SCORE: 8      OVERALL GRADE: 3      LYMPHOVASCULAR INVASION: NOT IDENTIFIED      CANCER LENGTH: 1.0 CM      CALCIFICATIONS: NOT IDENTIFIED      OTHER FINDINGS: NONE      ER negative, PR negative, HER2 negative (IHC 0) Ki-67 95%.       2. Breast, right, needle core biopsy, 12:00 8cmfn (coil clip) :      INVASIVE DUCTAL CARCINOMA       DUCTAL CARCINOMA IN SITU, SOLID, HIGH NUCLEAR GRADE WITH NECROSIS      TUBULE FORMATION: SCORE 3      NUCLEAR PLEOMORPHISM: SCORE 3      MITOTIC COUNT: SCORE 2      TOTAL SCORE: 8      OVERALL GRADE: 3      LYMPHOVASCULAR INVASION: NOT IDENTIFIED      CANCER LENGTH: 0.3 CM      CALCIFICATIONS: NOT IDENTIFIED      OTHER FINDINGS: NONE            3. Breast, right, needle core biopsy, 9:00  12cmfn (venus) :      BENIGN BREAST TISSUE WITH DENSE STROMAL FIBROSIS.      NEGATIVE FOR ATYPIA OR MALIGNANCY.    07/08/2024 Surgery   Patient went right breast lumpectomy and sentinel lymph node biopsy.  1. 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      - DUCTAL CARCINOMA IN SITU (DCIS).      - SEE CANCER SUMMARY AND NOTE BELOW.      - TWO BIOPSY SITES WITH CORRESPONDING HEART AND COIL CLIPS.      - TWO SAVI SCOUT TAGS.       2. Lymph node, sentinel, biopsy, Right :      - MICROMETASTATIC CARCINOMA INVOLVES ONE OF FIVE LYMPH NODES (1MI/5).      - SEE CANCER SUMMARY AND NOTE BELOW.       3. Breast, excision, Right medial posterior margin :      - BENIGN BREAST TISSUE.      - NEGATIVE FOR ATYPIA AND MALIGNANCY.      - SEE CANCER SUMMARY BELOW.   TUMOR Histologic Type: Invasive carcinoma of no special type (ductal) Histologic Grade (Nottingham Histologic Score) Glandular (Acinar)/Tubular Differentiation: 3 Nuclear Pleomorphism: 3 Mitotic Rate: 3 Overall Grade: 3 Tumor Size: Greatest dimension of largest invasive focus: 19 mm Ductal Carcinoma In Situ (DCIS): Present, high-grade with comedonecrosis Lymphatic and/or Vascular Invasion: Present, extensive (LVI in 2 or more blocks)  Treatment Effect in the Breast: No known presurgical therapy MARGINS Margin Status for Invasive Carcinoma: Invasive Carcinoma involves inferior (unifocal) and superior (unifocal) margins and is 0.5 mm to anterior margin Margin Status for DCIS: All  margins negative for DCIS Distance from DCIS to closest margin: 0.5 mm Specify closest margin: Inferior REGIONAL LYMPH NODES Regional Lymph Node Status: Tumor present in regional lymph node(s) Number of Lymph Nodes with Macrometastases (greater than 2 mm): 0 Number of Lymph Nodes with Micrometastases (greater than 0.2 mm to 2 mm and/or greater than 200 cells): 1 Number of Lymph Nodes with Isolated Tumor Cells (0.2 mm or less OR 200 cells or less): 0 Size of Largest Metastatic Deposit: 1.9 mm Extranodal Extension: Not identified Total Number of Lymph Nodes Examined (sentinel and non-sentinel): 5 Number of Sentinel Nodes Examined: 5 DISTANT METASTASIS Distant Site(s) Involved, if applicable: Not applicable PATHOLOGIC STAGE CLASSIFICATION (pTNM, AJCC 8th Edition): Modified Classification: Not applicable pT Category: pT1c T Suffix: (m) multiple primary synchronous tumors in a single organ pN Category: pN52mi N Suffix: (sn) pM Category: Not applicable SPECIAL STUDIES Breast Biomarker Testing Performed on Previous Biopsy: [id number] (heart) Estrogen Receptor: Negative (0%) Progesterone Receptor: Negative (0%) HER2 IHC: Negative (0) Proliferation Marker Ki67: 95%   07/22/2024 Cancer Staging   Staging form: Breast, AJCC 8th Edition - Pathologic stage from 07/22/2024: Stage IB (pT1c, pN2mi(sn), cM0, G3, ER-, PR-, HER2-) - Signed by Babara Call, MD on 07/22/2024 Stage prefix: Initial diagnosis Method of lymph node assessment: Sentinel lymph node biopsy Multigene prognostic tests performed: None Histologic grading system: 3 grade system   07/25/2024 Surgery   Patient reports additional excision for positive margin.  1. Breast, excision, superior margin of right breast - RESIDUAL INVASIVE DUCTAL CARCINOMA, COMPLETELY EXCISED. - CARCINOMA PRESENT 2 MM FROM THE POSTERIOR MARGIN AND 4 MM FROM THE ANTERIOR AND SUPERIOR MARGINS. 2. Breast, excision, inferior margin of right breast - RESIDUAL  INVASIVE DUCTAL CARCINOMA, COMPLETELY EXCISED. - CARCINOMA PRESENT 1 MM FROM THE NEW  INFERIOR MARGIN.   08/23/2024 Genetic Testing   Negative genetic testing. No pathogenic variants identified on the [facility] CancerNext-Expanded+RNA Panel. The report date is 08/23/2024.  The CancerNext-Expanded gene panel offered by [facility] and includes sequencing, rearrangement, and RNA analysis for the following 77 genes: AIP, ALK, APC, ATM, AXIN2, BAP1, BARD1, BMPR1A, BRCA1, BRCA2, BRIP1, CDC73, CDH1, CDK4, CDKN1B, CDKN2A, CEBPA, CHEK2, CTNNA1, DDX41, DICER1, ETV6, FH, FLCN, GATA2, LZTR1, MAX, MBD4, MEN1, MET, MLH1, MSH2, MSH3, MSH6, MUTYH, NF1, NF2, NTHL1, PALB2, PHOX2B, PMS2, POT1, PRKAR1A, PTCH1, PTEN, RAD51C, RAD51D, RB1, RET, RPS20, RUNX1, SDHA, SDHAF2, SDHB, SDHC, SDHD, SMAD4, SMARCA4, SMARCB1, SMARCE1, STK11, SUFU, TMEM127, TP53, TSC1, TSC2, VHL, and WT1 (sequencing and deletion/duplication); EGFR, HOXB13, KIT, MITF, PDGFRA, POLD1, and POLE (sequencing only); EPCAM and GREM1 (deletion/duplication only).    10/23/2024 -  Chemotherapy   Patient is on Treatment Plan : BREAST DOSE DENSE AC q14d / PACLitaxel q7d      Discussed the use of AI scribe software for clinical note transcription with the patient, who gave verbal consent to proceed.   Patient has a history of VIN 3 status post vulvectomy  07/11/2024,she was evaluated by cardiology with palpitations and atypical chest pain. Chest pain deemed atypical and she went on to have a normal echo at 07/19/2024.   07/23/2024, patient was seen by symptom management for right neck pain.  She was seen by Tinnie Dawn.  Right upper extremity ultrasound was obtained which showed acute occlusive DVT involving the right internal jugular and subclavian veins.  Patient was started on Lovenox , she was supposed to be started on 1 mg/kg twice daily however was started off 40 mg twice daily which is a subtherapeutic dosage. She was asked to hold off the evening dose prior to  her breast re-excision.  07/29/2024, patient presented emergency room due to shortness of breath and chest pain.  CTA angiogram showed subsegmental PE medially in the left lower lobe.  Shortness of breath was felt to be multifactorial secondary to asthma/bronchitis, pulmonary embolism.  Symptoms are better after supportive care and she was discharged on Lovenox  80 mg subcutaneously twice daily.  08/31/2024 -09/06/2024 hospitalized due to sepsis due to multifocal pneumonia, UTI. Respiratory viral panel positive for metapneumovirus  Patient resumed on radiation on 09/16/24.  Patient took Xeloda  twice daily and continued radiation till 09/19/2024.  She was not able to continue radiation on 09/20/2024 due to severe diarrhea.  11/7 radiation was not given due to patient's symptoms Final radiation was done on 09/25/2024. Last dose of Xeloda  was omitted.     INTERVAL HISTORY Monica Gonzalez is a 49 y.o. female who has above history reviewed by me today presents for follow up visit prior to chemo  Discussed the use of AI scribe software for clinical note transcription with the patient, who gave verbal consent to proceed.   Rectal symptoms are improving, though the patient reports feeling two small areas of tenderness. No current rectal pain or bleeding.  Dental pain is present due to two teeth requiring extraction. she recently completed a course of amoxicillin  for infection prophylaxis.  She reports significant fatigue requiring prolonged daytime naps and generalized arthralgias. 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 She denies abnormal bleeding or bruising, and her platelet counts have remained stable.  She has longstanding peripheral neuropathy, which has markedly worsened recently. She describes her neuropathy as ten times  worse, with pronounced  tremor, impaired fine motor coordination requiring both hands to stabilize objects, and balance difficulties. She is currently taking gabapentin  600 mg three times daily without benefit. Celecoxib was previously discontinued by another provider. She has not yet been evaluated by neurology, though a referral has been placed.  She is also managing a dental infection requiring extraction, previously treated with amoxicillin . The dental procedure has been postponed multiple times due to neutropenia and is now tentatively planned for the week of February 11th, contingent on adequate neutrophil counts. She is also due for a digital rectal examination, which will be coordinated to avoid periods of neutropenia.  MEDICAL HISTORY:  Past Medical History:  Diagnosis Date   Anemia    Anginal pain    Anxiety    Arthritis    Asthma    Cancer (HCC)    rectal cancer, breast cancer 2025   Chronic pain syndrome 05/21/2023   DDD (degenerative disc disease), cervical 05/22/2023   Deep venous thrombosis (HCC)    Depression    Dyspnea    Fibromyalgia    GERD (gastroesophageal reflux disease)    Headache    Heart murmur    History of hiatal hernia    History of kidney stones    Lupus    Malignant neoplasm of upper-outer quadrant of right female breast, unspecified estrogen receptor status (HCC) 2025   Pneumonia    PTSD (post-traumatic stress disorder)    Skin candidiasis 05/29/2024   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    SURGICAL HISTORY: Past Surgical History:  Procedure Laterality Date   BREAST BIOPSY Right 06/18/2024   US  RT BREAST BX W LOC DEV 1ST LESION IMG BX SPEC US  GUIDE 06/18/2024 [facility]   BREAST BIOPSY Right 06/18/2024   US  RT BREAST BX W LOC DEV EA ADD LESION IMG BX SPEC US  GUIDE 06/18/2024 [facility]   BREAST BIOPSY Right 06/18/2024   US  RT BREAST BX W LOC DEV EA ADD LESION IMG BX SPEC US  GUIDE 06/18/2024 [facility]   BREAST BIOPSY Right 07/05/2024    US  RT BREAST SAVI/RF TAG 1ST LESION US  GUIDE 07/05/2024 [facility]   BREAST BIOPSY Right 07/05/2024   US  RT BREAST SAVI/RF TAG EA ADD'L LESION US  GUIDE 07/05/2024 [facility]   BREAST LUMPECTOMY Right 07/25/2024   Procedure: BREAST LUMPECTOMY;  Surgeon: Tye Millet, DO;  Location: [facility];  Service: General;  Laterality: Right;   BREAST LUMPECTOMY WITH RADIO FREQUENCY LOCALIZER Right 07/08/2024   Procedure: BREAST LUMPECTOMY WITH RADIO FREQUENCY LOCALIZER;  Surgeon: Tye Millet, DO;  Location: [facility];  Service: General;  Laterality: Right;   COLONOSCOPY WITH PROPOFOL  N/A 02/17/2023   Procedure: COLONOSCOPY WITH PROPOFOL ;  Surgeon: Maryruth Ole DASEN, MD;  Location: [facility];  Service: Endoscopy;  Laterality: N/A;   ESOPHAGOGASTRODUODENOSCOPY (EGD) WITH PROPOFOL  N/A 02/17/2023   Procedure: ESOPHAGOGASTRODUODENOSCOPY (EGD) WITH PROPOFOL ;  Surgeon: Maryruth Ole DASEN, MD;  Location: [facility];  Service: Endoscopy;  Laterality: N/A;   EXTRACORPOREAL SHOCK WAVE LITHOTRIPSY     IR CV LINE INJECTION  08/21/2024   IR REMOVAL TUN ACCESS W/ PORT W/O FL MOD SED  08/28/2024   PICC LINE INSERTION N/A 08/2024   PORTA CATH INSERTION N/A 10/14/2024   Procedure: PORTA CATH INSERTION;  Surgeon: Marea Selinda RAMAN, MD;  Location: [facility];  Service: Cardiovascular;  Laterality: N/A;   PORTA CATH REMOVAL N/A 10/14/2024   Procedure: PORTA CATH REMOVAL;  Surgeon: Marea Selinda RAMAN, MD;  Location: [facility];  Service: Cardiovascular;  Laterality: N/A;   PORTACATH PLACEMENT Right 07/08/2024   Procedure: INSERTION, TUNNELED CENTRAL VENOUS DEVICE, WITH PORT;  Surgeon: Tye Millet, DO;  Location: [facility];  Service: General;  Laterality: Right;   RECTAL EXAM UNDER ANESTHESIA N/A 05/16/2024   Procedure: EXAM UNDER ANESTHESIA, RECTUM, INCISIONAL BIOPSY OF ANAL MASS;  Surgeon: Tye Millet, DO;  Location: [facility];  Service: General;  Laterality: N/A;   SENTINEL NODE BIOPSY Right  07/08/2024   Procedure: BIOPSY, LYMPH NODE, SENTINEL;  Surgeon: Tye Millet, DO;  Location: [facility];  Service: General;  Laterality: Right;   TONSILLECTOMY     TUBAL LIGATION     VULVECTOMY N/A 07/29/2020   Procedure: WIDE EXCISION VULVECTOMY;  Surgeon: Mancil Barter, MD;  Location: [facility];  Service: Gynecology;  Laterality: N/A;    SOCIAL HISTORY: Social History   Socioeconomic History   Marital status: Divorced    Spouse name: Not on file   Number of children: 1   Years of education: Not on file   Highest education level: Not on file  Occupational History   Not on file  Tobacco Use   Smoking status: Former    Types: Cigarettes   Smokeless tobacco: Never  Vaping Use   Vaping status: Never Used  Substance and Sexual Activity   Alcohol use: Not Currently   Drug use: No   Sexual activity: Yes  Other Topics Concern   Not on file  Social History Narrative   Not on file   Social Drivers of Health   Tobacco Use: Medium Risk (12/11/2024)   Patient History    Smoking Tobacco Use: Former    Smokeless Tobacco Use: Never    Passive Exposure: Not on file  Financial Resource Strain: Low Risk  (11/29/2024)   Received from [facility]   Overall Financial Resource Strain (CARDIA)    Difficulty of Paying Living Expenses: Not hard at all  Food Insecurity: No Food Insecurity (11/29/2024)   [facility]   Epic    Within the past 12 months, you worried that your food would run out before you got the money to buy more.: Never true    Within the past 12 months, the food you bought just didn't last and you didn't have money to get more.: Never true  Transportation Needs: No Transportation Needs (11/29/2024)   Received from [facility] - Transportation    In the past 12 months, has lack of transportation kept you from medical appointments or from getting medications?: No    Lack of Transportation  (Non-Medical): No  Physical Activity: Not on file  Stress: Not on file  Social Connections: Not on file  Intimate Partner Violence: Not At Risk (09/07/2024)   Epic    Fear of Current or Ex-Partner: No    Emotionally Abused: No    Physically Abused: No    Sexually Abused: No  Depression (PHQ2-9): Low Risk (12/04/2024)   Depression (PHQ2-9)    PHQ-2 Score: 0  Alcohol Screen: Low Risk (10/16/2024)   Alcohol Screen    Last Alcohol Screening Score (AUDIT): 0  Housing: Low Risk  (11/29/2024)   Received from [facility]   Epic    In the last 12 months, was there a time when you were not able to pay the mortgage or rent on time?: No    In the past 12 months, how many times have you  moved where you were living?: 0    At any time in the past 12 months, were you homeless or living in a shelter (including now)?: No  Utilities: Not At Risk (11/29/2024)   Received from [facility]   Epic    In the past 12 months has the electric, gas, oil, or water  company threatened to shut off services in your home?: No  Health Literacy: Not on file    FAMILY HISTORY: Family History  Problem Relation Age of Onset   Diabetes Mother    Congestive Heart Failure Mother    Colon cancer Father 61   Lupus Maternal Grandmother    Cancer Maternal Grandfather        either prostate or colon   Colon cancer Paternal Grandfather    Lung cancer Paternal Grandfather     ALLERGIES:  is allergic to prednisone and silicone.  MEDICATIONS:  Current Outpatient Medications  Medication Sig Dispense Refill   acetaminophen  (TYLENOL ) 500 MG tablet Take 500 mg by mouth every 6 (six) hours as needed.     albuterol  (VENTOLIN  HFA) 108 (90 Base) MCG/ACT inhaler Inhale 2 puffs into the lungs every 4 (four) hours as needed for wheezing or shortness of breath. 8.5 g 2   alum & mag hydroxide-simeth-nystatin -diphenhydrAMINE  Take 5 mLs by mouth 3 (three) times daily for 15 days. Suspension contains  equal amounts of Maalox Extra Strength, nystatin , and diphenhydramine . 225 mL 0   ascorbic acid (VITAMIN C) 500 MG tablet Take 500 mg by mouth daily.     azelastine  (ASTELIN ) 0.1 % nasal spray Place 1 spray into both nostrils 2 (two) times daily. Use in each nostril as directed 30 mL 0   benzonatate  (TESSALON ) 200 MG capsule Take 1 capsule (200 mg total) by mouth 3 (three) times daily as needed for cough. 30 capsule 1   BIOTIN PO Take by mouth daily.     budesonide  (PULMICORT ) 0.25 MG/2ML nebulizer solution Take 2 mLs (0.25 mg total) by nebulization 2 (two) times daily. 60 mL 12   busPIRone  (BUSPAR ) 5 MG tablet Take 5 mg by mouth 2 (two) times daily.     chlorhexidine  (PERIDEX ) 0.12 % solution Use as directed 10 mLs in the mouth or throat 2 (two) times daily. 473 mL 0   cholecalciferol (VITAMIN D3) 25 MCG (1000 UNIT) tablet Take 1,000 Units by mouth daily.     clonazePAM  (KLONOPIN ) 0.5 MG tablet Take 0.5 mg by mouth at bedtime as needed for anxiety.     dexamethasone  (DECADRON ) 4 MG tablet Take 2 tablets (8 mg total) by mouth daily for 2 days. Start the day after doxorubicin /cyclophosphamide  chemotherapy. Take with food. 30 tablet 1   diphenoxylate -atropine  (LOMOTIL ) 2.5-0.025 MG tablet Take 1 tablet by mouth 4 (four) times daily. 90 tablet 0   DULoxetine  (CYMBALTA ) 30 MG capsule Take 30 mg by mouth at bedtime.     enoxaparin  (LOVENOX ) 80 MG/0.8ML injection Inject 0.8 mLs (80 mg total) into the skin every 12 (twelve) hours. 48 mL 2   esomeprazole  (NEXIUM ) 40 MG capsule Take 1 capsule (40 mg total) by mouth daily. 90 capsule 1   fluconazole  (DIFLUCAN ) 100 MG tablet Take 1 tablet (100 mg total) by mouth daily for 14 days. 14 tablet 0   gabapentin  (NEURONTIN ) 300 MG capsule Take 2 capsules (600 mg total) by mouth 3 (three) times daily. 180 capsule 2   ibuprofen  (ADVIL ) 200 MG tablet Take 200 mg by mouth every 6 (six) hours  as needed.     ipratropium-albuterol  (DUONEB) 0.5-2.5 (3) MG/3ML SOLN Take 3  mLs by nebulization 4 (four) times daily. 360 mL 3   lactose free nutrition (BOOST PLUS) LIQD Take 237 mLs by mouth 2 (two) times daily between meals. 237 mL 11   lidocaine -prilocaine  (EMLA ) cream Apply 1 Application topically as needed. 30 g 3   linaclotide (LINZESS) 145 MCG CAPS capsule Take 145 mcg by mouth daily before breakfast.     LORazepam  (ATIVAN ) 0.5 MG tablet Take 1 tablet (0.5 mg total) by mouth every 12 (twelve) hours as needed for anxiety (nausea). 30 tablet 0   megestrol  (MEGACE ) 40 MG tablet Take 2 tablets (80 mg total) by mouth 2 (two) times daily. 120 tablet 0   montelukast  (SINGULAIR ) 10 MG tablet Take 1 tablet (10 mg total) by mouth See admin instructions. Take 1 tablet on the day prior to chemotherapy and take 1 tablet daily for 2 days after chemotherapy. 30 tablet 0   Multiple Vitamin (MULTIVITAMIN WITH MINERALS) TABS tablet Take 1 tablet by mouth daily.     nystatin  (MYCOSTATIN ) 100000 UNIT/ML suspension Take 5 mLs (500,000 Units total) by mouth 4 (four) times daily. Swish and swallow 473 mL 1   oxyCODONE  (OXY IR/ROXICODONE ) 5 MG immediate release tablet Take 1 tablet (5 mg total) by mouth every 6 (six) hours as needed for severe pain (pain score 7-10) or breakthrough pain. 15 tablet 0   potassium chloride  SA (KLOR-CON  M) 20 MEQ tablet Take 1 tablet (20 mEq total) by mouth 2 (two) times daily. 60 tablet 0   prochlorperazine  (COMPAZINE ) 10 MG tablet Take 1 tablet (10 mg total) by mouth every 6 (six) hours as needed for nausea or vomiting. 30 tablet 1   promethazine  (PHENERGAN ) 25 MG tablet Take 1 tablet (25 mg total) by mouth every 8 (eight) hours as needed for nausea or vomiting. 60 tablet 0   traZODone  (DESYREL ) 100 MG tablet Take 100 mg by mouth at bedtime.     naloxone  (NARCAN ) nasal spray 4 mg/0.1 mL SPRAY 1 SPRAY INTO ONE NOSTRIL AS DIRECTED FOR OPIOID OVERDOSE (TURN PERSON ON SIDE AFTER DOSE. IF NO RESPONSE IN 2-3 MINUTES OR PERSON RESPONDS BUT RELAPSES, REPEAT USING A NEW  SPRAY DEVICE AND SPRAY INTO THE OTHER NOSTRIL. CALL 911 AFTER USE.) * EMERGENCY USE ONLY * (Patient not taking: Reported on 12/11/2024) 1 each 0   No current facility-administered medications for this visit.    Review of Systems  Constitutional:  Positive for appetite change and fatigue. Negative for chills, fever and unexpected weight change.  HENT:   Negative for hearing loss and voice change.        Tooth pain  Eyes:  Negative for eye problems.  Respiratory:  Negative for chest tightness, cough and shortness of breath.   Cardiovascular:  Negative for chest pain.  Gastrointestinal:  Positive for nausea. Negative for abdominal distention, abdominal pain, blood in stool and vomiting.  Endocrine: Negative for hot flashes.  Genitourinary:  Negative for difficulty urinating, dysuria and frequency.   Musculoskeletal:  Positive for neck pain. Negative for arthralgias.  Skin:  Negative for itching and rash.  Neurological:  Positive for numbness. Negative for extremity weakness.  Hematological:  Negative for adenopathy.  Psychiatric/Behavioral:  Negative for confusion.      PHYSICAL EXAMINATION: ECOG PERFORMANCE STATUS: 1 - Symptomatic but completely ambulatory  Vitals:   12/11/24 1316  BP: 110/74  Pulse: (!) 112  Resp: 18  Temp: 98.9 F (  37.2 C)  SpO2: 99%   Filed Weights   12/11/24 1316  Weight: 184 lb 12.8 oz (83.8 kg)    Physical Exam Constitutional:      General: She is not in acute distress.    Appearance: She is not diaphoretic.  HENT:     Head: Normocephalic and atraumatic.     Mouth/Throat:     Pharynx: No oropharyngeal exudate.  Eyes:     General: No scleral icterus. Cardiovascular:     Rate and Rhythm: Normal rate and regular rhythm.     Heart sounds: No murmur heard. Pulmonary:     Effort: Pulmonary effort is normal. No respiratory distress.     Breath sounds: No wheezing.  Abdominal:     General: There is no distension.     Palpations: Abdomen is soft.      Tenderness: There is no abdominal tenderness.  Musculoskeletal:        General: Normal range of motion.     Cervical back: Normal range of motion and neck supple.  Skin:    General: Skin is warm and dry.     Findings: No erythema.  Neurological:     Mental Status: She is alert and oriented to person, place, and time. Mental status is at baseline.     Motor: No abnormal muscle tone.  Psychiatric:        Mood and Affect: Affect normal.         LABORATORY DATA:  I have reviewed the data as listed    Latest Ref Rng & Units 12/11/2024    1:06 PM 12/04/2024    1:25 PM 12/02/2024    1:20 PM  CBC  WBC 4.0 - 10.5 K/uL 6.3  8.3  5.8   Hemoglobin 12.0 - 15.0 g/dL 9.2  8.6  9.1   Hematocrit 36.0 - 46.0 % 29.3  26.8  28.5   Platelets 150 - 400 K/uL 416  125  97       Latest Ref Rng & Units 12/11/2024    1:06 PM 12/04/2024    1:25 PM 12/02/2024    1:20 PM  CMP  Glucose 70 - 99 mg/dL 848  94  857   BUN 6 - 20 mg/dL 8  8  7    Creatinine 0.44 - 1.00 mg/dL 9.16  9.21  9.20   Sodium 135 - 145 mmol/L 139  140  138   Potassium 3.5 - 5.1 mmol/L 3.8  4.3  3.6   Chloride 98 - 111 mmol/L 108  109  106   CO2 22 - 32 mmol/L 19  21  20    Calcium 8.9 - 10.3 mg/dL 9.3  9.5  9.2   Total Protein 6.5 - 8.1 g/dL 6.4  6.0  6.3   Total Bilirubin 0.0 - 1.2 mg/dL <9.7  0.3  0.2   Alkaline Phos 38 - 126 U/L 61  71  74   AST 15 - 41 U/L 16  17  18    ALT 0 - 44 U/L 13  16  20       RADIOGRAPHIC STUDIES: I have personally reviewed the radiological images as listed and agreed with the findings in the report. DG Abd 2 Views Result Date: 11/27/2024 CLINICAL DATA:  Abdominal pain EXAM: ABDOMEN - 2 VIEW COMPARISON:  12/24/2018, CT 08/17/2024 FINDINGS: Central venous catheter tip in the right atrium. No free air beneath the diaphragm. Nonobstructed gas pattern. Phleboliths in the pelvis. IMPRESSION: Nonobstructed gas pattern. Electronically Signed  By: Luke Bun M.D.   On: 11/27/2024 17:33     "

## 2024-12-11 NOTE — Assessment & Plan Note (Signed)
 Continue home antiemetics phernegan and compazine  PRN.  Added lorazepam  0.5 every 12 hours as needed anxiety and nausea.

## 2024-12-11 NOTE — Telephone Encounter (Signed)
 Spoke with patient to clarify her desires for FMLA. She would like continuous leave from 12/04/24-01/12/25. After this she would like to apply for intermittent fmla. Her short term disability has run out.

## 2024-12-11 NOTE — Assessment & Plan Note (Signed)
 Poor dentition.  Encouraged patient to seek dental care. Add Peridex  oral solution twice daily Recommend local Orajel for pain control.  She may also use over-the-counter Tylenol  as needed. S/p amoxicillin  500 mg twice daily for 5 days while waiting for dental evaluation. Recommend checking CBC prior to dental extraction

## 2024-12-12 ENCOUNTER — Encounter: Payer: Self-pay | Admitting: Oncology

## 2024-12-13 ENCOUNTER — Inpatient Hospital Stay

## 2024-12-13 ENCOUNTER — Encounter: Payer: Self-pay | Admitting: Internal Medicine

## 2024-12-13 ENCOUNTER — Encounter: Payer: Self-pay | Admitting: Oncology

## 2024-12-13 ENCOUNTER — Inpatient Hospital Stay: Admitting: Internal Medicine

## 2024-12-13 VITALS — BP 102/70 | HR 97 | Temp 97.6°F | Resp 20 | Wt 186.5 lb

## 2024-12-13 DIAGNOSIS — G620 Drug-induced polyneuropathy: Secondary | ICD-10-CM | POA: Diagnosis not present

## 2024-12-13 DIAGNOSIS — C50919 Malignant neoplasm of unspecified site of unspecified female breast: Secondary | ICD-10-CM

## 2024-12-13 DIAGNOSIS — T451X5A Adverse effect of antineoplastic and immunosuppressive drugs, initial encounter: Secondary | ICD-10-CM

## 2024-12-13 DIAGNOSIS — R251 Tremor, unspecified: Secondary | ICD-10-CM | POA: Insufficient documentation

## 2024-12-13 DIAGNOSIS — C210 Malignant neoplasm of anus, unspecified: Secondary | ICD-10-CM | POA: Diagnosis not present

## 2024-12-13 MED ORDER — PROPRANOLOL HCL 20 MG PO TABS: 20.0000 mg | ORAL_TABLET | Freq: Two times a day (BID) | ORAL | 2 refills | Status: AC

## 2024-12-13 MED ORDER — DULOXETINE HCL 60 MG PO CPEP: 60.0000 mg | ORAL_CAPSULE | Freq: Every day | ORAL | 2 refills | Status: AC

## 2024-12-13 MED ORDER — PEGFILGRASTIM-CBQV 6 MG/0.6ML ~~LOC~~ SOSY
6.0000 mg | PREFILLED_SYRINGE | Freq: Once | SUBCUTANEOUS | Status: AC
  Administered 2024-12-13: 6 mg via SUBCUTANEOUS
  Filled 2024-12-13: qty 0.6

## 2024-12-13 NOTE — Telephone Encounter (Signed)
 Fmla faxed to [employer]

## 2024-12-13 NOTE — Progress Notes (Signed)
 "  [facility] [address]  [place], [place] [address] [phone]   New Patient Evaluation  Date of Service: 12/13/24 Patient Name: Monica Gonzalez Patient MRN: [id number] Patient DOB: 07-17-1976 Provider: Arthea MARLA Manns, MD  Identifying Statement:  Monica Gonzalez is a 49 y.o. female with Chemotherapy-induced neuropathy  Tremor who presents for initial consultation and evaluation regarding cancer associated neurologic deficits.    Referring Provider: Epifanio Alm SQUIBB, MD [address] [place],  [place] [address]  Primary Cancer:  Oncologic History: Oncology History  Anal squamous cell carcinoma (HCC)  05/29/2024 Initial Diagnosis   Anal squamous cell carcinoma   Patient has noticed a hard mass at the anal area which she initially thought was a hemorrhoid.  The mass has been present for more than a month and appears to be enlarging. Patient was referred to establish care with surgeon Dr. Tye  05/16/2024, incisional biopsy of anal mass showed 1. Anus, biopsy, mass :       - INVASIVE MODERATELY DIFFERENTIATED SQUAMOUS CELL CARCINOMA  06/19/2024, left inguinal adenopathy biopsy showed Metastatic moderately differentiated squamous cell carcinoma.  '    05/29/2024 Cancer Staging   Staging form: Anus, AJCC V9 - Clinical stage from 05/29/2024: Stage IIIA (cT3, cN1a, cM0) - Signed by Babara Call, MD on 06/26/2024 Stage prefix: Initial diagnosis   06/05/2024 Imaging   CT chest abdomen pelvis with contrast  Slight thickening in the area of the anal canal. Please correlate for history of neoplasm. There is a pathologic enlarged left inguinal node. There is a borderline right-sided node.   No additional areas of abnormal nodal enlargement or other aggressive appearing mass lesion at this time.   Fatty liver infiltration with known segment 7 hepatic hemangioma.   Of note there is a right-sided upper breast mass which was not clearly seen  on prior CT scan. Please correlate for any prior study or if needed diagnostic mammographic evaluation and possible ultrasound when appropriate.     06/12/2024 Imaging   CT chest abdomen pelvis with contrast showed Slight thickening in the area of the anal canal. Please correlate for history of neoplasm. There is a pathologic enlarged left inguinal node. There is a borderline right-sided node.   No additional areas of abnormal nodal enlargement or other aggressive appearing mass lesion at this time.   Fatty liver infiltration with known segment 7 hepatic hemangioma.   Of note there is a right-sided upper breast mass which was not clearly seen on prior CT scan. Please correlate for any prior study or if needed diagnostic mammographic evaluation and possible ultrasound when appropriate.   06/19/2024 Procedure   1. Lymph node, biopsy, left inguinal adenopathy :       -  METASTATIC MODERATELY DIFFERENTIATED SQUAMOUS CELL CARCINOMA    07/01/2024 Imaging   PET scan showed 1. Hypermetabolic anal mass with hypermetabolic left inguinal adenopathy, compatible with anorectal carcinoma. 2. Hypermetabolic right breast nodule with recent biopsies on 06/18/2024. Please correlate with pathology report.   07/31/2024 - 07/31/2024 Chemotherapy   Patient is on Treatment Plan : ANUS Mitomycin  D1,28 + 5FU D1-4, 28-31 q32d     08/23/2024 Genetic Testing   Negative genetic testing. No pathogenic variants identified on the [facility] CancerNext-Expanded+RNA Panel. The report date is 08/23/2024.  The CancerNext-Expanded gene panel offered by [facility] and includes sequencing, rearrangement, and RNA analysis for the following 77 genes: AIP, ALK, APC, ATM, AXIN2, BAP1, BARD1, BMPR1A, BRCA1, BRCA2,  BRIP1, CDC73, CDH1, CDK4, CDKN1B, CDKN2A, CEBPA, CHEK2, CTNNA1, DDX41, DICER1, ETV6, FH, FLCN, GATA2, LZTR1, MAX, MBD4, MEN1, MET, MLH1, MSH2, MSH3, MSH6, MUTYH, NF1, NF2, NTHL1, PALB2, PHOX2B, PMS2, POT1, PRKAR1A,  PTCH1, PTEN, RAD51C, RAD51D, RB1, RET, RPS20, RUNX1, SDHA, SDHAF2, SDHB, SDHC, SDHD, SMAD4, SMARCA4, SMARCB1, SMARCE1, STK11, SUFU, TMEM127, TP53, TSC1, TSC2, VHL, and WT1 (sequencing and deletion/duplication); EGFR, HOXB13, KIT, MITF, PDGFRA, POLD1, and POLE (sequencing only); EPCAM and GREM1 (deletion/duplication only).    08/29/2024 - 08/29/2024 Chemotherapy   Patient is on Treatment Plan : ANUS Mitomycin  D1,29 + Capecitabine  + XRT     Invasive carcinoma of breast (HCC)  06/12/2024 Imaging   CT chest abdomen pelvis with contrast showed Slight thickening in the area of the anal canal. Please correlate for history of neoplasm. There is a pathologic enlarged left inguinal node. There is a borderline right-sided node.   No additional areas of abnormal nodal enlargement or other aggressive appearing mass lesion at this time.   Fatty liver infiltration with known segment 7 hepatic hemangioma.   Of note there is a right-sided upper breast mass which was not clearly seen on prior CT scan. Please correlate for any prior study or if needed diagnostic mammographic evaluation and possible ultrasound when appropriate.   06/17/2024 Mammogram   1. There is a suspicious 17 mm mass at the site of CT and mammographic concern in the RIGHT breast. It demonstrates associated pleomorphic calcifications. Recommend ultrasound-guided biopsy for definitive characterization. 2. There is incidental sonographic note of a 7 mm mass in the RIGHT breast at 12 o'clock and a 6 mm non mass area at 9 o'clock. Recommend ultrasound-guided biopsy of these 2 areas for definitive characterization given suspicious appearance of the dominant mass. 3. No suspicious RIGHT axillary adenopathy. 4. No mammographic evidence of malignancy in the LEFT breast.   06/18/2024 Cancer Staging   Staging form: Breast, AJCC 8th Edition - Clinical stage from 06/18/2024: Stage IB (cT1b, cN0, cM0, G3, ER-, PR-, HER2-) - Signed by Babara Call, MD on  06/26/2024 Stage prefix: Initial diagnosis Histologic grading system: 3 grade system   06/18/2024 Initial Diagnosis   Invasive carcinoma of breast [facility])  Patient with right breast biopsy.  Pathology showed 1. Breast, right, needle core biopsy, 12:00 12cmfn (heart clip) :      INVASIVE DUCTAL CARCINOMA      TUBULE FORMATION: SCORE 3      NUCLEAR PLEOMORPHISM: SCORE 3      MITOTIC COUNT: SCORE 2      TOTAL SCORE: 8      OVERALL GRADE: 3      LYMPHOVASCULAR INVASION: NOT IDENTIFIED      CANCER LENGTH: 1.0 CM      CALCIFICATIONS: NOT IDENTIFIED      OTHER FINDINGS: NONE      ER negative, PR negative, HER2 negative (IHC 0) Ki-67 95%.       2. Breast, right, needle core biopsy, 12:00 8cmfn (coil clip) :      INVASIVE DUCTAL CARCINOMA      DUCTAL CARCINOMA IN SITU, SOLID, HIGH NUCLEAR GRADE WITH NECROSIS      TUBULE FORMATION: SCORE 3      NUCLEAR PLEOMORPHISM: SCORE 3      MITOTIC COUNT: SCORE 2      TOTAL SCORE: 8      OVERALL GRADE: 3      LYMPHOVASCULAR INVASION: NOT IDENTIFIED      CANCER LENGTH: 0.3 CM      CALCIFICATIONS: NOT  IDENTIFIED      OTHER FINDINGS: NONE            3. Breast, right, needle core biopsy, 9:00 12cmfn (venus) :      BENIGN BREAST TISSUE WITH DENSE STROMAL FIBROSIS.      NEGATIVE FOR ATYPIA OR MALIGNANCY.    07/08/2024 Surgery   Patient went right breast lumpectomy and sentinel lymph node biopsy.  1. 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      - DUCTAL CARCINOMA IN SITU (DCIS).      - SEE CANCER SUMMARY AND NOTE BELOW.      - TWO BIOPSY SITES WITH CORRESPONDING HEART AND COIL CLIPS.      - TWO SAVI SCOUT TAGS.       2. Lymph node, sentinel, biopsy, Right :      - MICROMETASTATIC CARCINOMA INVOLVES ONE OF FIVE LYMPH NODES (1MI/5).      - SEE CANCER SUMMARY AND NOTE BELOW.       3. Breast, excision, Right medial posterior margin :      - BENIGN BREAST TISSUE.      - NEGATIVE FOR  ATYPIA AND MALIGNANCY.      - SEE CANCER SUMMARY BELOW.   TUMOR Histologic Type: Invasive carcinoma of no special type (ductal) Histologic Grade (Nottingham Histologic Score) Glandular (Acinar)/Tubular Differentiation: 3 Nuclear Pleomorphism: 3 Mitotic Rate: 3 Overall Grade: 3 Tumor Size: Greatest dimension of largest invasive focus: 19 mm Ductal Carcinoma In Situ (DCIS): Present, high-grade with comedonecrosis Lymphatic and/or Vascular Invasion: Present, extensive (LVI in 2 or more blocks)  Treatment Effect in the Breast: No known presurgical therapy MARGINS Margin Status for Invasive Carcinoma: Invasive Carcinoma involves inferior (unifocal) and superior (unifocal) margins and is 0.5 mm to anterior margin Margin Status for DCIS: All margins negative for DCIS Distance from DCIS to closest margin: 0.5 mm Specify closest margin: Inferior REGIONAL LYMPH NODES Regional Lymph Node Status: Tumor present in regional lymph node(s) Number of Lymph Nodes with Macrometastases (greater than 2 mm): 0 Number of Lymph Nodes with Micrometastases (greater than 0.2 mm to 2 mm and/or greater than 200 cells): 1 Number of Lymph Nodes with Isolated Tumor Cells (0.2 mm or less OR 200 cells or less): 0 Size of Largest Metastatic Deposit: 1.9 mm Extranodal Extension: Not identified Total Number of Lymph Nodes Examined (sentinel and non-sentinel): 5 Number of Sentinel Nodes Examined: 5 DISTANT METASTASIS Distant Site(s) Involved, if applicable: Not applicable PATHOLOGIC STAGE CLASSIFICATION (pTNM, AJCC 8th Edition): Modified Classification: Not applicable pT Category: pT1c T Suffix: (m) multiple primary synchronous tumors in a single organ pN Category: pN76mi N Suffix: (sn) pM Category: Not applicable SPECIAL STUDIES Breast Biomarker Testing Performed on Previous Biopsy: [id number] (heart) Estrogen Receptor: Negative (0%) Progesterone Receptor: Negative (0%) HER2 IHC: Negative (0) Proliferation  Marker Ki67: 95%   07/22/2024 Cancer Staging   Staging form: Breast, AJCC 8th Edition - Pathologic stage from 07/22/2024: Stage IB (pT1c, pN21mi(sn), cM0, G3, ER-, PR-, HER2-) - Signed by Babara Call, MD on 07/22/2024 Stage prefix: Initial diagnosis Method of lymph node assessment: Sentinel lymph node biopsy Multigene prognostic tests performed: None Histologic grading system: 3 grade system   07/25/2024 Surgery   Patient reports additional excision for positive margin.  1. Breast, excision, superior margin of right breast - RESIDUAL INVASIVE DUCTAL CARCINOMA, COMPLETELY EXCISED. - CARCINOMA PRESENT 2 MM FROM THE POSTERIOR MARGIN AND 4 MM FROM THE  ANTERIOR AND SUPERIOR MARGINS. 2. Breast, excision, inferior margin of right breast - RESIDUAL INVASIVE DUCTAL CARCINOMA, COMPLETELY EXCISED. - CARCINOMA PRESENT 1 MM FROM THE NEW INFERIOR MARGIN.   08/23/2024 Genetic Testing   Negative genetic testing. No pathogenic variants identified on the [facility] CancerNext-Expanded+RNA Panel. The report date is 08/23/2024.  The CancerNext-Expanded gene panel offered by [facility] and includes sequencing, rearrangement, and RNA analysis for the following 77 genes: AIP, ALK, APC, ATM, AXIN2, BAP1, BARD1, BMPR1A, BRCA1, BRCA2, BRIP1, CDC73, CDH1, CDK4, CDKN1B, CDKN2A, CEBPA, CHEK2, CTNNA1, DDX41, DICER1, ETV6, FH, FLCN, GATA2, LZTR1, MAX, MBD4, MEN1, MET, MLH1, MSH2, MSH3, MSH6, MUTYH, NF1, NF2, NTHL1, PALB2, PHOX2B, PMS2, POT1, PRKAR1A, PTCH1, PTEN, RAD51C, RAD51D, RB1, RET, RPS20, RUNX1, SDHA, SDHAF2, SDHB, SDHC, SDHD, SMAD4, SMARCA4, SMARCB1, SMARCE1, STK11, SUFU, TMEM127, TP53, TSC1, TSC2, VHL, and WT1 (sequencing and deletion/duplication); EGFR, HOXB13, KIT, MITF, PDGFRA, POLD1, and POLE (sequencing only); EPCAM and GREM1 (deletion/duplication only).    10/23/2024 -  Chemotherapy   Patient is on Treatment Plan : BREAST DOSE DENSE AC q14d / PACLitaxel q7d       History of Present Illness: The patient's records  from the referring physician were obtained and reviewed and the patient interviewed to confirm this HPI.  Myeisha Kruser Stennett presents today to review neuropathy symptoms, hand tremors.  She describes several years history of painful burning, tingling affecting bottoms of her feet.  These symptoms predate her cancer diagnosis and treatment.  They have worsened in recent months following administration of chemotherapy for breast cancer.  No involvement of the lower legs, though some numbness in the hands is reported.  Gabapentin  may or may not be helping, she has been on this for years.  Also doses cymbalta  for depression.  Tremors are described as hands tremble when I reach out for objects.  Onset was also several years ago.  There is no tremulousness at rest, only when using the hands.  The tremors interfere with her ability to function, including tying shoes, signing checks, opening cans.  No obvious exacerbating features, although this has also worsened overall since chemo was started.  Medications: Medications Ordered Prior to Encounter[1]  Allergies: Allergies[2] Past Medical History:  Past Medical History:  Diagnosis Date   Anemia    Anginal pain    Anxiety    Arthritis    Asthma    Cancer (HCC)    rectal cancer, breast cancer 2025   Chronic pain syndrome 05/21/2023   DDD (degenerative disc disease), cervical 05/22/2023   Deep venous thrombosis (HCC)    Depression    Dyspnea    Fibromyalgia    GERD (gastroesophageal reflux disease)    Headache    Heart murmur    History of hiatal hernia    History of kidney stones    Lupus    Malignant neoplasm of upper-outer quadrant of right female breast, unspecified estrogen receptor status (HCC) 2025   Pneumonia    PTSD (post-traumatic stress disorder)    Skin candidiasis 05/29/2024   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   Past Surgical History:  Past Surgical History:  Procedure Laterality Date   BREAST BIOPSY Right  06/18/2024   US  RT BREAST BX W LOC DEV 1ST LESION IMG BX SPEC US  GUIDE 06/18/2024 [facility]   BREAST BIOPSY Right 06/18/2024   US  RT BREAST BX W LOC DEV EA ADD LESION IMG BX SPEC US  GUIDE 06/18/2024 [facility]   BREAST BIOPSY Right 06/18/2024   US  RT BREAST BX  W LOC DEV EA ADD LESION IMG BX SPEC US  GUIDE 06/18/2024 [facility]   BREAST BIOPSY Right 07/05/2024   US  RT BREAST SAVI/RF TAG 1ST LESION US  GUIDE 07/05/2024 [facility]   BREAST BIOPSY Right 07/05/2024   US  RT BREAST SAVI/RF TAG EA ADD'L LESION US  GUIDE 07/05/2024 [facility]   BREAST LUMPECTOMY Right 07/25/2024   Procedure: BREAST LUMPECTOMY;  Surgeon: Tye Millet, DO;  Location: [facility];  Service: General;  Laterality: Right;   BREAST LUMPECTOMY WITH RADIO FREQUENCY LOCALIZER Right 07/08/2024   Procedure: BREAST LUMPECTOMY WITH RADIO FREQUENCY LOCALIZER;  Surgeon: Tye Millet, DO;  Location: [facility];  Service: General;  Laterality: Right;   COLONOSCOPY WITH PROPOFOL  N/A 02/17/2023   Procedure: COLONOSCOPY WITH PROPOFOL ;  Surgeon: Maryruth Ole DASEN, MD;  Location: [facility];  Service: Endoscopy;  Laterality: N/A;   ESOPHAGOGASTRODUODENOSCOPY (EGD) WITH PROPOFOL  N/A 02/17/2023   Procedure: ESOPHAGOGASTRODUODENOSCOPY (EGD) WITH PROPOFOL ;  Surgeon: Maryruth Ole DASEN, MD;  Location: [facility];  Service: Endoscopy;  Laterality: N/A;   EXTRACORPOREAL SHOCK WAVE LITHOTRIPSY     IR CV LINE INJECTION  08/21/2024   IR REMOVAL TUN ACCESS W/ PORT W/O FL MOD SED  08/28/2024   PICC LINE INSERTION N/A 08/2024   PORTA CATH INSERTION N/A 10/14/2024   Procedure: PORTA CATH INSERTION;  Surgeon: Marea Selinda RAMAN, MD;  Location: [facility];  Service: Cardiovascular;  Laterality: N/A;   PORTA CATH REMOVAL N/A 10/14/2024   Procedure: PORTA CATH REMOVAL;  Surgeon: Marea Selinda RAMAN, MD;  Location: [facility];  Service: Cardiovascular;  Laterality: N/A;   PORTACATH PLACEMENT Right 07/08/2024   Procedure:  INSERTION, TUNNELED CENTRAL VENOUS DEVICE, WITH PORT;  Surgeon: Tye Millet, DO;  Location: [facility];  Service: General;  Laterality: Right;   RECTAL EXAM UNDER ANESTHESIA N/A 05/16/2024   Procedure: EXAM UNDER ANESTHESIA, RECTUM, INCISIONAL BIOPSY OF ANAL MASS;  Surgeon: Tye Millet, DO;  Location: [facility];  Service: General;  Laterality: N/A;   SENTINEL NODE BIOPSY Right 07/08/2024   Procedure: BIOPSY, LYMPH NODE, SENTINEL;  Surgeon: Tye Millet, DO;  Location: [facility];  Service: General;  Laterality: Right;   TONSILLECTOMY     TUBAL LIGATION     VULVECTOMY N/A 07/29/2020   Procedure: WIDE EXCISION VULVECTOMY;  Surgeon: Mancil Barter, MD;  Location: [facility];  Service: Gynecology;  Laterality: N/A;   Social History:  Social History   Socioeconomic History   Marital status: Divorced    Spouse name: Not on file   Number of children: 1   Years of education: Not on file   Highest education level: Not on file  Occupational History   Not on file  Tobacco Use   Smoking status: Former    Types: Cigarettes   Smokeless tobacco: Never  Vaping Use   Vaping status: Never Used  Substance and Sexual Activity   Alcohol use: Not Currently   Drug use: No   Sexual activity: Yes  Other Topics Concern   Not on file  Social History Narrative   Not on file   Social Drivers of Health   Tobacco Use: Medium Risk (12/13/2024)   Patient History    Smoking Tobacco Use: Former    Smokeless Tobacco Use: Never    Passive Exposure: Not on file  Financial Resource Strain: Low Risk  (11/29/2024)   Received from [facility]   Overall Financial Resource Strain (CARDIA)    Difficulty of Paying Living Expenses: Not hard at all  Food  Insecurity: No Food Insecurity (12/13/2024)   Epic    Worried About Programme Researcher, Broadcasting/film/video in the Last Year: Never true    Ran Out of Food in the Last Year: Never true  Transportation Needs: No Transportation Needs (12/13/2024)   Epic    Lack of  Transportation (Medical): No    Lack of Transportation (Non-Medical): No  Physical Activity: Not on file  Stress: Not on file  Social Connections: Not on file  Intimate Partner Violence: Not At Risk (12/13/2024)   Epic    Fear of Current or Ex-Partner: No    Emotionally Abused: No    Physically Abused: No    Sexually Abused: No  Depression (PHQ2-9): Low Risk (12/04/2024)   Depression (PHQ2-9)    PHQ-2 Score: 0  Alcohol Screen: Low Risk (10/16/2024)   Alcohol Screen    Last Alcohol Screening Score (AUDIT): 0  Housing: Low Risk (12/13/2024)   Epic    Unable to Pay for Housing in the Last Year: No    Number of Times Moved in the Last Year: 0    Homeless in the Last Year: No  Utilities: Not At Risk (12/13/2024)   Epic    Threatened with loss of utilities: No  Health Literacy: Not on file   Family History:  Family History  Problem Relation Age of Onset   Diabetes Mother    Congestive Heart Failure Mother    Colon cancer Father 12   Lupus Maternal Grandmother    Cancer Maternal Grandfather        either prostate or colon   Colon cancer Paternal Grandfather    Lung cancer Paternal Grandfather     Review of Systems: Constitutional: Doesn't report fevers, chills or abnormal weight loss Eyes: Doesn't report blurriness of vision Ears, nose, mouth, throat, and face: Doesn't report sore throat Respiratory: Doesn't report cough, dyspnea or wheezes Cardiovascular: Doesn't report palpitation, chest discomfort  Gastrointestinal:  Doesn't report nausea, constipation, diarrhea GU: Doesn't report incontinence Skin: Doesn't report skin rashes Neurological: Per HPI Musculoskeletal: Doesn't report joint pain Behavioral/Psych: Doesn't report anxiety  Physical Exam: Vitals:   12/13/24 1037  BP: 102/70  Pulse: 97  Resp: 20  Temp: 97.6 F (36.4 C)  SpO2: 100%   KPS: 80. General: Alert, cooperative, pleasant, in no acute distress Head: Normal EENT: No conjunctival injection or  scleral icterus.  Lungs: Resp effort normal Cardiac: Regular rate Abdomen: Non-distended abdomen Skin: No rashes cyanosis or petechiae. Extremities: No clubbing or edema  Neurologic Exam: Mental Status: Awake, alert, attentive to examiner. Oriented to self and environment. Language is fluent with intact comprehension.  Cranial Nerves: Visual acuity is grossly normal. Visual fields are full. Extra-ocular movements intact. No ptosis. Face is symmetric Motor: Tone and bulk are normal.  Noted high frequency symmetric tremor in hands with posturing, absent at rest. Power is full in both arms and legs. Reflexes are symmetric, no pathologic reflexes present.  Sensory: Stocking impairment Gait: Normal.   Labs: I have reviewed the data as listed    Component Value Date/Time   NA 139 12/11/2024 1306   NA 141 03/06/2015 1307   K 3.8 12/11/2024 1306   K 3.2 (L) 03/06/2015 1307   CL 108 12/11/2024 1306   CL 107 03/06/2015 1307   CO2 19 (L) 12/11/2024 1306   CO2 25 03/06/2015 1307   GLUCOSE 151 (H) 12/11/2024 1306   GLUCOSE 83 03/06/2015 1307   BUN 8 12/11/2024 1306   BUN 5 (L)  03/06/2015 1307   CREATININE 0.83 12/11/2024 1306   CREATININE 0.89 03/06/2015 1307   CALCIUM 9.3 12/11/2024 1306   CALCIUM 8.8 (L) 03/06/2015 1307   PROT 6.4 (L) 12/11/2024 1306   PROT 7.4 03/06/2015 1307   ALBUMIN  3.8 12/11/2024 1306   ALBUMIN  4.2 03/06/2015 1307   AST 16 12/11/2024 1306   ALT 13 12/11/2024 1306   ALT 14 03/06/2015 1307   ALKPHOS 61 12/11/2024 1306   ALKPHOS 46 03/06/2015 1307   BILITOT <0.2 12/11/2024 1306   GFRNONAA >60 12/11/2024 1306   GFRNONAA >60 03/06/2015 1307   GFRAA >60 07/27/2020 1253   GFRAA >60 03/06/2015 1307   Lab Results  Component Value Date   WBC 6.3 12/11/2024   NEUTROABS 4.5 12/11/2024   HGB 9.2 (L) 12/11/2024   HCT 29.3 (L) 12/11/2024   MCV 104.6 (H) 12/11/2024   PLT 416 (H) 12/11/2024     Assessment/Plan Chemotherapy-induced neuropathy  Tremor  Batya  M Coombes presents with clinical syndrome consistent with symmetric, length dependent, small and large fiber peripheral neuropathy.  Etiology is likely multifactorial, history of lupus, alcohol abuse, now exposure to chemotherapy.  We reviewed pathophysiology of chemotherapy induced neuropathy, available treatments, and goals of care.  She is already dosing 1800mg  daily gabapentin  with mixed efficacy.  Recommended trial of increased dose cymbalta  to 60mg  daily given good tolerance.  If ineffective can consider switch to Lyrica .    For tremors, these may be essential or physiologic given features of history and exam.  They have worsened recently since chemotherapy exposure.  She is agreeable with trial of propanolol 20mg  BID.  We spent twenty additional minutes teaching regarding the natural history, biology, and historical experience in the treatment of neurologic complications of cancer.   We appreciate the opportunity to participate in the care of Minami M Hinz.   We ask that Kristl Morioka Nordell return to clinic in 2 months for re-evaluation and med titration, or sooner as needed.  All questions were answered. The patient knows to call the clinic with any problems, questions or concerns. No barriers to learning were detected.  The total time spent in the encounter was 40 minutes and more than 50% was on counseling and review of test results   Arthea MARLA Manns, MD Medical Director of Neuro-Oncology [facility] [place] [place] 12/13/24 11:10 AM    [1]  Current Outpatient Medications on File Prior to Visit  Medication Sig Dispense Refill   acetaminophen  (TYLENOL ) 500 MG tablet Take 500 mg by mouth every 6 (six) hours as needed.     albuterol  (VENTOLIN  HFA) 108 (90 Base) MCG/ACT inhaler Inhale 2 puffs into the lungs every 4 (four) hours as needed for wheezing or shortness of breath. 8.5 g 2   alum & mag hydroxide-simeth-nystatin -diphenhydrAMINE  Take 5 mLs by mouth 3 (three) times  daily for 15 days. Suspension contains equal amounts of Maalox Extra Strength, nystatin , and diphenhydramine . 225 mL 0   ascorbic acid (VITAMIN C) 500 MG tablet Take 500 mg by mouth daily.     azelastine  (ASTELIN ) 0.1 % nasal spray Place 1 spray into both nostrils 2 (two) times daily. Use in each nostril as directed 30 mL 0   benzonatate  (TESSALON ) 200 MG capsule Take 1 capsule (200 mg total) by mouth 3 (three) times daily as needed for cough. 30 capsule 1   BIOTIN PO Take by mouth daily.     budesonide  (PULMICORT ) 0.25 MG/2ML nebulizer solution Take 2 mLs (0.25  mg total) by nebulization 2 (two) times daily. 60 mL 12   busPIRone  (BUSPAR ) 5 MG tablet Take 5 mg by mouth 2 (two) times daily.     chlorhexidine  (PERIDEX ) 0.12 % solution Use as directed 10 mLs in the mouth or throat 2 (two) times daily. 473 mL 0   cholecalciferol (VITAMIN D3) 25 MCG (1000 UNIT) tablet Take 1,000 Units by mouth daily.     clonazePAM  (KLONOPIN ) 0.5 MG tablet Take 0.5 mg by mouth at bedtime as needed for anxiety.     dexamethasone  (DECADRON ) 4 MG tablet Take 2 tablets (8 mg total) by mouth daily for 2 days. Start the day after doxorubicin /cyclophosphamide  chemotherapy. Take with food. 30 tablet 1   diphenoxylate -atropine  (LOMOTIL ) 2.5-0.025 MG tablet Take 1 tablet by mouth 4 (four) times daily. 90 tablet 0   enoxaparin  (LOVENOX ) 80 MG/0.8ML injection Inject 0.8 mLs (80 mg total) into the skin every 12 (twelve) hours. 48 mL 2   esomeprazole  (NEXIUM ) 40 MG capsule Take 1 capsule (40 mg total) by mouth daily. 90 capsule 1   fluconazole  (DIFLUCAN ) 100 MG tablet Take 1 tablet (100 mg total) by mouth daily for 14 days. 14 tablet 0   gabapentin  (NEURONTIN ) 300 MG capsule Take 2 capsules (600 mg total) by mouth 3 (three) times daily. 180 capsule 2   ibuprofen  (ADVIL ) 200 MG tablet Take 200 mg by mouth every 6 (six) hours as needed.     ipratropium-albuterol  (DUONEB) 0.5-2.5 (3) MG/3ML SOLN Take 3 mLs by nebulization 4 (four) times  daily. 360 mL 3   lactose free nutrition (BOOST PLUS) LIQD Take 237 mLs by mouth 2 (two) times daily between meals. 237 mL 11   lidocaine -prilocaine  (EMLA ) cream Apply 1 Application topically as needed. 30 g 3   linaclotide (LINZESS) 145 MCG CAPS capsule Take 145 mcg by mouth daily before breakfast.     LORazepam  (ATIVAN ) 0.5 MG tablet Take 1 tablet (0.5 mg total) by mouth every 12 (twelve) hours as needed for anxiety (nausea). 30 tablet 0   megestrol  (MEGACE ) 40 MG tablet Take 2 tablets (80 mg total) by mouth 2 (two) times daily. 120 tablet 0   montelukast  (SINGULAIR ) 10 MG tablet Take 1 tablet (10 mg total) by mouth See admin instructions. Take 1 tablet on the day prior to chemotherapy and take 1 tablet daily for 2 days after chemotherapy. 30 tablet 0   Multiple Vitamin (MULTIVITAMIN WITH MINERALS) TABS tablet Take 1 tablet by mouth daily.     nystatin  (MYCOSTATIN ) 100000 UNIT/ML suspension Take 5 mLs (500,000 Units total) by mouth 4 (four) times daily. Swish and swallow 473 mL 1   oxyCODONE  (OXY IR/ROXICODONE ) 5 MG immediate release tablet Take 1 tablet (5 mg total) by mouth every 6 (six) hours as needed for severe pain (pain score 7-10) or breakthrough pain. 15 tablet 0   potassium chloride  SA (KLOR-CON  M) 20 MEQ tablet Take 1 tablet (20 mEq total) by mouth 2 (two) times daily. 60 tablet 0   prochlorperazine  (COMPAZINE ) 10 MG tablet Take 1 tablet (10 mg total) by mouth every 6 (six) hours as needed for nausea or vomiting. 30 tablet 1   promethazine  (PHENERGAN ) 25 MG tablet Take 1 tablet (25 mg total) by mouth every 8 (eight) hours as needed for nausea or vomiting. 60 tablet 0   traZODone  (DESYREL ) 100 MG tablet Take 100 mg by mouth at bedtime.     naloxone  (NARCAN ) nasal spray 4 mg/0.1 mL SPRAY 1 SPRAY INTO ONE  NOSTRIL AS DIRECTED FOR OPIOID OVERDOSE (TURN PERSON ON SIDE AFTER DOSE. IF NO RESPONSE IN 2-3 MINUTES OR PERSON RESPONDS BUT RELAPSES, REPEAT USING A NEW SPRAY DEVICE AND SPRAY INTO THE  OTHER NOSTRIL. CALL 911 AFTER USE.) * EMERGENCY USE ONLY * (Patient not taking: Reported on 12/13/2024) 1 each 0   No current facility-administered medications on file prior to visit.  [2]  Allergies Allergen Reactions   Prednisone Shortness Of Breath and Swelling    Patient has taken methylprednisolone  (Depo-Medrol ) and dexamethasone  (Decadron ) without any documented side effects or adverse reactions. She describes that she used to take prednisone due to her bronchial asthma and respiratory problems, but later on she received some prednisone which into locations caused her to have significant swelling and shortness of breath. For that reason, she was told to avoid the use of prednisone.   Silicone Hives and Itching   "

## 2024-12-13 NOTE — Telephone Encounter (Signed)
 Copy of the [facility] forms placed at front desk for patient's copy of forms. Pt aware.

## 2024-12-15 ENCOUNTER — Encounter: Payer: Self-pay | Admitting: Oncology

## 2024-12-18 ENCOUNTER — Inpatient Hospital Stay: Attending: Oncology

## 2024-12-18 ENCOUNTER — Inpatient Hospital Stay

## 2024-12-18 ENCOUNTER — Inpatient Hospital Stay: Attending: Oncology | Admitting: Nurse Practitioner

## 2024-12-18 ENCOUNTER — Encounter: Payer: Self-pay | Admitting: Nurse Practitioner

## 2024-12-18 VITALS — BP 104/75 | HR 95 | Temp 99.4°F | Resp 16 | Wt 186.0 lb

## 2024-12-18 DIAGNOSIS — C50919 Malignant neoplasm of unspecified site of unspecified female breast: Secondary | ICD-10-CM

## 2024-12-18 DIAGNOSIS — D701 Agranulocytosis secondary to cancer chemotherapy: Secondary | ICD-10-CM

## 2024-12-18 DIAGNOSIS — D6481 Anemia due to antineoplastic chemotherapy: Secondary | ICD-10-CM

## 2024-12-18 DIAGNOSIS — R110 Nausea: Secondary | ICD-10-CM

## 2024-12-18 DIAGNOSIS — R630 Anorexia: Secondary | ICD-10-CM | POA: Diagnosis not present

## 2024-12-18 DIAGNOSIS — T451X5A Adverse effect of antineoplastic and immunosuppressive drugs, initial encounter: Secondary | ICD-10-CM

## 2024-12-18 DIAGNOSIS — C210 Malignant neoplasm of anus, unspecified: Secondary | ICD-10-CM

## 2024-12-18 LAB — CBC WITH DIFFERENTIAL (CANCER CENTER ONLY)
Abs Immature Granulocytes: 0.06 10*3/uL (ref 0.00–0.07)
Basophils Absolute: 0 10*3/uL (ref 0.0–0.1)
Basophils Relative: 2 %
Eosinophils Absolute: 0 10*3/uL (ref 0.0–0.5)
Eosinophils Relative: 1 %
HCT: 27.5 % — ABNORMAL LOW (ref 36.0–46.0)
Hemoglobin: 8.8 g/dL — ABNORMAL LOW (ref 12.0–15.0)
Immature Granulocytes: 3 %
Lymphocytes Relative: 12 %
Lymphs Abs: 0.2 10*3/uL — ABNORMAL LOW (ref 0.7–4.0)
MCH: 32.8 pg (ref 26.0–34.0)
MCHC: 32 g/dL (ref 30.0–36.0)
MCV: 102.6 fL — ABNORMAL HIGH (ref 80.0–100.0)
Monocytes Absolute: 0.1 10*3/uL (ref 0.1–1.0)
Monocytes Relative: 5 %
Neutro Abs: 1.4 10*3/uL — ABNORMAL LOW (ref 1.7–7.7)
Neutrophils Relative %: 77 %
Platelet Count: 142 10*3/uL — ABNORMAL LOW (ref 150–400)
RBC: 2.68 MIL/uL — ABNORMAL LOW (ref 3.87–5.11)
RDW: 15.8 % — ABNORMAL HIGH (ref 11.5–15.5)
Smear Review: NORMAL
WBC Count: 1.8 10*3/uL — ABNORMAL LOW (ref 4.0–10.5)
nRBC: 0 % (ref 0.0–0.2)

## 2024-12-18 LAB — CMP (CANCER CENTER ONLY)
ALT: 23 U/L (ref 0–44)
AST: 21 U/L (ref 15–41)
Albumin: 3.7 g/dL (ref 3.5–5.0)
Alkaline Phosphatase: 59 U/L (ref 38–126)
Anion gap: 13 (ref 5–15)
BUN: 12 mg/dL (ref 6–20)
CO2: 19 mmol/L — ABNORMAL LOW (ref 22–32)
Calcium: 9.1 mg/dL (ref 8.9–10.3)
Chloride: 106 mmol/L (ref 98–111)
Creatinine: 0.77 mg/dL (ref 0.44–1.00)
GFR, Estimated: >60 mL/min
Glucose, Bld: 154 mg/dL — ABNORMAL HIGH (ref 70–99)
Potassium: 3.9 mmol/L (ref 3.5–5.1)
Sodium: 139 mmol/L (ref 135–145)
Total Bilirubin: 0.2 mg/dL (ref 0.0–1.2)
Total Protein: 6 g/dL — ABNORMAL LOW (ref 6.5–8.1)

## 2024-12-18 MED ORDER — ONDANSETRON HCL 4 MG/2ML IJ SOLN
4.0000 mg | Freq: Once | INTRAMUSCULAR | Status: AC
  Administered 2024-12-18: 4 mg via INTRAVENOUS
  Filled 2024-12-18: qty 2

## 2024-12-18 MED ORDER — SODIUM CHLORIDE 0.9 % IV SOLN
Freq: Once | INTRAVENOUS | Status: AC
  Filled 2024-12-18: qty 250

## 2024-12-18 MED ORDER — TRAMADOL HCL 50 MG PO TABS: 25.0000 mg | ORAL_TABLET | Freq: Four times a day (QID) | ORAL | 0 refills | Status: AC | PRN

## 2024-12-18 NOTE — Progress Notes (Signed)
 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 Summary of oncologic history is as follows: Oncology History  Anal squamous cell carcinoma (HCC)  05/29/2024 Initial Diagnosis   Anal squamous cell carcinoma   Patient has noticed a hard mass at the anal area which she initially thought was a hemorrhoid.  The mass has been present for more than a month and appears to be enlarging. Patient was referred to establish care with surgeon Dr. Tye  05/16/2024, incisional biopsy of anal mass showed 1. Anus, biopsy, mass :       - INVASIVE MODERATELY DIFFERENTIATED SQUAMOUS CELL CARCINOMA  06/19/2024, left inguinal adenopathy biopsy showed Metastatic moderately differentiated squamous cell carcinoma.  '    05/29/2024 Cancer Staging   Staging form: Anus, AJCC V9 - Clinical stage from 05/29/2024: Stage IIIA (cT3, cN1a, cM0) - Signed by Babara Call, MD on 06/26/2024 Stage prefix: Initial diagnosis   06/05/2024 Imaging   CT chest abdomen pelvis with contrast  Slight thickening in the area of the anal canal. Please correlate for history of neoplasm. There is a pathologic enlarged left inguinal node. There is a borderline right-sided node.   No additional areas of abnormal nodal enlargement or other aggressive appearing mass lesion at this time.   Fatty liver infiltration with known segment 7 hepatic hemangioma.   Of note there is a right-sided upper breast mass which was not clearly seen on prior CT scan. Please correlate for any prior study or if  needed diagnostic mammographic evaluation and possible ultrasound when appropriate.     06/12/2024 Imaging   CT chest abdomen pelvis with contrast showed Slight thickening in the area of the anal canal. Please correlate for history of neoplasm. There is a pathologic enlarged left inguinal node. There is a borderline right-sided node.   No additional areas of abnormal nodal enlargement or other aggressive appearing mass lesion at this time.   Fatty liver infiltration with known segment 7 hepatic hemangioma.   Of note there is a right-sided upper breast mass which was not clearly seen on prior CT scan. Please correlate for any prior study or if needed diagnostic mammographic evaluation and possible ultrasound when appropriate.   06/19/2024 Procedure   1. Lymph node, biopsy, left inguinal adenopathy :       -  METASTATIC MODERATELY DIFFERENTIATED SQUAMOUS CELL CARCINOMA    07/01/2024 Imaging   PET scan showed 1. Hypermetabolic anal mass with hypermetabolic left inguinal adenopathy, compatible with anorectal carcinoma. 2. Hypermetabolic right breast nodule with recent biopsies on 06/18/2024. Please correlate with pathology report.   07/31/2024 - 07/31/2024 Chemotherapy   Patient is on Treatment Plan : ANUS Mitomycin  D1,28 + 5FU D1-4, 28-31 q32d     08/23/2024 Genetic Testing   Negative genetic testing. No pathogenic variants identified on the [facility] CancerNext-Expanded+RNA Panel. The report date is 08/23/2024.  The CancerNext-Expanded gene panel offered by [facility] and includes sequencing, rearrangement, and RNA analysis for the following 77 genes: AIP, ALK, APC, ATM, AXIN2, BAP1, BARD1, BMPR1A, BRCA1, BRCA2, BRIP1, CDC73, CDH1, CDK4, CDKN1B, CDKN2A, CEBPA, CHEK2, CTNNA1, DDX41, DICER1,  ETV6, FH, FLCN, GATA2, LZTR1, MAX, MBD4, MEN1, MET, MLH1, MSH2, MSH3, MSH6, MUTYH, NF1, NF2, NTHL1, PALB2, PHOX2B, PMS2, POT1, PRKAR1A, PTCH1, PTEN, RAD51C, RAD51D, RB1, RET, RPS20, RUNX1, SDHA,  SDHAF2, SDHB, SDHC, SDHD, SMAD4, SMARCA4, SMARCB1, SMARCE1, STK11, SUFU, TMEM127, TP53, TSC1, TSC2, VHL, and WT1 (sequencing and deletion/duplication); EGFR, HOXB13, KIT, MITF, PDGFRA, POLD1, and POLE (sequencing only); EPCAM and GREM1 (deletion/duplication only).    08/29/2024 - 08/29/2024 Chemotherapy   Patient is on Treatment Plan : ANUS Mitomycin  D1,29 + Capecitabine  + XRT     Invasive carcinoma of breast (HCC)  06/12/2024 Imaging   CT chest abdomen pelvis with contrast showed Slight thickening in the area of the anal canal. Please correlate for history of neoplasm. There is a pathologic enlarged left inguinal node. There is a borderline right-sided node.   No additional areas of abnormal nodal enlargement or other aggressive appearing mass lesion at this time.   Fatty liver infiltration with known segment 7 hepatic hemangioma.   Of note there is a right-sided upper breast mass which was not clearly seen on prior CT scan. Please correlate for any prior study or if needed diagnostic mammographic evaluation and possible ultrasound when appropriate.   06/17/2024 Mammogram   1. There is a suspicious 17 mm mass at the site of CT and mammographic concern in the RIGHT breast. It demonstrates associated pleomorphic calcifications. Recommend ultrasound-guided biopsy for definitive characterization. 2. There is incidental sonographic note of a 7 mm mass in the RIGHT breast at 12 o'clock and a 6 mm non mass area at 9 o'clock. Recommend ultrasound-guided biopsy of these 2 areas for definitive characterization given suspicious appearance of the dominant mass. 3. No suspicious RIGHT axillary adenopathy. 4. No mammographic evidence of malignancy in the LEFT breast.   06/18/2024 Cancer Staging   Staging form: Breast, AJCC 8th Edition - Clinical stage from 06/18/2024: Stage IB (cT1b, cN0, cM0, G3, ER-, PR-, HER2-) - Signed by Babara Call, MD on 06/26/2024 Stage prefix: Initial diagnosis Histologic  grading system: 3 grade system   06/18/2024 Initial Diagnosis   Invasive carcinoma of breast [facility])  Patient with right breast biopsy.  Pathology showed 1. Breast, right, needle core biopsy, 12:00 12cmfn (heart clip) :      INVASIVE DUCTAL CARCINOMA      TUBULE FORMATION: SCORE 3      NUCLEAR PLEOMORPHISM: SCORE 3      MITOTIC COUNT: SCORE 2      TOTAL SCORE: 8      OVERALL GRADE: 3      LYMPHOVASCULAR INVASION: NOT IDENTIFIED      CANCER LENGTH: 1.0 CM      CALCIFICATIONS: NOT IDENTIFIED      OTHER FINDINGS: NONE      ER negative, PR negative, HER2 negative (IHC 0) Ki-67 95%.       2. Breast, right, needle core biopsy, 12:00 8cmfn (coil clip) :      INVASIVE DUCTAL CARCINOMA      DUCTAL CARCINOMA IN SITU, SOLID, HIGH NUCLEAR GRADE WITH NECROSIS      TUBULE FORMATION: SCORE 3      NUCLEAR PLEOMORPHISM: SCORE 3      MITOTIC COUNT: SCORE 2      TOTAL SCORE: 8      OVERALL GRADE: 3      LYMPHOVASCULAR INVASION: NOT IDENTIFIED      CANCER LENGTH: 0.3 CM      CALCIFICATIONS: NOT IDENTIFIED      OTHER FINDINGS: NONE  3. Breast, right, needle core biopsy, 9:00 12cmfn (venus) :      BENIGN BREAST TISSUE WITH DENSE STROMAL FIBROSIS.      NEGATIVE FOR ATYPIA OR MALIGNANCY.    07/08/2024 Surgery   Patient went right breast lumpectomy and sentinel lymph node biopsy.  1. 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      - DUCTAL CARCINOMA IN SITU (DCIS).      - SEE CANCER SUMMARY AND NOTE BELOW.      - TWO BIOPSY SITES WITH CORRESPONDING HEART AND COIL CLIPS.      - TWO SAVI SCOUT TAGS.       2. Lymph node, sentinel, biopsy, Right :      - MICROMETASTATIC CARCINOMA INVOLVES ONE OF FIVE LYMPH NODES (1MI/5).      - SEE CANCER SUMMARY AND NOTE BELOW.       3. Breast, excision, Right medial posterior margin :      - BENIGN BREAST TISSUE.      - NEGATIVE FOR ATYPIA AND MALIGNANCY.      - SEE CANCER SUMMARY BELOW.    TUMOR Histologic Type: Invasive carcinoma of no special type (ductal) Histologic Grade (Nottingham Histologic Score) Glandular (Acinar)/Tubular Differentiation: 3 Nuclear Pleomorphism: 3 Mitotic Rate: 3 Overall Grade: 3 Tumor Size: Greatest dimension of largest invasive focus: 19 mm Ductal Carcinoma In Situ (DCIS): Present, high-grade with comedonecrosis Lymphatic and/or Vascular Invasion: Present, extensive (LVI in 2 or more blocks)  Treatment Effect in the Breast: No known presurgical therapy MARGINS Margin Status for Invasive Carcinoma: Invasive Carcinoma involves inferior (unifocal) and superior (unifocal) margins and is 0.5 mm to anterior margin Margin Status for DCIS: All margins negative for DCIS Distance from DCIS to closest margin: 0.5 mm Specify closest margin: Inferior REGIONAL LYMPH NODES Regional Lymph Node Status: Tumor present in regional lymph node(s) Number of Lymph Nodes with Macrometastases (greater than 2 mm): 0 Number of Lymph Nodes with Micrometastases (greater than 0.2 mm to 2 mm and/or greater than 200 cells): 1 Number of Lymph Nodes with Isolated Tumor Cells (0.2 mm or less OR 200 cells or less): 0 Size of Largest Metastatic Deposit: 1.9 mm Extranodal Extension: Not identified Total Number of Lymph Nodes Examined (sentinel and non-sentinel): 5 Number of Sentinel Nodes Examined: 5 DISTANT METASTASIS Distant Site(s) Involved, if applicable: Not applicable PATHOLOGIC STAGE CLASSIFICATION (pTNM, AJCC 8th Edition): Modified Classification: Not applicable pT Category: pT1c T Suffix: (m) multiple primary synchronous tumors in a single organ pN Category: pN32mi N Suffix: (sn) pM Category: Not applicable SPECIAL STUDIES Breast Biomarker Testing Performed on Previous Biopsy: [id number] (heart) Estrogen Receptor: Negative (0%) Progesterone Receptor: Negative (0%) HER2 IHC: Negative (0) Proliferation Marker Ki67: 95%   07/22/2024 Cancer Staging   Staging  form: Breast, AJCC 8th Edition - Pathologic stage from 07/22/2024: Stage IB (pT1c, pN25mi(sn), cM0, G3, ER-, PR-, HER2-) - Signed by Babara Call, MD on 07/22/2024 Stage prefix: Initial diagnosis Method of lymph node assessment: Sentinel lymph node biopsy Multigene prognostic tests performed: None Histologic grading system: 3 grade system   07/25/2024 Surgery   Patient reports additional excision for positive margin.  1. Breast, excision, superior margin of right breast - RESIDUAL INVASIVE DUCTAL CARCINOMA, COMPLETELY EXCISED. - CARCINOMA PRESENT 2 MM FROM THE POSTERIOR MARGIN AND 4 MM FROM THE ANTERIOR AND SUPERIOR MARGINS. 2. Breast, excision, inferior margin of right breast - RESIDUAL INVASIVE DUCTAL CARCINOMA, COMPLETELY EXCISED. -  CARCINOMA PRESENT 1 MM FROM THE NEW INFERIOR MARGIN.   08/23/2024 Genetic Testing   Negative genetic testing. No pathogenic variants identified on the [facility] CancerNext-Expanded+RNA Panel. The report date is 08/23/2024.  The CancerNext-Expanded gene panel offered by [facility] and includes sequencing, rearrangement, and RNA analysis for the following 77 genes: AIP, ALK, APC, ATM, AXIN2, BAP1, BARD1, BMPR1A, BRCA1, BRCA2, BRIP1, CDC73, CDH1, CDK4, CDKN1B, CDKN2A, CEBPA, CHEK2, CTNNA1, DDX41, DICER1, ETV6, FH, FLCN, GATA2, LZTR1, MAX, MBD4, MEN1, MET, MLH1, MSH2, MSH3, MSH6, MUTYH, NF1, NF2, NTHL1, PALB2, PHOX2B, PMS2, POT1, PRKAR1A, PTCH1, PTEN, RAD51C, RAD51D, RB1, RET, RPS20, RUNX1, SDHA, SDHAF2, SDHB, SDHC, SDHD, SMAD4, SMARCA4, SMARCB1, SMARCE1, STK11, SUFU, TMEM127, TP53, TSC1, TSC2, VHL, and WT1 (sequencing and deletion/duplication); EGFR, HOXB13, KIT, MITF, PDGFRA, POLD1, and POLE (sequencing only); EPCAM and GREM1 (deletion/duplication only).    10/23/2024 -  Chemotherapy   Patient is on Treatment Plan : BREAST DOSE DENSE AC q14d / PACLitaxel q7d      Discussed the use of AI scribe software for clinical note transcription with the patient, who gave verbal  consent to proceed.   Patient has a history of VIN 3 status post vulvectomy  07/11/2024,she was evaluated by cardiology with palpitations and atypical chest pain. Chest pain deemed atypical and she went on to have a normal echo at 07/19/2024.   07/23/2024, patient was seen by symptom management for right neck pain.  She was seen by Tinnie Dawn.  Right upper extremity ultrasound was obtained which showed acute occlusive DVT involving the right internal jugular and subclavian veins.  Patient was started on Lovenox , she was supposed to be started on 1 mg/kg twice daily however was started off 40 mg twice daily which is a subtherapeutic dosage. She was asked to hold off the evening dose prior to her breast re-excision.  07/29/2024, patient presented emergency room due to shortness of breath and chest pain.  CTA angiogram showed subsegmental PE medially in the left lower lobe.  Shortness of breath was felt to be multifactorial secondary to asthma/bronchitis, pulmonary embolism.  Symptoms are better after supportive care and she was discharged on Lovenox  80 mg subcutaneously twice daily.  08/31/2024 -09/06/2024 hospitalized due to sepsis due to multifocal pneumonia, UTI. Respiratory viral panel positive for metapneumovirus  Patient resumed on radiation on 09/16/24.  Patient took Xeloda  twice daily and continued radiation till 09/19/2024.  She was not able to continue radiation on 09/20/2024 due to severe diarrhea.  11/7 radiation was not given due to patient's symptoms Final radiation was done on 09/25/2024. Last dose of Xeloda  was omitted.     INTERVAL HISTORY Monica Gonzalez is a 49 y.o. female with hx anal cancer s/p radiation undergoing chemotherapy now for triple neg breast cancer who presents for oncology follow-up to address chemotherapy-related adverse effects and gastrointestinal symptoms.  She is currently receiving chemotherapy for breast cancer, with the most recent cycle administered on January  28th that included cytoxan  and adriamycin  with GCSF on the 30th. Since this treatment, she has experienced increased fatigue and persistent daily nausea, managed with Phenergan , Compazine , and Zofran  as needed. Phenergan  is reserved for times when sedation is acceptable, while Compazine  is used in the morning to avoid drowsiness. Nausea medications are generally effective, but oral intake remains reduced, with dry lips and eating only once daily. She supplements with Boost and Ensure, alternating intake to minimize diarrhea, and uses lactose-free milk due to lifelong lactose intolerance. Nausea and gastrointestinal upset are exacerbated by the timing of  supplement intake.  She is currently see Joli our Registered Dietician as well.  She reports worsening neuropathy, including hand tremors and weakness requiring the use of both hands to hold objects. She is under neurologic care and is trialing a medication described as a 'little green pill' starting with 'P', which was determined to be propanolol with variable efficacy. Lyrica  has been discussed as a possible alternative if symptoms persist. She also experiences a metallic taste, mitigated by using plastic silverware.  Musculoskeletal symptoms include intermittent pain in the neck, back, knees, legs, and feet, with pain intensity up to 7/10, occasionally limiting ambulation. Neck pain is associated with audible grinding. She has an upcoming neurology appointment for further evaluation. Pain management has included ibuprofen , Tylenol , previously oxycodone  (recently depleted), and a trial of fentanyl  patch with partial relief. She continues to use Claritin for bone pain associated with bone marrow stimulating injections and has completed steroids post-chemotherapy.  I will give her a 30 prescription for low dose tramadol  to help with musculoskeletal pain.  Gastrointestinal symptoms include significant gas, intermittent constipation, and infrequent bowel  movements, with stool described as long and twisted, suggestive of incomplete evacuation. She had a bowel movement this morning and several yesterday. She experiences tenderness above rectum with a history of radiation-induced scarring and rawness in the anal region. She reports feeling two palpable little things in the anal area, similar to her initial presentation. She occasionally has difficulty sensing the need to defecate, resulting in accidents outside the home. Diarrhea is not currently present. Urinary function is intact, with normal urination, though she notes warmth and persistent tenderness in the perineal region attributed to prior radiation.  She does have an upcoming appointment for a rectal exam via GI as well.  Reviewed with patient neutropenic precautions and encouraged her to call with any fever over 100.5, cough congestion, any signs of infection.   She reports upcoming labs and a planned dental evaluation for two teeth, for which she requires medical clearance.  Discussed the use of AI scribe software for clinical note transcription with the patient, who gave verbal consent to proceed.    MEDICAL HISTORY:  Past Medical History:  Diagnosis Date   Anemia    Anginal pain    Anxiety    Arthritis    Asthma    Cancer (HCC)    rectal cancer, breast cancer 2025   Chronic pain syndrome 05/21/2023   DDD (degenerative disc disease), cervical 05/22/2023   Deep venous thrombosis (HCC)    Depression    Dyspnea    Fibromyalgia    GERD (gastroesophageal reflux disease)    Headache    Heart murmur    History of hiatal hernia    History of kidney stones    Lupus    Malignant neoplasm of upper-outer quadrant of right female breast, unspecified estrogen receptor status (HCC) 2025   Pneumonia    PTSD (post-traumatic stress disorder)    Skin candidiasis 05/29/2024   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    SURGICAL HISTORY: Past Surgical History:  Procedure  Laterality Date   BREAST BIOPSY Right 06/18/2024   US  RT BREAST BX W LOC DEV 1ST LESION IMG BX SPEC US  GUIDE 06/18/2024 [facility]   BREAST BIOPSY Right 06/18/2024   US  RT BREAST BX W LOC DEV EA ADD LESION IMG BX SPEC US  GUIDE 06/18/2024 [facility]   BREAST BIOPSY Right 06/18/2024   US  RT BREAST BX W LOC DEV EA ADD LESION IMG BX SPEC  US  GUIDE 06/18/2024 [facility]   BREAST BIOPSY Right 07/05/2024   US  RT BREAST SAVI/RF TAG 1ST LESION US  GUIDE 07/05/2024 [facility]   BREAST BIOPSY Right 07/05/2024   US  RT BREAST SAVI/RF TAG EA ADD'L LESION US  GUIDE 07/05/2024 [facility]   BREAST LUMPECTOMY Right 07/25/2024   Procedure: BREAST LUMPECTOMY;  Surgeon: Tye Millet, DO;  Location: [facility];  Service: General;  Laterality: Right;   BREAST LUMPECTOMY WITH RADIO FREQUENCY LOCALIZER Right 07/08/2024   Procedure: BREAST LUMPECTOMY WITH RADIO FREQUENCY LOCALIZER;  Surgeon: Tye Millet, DO;  Location: [facility];  Service: General;  Laterality: Right;   COLONOSCOPY WITH PROPOFOL  N/A 02/17/2023   Procedure: COLONOSCOPY WITH PROPOFOL ;  Surgeon: Maryruth Ole DASEN, MD;  Location: [facility];  Service: Endoscopy;  Laterality: N/A;   ESOPHAGOGASTRODUODENOSCOPY (EGD) WITH PROPOFOL  N/A 02/17/2023   Procedure: ESOPHAGOGASTRODUODENOSCOPY (EGD) WITH PROPOFOL ;  Surgeon: Maryruth Ole DASEN, MD;  Location: [facility];  Service: Endoscopy;  Laterality: N/A;   EXTRACORPOREAL SHOCK WAVE LITHOTRIPSY     IR CV LINE INJECTION  08/21/2024   IR REMOVAL TUN ACCESS W/ PORT W/O FL MOD SED  08/28/2024   PICC LINE INSERTION N/A 08/2024   PORTA CATH INSERTION N/A 10/14/2024   Procedure: PORTA CATH INSERTION;  Surgeon: Marea Selinda RAMAN, MD;  Location: [facility];  Service: Cardiovascular;  Laterality: N/A;   PORTA CATH REMOVAL N/A 10/14/2024   Procedure: PORTA CATH REMOVAL;  Surgeon: Marea Selinda RAMAN, MD;  Location: [facility];  Service: Cardiovascular;  Laterality: N/A;   PORTACATH  PLACEMENT Right 07/08/2024   Procedure: INSERTION, TUNNELED CENTRAL VENOUS DEVICE, WITH PORT;  Surgeon: Tye Millet, DO;  Location: [facility];  Service: General;  Laterality: Right;   RECTAL EXAM UNDER ANESTHESIA N/A 05/16/2024   Procedure: EXAM UNDER ANESTHESIA, RECTUM, INCISIONAL BIOPSY OF ANAL MASS;  Surgeon: Tye Millet, DO;  Location: [facility];  Service: General;  Laterality: N/A;   SENTINEL NODE BIOPSY Right 07/08/2024   Procedure: BIOPSY, LYMPH NODE, SENTINEL;  Surgeon: Tye Millet, DO;  Location: [facility];  Service: General;  Laterality: Right;   TONSILLECTOMY     TUBAL LIGATION     VULVECTOMY N/A 07/29/2020   Procedure: WIDE EXCISION VULVECTOMY;  Surgeon: Mancil Barter, MD;  Location: [facility];  Service: Gynecology;  Laterality: N/A;    SOCIAL HISTORY: Social History   Socioeconomic History   Marital status: Divorced    Spouse name: Not on file   Number of children: 1   Years of education: Not on file   Highest education level: Not on file  Occupational History   Not on file  Tobacco Use   Smoking status: Former    Types: Cigarettes   Smokeless tobacco: Never  Vaping Use   Vaping status: Never Used  Substance and Sexual Activity   Alcohol use: Not Currently   Drug use: No   Sexual activity: Yes  Other Topics Concern   Not on file  Social History Narrative   Not on file   Social Drivers of Health   Tobacco Use: Medium Risk (12/18/2024)   Patient History    Smoking Tobacco Use: Former    Smokeless Tobacco Use: Never    Passive Exposure: Not on file  Financial Resource Strain: Low Risk  (11/29/2024)   Received from [facility]   Overall Financial Resource Strain (CARDIA)    Difficulty of Paying Living Expenses: Not hard at all  Food Insecurity: No Food Insecurity (12/13/2024)   Epic  Worried About Programme Researcher, Broadcasting/film/video in the Last Year: Never true    Ran Out of Food in the Last Year: Never true  Transportation Needs: No Transportation  Needs (12/13/2024)   Epic    Lack of Transportation (Medical): No    Lack of Transportation (Non-Medical): No  Physical Activity: Not on file  Stress: Not on file  Social Connections: Not on file  Intimate Partner Violence: Not At Risk (12/13/2024)   Epic    Fear of Current or Ex-Partner: No    Emotionally Abused: No    Physically Abused: No    Sexually Abused: No  Depression (PHQ2-9): Low Risk (12/18/2024)   Depression (PHQ2-9)    PHQ-2 Score: 0  Alcohol Screen: Low Risk (10/16/2024)   Alcohol Screen    Last Alcohol Screening Score (AUDIT): 0  Housing: Low Risk (12/13/2024)   Epic    Unable to Pay for Housing in the Last Year: No    Number of Times Moved in the Last Year: 0    Homeless in the Last Year: No  Utilities: Not At Risk (12/13/2024)   Epic    Threatened with loss of utilities: No  Health Literacy: Not on file    FAMILY HISTORY: Family History  Problem Relation Age of Onset   Diabetes Mother    Congestive Heart Failure Mother    Colon cancer Father 72   Lupus Maternal Grandmother    Cancer Maternal Grandfather        either prostate or colon   Colon cancer Paternal Grandfather    Lung cancer Paternal Grandfather     ALLERGIES:  is allergic to prednisone and silicone.  MEDICATIONS:  Current Outpatient Medications  Medication Sig Dispense Refill   acetaminophen  (TYLENOL ) 500 MG tablet Take 500 mg by mouth every 6 (six) hours as needed.     albuterol  (VENTOLIN  HFA) 108 (90 Base) MCG/ACT inhaler Inhale 2 puffs into the lungs every 4 (four) hours as needed for wheezing or shortness of breath. 8.5 g 2   alum & mag hydroxide-simeth-nystatin -diphenhydrAMINE  Take 5 mLs by mouth 3 (three) times daily for 15 days. Suspension contains equal amounts of Maalox Extra Strength, nystatin , and diphenhydramine . 225 mL 0   ascorbic acid (VITAMIN C) 500 MG tablet Take 500 mg by mouth daily.     azelastine  (ASTELIN ) 0.1 % nasal spray Place 1 spray into both nostrils 2 (two) times  daily. Use in each nostril as directed 30 mL 0   benzonatate  (TESSALON ) 200 MG capsule Take 1 capsule (200 mg total) by mouth 3 (three) times daily as needed for cough. 30 capsule 1   BIOTIN PO Take by mouth daily.     budesonide  (PULMICORT ) 0.25 MG/2ML nebulizer solution Take 2 mLs (0.25 mg total) by nebulization 2 (two) times daily. 60 mL 12   busPIRone  (BUSPAR ) 5 MG tablet Take 5 mg by mouth 2 (two) times daily.     chlorhexidine  (PERIDEX ) 0.12 % solution Use as directed 10 mLs in the mouth or throat 2 (two) times daily. 473 mL 0   cholecalciferol (VITAMIN D3) 25 MCG (1000 UNIT) tablet Take 1,000 Units by mouth daily.     clonazePAM  (KLONOPIN ) 0.5 MG tablet Take 0.5 mg by mouth at bedtime as needed for anxiety.     dexamethasone  (DECADRON ) 4 MG tablet Take 2 tablets (8 mg total) by mouth daily for 2 days. Start the day after doxorubicin /cyclophosphamide  chemotherapy. Take with food. 30 tablet 1   diphenoxylate -atropine  (LOMOTIL ) 2.5-0.025  MG tablet Take 1 tablet by mouth 4 (four) times daily. 90 tablet 0   DULoxetine  (CYMBALTA ) 60 MG capsule Take 1 capsule (60 mg total) by mouth at bedtime. 30 capsule 2   enoxaparin  (LOVENOX ) 80 MG/0.8ML injection Inject 0.8 mLs (80 mg total) into the skin every 12 (twelve) hours. 48 mL 2   esomeprazole  (NEXIUM ) 40 MG capsule Take 1 capsule (40 mg total) by mouth daily. 90 capsule 1   gabapentin  (NEURONTIN ) 300 MG capsule Take 2 capsules (600 mg total) by mouth 3 (three) times daily. 180 capsule 2   ibuprofen  (ADVIL ) 200 MG tablet Take 200 mg by mouth every 6 (six) hours as needed.     ipratropium-albuterol  (DUONEB) 0.5-2.5 (3) MG/3ML SOLN Take 3 mLs by nebulization 4 (four) times daily. 360 mL 3   lactose free nutrition (BOOST PLUS) LIQD Take 237 mLs by mouth 2 (two) times daily between meals. 237 mL 11   lidocaine -prilocaine  (EMLA ) cream Apply 1 Application topically as needed. 30 g 3   linaclotide (LINZESS) 145 MCG CAPS capsule Take 145 mcg by mouth daily  before breakfast.     LORazepam  (ATIVAN ) 0.5 MG tablet Take 1 tablet (0.5 mg total) by mouth every 12 (twelve) hours as needed for anxiety (nausea). 30 tablet 0   megestrol  (MEGACE ) 40 MG tablet Take 2 tablets (80 mg total) by mouth 2 (two) times daily. 120 tablet 0   montelukast  (SINGULAIR ) 10 MG tablet Take 1 tablet (10 mg total) by mouth See admin instructions. Take 1 tablet on the day prior to chemotherapy and take 1 tablet daily for 2 days after chemotherapy. 30 tablet 0   Multiple Vitamin (MULTIVITAMIN WITH MINERALS) TABS tablet Take 1 tablet by mouth daily.     nystatin  (MYCOSTATIN ) 100000 UNIT/ML suspension Take 5 mLs (500,000 Units total) by mouth 4 (four) times daily. Swish and swallow 473 mL 1   potassium chloride  SA (KLOR-CON  M) 20 MEQ tablet Take 1 tablet (20 mEq total) by mouth 2 (two) times daily. 60 tablet 0   prochlorperazine  (COMPAZINE ) 10 MG tablet Take 1 tablet (10 mg total) by mouth every 6 (six) hours as needed for nausea or vomiting. 30 tablet 1   promethazine  (PHENERGAN ) 25 MG tablet Take 1 tablet (25 mg total) by mouth every 8 (eight) hours as needed for nausea or vomiting. 60 tablet 0   propranolol  (INDERAL ) 20 MG tablet Take 1 tablet (20 mg total) by mouth 2 (two) times daily. 60 tablet 2   traMADol  (ULTRAM ) 50 MG tablet Take 0.5 tablets (25 mg total) by mouth every 6 (six) hours as needed. 30 tablet 0   traZODone  (DESYREL ) 100 MG tablet Take 100 mg by mouth at bedtime.     naloxone  (NARCAN ) nasal spray 4 mg/0.1 mL SPRAY 1 SPRAY INTO ONE NOSTRIL AS DIRECTED FOR OPIOID OVERDOSE (TURN PERSON ON SIDE AFTER DOSE. IF NO RESPONSE IN 2-3 MINUTES OR PERSON RESPONDS BUT RELAPSES, REPEAT USING A NEW SPRAY DEVICE AND SPRAY INTO THE OTHER NOSTRIL. CALL 911 AFTER USE.) * EMERGENCY USE ONLY * (Patient not taking: Reported on 12/18/2024) 1 each 0   No current facility-administered medications for this visit.   Facility-Administered Medications Ordered in Other Visits  Medication Dose Route  Frequency Provider Last Rate Last Admin   ondansetron  (ZOFRAN ) injection 4 mg  4 mg Intravenous Once Lanell Jacobsen, NP        Review of Systems  Constitutional:  Positive for appetite change and fatigue. Negative for chills, fever  and unexpected weight change.  HENT:   Negative for hearing loss and voice change.        Tooth pain  Eyes:  Negative for eye problems.  Respiratory:  Negative for chest tightness, cough and shortness of breath.   Cardiovascular:  Negative for chest pain.  Gastrointestinal:  Positive for nausea. Negative for abdominal distention, abdominal pain, blood in stool and vomiting.  Endocrine: Negative for hot flashes.  Genitourinary:  Negative for difficulty urinating, dysuria and frequency.   Musculoskeletal:  Positive for neck pain. Negative for arthralgias.  Skin:  Negative for itching and rash.  Neurological:  Positive for numbness. Negative for extremity weakness.  Hematological:  Negative for adenopathy.  Psychiatric/Behavioral:  Negative for confusion.      PHYSICAL EXAMINATION: ECOG PERFORMANCE STATUS: 1 - Symptomatic but completely ambulatory  Vitals:   12/18/24 0932  BP: 104/75  Pulse: 95  Resp: 16  Temp: 99.4 F (37.4 C)  SpO2: 99%   Filed Weights   12/18/24 0932  Weight: 186 lb (84.4 kg)    Physical Exam Constitutional:      General: She is not in acute distress.    Appearance: She is not diaphoretic.  HENT:     Head: Normocephalic and atraumatic.     Mouth/Throat:     Pharynx: No oropharyngeal exudate.  Eyes:     General: No scleral icterus. Cardiovascular:     Rate and Rhythm: Normal rate and regular rhythm.     Heart sounds: No murmur heard. Pulmonary:     Effort: Pulmonary effort is normal. No respiratory distress.     Breath sounds: No wheezing.  Abdominal:     General: There is no distension.     Palpations: Abdomen is soft.     Tenderness: There is no abdominal tenderness.  Musculoskeletal:        General: Normal  range of motion.     Cervical back: Normal range of motion and neck supple.  Skin:    General: Skin is warm and dry.     Findings: No erythema.  Neurological:     Mental Status: She is alert and oriented to person, place, and time. Mental status is at baseline.     Motor: No abnormal muscle tone.  Psychiatric:        Mood and Affect: Affect normal.         LABORATORY DATA:  I have reviewed the data as listed    Latest Ref Rng & Units 12/18/2024    9:15 AM 12/11/2024    1:06 PM 12/04/2024    1:25 PM  CBC  WBC 4.0 - 10.5 K/uL 1.8  6.3  8.3   Hemoglobin 12.0 - 15.0 g/dL 8.8  9.2  8.6   Hematocrit 36.0 - 46.0 % 27.5  29.3  26.8   Platelets 150 - 400 K/uL 142  416  125    Lab Results  Component Value Date   NEUTROABS 1.4 (L) 12/18/2024       Latest Ref Rng & Units 12/18/2024    9:15 AM 12/11/2024    1:06 PM 12/04/2024    1:25 PM  CMP  Glucose 70 - 99 mg/dL 845  848  94   BUN 6 - 20 mg/dL 12  8  8    Creatinine 0.44 - 1.00 mg/dL 9.22  9.16  9.21   Sodium 135 - 145 mmol/L 139  139  140   Potassium 3.5 - 5.1 mmol/L 3.9  3.8  4.3  Chloride 98 - 111 mmol/L 106  108  109   CO2 22 - 32 mmol/L 19  19  21    Calcium 8.9 - 10.3 mg/dL 9.1  9.3  9.5   Total Protein 6.5 - 8.1 g/dL 6.0  6.4  6.0   Total Bilirubin 0.0 - 1.2 mg/dL 0.2  <9.7  0.3   Alkaline Phos 38 - 126 U/L 59  61  71   AST 15 - 41 U/L 21  16  17    ALT 0 - 44 U/L 23  13  16       RADIOGRAPHIC STUDIES: I have personally reviewed the radiological images as listed and agreed with the findings in the report. DG Abd 2 Views Result Date: 11/27/2024 CLINICAL DATA:  Abdominal pain EXAM: ABDOMEN - 2 VIEW COMPARISON:  12/24/2018, CT 08/17/2024 FINDINGS: Central venous catheter tip in the right atrium. No free air beneath the diaphragm. Nonobstructed gas pattern. Phleboliths in the pelvis. IMPRESSION: Nonobstructed gas pattern. Electronically Signed   By: Luke Bun M.D.   On: 11/27/2024 17:33    Assessment and Plan:  Breast  cancer (active treatment) with history of anal cancer (s/p treatment) Undergoing chemotherapy for breast cancer at this time with new palpable changes in anal region. Scheduled for digital rectal exam to evaluate for recurrence or complications. - Coordinated digital rectal exam with colorectal specialist.   Chemotherapy-induced adverse effects Experiencing fatigue, nausea, neuropathy, dysgeusia, and musculoskeletal pain. Bone pain likely from bone marrow stimulators. Nausea managed with antiemetics. Neuropathy co-managed with neurology. Pain management adjusted to minimize constipation. - Proceed with IVF bolus and IV zofran  today  - Continued Phenergan  and Compazine  as tolerated. - Continued Claritin for bone pain. - Prescribed tramadol  for musculoskeletal pain, discussed as alternative to oxycodone . - Continued follow-up with neurology for neuropathy.  Radiation-induced anal/rectal scarring Persistent tenderness and incontinence due to radiation-induced scarring. Scheduled for digital rectal exam to evaluate recurrence or complications. - Continued topical barrier creams for skin protection. - Follow-up with colorectal specialist for digital rectal exam.  Constipation and gastrointestinal symptoms Intermittent constipation and flatulence, possibly related to nutritional supplements. Stool suggests incomplete evacuation. - Prescribed simethicone  for gas relief.  Lactose intolerance Uses lactose-free milk alternatives and adjusts supplement intake to minimize gastrointestinal symptoms. - Continued lactose-free milk alternatives with nutritional supplements.  Continue to work with Diego RD as well   Neutropenia: - reviewed neutropenia precautions today, no s/sx of infection - s/p GCSF on 12/13/24 ANC 1.4 today continue to monitor today is day 7 post treat is expect counts to start improving from here  Anemia: -likely secondary to chemotherapy - endorses fatigue Hg 8.8 today continue to  monitor  -today is day 7 post treat is expect counts to start improving from here  Follow up plan:  Proceed with IVF bolus today F/U as scheduled next week for labs please add see [facility] with possible IVF LP    Morna Husband AGNP-C 12/18/24 "

## 2024-12-18 NOTE — Patient Instructions (Signed)
 Ondansetron Injection What is this medication? ONDANSETRON (on DAN se tron) prevents nausea and vomiting from chemotherapy, radiation, or surgery. It works by blocking substances in the body that may cause nausea or vomiting. It belongs to a class of medications called antiemetics. This medicine may be used for other purposes; ask your health care provider or pharmacist if you have questions. COMMON BRAND NAME(S): Zofran, Zofran in Dextrose, Zofran Solution What should I tell my care team before I take this medication? They need to know if you have any of these conditions: Heart disease History of irregular heartbeat Liver disease Low levels of magnesium or potassium in the blood An unusual or allergic reaction to ondansetron, granisetron, other medications, foods, dyes, or preservatives Pregnant or trying to get pregnant Breast-feeding How should I use this medication? This medication is injected into a vein. It is given by your care team in a hospital or clinic setting. Talk to your care team about the use of this medication in children. Special care may be needed. Overdosage: If you think you have taken too much of this medicine contact a poison control center or emergency room at once. NOTE: This medicine is only for you. Do not share this medicine with others. What if I miss a dose? This does not apply. What may interact with this medication? Do not take this medication with any of the following: Apomorphine Certain medications for fungal infections, such as fluconazole, itraconazole, ketoconazole, posaconazole, voriconazole Cisapride Dronedarone Pimozide Thioridazine This medication may also interact with the following: Carbamazepine Certain medications for depression, anxiety, or mental health conditions Fentanyl Linezolid MAOIs, such as Carbex, Eldepryl, Marplan, Nardil, and Parnate Methylene blue (injected into a vein) Other medications that cause heart rhythm changes,  such as dofetilide, ziprasidone Phenytoin Rifampicin Tramadol This list may not describe all possible interactions. Give your health care provider a list of all the medicines, herbs, non-prescription drugs, or dietary supplements you use. Also tell them if you smoke, drink alcohol, or use illegal drugs. Some items may interact with your medicine. What should I watch for while using this medication? Your condition will be monitored carefully while you are receiving this medication. What side effects may I notice from receiving this medication? Side effects that you should report to your care team as soon as possible: Allergic reactions--skin rash, itching, hives, swelling of the face, lips, tongue, or throat Bowel blockage--stomach cramping, unable to have a bowel movement or pass gas, loss of appetite, vomiting Chest pain (angina)--pain, pressure, or tightness in the chest, neck, back, or arms Heart rhythm changes--fast or irregular heartbeat, dizziness, feeling faint or lightheaded, chest pain, trouble breathing Irritability, confusion, fast or irregular heartbeat, muscle stiffness, twitching muscles, sweating, high fever, seizure, chills, vomiting, diarrhea, which may be signs of serotonin syndrome Side effects that usually do not require medical attention (report to your care team if they continue or are bothersome): Constipation Diarrhea General discomfort and fatigue Headache This list may not describe all possible side effects. Call your doctor for medical advice about side effects. You may report side effects to FDA at 1-800-FDA-1088. Where should I keep my medication? This medication is given in a hospital or clinic and will not be stored at home. NOTE: This sheet is a summary. It may not cover all possible information. If you have questions about this medicine, talk to your doctor, pharmacist, or health care provider.  2024 [employer]/Gold Standard (2022-03-31 00:00:00)

## 2024-12-24 ENCOUNTER — Inpatient Hospital Stay: Admitting: Nurse Practitioner

## 2024-12-24 ENCOUNTER — Inpatient Hospital Stay

## 2025-01-01 ENCOUNTER — Inpatient Hospital Stay

## 2025-01-01 ENCOUNTER — Inpatient Hospital Stay: Admitting: Oncology

## 2025-01-02 ENCOUNTER — Ambulatory Visit: Admitting: Pulmonary Disease

## 2025-01-03 ENCOUNTER — Inpatient Hospital Stay

## 2025-01-07 ENCOUNTER — Ambulatory Visit: Admitting: Pulmonary Disease

## 2025-02-07 ENCOUNTER — Inpatient Hospital Stay: Admitting: Internal Medicine

## 2025-02-20 ENCOUNTER — Ambulatory Visit

## 2025-03-06 ENCOUNTER — Ambulatory Visit: Admitting: Radiation Oncology
